# Patient Record
Sex: Female | Born: 1940 | Race: White | Hispanic: No | Smoking: Former smoker
Health system: Southern US, Community
[De-identification: ages and names within clinical notes are randomized; demographics above are authoritative.]

## PROBLEM LIST (undated history)

## (undated) ENCOUNTER — Emergency Department (HOSPITAL_COMMUNITY): Discharge status: Against Medical Advice | Payer: Medicare Other

## (undated) DIAGNOSIS — I499 Cardiac arrhythmia, unspecified: Secondary | ICD-10-CM

## (undated) DIAGNOSIS — N83209 Unspecified ovarian cyst, unspecified side: Secondary | ICD-10-CM

## (undated) DIAGNOSIS — C189 Malignant neoplasm of colon, unspecified: Secondary | ICD-10-CM

## (undated) DIAGNOSIS — F17201 Nicotine dependence, unspecified, in remission: Secondary | ICD-10-CM

## (undated) DIAGNOSIS — M1990 Unspecified osteoarthritis, unspecified site: Secondary | ICD-10-CM

## (undated) DIAGNOSIS — E785 Hyperlipidemia, unspecified: Secondary | ICD-10-CM

## (undated) DIAGNOSIS — K219 Gastro-esophageal reflux disease without esophagitis: Secondary | ICD-10-CM

## (undated) DIAGNOSIS — R112 Nausea with vomiting, unspecified: Secondary | ICD-10-CM

## (undated) DIAGNOSIS — K3184 Gastroparesis: Secondary | ICD-10-CM

## (undated) DIAGNOSIS — Z9889 Other specified postprocedural states: Secondary | ICD-10-CM

## (undated) HISTORY — DX: Hyperlipidemia, unspecified: E78.5

## (undated) HISTORY — DX: Paroxysmal atrial fibrillation: I48.0

## (undated) HISTORY — PX: CHOLECYSTECTOMY: SHX55

## (undated) HISTORY — DX: Gastroparesis: K31.84

## (undated) HISTORY — DX: Malignant neoplasm of colon, unspecified: C18.9

## (undated) HISTORY — PX: TONSILLECTOMY: SUR1361

## (undated) HISTORY — DX: Gastro-esophageal reflux disease without esophagitis: K21.9

## (undated) HISTORY — DX: Unspecified ovarian cyst, unspecified side: N83.209

## (undated) HISTORY — DX: Nicotine dependence, unspecified, in remission: F17.201

## (undated) HISTORY — PX: BACK SURGERY: SHX140

## (undated) HISTORY — DX: Cardiac arrhythmia, unspecified: I49.9

---

## 1981-05-11 HISTORY — PX: ABDOMINAL HYSTERECTOMY: SHX81

## 1997-10-16 ENCOUNTER — Inpatient Hospital Stay (HOSPITAL_COMMUNITY): Admission: EM | Admit: 1997-10-16 | Discharge: 1997-10-18 | Payer: Self-pay | Admitting: Emergency Medicine

## 2001-01-17 ENCOUNTER — Ambulatory Visit (HOSPITAL_COMMUNITY): Admission: RE | Admit: 2001-01-17 | Discharge: 2001-01-17 | Payer: Self-pay | Admitting: Internal Medicine

## 2001-03-10 ENCOUNTER — Ambulatory Visit (HOSPITAL_COMMUNITY): Admission: RE | Admit: 2001-03-10 | Discharge: 2001-03-10 | Payer: Self-pay | Admitting: Internal Medicine

## 2001-03-10 ENCOUNTER — Encounter: Payer: Self-pay | Admitting: Internal Medicine

## 2001-05-02 ENCOUNTER — Encounter: Payer: Self-pay | Admitting: Internal Medicine

## 2001-05-02 ENCOUNTER — Ambulatory Visit (HOSPITAL_COMMUNITY): Admission: RE | Admit: 2001-05-02 | Discharge: 2001-05-02 | Payer: Self-pay | Admitting: Internal Medicine

## 2001-11-24 ENCOUNTER — Observation Stay (HOSPITAL_COMMUNITY): Admission: EM | Admit: 2001-11-24 | Discharge: 2001-11-25 | Payer: Self-pay | Admitting: Cardiology

## 2001-11-24 ENCOUNTER — Encounter: Payer: Self-pay | Admitting: Emergency Medicine

## 2002-05-05 ENCOUNTER — Ambulatory Visit (HOSPITAL_COMMUNITY): Admission: RE | Admit: 2002-05-05 | Discharge: 2002-05-05 | Payer: Self-pay | Admitting: Internal Medicine

## 2002-05-05 ENCOUNTER — Encounter: Payer: Self-pay | Admitting: Internal Medicine

## 2002-07-07 ENCOUNTER — Ambulatory Visit (HOSPITAL_COMMUNITY): Admission: RE | Admit: 2002-07-07 | Discharge: 2002-07-07 | Payer: Self-pay | Admitting: Internal Medicine

## 2003-01-05 ENCOUNTER — Ambulatory Visit (HOSPITAL_COMMUNITY): Admission: RE | Admit: 2003-01-05 | Discharge: 2003-01-05 | Payer: Self-pay | Admitting: Internal Medicine

## 2003-01-05 ENCOUNTER — Encounter: Payer: Self-pay | Admitting: Internal Medicine

## 2003-05-07 ENCOUNTER — Ambulatory Visit (HOSPITAL_COMMUNITY): Admission: RE | Admit: 2003-05-07 | Discharge: 2003-05-07 | Payer: Self-pay | Admitting: Internal Medicine

## 2003-07-05 ENCOUNTER — Inpatient Hospital Stay (HOSPITAL_COMMUNITY): Admission: EM | Admit: 2003-07-05 | Discharge: 2003-07-06 | Payer: Self-pay | Admitting: Emergency Medicine

## 2003-07-05 ENCOUNTER — Encounter: Payer: Self-pay | Admitting: Internal Medicine

## 2003-08-28 ENCOUNTER — Observation Stay (HOSPITAL_COMMUNITY): Admission: AD | Admit: 2003-08-28 | Discharge: 2003-08-29 | Payer: Self-pay | Admitting: Cardiology

## 2003-08-28 ENCOUNTER — Encounter: Payer: Self-pay | Admitting: Emergency Medicine

## 2004-05-11 HISTORY — PX: PARTIAL COLECTOMY: SHX5273

## 2004-05-11 HISTORY — PX: COLON SURGERY: SHX602

## 2004-05-26 ENCOUNTER — Ambulatory Visit (HOSPITAL_COMMUNITY): Admission: RE | Admit: 2004-05-26 | Discharge: 2004-05-26 | Payer: Self-pay | Admitting: Internal Medicine

## 2004-12-11 ENCOUNTER — Ambulatory Visit: Payer: Self-pay | Admitting: Internal Medicine

## 2004-12-18 ENCOUNTER — Ambulatory Visit: Payer: Self-pay

## 2005-01-22 ENCOUNTER — Ambulatory Visit: Payer: Self-pay | Admitting: Cardiology

## 2005-03-05 ENCOUNTER — Ambulatory Visit (HOSPITAL_COMMUNITY): Admission: RE | Admit: 2005-03-05 | Discharge: 2005-03-05 | Payer: Self-pay | Admitting: Internal Medicine

## 2005-03-05 ENCOUNTER — Ambulatory Visit: Payer: Self-pay | Admitting: Internal Medicine

## 2005-03-06 ENCOUNTER — Encounter (INDEPENDENT_AMBULATORY_CARE_PROVIDER_SITE_OTHER): Payer: Self-pay | Admitting: Internal Medicine

## 2005-03-11 DIAGNOSIS — C189 Malignant neoplasm of colon, unspecified: Secondary | ICD-10-CM

## 2005-03-11 HISTORY — DX: Malignant neoplasm of colon, unspecified: C18.9

## 2005-03-27 ENCOUNTER — Inpatient Hospital Stay (HOSPITAL_COMMUNITY): Admission: RE | Admit: 2005-03-27 | Discharge: 2005-04-04 | Payer: Self-pay | Admitting: General Surgery

## 2005-03-27 ENCOUNTER — Encounter (INDEPENDENT_AMBULATORY_CARE_PROVIDER_SITE_OTHER): Payer: Self-pay | Admitting: General Surgery

## 2005-03-30 ENCOUNTER — Ambulatory Visit: Payer: Self-pay | Admitting: Cardiology

## 2005-05-29 ENCOUNTER — Ambulatory Visit (HOSPITAL_COMMUNITY): Admission: RE | Admit: 2005-05-29 | Discharge: 2005-05-29 | Payer: Self-pay | Admitting: Family Medicine

## 2005-07-06 ENCOUNTER — Ambulatory Visit (HOSPITAL_COMMUNITY): Admission: RE | Admit: 2005-07-06 | Discharge: 2005-07-06 | Payer: Self-pay | Admitting: Family Medicine

## 2005-07-07 ENCOUNTER — Ambulatory Visit (HOSPITAL_COMMUNITY): Admission: RE | Admit: 2005-07-07 | Discharge: 2005-07-07 | Payer: Self-pay | Admitting: General Surgery

## 2005-07-20 ENCOUNTER — Ambulatory Visit: Payer: Self-pay | Admitting: Cardiology

## 2005-11-16 ENCOUNTER — Observation Stay (HOSPITAL_COMMUNITY): Admission: EM | Admit: 2005-11-16 | Discharge: 2005-11-18 | Payer: Self-pay | Admitting: Emergency Medicine

## 2005-11-17 ENCOUNTER — Ambulatory Visit: Payer: Self-pay | Admitting: Cardiology

## 2005-11-26 ENCOUNTER — Encounter (HOSPITAL_COMMUNITY): Admission: RE | Admit: 2005-11-26 | Discharge: 2005-12-26 | Payer: Self-pay | Admitting: Cardiology

## 2005-11-26 ENCOUNTER — Ambulatory Visit: Payer: Self-pay | Admitting: *Deleted

## 2005-12-02 ENCOUNTER — Ambulatory Visit: Payer: Self-pay | Admitting: Internal Medicine

## 2005-12-29 ENCOUNTER — Ambulatory Visit: Payer: Self-pay | Admitting: Cardiology

## 2005-12-30 ENCOUNTER — Ambulatory Visit: Payer: Self-pay | Admitting: Internal Medicine

## 2006-03-01 ENCOUNTER — Ambulatory Visit: Payer: Self-pay | Admitting: Cardiology

## 2006-03-05 ENCOUNTER — Ambulatory Visit: Payer: Self-pay | Admitting: Internal Medicine

## 2006-03-05 ENCOUNTER — Ambulatory Visit (HOSPITAL_COMMUNITY): Admission: RE | Admit: 2006-03-05 | Discharge: 2006-03-05 | Payer: Self-pay | Admitting: Internal Medicine

## 2006-05-11 DIAGNOSIS — N83209 Unspecified ovarian cyst, unspecified side: Secondary | ICD-10-CM

## 2006-05-11 HISTORY — DX: Unspecified ovarian cyst, unspecified side: N83.209

## 2006-05-25 ENCOUNTER — Ambulatory Visit: Payer: Self-pay | Admitting: Internal Medicine

## 2006-05-31 ENCOUNTER — Ambulatory Visit (HOSPITAL_COMMUNITY): Admission: RE | Admit: 2006-05-31 | Discharge: 2006-05-31 | Payer: Self-pay | Admitting: Internal Medicine

## 2006-06-01 ENCOUNTER — Ambulatory Visit (HOSPITAL_COMMUNITY): Admission: RE | Admit: 2006-06-01 | Discharge: 2006-06-01 | Payer: Self-pay | Admitting: Family Medicine

## 2006-06-07 ENCOUNTER — Ambulatory Visit (HOSPITAL_COMMUNITY): Admission: RE | Admit: 2006-06-07 | Discharge: 2006-06-07 | Payer: Self-pay | Admitting: Internal Medicine

## 2006-08-16 ENCOUNTER — Observation Stay (HOSPITAL_COMMUNITY): Admission: AD | Admit: 2006-08-16 | Discharge: 2006-08-17 | Payer: Self-pay | Admitting: Family Medicine

## 2006-08-16 ENCOUNTER — Ambulatory Visit: Payer: Self-pay | Admitting: Cardiovascular Disease

## 2006-08-24 ENCOUNTER — Ambulatory Visit: Payer: Self-pay | Admitting: Cardiology

## 2006-12-07 ENCOUNTER — Ambulatory Visit (HOSPITAL_COMMUNITY): Admission: RE | Admit: 2006-12-07 | Discharge: 2006-12-07 | Payer: Self-pay | Admitting: Family Medicine

## 2006-12-15 ENCOUNTER — Ambulatory Visit (HOSPITAL_COMMUNITY): Admission: RE | Admit: 2006-12-15 | Discharge: 2006-12-15 | Payer: Self-pay | Admitting: Family Medicine

## 2007-02-25 ENCOUNTER — Ambulatory Visit: Payer: Self-pay | Admitting: Cardiology

## 2007-06-03 ENCOUNTER — Ambulatory Visit (HOSPITAL_COMMUNITY): Admission: RE | Admit: 2007-06-03 | Discharge: 2007-06-03 | Payer: Self-pay | Admitting: Family Medicine

## 2007-07-27 ENCOUNTER — Ambulatory Visit (HOSPITAL_COMMUNITY): Admission: RE | Admit: 2007-07-27 | Discharge: 2007-07-27 | Payer: Self-pay | Admitting: Family Medicine

## 2007-09-09 HISTORY — PX: COSMETIC SURGERY: SHX468

## 2007-09-19 ENCOUNTER — Ambulatory Visit: Payer: Self-pay | Admitting: Cardiology

## 2007-09-26 ENCOUNTER — Ambulatory Visit (HOSPITAL_COMMUNITY): Admission: RE | Admit: 2007-09-26 | Discharge: 2007-09-26 | Payer: Self-pay | Admitting: Family Medicine

## 2007-11-08 ENCOUNTER — Inpatient Hospital Stay (HOSPITAL_COMMUNITY): Admission: EM | Admit: 2007-11-08 | Discharge: 2007-11-13 | Payer: Self-pay | Admitting: Emergency Medicine

## 2008-06-04 ENCOUNTER — Ambulatory Visit (HOSPITAL_COMMUNITY): Admission: RE | Admit: 2008-06-04 | Discharge: 2008-06-04 | Payer: Self-pay | Admitting: Family Medicine

## 2008-06-19 ENCOUNTER — Ambulatory Visit: Payer: Self-pay | Admitting: Cardiology

## 2009-03-14 ENCOUNTER — Ambulatory Visit (HOSPITAL_COMMUNITY): Admission: RE | Admit: 2009-03-14 | Discharge: 2009-03-14 | Payer: Self-pay | Admitting: Family Medicine

## 2009-03-14 ENCOUNTER — Encounter (INDEPENDENT_AMBULATORY_CARE_PROVIDER_SITE_OTHER): Payer: Self-pay | Admitting: *Deleted

## 2009-03-14 LAB — CONVERTED CEMR LAB
AST: 20 units/L
Albumin: 4.6 g/dL
BUN: 20 mg/dL
CO2: 25 meq/L
Calcium: 9.4 mg/dL
Chloride: 105 meq/L
Creatinine, Ser: 0.74 mg/dL
Glucose, Bld: 97 mg/dL
HDL: 56 mg/dL
Potassium: 4.2 meq/L
TSH: 1.43 microintl units/mL

## 2009-05-09 ENCOUNTER — Encounter (INDEPENDENT_AMBULATORY_CARE_PROVIDER_SITE_OTHER): Payer: Self-pay | Admitting: *Deleted

## 2009-05-09 LAB — CONVERTED CEMR LAB
ALT: 9 units/L
AST: 24 units/L
Alkaline Phosphatase: 64 units/L
BUN: 15 mg/dL
Calcium: 9.4 mg/dL
Cholesterol: 224 mg/dL
Creatinine, Ser: 0.79 mg/dL
HDL: 71 mg/dL
LDL Cholesterol: 112 mg/dL
Triglycerides: 207 mg/dL

## 2009-05-29 ENCOUNTER — Encounter: Payer: Self-pay | Admitting: Cardiology

## 2009-06-07 ENCOUNTER — Ambulatory Visit (HOSPITAL_COMMUNITY): Admission: RE | Admit: 2009-06-07 | Discharge: 2009-06-07 | Payer: Self-pay | Admitting: Family Medicine

## 2009-06-12 ENCOUNTER — Encounter (INDEPENDENT_AMBULATORY_CARE_PROVIDER_SITE_OTHER): Payer: Self-pay | Admitting: *Deleted

## 2009-06-12 DIAGNOSIS — E785 Hyperlipidemia, unspecified: Secondary | ICD-10-CM

## 2009-06-24 ENCOUNTER — Ambulatory Visit: Payer: Self-pay | Admitting: Cardiology

## 2009-06-24 DIAGNOSIS — K219 Gastro-esophageal reflux disease without esophagitis: Secondary | ICD-10-CM

## 2009-06-24 DIAGNOSIS — R079 Chest pain, unspecified: Secondary | ICD-10-CM | POA: Insufficient documentation

## 2009-06-24 DIAGNOSIS — C189 Malignant neoplasm of colon, unspecified: Secondary | ICD-10-CM | POA: Insufficient documentation

## 2009-06-25 ENCOUNTER — Encounter: Payer: Self-pay | Admitting: Cardiology

## 2009-08-05 ENCOUNTER — Encounter: Payer: Self-pay | Admitting: Cardiology

## 2009-08-08 ENCOUNTER — Telehealth: Payer: Self-pay | Admitting: Cardiology

## 2009-08-14 ENCOUNTER — Encounter: Payer: Self-pay | Admitting: Cardiology

## 2009-08-22 ENCOUNTER — Encounter (INDEPENDENT_AMBULATORY_CARE_PROVIDER_SITE_OTHER): Payer: Self-pay | Admitting: *Deleted

## 2009-09-16 ENCOUNTER — Ambulatory Visit (HOSPITAL_COMMUNITY): Admission: RE | Admit: 2009-09-16 | Discharge: 2009-09-16 | Payer: Self-pay | Admitting: Family Medicine

## 2009-09-17 ENCOUNTER — Encounter: Payer: Self-pay | Admitting: Cardiology

## 2009-10-09 ENCOUNTER — Ambulatory Visit: Payer: Self-pay | Admitting: Cardiology

## 2010-01-23 ENCOUNTER — Encounter: Payer: Self-pay | Admitting: Cardiology

## 2010-01-23 LAB — CONVERTED CEMR LAB
ALT: 19 units/L
AST: 21 units/L
Albumin: 4.4 g/dL
Alkaline Phosphatase: 71 units/L
HDL: 56 mg/dL
LDL Cholesterol: 109 mg/dL

## 2010-03-27 ENCOUNTER — Ambulatory Visit (HOSPITAL_COMMUNITY)
Admission: RE | Admit: 2010-03-27 | Discharge: 2010-03-27 | Payer: Self-pay | Source: Home / Self Care | Admitting: Family Medicine

## 2010-04-15 ENCOUNTER — Encounter (INDEPENDENT_AMBULATORY_CARE_PROVIDER_SITE_OTHER): Payer: Self-pay | Admitting: *Deleted

## 2010-04-15 LAB — CONVERTED CEMR LAB
CO2: 24 meq/L
Calcium: 9.6 mg/dL
Chloride: 101 meq/L
Creatinine, Ser: 0.79 mg/dL
Glucose, Bld: 91 mg/dL

## 2010-06-09 ENCOUNTER — Ambulatory Visit (HOSPITAL_COMMUNITY)
Admission: RE | Admit: 2010-06-09 | Discharge: 2010-06-09 | Payer: Self-pay | Source: Home / Self Care | Attending: Family Medicine | Admitting: Family Medicine

## 2010-06-10 NOTE — Miscellaneous (Signed)
Summary: LABS BMP,LIPIDS,LIVER 03/14/2009  Clinical Lists Changes  Observations: Added new observation of CALCIUM: 9.4 mg/dL (81/19/1478 29:56) Added new observation of ALBUMIN: 4.6 g/dL (21/30/8657 84:69) Added new observation of PROTEIN, TOT: 7.0 g/dL (62/95/2841 32:44) Added new observation of SGPT (ALT): 16 units/L (03/14/2009 15:45) Added new observation of SGOT (AST): 20 units/L (03/14/2009 15:45) Added new observation of ALK PHOS: 74 units/L (03/14/2009 15:45) Added new observation of BILI DIRECT: <0.1 mg/dL (05/13/7251 66:44) Added new observation of CREATININE: 0.74 mg/dL (03/47/4259 56:38) Added new observation of BUN: 20 mg/dL (75/64/3329 51:88) Added new observation of BG RANDOM: 97 mg/dL (41/66/0630 16:01) Added new observation of CO2 PLSM/SER: 25 meq/L (03/14/2009 15:45) Added new observation of CL SERUM: 105 meq/L (03/14/2009 15:45) Added new observation of K SERUM: 4.2 meq/L (03/14/2009 15:45) Added new observation of NA: 143 meq/L (03/14/2009 15:45) Added new observation of LDL: 90 mg/dL (09/32/3557 32:20) Added new observation of HDL: 56 mg/dL (25/42/7062 37:62) Added new observation of TRIGLYC TOT: 198 mg/dL (83/15/1761 60:73) Added new observation of CHOLESTEROL: 186 mg/dL (71/10/2692 85:46) Added new observation of TSH: 1.430 microintl units/mL (03/14/2009 15:45)

## 2010-06-10 NOTE — Miscellaneous (Signed)
Summary: CMP,LIPIDS  Clinical Lists Changes  Observations: Added new observation of CALCIUM: 9.4 mg/dL (82/95/6213 08:65) Added new observation of ALBUMIN: 4.1 g/dL (78/46/9629 52:84) Added new observation of PROTEIN, TOT: 6.9 g/dL (13/24/4010 27:25) Added new observation of SGPT (ALT): 9 units/L (05/09/2009 16:38) Added new observation of SGOT (AST): 24 units/L (05/09/2009 16:38) Added new observation of ALK PHOS: 64 units/L (05/09/2009 16:38) Added new observation of BILI DIRECT: total bili   0.4 mg/dL (36/64/4034 74:25) Added new observation of CREATININE: 0.79 mg/dL (95/63/8756 43:32) Added new observation of BUN: 15 mg/dL (95/18/8416 60:63) Added new observation of BG RANDOM: 107 mg/dL (01/60/1093 23:55) Added new observation of CO2 PLSM/SER: 31 meq/L (05/09/2009 16:38) Added new observation of CL SERUM: 99 meq/L (05/09/2009 16:38) Added new observation of K SERUM: 4.1 meq/L (05/09/2009 16:38) Added new observation of NA: 142 meq/L (05/09/2009 16:38) Added new observation of LDL: 112 mg/dL (73/22/0254 27:06) Added new observation of HDL: 71 mg/dL (23/76/2831 51:76) Added new observation of TRIGLYC TOT: 207 mg/dL (16/11/3708 62:69) Added new observation of CHOLESTEROL: 224 mg/dL (48/54/6270 35:00)

## 2010-06-10 NOTE — Miscellaneous (Signed)
Summary: flecanide refill  Clinical Lists Changes  Medications: Rx of FLECAINIDE ACETATE 100 MG TABS (FLECAINIDE ACETATE) Take one tablet by mouth every 12 hours;  #60 x 6;  Signed;  Entered by: Teressa Lower RN;  Authorized by: Kathlen Brunswick, MD, [facility];  Method used: Electronically to [facility]*, [address] [place], [place], [place]  [address], Ph: [phone] or [phone], Fax: [phone]    Prescriptions: FLECAINIDE ACETATE 100 MG TABS (FLECAINIDE ACETATE) Take one tablet by mouth every 12 hours  #60 x 6   Entered by:   Teressa Lower RN   Authorized by:   Kathlen Brunswick, MD, [facility]   Signed by:   Teressa Lower RN on 08/05/2009   Method used:   Electronically to        [facility]* (retail)       [address]       [place], [place]  [address]       Ph: [phone] or [phone]       Fax: [phone]   RxID:   [id number]

## 2010-06-10 NOTE — Assessment & Plan Note (Signed)
Summary: 1 yr fu per checkout on 06/19/08/tg  Medications Added FISH OIL 1360 MG CAPS (OMEGA-3 FATTY ACIDS) take 1 tab daily      Allergies Added: NKDA  Visit Type:  Follow-up Referring Provider:  EP-Dr. Graciela Husbands; GI-Rehman;Surg.-Barber Primary Provider:  Dr.Scott Luking   History of Present Illness: Return visit for this very pleasant 70 year old woman with paroxysmal atrial fibrillation.  Since her last visit, she has had only occasional and brief palpitations.  She has seen advertisements for anticoagulants and wonders if she should be taking something to prevent stroke.  She has no dyspnea nor chest discomfort.  She has no claudication.  She has had no new medical problems nor required urgent medical care.  EKG  Procedure date:  06/24/2009  Findings:      Normal sinus rhythm Left atrial abnormality Incomplete right bundle branch block Delayed R-wave progression No significant change when compared to a previous tracing of 06/19/2008.   Current Medications (verified): 1)  Flecainide Acetate 100 Mg Tabs (Flecainide Acetate) .... Take One Tablet By Mouth Every 12 Hours 2)  Aspir-Trin 325 Mg Tbec (Aspirin) .... Take 1 Tab Daily 3)  Lipitor 40 Mg Tabs (Atorvastatin Calcium) .... Take 1 Tab Daily 4)  Daily-Vitamin  Tabs (Multiple Vitamin) .... Take 1 Tab Daily 5)  Caltrate 600 1500 Mg Tabs (Calcium Carbonate) .... Take 1 Tab Daily 6)  Lactulose  Powd (Lactulose) .... Take As Needed 7)  Fish Oil 1360 Mg Caps (Omega-3 Fatty Acids) .... Take 1 Tab Daily  Allergies (verified): No Known Drug Allergies  Past History:  PMH, FH, and Social History reviewed and updated.  Review of Systems       see history of present illness  Vital Signs:  Patient profile:   70 year old female Height:      62 inches Weight:      114 pounds BMI:     20.93 Pulse rate:   67 / minute BP sitting:   110 / 60  (right arm)  Vitals Entered By: Dreama Saa, CNA (June 24, 2009 1:47 PM)  Physical  Exam  General:  Pleasant, thin, woman in no acute distress. NECK:  No jugular venous distention; normal carotid upstrokes without bruits. LUNGS:  Clear. CARDIAC:  Normal first and second heart sounds; S4 present ABDOMEN:  Soft and nontender; no organomegaly. Neurologic: Normal cranial nerves; symmetric strength and tone Psychiatric: Alert and oriented; normal affect Skin: No significant lesions EXTREMITIES:  No edema; 1+ posterior tibial pulses; dorsalis pedis pulses cannot be located with Doppler nor palpated.   Impression & Recommendations:  Problem # 1:  PAROXYSMAL ATRIAL FIBRILLATION (ICD-427.31) No documented recurrent atrial fibrillation.  I explained to the patient that she appears to have no arrhythmia whatsoever, but that asymptomatic atrial fibrillation is possible.  Even if she is experiencing that arrhythmia, she has a low risk for thromboembolism and does not want treatment with anticoagulants.  Flecainide level was therapeutic at her last visit.  Her current dose appears effective and will be continued.  Problem # 2:  HYPERLIPIDEMIA (ICD-272.4) Recent lipid profile was fairly good; moderate dose atorvastatin will be continued.  Problem # 3:  CHEST PAIN (ICD-786.50) No recurrent chest discomfort; coronaries were normal at angiography in 1999.  Stress nuclear study was normal in 2007.  No further testing or treatment warranted at the present time.  Patient Instructions: 1)  Your physician recommends that you schedule a follow-up appointment in: 1 year

## 2010-06-10 NOTE — Letter (Signed)
Summary: [place] Results Engineer, agricultural at [facility]  [address], [place] [address]   Phone: [phone]  Fax: [phone]      August 22, 2009 MRN: [id number]   [facility] [address] [place], [place]  [address]   Dear Ms. Brisbane,  Your test ordered by Selena Batten has been reviewed by your physician (or physician assistant) and was found to be normal or stable. Your physician (or physician assistant) felt no changes were needed at this time.  ____ Echocardiogram  ____ Cardiac Stress Test  _x___ Lab Work  ____ Peripheral vascular study of arms, legs or neck  ____ CT scan or X-ray  ____ Lung or Breathing test  ____ Other:  No change in medical treatment at this time, per Dr. Dietrich Pates.  Enclosed is a copy of your labwork for your records.  Thank you, Destyne Goodreau Allyne Gee RN    [place] Bing, MD, [facility].C.Gaylord Shih, MD, F.A.C.C Lewayne Bunting, MD, F.A.C.C Nona Dell, MD, F.A.C.C Charlton Haws, MD, [facility] Arena.C.C

## 2010-06-10 NOTE — Progress Notes (Signed)
Summary: pt having problems   Phone Note Call from Patient   Caller: Patient Reason for Call: Talk to Nurse Summary of Call: pt states she has been having some irregular heartbeats/wants to be seen by Dr.Lakyn Mantione/told her nurse would call to assess her issues/tg Initial call taken by: Raechel Ache [facility],  August 08, 2009 2:28 PM  Follow-up for Phone Call        Pt. states that she normally has her Flecainide level checked with each ov but not on ov of  06-24-09. Takes Flecainide 100mg  q 12 hrs. She c/o irregular HB that occurs 3 to 4 times a week and wants to know if she needs that lab work performed or make an appt. to see Dr. Dietrich Pates. Please advise. Follow-up by: Larita Fife Via LPN,  August 09, 2009 9:08 AM  Additional Follow-up for Phone Call Additional follow up Details #1::        Flecainide level is not necessary at every visit, but is now that she symptomatic. Please obtain a level and provide her with a recorder for 3 weeks.  Schedule an appointment in one to 2 months. Additional Follow-up by: Kathlen Brunswick, MD, [facility],  August 13, 2009 8:43 AM    Additional Follow-up for Phone Call Additional follow up Details #2::    Pt. advised to have Flecainide level drawn tomorrow morning, that she will receive Event monitor in mail and scheduled appt. with Dr. Dietrich Pates on 10-09-09 @ 2:45pm. Pt. states she understands info. given. Follow-up by: Larita Fife Via LPN,  August 13, 2009 2:11 PM

## 2010-06-10 NOTE — Miscellaneous (Signed)
Summary: flecanide refill  Clinical Lists Changes  Medications: Added new medication of FLECAINIDE ACETATE 100 MG TABS (FLECAINIDE ACETATE) Take one tablet by mouth every 12 hours - Signed Rx of FLECAINIDE ACETATE 100 MG TABS (FLECAINIDE ACETATE) Take one tablet by mouth every 12 hours;  #60 x 1;  Signed;  Entered by: Teressa Lower RN;  Authorized by: Kathlen Brunswick, MD, [facility];  Method used: Electronically to [facility]*, [address] [place], [place], [place]  [address], Ph: [phone] or [phone], Fax: [phone]    Prescriptions: FLECAINIDE ACETATE 100 MG TABS (FLECAINIDE ACETATE) Take one tablet by mouth every 12 hours  #60 x 1   Entered by:   Teressa Lower RN   Authorized by:   Kathlen Brunswick, MD, [facility]   Signed by:   Teressa Lower RN on 05/29/2009   Method used:   Electronically to        [facility]* (retail)       [address]       [place], [place]  [address]       Ph: [phone] or [phone]       Fax: [phone]   RxID:   [id number]

## 2010-06-10 NOTE — Assessment & Plan Note (Signed)
Summary: 1-2 mth f/u per Larita Fife per phone call on 08/13/09/tg  Medications Added DEXILANT 60 MG CPDR (DEXLANSOPRAZOLE) take 1 tab daily      Allergies Added: NKDA  Referring Provider:  EP-Dr. Graciela Husbands; GI-Rehman;Surg.-Barber Primary Provider:  Dr.Scott Luking   History of Present Illness: [name] returns to the office following a telephone call a few months ago to report a marked increase in the frequency and intensity of palpitations.  An event recorder revealed no significant arrhythmias.  This finding may not be very informative in that the patient's symptoms also resolved at the time the event recorder was applied.  Since its removal, she has had only a few brief episodes of palpitations.  She reports no change in medications or in diet.  She has had substantial stress of late due to family issues.  She has had no chest discomfort, orthopnea nor PND.  She is fearful of CVA, since her mother died following a stroke, and asked a number of questions about the role of antithrombotic and anticoagulant agents in atrial fibrillation.    Current Medications (verified): 1)  Flecainide Acetate 100 Mg Tabs (Flecainide Acetate) .... Take One Tablet By Mouth Every 12 Hours 2)  Aspir-Trin 325 Mg Tbec (Aspirin) .... Take 1 Tab Daily 3)  Lipitor 40 Mg Tabs (Atorvastatin Calcium) .... Take 1 Tab Daily 4)  Daily-Vitamin  Tabs (Multiple Vitamin) .... Take 1 Tab Daily 5)  Caltrate 600 1500 Mg Tabs (Calcium Carbonate) .... Take 1 Tab Daily 6)  Lactulose  Powd (Lactulose) .... Take As Needed 7)  Fish Oil 1360 Mg Caps (Omega-3 Fatty Acids) .... Take 1 Tab Daily 8)  Dexilant 60 Mg Cpdr (Dexlansoprazole) .... Take 1 Tab Daily  Allergies (verified): No Known Drug Allergies  Past History:  PMH, FH, and Social History reviewed and updated.  Review of Systems       See history of present illness.  Vital Signs:  Patient profile:   70 year old female Weight:      113 pounds Pulse rate:   63 / minute BP  sitting:   100 / 59  (right arm)  Vitals Entered By: Dreama Saa, CNA (October 09, 2009 2:49 PM)  Physical Exam  General:  Pleasant, anxious, thin, woman in no acute distress. NECK:  No jugular venous distention; normal carotid upstrokes without bruits. LUNGS:  Clear. CARDIAC:  Normal first and second heart sounds; S4 present; grade 1/6 systolic ejection murmur at the cardiac base ABDOMEN:  Soft and nontender; no organomegaly. Neurologic: Normal cranial nerves; symmetric strength and tone Psychiatric: Alert and oriented; normal affect Skin: No significant lesions EXTREMITIES:  No edema; 1+ posterior tibial pulses; dorsalis pedis pulses cannot be located with Doppler nor palpated. Head:     Impression & Recommendations:  Problem # 1:  PAROXYSMAL ATRIAL FIBRILLATION (ICD-427.31) [name] is doing generally well with apparent occasional mild exacerbations of PAF.  A recent flecainide level was therapeutic at 0.44.  She has had no prolonged episodes and has no risk factors for thromboembolism other than her age, which is intermediate and her gender.  She wonders if aspirin dosage should be decreased to 81 mg q.d.  I told her this was acceptable, but the best studies were done with 325 mg.  She agrees to continue the higher dose.  I will see this pleasant woman again as planned in February of next year.  Problem # 2:  CHEST PAIN (ICD-786.50) Symptoms have resolved, at least for the time  being.  Problem # 3:  HYPERLIPIDEMIA (ICD-272.4) Most recent profile shows total cholesterol 224, triglycerides of 207, HDL of 71 and LDL of 112.  In the absence of known vascular disease, this represents adequate control.

## 2010-06-13 ENCOUNTER — Encounter (INDEPENDENT_AMBULATORY_CARE_PROVIDER_SITE_OTHER): Payer: Self-pay | Admitting: *Deleted

## 2010-06-18 ENCOUNTER — Encounter: Payer: Self-pay | Admitting: Cardiology

## 2010-06-18 ENCOUNTER — Ambulatory Visit (INDEPENDENT_AMBULATORY_CARE_PROVIDER_SITE_OTHER): Payer: Medicare Other | Admitting: Cardiology

## 2010-06-18 DIAGNOSIS — I4891 Unspecified atrial fibrillation: Secondary | ICD-10-CM

## 2010-06-18 DIAGNOSIS — E782 Mixed hyperlipidemia: Secondary | ICD-10-CM

## 2010-06-18 NOTE — Miscellaneous (Signed)
Summary: LABS BMP 04/15/2010  Clinical Lists Changes  Observations: Added new observation of CALCIUM: 9.6 mg/dL (40/98/1191 47:82) Added new observation of CREATININE: 0.79 mg/dL (95/62/1308 65:78) Added new observation of BUN: 23 mg/dL (46/96/2952 84:13) Added new observation of BG RANDOM: 91 mg/dL (24/40/1027 25:36) Added new observation of CO2 PLSM/SER: 24 meq/L (04/15/2010 11:45) Added new observation of CL SERUM: 101 meq/L (04/15/2010 11:45) Added new observation of K SERUM: 4.7 meq/L (04/15/2010 11:45) Added new observation of NA: 137 meq/L (04/15/2010 11:45)

## 2010-07-02 NOTE — Assessment & Plan Note (Signed)
Summary: 1 yr. fu from check out on 06/24/09/sn/lv  Medications Added FLECAINIDE ACETATE 100 MG TABS (FLECAINIDE ACETATE) Take one tablet by mouth every 12 hours RA KRILL OIL 500 MG CAPS (KRILL OIL) take 1 tab daily FENOFIBRATE 160 MG TABS (FENOFIBRATE) Take one tablet by mouth daily with a meal      Allergies Added: NKDA  Visit Type:  Follow-up Referring Provider:  EP-Dr. Graciela Husbands; GI-Rehman;Surg.-Barber Primary Provider:  Dr.Scott Luking   History of Present Illness: [name] returns to the office for continued assessment and treatment of paroxysmal atrial fibrillation.  Since her last visit, she has done superbly.  She denies chest discomfort, dyspnea, lightheadedness, syncope, palpitations or exercise intolerance.  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  EKG  Procedure date:  06/18/2010  Findings:      Normal sinus rhythm Borderline left atrial abnormality Right bundle branch block Delayed R-wave progression Otherwise normal including normal QT interval No significant change compared with 06/19/08   Current Medications (verified): 1)  Flecainide Acetate 100 Mg Tabs (Flecainide Acetate) .... Take One Tablet By Mouth Every 12 Hours 2)  Aspir-Trin 325 Mg Tbec (Aspirin) .... Take 1 Tab Daily 3)  Lipitor 40 Mg Tabs (Atorvastatin Calcium) .... Take 1 Tab Daily 4)  Daily-Vitamin  Tabs (Multiple Vitamin) .... Take 1 Tab Daily 5)  Caltrate 600 1500 Mg Tabs (Calcium Carbonate) .... Take 1 Tab Daily 6)  Lactulose  Powd (Lactulose) .... Take As Needed 7)  Fish Oil 1360 Mg Caps (Omega-3 Fatty Acids) .... Take 1 Tab Daily 8)  Dexilant 60 Mg Cpdr (Dexlansoprazole) .... Take 1 Tab Daily 9)  Ra Krill Oil 500 Mg Caps (Krill Oil) .... Take 1 Tab Daily  Allergies (verified): No Known Drug Allergies  Comments:  Nurse/Medical Assistant: patient brought meds we reviewed also from last ov she  uses [facility]  Past History:  PMH, FH, and Social History reviewed and updated.  Past Medical History: Paroxysmal atrial fibrillation-palpitations; maintained on flecainide Tobacco abuse-less than 10-pack-years; quit in 2001 Chest pain-normal coronary angiography and 1999; negative stress nuclear study in 11/2005 Adenocarcinoma, colon(Stage I)-resected in 03/2005 Gastroesophageal reflux disease Hyperlipidemia Ovarian cyst-2008  Review of Systems       See history of present illness.  Vital Signs:  Patient profile:   70 year old female Weight:      111 pounds BMI:     20.38 O2 Sat:      97 % on Room air Pulse rate:   67 / minute BP sitting:   112 / 60  (left arm)  Vitals Entered By: Dreama Saa, CNA (June 18, 2010 11:23 AM)  O2 Flow:  Room air  Physical Exam  General:  Pleasant, anxious, thin, woman in no acute distress. NECK:  No jugular venous distention; normal carotid upstrokes without bruits. LUNGS:  Clear. CARDIAC:  Normal first and second heart sounds; S4 present; ABDOMEN:  Soft and nontender; no organomegaly. Neurologic: Normal cranial nerves; symmetric strength and tone Psychiatric: Alert and oriented; normal affect Skin: No significant lesions EXTREMITIES:  No edema; 1+ posterior tibial pulses; dorsalis pedis pulses cannot be located with Doppler nor palpated.   Impression & Recommendations:  Problem # 1:  PAROXYSMAL ATRIAL FIBRILLATION (ICD-427.31) Arrhythmia appears well controlled with flecainide; a serum level at trough will be obtained.  Problem # 2:  HYPERLIPIDEMIA (ICD-272.4) Treatment for hyperlipidemia is monitored by Dr. Gerda Diss.  We  have requested the results of recent laboratory studies.  Problem # 3:  GASTROESOPHAGEAL REFLUX DISEASE (ICD-530.81) Symptoms are well-controlled with dexilant, although other PPIs have been ineffective in the past.  We could consider reducing her dose of aspirin, but I doubt that would have a  significant effect on this disorder, which is now well-controlled.  Other Orders: Future Orders: T- * Misc. Laboratory test [phone]) ... 06/19/2010  Patient Instructions: 1)  Your physician recommends that you schedule a follow-up appointment in: 1 YEAR 2)  Your physician recommends that you return for lab work in: TODAY

## 2010-09-23 ENCOUNTER — Other Ambulatory Visit (INDEPENDENT_AMBULATORY_CARE_PROVIDER_SITE_OTHER): Payer: Self-pay | Admitting: Internal Medicine

## 2010-09-23 ENCOUNTER — Ambulatory Visit (INDEPENDENT_AMBULATORY_CARE_PROVIDER_SITE_OTHER): Payer: Medicare Other | Admitting: Internal Medicine

## 2010-09-23 DIAGNOSIS — R112 Nausea with vomiting, unspecified: Secondary | ICD-10-CM

## 2010-09-23 DIAGNOSIS — Z85028 Personal history of other malignant neoplasm of stomach: Secondary | ICD-10-CM

## 2010-09-23 DIAGNOSIS — R110 Nausea: Secondary | ICD-10-CM

## 2010-09-23 DIAGNOSIS — R1110 Vomiting, unspecified: Secondary | ICD-10-CM

## 2010-09-23 DIAGNOSIS — R109 Unspecified abdominal pain: Secondary | ICD-10-CM

## 2010-09-23 NOTE — Letter (Signed)
June 19, 2008    [place], [place]  [name] [address] [name], [place] [address]   RE:  Lisa, [name]  MRN:  [id number]  /  DOB:  03/10/1941   Dear [name],   [name] returns to the office as scheduled for continued assessment  and treatment of paroxysmal atrial fibrillation.  Since her last visit,  she has done exceedingly well.  She notes no palpitations, dyspnea, nor  chest discomfort.  She underwent plastic surgery in mid 2009 without  complications.  She has developed no new intercurrent medical problems.   CURRENT MEDICATIONS:  1. Aspirin 325 mg daily.  2. Atorvastatin 40 mg daily.  3. Flecainide 100 mg b.i.d.  4. Zegerid 40/100 mg daily.  5. Lactulose 1 tablespoon nightly.   PHYSICAL EXAMINATION:  GENERAL:  Pleasant, thin, woman in no acute  distress.  VITAL SIGNS:  The weight is 114, unchanged.  Blood pressure 110/60,  heart rate 65 and regular, respirations 12 and unlabored.  NECK:  No jugular venous distention; normal carotid upstrokes without  bruits.  LUNGS:  Clear.  CARDIAC:  Normal first and second heart sounds; modest systolic murmur.  ABDOMEN:  Soft and nontender; no organomegaly.  EXTREMITIES:  No edema; normal distal pulses.   EKG:  Normal sinus rhythm; incomplete right bundle-branch block; delayed  R-wave progression.  When compared to a previous tracing of Sep 19, 2007, the only change is the presence of a left atrial abnormality on  the current tracing.   Most recent lipid profile was obtained in November.  Total cholesterol  was 221, triglycerides 174, HDL 65 and LDL 121.  At that point,  atorvastatin was increased to 40 mg from 20 mg.   IMPRESSION:  [name] is doing quite well overall from a cardiac  standpoint.  We will check a flecainide level, which was low  therapeutic, when last assessed 2 years ago.  If results are good, I  will plan to see this nice woman again in 1 year.  She will call me, if  palpitations recurred.    Sincerely,      Gerrit Friends. Dietrich Pates, MD, [facility]  Electronically Signed    RMR/MedQ  DD: 06/19/2008  DT: 06/20/2008  Job #: [id number]

## 2010-09-23 NOTE — H&P (Signed)
NAMESHEILA, [name]                 ACCOUNT NO.:  [id number]   MEDICAL RECORD NO.:  [id number]          PATIENT TYPE:  INP   LOCATION:  A325                          FACILITY:  [facility]   PHYSICIAN:  Donna Bernard, M.D.DATE OF BIRTH:  1941-04-06   DATE OF ADMISSION:  DATE OF DISCHARGE:  LH                              HISTORY & PHYSICAL   CHIEF COMPLAINT:  Abdominal pain.   SUBJECTIVE:  This patient is a 70 year old white female with a history  of prior colon cancer surgery partial resection December 2006,  hyperlipidemia, reflux, and prior bouts of atrial fibrillation who  arrived to the emergency room on the day of admission with complaints of  pain.  The patient notes all day along she had midepigastric and mid  umbilical pain very crampy in nature was associated with nausea.  No  vomiting and pain was very severe at times she took anything she could  find over-the-counter Tums and Maalox etc. without any benefit.  Further  history, she has had several similar bouts in the past year they have  all lasted since few hours and deceased, this one came instead the  patient went on to the emergency room.   PAST SURGERIES:  Remote cholecystectomy, remote hysterectomy,  tonsillectomy, December 2006, partial colon resection for colon cancer.  She has since had a colonoscopy a year later which was read as negative.  Last year, she also had a Cardiolite which was negative.   FAMILY HISTORY:  Positive for colon cancer, hypertension, coronary  artery disease, and hyperlipidemia.   SOCIAL HISTORY:  The patient is retired, married, and smoked up until  40.   ALLERGIES:  None known.   REVIEW OF SYSTEMS:  Does include frequent reflux, otherwise negative.   PHYSICAL EXAMINATION:  VITAL SIGNS:  Blood pressure 136/80, afebrile,  pulse rate 100, respiratory rate 18, alert, holding the abdomen in mild  distress.  HEENT:  Normal.  NECK:  Supple.  LUNGS:  Clear.  HEART:  Regular rate and  rhythm, currently no significant murmurs.  ABDOMEN:  Bowel sounds present.  Diffuse mild tenderness midepigastric  region primarily slight bloating per patient, but not particularly  appreciable on exam.  EXTREMITIES:  Thin.  No edema.  NEURO:  Intact.   LABORATORY DATA:  Significant labs, CBC, hemoglobin 13.9, white blood  count 11.9, MET-7 showed all within normal limits for a nonfasting  individual.  Upright abdomen revealed numerous loops and air-fluid  levels and small bowel.   IMPRESSION:  1. Small bowel obstruction, partial in nature, likely with recurrent      suspend nondiagnosed up until now with major gastrointestinal      surgery, here in the last couple years that is likely etiology.  2. History of atrial fibrillation.  3. Hyperlipidemia.  4. Status post colon cancer.  5. Reflux.   PLAN:  IV fluids, NG tube towards suction, morphine for pain, Zofran for  nausea, surgery consultation further orders as noted.      Donna Bernard, M.D.  Electronically Signed     WSL/MEDQ  D:  11/09/2007  T:  11/10/2007  Job:  [id number]

## 2010-09-23 NOTE — H&P (Signed)
[name], [name]                 ACCOUNT NO.:  [id number]   MEDICAL RECORD NO.:  [id number]          PATIENT TYPE:  INP   LOCATION:  A325                          FACILITY:  [facility]   PHYSICIAN:  Donna Bernard, M.D.DATE OF BIRTH:  1941-02-07   DATE OF ADMISSION:  DATE OF DISCHARGE:  LH                              HISTORY & PHYSICAL   ADDENDUM:   CHRONIC MEDICATIONS:  1. Lipitor 20 mg p.o. nightly.  2. Centrum Silver 1 daily.  3. Flecainide 100 mg 1 p.o. b.i.d.  4. Zegerid 40 mg b.i.d.      Donna Bernard, M.D.  Electronically Signed     WSL/MEDQ  D:  11/09/2007  T:  11/10/2007  Job:  [id number]

## 2010-09-23 NOTE — Letter (Signed)
February 25, 2007    Corrie Mckusick, M.D.  [address] [place]. [place], [place] [address]   RE:  JARELIS, EHLERT  MRN:  [id number]  /  DOB:  [birth year]   Dear Vonna Kotyk:   Ms. Salata returns to the office for continued assessment and treatment  of paroxysmal atrial fibrillation.  Since her last visit, she has done  generally well.  She continues to experience brief episodes of  palpitations that last a matter of minutes to no more than 1 hour.  She  has no associated symptoms during these events.  She has had no chest  pain nor dyspnea.  She was recently told of cysts on her ovaries and  hepatic cysts.  These are being followed with frequent ultrasound  examinations.  She also notes a nodule on the palm of her right hand.   CURRENT MEDICATIONS:  1. Aspirin 325 mg daily.  2. Atorvastatin 20 mg daily.  3. Flecainide 100 mg b.i.d.  4. Zegerid 40/1100 mg b.i.d.  5. Multivitamin.  6. Lactulose.   EXAM:  A thin, pleasant woman in no acute distress.  Weight 113, stable.  Blood pressure 105/75, heart rate 60 and regular, respirations 14.  HEENT:  Asymmetric facies.  NECK:  No jugular venous distention; no carotid bruits.  LUNGS:  Clear.  CARDIAC:  Normal first and second heart sounds; fourth heart sound  present.  ABDOMEN:  Soft and nontender; no hepatomegaly.  EXTREMITIES:  No edema; 1+ distal pulses.  There is a nodule measuring  approximately 2 cm in the palm of her right hand.  This is soft but not  fluctuant.  There does not appear to be any connection with the tendons.   IMPRESSION:  Ms Riquelme is doing well from a cardiac standpoint.  She may  or may not be suffering brief episodes of paroxysmal atrial  fibrillation.  Symptomatic control is generally excellent.  She does not  have an indication for anticoagulation.  Current therapy for atrial  fibrillation is appropriate.   The structure to right hand appears to be a dermal cyst.  I suggested  that she see a dermatologist if she  would like further attention for it.  I will plan to see this nice woman again in 9 months.    Sincerely,      Gerrit Friends. Dietrich Pates, MD, [facility]  Electronically Signed    RMR/MedQ  DD: 02/25/2007  DT: 02/27/2007  Job #: [id number]

## 2010-09-23 NOTE — Consult Note (Signed)
[name], STUDER NO.:  [id number]   MEDICAL RECORD NO.:  [id number]          PATIENT TYPE:  INP   LOCATION:  A325                          FACILITY:  [facility]   PHYSICIAN:  Tilford Pillar, MD      DATE OF BIRTH:  February 28, 1941   DATE OF CONSULTATION:  11/09/2007  DATE OF DISCHARGE:                                 CONSULTATION   CHIEF COMPLAINT:  Nausea, vomiting, and abdominal pain.   HISTORY OF PRESENT ILLNESS:  The patient is a 70 year old female with a  history of prior colon cancer requiring resection in 2006 as well as a  history of previous pelvic operations including hysterectomy.  She  presents to [facility] with increasing nausea, vomiting, and  abdominal distention.  This has been ongoing for approximately 24 hours.  She does state she has had similar episodes in the past, but has  resolved spontaneously after only several hours and, however, this  episode has continued without change.  She does admit to emesis.  The  nausea started the evening after the abdominal distention was noted and  followed by emesis.  This was a nonbloody bilious emesis in description.  She has noted change in her bowel function with a decreased stool  volume.  This started the morning of her increased abdominal distention.  She did say she had a small bowel movement and no diarrhea, no  hematochezia, and no melena.  At this point, however, she has denied any  episodes of flatus and no additional bowel movements at this point.  She  has denied any change in urination.  No odor, no dysuria, no hematuria,  no fever or chills.   PAST MEDICAL HISTORY:  Pertinent for,  1. Hyperlipidemia.  2. Reflux.  3. History of atrial fibrillation.  4. History of previously treated colon cancer.   PAST SURGICAL HISTORY:  1. Cholecystectomy.  2. Hysterectomy.  3. Tonsillectomy and adenoidectomy.  4. Partial colectomy for colon cancer.   MEDICATIONS:  1. Lipitor.  2. Aspirin.  3.  Lactulose.  4. Flecainide.   ALLERGIES:  No known drug allergies.   SOCIAL HISTORY:  No tobacco use.  No alcohol use.  No recreational drug  use.   PHYSICAL EXAMINATION:  VITAL SIGNS:  Temperature 97.5, heart rate 66,  respirations 18, blood pressure 110/60.  She has 100% O2 saturation on  room air.  GENERAL:  The patient is lying in a supine position in hospital bed.  She does not appear to be in any acute distress.  She is alert and  oriented x3.  Family is present at the room.  HEENT:  Pupils are equal, round, and reactive.  Extraocular movements  are intact.  No conjunctival pallor is noted.  Trachea is midline.  PULMONARY:  Unlabored respirations.  She is clear to auscultation.  CARDIOVASCULAR:  Regular rate and rhythm.  ABDOMEN:  Diminished bowel sounds.  The abdomen is soft, mildly  distended, mild diffuse tenderness, but no focal peritoneal signs or  focal severe abdominal tenderness is noted.  No hernias or masses are  appreciated.  EXTREMITIES:  Warm and dry.   PERTINENT RADIOGRAPHIC AND LABORATORY RESULTS:  CBC, white blood cell  count 11.9, hemoglobin of 13.9, hematocrit 41.3, platelets 335.  She has  85 neutrophils with a slight left shift.  Basic metabolic panel, sodium  140, potassium 4.0, chloride 104, bicarb 27, BUN 17, creatinine 0.8,  blood glucose 120.  UA is negative with slight ketones, no blood, no  leukocytes, no bacteria.  Acute abdominal series demonstrates no  evidence of any free air.  She does have a couple of areas of air fluid  levels but overall a nonspecific gas pattern.  There is no significant  gas within the rectum.   ASSESSMENT AND PLAN:  A partial small bowel obstruction.  At this point,  the patient has had up-to-date some GI function, so a complete bowel  obstruction is unlikely; however, this so could be an early complete  bowel obstruction in progression.  At this point, I would agree with  continuing bowel rest with keeping the patient  n.p.o., continuing IV  fluid hydration.  I did discuss with the patient the possible need for a  nasogastric tube placement for gastric decompression especially if her  nausea and vomiting continue despite antiemetic medication.  Additionally, I discussed the patient's clinical signs of failure of  conservative management and indications for operative intervention  including increasing white blood cell counts, increasing abdominal pain,  fevers, or change in heart rate or blood pressure.  Additionally, I  discussed with the patient nonresolution and discussed with the patient  that should her symptomatology and clinical presentation not  significantly change, she still may require surgical intervention.  The  pathophysiology of adhesive disease was discussed with the patient.  At  this point, I do suspect intra-abdominal adhesions as the etiology of  her partial small bowel obstruction; however, recurrent metastatic  disease should also be considered especially given her prior history,  although this is a low suspicion due to her history of recent negative  colonoscopy.  I would recommend again continuing the patient on n.p.o.  status, IV fluid hydration.  I would recommend obtaining a CT evaluation  of the abdomen and pelvis in order to ensure there is not a extraluminal  mass contributing to her symptomatology.      Tilford Pillar, MD  Electronically Signed     BZ/MEDQ  D:  11/10/2007  T:  11/10/2007  Job:  [id number]   cc:   Lorin Picket A. Gerda Diss, MD  Fax: [phone]

## 2010-09-23 NOTE — Group Therapy Note (Signed)
NAMESHAKESHA, [name]                 ACCOUNT NO.:  [id number]   MEDICAL RECORD NO.:  [id number]          PATIENT TYPE:  INP   LOCATION:  A325                          FACILITY:  [facility]   PHYSICIAN:  Scott A. Gerda Diss, MD    DATE OF BIRTH:  [birth year]   DATE OF PROCEDURE:  11/10/2007  DATE OF DISCHARGE:                                 PROGRESS NOTE   Abdominal pain discomfort.  CT scan showed partial small bowel  obstruction.  We are going to get an upright KUB today to see what the  gas pattern is looking like.  If this worrisome pattern and she has some  recurring abdominal pains and patterns, this could be an indication that  she has severe adhesions that are heading toward having problem with  adhesions to the point of causing obstruction which.  We will just go  forward and follow her conservatively for now.      Scott A. Gerda Diss, MD  Electronically Signed     SAL/MEDQ  D:  11/10/2007  T:  11/10/2007  Job:  [id number]

## 2010-09-23 NOTE — Letter (Signed)
Sep 19, 2007    Dr. Lacretia Nicks. [facility]  [address]  [place], [place]  [address]   RE:  [name], [name]  MRN:  [id number]  /  DOB:  1940/10/12   Dear Dr. Benna Dunks:   It was my pleasure evaluating Ms. Kirsh prior to planned noncardiac  surgery.  She has a history of paroxysmal atrial fibrillation, which has  been generally well controlled with an antiarrhythmic drug, flecainide  100 mg b.i.d.  Since I last saw her approximately five months ago, she  reports no palpitations.   The remainder of her medical history is notable for a colon cancer that  was resected in November 2006 and was evaluated to be stage I.  She has  had an ovarian cyst, GERD and hyperlipidemia.  Cardiac catheterization  in 1999 and a stress nuclear study in July 2007 were both negative.  Surgeries have included cholecystectomy and TAH-BSO.   On exam, pleasant trim woman in no acute distress.  The weight is 114,  stable.  Blood pressure 105/65, heart rate 70 and regular, respirations  18.  NECK:  No jugular venous distention; normal carotid upstrokes without  bruits.  BACK:  Straight without usual thoracic kyphosis.  THORAX:  Clear lung fields.  CARDIAC:  Normal first and second heart sounds; fourth heart sound  present.  ABDOMEN:  Soft and nontender; no organomegaly.  EXTREMITIES:  No edema; distal pulses intact.   EKG:  Normal sinus rhythm; incomplete right bundle branch block and  delayed R-wave progression, which are new since previous tracing of  April 2008.   IMPRESSION:  Ms. Riga has paroxysmal atrial fibrillation that does not  reflect significant surgical risk.  Aspirin can be stopped at your  discretion and restarted postoperatively, when the risk of bleeding is  minimal.  There is some risk of recurrent atrial fibrillation with the  stress of surgery.  This should not represent a significant  perioperative complication, if it occurs.  If you require any  assistance  on the day of surgery, please page the [facility] cardiologist at [facility]  I will plan to reassess this nice woman in the office in nine  months.    Sincerely,      Gerrit Friends. Dietrich Pates, MD, [facility]  Electronically Signed    RMR/MedQ  DD: 09/19/2007  DT: 09/19/2007  Job #: [id number]   CC:    Assunta Found, M.D.

## 2010-09-26 NOTE — Procedures (Signed)
[facility]  Patient:    [name], [name] Visit Number: [id number] MRN: [id number]          Service Type: MED Location: [phone] Attending Physician:  Cain Sieve Dictated by:   Kari Baars, M.D. Proc. Date: 11/24/01 Admit Date:  11/24/2001 Discharge Date: 11/25/2001                            EKG Interpretations  TIME/DATE:  0865, November 24, 2001  DESCRIPTION:  The rhythm is sinus rhythm with a rate in the 60s.  IMPRESSION:  Normal electrocardiogram. Dictated by:   Kari Baars, M.D. Attending Physician:  Cain Sieve DD:  11/24/01 TD:  11/30/01 Job: [id number]

## 2010-09-26 NOTE — Procedures (Signed)
[facility]  Patient:    [name], [name] Visit Number: [id number] MRN: [id number]          Service Type: OUT Location: RAD Attending Physician:  Pricilla Riffle Dictated by:   Joellyn Rued, P.A.-C. Proc. Date: 03/10/01 Admit Date:  03/10/2001                                Stress Test  DATE OF BIRTH:  03-12-1941  PROCEDURE:  Stress Cardiolite test  HISTORY:  [name] is a 70 year old white female referred to our office by Dr. Colette Ribas, for atypical chest discomfort and palpitations.  She does have a history of hyperlipidemia.  A cardiac catheterization in 1999, did not show any coronary artery disease.  DESCRIPTION OF PROCEDURE:  The resting electrocardiogram showed normal sinus rhythm, blood pressure 100/70, pulse 63.  Utilizing the Bruce protocol, [name] ambulated a total of eight minutes and 43 seconds, achieving 103% predicted maximum heart rate, 10.1 METS.  She did not have any electrocardiogram changes.  Her maximum blood pressure was 174/80, maximum heart rate was 167.  The test was discontinued secondary to chest heaviness and shortness of breath.  Final report and images pending dictation. Dictated by:   Joellyn Rued, P.A.-C. Attending Physician:  Pricilla Riffle DD:  03/10/01 TD:  03/10/01 Job: [id number]

## 2010-09-26 NOTE — Discharge Summary (Signed)
NAME:  [name], [name]                           ACCOUNT NO.:  [id number]   MEDICAL RECORD NO.:  [id number]                   PATIENT TYPE:  INP   LOCATION:  6524                                 FACILITY:  [facility]   PHYSICIAN:  Duke Salvia, M.D.               DATE OF BIRTH:  09-22-1940   DATE OF ADMISSION:  08/28/2003  DATE OF DISCHARGE:  08/29/2003                           DISCHARGE SUMMARY - REFERRING   HISTORY:  [name] is a 70 year old female with history of palpitations,  who presented to [facility] Emergency Room with prolonged symptomatic  episodes that included malaise, fatigue, weakness, dizziness, and  palpitations.  In the ER, she was noted to be in atrial fibrillation with a  rapid ventricular response within occasional intermittent sinus beats. She  was asymptomatic at rest.  Her cardiac history dates back at least to 1999  when she underwent cardiac catheterization which was reportedly negative.  She has continued to have palpitations for the last three years, but no  arrhythmias were documented until February of this year when there was  history of a report of PSVT; however, the exact nature and duration of the  arrhythmia were not clear at the time of admission.  She was evaluated by  Dr. Lorenz Coaster who recommended increased dose of beta blocker; however, she has  continued to have brief episodes of palpitations until this morning when she  had a three-hour episode.  She was able to go to work; however, her symptoms  required her to come to the emergency department.   She has also had a supposedly normal TSH in the past and echocardiogram.  She does have a history of hyperlipidemia, GERD, status post cholecystectomy  and hysterectomy.   LABORATORY AND X-RAY DATA:  Hemoglobin 13.7, hematocrit 38.5, normal  indices, platelets 327, WBC 8.0.  PT 11.8, PTT 38.  Sodium 140, potassium  3.6, BUN 23, creatinine 0.8, glucose 128, normal LFTs.  CK-MB and troponin  negative for  myocardial infarction. Total cholesterol 174, triglycerides  143, HDL 51, LDL 94, TSH 1.889.   Chest x-ray did not show any active lung disease.  There was slight  hyperaeration.   EKG from [facility] showed atrial fibrillation with intermittent sinus  beats. Ventricular rate was probably in the 130s when she was in atrial  fibrillation.  Normal axis, nonspecific ST-T wave change.   HOSPITAL COURSE:  [name] was admitted to 6500 by Dr. Dietrich Pates.  Overnight,  after she received a dose of sotalol, she maintained normal sinus rhythm  without any further ectopy.  Dr. Dietrich Pates had Dr. Graciela Husbands to see in EP  consultation.  Dr. Graciela Husbands felt this was nonsustained atrial fibrillation.  He  spent considerable amount of time discussing proarrhythmic effects with  antiarrhythmic drugs. The patient was not interested in pursuing this avenue  at this time; thus, atenolol was discontinued, and Cardizem 180 CD was begun  every day.  Dr. Graciela Husbands recommended discontinuing sotalol so that she can be  discharged home and consider a Class 1C agent.   DISCHARGE DIAGNOSIS:  Paroxysmal atrial fibrillation with history as  previously.   DISPOSITION:  She is discharged home.   DISCHARGE MEDICATIONS:  1. Her new medication includes Cardizem CD 180 mg daily.  2. She is asked to continue her Lipitor 20 mg q.h.s..  3. Nexium 20 mg daily.  4. Multivitamins as previously.  5. Baby aspirin 81 mg daily.  6. She has been instructed not to take atenolol.   DISCHARGE INSTRUCTIONS:  1. Her activities were not restricted.  2. She was asked to maintain low-salt, low-fat, low-cholesterol diet.  3. Avoid pseudoephedrine products.  4. She was asked to call our office in the morning to arrange a followup     appointment with Dr. Dorethea Clan in [place] and to see Dr. Sherwood Gambler as needed.      Joellyn Rued, P.A. [facility]                    Duke Salvia, M.D.    EW/MEDQ  D:  08/29/2003  T:  08/30/2003  Job:  [id number]   cc:    Madelin Rear. Sherwood Gambler, M.D.  [address]  [place]  [place] [address]  Fax: [phone]   Vida Roller, M.D.  Fax: [phone]

## 2010-09-26 NOTE — H&P (Signed)
NAMEJAMEA, [name]                 ACCOUNT NO.:  [id number]   MEDICAL RECORD NO.:  [id number]          PATIENT TYPE:  INP   LOCATION:  A227                          FACILITY:  [facility]   PHYSICIAN:  Scott A. Gerda Diss, MD    DATE OF BIRTH:  [birth year]   DATE OF ADMISSION:  08/16/2006  DATE OF DISCHARGE:  04/08/2008LH                              HISTORY & PHYSICAL   CHIEF COMPLAINT:  Substernal chest discomfort.   HISTORY OF PRESENT ILLNESS:  This is a 70 year old white female who  presents with substernal chest discomfort that has been present all day  long.  Has been having the discomfort throughout the day.  No shortness  of breath with no sweats, no vomiting, no diarrhea, PMH.  Has had  problems in the past with atrial fib with hyperlipidemia with reflux  with colon cancer.  In addition to this smoker all the way up to 1999.  Has had gallbladder removed in the past, hysterectomy in the past.   SOCIAL HISTORY:  Married.   FAMILY HISTORY:  Colon cancer, hypertension, heart disease.   ALLERGIES:  None.   REVIEW OF SYSTEMS:  Per above.   MEDICATIONS:  1. Tums daily.  2. Centrum Silver daily.  3. Lipitor daily 20 mg.  4. Flecainide 100 mg b.i.d.  5. Aspirin 325.  6. Zegerid b.i.d.   REVIEW OF SYSTEMS:  See per above. __________  .   PHYSICAL EXAMINATION:  HEENT:  Benign.  NECK:  No masses.  CHEST:  CTA.  No crackles.  HEART:  Regular.  ABDOMEN:  Soft.  EXTREMITIES:  No edema.  SKIN:  Warm, dry.  NEUROLOGICAL:  Grossly normal.   ASSESSMENT/PLAN:  Chest pain - I feel the patient at the very least  needs to stay overnight in the hospital.  Will have  cardiology see her  in the morning.  I really doubt that this is a heart attack, but her  symptoms and her risk factors make it such to where one cannot ignore  this so therefore we will go ahead and admit her and will work toward  treatment accordingly.      Scott A. Gerda Diss, MD  Electronically Signed     SAL/MEDQ  D:   08/17/2006  T:  08/17/2006  Job:  [id number]

## 2010-09-26 NOTE — Discharge Summary (Signed)
[place]  Patient:    [name], [name] Visit Number: [id number] MRN: [id number]          Service Type: EMS Location: ED Attending Physician:  Hilario Quarry Dictated by:   Pennelope Bracken, N.P. Admit Date:  11/24/2001 Discharge Date: 11/24/2001   CC:         Elfredia Nevins, M.D., primary care   Discharge Summary  DATE OF BIRTH:  [birth year]  REASON FOR ADMISSION:  Chest pain.  DISCHARGE DIAGNOSES: 1. Chest pain, etiology uncertain. 2. Tobacco abuse. 3. Reflux, gastroesophageal reflux disease. 4. History of palpitations. 5. Postmenopausal on replacement. 6. Hypercholesterolemia.  HISTORY OF PRESENT ILLNESS:  This delightful 70 year old lady came to [place] from [facility] on day of admission.  She presented to the ED there relating the following story:  She went to work, made a cup of black coffee, and took a sip.  Immediately after swallowing, she had sudden onset of sharp pain that radiated from the substernal area through to the back.  She became clammy and diaphoretic and almost presyncopal with this episode.  She describes an associated simultaneous neck discomfort which is neither painful nor pleasant, but might be described as a fluttering in the neck.  She tried Rolaids without any relief.  In the ED there she was given O2 and nitroglycerin with complete relief.  The patient then admitted that she had been having chest tightness spells for "quite a while."  It was for her unusual presentation and history of palpitations that she was transferred to [place] for further evaluation and treatment.  HOSPITAL COURSE:  The patient was admitted to telemetry and maintained on her meds.  She was having no chest pain at time of admission but did have multiple eructations.  Serial enzymes were continued from [facility] and these remained negative.  A chemistry showed a potassium slightly low at 3.5. Initially she was scheduled for an adenosine  Cardiolite but had just had one in November that was negative for ischemia, so this was cancelled.  Given her history, her presentation, and subsequent clinical status, we elected to have her evaluated by GI as an outpatient for achalasia or motility problems.  On day of discharge Dr. Antoine Poche examined the patient and felt she was appropriate for discharge.  A smoking cessation consult was ordered and completed.  The patient is a very light smoker.  PHYSICAL EXAMINATION:  SUBJECTIVE:  On day of discharge, the patient offered no complaints of chest pain or abdominal pain.  No palpitations or shortness of breath.  GENERAL:  No distress.  VITAL SIGNS:  Blood pressure 108/56, pulse 82, saturation 99% on room air, temperature 97.0.  LUNGS:  Clear to auscultation.  HEART:  Regular rate and rhythm.  ABDOMEN:  Positive bowel sounds.  EXTREMITIES:  With 2+ pulses.  LABORATORY DATA:  EKG shows normal sinus rhythm at a rate of 65 with a mild sinus arrhythmia.  There are tiny Qs in inferior leads.  Discharge sodium 141, potassium 3.6, chloride 110, CO2 25, BUN 13, creatinine 0.7, glucose 109.   Discharge hemogram:  WBC 7.0, hemoglobin 11.8, hematocrit 34.7, platelets 350.  Thyroid hormone within normal limits.  Chest x-ray at [facility] showed no active disease.  CT of chest at [facility] was negative for PE.  Cardiac enzymes were negative in three sets.  DISPOSITION:  The patient is discharged to home in the care of her very supportive family on the following medications.  DISCHARGE MEDICATIONS: 1. Aspirin 81 mg one q.d. 2. Premarin 0.625 mg one q.d.  It should be noted that the patient is advised    that the combination of smoking and Premarin can increase thrombogenesis    and she is advised to taper down off of her use of hormones per primary    care. 3. Zocor 40 mg one q.h.s. 4. Protonix 80 mg one q.d. with food.  DIET RECOMMENDED:  Low cholesterol diet with plenty of  potassium supplementation.  This was discussed at length with the patient.  FOLLOW-UP:  As needed with Dr. Daleen Squibb in [place].  The patient agrees to see Dr. Sherwood Gambler in the next week or so where an appointment for GI evaluation can be made. Dictated by:   Pennelope Bracken, N.P. Attending Physician:  Hilario Quarry DD:  11/25/01 TD:  11/25/01 Job: [id number]

## 2010-09-26 NOTE — Discharge Summary (Signed)
[name], [name]                 ACCOUNT NO.:  [id number]   MEDICAL RECORD NO.:  [id number]          PATIENT TYPE:  INP   LOCATION:  A227                          FACILITY:  [facility]   PHYSICIAN:  Scott A. Gerda Diss, MD    DATE OF BIRTH:  Nov 05, 1940   DATE OF ADMISSION:  08/16/2006  DATE OF DISCHARGE:  04/08/2008LH                               DISCHARGE SUMMARY   DISCHARGE DIAGNOSIS:  Chest discomfort.   HISTORY OF PRESENT ILLNESS:  The patient was admitted in with chest  discomfort onset early in the day concerning for cardiac substernal  pressure in nature.  The patient was admitted in.  Enzymes were drawn.  The patient had negative enzymes.  Cardiology consult was done.  They  found a low likelihood of angina.  Recommended a plain echo and felt as  long as a normal left ventricular function and no regional wall  abnormalities she could be discharged to home with outpatient follow up  with Dr. Dietrich Pates and he could always consider doing a stress echo as an  outpatient.  The patient improved and with cardiology clearance the  following day it was felt reasonable to allow the patient to go home and  thus, she was discharged to home.  It should be noted in her lab work,  while in the hospital, her hemoglobin was 11.9, MCV 97, white count 9.6.  INR and PTT were both normal.  Potassium, glucose (non-fasting) normal.  Sodium normal.  Liver enzymes normal.  Cardiac enzymes negative.  Total  cholesterol 181, triglycerides 211, HDL 44, LDL 95.  Flecainide level  0.39.  TSH 0.826.      Scott A. Gerda Diss, MD  Electronically Signed     SAL/MEDQ  D:  08/27/2006  T:  08/27/2006  Job:  [id number]   cc:   Gerrit Friends. Dietrich Pates, MD, [facility]  [address]  [place], [place] [address]

## 2010-09-26 NOTE — Procedures (Signed)
NAME:  [name], [name]                           ACCOUNT NO.:  [id number]   MEDICAL RECORD NO.:  [id number]                   PATIENT TYPE:  INP   LOCATION:  6524                                 FACILITY:  [facility]   PHYSICIAN:  Edward L. Juanetta Gosling, M.D.             DATE OF BIRTH:  Jun 04, 1940   DATE OF PROCEDURE:  DATE OF DISCHARGE:                                EKG INTERPRETATION   TIME:  August 28, 2003, at 15:40.   FINDINGS:  1. The computer has read this as sinus tachycardia, but I do not see     definite P waves except in the areas where the heart rate is slower.     There is a considerable variation in the heart rate.  2. I agree there is left ventricular hypertrophy, and there are Q waves     starting in V3 which could indicate a previous anterior infarction.  I     believe the rhythm generally represents some sort of supraventricular     tachycardia, even possibly atrial fibrillation with a rapid ventricular     response.   IMPRESSION:  Abnormal EKG.      ___________________________________________                                            Oneal Deputy Juanetta Gosling, M.D.   ELH/MEDQ  D:  08/28/2003  T:  08/29/2003  Job:  [id number]

## 2010-09-26 NOTE — Procedures (Signed)
NAMEDELROSE, ROHWER NO.:  [id number]   MEDICAL RECORD NO.:  [id number]          PATIENT TYPE:  INP   LOCATION:  A222                          FACILITY:  [facility]   PHYSICIAN:  Donna Bernard, M.D.DATE OF BIRTH:  1941/02/12   DATE OF PROCEDURE:  11/17/2005  DATE OF DISCHARGE:  11/18/2005                                    STRESS TEST   EKG:  Electrocardiogram reveals sinus bradycardia.  There are nonspecific ST-  T changes noted.      Donna Bernard, M.D.  Electronically Signed     WSL/MEDQ  D:  11/25/2005  T:  11/25/2005  Job:  [id number]

## 2010-09-26 NOTE — H&P (Signed)
[name], [name] NO.:  [id number]   MEDICAL RECORD NO.:  [id number]          PATIENT TYPE:  INP   LOCATION:  A222                          FACILITY:  [facility]   PHYSICIAN:  Donna Bernard, M.D.DATE OF BIRTH:  May 13, 1940   DATE OF ADMISSION:  11/16/2005  DATE OF DISCHARGE:  LH                                HISTORY & PHYSICAL   CHIEF COMPLAINT:  Chest pain.   SUBJECTIVE:  This patient is a 70 year old white female with a history of  hyperlipidemia, prior smoking, chronic reflux, paroxysmal atrial  fibrillation, and recent diagnosis of colon cancer who presents to the  emergency room the day of admission with complaints of chest pain.  The  chest pain was deep, pressure like, substernal with a bit of radiation to  the back.  There is no associated nausea, diaphoresis, or shortness of  breath.  It occurred early in the afternoon and started off and on  throughout the afternoon.  At times, there was a deep pressure associated  with this.  The patient took Tums and Maalox with only minimal help.  She  states that recently she had been using her Nexium on only a p.r.n. basis.  She is compliant with her other meds, all of which include aspirin 325 mg  daily, Nexium 40 mg daily, Flecainide 100 mg p.o. b.i.d., Lipitor 20 mg  daily, Centrum Silver one daily, zinc one daily, Tums three daily primary  for calcium purposes.   PRIOR SURGERIES:  Cholecystectomy, tonsillectomy, hysterectomy, colon cancer  surgery in December 2006.   FAMILY HISTORY:  Positive for hypertension, coronary artery disease.   SOCIAL HISTORY:  The patient is retired.  No current tobacco or alcohol use.  Married.   ALLERGIES:  No known allergies.   REVIEW OF SYSTEMS:  Otherwise negative.   PHYSICAL EXAMINATION:  VITAL SIGNS:  BP 126/70, pulse 54.  GENERAL:  The patient is alert and oriented, in no acute distress.  HEENT:  Normal.  NECK:  Supple.  LUNGS:  Clear.  HEART:  Regular rate and  rhythm.  No chest wall tenderness.  No abdominal  tenderness.  EXTREMITIES:  Normal.  No edema.  Pulses good.  NEUROLOGIC:  Intact.   LABORATORY DATA:  Cardiac enzymes negative.  Initial EKG:  Sinus bradycardia  with nonspecific ST-T changes.   The patient was given 1 nitroglycerin in the ER and improved with this.   IMPRESSION:  Chest pain in someone with multiple risk factors.  She has had  a couple of stress tests in the past.  In fact, even had a negative cardiac  catheterization in 1999, per the family.  However, with multiple risk  factors; substernal pain, pressure like in nature; resolution with one  nitro, we cannot afford to ignore the possibility of coronary artery disease  emerging.   PLAN:  Admit for serial cardiac enzymes, Lovenox.  Maintain the same meds.  Repeat EKG.  CCNT.  Cardiology consultation.  Will at least need a stress  test.  Rationale discussed with the family.  Further orders are noted in  chart.      Donna Bernard, M.D.  Electronically Signed     WSL/MEDQ  D:  11/17/2005  T:  11/17/2005  Job:  [id number]

## 2010-09-26 NOTE — Discharge Summary (Signed)
NAME:  [name], [name]                           ACCOUNT NO.:  [id number]   MEDICAL RECORD NO.:  [id number]                   PATIENT TYPE:  OUT   LOCATION:  RDC                                  FACILITY:  [facility]   PHYSICIAN:  Vida Roller, M.D.                DATE OF BIRTH:  [birth year]   DATE OF ADMISSION:  07/05/2003  DATE OF DISCHARGE:  07/06/2003                                 DISCHARGE SUMMARY   ADMISSION DIAGNOSIS:  Palpitations with paroxysmal atrial tachycardia.   DISCHARGE DIAGNOSIS:  Palpitations with paroxysmal atrial tachycardia.   DISPOSITION:  Home.  Follow up in cardiology with Dr. Ladona Ridgel and Dr. Dorethea Clan.   PROCEDURES PERFORMED:  Echocardiogram which revealed normal left ventricular  systolic function, normal left ventricular systolic size and mild mitral  regurgitation.   HOSPITAL COURSE:  [name] is a 70 year old woman who was admitted from the  ER after an episode of palpitations.  She was sent to the ER from Dr.  Sharyon Medicus office in [place].  She was evaluated in the hospital and found  to have a normal echocardiogram.  She had a strip which showed evidence of  PAT.  Both Dr. Daleen Squibb and Dr. Ladona Ridgel reviewed the strip, and felt that the PAT  was probably easily treated with medications.  She was restarted on her  atenolol.  She did well and was discharged to home with instructions that if  her PAT recurred on the medications, that we would consider an  electrophysiologic study.   DISCHARGE MEDICATIONS:  1. Aspirin 325 mg a day.  2. Lipitor 20 mg a day.  3. Nexium 20 mg a day.  4. Atenolol 25 mg a day.   PHYSICAL EXAMINATION:  Please see my dictated history and physical.                                                Vida Roller, M.D.    JH/MEDQ  D:  08/05/2003  T:  08/05/2003  Job:  [id number]

## 2010-09-26 NOTE — H&P (Signed)
[name], [name]                 ACCOUNT NO.:  [id number]   MEDICAL RECORD NO.:  [id number]          PATIENT TYPE:  INP   LOCATION:  A227                          FACILITY:  [facility]   PHYSICIAN:  Lisa Pick. Eden Emms, MD, FACCDATE OF BIRTH:  19-May-1940   DATE OF ADMISSION:  08/16/2006  DATE OF DISCHARGE:  LH                              HISTORY & PHYSICAL   [name] is a 70 year old patient admitted at about 5 o'clock last  night with chest pain.  The patient has a long-standing history of  atypical chest pain.  She had a normal cath by Dr. Alanda Amass in 1999 and  a normal stress test in 2007 by Dr. Dietrich Pates.   The pain was atypical.  She actually woke up from sleep Monday morning  with kind of right-sided or right upper quadrant type pain.  She took a  nitroglycerin for it.  She says she has taken about 3 nitroglycerin over  the last 2 years.  She then went to work.  She did not feel well all  day.   The patient just felt an aching type feeling in the right side of her  chest and right costophrenic angle.  The patient's pain was non-  exertional.  There was no association with eating.  Her GI review of  systems was negative.   The patient is currently pain free.  Her initial set of cardiac markers  was negative, and there were no acute EKG changes.  Her monitor has been  fine overnight.   PAST MEDICAL HISTORY:  1. History of cholecystectomy.  2. History of hysterectomy.  3. History of colon cancer with removal in November of 2006.  4. She has a long-standing history of GERD.  5. There is also a history of apparent paroxysmal atrial fibrillation.   ALLERGIES:  The patient is not allergic to anything.   CORONARY RISK FACTORS:  Coronary risk factors include hyperlipidemia,  for which she has been on a cholesterol medicine.   SOCIAL HISTORY:  The patient lives in [place].  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  There has been some stress in her life due to  the economy.   She has 1 grown child who is 36 years old.  She likes to  go shopping when she is not working.  She is a nonsmoker.   REVIEW OF SYSTEMS:  The patient's 10-point review of systems is  remarkable for no significant diaphoresis or shortness of breath.  No  nausea or vomiting.  No diarrhea.   MEDICATIONS:  1. A baby aspirin a day.  2. Prilosec 20 b.i.d.  3. Premarin 0.625 daily.  4. Lipitor 20 a day.  5. Atenolol 25 b.i.d.  6. P.R.N. Tums.  7. Lactulose at night.   PHYSICAL EXAMINATION:  VITAL SIGNS:  Blood pressure of 120/70, pulse of  70 and regular.  HEENT:  Normal.  NECK:  Carotids normal without bruits.  LUNGS:  Clear.  HEART:  There is an S1 and S2 with normal heart sounds.  ABDOMEN:  Benign.  There are hysterectomy  and cholecystectomy scars.  EXTREMITIES:  Distal pulses are intact with no edema.  NEUROLOGIC:  Nonfocal.  SKIN:  Warm and dry.   ELECTROCARDIOGRAM:  Sinus rhythm with no acute changes and a slight  incomplete right bundle branch block which is chronic.   LABORATORY DATA:  Her laboratory work is remarkable for a hematocrit of  34.7, potassium 4.0, BUN of 20, creatinine of 0.6.  Two sets of initial  CPKs were negative.   Chest x-ray shows no active disease and no cardiomegaly.   IMPRESSION:  Atypical chest pain in a patient primarily with  hyperlipidemia.  The patient has had a normal cath in 1999 and normal  stress test in 2007.  I think that the patient can have a 2-D  echocardiogram this morning.  So long as there is normal left  ventricular function and no regional wall motion abnormality, she can be  discharged home for outpatient follow up with Dr. Dietrich Pates.  She already  has an appointment with him for follow up in a week.   It may be reasonable at that point to do a follow up stress echo.   The patient will continue her Lipitor for hypercholesterolemia.  Her  LFT's were normal on admission.  It may be worthwhile to do an abdominal  CT scan to  follow up the etiology to her right-sided pain, which seems  to be centered around the costophrenic junction.   Overall, I think that here is a low likelihood that this pain represents  ischemia or angina.   Further recommendations will be based on the results of her echo.      Lisa Pick. Eden Emms, MD, [facility]  Electronically Signed     PCN/MEDQ  D:  08/17/2006  T:  08/17/2006  Job:  [id number]   cc:   [facility] office

## 2010-09-26 NOTE — H&P (Signed)
NAME:  [name], [name]                           ACCOUNT NO.:  [id number]   MEDICAL RECORD NO.:  [id number]                   PATIENT TYPE:  INP   LOCATION:  1823                                 FACILITY:  [facility]   PHYSICIAN:  [name], M.D.                DATE OF BIRTH:  1941/01/01   DATE OF ADMISSION:  07/05/2003  DATE OF DISCHARGE:                                HISTORY & PHYSICAL   PRIMARY CARE PHYSICIAN:  [name].   CHIEF COMPLAINT:  1. Chest pain.  2. Tachy palpitations.   HISTORY OF PRESENT ILLNESS:  [name] is a very-pleasant 70 year old  married white female with a history of normal cardiac catheterization in  1999 per her report, hyperlipidemia, gastroesophageal reflux disease and  tachy palpitations, who is referred to the [facility] emergency  room by her primary care physician secondary to suspected supraventricular  tachycardia.  The patient has had recurrent palpitations this week.  Last  night, it was the worst it has been.  Her palpitations usually come on at  rest and after they subside, she develops substernal chest heaviness.  She  has some associated left arm and neck tingling with this pain.  She has had  some diaphoresis, but no nausea or vomiting.  She did feel lightheaded this  morning, but denies any syncope.  She notes that the heaviness in her chest  feels like an elephant sitting on her chest.  At its worst, the pain has  been 10/10.  Currently she is having residual 1/10 chest pain.  She saw Dr.  Madelin Rear. [name] in his office today.  Apparently, she had supraventricular  tachycardia on her electrocardiogram.  She was referred to [facility] ER for further evaluation.  The electrocardiogram from [name]  office is currently not available for review. Her electrocardiogram in [facility] emergency room does show normal sinus rhythm with a heart rate of 71.   PAST MEDICAL HISTORY:  As noted above.  She has a  history of normal cardiac  catheterization per her report in 1999.  She did have a negative stress  Cardiolite study done in November of 2002.  She has a history of tachy  palpitations and was previously worked up by [name] in [place].  Apparently she had a monitor placed as well as a 2-D echocardiogram some  time ago.  She has a history of gastroesophageal reflux disease, treated  hypercholesterolemia.   PAST SURGICAL HISTORY:  Significant for cholecystectomy and complete  hysterectomy.   ALLERGIES:  No known drug allergies.   MEDICATIONS:  1. Lipitor 20 mg q.h.s.  2. Nexium 20 mg daily.  3. Atenolol 25 mg daily.  4. Multivitamin daily.  5. Aspirin 81 mg daily.   SOCIAL HISTORY:  The patient lives in [place] with her husband.  She is  an Film/video editor.  She quit smoking cigarettes about one to two years ago.  She denies any alcohol use.   FAMILY HISTORY:  Positive for coronary artery disease.  Her mother is alive  and well, and she had a CABG done at age 45.   REVIEW OF SYSTEMS:  Please see HPI.  She denies any fevers, chills,  headaches or throat rash, shortness of breath, dyspnea on exertion,  orthopnea, paroxysmal nocturnal dyspnea, edema, syncope, cough, dysuria,  hematuria, weakness, numbness, myalgias, arthralgias, nausea, vomiting,  diarrhea, bright red blood per rectum, melena, or skin changes.  She has had  some sweats with her chest pain this morning as well as some dizziness. The  rest of review of systems are negative.   PHYSICAL EXAMINATION:  GENERAL:  She is a well-nourished, well-developed  female in no acute distress.  VITAL SIGNS:  Blood pressure 116/66, pulse 67, respirations 12.  HEENT:  Head normocephalic, atraumatic.  Eyes:  PERRLA, EOMI.  Sclerae are  clear.  ENT:  Oropharynx is pink without exudate.  NECK:  Without jugular venous distension.Marland Kitchen  LYMPH:  Without lymphadenopathy.  ENDOCRINE:  Without thyromegaly.  VASCULAR:  Without carotid  bruits bilaterally.  CARDIAC:  Normal S1, S2, regular rate and rhythm, no No murmurs rubs, or  gallops.  LUNGS:  Clear to auscultation, bilaterally.  ABDOMEN:  Soft, nontender. Normoactive bowel sounds.  No hepatomegaly, no  rebound, no guarding.  SKIN:  Without rashes.  EXTREMITIES:  Without clubbing, cyanosis or edema.  MUSCULOSKELETAL:  Without joint deformities.  NEUROLOGIC:  Alert and oriented x3.  No focal deficits noted.   Chest x-ray:  Performed at [name] office today (results not available).   Electrocardiogram in the emergency room reveals normal sinus rhythm with a  heart rate of 71, normal axis and nonspecific ST-T wave changes.   IMPRESSION:  1. Tachy palpitations.     A. Reportedly supraventricular tachycardia earlier today.  2. Chest pain.  3. Treated hypercholesterolemia.  4. Gastroesophageal reflux disease.  5. Family history of coronary artery disease.  6. Ex-smoker.   PLAN:  The patient was also seen and examined by Dr. Vida [name].  He  formulated the following plan:  1. Admit the patient and rule her out with serial enzymes.  If her initially     enzymes are elevated, we will place her on heparin and nitroglycerin.     The pain is quite atypical, and we doubt coronary ischemia.  2. We will take a 2-D echocardiogram in the morning, and try to retrieve     records from [name] office to include her chest x-ray and     electrocardiogram.  If her electrocardiogram does show supraventricular     tachycardia, the we could possibly get our electrophysiology team to see     her in the hospital prior to discharge.  If her cardiac enzymes are     negative for myocardial infarction, we will set her up for an outpatient     Cardiolite in     [place] in the near future with follow up with [name].  3. We will check some lab work in the emergency room to include CMET, D-    Dimer, TSH, PT, PTT, CBC as well as her cardiac enzymes.      Tereso [name], P.A.                        [name], M.D.    SW/MEDQ  D:  07/05/2003  T:  07/06/2003  Job:  [id number]   cc:   Lisa Rear. Sherwood Gambler, M.D.  [address]  [place]  [place] [address]  Fax: [phone]

## 2010-09-26 NOTE — Letter (Signed)
March 01, 2006     [name], M.D.  [address] [place]. [name], [place] [address]   RE:  Lisa, [name]  MRN:  [id number]  /  DOB:  07-14-1940   Dear [name]:   [name] returns to the office for continued assessment and treatment of  chest discomfort and paroxysmal atrial fibrillation.  Since her last visit,  she has used nitroglycerine on three occasions without apparent benefit.  She describes pain that begins in her right upper abdomen and moves into the  right chest.  There is no associated dyspnea nor diaphoresis.  She has taken  TUMS and Mylanta with questionable benefit.  She has had minimal  palpitations.  She would like to substitute fish oil for atorvastatin   CURRENT MEDICATIONS:  1. Atorvastatin at a dose of 20 mg q.d.  2. Flecainide 100 mg b.i.d.  3. Aspirin 325 mg q.d.  4. Zegerid 40 mg b.i.d.  5. Lactulose 15 cc q. h.s.   EXAMINATION:  GENERAL:  Trim woman, in no acute distress.  VITAL SIGNS:  The weight is 112, three pounds less than in August.  Blood  pressure 110/70.  Heart 80 and regular.  Respirations 16.  NECK:  No jugular venous distention; normal carotid upstrokes without  bruits.  LUNGS:  Clear:  CARDIAC:  Normal 1st and 2nd heart sounds; 4th heart sound present.  ABDOMEN:  Soft and non-tender; no organomegaly; no masses; no bruits.  EXTREMITIES:  No edema; 1+ distal pulses.   IMPRESSION:  [name] had negative coronary angiography in 1999 and a  negative stress nuclear study in July 2007.  She continues to have atypical  chest discomfort with strong associated gastrointestinal symptoms.  I do not  think that this likely represents myocardial ischemia or a cardiac issue.  She will continue to follow up with [name], who is performing further  tests and evaluating current therapy.  A pH probe might be of benefit to  verify that stomach acidity is being suppressed by her current therapy.   [name] wishes to minimize her pharmaceutical  entities.  She does not have  known vascular disease, and has been taking atorvastatin for a long time.  We will assess a lipid profile off drug.  In the interim, she will start  fish oil.   I will plan to see this nice woman again in six months.    Sincerely,      Lisa Friends. Dietrich Pates, MD, [facility]    RMR/MedQ  DD: 03/01/2006  DT: 03/02/2006  Job #: [id number]   CC:    [name], M.D.D.

## 2010-09-26 NOTE — Procedures (Signed)
[name], [name]                 ACCOUNT NO.:  [id number]   MEDICAL RECORD NO.:  [id number]          PATIENT TYPE:  INP   LOCATION:  A227                          FACILITY:  [facility]   PHYSICIAN:  Noralyn Pick. Eden Emms, MD, FACCDATE OF BIRTH:  09/06/1940   DATE OF PROCEDURE:  08/17/2006  DATE OF DISCHARGE:  08/17/2006                                ECHOCARDIOGRAM   INDICATIONS:  Atrial fibrillation, check LV function, chest pain.   Left ventricular cavity size was normal.  EF was 60%.  There were no  regional wall motion abnormalities.  Mitral valve was mildly thickened.  There was trivial MR. Left atrium and right-sided cardiac chambers were  normal.  There was no evidence of pulmonary hypertension.  There was  mild TR.  Aortic valve was trileaflet, minimally sclerotic.  There was  trivial aortic insufficiency.  Subcostal imaging revealed no atrial  septal defect, no source of embolus and no effusion.   M-mode measurements included an aortic diameter of 27 mm, left atrial  diameter 32 mm, septal thickness 8 mm, LV diastolic dimension 44 mm,  systolic dimension 31 mm.   FINAL IMPRESSION:  1. Normal left ventricle, ejection fraction 60%.  2. Trivial mitral regurgitation.  3. Trivial aortic insufficiency.  4. Mild tricuspid regurgitation.  5. Normal atria and right ventricle.  6. No pericardial effusion.      Noralyn Pick. Eden Emms, MD, [facility]  Electronically Signed     PCN/MEDQ  D:  08/17/2006  T:  08/17/2006  Job:  [id number]   cc:   Lorin Picket A. Gerda Diss, MD  Fax: [phone]   [place], [place]

## 2010-09-26 NOTE — Op Note (Signed)
NAME:  [name], [name]                           ACCOUNT NO.:  [id number]   MEDICAL RECORD NO.:  [id number]                   PATIENT TYPE:  AMB   LOCATION:  DAY                                  FACILITY:  [facility]   PHYSICIAN:  Lionel December, M.D.                 DATE OF BIRTH:  Jun 10, 1940   DATE OF PROCEDURE:  07/07/2002  DATE OF DISCHARGE:                                 OPERATIVE REPORT   INDICATIONS FOR PROCEDURE:  The patient is a 70 year old Caucasian female  with chronic GERD who is having breakthrough symptoms while on double dose  therapy.  She also has had episodes of chest pain where she thought she was  having an MI.  She has been evaluated by Dr. Daleen Squibb, and cardiac workup in the  past has been negative.  She was just switched to Nexium recently and feels  somewhat better.  She is undergoing diagnostic EGD.  The procedure was  reviewed with the patient, and informed consent was obtained.   PREOPERATIVE MEDICATIONS:  Cetacaine spray for oropharyngeal topical  anesthesia, Demerol 10 mg IV, Versed 4 mg IV in divided doses.   INSTRUMENT USED:  The Olympus video system.   FINDINGS:  The procedure was performed in the endoscopy suite.  The  patient's vital signs and O2 saturations were monitored during the procedure  and remained stable.  The patient was placed in the left lateral position.  The endoscope was passed via the oropharynx without any difficulty into the  esophagus.   Esophagus:  The mucosa of the esophagus was normal.  The squamocolumnar  junction was wavy.  There were no erosions or ulcerations.  I also did not  see any changes to suggest Barrett's esophagus.  She had a small sliding  hiatal hernia.   Stomach:  It was empty and distended very well with insufflation.  The folds  of the proximal stomach were normal.  Examination of the mucosa revealed  antral erythema and granularity and a tiny scar along the posterior wall.  There were no erosions or ulcers noted.   The pyloric channel was patent.  The angularis and fundus were examined by retroflexing the scope and were  normal.   Duodenum:  Examination of the bulb revealed normal mucosa.  The scope was  passed to the second and third part of the duodenal mucosa, and the folds  were normal.  The endoscope was withdrawn.  The patient tolerated the  procedure well.   FINAL DIAGNOSES:  1. Small sliding hiatal hernia.  2. Esophagitis has healed completely since previous     esophagogastroduodenoscopy.  3. Nonerosive antral gastritis.   RECOMMENDATIONS:  1. She is to continue antireflux measures and stay on Nexium 40 mg p.o.     b.i.d. for another 12 weeks.  2. H pylori serology will be checked today.  3. I will contact the patient with  the results of the blood tests, and I     would like to see her back in the office in three months.  However, if     she keeps having breakthrough symptoms, she will call me and I will     consider adding low-dose domperidone.                                               Lionel December, M.D.    NR/MEDQ  D:  07/07/2002  T:  07/07/2002  Job:  [id number]   cc:   Madelin Rear. Sherwood Gambler, M.D.  [address]  [place]  [place] [address]  Fax: [phone]   Jesse Sans. Wall, M.D. [facility]

## 2010-09-26 NOTE — Discharge Summary (Signed)
NAMEPERLINE, AWE NO.:  [id number]   MEDICAL RECORD NO.:  [id number]          PATIENT TYPE:  INP   LOCATION:  A222                          FACILITY:  [facility]   PHYSICIAN:  Donna Bernard, M.D.DATE OF BIRTH:  05/24/1940   DATE OF ADMISSION:  11/16/2005  DATE OF DISCHARGE:  07/11/2007LH                                 DISCHARGE SUMMARY   FINAL DIAGNOSES:  1.  Chest pain, myocardial infarction ruled out.  2.  Reflux.  3.  Hyperlipidemia.  4.  Paroxysmal atrial fibrillation.   FINAL DISPOSITION:  1.  Patient discharged to home.  2.  Patient to maintain all current medications.  3.  Patient encouraged to take Nexium on a regular basis.  4.  Patient to followup with stress test as scheduled through the      cardiologist.   INITIAL HISTORY AND PHYSICAL:  Please see H&P that is dictated.   HOSPITAL COURSE:  This patient is a 70 year old female with a history of  hyperlipidemia, prior smoking, chronic reflux, paroxysmal atrial  fibrillation and recent diagnosis of colon cancer who presented to the  emergency department the day of admission with complaints of chest pain.  There was deep pressure-like substernal with radiation to the back and  improved after the administration of nitroglycerin. The patient gives  history of significant reflux and spotty intake of her Nexium. The patient  was admitted to the hospital, she ruled out for myocardial infarction with  serial cardiac enzymes. Carotid ultrasound was done and this came back  negative. The cardiologist felt that a stress test was indicated but this  could be set up on an outpatient basis and so they went ahead and set this  up. The date of discharge, the patient was feeling better, she was  asymptomatic and discharged home. Diagnosis and disposition as noted above.      Donna Bernard, M.D.  Electronically Signed     WSL/MEDQ  D:  11/25/2005  T:  11/25/2005  Job:  [id number]

## 2010-09-26 NOTE — Letter (Signed)
August 24, 2006    Dr. Assunta Found  [address]  [address]  [place], [place]  [address]   RE:  Lisa, [name]  MRN:  [id number]  /  DOB:  [birth year]   Dear [name]:   [name] returns to the office for continued assessment and treatment  of paroxysmal atrial fibrillation.  Since her last visit she has noted  some episodes of palpitations lasting minutes to hours.  These do not  feel as prominent as her previous episodes of AF.  She does not find  them particularly aversive and does not wish further diagnostic testing  with respect to that issue.   She recently was admitted to hospital for atypical chest discomfort.  She describes some recurrent right chest pressure without radiation and  without associated symptoms.  Evaluation in hospital was negative  including a normal echocardiogram, normal cardiac markers and no change  in EKGs.   CURRENT MEDICATIONS:  Are unchanged from her last visit.   EXAMINATION:  Trim, pleasant, somewhat anxious woman in no acute  distress.  The weight is 113, one pound more than in October 2007.  Blood pressure 105/75, heart rate 64 and regular, respirations 16.  NECK:  No jugular venous distention; normal carotid upstrokes without  bruits.  LUNGS:  Clear.  CARDIAC:  Normal first and second heart sounds; minimal systolic  ejection murmur.  Normal PMI.  ABDOMEN:  Soft and nontender; no organomegaly.  EXTREMITIES:  No edema.   Recent lipid profile in your office was acceptable with total  cholesterol of 195, triglycerides of 110, HDL of 69, and LDL of 104.  LFTs were normal.   Recent EKG:  Normal sinus rhythm; normal intervals; within normal  limits.   IMPRESSION:  [name] is doing very well.  She has read about the  potential adverse effects of flecainide and wonders if we should change.  I reassured her that this drug appears benign in her case and appears to  be working well.  She will continue her current medications  and return  to see me in 6 months.    Sincerely,      Gerrit Friends. Dietrich Pates, MD, [facility]  Electronically Signed    RMR/MedQ  DD: 08/24/2006  DT: 08/24/2006  Job #: [id number]

## 2010-09-26 NOTE — H&P (Signed)
[name], [name]                 ACCOUNT NO.:  [id number]   MEDICAL RECORD NO.:  [id number]          PATIENT TYPE:  AMB   LOCATION:  DAY                           FACILITY:  [facility]   PHYSICIAN:  Jerolyn Shin C. Katrinka Blazing, M.D.   DATE OF BIRTH:  1940-05-16   DATE OF ADMISSION:  DATE OF DISCHARGE:  LH                                HISTORY & PHYSICAL   HISTORY OF PRESENT ILLNESS:  A 70 year old female with a history of invasive  adenocarcinoma of the sigmoid colon.  She had a polypectomy by Dr. Karilyn Cota on  March 06, 2005.  She was found to have a polyp at 30 cm which showed  invasive adenocarcinoma centrally on the surface of the polyp and extension  of tumor to the cauterized margin.  The patient is scheduled to have  segmental sigmoid colectomy.   PAST HISTORY:  1.  Prior history of congestive heart failure.  2.  Gastroesophageal reflux disease.  3.  Seasonal allergies.   SURGERY:  1.  Total abdominal hysterectomy.  2.  Cholecystectomy.  3.  __________.   MEDICATIONS:  1.  Aspirin 325 mg daily.  2.  Flecainide 100 mg two daily.  3.  Lipitor 20 mg daily.  4.  Nexium 40 mg daily.  5.  Multivitamin once daily.  6.  Tums p.r.n.   ALLERGIES:  None.   FAMILY HISTORY:  Positive for hypertension, heart disease, leukemia.   SOCIAL HISTORY:  She is married.  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  She does not drink, smoke or use drugs.   PHYSICAL EXAMINATION:  VITAL SIGNS:  Blood pressure 118/70, pulse 74,  respirations 20, weight 119 pounds.  HEENT:  Unremarkable.  NECK:  Supple.  No JVD, bruit, adenopathy, or thyromegaly.  CHEST:  Clear to auscultation.  HEART:  Regular rate and rhythm without murmur, gallop, or rub.  There is no  evidence of an irregular rhythm.  ABDOMEN:  Soft, nontender.  No masses.  EXTREMITIES:  No clubbing, cyanosis, or edema.  NEUROLOGIC:  No focal motor, sensory, or cerebellar deficits.   IMPRESSION:  1.  Invasive adenocarcinoma, sigmoid  colon polyp.  2.  History of chronic atrial fibrillation, presently stable.  3.  Gastroesophageal reflux disease.  4.  Seasonal allergies.   PLAN:  Preoperative sigmoidoscopy followed by a sigmoid colectomy.      Dirk Dress. Katrinka Blazing, M.D.  Electronically Signed     LCS/MEDQ  D:  03/26/2005  T:  03/27/2005  Job:  [id number]

## 2010-09-26 NOTE — Op Note (Signed)
NAMEVERGIA, CHEA                 ACCOUNT NO.:  [id number]   MEDICAL RECORD NO.:  [id number]          PATIENT TYPE:  AMB   LOCATION:  DAY                           FAC[facility]:  [facility]   PHYSICIAN:  Lionel December, M.D.    DATE OF BIRTH:  02-Mar-1941   DATE OF PROCEDURE:  03/05/2005  DATE OF DISCHARGE:                                 OPERATIVE REPORT   PROCEDURE:  Colonoscopy with polypectomy.   INDICATIONS:  [name] is 70 year old Caucasian female who has been  constipated since she was begun on flecainide for arrhythmia. She denies  rectal bleeding, abdominal pain, anorexia, weight loss and family history is  negative for CRC. She did have a sigmoidoscopy and 1999 which was  unremarkable.   Procedure was reviewed with the patient, informed consent was obtained.   PREMEDICATION:  Demerol 50 mg IV, Versed 6 mg IV.   FINDINGS:  Procedure performed in endoscopy suite. The patient's vital signs  and O2 sat were monitored during the procedure and remained stable. The  patient was placed in the left lateral recumbent position. Rectal  examination was performed. No abnormality noted on external or digital exam.  Olympus videoscope was placed in the rectum and advanced under vision into  sigmoid colon and beyond. Preparation was satisfactory. Scope was passed  into cecum which was identified by ileocecal valve. Cecum was well seen and  was normal. Pictures were taken for the record. As the scope was withdrawn  colonic mucosa was carefully examined. There was a flat or sessile polyp  with central depression at sigmoid colon about 30 cm from the anal margin.  This was about 15 mm in diameter. This was raised with submucosal injection  of saline and it was snared. Polypectomy or submucosal resection was felt to  be complete. This polyp was retrieved. Endoscope was passed again. There was  a second polyp just below this which was about 8 mm. This was snared and  retrieved for histologic  examination. Mucosa of the rest of the sigmoid  colon and rectum was normal. Scope was retroflexed to examine anorectal  junction which was unremarkable. Endoscope was straightened and withdrawn.  The patient tolerated the procedure well.   FINAL DIAGNOSES:  1.  Sessile 15 mm polyp snared from sigmoid colon as described above.      Polypectomy is felt to be complete. I am concerned this polyp may have      intramucosal risk of carcinoma but we will have to wait for histology.  2.  An 8 mm polyp was also snared from sigmoid colon.   RECOMMENDATIONS:  1.  Standard instructions given.  2.  Lactulose 1-2 tablespoonful q.h.s.  3.  Citrucel 4 grams q.d.   I will be contacting the patient with biopsy results and further  recommendations.      Lionel December, M.D.  Electronically Signed     NR/MEDQ  D:  03/05/2005  T:  03/05/2005  Job:  [id number]   cc:   Madelin Rear. Sherwood Gambler, MD  Fax: [phone]

## 2010-09-26 NOTE — Op Note (Signed)
[name]                 ACCOUNT NO.:  [id number]   MEDICAL RECORD NO.:  [id number]          PATIENT TYPE:  AMB   LOCATION:  DAY                           FAC[facility]  [facility]   PHYSICIAN:  Lionel December, M.D.    DATE OF BIRTH:  1940/07/12   DATE OF PROCEDURE:  03/05/2006  DATE OF DISCHARGE:                                 OPERATIVE REPORT   PROCEDURE:  Esophagogastroduodenoscopy followed by colonoscopy.   [name] is a 70 year old Caucasian female who has chronic GERD and is  maintained on PPI.  However, she is having the left anterior and right upper  quadrant pain, usually of postprandial and relieved with antacids.  She has  undergone cardiac evaluation and felt to have noncardiac pain.  She is,  therefore, undergoing diagnostic EGD.  She has had cholecystectomy.  She is  also undergoing surveillance colonoscopy.  Last year she had a screening  colonoscopy and was found to have a tubular adenoma, another sessile polyp  with invasive carcinoma leading to segmental resection of her sigmoid colon.  She has not had any symptoms pertaining to her lower GI tract.  The  procedures and risks were reviewed with the patient and informed consent was  obtained.   MEDS FOR CONSCIOUS SEDATION:  Benzocaine spray for pharyngeal topical  anesthesia, Demerol 50 mg IV, Versed 5 mg IV.   FINDINGS:  The procedures were performed in the endoscopy suite.  The  patient's vital signs and O2 sat were monitored during the procedure and  remained stable.   PROCEDURE:  1. Esophagogastroduodenoscopy.  The patient was placed in the left lateral      position and Olympus videoscope was passed via oropharynx without any      difficulty into esophagus.   Esophagus:  The mucosa of the esophagus is normal.  The GE junction was at  38 cm from the from the incisors and no hernia was noted.   Stomach:  The stomach was empty and distended very well with insufflation.  The folds of the proximal stomach were  normal.  Examination of the mucosa at  body, antrum, pyloric channel as well as angularis, fundus and cardia was  normal.   Duodenum:  The bulbar mucosa was normal.  The scope was passed to the second  part of the duodenum where mucosa and folds were normal.  The endoscope was  withdrawn.  The patient was prepared for procedure #2.   1. Colonoscopy.  Rectal examination performed.  No abnormality noted on      external or digital exam.  The Olympus videoscope was placed in the      rectum and advanced under vision into a tortuous sigmoid colon.      Anastomosis was at 28 cm from the anal margin marked by a silk suture      and a few diverticula in this area.  It was wide open.  Pictures were      taken for the record.  The scope was passed to the proximal sigmoid      colon and beyond.  The  preparation was excellent.  The scope was passed      to the cecum where she had some stool which was washed away.  Pictures      were taken of the appendiceal orifice and the valve.  As the scope was      withdrawn, the colonic mucosa was examined a second time and there were      no polyps and/or tumor masses noted.  Rectal mucosa, similarly, was      normal.  The scope was retroflexed to examine the anorectal junction      which was unremarkable.  The endoscope was straightened and withdrawn.      The patient tolerated the procedure well.   FINAL DIAGNOSIS:  1. Normal esophagogastroduodenoscopy.  2. A few diverticula at colonic anastomosis, otherwise, normal colonoscopy      without evidence of recurrent polyp or neoplasm.   The etiology of her chest and right sided abdominal pain remains unclear.   PLAN:  1. I will check her LFTs today.  2. Levsin SL t.i.d. p.r.n.  3. Prescription given for lactulose, 1-2 tablespoonful q.h.s. one quad      with refills for up to a year.   I will be contacting the patient with results of LFTs.  She will need a  repeat colonoscopy in three years from  now.      Lionel December, M.D.  Electronically Signed     NR/MEDQ  D:  03/05/2006  T:  03/06/2006  Job:  [id number]   cc:   Lorin Picket A. Gerda Diss, MD  Fax: [phone]   Jeannine Boga  Fax: [phone]

## 2010-09-26 NOTE — Discharge Summary (Signed)
NAMEANAELLE, DUNTON                 ACCOUNT NO.:  [id number]   MEDICAL RECORD NO.:  [id number]          PATIENT TYPE:  INP   LOCATION:  A325                          FACILITY[facility]  [facility]   PHYSICIAN:  Scott A. Gerda Diss, MD    DATE OF BIRTH:  1940-10-28   DATE OF ADMISSION:  11/08/2007  DATE OF DISCHARGE:  07/05/2009LH                               DISCHARGE SUMMARY   DISCHARGE DIAGNOSIS:  Partial bowel obstruction.   HOSPITAL COURSE:  The patient was admitted in with vomiting, abdominal  pain, bloating and was determined to have a small bowel obstruction most  likely partial in nature.  She had had previous major surgery resection  and it was thought that most likely this was adhesions.  She has a  history of colon cancer.  It should be noted that she did have a scan  done, I did not see any signs of any tumor.  Dr. Leticia Penna consulted in on  her  and basically she was on a very slow path of improvement requiring  n.p.o. for quite sometime.  Finally, on November 13, 2007, was able to take  some soft diet and liquids and keep it down, and it was felt that she  was stable to be discharged home.  She was instructed to follow up with  [place] within 1-2 weeks.  Followup with the surgeon and if reoccurrence, may  consider a possibility of having a surgery especially after complete  small bowel obstruction.      Scott A. Gerda Diss, MD  Electronically Signed     SAL/MEDQ  D:  01/06/2008  T:  01/06/2008  Job:  [id number]

## 2010-09-26 NOTE — Discharge Summary (Signed)
[name], [name]                 ACCOUNT NO.:  [id number]   MEDICAL RECORD NO.:  [id number]          PATIENT TYPE:  INP   LOCATION:  A216                          FACILITY:  [facility]   PHYSICIAN:  Dirk Dress. Katrinka Blazing, M.D.   DATE OF BIRTH:  [birth year]   DATE OF ADMISSION:  03/27/2005  DATE OF DISCHARGE:  11/25/2006LH                                 DISCHARGE SUMMARY   DISCHARGE DIAGNOSIS:  1.  Invasive adenocarcinoma sigmoid colon polyp.  2.  Chronic atrial fibrillation.  3.  Gastroesophageal reflux disease.  4.  Postoperative supraventricular tachycardia.   SPECIAL PROCEDURE:  Left colectomy.   DISPOSITION:  Patient discharged home in stable satisfactory condition.   DISCHARGE MEDICATIONS:  1.  Flecainide 100 mg twice daily.  2.  Zelnorm 6 mg twice daily.  3.  Potassium 20 mEq two twice daily.  4.  Tylox 5/500 one every four hours as needed for pain.   Patient scheduled to be seen in the office in two weeks.  She will continue  her medications including aspirin, Lipitor, Centrum Silver, Nexium, and  MiraLax.   SUMMARY:  70 year old female with a history of invasive adenocarcinoma of  the colon.  She had a polypectomy by Dr. Gerilyn Nestle on March 06, 2005.  She was  found to have a polyp at 30 cm which showed invasive adenocarcinoma  centrally with extension of tumor to the margin.  She was scheduled for  segmental sigmoid colectomy.  She had a prior history of congestive heart  failure, chronic atrial fibrillation which was controlled with flecainide.  The patient was admitted through day surgery.  She went intraoperative  colonoscopy to verify the position of the tumor.  She then had a segmental  resection uneventfully.  She developed mild tachycardia and initially was  placed on Lopressor but this dropped her blood pressure in the 90s.  She was  then switched to IV Cardizem and digoxin since the Lopressor caused  significant hypotension.  She was controlled with this.  She was seen  in  consultation by cardiology who agreed with the diltiazem drip.  She had no  further problems.  She had return  of intestinal function.  She was tapered off of her Cardizem and switched to  flecainide.  Diet was advanced without difficulty.  She was switched to oral  Cardizem on November 24.  She had no further problems and she was discharged  home on the 25th in satisfactory condition.      Dirk Dress. Katrinka Blazing, M.D.  Electronically Signed     LCS/MEDQ  D:  06/09/2005  T:  06/09/2005  Job:  [id number]

## 2010-09-26 NOTE — Consult Note (Signed)
[name], [name] NO.:  [id number]   MEDICAL RECORD NO.:  [id number]          PATIENT TYPE:  INP   LOCATION:  A222                          FACILITY:  [facility]   PHYSICIAN:  [name], M.D. [facility] BIRTH:  05-Dec-1940   DATE OF CONSULTATION:  11/17/2005  DATE OF DISCHARGE:                                   CONSULTATION   REFERRING PHYSICIAN:  Donna [name], M.D.   PRIMARY CARDIOLOGIST:  [name], M.D.   HISTORY OF PRESENT ILLNESS:  A 70 year old woman without known coronary  disease, previously evaluated for paroxysmal atrial fibrillation.  Symptoms  related to that arrhythmia has been well-controlled with flecainide.  She is  low risk for thromboembolism has not been anticoagulated.  She was evaluated  for coronary disease with catheterization in 1999 and found to have no  significant obstructive disease.  A treadmill stress test to rule out a  proarrhythmic effect from flecainide was negative in August of 2006.  Initial echocardiogram in 2005 was normal except for mild mitral  regurgitation.  She had a stress nuclear study in October 2002 that was  negative.   Yesterday she developed episodic sharp lower substernal chest discomfort.  There was no relationship to exertion.  There were no associated symptoms.  There was no radiation.  There was no relationship to meals.  She used  Rolaids and Mylanta, perhaps with intermittent relief, but there was  repeated recurrence.  She took Nexium with no apparent benefit.  She came to  the emergency department, where admission was advised.   PAST MEDICAL HISTORY:  Notable for GERD.  She has been evaluated by Dr.  Karilyn [name] for this in the past.  She uses Nexium as needed for symptoms.  She  has hyperlipidemia.   MEDICATIONS PRIOR TO ADMISSION:  1.  Atorvastatin 20 mg daily.  2.  Flecainide 100 mg daily.  3.  Nexium 40 mg daily p.r.n.  4.  Tylenol p.r.n.   She has no known allergies.   PAST  SURGICAL HISTORY:  Notable for abdominal hysterectomy, cholecystectomy  and resection of adenocarcinoma of the colon.   SOCIAL HISTORY:  Lives in [place] and works in a Science writer; prior  tobacco use that was discontinued in 2003.  No excessive use of alcohol.   FAMILY HISTORY:  Notable for her mother, who died following CABG surgery for  coronary disease.  Her father is alive at age 59 but also has coronary  disease and hypertension.  She has 4 living siblings, none with vascular  disease.   REVIEW OF SYSTEMS:  Generally negative.  She has intermittent constipation.   PHYSICAL EXAMINATION:  GENERAL:  On exam, a pleasant woman in no acute  distress.  VITAL SIGNS:  The weight is heart rate is 55 and regular, respirations 18,  blood pressure 135/65, temperature 98.6.  HEENT:  Pupils equal, round, react to light; EOMs full; normal oral mucosa.  NECK:  No jugular venous distension; normal carotid upstrokes with bruit on  the left.  ENDOCRINE:  No thyromegaly.  HEMATOPOIETIC:  No adenopathy.  SKIN:  No significant lesions.  LUNGS:  Clear.  CARDIAC:  Normal first and second heart sounds; fourth heart sound present;  modest systolic murmur; normal PMI.  ABDOMEN:  Soft and nontender; no organomegaly; no masses.  EXTREMITIES:  No edema; normal distal pulses.  PSYCHIATRIC:  Alert and oriented; normal affect.   Laboratory is unremarkable.  Initial cardiac markers were negative in the  emergency department.  Troponin this morning is borderline at 0.06 with a  negative MB and total CK.  EKG:  Sinus bradycardia; sinus arrhythmia;  incomplete right bundle branch block; delayed R-wave progression; minor  nonspecific inferior ST - T-wave abnormalities.  When compared to November 16, 2005, ST-T wave abnormalities are somewhat more prominent.   IMPRESSION:  [name] presents with an atypical chest discomfort but a  borderline troponin and the suggestion of dynamic EKG abnormalities.  She   has no known vascular disease but does have a carotid bruit.  A vascular  ultrasound study will be obtained as well as serial EKGs and cardiac  markers.  If there are worsening or persistent abnormalities, she will  require repeat catheterization although prior negative coronary angiogram  and negative stress tests are somewhat reassuring.  She cannot receive beta  blocker due to bradyarrhythmias.  She is being treated with aspirin and low  molecular-weight heparin.  Lipid control has been good in the past.  Flecainide level has been normal.  There are no symptoms to suggest  recurrent paroxysmal atrial fibrillation.      [name], M.D. [facility]  Electronically Signed     RR/MEDQ  D:  11/17/2005  T:  11/17/2005  Job:  [id number]

## 2010-09-26 NOTE — Op Note (Signed)
[name], [name]                 ACCOUNT NO.:  [id number]   MEDICAL RECORD NO.:  [id number]          PATIENT TYPE:  AMB   LOCATION:  DAY                           FACILITY:  [facility]   PHYSICIAN:  Jerolyn Shin C. Katrinka Blazing, M.D.   DATE OF BIRTH:  12/21/1940   DATE OF PROCEDURE:  03/27/2005  DATE OF DISCHARGE:                                 OPERATIVE REPORT   PREOPERATIVE DIAGNOSIS:  Adenocarcinoma of sigmoid colon.   POSTOPERATIVE DIAGNOSIS:  Adenocarcinoma of sigmoid colon.   PROCEDURE:  Flexible sigmoidoscopy, followed by left partial colectomy.   SURGEON:  Dr. Katrinka Blazing.   DESCRIPTION:  The patient had general anesthesia induced. She was then  placed in lithotomy position. Sigmoid colonoscope was introduced and  maneuvered to 80 cm. Scope was gradually pulled back. At a distance of about  30 to 35 cm, the previous biopsy site was loaded. There were no other sites  proximal or distal to this. Scope was pulled back to about 15 cm, and no  lesion was found. Scope was then advanced back to the lesion. The lesion was  infiltrated with methylene blue around the lesion. Scope was withdrawn, and  the air was suctioned out. The patient was prepped and draped in a sterile  field. Midline incision was made. There was some spillage of methylene blue  in the left gutter, but otherwise the area of the lesion was noted. There  was also some thickening of the bowel wall in this area. The left colon was  mobilized along the line of [place] from the distal peritoneal reflection up  to the splenic flexure. The area of the resection was identified. A segment  about 6 inches proximal and distal to the lesion was chosen. Mesenteric to  this area was dissected. It was serially clamped with Kelly clamps, divided,  and controlled with ligatures of 2-0 silk. The bowel was divided proximally  and distally with the GIA. The specimen was placed on a back table and was  inverted in order to verify that the mass had been  found. The area was  clearly contained in the central portion of the lesion. Repeat gloving was  carried out. Irrigation was carried out. Side to side anastomosis was  performed using GIA60 and a TA55 stapler. The anastomosis was stable and  under no tension. Further irrigation was carried out. Sponge, needle,  instrument and blade counts were verified as correct. Bowel was reintroduced  into the peritoneal cavity. Fascia was closed with running #1 Prolene.  Subcutaneous tissue was closed with 2-0  Monocryl. Skin was closed with staples. Dressing was placed. The patient  tolerated the procedure well. She was awakened from anesthesia uneventfully,  traversed to a bed, and taken to the post-anesthesia care unit for further  monitoring.      Dirk Dress. Katrinka Blazing, M.D.  Electronically Signed     LCS/MEDQ  D:  03/27/2005  T:  03/27/2005  Job:  [id number]   cc:   Madelin Rear. Sherwood Gambler, MD  Fax: [phone]

## 2010-09-26 NOTE — Letter (Signed)
December 29, 2005     Dr. Assunta Found  [address]  [place], [place]  [address]   RE:  CARLISS, PORCARO  MRN:  [id number]  /  DOB:  [birth year]   Dear Vonna Kotyk:   Ms. Eggleton returns to the office for continued assessment and treatment of  chest discomfort following a recent admission to [facility]  Subsequent to discharge she underwent a stress nuclear study.  A brief  episode of atrial fibrillation occurred at peak exercise with dyspnea but no  chest discomfort.  There were marked baseline EKG abnormalities without  diagnostic changes during exertion.  Perfusion images were entirely normal.   Since she left the hospital Ms. Diefendorf has had a number of episodes of chest  discomfort.  This despite taking Nexium 40 mg twice a day.  She has added  Rolaids when she experiences symptoms with relief, but the duration of these  episodes may be 30-60 minutes.  There is no relation to exercise, meals nor  time of day.   PHYSICAL EXAMINATION:  GENERAL:  On exam, trim, pleasant woman in no acute  distress.  The weight is 115, 3 pounds more than five months ago.  VITAL SIGNS:  Blood pressure 110/70, heart rate 75 and regular, respirations  16.  NECK:  No jugular venous distention.  Normal carotid upstrokes without  bruits.  LUNGS:  Clear.  CARDIAC:  Normal first and second heart sounds.  Fourth heart sound present.  ABDOMEN:  Soft and nontender.  No bruits.  No organomegaly.  EXTREMITIES:  No edema.   IMPRESSION:  Ms. Oliveri continues symptomatic.  The etiology remains in  doubt.  If this were due to acid reflux I would certainly expect high dose  Nexium to provide better suppression of symptoms.  The likelihood of high  risk coronary disease is low in the setting of normal perfusion images;  however, she could have coronary artery spasm or less severe atherosclerotic  disease.  We will provide her sublingual nitroglycerin in the way of a  therapeutic trial.  She is cautioned to seek  immediate medical care if she  has prolonged or more severe symptoms.  I will reassess this nice woman in  two months.    Sincerely,      Gerrit Friends. Dietrich Pates, MD, [facility]   RMR/MedQ  DD:  12/29/2005  DT:  12/30/2005  Job #:  [id number]

## 2010-09-26 NOTE — H&P (Signed)
NAME:  [name], [name]                           ACCOUNT NO.:  [id number]   MEDICAL RECORD NO.:  [id number]                   PATIENT TYPE:  INP   LOCATION:  6524                                 FACILITY:  [facility]   PHYSICIAN:  [place] Bing, M.D.               DATE OF BIRTH:  13-Nov-1940   DATE OF ADMISSION:  08/28/2003  DATE OF DISCHARGE:                                HISTORY & PHYSICAL   REFERRING PHYSICIAN:  Hilario Quarry, M.D.   PRIMARY CARE PHYSICIAN:  __________.   PRIMARY CARDIOLOGIST:  Vida Roller, M.D.   HISTORY OF PRESENT ILLNESS:  A 70 year old woman  with a history of  palpitations an paroxysmal supraventricular tachycardia presents to the  emergency department with a prolonged symptomatic episode that included  malaise, fatigue, weakness, dizziness, and palpitations. In the emergency  department, she has had nearly constant atrial fibrillation with a rapid  ventricular response interrupted by a few sinus beats intermittently. She is  asymptomatic at rest.   Cardiac history dates back to at least 1999 when she underwent cardiac  catheterization. The results of that study were reportedly negative. She has  had palpitations intermittently for the past three years, but no arrhythmia  was documented until February of this year when there was a report of PSVT.  The duration and exact nature of that arrhythmia are not clear. She was  evaluated by Dr. Lorenz Coaster who recommended an increased dose of beta blocker.  She has continued to have brief episodes of palpitations until this morning  when she had a three hour episode. She was able to go to work, but symptoms  recurred prompting her to come to the emergency department.   Other cardiac workup includes a negative echocardiogram except for the  presence of mild mitral regurgitation in February and a negative outpatient  stress test after discharge. She reportedly has had normal TSH in the past.   PAST MEDICAL HISTORY:   Notable for hyperlipidemia treated pharmacologically.  She has had GERD.   PAST SURGICAL HISTORY:  A cholecystectomy and hysterectomy.   ALLERGIES:  No known drug allergies.   CURRENT MEDICATIONS:  1. Atorvastatin 20 mg daily.  2. Nexium 20 mg daily.  3. Multivitamin.  4. Atenolol 25 mg b.i.d.  5. Aspirin 81 mg daily.   SOCIAL HISTORY:  Lives in [place] with her husband. Employed as an Insurance claims handler. A 40-pack-year of cigarette smoking that was discontinued within the  past few years. No excessive alcohol use.   FAMILY HISTORY:  Mother has coronary artery disease and underwent an  uncomplicated CABG surgical procedure.   REVIEW OF SYSTEMS:  Occasional episodes of diaphoresis and dizziness. Post  menopausal. All other systems reviewed and are negative.   PHYSICAL EXAMINATION:  GENERAL:  Pleasant, well-appearing trim woman in no  acute distress.  VITAL SIGNS:  Temperature 98.6, heart rate 120 and irregular, respirations  20,  blood pressure 100/50. O2 saturation 100% on 2 L nasal O2.  HEENT:  Anicteric sclerae.  NECK:  No jugular vein distention. No carotid bruit.  SKIN:  No significant lesions.  ENDOCRINE:  No thyromegaly.  HEMATOPOIETIC:  No adenopathy.  LUNGS:  Clear.  CARDIAC:  Normal first and second heart sounds. Rapid irregular rhythm.  Modest systolic ejection murmur.  ABDOMEN:  Soft and nontender. Normal bowel sounds. No bruits. No  organomegaly.  EXTREMITIES:  1+ distal pulses. No edema.  NEUROMUSCULAR:  Symmetric strength and tone.  MUSCULOSKELETAL:  No joint deformities.   LABORATORY DATA:  EKG:  Sinus rhythm with multiple bursts of atrial  fibrillation. Delayed R-wave progression - cannot exclude previous anterior  myocardial infarction. ST-T wave abnormalities consistent with inferolateral  ischemia.   Other laboratory studies include a normal  CBC, normal LFTs, normal  electrolytes, normal renal function, normal initial cardiac markers.   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 She is significantly symptomatic. She  initially objected to admission, but her husband prevailed upon her to be  transferred to [facility] where anti-arrhythmic therapy will be  initiated. She will be seen by Dr. Ladona Ridgel with whom she was scheduled to  have an appointment later this month. Ablation of focal atrial fibrillation  is an option, but antiarrhythmic therapy would be my first choice. Since she  had no structural heart disease a number of medications could be selected.  We will start with Sotalol at a dose of 80 mg t.i.d. If that is efficacy the  dose will be decreased to b.i.d. Since she is low risk for thrombo-embolism  and has a significant amount of sinus rhythm, anticoagulation will not be  started at the present time.   Hyperlipidemia.  Control will be assessed with a lipid profile. A TSH level  will be obtained.                                                [place] Bing, M.D.    RR/MEDQ  D:  08/28/2003  T:  08/29/2003  Job:  [id number]

## 2010-09-29 ENCOUNTER — Ambulatory Visit (HOSPITAL_COMMUNITY)
Admission: RE | Admit: 2010-09-29 | Discharge: 2010-09-29 | Disposition: A | Discharge status: Home / Self Care | Payer: Medicare Other | Source: Ambulatory Visit | Attending: Internal Medicine | Admitting: Internal Medicine

## 2010-09-29 DIAGNOSIS — R1110 Vomiting, unspecified: Secondary | ICD-10-CM

## 2010-09-29 DIAGNOSIS — R1900 Intra-abdominal and pelvic swelling, mass and lump, unspecified site: Secondary | ICD-10-CM | POA: Insufficient documentation

## 2010-09-29 DIAGNOSIS — K5989 Other specified functional intestinal disorders: Secondary | ICD-10-CM | POA: Insufficient documentation

## 2010-09-29 DIAGNOSIS — Z9049 Acquired absence of other specified parts of digestive tract: Secondary | ICD-10-CM | POA: Insufficient documentation

## 2010-09-29 DIAGNOSIS — R109 Unspecified abdominal pain: Secondary | ICD-10-CM | POA: Insufficient documentation

## 2010-09-29 DIAGNOSIS — R110 Nausea: Secondary | ICD-10-CM

## 2010-09-29 MED ORDER — IOHEXOL 300 MG/ML  SOLN
100.0000 mL | Freq: Once | INTRAMUSCULAR | Status: AC | PRN
  Administered 2010-09-29: 100 mL via INTRAVENOUS

## 2010-11-27 ENCOUNTER — Encounter (INDEPENDENT_AMBULATORY_CARE_PROVIDER_SITE_OTHER): Payer: Self-pay | Admitting: General Surgery

## 2010-12-02 ENCOUNTER — Encounter (INDEPENDENT_AMBULATORY_CARE_PROVIDER_SITE_OTHER): Payer: Self-pay | Admitting: General Surgery

## 2010-12-05 ENCOUNTER — Encounter (INDEPENDENT_AMBULATORY_CARE_PROVIDER_SITE_OTHER): Payer: Self-pay | Admitting: General Surgery

## 2010-12-10 ENCOUNTER — Encounter (INDEPENDENT_AMBULATORY_CARE_PROVIDER_SITE_OTHER): Payer: Self-pay | Admitting: General Surgery

## 2010-12-11 ENCOUNTER — Encounter (INDEPENDENT_AMBULATORY_CARE_PROVIDER_SITE_OTHER): Payer: Self-pay | Admitting: General Surgery

## 2010-12-11 ENCOUNTER — Ambulatory Visit (INDEPENDENT_AMBULATORY_CARE_PROVIDER_SITE_OTHER): Payer: Medicare Other | Admitting: General Surgery

## 2010-12-11 VITALS — BP 118/70 | Temp 97.2°F | Ht 62.0 in | Wt 112.0 lb

## 2010-12-11 DIAGNOSIS — K5904 Chronic idiopathic constipation: Secondary | ICD-10-CM

## 2010-12-11 DIAGNOSIS — K5909 Other constipation: Secondary | ICD-10-CM

## 2010-12-11 DIAGNOSIS — Z85038 Personal history of other malignant neoplasm of large intestine: Secondary | ICD-10-CM

## 2010-12-11 NOTE — Patient Instructions (Signed)
Continue with the MiraLax on a chronic basis. I will follow Dr. Karilyn Cota today and see [place] only p.r.n. basis

## 2010-12-11 NOTE — Progress Notes (Signed)
Subjective:     Patient ID: [name], female   DOB: Oct 01, 1940, 70 y.o.   MRN: [id number]  HPI The patient returns she is now approximately 2 months since I last saw her at which time she had been referred by Dr. Karilyn Cota her gastroenterologist and rays possible possibly scar tissue from an old sigmoid colon surgery for cancer years ago I reviewed her CT is at that time and noted that she had a moderate amount of stool in the sigmoid colon roughly at the area of her anastomosis and placed on MiraLax and she says she's had no bowel problems  She's not lost weight she is not anemic her last colonoscopy was approximately 2 years ago and at that time Dr. told her there was no evidence of any stricture or problems at the anastomosis since then he is said that she probably has scar to assure and wonders if she might need surgery but I think if she can be managed with irregularities shin of her stools with MiraLax and was certainly not planning any surgery unless there was obvious stricture also been noted on a colonoscopy of concern on the CT there was certainly no evidence of any mass effect or stricture or anything noted of significance done in May 21.  She states she does have a low belch and that she did used to have and this has been supplemented she's on an acid reflux regimen for that and she has had her gallbladder removed occasionally she is aware that she is a little floor on the left side than on the right and I expected that related to constipation  Review of Systems     Objective:   Physical Exam On exam she is in no acute distress she is denied having any pain at this time and her vital signs are normal on examination of her abdomen there is no evidence of any weakness or problems were removed the suture that had been used to close her incision and on rectal exam there is no peritoneal stool in her rectum its Hemoccult negative in the chart and nothing that feels like there constipation      Assessment:    I would not plan on any type of surgery unless she was ballotable symptoms and she is presently having and the symptoms could not be controlled with MiraLax she says her next scheduled colonoscopy was in next fall and there is no blood in her stool and also infected spine to continue on that schedule     Plan:    I'll follow her gastroenterologist later today and I would only recommend that she continue with MiraLax I would not recommend any type first marker laparotomy and less she is having a lot more symptoms and she presently has her next colonoscopy is scheduled for one year

## 2011-02-05 LAB — DIFFERENTIAL
Basophils Absolute: 0
Basophils Absolute: 0
Basophils Relative: 0
Basophils Relative: 0
Eosinophils Absolute: 0
Eosinophils Absolute: 0.1
Eosinophils Relative: 0
Eosinophils Relative: 1
Lymphocytes Relative: 23
Lymphs Abs: 1.3
Monocytes Absolute: 0.5
Monocytes Absolute: 0.8
Monocytes Relative: 4
Neutrophils Relative %: 85 — ABNORMAL HIGH
Neutrophils Relative %: 57

## 2011-02-05 LAB — BASIC METABOLIC PANEL
BUN: 3 — ABNORMAL LOW
CO2: 27
CO2: 27
Chloride: 104
Chloride: 113 — ABNORMAL HIGH
Chloride: 115 — ABNORMAL HIGH
Creatinine, Ser: 0.8
Creatinine, Ser: 0.69
GFR calc Af Amer: >60
GFR calc non Af Amer: >60
Glucose, Bld: 156 — ABNORMAL HIGH
Potassium: 4
Potassium: 3.9
Sodium: 143

## 2011-02-05 LAB — CBC
HCT: 41.3
HCT: 34.2 — ABNORMAL LOW
Hemoglobin: 11.6 — ABNORMAL LOW
MCHC: 33.7
MCHC: 33.8
MCHC: 33.9
MCV: 100.1 — ABNORMAL HIGH
MCV: 100.3 — ABNORMAL HIGH
MCV: 101 — ABNORMAL HIGH
Platelets: 272
RBC: 4.12
RDW: 12.7
WBC: 6.5

## 2011-02-05 LAB — URINALYSIS, ROUTINE W REFLEX MICROSCOPIC
Glucose, UA: NEGATIVE
Hgb urine dipstick: NEGATIVE
Ketones, ur: 40 — AB
Protein, ur: NEGATIVE
pH: 6.5

## 2011-05-14 ENCOUNTER — Other Ambulatory Visit: Payer: Self-pay | Admitting: Family Medicine

## 2011-05-14 DIAGNOSIS — Z139 Encounter for screening, unspecified: Secondary | ICD-10-CM

## 2011-06-12 ENCOUNTER — Ambulatory Visit (HOSPITAL_COMMUNITY)
Admission: RE | Admit: 2011-06-12 | Discharge: 2011-06-12 | Disposition: A | Discharge status: Home / Self Care | Payer: Medicare Other | Source: Ambulatory Visit | Attending: Family Medicine | Admitting: Family Medicine

## 2011-06-12 DIAGNOSIS — Z1231 Encounter for screening mammogram for malignant neoplasm of breast: Secondary | ICD-10-CM | POA: Diagnosis not present

## 2011-06-12 DIAGNOSIS — Z139 Encounter for screening, unspecified: Secondary | ICD-10-CM

## 2011-06-18 ENCOUNTER — Encounter: Payer: Self-pay | Admitting: Cardiology

## 2011-06-18 DIAGNOSIS — I480 Paroxysmal atrial fibrillation: Secondary | ICD-10-CM | POA: Insufficient documentation

## 2011-06-19 ENCOUNTER — Encounter: Payer: Self-pay | Admitting: Cardiology

## 2011-06-19 ENCOUNTER — Ambulatory Visit (INDEPENDENT_AMBULATORY_CARE_PROVIDER_SITE_OTHER): Payer: Medicare Other | Admitting: Cardiology

## 2011-06-19 DIAGNOSIS — E785 Hyperlipidemia, unspecified: Secondary | ICD-10-CM

## 2011-06-19 DIAGNOSIS — I4891 Unspecified atrial fibrillation: Secondary | ICD-10-CM | POA: Diagnosis not present

## 2011-06-19 DIAGNOSIS — I480 Paroxysmal atrial fibrillation: Secondary | ICD-10-CM

## 2011-06-19 NOTE — Assessment & Plan Note (Addendum)
Adequately controlled hyperlipidemia in the absence of known vascular disease with moderate dose atorvastatin.  Patient inquired as to whether a more potent drug would be advisable or higher doses of her current drug.  I believe the current therapy provides an acceptable balance between risk and benefit.

## 2011-06-19 NOTE — Progress Notes (Signed)
Patient ID: [name], female   DOB: Feb 25, 1941, 71 y.o.   MRN: [id number] HPI: Scheduled return visit for this very pleasant, very fit woman with a history of paroxysmal atrial fibrillation.  No significant arrhythmia has been documented for many years.  She has done well since her most recent visit in 2012, noting occasional brief palpitations lasting seconds to a few minutes.  There is no associated dyspnea, diaphoresis, lightheadedness or syncope.  She described similar, perhaps worse, symptoms in 2011 at which time an event recorder showed no significant arrhythmias.  She continues to exercise regularly and denies all cardiopulmonary symptoms.  Prior to Admission medications   Medication Sig Start Date End Date Taking? Authorizing Provider  aspirin 81 MG tablet Take 160 mg by mouth daily.   Yes Historical Provider, MD  atorvastatin (LIPITOR) 40 MG tablet Take 40 mg by mouth daily.     Yes Historical Provider, MD  Calcium Citrate (CITRACAL PO) Take by mouth daily.     Yes Historical Provider, MD  dexlansoprazole (DEXILANT) 60 MG capsule Take 60 mg by mouth daily.     Yes Historical Provider, MD  fish oil-omega-3 fatty acids 1000 MG capsule Take 2 g by mouth daily.     Yes Historical Provider, MD  flecainide (TAMBOCOR) 100 MG tablet Daily. 12/01/10  Yes Historical Provider, MD  LACTULOSE PO Take by mouth daily.     Yes Historical Provider, MD  multivitamin-iron-minerals-folic acid (CENTRUM) chewable tablet Chew 1 tablet by mouth daily.     Yes Historical Provider, MD  polyethylene glycol (MIRALAX / GLYCOLAX) packet Take 17 g by mouth daily.   Yes Historical Provider, MD    No Known Allergies    Past medical history, social history, and family history reviewed and updated.  ROS: Denies chest discomfort, exertional dyspnea, orthopnea, PND, pedal edema, lightheadedness or syncope.  PHYSICAL EXAM: BP 110/52  Pulse 62  Ht 5\' 2"  (1.575 m)  Wt 49.896 kg (110 lb)  BMI 20.12 kg/m2    General-Well developed; no acute distress Body habitus-thin Neck-No JVD; no carotid bruits Lungs-clear lung fields; resonant to percussion Cardiovascular-normal PMI; normal S1 and S2; modest basilar systolic ejection murmur Abdomen-normal bowel sounds; soft and non-tender without masses or organomegaly Musculoskeletal-No deformities, no cyanosis or clubbing Neurologic-Normal cranial nerves; symmetric strength and tone Skin-Warm, no significant lesions Extremities-distal pulses intact; no edema  ASSESSMENT AND PLAN:  [place] Bing, MD 06/19/2011 4:20 PM

## 2011-06-19 NOTE — Assessment & Plan Note (Signed)
Ms. Belle continues to do extremely well with respect to PAF She continues on moderate dose flecainide with a low therapeutic level of 0.4 last year and no documented recurrent sustained arrhythmia.  As she ages, her risk of thromboembolism increases from low to moderate, but in the absence of definite prolonged episodes of AF, I would not initiate full anticoagulation.  Her dose of aspirin will be reduced to 81 mg per day.

## 2011-06-19 NOTE — Patient Instructions (Signed)
Your physician recommends that you schedule a follow-up appointment in: 1 YEAR  Decrease Aspirin to 81 mg  Your physician recommends that you return for lab work in: [facility] within the week

## 2011-06-20 LAB — COMPREHENSIVE METABOLIC PANEL
AST: 19 U/L (ref 0–37)
Albumin: 4 g/dL (ref 3.5–5.2)
Alkaline Phosphatase: 74 U/L (ref 39–117)
Potassium: 4.2 mEq/L (ref 3.5–5.3)
Sodium: 142 mEq/L (ref 135–145)
Total Protein: 6.4 g/dL (ref 6.0–8.3)

## 2011-06-23 ENCOUNTER — Encounter: Payer: Self-pay | Admitting: *Deleted

## 2011-06-29 ENCOUNTER — Other Ambulatory Visit: Payer: Self-pay | Admitting: Cardiology

## 2011-09-17 DIAGNOSIS — R5381 Other malaise: Secondary | ICD-10-CM | POA: Diagnosis not present

## 2011-09-17 DIAGNOSIS — Z79899 Other long term (current) drug therapy: Secondary | ICD-10-CM | POA: Diagnosis not present

## 2011-09-17 DIAGNOSIS — R1909 Other intra-abdominal and pelvic swelling, mass and lump: Secondary | ICD-10-CM | POA: Diagnosis not present

## 2011-09-17 DIAGNOSIS — E782 Mixed hyperlipidemia: Secondary | ICD-10-CM | POA: Diagnosis not present

## 2011-09-17 DIAGNOSIS — R5383 Other fatigue: Secondary | ICD-10-CM | POA: Diagnosis not present

## 2011-09-17 DIAGNOSIS — Z85038 Personal history of other malignant neoplasm of large intestine: Secondary | ICD-10-CM | POA: Diagnosis not present

## 2011-09-25 DIAGNOSIS — E785 Hyperlipidemia, unspecified: Secondary | ICD-10-CM | POA: Diagnosis not present

## 2011-09-25 DIAGNOSIS — M949 Disorder of cartilage, unspecified: Secondary | ICD-10-CM | POA: Diagnosis not present

## 2011-09-25 DIAGNOSIS — R7309 Other abnormal glucose: Secondary | ICD-10-CM | POA: Diagnosis not present

## 2011-09-25 DIAGNOSIS — D539 Nutritional anemia, unspecified: Secondary | ICD-10-CM | POA: Diagnosis not present

## 2011-09-25 DIAGNOSIS — R7301 Impaired fasting glucose: Secondary | ICD-10-CM | POA: Diagnosis not present

## 2011-09-25 DIAGNOSIS — M899 Disorder of bone, unspecified: Secondary | ICD-10-CM | POA: Diagnosis not present

## 2012-02-15 ENCOUNTER — Telehealth (INDEPENDENT_AMBULATORY_CARE_PROVIDER_SITE_OTHER): Payer: Self-pay | Admitting: *Deleted

## 2012-02-15 ENCOUNTER — Other Ambulatory Visit (INDEPENDENT_AMBULATORY_CARE_PROVIDER_SITE_OTHER): Payer: Self-pay | Admitting: *Deleted

## 2012-02-15 DIAGNOSIS — Z1211 Encounter for screening for malignant neoplasm of colon: Secondary | ICD-10-CM

## 2012-02-15 DIAGNOSIS — Z86010 Personal history of colonic polyps: Secondary | ICD-10-CM

## 2012-02-15 MED ORDER — PEG-KCL-NACL-NASULF-NA ASC-C 100 G PO SOLR: 1.0000 | Freq: Once | ORAL | Status: DC

## 2012-02-15 NOTE — Telephone Encounter (Signed)
Patient needs movi prep 

## 2012-02-25 ENCOUNTER — Other Ambulatory Visit: Payer: Self-pay | Admitting: Cardiology

## 2012-03-01 ENCOUNTER — Telehealth (INDEPENDENT_AMBULATORY_CARE_PROVIDER_SITE_OTHER): Payer: Self-pay | Admitting: *Deleted

## 2012-03-01 NOTE — Telephone Encounter (Signed)
PCP/Requesting MD: scott luking  Name & DOB: [name]  08/14/1940   Procedure: tcs  Reason/Indication:  Hx polyps  Has patient had this procedure before?  yes  If so, when, by whom and where?  03/2009  Is there a family history of colon cancer?  no  Who?  What age when diagnosed?    Is patient diabetic?   no      Does patient have prosthetic heart valve?  no  Do you have a pacemaker?  no  Has patient had joint replacement within last 12 months?  no  Is patient on Coumadin, Plavix and/or Aspirin? yes  Medications: asa 325 mg daily, lipitor 40 mg daily, flecainide 100 mg bid, fish oil, multi vit, calcium, lactulose, zegrid otc every other day  Allergies: nkda  Medication Adjustment: asa 2 days  Procedure date & time: 03/25/12 at 730

## 2012-03-02 DIAGNOSIS — Z23 Encounter for immunization: Secondary | ICD-10-CM | POA: Diagnosis not present

## 2012-03-03 NOTE — Telephone Encounter (Signed)
agree

## 2012-03-21 ENCOUNTER — Encounter (HOSPITAL_COMMUNITY): Payer: Self-pay | Admitting: Pharmacy Technician

## 2012-03-25 ENCOUNTER — Encounter (HOSPITAL_COMMUNITY): Payer: Self-pay

## 2012-03-25 ENCOUNTER — Encounter (HOSPITAL_COMMUNITY)
Admission: RE | Disposition: A | Discharge status: Home / Self Care | Payer: Self-pay | Source: Ambulatory Visit | Attending: Internal Medicine

## 2012-03-25 ENCOUNTER — Ambulatory Visit (HOSPITAL_COMMUNITY)
Admission: RE | Admit: 2012-03-25 | Discharge: 2012-03-25 | Disposition: A | Discharge status: Home / Self Care | Payer: Medicare Other | Source: Ambulatory Visit | Attending: Internal Medicine | Admitting: Internal Medicine

## 2012-03-25 DIAGNOSIS — Z09 Encounter for follow-up examination after completed treatment for conditions other than malignant neoplasm: Secondary | ICD-10-CM | POA: Insufficient documentation

## 2012-03-25 DIAGNOSIS — I4891 Unspecified atrial fibrillation: Secondary | ICD-10-CM | POA: Insufficient documentation

## 2012-03-25 DIAGNOSIS — Z85038 Personal history of other malignant neoplasm of large intestine: Secondary | ICD-10-CM | POA: Insufficient documentation

## 2012-03-25 DIAGNOSIS — Z87891 Personal history of nicotine dependence: Secondary | ICD-10-CM | POA: Insufficient documentation

## 2012-03-25 DIAGNOSIS — Z86010 Personal history of colonic polyps: Secondary | ICD-10-CM

## 2012-03-25 DIAGNOSIS — K219 Gastro-esophageal reflux disease without esophagitis: Secondary | ICD-10-CM | POA: Diagnosis not present

## 2012-03-25 DIAGNOSIS — Z7982 Long term (current) use of aspirin: Secondary | ICD-10-CM | POA: Diagnosis not present

## 2012-03-25 DIAGNOSIS — Z980 Intestinal bypass and anastomosis status: Secondary | ICD-10-CM | POA: Insufficient documentation

## 2012-03-25 DIAGNOSIS — Z79899 Other long term (current) drug therapy: Secondary | ICD-10-CM | POA: Diagnosis not present

## 2012-03-25 DIAGNOSIS — Z9049 Acquired absence of other specified parts of digestive tract: Secondary | ICD-10-CM | POA: Diagnosis not present

## 2012-03-25 DIAGNOSIS — E785 Hyperlipidemia, unspecified: Secondary | ICD-10-CM | POA: Insufficient documentation

## 2012-03-25 HISTORY — DX: Unspecified osteoarthritis, unspecified site: M19.90

## 2012-03-25 HISTORY — DX: Nausea with vomiting, unspecified: Z98.890

## 2012-03-25 HISTORY — PX: COLONOSCOPY: SHX5424

## 2012-03-25 HISTORY — DX: Nausea with vomiting, unspecified: R11.2

## 2012-03-25 SURGERY — COLONOSCOPY
Anesthesia: Moderate Sedation

## 2012-03-25 MED ORDER — MIDAZOLAM HCL 5 MG/5ML IJ SOLN
INTRAMUSCULAR | Status: DC | PRN
  Administered 2012-03-25 (×2): 2 mg via INTRAVENOUS

## 2012-03-25 MED ORDER — STERILE WATER FOR IRRIGATION IR SOLN
Status: DC | PRN
  Administered 2012-03-25: 07:00:00

## 2012-03-25 MED ORDER — MEPERIDINE HCL 50 MG/ML IJ SOLN
INTRAMUSCULAR | Status: DC | PRN
  Administered 2012-03-25: 25 mg via INTRAVENOUS

## 2012-03-25 MED ORDER — MIDAZOLAM HCL 5 MG/5ML IJ SOLN
INTRAMUSCULAR | Status: AC
  Filled 2012-03-25: qty 10

## 2012-03-25 MED ORDER — MEPERIDINE HCL 50 MG/ML IJ SOLN
INTRAMUSCULAR | Status: AC
  Filled 2012-03-25: qty 1

## 2012-03-25 MED ORDER — SODIUM CHLORIDE 0.45 % IV SOLN
INTRAVENOUS | Status: DC
  Administered 2012-03-25: 07:00:00 via INTRAVENOUS

## 2012-03-25 NOTE — H&P (Signed)
[name] is an 71 y.o. female.   Chief Complaint: Patient is here for colonoscopy. HPI: Patient 71 year old Caucasian female with history of colon carcinoma with sigmoid resection 7 years ago is here for surveillance colonoscopy. She continues to have sporadic episodes of abdominal pain and nausea. She says her bowels move regularly as long as she takes her medications. She denies rectal bleeding anorexia or weight loss. Patient's last colonoscopy was 3 years ago.  Past Medical History  Diagnosis Date  . Paroxysmal atrial fibrillation     Palpitations; maintained on flecainide; -normal coronary angiography and 1999; negative stress nuclear study in 11/2005  . Gastroesophageal reflux disease   . Hearing loss     mild  . Tobacco abuse, in remission     10-pack-years; quit in 2001  . 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 Hyperlipidemia   . Ovarian cyst 2008  . PONV (postoperative nausea and vomiting)   . Arthritis     Past Surgical History  Procedure Date  . Partial colectomy 2006    adenocarcinoma  . Cholecystectomy   . Abdominal hysterectomy 1983    partial  . Cosmetic surgery 09/2007    Family History  Problem Relation Age of Onset  . Heart disease Mother   . Stroke Mother   . Cancer Father     lung  . Heart disease Father   . Cancer Brother     rectal  . Hypertension Father   . Peripheral vascular disease Brother    Social History:  reports that she quit smoking about 10 years ago. She has never used smokeless tobacco. She reports that she does not drink alcohol or use illicit drugs.  Allergies: No Known Allergies  Medications Prior to Admission  Medication Sig Dispense Refill  . aspirin 81 MG tablet Take 160 mg by mouth daily.      Marland Kitchen atorvastatin (LIPITOR) 40 MG tablet Take 40 mg by mouth daily.        . Calcium Citrate (CITRACAL PO) Take by mouth daily.        . fish oil-omega-3 fatty acids 1000 MG capsule Take 2 g by mouth daily.         . flecainide (TAMBOCOR) 100 MG tablet TAKE ONE TABLET EVERY 12 HOURS  60 tablet  3  . lactulose (CHRONULAC) 10 GM/15ML solution Take 20 g by mouth at bedtime.      . multivitamin-iron-minerals-folic acid (CENTRUM) chewable tablet Chew 1 tablet by mouth daily.        Maxwell Caul Bicarbonate (ZEGERID OTC) 20-1100 MG CAPS Take 1 capsule by mouth daily before breakfast.      . peg 3350 powder (MOVIPREP) 100 G SOLR Take 1 kit (100 g total) by mouth once.  1 kit  0  . polyethylene glycol (MIRALAX / GLYCOLAX) packet Take 17 g by mouth daily.        No results found for this or any previous visit (from the past 48 hour(s)). No results found.  ROS  Blood pressure 130/62, pulse 66, temperature 97.2 F (36.2 C), temperature source Oral, resp. rate 22, height 5\' 2"  (1.575 m), weight 110 lb (49.896 kg), SpO2 100.00%. Physical Exam  Constitutional: She appears well-developed and well-nourished.  HENT:  Mouth/Throat: Oropharynx is clear and moist.  Eyes: Conjunctivae normal are normal.  Neck: No thyromegaly present.  Cardiovascular: Normal rate, regular rhythm and normal heart sounds.   No murmur heard. Respiratory: Effort normal  and breath sounds normal.  GI: Soft. She exhibits no distension. There is no tenderness.  Musculoskeletal: She exhibits no edema.  Lymphadenopathy:    She has no cervical adenopathy.  Neurological: She is alert.  Skin: Skin is warm and dry.     Assessment/Plan History of colon carcinoma.  Surveillance colonoscopy.  Catalia Massett U 03/25/2012, 7:35 AM

## 2012-03-25 NOTE — Op Note (Signed)
COLONOSCOPY PROCEDURE REPORT  PATIENT:  [name]  MR#:  [id number] Birthdate:  1940/07/26, 71 y.o., female Endoscopist:  Dr. Malissa Hippo, MD Referred By:  Dr. Lilyan Punt, MD Procedure Date: 03/25/2012  Procedure:   Colonoscopy  Indications:  Patient is 71 year old Caucasian female with history of colon carcinoma with sigmoid resection 7 years ago. Patient's last exam was 3 years ago. She is undergoing surveillance colonoscopy.  Informed Consent:  The procedure and risks were reviewed with the patient and informed consent was obtained.  Medications:  Demerol 25 mg IV Versed 4 mg IV  Description of procedure:  After a digital rectal exam was performed, that colonoscope was advanced from the anus through the rectum and colon to the area of the cecum, ileocecal valve and appendiceal orifice. The cecum was deeply intubated. These structures were well-seen and photographed for the record. From the level of the cecum and ileocecal valve, the scope was slowly and cautiously withdrawn. The mucosal surfaces were carefully surveyed utilizing scope tip to flexion to facilitate fold flattening as needed. The scope was pulled down into the rectum where a thorough exam including retroflexion was performed.  Findings:   Prep excellent. Normal mucosa throughout without diverticula polyps or other mucosal abnormalities. Colonic anastomosis at 20 cm from the anal margin. Normal rectal mucosa and anal rectal junction.   Therapeutic/Diagnostic Maneuvers Performed:  None  Complications:  None  Cecal Withdrawal Time:  10 minutes  Impression:  Examination performed to cecum. No evidence of recurrent polyps or other mucosal abnormalities. Wide-open colonic anastomosis at 20 cm from anal margin.  Recommendations:  Patient advised to discontinue lactulose but continue polyethylene glycol. Next colonoscopy in 5 years.  REHMAN,NAJEEB U  03/25/2012 8:06 AM  CC: Dr. Lilyan Punt, MD & Dr. Bonnetta Barry  ref. provider found

## 2012-03-28 ENCOUNTER — Encounter (HOSPITAL_COMMUNITY): Payer: Self-pay | Admitting: Internal Medicine

## 2012-04-13 DIAGNOSIS — Z79899 Other long term (current) drug therapy: Secondary | ICD-10-CM | POA: Diagnosis not present

## 2012-04-13 DIAGNOSIS — E785 Hyperlipidemia, unspecified: Secondary | ICD-10-CM | POA: Diagnosis not present

## 2012-04-19 ENCOUNTER — Other Ambulatory Visit: Payer: Self-pay | Admitting: Obstetrics and Gynecology

## 2012-04-19 DIAGNOSIS — N9489 Other specified conditions associated with female genital organs and menstrual cycle: Secondary | ICD-10-CM | POA: Diagnosis not present

## 2012-04-19 DIAGNOSIS — L940 Localized scleroderma [morphea]: Secondary | ICD-10-CM | POA: Diagnosis not present

## 2012-04-19 DIAGNOSIS — N83209 Unspecified ovarian cyst, unspecified side: Secondary | ICD-10-CM | POA: Diagnosis not present

## 2012-04-21 DIAGNOSIS — E785 Hyperlipidemia, unspecified: Secondary | ICD-10-CM | POA: Diagnosis not present

## 2012-04-21 DIAGNOSIS — E162 Hypoglycemia, unspecified: Secondary | ICD-10-CM | POA: Diagnosis not present

## 2012-05-23 ENCOUNTER — Other Ambulatory Visit: Payer: Self-pay | Admitting: Family Medicine

## 2012-05-23 DIAGNOSIS — Z139 Encounter for screening, unspecified: Secondary | ICD-10-CM

## 2012-06-13 ENCOUNTER — Ambulatory Visit (HOSPITAL_COMMUNITY)
Admission: RE | Admit: 2012-06-13 | Discharge: 2012-06-13 | Disposition: A | Discharge status: Home / Self Care | Payer: Medicare Other | Source: Ambulatory Visit | Attending: Family Medicine | Admitting: Family Medicine

## 2012-06-13 DIAGNOSIS — Z1231 Encounter for screening mammogram for malignant neoplasm of breast: Secondary | ICD-10-CM | POA: Insufficient documentation

## 2012-06-13 DIAGNOSIS — Z139 Encounter for screening, unspecified: Secondary | ICD-10-CM

## 2012-06-20 ENCOUNTER — Ambulatory Visit (INDEPENDENT_AMBULATORY_CARE_PROVIDER_SITE_OTHER): Payer: Medicare Other | Admitting: Cardiology

## 2012-06-20 ENCOUNTER — Encounter: Payer: Self-pay | Admitting: Cardiology

## 2012-06-20 VITALS — BP 99/62 | HR 68 | Ht 62.0 in | Wt 109.0 lb

## 2012-06-20 DIAGNOSIS — I10 Essential (primary) hypertension: Secondary | ICD-10-CM

## 2012-06-20 DIAGNOSIS — Z9189 Other specified personal risk factors, not elsewhere classified: Secondary | ICD-10-CM | POA: Diagnosis not present

## 2012-06-20 DIAGNOSIS — I4891 Unspecified atrial fibrillation: Secondary | ICD-10-CM | POA: Diagnosis not present

## 2012-06-20 DIAGNOSIS — I480 Paroxysmal atrial fibrillation: Secondary | ICD-10-CM

## 2012-06-20 DIAGNOSIS — E785 Hyperlipidemia, unspecified: Secondary | ICD-10-CM

## 2012-06-20 DIAGNOSIS — Z9289 Personal history of other medical treatment: Secondary | ICD-10-CM

## 2012-06-20 NOTE — Patient Instructions (Addendum)
Your physician recommends that you schedule a follow-up appointment in: ONE YEAR 

## 2012-06-20 NOTE — Progress Notes (Deleted)
Name: [name]    DOB: 03/15/1941  Age: 72 y.o.  MR#: [id number]       PCP:  Lilyan Punt, MD      Insurance: @PAYORNAME @   CC:   BOTTLES LABS FROM PCP 04-13-12 ENCLOSED   VS BP 99/62  Pulse 68  Ht 5\' 2"  (1.575 m)  Wt 109 lb (49.442 kg)  BMI 19.93 kg/m2  Weights Current Weight  06/20/12 109 lb (49.442 kg)  03/25/12 110 lb (49.896 kg)  03/25/12 110 lb (49.896 kg)    Blood Pressure  BP Readings from Last 3 Encounters:  06/20/12 99/62  03/25/12 92/46  03/25/12 92/46     Admit date:  (Not on file) Last encounter with RMR:  02/25/2012   Allergy No Known Allergies  Current Outpatient Prescriptions  Medication Sig Dispense Refill  . aspirin 81 MG tablet Take 81 mg by mouth daily.       Marland Kitchen atorvastatin (LIPITOR) 40 MG tablet Take 40 mg by mouth daily.        . Calcium Citrate (CITRACAL PO) Take by mouth daily.        . fish oil-omega-3 fatty acids 1000 MG capsule Take 2 g by mouth daily.       . flecainide (TAMBOCOR) 100 MG tablet TAKE ONE TABLET EVERY 12 HOURS  60 tablet  3  . Lactulose SOLN by Does not apply route. Takes 2 tablespoons      . Multiple Vitamins-Minerals (CENTRUM SILVER ADULT 50+ PO) Take by mouth.      . polyethylene glycol (MIRALAX / GLYCOLAX) packet Take 17 g by mouth daily.       No current facility-administered medications for this visit.    Discontinued Meds:    Medications Discontinued During This Encounter  Medication Reason  . multivitamin-iron-minerals-folic acid (CENTRUM) chewable tablet Error  . Omeprazole-Sodium Bicarbonate (ZEGERID OTC) 20-1100 MG CAPS Error    Patient Active Problem List  Diagnosis  . ADENOCARCINOMA, COLON  . HYPERLIPIDEMIA  . GASTROESOPHAGEAL REFLUX DISEASE  . Paroxysmal atrial fibrillation    LABS No visits with results within 3 Month(s) from this visit. Latest known visit with results is:  Office Visit on 06/19/2011  Component Date Value  . Sodium 06/19/2011 142   . Potassium 06/19/2011 4.2   . Chloride  06/19/2011 104   . CO2 06/19/2011 28   . Glucose, Bld 06/19/2011 81   . BUN 06/19/2011 24*  . Creat 06/19/2011 0.74   . Total Bilirubin 06/19/2011 0.3   . Alkaline Phosphatase 06/19/2011 74   . AST 06/19/2011 19   . ALT 06/19/2011 14   . Total Protein 06/19/2011 6.4   . Albumin 06/19/2011 4.0   . Calcium 06/19/2011 9.3      Results for this Opt Visit:     Results for orders placed in visit on 06/19/11  COMPREHENSIVE METABOLIC PANEL      Result Value Range   Sodium 142  135 - 145 mEq/L   Potassium 4.2  3.5 - 5.3 mEq/L   Chloride 104  96 - 112 mEq/L   CO2 28  19 - 32 mEq/L   Glucose, Bld 81  70 - 99 mg/dL   BUN 24 (*) 6 - 23 mg/dL   Creat 7.82  9.56 - 2.13 mg/dL   Total Bilirubin 0.3  0.3 - 1.2 mg/dL   Alkaline Phosphatase 74  39 - 117 U/L   AST 19  0 - 37 U/L   ALT 14  0 -  35 U/L   Total Protein 6.4  6.0 - 8.3 g/dL   Albumin 4.0  3.5 - 5.2 g/dL   Calcium 9.3  8.4 - 96.0 mg/dL    EKG Orders placed in visit on 09/17/09  . CONVERTED CEMR EKG     Prior Assessment and Plan Problem List as of 06/20/2012     ICD-9-CM   ADENOCARCINOMA, COLON   HYPERLIPIDEMIA   Last Assessment & Plan   06/19/2011 Office Visit Edited 06/20/2011  8:38 AM by Kathlen Brunswick, MD     Adequately controlled hyperlipidemia in the absence of known vascular disease with moderate dose atorvastatin.  Patient inquired as to whether a more potent drug would be advisable or higher doses of her current drug.  I believe the current therapy provides an acceptable balance between risk and benefit.    GASTROESOPHAGEAL REFLUX DISEASE   Paroxysmal atrial fibrillation   Last Assessment & Plan   06/19/2011 Office Visit Written 06/19/2011  4:25 PM by Kathlen Brunswick, MD     Ms. Ormiston continues to do extremely well with respect to PAF She continues on moderate dose flecainide with a low therapeutic level of 0.4 last year and no documented recurrent sustained arrhythmia.  As she ages, her risk of thromboembolism increases  from low to moderate, but in the absence of definite prolonged episodes of AF, I would not initiate full anticoagulation.  Her dose of aspirin will be reduced to 81 mg per day.        Imaging: Mm Digital Screening  06/14/2012  *RADIOLOGY REPORT*  Clinical Data: Screening.  DIGITAL BILATERAL SCREENING MAMMOGRAM WITH CAD  Comparison:  06/04/2008  FINDINGS:  ACR Breast Density Category 3: The breast tissue is heterogeneously dense.  There is no suspicious dominant mass, architectural distortion, or calcification to suggest malignancy.  Images were processed with CAD.  IMPRESSION: No mammographic evidence of malignancy.  A result letter of this screening mammogram will be mailed directly to the patient.  RECOMMENDATION: Screening mammogram in one year. (Code:SM-B-01Y)  BI-RADS CATEGORY 1:  Negative.   Original Report Authenticated By: Cain Saupe, M.D.      [facility] Calculation: Score not calculated. Missing: Total Cholesterol

## 2012-06-20 NOTE — Progress Notes (Signed)
Patient ID: [name], female   DOB: 1941/05/03, 72 y.o.   MRN: [id number]  HPI: Scheduled return visit for this very nice woman with a history of paroxysmal atrial fibrillation that appears to be well controlled on treatment with flecainide. She has remained asymptomatic since that medication was started.  She reports no new medical problems and negative colonoscopy 3 months ago.  [name] wonders whether full anticoagulation is warranted in her case.  Prior to Admission medications   Medication Sig Start Date End Date Taking? Authorizing Provider  aspirin 81 MG tablet Take 81 mg by mouth daily.    Yes Historical Provider, MD  atorvastatin (LIPITOR) 40 MG tablet Take 40 mg by mouth daily.     Yes Historical Provider, MD  Calcium Citrate (CITRACAL PO) Take by mouth daily.     Yes Historical Provider, MD  fish oil-omega-3 fatty acids 1000 MG capsule Take 2 g by mouth daily.    Yes Historical Provider, MD  flecainide (TAMBOCOR) 100 MG tablet TAKE ONE TABLET EVERY 12 HOURS 02/25/12  Yes Kathlen Brunswick, MD  Lactulose SOLN by Does not apply route. Takes 2 tablespoons   Yes Historical Provider, MD  Multiple Vitamins-Minerals (CENTRUM SILVER ADULT 50+ PO) Take by mouth.   Yes Historical Provider, MD  polyethylene glycol (MIRALAX / GLYCOLAX) packet Take 17 g by mouth daily.   Yes Historical Provider, MD  No Known Allergies    Past medical history, social history, and family history reviewed and updated.  ROS: Denies chest pain, dyspnea, palpitations, lightheadedness or syncope. All other systems reviewed and are negative.  PHYSICAL EXAM: BP 99/62  Pulse 68  Ht 5\' 2"  (1.575 m)  Wt 49.442 kg (109 lb)  BMI 19.93 kg/m2  General-Well developed; no acute distress Body habitus-proportionate weight and height Neck-No JVD; no carotid bruits Lungs-clear lung fields; resonant to percussion Cardiovascular-normal PMI; normal S1 and S2 Abdomen-normal bowel sounds; soft and non-tender without masses  or organomegaly Musculoskeletal-No deformities, no cyanosis or clubbing Neurologic-Normal cranial nerves; symmetric strength and tone Skin-Warm, no significant lesions Extremities-distal pulses intact; no edema  EKG: Normal sinus rhythm; right bundle branch block; intra-atrial conduction delay; otherwise normal.  Comparison with prior tracing performed 06/24/2009: RBBB and is now complete; delayed R-wave progression no longer present.  ASSESSMENT AND PLAN:   Bing, MD 06/20/2012 2:46 PM

## 2012-06-21 DIAGNOSIS — Z9289 Personal history of other medical treatment: Secondary | ICD-10-CM | POA: Insufficient documentation

## 2012-06-21 NOTE — Assessment & Plan Note (Addendum)
Most recent lipid profile from 04/2012 was fairly good (high HDL of 58 and acceptable LDL of 101), especially considering the absence of known vascular disease and treatment with moderate dose atorvastatin.  No change in therapy is necessary.

## 2012-06-21 NOTE — Assessment & Plan Note (Addendum)
The patient is free of symptoms that would suggest recurrent atrial fibrillation, but asymptomatic episodes are certainly possible.  She is at relatively low risk for thromboembolism, with only her gender and age contributing.  In the absence of a definite documented recurrence and in light of the fact that she appeared to be very reliable in the past at identifying her arrhythmia, I believe that the risk of full anticoagulation exceeds the benefit.  These considerations were discussed with the patient, who accepts my recommendation to continue aspirin as her only antithrombotic agent.

## 2012-06-23 ENCOUNTER — Other Ambulatory Visit: Payer: Self-pay | Admitting: Cardiology

## 2012-07-21 DIAGNOSIS — J019 Acute sinusitis, unspecified: Secondary | ICD-10-CM | POA: Diagnosis not present

## 2012-08-05 ENCOUNTER — Encounter: Payer: Self-pay | Admitting: *Deleted

## 2012-08-08 ENCOUNTER — Ambulatory Visit: Payer: Medicare Other | Admitting: Family Medicine

## 2012-11-29 ENCOUNTER — Other Ambulatory Visit (HOSPITAL_COMMUNITY): Payer: Self-pay | Admitting: Family Medicine

## 2012-12-21 ENCOUNTER — Other Ambulatory Visit: Payer: Self-pay

## 2012-12-21 ENCOUNTER — Telehealth: Payer: Self-pay | Admitting: Family Medicine

## 2012-12-21 DIAGNOSIS — Z859 Personal history of malignant neoplasm, unspecified: Secondary | ICD-10-CM

## 2012-12-21 DIAGNOSIS — R1909 Other intra-abdominal and pelvic swelling, mass and lump: Secondary | ICD-10-CM | POA: Diagnosis not present

## 2012-12-21 DIAGNOSIS — E785 Hyperlipidemia, unspecified: Secondary | ICD-10-CM

## 2012-12-21 DIAGNOSIS — R5381 Other malaise: Secondary | ICD-10-CM

## 2012-12-21 DIAGNOSIS — Z79899 Other long term (current) drug therapy: Secondary | ICD-10-CM

## 2012-12-21 DIAGNOSIS — Z0000 Encounter for general adult medical examination without abnormal findings: Secondary | ICD-10-CM

## 2012-12-21 DIAGNOSIS — Z85038 Personal history of other malignant neoplasm of large intestine: Secondary | ICD-10-CM

## 2012-12-21 NOTE — Telephone Encounter (Signed)
Would like to pick up paperwork for blood work.  Is scheduling a 6 month follow up from March, 2014.  Please call Patient. Thanks

## 2012-12-21 NOTE — Telephone Encounter (Signed)
Patient states she needs her CA125 checked also

## 2012-12-21 NOTE — Telephone Encounter (Signed)
Lipid, metabolic 7, CBC, CA 125

## 2012-12-21 NOTE — Telephone Encounter (Signed)
Bloodwork papers ready upfront. Left message on voicemail notifying patient.

## 2012-12-22 DIAGNOSIS — R1909 Other intra-abdominal and pelvic swelling, mass and lump: Secondary | ICD-10-CM | POA: Diagnosis not present

## 2012-12-22 DIAGNOSIS — R5383 Other fatigue: Secondary | ICD-10-CM | POA: Diagnosis not present

## 2012-12-22 DIAGNOSIS — E785 Hyperlipidemia, unspecified: Secondary | ICD-10-CM | POA: Diagnosis not present

## 2012-12-22 DIAGNOSIS — Z79899 Other long term (current) drug therapy: Secondary | ICD-10-CM | POA: Diagnosis not present

## 2012-12-22 LAB — CBC WITH DIFFERENTIAL/PLATELET
Basophils Relative: 0 % (ref 0–1)
Eosinophils Absolute: 0.1 10*3/uL (ref 0.0–0.7)
Lymphs Abs: 2.4 10*3/uL (ref 0.7–4.0)
MCH: 33.2 pg (ref 26.0–34.0)
Neutro Abs: 4 10*3/uL (ref 1.7–7.7)
Neutrophils Relative %: 56 % (ref 43–77)
Platelets: 316 10*3/uL (ref 150–400)
RBC: 4.01 MIL/uL (ref 3.87–5.11)

## 2012-12-22 LAB — BASIC METABOLIC PANEL
BUN: 21 mg/dL (ref 6–23)
Calcium: 9.6 mg/dL (ref 8.4–10.5)
Chloride: 105 mEq/L (ref 96–112)
Creat: 0.95 mg/dL (ref 0.50–1.10)

## 2012-12-22 LAB — LIPID PANEL
Cholesterol: 188 mg/dL (ref 0–200)
HDL: 60 mg/dL (ref >39)
Total CHOL/HDL Ratio: 3.1 Ratio
Triglycerides: 115 mg/dL (ref <150)
VLDL: 23 mg/dL (ref 0–40)

## 2012-12-22 LAB — CA 125: CA 125: 14.6 U/mL (ref 0.0–30.2)

## 2012-12-30 ENCOUNTER — Encounter: Payer: Self-pay | Admitting: Family Medicine

## 2012-12-30 ENCOUNTER — Ambulatory Visit (INDEPENDENT_AMBULATORY_CARE_PROVIDER_SITE_OTHER): Payer: Medicare Other | Admitting: Family Medicine

## 2012-12-30 VITALS — BP 114/72 | Ht 62.0 in | Wt 111.0 lb

## 2012-12-30 DIAGNOSIS — R7301 Impaired fasting glucose: Secondary | ICD-10-CM

## 2012-12-30 DIAGNOSIS — E785 Hyperlipidemia, unspecified: Secondary | ICD-10-CM | POA: Diagnosis not present

## 2012-12-30 DIAGNOSIS — R7309 Other abnormal glucose: Secondary | ICD-10-CM | POA: Diagnosis not present

## 2012-12-30 DIAGNOSIS — R7303 Prediabetes: Secondary | ICD-10-CM

## 2012-12-30 MED ORDER — PRAVASTATIN SODIUM 80 MG PO TABS: 80.0000 mg | ORAL_TABLET | Freq: Every evening | ORAL | Status: DC

## 2012-12-30 NOTE — Progress Notes (Signed)
  Subjective:    Patient ID: [name], female    DOB: Feb 10, 1941, 72 y.o.   MRN: [id number]  HPI Patient is here for an check up and has no concerns The patient presents somewhat confused she thinks she is here for a wellness exam but when discussed in detail what she is seeking is really a checkup regarding her cholesterol. She does not want wellness exam she states she gets after her gynecologist.  She denies any chest pressure shortness of breath denies rectal bleeding vomiting diarrhea chest pain.  She does try to eat healthy.  Family history and personal history were all reviewed. Patient is keeping up on colonoscopy it was negative back in November 2013 should it is due again in 5 years.  Review of Systems See above    Objective:   Physical Exam Neck no masses lungs are clear no crackles heart is regular abdomen soft no masses extremities no edema skin warm dry       Assessment & Plan:  #1 prediabetes-low starch diet recheck hemoglobin A1c in 6 months. I told the patient that it is possible that H. atorvastatin could play a role in this therefore we will switch cholesterol medicines  #2 hyperlipidemia stopped atorvastatin use pravastatin 80 mg daily. Recheck lipid liver profile in approximate 6 months

## 2013-02-21 ENCOUNTER — Ambulatory Visit (INDEPENDENT_AMBULATORY_CARE_PROVIDER_SITE_OTHER): Payer: Medicare Other

## 2013-02-21 DIAGNOSIS — Z23 Encounter for immunization: Secondary | ICD-10-CM

## 2013-03-21 ENCOUNTER — Encounter: Payer: Self-pay | Admitting: Adult Health

## 2013-03-21 ENCOUNTER — Ambulatory Visit (INDEPENDENT_AMBULATORY_CARE_PROVIDER_SITE_OTHER): Payer: Medicare Other | Admitting: Adult Health

## 2013-03-21 VITALS — BP 99/59 | HR 68 | Ht 62.0 in | Wt 107.0 lb

## 2013-03-21 DIAGNOSIS — K219 Gastro-esophageal reflux disease without esophagitis: Secondary | ICD-10-CM | POA: Diagnosis not present

## 2013-03-21 DIAGNOSIS — R079 Chest pain, unspecified: Secondary | ICD-10-CM | POA: Diagnosis not present

## 2013-03-21 NOTE — Progress Notes (Deleted)
Name: [name]    DOB: Sep 28, 1940  Age: 72 y.o.  MR#: [id number]       PCP:  Lilyan Punt, MD      Insurance: Payor: MEDICARE / Plan: MEDICARE PART A AND B / Product Type: *No Product type* /   CC:    Chief Complaint  Patient presents with  . Hypertension  . Atrial Fibrillation    Paroxysmal    VS Filed Vitals:   03/21/13 1449  BP: 99/59  Pulse: 68  Height: 5\' 2"  (1.575 m)  Weight: 107 lb (48.535 kg)    Weights Current Weight  03/21/13 107 lb (48.535 kg)  12/30/12 111 lb (50.349 kg)  06/20/12 109 lb (49.442 kg)    Blood Pressure  BP Readings from Last 3 Encounters:  03/21/13 99/59  12/30/12 114/72  06/20/12 99/62     Admit date:  (Not on file) Last encounter with RMR:  Visit date not found   Allergy Review of patient's allergies indicates no known allergies.  Current Outpatient Prescriptions  Medication Sig Dispense Refill  . aspirin 81 MG tablet Take 81 mg by mouth daily.       . Calcium Citrate (CITRACAL PO) Take by mouth daily.        . fish oil-omega-3 fatty acids 1000 MG capsule Take 2 g by mouth daily.       . flecainide (TAMBOCOR) 100 MG tablet TAKE ONE TABLET EVERY 12 HOURS  60 tablet  11  . Lactulose SOLN by Does not apply route. Takes 2 tablespoons      . Multiple Vitamins-Minerals (CENTRUM SILVER ADULT 50+ PO) Take by mouth.      . polyethylene glycol (MIRALAX / GLYCOLAX) packet Take 17 g by mouth daily.      . pravastatin (PRAVACHOL) 80 MG tablet Take 1 tablet (80 mg total) by mouth every evening.  30 tablet  11   No current facility-administered medications for this visit.    Discontinued Meds:   There are no discontinued medications.  Patient Active Problem List   Diagnosis Date Noted  . Prediabetes 12/30/2012  . History of diagnostic tests 06/21/2012  . Paroxysmal atrial fibrillation   . ADENOCARCINOMA, COLON 06/24/2009  . GASTROESOPHAGEAL REFLUX DISEASE 06/24/2009  . HYPERLIPIDEMIA 06/12/2009    LABS    Component Value Date/Time    NA 141 12/22/2012 0945   NA 142 06/19/2011 1605   NA 137 04/15/2010   K 4.4 12/22/2012 0945   K 4.2 06/19/2011 1605   K 4.7 04/15/2010   CL 105 12/22/2012 0945   CL 104 06/19/2011 1605   CL 101 04/15/2010   CO2 30 12/22/2012 0945   CO2 28 06/19/2011 1605   CO2 24 04/15/2010   GLUCOSE 114* 12/22/2012 0945   GLUCOSE 81 06/19/2011 1605   GLUCOSE 91 04/15/2010   BUN 21 12/22/2012 0945   BUN 24* 06/19/2011 1605   BUN 23 04/15/2010   CREATININE 0.95 12/22/2012 0945   CREATININE 0.74 06/19/2011 1605   CREATININE 0.79 04/15/2010   CREATININE 0.79 05/09/2009 1638   CREATININE 0.74 03/14/2009   CALCIUM 9.6 12/22/2012 0945   CALCIUM 9.3 06/19/2011 1605   CALCIUM 9.6 04/15/2010   GFRNONAA >60 11/11/2007 0555   GFRNONAA >60 11/10/2007 0505   GFRNONAA >60 11/08/2007 2137   GFRAA  Value: >60        The eGFR has been calculated using the MDRD equation. This calculation has not been validated in all clinical 11/11/2007 0555  GFRAA  Value: >60        The eGFR has been calculated using the MDRD equation. This calculation has not been validated in all clinical 11/10/2007 0505   GFRAA  Value: >60        The eGFR has been calculated using the MDRD equation. This calculation has not been validated in all clinical 11/08/2007 2137   CMP     Component Value Date/Time   NA 141 12/22/2012 0945   K 4.4 12/22/2012 0945   CL 105 12/22/2012 0945   CO2 30 12/22/2012 0945   GLUCOSE 114* 12/22/2012 0945   BUN 21 12/22/2012 0945   CREATININE 0.95 12/22/2012 0945   CREATININE 0.79 04/15/2010   CALCIUM 9.6 12/22/2012 0945   PROT 6.4 06/19/2011 1605   ALBUMIN 4.0 06/19/2011 1605   AST 19 06/19/2011 1605   ALT 14 06/19/2011 1605   ALKPHOS 74 06/19/2011 1605   BILITOT 0.3 06/19/2011 1605   GFRNONAA >60 11/11/2007 0555   GFRAA  Value: >60        The eGFR has been calculated using the MDRD equation. This calculation has not been validated in all clinical 11/11/2007 0555       Component Value Date/Time   WBC 7.1 12/22/2012 0945   WBC 6.5 11/11/2007 0555   WBC 8.5  11/10/2007 0505   HGB 13.3 12/22/2012 0945   HGB 11.2* 11/11/2007 0555   HGB 11.6* 11/10/2007 0505   HCT 39.1 12/22/2012 0945   HCT 33.2* 11/11/2007 0555   HCT 34.2* 11/10/2007 0505   MCV 97.5 12/22/2012 0945   MCV 100.3* 11/11/2007 0555   MCV 101.0* 11/10/2007 0505    Lipid Panel     Component Value Date/Time   CHOL 188 12/22/2012 0945   TRIG 115 12/22/2012 0945   HDL 60 12/22/2012 0945   CHOLHDL 3.1 12/22/2012 0945   VLDL 23 12/22/2012 0945   LDLCALC 105* 12/22/2012 0945    ABG No results found for this basename: phart, pco2, pco2art, po2, po2art, hco3, tco2, acidbasedef, o2sat     Lab Results  Component Value Date   TSH 1.430 03/14/2009   BNP (last 3 results) No results found for this basename: PROBNP,  in the last 8760 hours Cardiac Panel (last 3 results) No results found for this basename: CKTOTAL, CKMB, TROPONINI, RELINDX,  in the last 72 hours  Iron/TIBC/Ferritin No results found for this basename: iron, tibc, ferritin     EKG Orders placed in visit on 03/21/13  . EKG 12-LEAD     Prior Assessment and Plan Problem List as of 03/21/2013   ADENOCARCINOMA, COLON   HYPERLIPIDEMIA   Last Assessment & Plan   06/20/2012 Office Visit Edited 06/21/2012  8:24 PM by Kathlen Brunswick, MD     Most recent lipid profile from 04/2012 was fairly good (high HDL of 58 and acceptable LDL of 101), especially considering the absence of known vascular disease and treatment with moderate dose atorvastatin.  No change in therapy is necessary.    GASTROESOPHAGEAL REFLUX DISEASE   Paroxysmal atrial fibrillation   Last Assessment & Plan   06/20/2012 Office Visit Edited 06/21/2012  8:22 PM by Kathlen Brunswick, MD     The patient is free of symptoms that would suggest recurrent atrial fibrillation, but asymptomatic episodes are certainly possible.  She is at relatively low risk for thromboembolism, with only her gender and age contributing.  In the absence of a definite documented recurrence and in light of the  fact that she appeared to be very reliable in the past at identifying her arrhythmia, I believe that the risk of full anticoagulation exceeds the benefit.  These considerations were discussed with the patient, who accepts my recommendation to continue aspirin as her only antithrombotic agent.    History of diagnostic tests   Prediabetes       Imaging: No results found.

## 2013-03-21 NOTE — Assessment & Plan Note (Signed)
She remains in NSR. RBBB. QT interval is 414 ms; QTc 421. Will get a flecainide level.

## 2013-03-21 NOTE — Patient Instructions (Addendum)
Your physician wants you to follow-up in:  One Month with [name] NP  Your physician has requested that you have en exercise stress myoview. For further information please visit [email or web address] Please follow instruction sheet, as given.     We will discuss you results of test at your one month visit

## 2013-03-21 NOTE — Assessment & Plan Note (Signed)
This appears atypical for cardiac chest pain as it is on the right and not associated with shortness of breath or diaphoresis . She works out at [employer] by walking on the treadmill and using machines. She has no chest pain with this. I think this is more musculoskeletal, However, will check a cardiolite stress test for evaluation of cardiac etiology of chest discomfort.  CMET will be ordered, and LFTs.

## 2013-03-21 NOTE — Assessment & Plan Note (Signed)
She is taking Zegrid and Tums. I have suggested Mylanta for gas relief as well to use prn. She should follow up with GI for ongoing assessment of frequent burping

## 2013-03-21 NOTE — Progress Notes (Signed)
HPI: [name] is a 72 y/o former patient of Dr. Dietrich Pates who will now be established with Dr. Wyline Mood we are following for ongoing assessment and management for Atrial fibrillation on flecainide. She comes today with complaints of right sided chest pain while at rest, usually waking her up. Pain is pressure under her right breast, radiating through to back, and on occasion to the right neck. She goes to the gym and walks on the treadmill, along with using the machines without occurrence of chest pain. She is also complaining of recurring burping and belching, but this has been chronic for her for over 3 years She sees Dr. Higinio Roger. She has had a history of colon cancer. Per recent echo, she is clear of it.   No Known Allergies  Current Outpatient Prescriptions  Medication Sig Dispense Refill  . aspirin 81 MG tablet Take 81 mg by mouth daily.       . Calcium Citrate (CITRACAL PO) Take by mouth daily.        . fish oil-omega-3 fatty acids 1000 MG capsule Take 2 g by mouth daily.       . flecainide (TAMBOCOR) 100 MG tablet TAKE ONE TABLET EVERY 12 HOURS  60 tablet  11  . Lactulose SOLN by Does not apply route. Takes 2 tablespoons      . Multiple Vitamins-Minerals (CENTRUM SILVER ADULT 50+ PO) Take by mouth.      . polyethylene glycol (MIRALAX / GLYCOLAX) packet Take 17 g by mouth daily.      . pravastatin (PRAVACHOL) 80 MG tablet Take 1 tablet (80 mg total) by mouth every evening.  30 tablet  11   No current facility-administered medications for this visit.    Past Medical History  Diagnosis Date  . Paroxysmal atrial fibrillation     Palpitations; maintained on flecainide; -normal coronary angiography and 1999; negative stress nuclear study in 11/2005  . Gastroesophageal reflux disease   . Hearing loss     mild  . Tobacco abuse, in remission     10-pack-years; quit in 2001  . 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 Hyperlipidemia   . Ovarian cyst 2008  .  PONV (postoperative nausea and vomiting)   . Arthritis   . Irregular heartbeat   . Gastroparesis     Past Surgical History  Procedure Laterality Date  . Partial colectomy  2006    adenocarcinoma  . Abdominal hysterectomy  1983    partial  . Cosmetic surgery  09/2007  . Colonoscopy  03/25/2012    Malissa Hippo, MD  . Tonsillectomy    . Cholecystectomy      ROS: Review of systems complete and found to be negative unless listed above  PHYSICAL EXAM BP 99/59  Pulse 68  Ht 5\' 2"  (1.575 m)  Wt 107 lb (48.535 kg)  BMI 19.57 kg/m2  General: Well developed, well nourished, in no acute distress Head: Eyes PERRLA, No xanthomas.   Normal cephalic and atramatic  Lungs: Clear bilaterally to auscultation and percussion. Heart: HRRR S1 S2,bradycardic without MRG.  Pulses are 2+ & equal.            No carotid bruit. No JVD.  No abdominal bruits. No femoral bruits. Abdomen: Bowel sounds are positive, abdomen soft and non-tender without masses or                  Hernia's noted. Msk:  Back normal, normal  gait. Pain is not reproducible with palpation,Normal strength and tone for age. Extremities: No clubbing, cyanosis or edema.  DP +1 Neuro: Alert and oriented X 3. Psych:  Good affect, responds appropriately  EKG: SR with RBBB. Rate of 68 bpm.  ASSESSMENT AND PLAN

## 2013-03-22 ENCOUNTER — Encounter: Payer: Self-pay | Admitting: *Deleted

## 2013-03-22 LAB — COMPREHENSIVE METABOLIC PANEL
AST: 17 U/L (ref 0–37)
Alkaline Phosphatase: 69 U/L (ref 39–117)
BUN: 19 mg/dL (ref 6–23)
Creat: 0.73 mg/dL (ref 0.50–1.10)
Potassium: 4.2 mEq/L (ref 3.5–5.3)

## 2013-03-24 ENCOUNTER — Telehealth: Payer: Self-pay | Admitting: Adult Health

## 2013-03-24 NOTE — Telephone Encounter (Signed)
Results of labwork / tgs  °

## 2013-03-24 NOTE — Telephone Encounter (Signed)
Advised pt that as soon as the Flecainide level came back the office will call her and let her know.

## 2013-03-29 ENCOUNTER — Encounter (HOSPITAL_COMMUNITY)
Admission: RE | Admit: 2013-03-29 | Discharge: 2013-03-29 | Disposition: A | Discharge status: Home / Self Care | Payer: Medicare Other | Source: Ambulatory Visit | Attending: Cardiology | Admitting: Cardiology

## 2013-03-29 ENCOUNTER — Encounter (HOSPITAL_COMMUNITY)
Admission: RE | Admit: 2013-03-29 | Discharge: 2013-03-29 | Disposition: A | Discharge status: Home / Self Care | Payer: Medicare Other | Source: Ambulatory Visit | Attending: Adult Health | Admitting: Adult Health

## 2013-03-29 ENCOUNTER — Encounter (HOSPITAL_COMMUNITY): Payer: Self-pay

## 2013-03-29 DIAGNOSIS — R079 Chest pain, unspecified: Secondary | ICD-10-CM

## 2013-03-29 MED ORDER — SODIUM CHLORIDE 0.9 % IJ SOLN
INTRAMUSCULAR | Status: AC
  Administered 2013-03-29: 10 mL via INTRAVENOUS
  Filled 2013-03-29: qty 10

## 2013-03-29 MED ORDER — TECHNETIUM TC 99M SESTAMIBI - CARDIOLITE
30.0000 | Freq: Once | INTRAVENOUS | Status: AC | PRN
  Administered 2013-03-29: 30 via INTRAVENOUS

## 2013-03-29 MED ORDER — TECHNETIUM TC 99M SESTAMIBI GENERIC - CARDIOLITE
10.0000 | Freq: Once | INTRAVENOUS | Status: AC | PRN
  Administered 2013-03-29: 10 via INTRAVENOUS

## 2013-03-29 MED ORDER — REGADENOSON 0.4 MG/5ML IV SOLN
INTRAVENOUS | Status: AC
  Filled 2013-03-29: qty 5

## 2013-03-29 NOTE — Progress Notes (Signed)
Stress Lab Nurses Notes - [name]  [name] 03/29/2013 Reason for doing test: Chest Pain Type of test: Stress Cardiolite Nurse performing test: Parke Poisson, RN Nuclear Medicine Tech: Lou Cal Echo Tech: Not Applicable MD performing test: Ival Bible / M.Geni Bers PA Family MD: Lilyan Punt Test explained and consent signed: yes IV started: 22g jelco, Saline lock flushed, No redness or edema and Saline lock started in radiology Symptoms: SOB & chest feeling heavy Treatment/Intervention: None Reason test stopped: reached target HR After recovery IV was: Discontinued via X-ray tech and No redness or edema Patient to return to Nuc. Med at : 11:00 Patient discharged: Home Patient's Condition upon discharge was: stable Comments: During test peak BP 151/65 & HR 134.  Recovery BP 142/82 & HR 82.  Symptoms resolved in recovery. [name]

## 2013-04-07 ENCOUNTER — Telehealth: Payer: Self-pay | Admitting: *Deleted

## 2013-04-07 MED ORDER — TRIAMCINOLONE ACETONIDE 0.1 % EX CREA: 1.0000 "application " | TOPICAL_CREAM | Freq: Two times a day (BID) | CUTANEOUS | Status: DC

## 2013-04-07 NOTE — Addendum Note (Signed)
Addended by: Margaretha Sheffield on: 04/07/2013 01:16 PM   Modules accepted: Orders

## 2013-04-07 NOTE — Telephone Encounter (Signed)
Ok plus two ref 

## 2013-04-07 NOTE — Telephone Encounter (Signed)
Rash on her side has returned and needs refill Kenalog to [facility]

## 2013-04-07 NOTE — Telephone Encounter (Signed)
Rx sent electronically to [facility]. Patient notified. 

## 2013-04-18 ENCOUNTER — Ambulatory Visit (INDEPENDENT_AMBULATORY_CARE_PROVIDER_SITE_OTHER): Payer: Medicare Other | Admitting: Family Medicine

## 2013-04-18 ENCOUNTER — Encounter: Payer: Self-pay | Admitting: Family Medicine

## 2013-04-18 VITALS — BP 122/74 | Ht 62.0 in | Wt 110.0 lb

## 2013-04-18 DIAGNOSIS — M549 Dorsalgia, unspecified: Secondary | ICD-10-CM | POA: Diagnosis not present

## 2013-04-18 MED ORDER — PREDNISONE 10 MG PO TABS: ORAL_TABLET | ORAL | Status: DC

## 2013-04-18 MED ORDER — OXYCODONE-ACETAMINOPHEN 5-325 MG PO TABS: 1.0000 | ORAL_TABLET | ORAL | Status: DC | PRN

## 2013-04-18 NOTE — Patient Instructions (Signed)
Watch for any signs of a blistering rash, if so call,  Recheck next Thursday

## 2013-04-18 NOTE — Progress Notes (Signed)
   Subjective:    Patient ID: [name], female    DOB: 11/04/1940, 72 y.o.   MRN: [id number]  HPIHaving low back pain radiates down left leg. Pain started 3 days ago. Tried motrin and a heating pad. Sitting makes the pain worse.  Started Sun night went into the right leg Slept 2 hours sun night Fair rest last night Standing helps Sitting and laying worse This patient's had three-day history of discomfort that radiates from the left back around the hip across the front of the leg down to the knee she describes it as unrelenting pain severe. No blistering rash. No known injury. Better with standing and moving around worse with sitting denies any previous troubles. No sweats chills or fever. PMH benign  Review of Systems See above no loss of bowel or bladder control    Objective:   Physical Exam Lungs clear hearts regular t low back mild tenderness in the left lower back negative straight leg raise but subjective discomfort down the left leg to the knee.       Assessment & Plan:  Left leg pain left back pain consistent with possibility of an impinged nerve retina zone taper along with Percocet for pain cautioned drowsiness patient was also warned to watch for any signs of blistering rash which could be a sign of shingles call us back if problems followup 7 to 8 days

## 2013-04-19 ENCOUNTER — Encounter: Payer: Self-pay | Admitting: Family Medicine

## 2013-04-19 ENCOUNTER — Ambulatory Visit (INDEPENDENT_AMBULATORY_CARE_PROVIDER_SITE_OTHER): Payer: Medicare Other | Admitting: Family Medicine

## 2013-04-19 VITALS — Ht 62.0 in | Wt 110.0 lb

## 2013-04-19 DIAGNOSIS — R112 Nausea with vomiting, unspecified: Secondary | ICD-10-CM

## 2013-04-19 NOTE — Progress Notes (Signed)
   Subjective:    Patient ID: [name], female    DOB: Jan 24, 1941, 72 y.o.   MRN: [id number]  HPIPatient vomiting today and has nausea since starting the pain mediciation prescribed yesterday.  Please see yesterday's note. Patient relates no other change other nausea after taking pain medicine she does not want any pain medicine at this time.   Review of Systems     Objective:   Physical Exam Patient stable no other changes assessment for nausea throwing up after taking Percocet       Assessment & Plan:  Low back pain continue prednisone Pain medication I believe is causing nausea she states she's gotten try taking a half a tablet and see if she tolerates it better I told her if it continues to cause her trouble notify us we will try different pain medicine

## 2013-04-20 ENCOUNTER — Ambulatory Visit: Payer: Medicare Other | Admitting: Physician Assistant

## 2013-04-20 ENCOUNTER — Ambulatory Visit: Payer: Medicare Other | Admitting: Adult Health

## 2013-04-20 ENCOUNTER — Encounter: Payer: Self-pay | Admitting: Physician Assistant

## 2013-04-20 ENCOUNTER — Ambulatory Visit (INDEPENDENT_AMBULATORY_CARE_PROVIDER_SITE_OTHER): Payer: Medicare Other | Admitting: Physician Assistant

## 2013-04-20 VITALS — BP 133/54 | HR 64 | Ht 62.0 in | Wt 109.0 lb

## 2013-04-20 DIAGNOSIS — R079 Chest pain, unspecified: Secondary | ICD-10-CM

## 2013-04-20 DIAGNOSIS — R141 Gas pain: Secondary | ICD-10-CM

## 2013-04-20 DIAGNOSIS — I4891 Unspecified atrial fibrillation: Secondary | ICD-10-CM

## 2013-04-20 DIAGNOSIS — R142 Eructation: Secondary | ICD-10-CM

## 2013-04-20 DIAGNOSIS — I480 Paroxysmal atrial fibrillation: Secondary | ICD-10-CM

## 2013-04-20 NOTE — Patient Instructions (Addendum)
Your physician recommends that you schedule a follow-up appointment in: 2 months with Dr Wyline Mood  Your physician recommends that you continue on your current medications as directed. Please refer to the Current Medication list given to you today.  You have been referred to Dr Karilyn Cota.

## 2013-04-20 NOTE — Progress Notes (Signed)
HPI:  This is a very pleasant 72 year old female patient who will be followed by Dr. Wyline Mood. She has a history of atrial fibrillation treated with flecainide. She had a stress Myoview recently because of some right-sided chest pain. This was felt to be a low risk exercise Cardiolite with equivocal ST segment abnormalities, mild chest pain at peak exertion with no definite arrhythmias. Perfusion imaging was consistent with breast attenuation affecting the mid to apical anterior septal wall. No definite ischemic defects. Ejection fraction was 71% with normal volumes and no wall motion abnormalities. I was actually present for her stress test. At peak exercise her heart rate did look like it went into atrial fibrillation but this was not captured. It was only a brief run and she was quickly in normal sinus rhythm. She says she never pushes herself to that extent and has no significant palpitations on the flecainide. She denies any further chest pain. She does complain of excessive belching. She admits to drinking some carbonated drinks. She sees Dr. Karilyn Cota but has not made an appointment for followup.  No Known Allergies  Current Outpatient Prescriptions on File Prior to Visit: aspirin 81 MG tablet, Take 81 mg by mouth daily. , Disp: , Rfl:  Calcium Citrate (CITRACAL PO), Take by mouth daily.  , Disp: , Rfl:  flecainide (TAMBOCOR) 100 MG tablet, TAKE ONE TABLET EVERY 12 HOURS, Disp: 60 tablet, Rfl: 11 Lactulose SOLN, by Does not apply route. Takes 2 tablespoons, Disp: , Rfl:  Multiple Vitamins-Minerals (CENTRUM SILVER ADULT 50+ PO), Take by mouth., Disp: , Rfl:  oxyCODONE-acetaminophen (ROXICET) 5-325 MG per tablet, Take 1 tablet by mouth every 4 (four) hours as needed for severe pain., Disp: 35 tablet, Rfl: 0 polyethylene glycol (MIRALAX / GLYCOLAX) packet, Take 17 g by mouth daily., Disp: , Rfl:  pravastatin (PRAVACHOL) 80 MG tablet, Take 1 tablet (80 mg total) by mouth every evening., Disp: 30 tablet,  Rfl: 11 predniSONE (DELTASONE) 10 MG tablet, 3qd for 3d then 2qd for 3d then 1qd for 3d, Disp: 18 tablet, Rfl: 0 triamcinolone cream (KENALOG) 0.1 %, Apply 1 application topically 2 (two) times daily., Disp: 30 g, Rfl: 2  No current facility-administered medications on file prior to visit.   Past Medical History:   Paroxysmal atrial fibrillation                                 Comment:Palpitations; maintained on flecainide; -normal              coronary angiography and 1999; negative stress               nuclear study in 11/2005   Gastroesophageal reflux disease                              Hearing loss                                                   Comment:mild   Tobacco abuse, in remission                                    Comment:10-pack-years; quit in 2001   Adenocarcinoma,  colon                           03/2005        Comment:Stage I; resected in 03/2005   Hyperlipidemia                                               Ovarian cyst                                    2008         PONV (postoperative nausea and vomiting)                     Arthritis                                                    Irregular heartbeat                                          Gastroparesis                                               Past Surgical History:   PARTIAL COLECTOMY                                2006           Comment:adenocarcinoma   ABDOMINAL HYSTERECTOMY                           1983           Comment:partial   COSMETIC SURGERY                                 09/2007       COLONOSCOPY                                      03/25/2012     Comment:Najeeb U Rehman, MD   TONSILLECTOMY                                                 CHOLECYSTECTOMY                                              Review of patient's family history indicates:   Heart disease  Mother                   Stroke                         Mother                   Heart attack                    Mother                   Cancer                         Father                     Comment: lung   Heart disease                  Father                   Hypertension                   Father                   Cancer                         Brother                    Comment: rectal   Peripheral vascular disease    Brother                  Social History   Marital Status: Married             Spouse Name:                      Years of Education:                 Number of children:             Occupational History Occupation          Event organiser                                Social History Main Topics   Smoking Status: Former Smoker                   Packs/Day: 0.30  Years: 40        Quit date: 03/25/2002   Smokeless Status: Never Used                       Comment: Quit smoking in 2001   Alcohol Use: No             Drug Use: No             Sexual Activity: Yes                    Birth Control/Protection: Surgical  Other Topics            Concern   None on  file  Social History Narrative   None on file    ROS: GE reflux, see history of present illness, otherwise negative   PHYSICAL EXAM: Well-nournished, in no acute distress. Neck: No JVD, HJR, Bruit, or thyroid enlargement  Lungs: No tachypnea, clear without wheezing, rales, or rhonchi  Cardiovascular: RRR, PMI not displaced, heart sounds normal, no murmurs, gallops, bruit, thrill, or heave.  Abdomen: BS normal. Soft without organomegaly, masses, lesions or tenderness.  Extremities: without cyanosis, clubbing or edema. Good distal pulses bilateral  SKin: Warm, no lesions or rashes   Musculoskeletal: No deformities  Neuro: no focal signs  BP 133/54  Pulse 64  Ht 5\' 2"  (1.575 m)  Wt 109 lb (49.442 kg)  BMI 19.93 kg/m2     IMPRESSION: Overall low risk exercise Cardiolite. Equivocal ST segment abnormalities were noted, mild chest pain at peak exertion, otherwise  no definite arrhythmias. Perfusion imaging is consistent with breast attenuation affecting the mid to apical anteroseptal wall, no definite ischemic defects. LVEF is 71% with normal volumes and no wall motion abnormalities.   Electronically Signed   By: Nona Dell M.D.   On: 03/29/2013 15:20

## 2013-04-20 NOTE — Assessment & Plan Note (Signed)
Maintaining normal sinus rhythm on flecainide

## 2013-04-20 NOTE — Assessment & Plan Note (Addendum)
No further chest pain. Exercise Cardiolite was low risk for ischemia. Recommend following up with Dr.Rehman for possible GI causes. She does have excessive belching. Also told her to avoid carbonated drinks.

## 2013-04-22 ENCOUNTER — Encounter (HOSPITAL_COMMUNITY): Payer: Self-pay | Admitting: Emergency Medicine

## 2013-04-22 ENCOUNTER — Emergency Department (HOSPITAL_COMMUNITY)
Admission: EM | Admit: 2013-04-22 | Discharge: 2013-04-22 | Disposition: A | Discharge status: Home / Self Care | Payer: Medicare Other | Attending: Emergency Medicine | Admitting: Emergency Medicine

## 2013-04-22 DIAGNOSIS — M129 Arthropathy, unspecified: Secondary | ICD-10-CM | POA: Diagnosis not present

## 2013-04-22 DIAGNOSIS — M5416 Radiculopathy, lumbar region: Secondary | ICD-10-CM

## 2013-04-22 DIAGNOSIS — Z7982 Long term (current) use of aspirin: Secondary | ICD-10-CM | POA: Insufficient documentation

## 2013-04-22 DIAGNOSIS — Z8742 Personal history of other diseases of the female genital tract: Secondary | ICD-10-CM | POA: Insufficient documentation

## 2013-04-22 DIAGNOSIS — H9190 Unspecified hearing loss, unspecified ear: Secondary | ICD-10-CM | POA: Insufficient documentation

## 2013-04-22 DIAGNOSIS — Z90710 Acquired absence of both cervix and uterus: Secondary | ICD-10-CM | POA: Insufficient documentation

## 2013-04-22 DIAGNOSIS — Z9089 Acquired absence of other organs: Secondary | ICD-10-CM | POA: Insufficient documentation

## 2013-04-22 DIAGNOSIS — M79609 Pain in unspecified limb: Secondary | ICD-10-CM | POA: Diagnosis not present

## 2013-04-22 DIAGNOSIS — Z8719 Personal history of other diseases of the digestive system: Secondary | ICD-10-CM | POA: Diagnosis not present

## 2013-04-22 DIAGNOSIS — Z79899 Other long term (current) drug therapy: Secondary | ICD-10-CM | POA: Insufficient documentation

## 2013-04-22 DIAGNOSIS — IMO0002 Reserved for concepts with insufficient information to code with codable children: Secondary | ICD-10-CM | POA: Insufficient documentation

## 2013-04-22 DIAGNOSIS — Z87891 Personal history of nicotine dependence: Secondary | ICD-10-CM | POA: Insufficient documentation

## 2013-04-22 DIAGNOSIS — R110 Nausea: Secondary | ICD-10-CM | POA: Insufficient documentation

## 2013-04-22 DIAGNOSIS — E785 Hyperlipidemia, unspecified: Secondary | ICD-10-CM | POA: Diagnosis not present

## 2013-04-22 DIAGNOSIS — M549 Dorsalgia, unspecified: Secondary | ICD-10-CM | POA: Insufficient documentation

## 2013-04-22 DIAGNOSIS — I4891 Unspecified atrial fibrillation: Secondary | ICD-10-CM | POA: Diagnosis not present

## 2013-04-22 DIAGNOSIS — M25559 Pain in unspecified hip: Secondary | ICD-10-CM | POA: Diagnosis not present

## 2013-04-22 DIAGNOSIS — Z85038 Personal history of other malignant neoplasm of large intestine: Secondary | ICD-10-CM | POA: Diagnosis not present

## 2013-04-22 MED ORDER — DIAZEPAM 5 MG PO TABS: 2.5000 mg | ORAL_TABLET | Freq: Three times a day (TID) | ORAL | Status: DC | PRN

## 2013-04-22 MED ORDER — KETOROLAC TROMETHAMINE 30 MG/ML IJ SOLN
30.0000 mg | Freq: Once | INTRAMUSCULAR | Status: AC
  Administered 2013-04-22: 30 mg via INTRAMUSCULAR
  Filled 2013-04-22: qty 1

## 2013-04-22 MED ORDER — HYDROCODONE-ACETAMINOPHEN 5-325 MG PO TABS: 1.0000 | ORAL_TABLET | ORAL | Status: DC | PRN

## 2013-04-22 MED ORDER — ONDANSETRON 4 MG PO TBDP
4.0000 mg | ORAL_TABLET | Freq: Once | ORAL | Status: AC
  Administered 2013-04-22: 4 mg via ORAL
  Filled 2013-04-22: qty 1

## 2013-04-22 MED ORDER — DIAZEPAM 5 MG PO TABS
2.5000 mg | ORAL_TABLET | Freq: Once | ORAL | Status: AC
  Administered 2013-04-22: 2.5 mg via ORAL
  Filled 2013-04-22: qty 1

## 2013-04-22 MED ORDER — IBUPROFEN 400 MG PO TABS: 400.0000 mg | ORAL_TABLET | Freq: Four times a day (QID) | ORAL | Status: DC | PRN

## 2013-04-22 MED ORDER — HYDROMORPHONE HCL PF 1 MG/ML IJ SOLN
0.5000 mg | Freq: Once | INTRAMUSCULAR | Status: AC
  Administered 2013-04-22: 0.5 mg via INTRAMUSCULAR
  Filled 2013-04-22: qty 1

## 2013-04-22 NOTE — ED Provider Notes (Signed)
CSN: [id number]     Arrival date & time 04/22/13  8413 History  This chart was scribed for Raeford Razor, MD by Bennett Scrape, ED Scribe, and Luisa Dago, ED Scribe. This patient was seen in room APA09/APA09 and the patient's care was started at 7:19 AM.   Chief Complaint  Patient presents with  . Hip Pain  . Leg Pain  . Back Pain    The history is provided by the patient. No language interpreter was used.    HPI Comments: [name] is a 72 y.o. female who presents to the Emergency Department complaining of constant posterior left hip pain that starts in the lower back and radiates around to the anterior leg down to the left shin for the past 6 days. She describes the pain as throbbing with localized stabbing pain in her knee. She denies any falls or trauma stating that she was getting into bed when pain started. She states that she is here today because the pain was originally only radiating to the left knee but started going further down the leg yesterday. She denies any posterior leg pain. She reports that laying and sitting worsen the pain and that a heating pad and massage improve her symptoms. She was seen by PMD 4 days ago for the same and told that she had a slipped disc. She was prescribed oxycodone and prednisone with no improvement. She reports that she hasn't been taking the oxycodone as much due to the side effect of nausea. She denies any prior episodes of the same or h/o prior back surgeries. She denies any extremity swelling, abdominal pain, rashes, bowel or bladder incontinence. She denies being on any anticoagulants but states that she takes ASA daily.   Past Medical History  Diagnosis Date  . Paroxysmal atrial fibrillation     Palpitations; maintained on flecainide; -normal coronary angiography and 1999; negative stress nuclear study in 11/2005  . Gastroesophageal reflux disease   . Hearing loss     mild  . Tobacco abuse, in remission     10-pack-years; quit in  2001  . 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 Hyperlipidemia   . Ovarian cyst 2008  . PONV (postoperative nausea and vomiting)   . Arthritis   . Irregular heartbeat   . Gastroparesis    Past Surgical History  Procedure Laterality Date  . Partial colectomy  2006    adenocarcinoma  . Abdominal hysterectomy  1983    partial  . Cosmetic surgery  09/2007  . Colonoscopy  03/25/2012    Malissa Hippo, MD  . Tonsillectomy    . Cholecystectomy     Family History  Problem Relation Age of Onset  . Heart disease Mother   . Stroke Mother   . Heart attack Mother   . Cancer Father     lung  . Heart disease Father   . Hypertension Father   . Cancer Brother     rectal  . Peripheral vascular disease Brother    History  Substance Use Topics  . Smoking status: Former Smoker -- 0.30 packs/day for 40 years    Quit date: 03/25/2002  . Smokeless tobacco: Never Used     Comment: Quit smoking in 2001  . Alcohol Use: No   No OB history provided.  Review of Systems  Cardiovascular: Negative for leg swelling.  Gastrointestinal: Positive for nausea. Negative for abdominal pain.       - bowel  incontinence   Genitourinary:       - bladder incontinence   Musculoskeletal: Positive for arthralgias (L hip) and back pain.  Skin: Negative for rash.  All other systems reviewed and are negative.    Allergies  Review of patient's allergies indicates no known allergies.  Home Medications   Current Outpatient Rx  Name  Route  Sig  Dispense  Refill  . aspirin 81 MG tablet   Oral   Take 81 mg by mouth daily.          . Calcium Citrate (CITRACAL PO)   Oral   Take by mouth daily.           . flecainide (TAMBOCOR) 100 MG tablet      TAKE ONE TABLET EVERY 12 HOURS   60 tablet   11   . Lactulose SOLN   Does not apply   by Does not apply route. Takes 2 tablespoons         . Multiple Vitamins-Minerals (CENTRUM SILVER ADULT 50+ PO)   Oral   Take by  mouth.         . Omega-3 Fatty Acids (FISH OIL) 500 MG CAPS   Oral   Take 1 capsule by mouth daily.         Marland Kitchen oxyCODONE-acetaminophen (ROXICET) 5-325 MG per tablet   Oral   Take 1 tablet by mouth every 4 (four) hours as needed for severe pain.   35 tablet   0   . polyethylene glycol (MIRALAX / GLYCOLAX) packet   Oral   Take 17 g by mouth daily.         . pravastatin (PRAVACHOL) 80 MG tablet   Oral   Take 1 tablet (80 mg total) by mouth every evening.   30 tablet   11     Cancel atorvastatin please   . predniSONE (DELTASONE) 10 MG tablet      3qd for 3d then 2qd for 3d then 1qd for 3d   18 tablet   0   . triamcinolone cream (KENALOG) 0.1 %   Topical   Apply 1 application topically 2 (two) times daily.   30 g   2    Triage Vitals: BP 139/54  Pulse 72  Temp(Src) 97.6 F (36.4 C) (Oral)  Resp 18  Ht 5\' 2"  (1.575 m)  Wt 110 lb (49.896 kg)  BMI 20.11 kg/m2  SpO2 100%  Physical Exam  Nursing note and vitals reviewed. Constitutional: She is oriented to person, place, and time. She appears well-developed and well-nourished. No distress.  Appears uncomfortable   HENT:  Head: Normocephalic and atraumatic.  Eyes: Conjunctivae and EOM are normal.  Neck: Normal range of motion. Neck supple.  Cardiovascular: Normal rate and regular rhythm.  Exam reveals no gallop and no friction rub.   No murmur heard. Pulmonary/Chest: Effort normal and breath sounds normal. No respiratory distress. She has no wheezes. She has no rales.  Musculoskeletal: Normal range of motion.  Feet are warm and appear well perfused. Brisk Capillary refill in the toes. Palpable DP pulses. Mild tenderness to the posterior left proximal thigh and buttock.   Neurological: She is alert and oriented to person, place, and time.  Pt can walk unassisted but with an antalgic gait.   Skin: Skin is warm and dry. No rash noted.  Psychiatric: She has a normal mood and affect. Her behavior is normal.    ED  Course  Procedures (including critical care time)  DIAGNOSTIC STUDIES: Oxygen Saturation is 100% on room air, normal by my interpretation.    COORDINATION OF CARE: 7:22 AM-Advised pt that her symptoms seem to be from a radiculopathy from a slipped disc. Informed pt that symptoms usually get better with pain management. Discussed treatment plan which includes x-rays of the back and hip with pt at bedside and pt agreed to plan.   9:15 AM- Patient was rechecked and her symptoms have improved with medication. She is able to ambulate with more ease. Discussed discharge plan as sciatica treatment with pt and pt agreed. Advised pt to follow up with PMD.  Labs Review Labs Reviewed - No data to display Imaging Review No results found.  EKG Interpretation   None       MDM   1. Lumbar radiculopathy    Central female symptoms and exam consistent with a lumbar radiculopathy. Doubt spinal epidural hematoma, cauda equina, spinal epidural abscess. Atypical for a urologic process. Doubt AAA. No concerning "red flags." Plan symptomatic treatment at this time. Return cautions were discussed.  I personally preformed the services scribed in my presence. The recorded information has been reviewed is accurate. Raeford Razor, MD.    Raeford Razor, MD 04/26/13 2122

## 2013-04-22 NOTE — ED Notes (Signed)
Pt c/o left hip pain that radiates down through her thigh, to the front of her knee, and down to her left ankle. Saw PCP and was given percocet and prednisone on Tuesday with no relief.

## 2013-04-22 NOTE — ED Notes (Signed)
Pt alert & oriented x4, stable gait. Patient given discharge instructions, paperwork & prescription(s). Patient  instructed to stop at the registration desk to finish any additional paperwork. Patient verbalized understanding. Pt left department w/ no further questions. 

## 2013-04-24 ENCOUNTER — Ambulatory Visit (HOSPITAL_COMMUNITY)
Admission: RE | Admit: 2013-04-24 | Discharge: 2013-04-24 | Disposition: A | Discharge status: Home / Self Care | Payer: Medicare Other | Source: Ambulatory Visit | Attending: Family Medicine | Admitting: Family Medicine

## 2013-04-24 ENCOUNTER — Telehealth: Payer: Self-pay

## 2013-04-24 DIAGNOSIS — M545 Low back pain, unspecified: Secondary | ICD-10-CM | POA: Insufficient documentation

## 2013-04-24 DIAGNOSIS — M51379 Other intervertebral disc degeneration, lumbosacral region without mention of lumbar back pain or lower extremity pain: Secondary | ICD-10-CM | POA: Diagnosis not present

## 2013-04-24 DIAGNOSIS — M5137 Other intervertebral disc degeneration, lumbosacral region: Secondary | ICD-10-CM | POA: Insufficient documentation

## 2013-04-24 NOTE — Telephone Encounter (Signed)
First- most sciatica will gradually improve over 8 weeks ( not DAYS). Second, I would rec plain xrays of lumbar spine with follow up discussion. Also many insurance companies do not pay for MRI unless prolong symptoms. I sympathize with the patient and want her to get better but we would need to see her again before othewr test. plz do plain xrays, sched follow up for this week for recheck.

## 2013-04-24 NOTE — Telephone Encounter (Signed)
Notified husband [name]) first- most sciatica will gradually improve over 8 weeks ( not DAYS). Second, rec plain xrays of lumbar spine with follow up discussion. Also many insurance companies do not pay for MRI unless prolong symptoms. Sympathize with the patient and want her to get better but we would need to see her again before other test. Order for xray in system and husband verbalized understanding.

## 2013-04-24 NOTE — Telephone Encounter (Signed)
Patient was seen at [facility] ER on 04/22/13 for back pain. Needs an order for an MRI of her back please.

## 2013-04-26 ENCOUNTER — Ambulatory Visit (INDEPENDENT_AMBULATORY_CARE_PROVIDER_SITE_OTHER): Payer: Medicare Other | Admitting: Family Medicine

## 2013-04-26 ENCOUNTER — Encounter: Payer: Self-pay | Admitting: Family Medicine

## 2013-04-26 VITALS — BP 118/80 | Ht 62.0 in | Wt 110.0 lb

## 2013-04-26 DIAGNOSIS — M5430 Sciatica, unspecified side: Secondary | ICD-10-CM

## 2013-04-26 DIAGNOSIS — M5432 Sciatica, left side: Secondary | ICD-10-CM

## 2013-04-26 MED ORDER — IBUPROFEN 400 MG PO TABS: 400.0000 mg | ORAL_TABLET | Freq: Four times a day (QID) | ORAL | Status: DC | PRN

## 2013-04-26 MED ORDER — HYDROCODONE-ACETAMINOPHEN 7.5-325 MG PO TABS: 1.0000 | ORAL_TABLET | Freq: Four times a day (QID) | ORAL | Status: DC | PRN

## 2013-04-26 NOTE — Progress Notes (Signed)
   Subjective:    Patient ID: [name], female    DOB: 04-26-1941, 73 y.o.   MRN: [id number]  HPI Patient is here today for back pain. She was at the ER on 12/13. She said she is still in pain and she would like refills on her pain medications.  pmh see previous note no prior sx till past few weeks  25 minutes with pt Review of Systems     Objective:   Physical Exam Lungs/heart fine Pt now with reduced reflex, increased pain and losing strength in great toe Pain 8 out of 10 to 10/10      Assessment & Plan:  Herniated disc- needs MRI lumbar, possibly need to be in with Dr.Roy Urgent need will discuss with Dr Channing Mutters await MRI first

## 2013-04-27 ENCOUNTER — Ambulatory Visit (HOSPITAL_COMMUNITY)
Admission: RE | Admit: 2013-04-27 | Discharge: 2013-04-27 | Disposition: A | Discharge status: Home / Self Care | Payer: Medicare Other | Source: Ambulatory Visit | Attending: Family Medicine | Admitting: Family Medicine

## 2013-04-27 DIAGNOSIS — M5137 Other intervertebral disc degeneration, lumbosacral region: Secondary | ICD-10-CM | POA: Diagnosis not present

## 2013-04-27 DIAGNOSIS — M47817 Spondylosis without myelopathy or radiculopathy, lumbosacral region: Secondary | ICD-10-CM | POA: Diagnosis not present

## 2013-04-27 DIAGNOSIS — Z85038 Personal history of other malignant neoplasm of large intestine: Secondary | ICD-10-CM | POA: Insufficient documentation

## 2013-04-27 DIAGNOSIS — M5432 Sciatica, left side: Secondary | ICD-10-CM

## 2013-04-27 DIAGNOSIS — M79609 Pain in unspecified limb: Secondary | ICD-10-CM | POA: Insufficient documentation

## 2013-04-27 DIAGNOSIS — M545 Low back pain, unspecified: Secondary | ICD-10-CM | POA: Diagnosis not present

## 2013-04-28 ENCOUNTER — Telehealth: Payer: Self-pay

## 2013-04-28 NOTE — Telephone Encounter (Signed)
The patient's MRI does not show any dramatic finding but the patient herself has pain radiates down the right leg that's causing some weakness in the great toe. I am concerned about the possibility of some underlying impingement of the nerve. We will try to discuss the case with Dr. Channing Mutters. Currently she is using hydrocodone to help with pain control.

## 2013-04-28 NOTE — Telephone Encounter (Signed)
[name] states that the hospital told him that patient's MRI results would be sent to the doctor today. He wants to know the results of this test.

## 2013-04-28 NOTE — Telephone Encounter (Signed)
Notified [name] that the patient's MRI does not show any dramatic finding but the patient herself has pain radiates down the right leg that's causing some weakness in the great toe. Concerned about the possibility of some underlying impingement of the nerve. Dr. Lorin Picket discussed case with Dr. Channing Mutters and he wants to see patient at his office on Monday at 1 pm. [name] verbalized understanding.

## 2013-05-01 DIAGNOSIS — M47817 Spondylosis without myelopathy or radiculopathy, lumbosacral region: Secondary | ICD-10-CM | POA: Diagnosis not present

## 2013-05-01 DIAGNOSIS — M5126 Other intervertebral disc displacement, lumbar region: Secondary | ICD-10-CM | POA: Diagnosis not present

## 2013-05-06 ENCOUNTER — Emergency Department (HOSPITAL_COMMUNITY): Payer: Medicare Other

## 2013-05-06 ENCOUNTER — Emergency Department (HOSPITAL_COMMUNITY)
Admission: EM | Admit: 2013-05-06 | Discharge: 2013-05-06 | Disposition: A | Discharge status: Home / Self Care | Payer: Medicare Other | Attending: Emergency Medicine | Admitting: Emergency Medicine

## 2013-05-06 ENCOUNTER — Encounter (HOSPITAL_COMMUNITY): Payer: Self-pay | Admitting: Emergency Medicine

## 2013-05-06 DIAGNOSIS — M129 Arthropathy, unspecified: Secondary | ICD-10-CM | POA: Diagnosis not present

## 2013-05-06 DIAGNOSIS — Z87891 Personal history of nicotine dependence: Secondary | ICD-10-CM | POA: Insufficient documentation

## 2013-05-06 DIAGNOSIS — X500XXA Overexertion from strenuous movement or load, initial encounter: Secondary | ICD-10-CM | POA: Insufficient documentation

## 2013-05-06 DIAGNOSIS — S9030XA Contusion of unspecified foot, initial encounter: Secondary | ICD-10-CM | POA: Diagnosis not present

## 2013-05-06 DIAGNOSIS — Z79899 Other long term (current) drug therapy: Secondary | ICD-10-CM | POA: Insufficient documentation

## 2013-05-06 DIAGNOSIS — Z8742 Personal history of other diseases of the female genital tract: Secondary | ICD-10-CM | POA: Diagnosis not present

## 2013-05-06 DIAGNOSIS — I4891 Unspecified atrial fibrillation: Secondary | ICD-10-CM | POA: Diagnosis not present

## 2013-05-06 DIAGNOSIS — K219 Gastro-esophageal reflux disease without esophagitis: Secondary | ICD-10-CM | POA: Insufficient documentation

## 2013-05-06 DIAGNOSIS — Y939 Activity, unspecified: Secondary | ICD-10-CM | POA: Insufficient documentation

## 2013-05-06 DIAGNOSIS — Z7982 Long term (current) use of aspirin: Secondary | ICD-10-CM | POA: Insufficient documentation

## 2013-05-06 DIAGNOSIS — Y929 Unspecified place or not applicable: Secondary | ICD-10-CM | POA: Insufficient documentation

## 2013-05-06 DIAGNOSIS — S9032XA Contusion of left foot, initial encounter: Secondary | ICD-10-CM

## 2013-05-06 DIAGNOSIS — Z85038 Personal history of other malignant neoplasm of large intestine: Secondary | ICD-10-CM | POA: Diagnosis not present

## 2013-05-06 DIAGNOSIS — H9190 Unspecified hearing loss, unspecified ear: Secondary | ICD-10-CM | POA: Diagnosis not present

## 2013-05-06 DIAGNOSIS — E785 Hyperlipidemia, unspecified: Secondary | ICD-10-CM | POA: Insufficient documentation

## 2013-05-06 DIAGNOSIS — W010XXA Fall on same level from slipping, tripping and stumbling without subsequent striking against object, initial encounter: Secondary | ICD-10-CM | POA: Insufficient documentation

## 2013-05-06 DIAGNOSIS — S8990XA Unspecified injury of unspecified lower leg, initial encounter: Secondary | ICD-10-CM | POA: Diagnosis not present

## 2013-05-06 MED ORDER — HYDROMORPHONE HCL PF 1 MG/ML IJ SOLN
1.0000 mg | Freq: Once | INTRAMUSCULAR | Status: AC
  Administered 2013-05-06: 1 mg via INTRAMUSCULAR
  Filled 2013-05-06: qty 1

## 2013-05-06 NOTE — ED Notes (Addendum)
Fell last night.  C/o pain to left ankle.  Says she felt like her leg gave away.

## 2013-05-06 NOTE — ED Provider Notes (Signed)
CSN: [id number]     Arrival date & time 05/06/13  1249 History   This chart was scribed for Benny Lennert, MD by Clydene Laming, ED Scribe. This patient was seen in room APA06/[facility]06 and the patient's care was started at 3:18 PM.   Chief Complaint  Patient presents with  . Fall    Patient is a 72 y.o. female presenting with fall. The history is provided by the patient. No language interpreter was used.  Fall This is a new problem. The current episode started 6 to 12 hours ago. The problem occurs rarely. The problem has not changed since onset.Pertinent negatives include no chest pain, no abdominal pain and no headaches. She has tried nothing for the symptoms.   HPI Comments: [name] is a 72 y.o. female who presents to the Emergency Department complaining of a fall that occured this morning when she went to the bathroom. She states she felt like her leg "gave out". Pt reports chronic pain in the left foot and left knee which was worsened by the fall. Pt also reports bruising when she fell. Pt denies any trauma or loc. Pt is not experiencing neck pain or headache.   Past Medical History  Diagnosis Date  . Paroxysmal atrial fibrillation     Palpitations; maintained on flecainide; -normal coronary angiography and 1999; negative stress nuclear study in 11/2005  . Gastroesophageal reflux disease   . Hearing loss     mild  . Tobacco abuse, in remission     10-pack-years; quit in 2001  . 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 Hyperlipidemia   . Ovarian cyst 2008  . PONV (postoperative nausea and vomiting)   . Arthritis   . Irregular heartbeat   . Gastroparesis    Past Surgical History  Procedure Laterality Date  . Partial colectomy  2006    adenocarcinoma  . Abdominal hysterectomy  1983    partial  . Cosmetic surgery  09/2007  . Colonoscopy  03/25/2012    Malissa Hippo, MD  . Tonsillectomy    . Cholecystectomy     Family History  Problem Relation  Age of Onset  . Heart disease Mother   . Stroke Mother   . Heart attack Mother   . Cancer Father     lung  . Heart disease Father   . Hypertension Father   . Cancer Brother     rectal  . Peripheral vascular disease Brother    History  Substance Use Topics  . Smoking status: Former Smoker -- 0.30 packs/day for 40 years    Quit date: 03/25/2002  . Smokeless tobacco: Never Used     Comment: Quit smoking in 2001  . Alcohol Use: No   OB History   Grav Para Term Preterm Abortions TAB SAB Ect Mult Living                 Review of Systems  Constitutional: Negative for appetite change and fatigue.  HENT: Negative for congestion, ear discharge and sinus pressure.   Eyes: Negative for discharge.  Respiratory: Negative for cough.   Cardiovascular: Negative for chest pain.  Gastrointestinal: Negative for abdominal pain and diarrhea.  Genitourinary: Negative for frequency and hematuria.  Musculoskeletal: Negative for back pain.  Skin: Positive for color change (left foot bruising ). Negative for rash.  Neurological: Negative for seizures and headaches.  Psychiatric/Behavioral: Negative for hallucinations.    Allergies  Review of patient's  allergies indicates no known allergies.  Home Medications   Current Outpatient Rx  Name  Route  Sig  Dispense  Refill  . aspirin EC 81 MG tablet   Oral   Take 81 mg by mouth daily.         . Calcium Citrate (CITRACAL PO)   Oral   Take 1 tablet by mouth daily.          . diazepam (VALIUM) 5 MG tablet   Oral   Take 0.5 tablets (2.5 mg total) by mouth every 8 (eight) hours as needed (muscle spasm).   12 tablet   0   . flecainide (TAMBOCOR) 100 MG tablet   Oral   Take 100 mg by mouth every 12 (twelve) hours.         Marland Kitchen HYDROcodone-acetaminophen (NORCO) 7.5-325 MG per tablet   Oral   Take 1 tablet by mouth every 6 (six) hours as needed.   70 tablet   0   . ibuprofen (ADVIL,MOTRIN) 200 MG tablet   Oral   Take 400 mg by mouth  every 6 (six) hours as needed for moderate pain.         Marland Kitchen ibuprofen (ADVIL,MOTRIN) 400 MG tablet   Oral   Take 1 tablet (400 mg total) by mouth every 6 (six) hours as needed (pain).   60 tablet   0   . Lactulose SOLN   Oral   Take 30 mLs by mouth at bedtime.          . Multiple Vitamins-Minerals (CENTRUM SILVER ADULT 50+ PO)   Oral   Take 1 tablet by mouth daily.          . Omega-3 Fatty Acids (FISH OIL) 1000 MG CAPS   Oral   Take 2 capsules by mouth daily.         Marland Kitchen oxyCODONE-acetaminophen (ROXICET) 5-325 MG per tablet   Oral   Take 1 tablet by mouth every 4 (four) hours as needed for severe pain.   35 tablet   0   . polyethylene glycol (MIRALAX / GLYCOLAX) packet   Oral   Take 17 g by mouth daily.         . pravastatin (PRAVACHOL) 80 MG tablet   Oral   Take 1 tablet (80 mg total) by mouth every evening.   30 tablet   11     Cancel atorvastatin please   . triamcinolone cream (KENALOG) 0.1 %   Topical   Apply 1 application topically 2 (two) times daily.   30 g   2    BP 102/73  Pulse 73  Temp(Src) 97.6 F (36.4 C) (Oral)  Resp 20  Ht 5\' 2"  (1.575 m)  Wt 110 lb (49.896 kg)  BMI 20.11 kg/m2  SpO2 100% Physical Exam  Nursing note and vitals reviewed. Constitutional: She is oriented to person, place, and time. She appears well-developed and well-nourished.  HENT:  Head: Normocephalic and atraumatic.  Eyes: Conjunctivae are normal.  Neck: No tracheal deviation present.  Pulmonary/Chest: Effort normal.  Musculoskeletal: Normal range of motion.  Left foot swollen Tenderness over the lateral aspect of the left foot  Mild tenderness around the left knee  Neurological: She is alert and oriented to person, place, and time.  Skin: Skin is warm and dry.  Bruising and swelling of the left foot   Psychiatric: She has a normal mood and affect.    ED Course  Procedures (including critical care time) DIAGNOSTIC STUDIES:  Oxygen Saturation is 100% on  RA, normal by my interpretation.    COORDINATION OF CARE: 3:22 PM- Discussed treatment plan with pt at bedside. Pt verbalized understanding and agreement with plan.   Labs Review Labs Reviewed - No data to display Imaging Review Dg Foot Complete Left  05/06/2013   CLINICAL DATA:  Pain and bruising over the metatarsal region status post fall.  EXAM: LEFT FOOT - COMPLETE 3+ VIEW  COMPARISON:  None.  FINDINGS: Three views of the left foot reveal the bones to be adequately mineralized. There is no evidence of an acute fracture nor dislocation. Specific attention to the metatarsals reveals no acute abnormality. The interphalangeal joints, metatarsophalangeal joints, and tarsometatarsal joints appear normal. The bones of the hindfoot appear intact. The overlying soft tissues are normal in appearance.  IMPRESSION: No acute fracture or dislocation of the bones of the left foot is demonstrated.   Electronically Signed   By: David  [place]   On: 05/06/2013 13:25    EKG Interpretation   None       MDM  No diagnosis found. I personally performed the services described in this documentation, which was scribed in my presence. The recorded information has been reviewed and is accurate.     Benny Lennert, MD 05/06/13 [phone]

## 2013-05-06 NOTE — ED Notes (Addendum)
Patient with no complaints at this time. Respirations even and unlabored. Skin warm/dry. Discharge instructions reviewed with patient at this time. Patient given opportunity to voice concerns/ask questions.Patient discharged at this time and left Emergency Department via wheelchair without any difficulty

## 2013-05-10 ENCOUNTER — Other Ambulatory Visit: Payer: Self-pay | Admitting: *Deleted

## 2013-05-10 MED ORDER — DIAZEPAM 5 MG PO TABS: 2.5000 mg | ORAL_TABLET | Freq: Three times a day (TID) | ORAL | Status: DC | PRN

## 2013-05-15 DIAGNOSIS — M5137 Other intervertebral disc degeneration, lumbosacral region: Secondary | ICD-10-CM | POA: Diagnosis not present

## 2013-05-15 DIAGNOSIS — M47817 Spondylosis without myelopathy or radiculopathy, lumbosacral region: Secondary | ICD-10-CM | POA: Diagnosis not present

## 2013-05-15 DIAGNOSIS — M48061 Spinal stenosis, lumbar region without neurogenic claudication: Secondary | ICD-10-CM | POA: Diagnosis not present

## 2013-05-16 DIAGNOSIS — K219 Gastro-esophageal reflux disease without esophagitis: Secondary | ICD-10-CM | POA: Diagnosis not present

## 2013-05-16 DIAGNOSIS — M47817 Spondylosis without myelopathy or radiculopathy, lumbosacral region: Secondary | ICD-10-CM | POA: Diagnosis not present

## 2013-05-16 DIAGNOSIS — Z7982 Long term (current) use of aspirin: Secondary | ICD-10-CM | POA: Diagnosis not present

## 2013-05-16 DIAGNOSIS — Z79899 Other long term (current) drug therapy: Secondary | ICD-10-CM | POA: Diagnosis not present

## 2013-05-16 DIAGNOSIS — M545 Low back pain, unspecified: Secondary | ICD-10-CM | POA: Diagnosis not present

## 2013-05-16 DIAGNOSIS — Z85038 Personal history of other malignant neoplasm of large intestine: Secondary | ICD-10-CM | POA: Diagnosis not present

## 2013-05-16 DIAGNOSIS — M5137 Other intervertebral disc degeneration, lumbosacral region: Secondary | ICD-10-CM | POA: Diagnosis not present

## 2013-05-16 DIAGNOSIS — E785 Hyperlipidemia, unspecified: Secondary | ICD-10-CM | POA: Diagnosis not present

## 2013-05-16 DIAGNOSIS — IMO0002 Reserved for concepts with insufficient information to code with codable children: Secondary | ICD-10-CM | POA: Diagnosis not present

## 2013-05-22 DIAGNOSIS — M259 Joint disorder, unspecified: Secondary | ICD-10-CM | POA: Diagnosis not present

## 2013-05-22 DIAGNOSIS — M47817 Spondylosis without myelopathy or radiculopathy, lumbosacral region: Secondary | ICD-10-CM | POA: Diagnosis not present

## 2013-05-22 DIAGNOSIS — M5126 Other intervertebral disc displacement, lumbar region: Secondary | ICD-10-CM | POA: Diagnosis not present

## 2013-05-25 DIAGNOSIS — M259 Joint disorder, unspecified: Secondary | ICD-10-CM | POA: Diagnosis not present

## 2013-05-25 DIAGNOSIS — M25569 Pain in unspecified knee: Secondary | ICD-10-CM | POA: Diagnosis not present

## 2013-05-29 DIAGNOSIS — M47817 Spondylosis without myelopathy or radiculopathy, lumbosacral region: Secondary | ICD-10-CM | POA: Diagnosis not present

## 2013-05-29 DIAGNOSIS — M259 Joint disorder, unspecified: Secondary | ICD-10-CM | POA: Diagnosis not present

## 2013-05-29 DIAGNOSIS — M5126 Other intervertebral disc displacement, lumbar region: Secondary | ICD-10-CM | POA: Diagnosis not present

## 2013-05-31 ENCOUNTER — Ambulatory Visit (INDEPENDENT_AMBULATORY_CARE_PROVIDER_SITE_OTHER): Payer: Medicare Other | Admitting: Family Medicine

## 2013-05-31 ENCOUNTER — Encounter: Payer: Self-pay | Admitting: Family Medicine

## 2013-05-31 VITALS — BP 110/72 | Temp 97.7°F | Ht 62.0 in | Wt 108.0 lb

## 2013-05-31 DIAGNOSIS — M545 Low back pain, unspecified: Secondary | ICD-10-CM | POA: Diagnosis not present

## 2013-05-31 DIAGNOSIS — N390 Urinary tract infection, site not specified: Secondary | ICD-10-CM

## 2013-05-31 LAB — POCT URINALYSIS DIPSTICK
PH UA: 5
Protein, UA: 30
Spec Grav, UA: >=1.03

## 2013-05-31 MED ORDER — NITROFURANTOIN MONOHYD MACRO 100 MG PO CAPS: 100.0000 mg | ORAL_CAPSULE | Freq: Two times a day (BID) | ORAL | Status: AC

## 2013-05-31 NOTE — Progress Notes (Signed)
   Subjective:    Patient ID: [name], female    DOB: 1940/12/05, 73 y.o.   MRN: [id number]  Urinary Tract Infection  This is a new problem. The current episode started 1 to 4 weeks ago. The problem occurs every urination. The problem has been gradually worsening. The quality of the pain is described as burning. The pain is moderate. There has been no fever. She has tried nothing for the symptoms. The treatment provided no relief.   denies high fever chills denies hematuria has severe back problems for which she is seeing a back specialist    Review of Systems See above    Objective:   Physical Exam  Lungs are clear hearts regular subjective left side back pain      Assessment & Plan:  #1 lumbar back pain with sciatica- this patient states his surgeon will be do she was advised surgery in her best option unless she miraculously gets better ing surgery  couple weeks if not doing better she has significant foot drop #2 probable UTI Macrobid twice a day for 7 days call if ongoing troubles

## 2013-06-14 DIAGNOSIS — M5126 Other intervertebral disc displacement, lumbar region: Secondary | ICD-10-CM | POA: Diagnosis not present

## 2013-06-14 DIAGNOSIS — M259 Joint disorder, unspecified: Secondary | ICD-10-CM | POA: Diagnosis not present

## 2013-06-14 DIAGNOSIS — M47817 Spondylosis without myelopathy or radiculopathy, lumbosacral region: Secondary | ICD-10-CM | POA: Diagnosis not present

## 2013-06-16 ENCOUNTER — Other Ambulatory Visit: Payer: Self-pay | Admitting: Family Medicine

## 2013-06-16 ENCOUNTER — Telehealth (HOSPITAL_COMMUNITY): Payer: Self-pay

## 2013-06-19 ENCOUNTER — Other Ambulatory Visit: Payer: Self-pay | Admitting: Cardiology

## 2013-06-20 ENCOUNTER — Other Ambulatory Visit: Payer: Self-pay | Admitting: Family Medicine

## 2013-06-20 DIAGNOSIS — Z139 Encounter for screening, unspecified: Secondary | ICD-10-CM

## 2013-06-21 ENCOUNTER — Ambulatory Visit: Payer: Medicare Other | Admitting: Cardiology

## 2013-06-21 ENCOUNTER — Ambulatory Visit (INDEPENDENT_AMBULATORY_CARE_PROVIDER_SITE_OTHER): Payer: Medicare Other | Admitting: Cardiology

## 2013-06-21 ENCOUNTER — Ambulatory Visit (HOSPITAL_COMMUNITY): Payer: Medicare Other | Admitting: Physical Therapy

## 2013-06-21 ENCOUNTER — Encounter: Payer: Self-pay | Admitting: Cardiology

## 2013-06-21 VITALS — BP 124/58 | HR 70 | Ht 62.0 in | Wt 109.0 lb

## 2013-06-21 DIAGNOSIS — I4891 Unspecified atrial fibrillation: Secondary | ICD-10-CM

## 2013-06-21 DIAGNOSIS — R0789 Other chest pain: Secondary | ICD-10-CM

## 2013-06-21 MED ORDER — APIXABAN 5 MG PO TABS: 5.0000 mg | ORAL_TABLET | Freq: Two times a day (BID) | ORAL | Status: DC

## 2013-06-21 NOTE — Patient Instructions (Signed)
Your physician wants you to follow-up in: 6 MONTHS You will receive a reminder letter in the mail two months in advance. If you don't receive a letter, please call our office to schedule the follow-up appointment.  Your physician has recommended you make the following change in your medication:   1) STOP TAKING ASPIRIN 81MG   2) START TAKING ELIQUIS 5MG  TWICE DAILY

## 2013-06-21 NOTE — Progress Notes (Signed)
Clinical Summary [name] is a 73 y.o.female last seen by NP [name], this is our first visit together. She is seen for the following medical problems.  1. Afib - on flecanide, denies any recent palpitations - compliant with aspirin, she previously has not been on anticoagulation.   2. Atypical chest pain - described at last visit with NP [name] - since last visit had MPI, low risk study with no ischemia - chest pain resolved   Past Medical History  Diagnosis Date  . Paroxysmal atrial fibrillation     Palpitations; maintained on flecainide; -normal coronary angiography and 1999; negative stress nuclear study in 11/2005  . Gastroesophageal reflux disease   . Hearing loss     mild  . Tobacco abuse, in remission     10-pack-years; quit in 2001  . 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 Hyperlipidemia   . Ovarian cyst 2008  . PONV (postoperative nausea and vomiting)   . Arthritis   . Irregular heartbeat   . Gastroparesis      No Known Allergies   Current Outpatient Prescriptions  Medication Sig Dispense Refill  . aspirin EC 81 MG tablet Take 81 mg by mouth every morning.       . Calcium Citrate (CITRACAL PO) Take 1 tablet by mouth every morning.       . diazepam (VALIUM) 5 MG tablet Take 0.5 tablets (2.5 mg total) by mouth every 8 (eight) hours as needed (muscle spasm).  12 tablet  0  . flecainide (TAMBOCOR) 100 MG tablet Take 100 mg by mouth every 12 (twelve) hours.      . flecainide (TAMBOCOR) 100 MG tablet TAKE ONE TABLET EVERY 12 HOURS  60 tablet  6  . ibuprofen (ADVIL,MOTRIN) 200 MG tablet Take 400 mg by mouth every 6 (six) hours as needed for moderate pain.      Marland Kitchen ibuprofen (ADVIL,MOTRIN) 400 MG tablet Take 1 tablet (400 mg total) by mouth every 6 (six) hours as needed (pain).  60 tablet  0  . Lactulose SOLN Take 30 mLs by mouth at bedtime.       . Multiple Vitamins-Minerals (CENTRUM SILVER ADULT 50+ PO) Take 1 tablet by mouth daily.        . Omega-3 Fatty Acids (FISH OIL) 1000 MG CAPS Take 2 capsules by mouth daily.      Earney Navy Bicarbonate (ZEGERID) 20-1100 MG CAPS capsule Take 1 capsule by mouth daily before breakfast.      . polyethylene glycol (MIRALAX / GLYCOLAX) packet Take 17 g by mouth daily.      . pravastatin (PRAVACHOL) 80 MG tablet Take 1 tablet (80 mg total) by mouth every evening.  30 tablet  11   No current facility-administered medications for this visit.     Past Surgical History  Procedure Laterality Date  . Partial colectomy  2006    adenocarcinoma  . Abdominal hysterectomy  1983    partial  . Cosmetic surgery  09/2007  . Colonoscopy  03/25/2012    Lisa Houston, MD  . Tonsillectomy    . Cholecystectomy       No Known Allergies    Family History  Problem Relation Age of Onset  . Heart disease Mother   . Stroke Mother   . Heart attack Mother   . Cancer Father     lung  . Heart disease Father   . Hypertension Father   .  Cancer Brother     rectal  . Peripheral vascular disease Brother      Social History [name] reports that she quit smoking about 11 years ago. She has never used smokeless tobacco. [name] reports that she does not drink alcohol.   Review of Systems CONSTITUTIONAL: No weight loss, fever, chills, weakness or fatigue.  HEENT: Eyes: No visual loss, blurred vision, double vision or yellow sclerae.No hearing loss, sneezing, congestion, runny nose or sore throat.  SKIN: No rash or itching.  CARDIOVASCULAR: per HPI RESPIRATORY: No shortness of breath, cough or sputum.  GASTROINTESTINAL: No anorexia, nausea, vomiting or diarrhea. No abdominal pain or blood.  GENITOURINARY: No burning on urination, no polyuria NEUROLOGICAL: No headache, dizziness, syncope, paralysis, ataxia, numbness or tingling in the extremities. No change in bowel or bladder control.  MUSCULOSKELETAL: No muscle, back pain, joint pain or stiffness.  LYMPHATICS: No enlarged nodes.  No history of splenectomy.  PSYCHIATRIC: No history of depression or anxiety.  ENDOCRINOLOGIC: No reports of sweating, cold or heat intolerance. No polyuria or polydipsia.  Marland Kitchen   Physical Examination p 70 bp 124/58 Wt 109 lbs BMI 20 Gen: resting comfortably, no acute distress HEENT: no scleral icterus, pupils equal round and reactive, no palptable cervical adenopathy,  CV: RRR, no m/r/g, no JVD, no carotid bruits Resp: Clear to auscultation bilaterally GI: abdomen is soft, non-tender, non-distended, normal bowel sounds, no hepatosplenomegaly MSK: extremities are warm, no edema.  Skin: warm, no rash Neuro:  no focal deficits Psych: appropriate affect   Diagnostic Studies  03/2013 MPI Overall low risk exercise Cardiolite. Equivocal ST segment abnormalities were noted, mild chest pain at peak exertion, otherwise no definite arrhythmias. Perfusion imaging is consistent with breast attenuation affecting the mid to apical anteroseptal wall, no definite ischemic defects. LVEF is 71% with normal volumes and no wall motion abnormalities.    Assessment and Plan  1. Afib - no current symptoms, continue flecanide - CHADS2Vasc score is 2, will start eliquis 5mg  bid  2. Atypical chest pain - resolved - low risk MPI with no evidence of ischemia - no further testing at this time.       Arnoldo Lenis, M.D., F.A.C.C.

## 2013-06-22 ENCOUNTER — Ambulatory Visit (HOSPITAL_COMMUNITY)
Admission: RE | Admit: 2013-06-22 | Discharge: 2013-06-22 | Disposition: A | Discharge status: Home / Self Care | Payer: Medicare Other | Source: Ambulatory Visit | Attending: Family Medicine | Admitting: Family Medicine

## 2013-06-22 DIAGNOSIS — Z139 Encounter for screening, unspecified: Secondary | ICD-10-CM

## 2013-06-22 DIAGNOSIS — Z1231 Encounter for screening mammogram for malignant neoplasm of breast: Secondary | ICD-10-CM | POA: Diagnosis not present

## 2013-06-28 DIAGNOSIS — M47817 Spondylosis without myelopathy or radiculopathy, lumbosacral region: Secondary | ICD-10-CM | POA: Diagnosis not present

## 2013-06-28 DIAGNOSIS — M5126 Other intervertebral disc displacement, lumbar region: Secondary | ICD-10-CM | POA: Diagnosis not present

## 2013-06-29 ENCOUNTER — Telehealth: Payer: Self-pay | Admitting: *Deleted

## 2013-06-29 NOTE — Telephone Encounter (Signed)
I would like to address all the patients questions, it is a lot to discuss over the phone, my preference would be to have her come in and see me in [facility] next week to discuss in detail. Please offer this to her, if she prefers talking by phone I will try to give her a call tomorrow or early next week.    Carlyle Dolly MD

## 2013-06-29 NOTE — Telephone Encounter (Signed)
Pt is not sure if she would like to take eliquis due to the side effects she heard about.

## 2013-06-29 NOTE — Telephone Encounter (Signed)
Called pt to confirm what she heard about the side effects of eliquis, pt advised that she heard this from the pharmacy and noted if she needs to be concerned with taking this medication per if currently having spinal injections, pt noted she had a spinal injection January 10-2013 and notes she is not scheduled to have anymore at this time however she was advised if the pain does not cease she can have 2-3 more however the pt notes the injection did assist and she does not plan to get another one, pt also notes concerns with not having her blood checked to see if its too thick or thin, pt advised she is NOT taking this medication due to these concerns, pt made aware that Dr Harl Bowie is not in the office today however we will contact her once we are advised the next steps, pt understood

## 2013-06-29 NOTE — Telephone Encounter (Signed)
Pt scheduled apt to see Dr Harl Bowie in the [place] office 07-07-13 at 1pm to discuss, pt advised she is still not taking this medication until she discusses this with him further, pt understood and accepted apt

## 2013-07-07 ENCOUNTER — Encounter: Payer: Self-pay | Admitting: Cardiology

## 2013-07-07 ENCOUNTER — Ambulatory Visit (INDEPENDENT_AMBULATORY_CARE_PROVIDER_SITE_OTHER): Payer: Medicare Other | Admitting: Cardiology

## 2013-07-07 VITALS — BP 138/78 | HR 73 | Ht 62.0 in | Wt 108.0 lb

## 2013-07-07 DIAGNOSIS — I4891 Unspecified atrial fibrillation: Secondary | ICD-10-CM

## 2013-07-07 NOTE — Patient Instructions (Signed)
   Stop Aspirin Continue all other medications.   Your physician wants you to follow up in:  1 year.  You will receive a reminder letter in the mail one-two months in advance.  If you don't receive a letter, please call our office to schedule the follow up appointment

## 2013-07-07 NOTE — Progress Notes (Signed)
Clinical Summary [name] is a 73 y.o.female seen today for a follow up appointment. This is a focused visit to discuss her history of afib as well as indications for anticoagulation per patient request  1. Afib  - on flecanide, denies any recent palpitations  - last visit initiated eliquis for stroke prophylaxis, however she has not started taking due to concerns of possible side effects she saw on television. She has continued ASA     Past Medical History  Diagnosis Date  . Paroxysmal atrial fibrillation     Palpitations; maintained on flecainide; -normal coronary angiography and 1999; negative stress nuclear study in 11/2005  . Gastroesophageal reflux disease   . Hearing loss     mild  . Tobacco abuse, in remission     10-pack-years; quit in 2001  . 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 Hyperlipidemia   . Ovarian cyst 2008  . PONV (postoperative nausea and vomiting)   . Arthritis   . Irregular heartbeat   . Gastroparesis      No Known Allergies   Current Outpatient Prescriptions  Medication Sig Dispense Refill  . apixaban (ELIQUIS) 5 MG TABS tablet Take 1 tablet (5 mg total) by mouth 2 (two) times daily.  60 tablet  6  . aspirin EC 81 MG tablet Take 81 mg by mouth every morning.       . Calcium Citrate (CITRACAL PO) Take 1 tablet by mouth every morning.       . diazepam (VALIUM) 5 MG tablet Take 0.5 tablets (2.5 mg total) by mouth every 8 (eight) hours as needed (muscle spasm).  12 tablet  0  . flecainide (TAMBOCOR) 100 MG tablet TAKE ONE TABLET EVERY 12 HOURS  60 tablet  6  . HYDROcodone-acetaminophen (NORCO) 10-325 MG per tablet as directed.      Marland Kitchen ibuprofen (ADVIL,MOTRIN) 400 MG tablet Take 1 tablet (400 mg total) by mouth every 6 (six) hours as needed (pain).  60 tablet  0  . Lactulose SOLN Take 30 mLs by mouth at bedtime.       . Multiple Vitamins-Minerals (CENTRUM SILVER ADULT 50+ PO) Take 1 tablet by mouth daily.       . Omega-3  Fatty Acids (FISH OIL) 1000 MG CAPS Take 2 capsules by mouth daily.      Earney Navy Bicarbonate (ZEGERID) 20-1100 MG CAPS capsule Take 1 capsule by mouth daily before breakfast.      . polyethylene glycol (MIRALAX / GLYCOLAX) packet Take 17 g by mouth daily.      . pravastatin (PRAVACHOL) 80 MG tablet Take 1 tablet (80 mg total) by mouth every evening.  30 tablet  11   No current facility-administered medications for this visit.     Past Surgical History  Procedure Laterality Date  . Partial colectomy  2006    adenocarcinoma  . Abdominal hysterectomy  1983    partial  . Cosmetic surgery  09/2007  . Colonoscopy  03/25/2012    [name], MD  . Tonsillectomy    . Cholecystectomy       No Known Allergies    Family History  Problem Relation Age of Onset  . Heart disease Mother   . Stroke Mother   . Heart attack Mother   . Cancer Father     lung  . Heart disease Father   . Hypertension Father   . Cancer Brother  rectal  . Peripheral vascular disease Brother      Social History [name] reports that she quit smoking about 11 years ago. She has never used smokeless tobacco. [name] reports that she does not drink alcohol.   Review of Systems CONSTITUTIONAL: No weight loss, fever, chills, weakness or fatigue.  HEENT: Eyes: No visual loss, blurred vision, double vision or yellow sclerae.No hearing loss, sneezing, congestion, runny nose or sore throat.  SKIN: No rash or itching.  CARDIOVASCULAR: per [facility] RESPIRATORY: No shortness of breath, cough or sputum.  GASTROINTESTINAL: No anorexia, nausea, vomiting or diarrhea. No abdominal pain or blood.  GENITOURINARY: No burning on urination, no polyuria NEUROLOGICAL: No headache, dizziness, syncope, paralysis, ataxia, numbness or tingling in the extremities. No change in bowel or bladder control.  MUSCULOSKELETAL: No muscle, back pain, joint pain or stiffness.  LYMPHATICS: No enlarged nodes. No history of  splenectomy.  PSYCHIATRIC: No history of depression or anxiety.  ENDOCRINOLOGIC: No reports of sweating, cold or heat intolerance. No polyuria or polydipsia.  Marland Kitchen   Physical Examination p 73 bp 138/78 Wt 108 lbs BMI 20 Gen: resting comfortably, no acute distress HEENT: no scleral icterus, pupils equal round and reactive, no palptable cervical adenopathy,  CV: RRR, no m/r/g, no JVD, no carotis bruits Resp: Clear to auscultation bilaterally GI: abdomen is soft, non-tender, non-distended, normal bowel sounds, no hepatosplenomegaly MSK: extremities are warm, no edema.  Skin: warm, no rash Neuro:  no focal deficits Psych: appropriate affect   Diagnostic Studies 03/2013 MPI  Overall low risk exercise Cardiolite. Equivocal ST segment abnormalities were noted, mild chest pain at peak exertion, otherwise no definite arrhythmias. Perfusion imaging is consistent with breast attenuation affecting the mid to apical anteroseptal wall, no definite ischemic defects. LVEF is 71% with normal volumes and no wall motion abnormalities.      Assessment and Plan  1. Afib  - no current symptoms, continue flecanide  - discussed in detail the risk and benefits of anticoagulation with her and her husband today in clinic. Discussion time was 20 minutes - CHADS2Vasc score is 2, and oral anticoag is indicated - she will starl eliquis 5mg  bid, we will stop her ASA.    Follow up 1 year   Arnoldo Lenis, M.D., F.A.C.C.

## 2013-07-11 ENCOUNTER — Encounter: Payer: Self-pay | Admitting: Adult Health

## 2013-07-11 NOTE — Progress Notes (Signed)
Mrs. Quinnell accompanied her husband today PERRLA TLaurance Flatten, during his appointment she insisted on talking to me about all of her medications and concerns with Eliquis. The patient has had issues with her back in his had lumbar punctures, and plans to have back surgery. She brought with her a long list of questions concerning Eliquis. She also brought with her the package insert for Eliquis. She is concerned that she will need to stay on NSAIDs along with Eliquis and does not wish to take the Eliquis any longer. In fact, she never started it. I advised her to discuss this further with Dr. Pamala Hurry, but she refused stating she was here with her husband and had questions that can be answered by me.  I spent 20 minutes with this patient discussing her concerns, going over her medications, in reviewing the risks, not being on anticoagulation with history of atrial fibrillation and a CHADs Vasc score of 3. She refuses Eliquis is willing to take the risk. She plans on having back surgery within the next several months.

## 2013-07-18 DIAGNOSIS — M5126 Other intervertebral disc displacement, lumbar region: Secondary | ICD-10-CM | POA: Diagnosis not present

## 2013-08-02 DIAGNOSIS — Z7982 Long term (current) use of aspirin: Secondary | ICD-10-CM | POA: Diagnosis not present

## 2013-08-02 DIAGNOSIS — Z85038 Personal history of other malignant neoplasm of large intestine: Secondary | ICD-10-CM | POA: Diagnosis not present

## 2013-08-02 DIAGNOSIS — E785 Hyperlipidemia, unspecified: Secondary | ICD-10-CM | POA: Diagnosis not present

## 2013-08-02 DIAGNOSIS — M5126 Other intervertebral disc displacement, lumbar region: Secondary | ICD-10-CM | POA: Diagnosis not present

## 2013-08-02 DIAGNOSIS — Z01818 Encounter for other preprocedural examination: Secondary | ICD-10-CM | POA: Diagnosis not present

## 2013-08-02 DIAGNOSIS — K219 Gastro-esophageal reflux disease without esophagitis: Secondary | ICD-10-CM | POA: Diagnosis not present

## 2013-08-02 DIAGNOSIS — I4891 Unspecified atrial fibrillation: Secondary | ICD-10-CM | POA: Diagnosis not present

## 2013-08-02 DIAGNOSIS — Z79899 Other long term (current) drug therapy: Secondary | ICD-10-CM | POA: Diagnosis not present

## 2013-08-07 DIAGNOSIS — I4891 Unspecified atrial fibrillation: Secondary | ICD-10-CM | POA: Diagnosis not present

## 2013-08-07 DIAGNOSIS — Z85038 Personal history of other malignant neoplasm of large intestine: Secondary | ICD-10-CM | POA: Diagnosis not present

## 2013-08-07 DIAGNOSIS — Z049 Encounter for examination and observation for unspecified reason: Secondary | ICD-10-CM | POA: Diagnosis not present

## 2013-08-07 DIAGNOSIS — E785 Hyperlipidemia, unspecified: Secondary | ICD-10-CM | POA: Diagnosis not present

## 2013-08-07 DIAGNOSIS — Z7982 Long term (current) use of aspirin: Secondary | ICD-10-CM | POA: Diagnosis not present

## 2013-08-07 DIAGNOSIS — K219 Gastro-esophageal reflux disease without esophagitis: Secondary | ICD-10-CM | POA: Diagnosis not present

## 2013-08-07 DIAGNOSIS — M5126 Other intervertebral disc displacement, lumbar region: Secondary | ICD-10-CM | POA: Diagnosis not present

## 2013-08-07 DIAGNOSIS — M539 Dorsopathy, unspecified: Secondary | ICD-10-CM | POA: Diagnosis not present

## 2013-08-07 DIAGNOSIS — Z79899 Other long term (current) drug therapy: Secondary | ICD-10-CM | POA: Diagnosis not present

## 2013-08-08 DIAGNOSIS — E785 Hyperlipidemia, unspecified: Secondary | ICD-10-CM | POA: Diagnosis not present

## 2013-08-08 DIAGNOSIS — K219 Gastro-esophageal reflux disease without esophagitis: Secondary | ICD-10-CM | POA: Diagnosis not present

## 2013-08-08 DIAGNOSIS — I4891 Unspecified atrial fibrillation: Secondary | ICD-10-CM | POA: Diagnosis not present

## 2013-08-08 DIAGNOSIS — M5126 Other intervertebral disc displacement, lumbar region: Secondary | ICD-10-CM | POA: Diagnosis not present

## 2013-08-08 DIAGNOSIS — Z85038 Personal history of other malignant neoplasm of large intestine: Secondary | ICD-10-CM | POA: Diagnosis not present

## 2013-08-08 DIAGNOSIS — Z79899 Other long term (current) drug therapy: Secondary | ICD-10-CM | POA: Diagnosis not present

## 2013-09-04 DIAGNOSIS — R52 Pain, unspecified: Secondary | ICD-10-CM | POA: Diagnosis not present

## 2013-09-04 DIAGNOSIS — M79609 Pain in unspecified limb: Secondary | ICD-10-CM | POA: Diagnosis not present

## 2013-09-04 DIAGNOSIS — M25579 Pain in unspecified ankle and joints of unspecified foot: Secondary | ICD-10-CM | POA: Diagnosis not present

## 2013-09-06 DIAGNOSIS — M25569 Pain in unspecified knee: Secondary | ICD-10-CM | POA: Diagnosis not present

## 2013-10-13 ENCOUNTER — Telehealth: Payer: Self-pay | Admitting: Family Medicine

## 2013-11-07 DIAGNOSIS — M25579 Pain in unspecified ankle and joints of unspecified foot: Secondary | ICD-10-CM | POA: Diagnosis not present

## 2013-11-07 DIAGNOSIS — M79609 Pain in unspecified limb: Secondary | ICD-10-CM | POA: Diagnosis not present

## 2013-11-21 DIAGNOSIS — G608 Other hereditary and idiopathic neuropathies: Secondary | ICD-10-CM | POA: Diagnosis not present

## 2013-11-21 DIAGNOSIS — G5790 Unspecified mononeuropathy of unspecified lower limb: Secondary | ICD-10-CM | POA: Diagnosis not present

## 2013-11-21 DIAGNOSIS — M79609 Pain in unspecified limb: Secondary | ICD-10-CM | POA: Diagnosis not present

## 2013-11-23 DIAGNOSIS — R52 Pain, unspecified: Secondary | ICD-10-CM | POA: Diagnosis not present

## 2013-11-23 DIAGNOSIS — M129 Arthropathy, unspecified: Secondary | ICD-10-CM | POA: Diagnosis not present

## 2013-11-23 DIAGNOSIS — M25569 Pain in unspecified knee: Secondary | ICD-10-CM | POA: Diagnosis not present

## 2013-11-24 DIAGNOSIS — Z0000 Encounter for general adult medical examination without abnormal findings: Secondary | ICD-10-CM | POA: Diagnosis not present

## 2013-12-06 DIAGNOSIS — H5200 Hypermetropia, unspecified eye: Secondary | ICD-10-CM | POA: Diagnosis not present

## 2013-12-06 DIAGNOSIS — H524 Presbyopia: Secondary | ICD-10-CM | POA: Diagnosis not present

## 2013-12-06 DIAGNOSIS — H52229 Regular astigmatism, unspecified eye: Secondary | ICD-10-CM | POA: Diagnosis not present

## 2013-12-06 DIAGNOSIS — H2510 Age-related nuclear cataract, unspecified eye: Secondary | ICD-10-CM | POA: Diagnosis not present

## 2013-12-12 ENCOUNTER — Telehealth: Payer: Self-pay | Admitting: Family Medicine

## 2013-12-12 DIAGNOSIS — E782 Mixed hyperlipidemia: Secondary | ICD-10-CM

## 2013-12-12 DIAGNOSIS — Z79899 Other long term (current) drug therapy: Secondary | ICD-10-CM

## 2013-12-12 DIAGNOSIS — R7301 Impaired fasting glucose: Secondary | ICD-10-CM

## 2013-12-12 DIAGNOSIS — R5383 Other fatigue: Secondary | ICD-10-CM

## 2013-12-12 DIAGNOSIS — R5381 Other malaise: Secondary | ICD-10-CM

## 2013-12-12 NOTE — Telephone Encounter (Signed)
Pt has appt 01/01/14, is lab work needed, please call pt when done

## 2013-12-12 NOTE — Telephone Encounter (Signed)
Lipid, liver, CBC 12/2012

## 2013-12-13 DIAGNOSIS — M259 Joint disorder, unspecified: Secondary | ICD-10-CM | POA: Diagnosis not present

## 2013-12-13 DIAGNOSIS — M5126 Other intervertebral disc displacement, lumbar region: Secondary | ICD-10-CM | POA: Diagnosis not present

## 2013-12-13 DIAGNOSIS — M47817 Spondylosis without myelopathy or radiculopathy, lumbosacral region: Secondary | ICD-10-CM | POA: Diagnosis not present

## 2013-12-13 NOTE — Telephone Encounter (Signed)
Left message on voicemail notifying patient that blood work has been ordered.  

## 2013-12-13 NOTE — Telephone Encounter (Signed)
CBC, lipid, liver, metastases 7, hemoglobin A1c

## 2013-12-19 DIAGNOSIS — M79609 Pain in unspecified limb: Secondary | ICD-10-CM | POA: Diagnosis not present

## 2013-12-19 DIAGNOSIS — G608 Other hereditary and idiopathic neuropathies: Secondary | ICD-10-CM | POA: Diagnosis not present

## 2013-12-19 DIAGNOSIS — G5790 Unspecified mononeuropathy of unspecified lower limb: Secondary | ICD-10-CM | POA: Diagnosis not present

## 2013-12-22 ENCOUNTER — Other Ambulatory Visit: Payer: Self-pay | Admitting: Family Medicine

## 2013-12-25 DIAGNOSIS — E782 Mixed hyperlipidemia: Secondary | ICD-10-CM | POA: Diagnosis not present

## 2013-12-25 DIAGNOSIS — R5381 Other malaise: Secondary | ICD-10-CM | POA: Diagnosis not present

## 2013-12-25 DIAGNOSIS — R5383 Other fatigue: Secondary | ICD-10-CM | POA: Diagnosis not present

## 2013-12-25 DIAGNOSIS — Z79899 Other long term (current) drug therapy: Secondary | ICD-10-CM | POA: Diagnosis not present

## 2013-12-25 DIAGNOSIS — R7301 Impaired fasting glucose: Secondary | ICD-10-CM | POA: Diagnosis not present

## 2013-12-25 LAB — LIPID PANEL
Cholesterol: 227 mg/dL — ABNORMAL HIGH (ref 0–200)
HDL: 78 mg/dL (ref >39)
LDL Cholesterol: 127 mg/dL — ABNORMAL HIGH (ref 0–99)
TRIGLYCERIDES: 111 mg/dL (ref <150)
Total CHOL/HDL Ratio: 2.9 Ratio
VLDL: 22 mg/dL (ref 0–40)

## 2013-12-25 LAB — CBC WITH DIFFERENTIAL/PLATELET
BASOS PCT: 0 % (ref 0–1)
Basophils Absolute: 0 10*3/uL (ref 0.0–0.1)
EOS ABS: 0.1 10*3/uL (ref 0.0–0.7)
Eosinophils Relative: 1 % (ref 0–5)
HCT: 38.6 % (ref 36.0–46.0)
HEMOGLOBIN: 13 g/dL (ref 12.0–15.0)
Lymphocytes Relative: 38 % (ref 12–46)
Lymphs Abs: 2.6 10*3/uL (ref 0.7–4.0)
MCH: 33.5 pg (ref 26.0–34.0)
MCHC: 33.7 g/dL (ref 30.0–36.0)
MCV: 99.5 fL (ref 78.0–100.0)
MONOS PCT: 9 % (ref 3–12)
Monocytes Absolute: 0.6 10*3/uL (ref 0.1–1.0)
NEUTROS ABS: 3.5 10*3/uL (ref 1.7–7.7)
Neutrophils Relative %: 52 % (ref 43–77)
Platelets: 301 10*3/uL (ref 150–400)
RBC: 3.88 MIL/uL (ref 3.87–5.11)
RDW: 12.9 % (ref 11.5–15.5)
WBC: 6.8 10*3/uL (ref 4.0–10.5)

## 2013-12-25 LAB — HEPATIC FUNCTION PANEL
ALBUMIN: 4.2 g/dL (ref 3.5–5.2)
ALT: 14 U/L (ref 0–35)
AST: 18 U/L (ref 0–37)
Alkaline Phosphatase: 51 U/L (ref 39–117)
BILIRUBIN INDIRECT: 0.4 mg/dL (ref 0.2–1.2)
Bilirubin, Direct: 0.1 mg/dL (ref 0.0–0.3)
Total Bilirubin: 0.5 mg/dL (ref 0.2–1.2)
Total Protein: 6.5 g/dL (ref 6.0–8.3)

## 2013-12-25 LAB — BASIC METABOLIC PANEL
BUN: 24 mg/dL — AB (ref 6–23)
CHLORIDE: 104 meq/L (ref 96–112)
CO2: 27 mEq/L (ref 19–32)
Calcium: 9.3 mg/dL (ref 8.4–10.5)
Creat: 0.94 mg/dL (ref 0.50–1.10)
Glucose, Bld: 86 mg/dL (ref 70–99)
Potassium: 4.5 mEq/L (ref 3.5–5.3)
Sodium: 141 mEq/L (ref 135–145)

## 2013-12-25 LAB — HEMOGLOBIN A1C
Hgb A1c MFr Bld: 5.8 % — ABNORMAL HIGH (ref <5.7)
Mean Plasma Glucose: 120 mg/dL — ABNORMAL HIGH (ref <117)

## 2014-01-01 ENCOUNTER — Encounter: Payer: Self-pay | Admitting: Family Medicine

## 2014-01-01 ENCOUNTER — Ambulatory Visit (INDEPENDENT_AMBULATORY_CARE_PROVIDER_SITE_OTHER): Payer: Medicare Other | Admitting: Family Medicine

## 2014-01-01 VITALS — BP 104/68 | Ht 62.0 in | Wt 108.0 lb

## 2014-01-01 DIAGNOSIS — M5430 Sciatica, unspecified side: Secondary | ICD-10-CM | POA: Diagnosis not present

## 2014-01-01 DIAGNOSIS — R079 Chest pain, unspecified: Secondary | ICD-10-CM

## 2014-01-01 DIAGNOSIS — M5432 Sciatica, left side: Secondary | ICD-10-CM

## 2014-01-01 DIAGNOSIS — E785 Hyperlipidemia, unspecified: Secondary | ICD-10-CM | POA: Diagnosis not present

## 2014-01-01 DIAGNOSIS — K219 Gastro-esophageal reflux disease without esophagitis: Secondary | ICD-10-CM

## 2014-01-01 MED ORDER — DULOXETINE HCL 20 MG PO CPEP: 20.0000 mg | ORAL_CAPSULE | Freq: Every day | ORAL | Status: DC

## 2014-01-01 MED ORDER — ROSUVASTATIN CALCIUM 10 MG PO TABS: 10.0000 mg | ORAL_TABLET | Freq: Every day | ORAL | Status: DC

## 2014-01-01 NOTE — Progress Notes (Signed)
   Subjective:    Patient ID: [name], female    DOB: [birth year], 73 y.o.   MRN: [id number]  HPIFollow up test results.   Would like to discuss starting eliquis. Cardiologist put pt on  But she has not started med yet. She is taking asa 81mg .  Patient had worries about starting the medicine she wasn't sure she really needed it.  Did back exercises since surgery bMarch 2015. She does have some back pain she also relates burning aching in the left knee in the left but nothing seems to help she has seen podiatry and orthopedics without help.  She denies chest pressure with activity but she's not able to exercise much today she's had heartburn 2 or 3 separate times. She states it was relieved with Rolaids. She denies shortness of breath or sweating  Recent lab work reviewed in detail.  Review of Systems  Constitutional: Negative for activity change, appetite change and fatigue.  Respiratory: Negative for cough and chest tightness.   Cardiovascular: Negative for chest pain and leg swelling.  Gastrointestinal: Negative for abdominal pain.       Mild reflux symtoms  Endocrine: Negative for polydipsia and polyphagia.  Genitourinary: Negative for frequency.  Neurological: Negative for weakness.  Psychiatric/Behavioral: Negative for confusion.       Objective:   Physical Exam  Vitals reviewed. Constitutional: She appears well-nourished. No distress.  Cardiovascular: Normal rate, regular rhythm and normal heart sounds.   No murmur heard. Pulmonary/Chest: Effort normal and breath sounds normal. No respiratory distress.  Musculoskeletal: She exhibits no edema.  Lymphadenopathy:    She has no cervical adenopathy.  Neurological: She is alert. She exhibits normal muscle tone.  Psychiatric: Her behavior is normal.          Assessment & Plan:  #1 hyperlipidemia some poor control stop pravastatin. Change to Crestor 10 mg daily if for any reason having difficulty tolerating medicines  and to notify us. Recheck lipid profile approximately 3-4 months.  #2 irregular heart-we discussed at length the importance of her venting a stroke patient agrees to starting Eliquis that the cardiologist put her on. She is aware that there is a risk of bleeding and if she should have any severe headaches vomiting of blood or rectal bleeding immediately go to the ER called 911.  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  #4 patient with reflux symptoms intermittently she is using omeprazole she's had some heartburn. I doubt that this is angina because her HDL is fantastic LDL is slightly elevated. EKG looks good. Encourage patient to use the Zegerid daily as well as Mylanta or Maalox. If ongoing troubles notifies.  Followup 3 months lab work at that time

## 2014-01-02 DIAGNOSIS — R52 Pain, unspecified: Secondary | ICD-10-CM | POA: Diagnosis not present

## 2014-01-02 DIAGNOSIS — Z5189 Encounter for other specified aftercare: Secondary | ICD-10-CM | POA: Diagnosis not present

## 2014-01-02 DIAGNOSIS — M1710 Unilateral primary osteoarthritis, unspecified knee: Secondary | ICD-10-CM | POA: Diagnosis not present

## 2014-01-10 ENCOUNTER — Ambulatory Visit (HOSPITAL_COMMUNITY)
Admission: RE | Admit: 2014-01-10 | Discharge: 2014-01-10 | Disposition: A | Discharge status: Home / Self Care | Payer: Medicare Other | Source: Ambulatory Visit | Attending: Neurosurgery | Admitting: Neurosurgery

## 2014-01-10 DIAGNOSIS — IMO0001 Reserved for inherently not codable concepts without codable children: Secondary | ICD-10-CM | POA: Insufficient documentation

## 2014-01-10 DIAGNOSIS — E785 Hyperlipidemia, unspecified: Secondary | ICD-10-CM | POA: Diagnosis not present

## 2014-01-10 DIAGNOSIS — M5440 Lumbago with sciatica, unspecified side: Secondary | ICD-10-CM | POA: Insufficient documentation

## 2014-01-10 DIAGNOSIS — G8929 Other chronic pain: Secondary | ICD-10-CM | POA: Insufficient documentation

## 2014-01-10 DIAGNOSIS — M545 Low back pain, unspecified: Secondary | ICD-10-CM | POA: Insufficient documentation

## 2014-01-10 DIAGNOSIS — M6281 Muscle weakness (generalized): Secondary | ICD-10-CM | POA: Diagnosis not present

## 2014-01-10 DIAGNOSIS — R262 Difficulty in walking, not elsewhere classified: Secondary | ICD-10-CM | POA: Diagnosis not present

## 2014-01-10 NOTE — Evaluation (Signed)
Physical Therapy Evaluation  Patient Details  Name: [name] MRN: [id number] Date of Birth: 1941-04-22  Today's Date: 01/10/2014 Time: 4132-4401 PT Time Calculation (min): 46 min     Charges 1 Eval, TE [id number]         Visit#: 1 of 16  Re-eval: 02/09/14 Assessment Diagnosis: Low back pain secondary to history of lumbar disc rupture and spondylosis Surgical Date: 08/08/13 Next MD Visit: October 6 [name] Prior Therapy: no  Authorization: medicare     Past Medical History:  Past Medical History  Diagnosis Date  . Paroxysmal atrial fibrillation     Palpitations; maintained on flecainide; -normal coronary angiography and 1999; negative stress nuclear study in 11/2005  . Gastroesophageal reflux disease   . Hearing loss     mild  . Tobacco abuse, in remission     10-pack-years; quit in 2001  . 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 Hyperlipidemia   . Ovarian cyst 2008  . PONV (postoperative nausea and vomiting)   . Arthritis   . Irregular heartbeat   . Gastroparesis    Past Surgical History:  Past Surgical History  Procedure Laterality Date  . Partial colectomy  2006    adenocarcinoma  . Abdominal hysterectomy  1983    partial  . Cosmetic surgery  09/2007  . Colonoscopy  03/25/2012    [name], MD  . Tonsillectomy    . Cholecystectomy    . Colon surgery  2006    Colon cancer    Subjective Symptoms/Limitations Symptoms: Recent flair up opf Rt low back pain that has improved. patient states "My primary complaint is  Pertinent History: Low back pain, started Decmber 7th  2014, patient's history of fall on Lt Leg resulting in pain in Lt knee, ankle/foot, and hip with nerve damage in foot/knee follwoign fall. Patient has had pain since decemebr 7 that is constant but is releived with pain medication. Patient had back surgery march 31st. to " clean up ruptured disc.  Limitations: Sitting;Walking;Standing How long can you sit  comfortably?: unable to without pain. How long can you stand comfortably?: unable How long can you walk comfortably?: unable Patient Stated Goals: to be bale to  Pain Assessment Currently in Pain?: Yes Pain Score: 7  Pain Location: Back Pain Orientation: Left;Mid;Lower Pain Type: Chronic pain Pain Onset: More than a month ago Pain Frequency: Constant Pain Relieving Factors: pain pill  Effect of Pain on Daily Activities: benidng, stooping, lifting, vaccuming.   Cognition/Observation Observation/Other Assessments Observations: Gait: trendelenber gait bilaterally, limited pelvic rotation and frontal plane sway, limited hip internal rotation and limited hip external rotation, limited calcaneal eversion/inversion.  Assessment RLE Strength Right Hip Flexion: 4/5 Right Knee Flexion: 4/5 Right Knee Extension: 4/5 Right Ankle Dorsiflexion: 4/5 LLE Strength Left Hip Flexion: 2+/5 Left Hip Extension: 4/5 Left Hip ABduction: 4/5 Left Knee Flexion: 3+/5 Left Knee Extension: 3+/5 Left Ankle Dorsiflexion: 2+/5 Lumbar AROM Lumbar Flexion: 65 exacerbates pain Lumbar Extension: 15 hurts neither worse Lumbar Strength Lumbar Flexion: 2/5 Lumbar Extension: 2/5  Exercise/Treatments Bent knee raises 10x  Physical Therapy Assessment and Plan PT Assessment and Plan Clinical Impression Statement: Patient displays lumbar spine pain secondary to bilateral LE weakness (Lt LE weaker tha Rt LE) with lumbar spine pain specifically attributed to limited abdominal strength resulting in decreased stability. Patient's other contributing factors included abnormal gait attributed to decreased LE strength resulting in decreased ability to control landing during gait. Patient will  benefit from skilled phsyical therapy to address the above limitations and return to regular walking and exercise program.  Pt will benefit from skilled therapeutic intervention in order to improve on the following deficits: Abnormal  gait;Decreased mobility;Increased fascial restricitons;Improper body mechanics;Decreased range of motion;Decreased activity tolerance;Improper spinal/pelvic alignment;Decreased balance;Decreased strength;Impaired flexibility;Difficulty walking Rehab Potential: Good PT Frequency: Min 2X/week PT Duration: 8 weeks PT Treatment/Interventions: Gait training;Stair training;Functional mobility training;Therapeutic exercise;Therapeutic activities;Balance training;Neuromuscular re-education;Modalities;Manual techniques;Patient/family education PT Plan: Initial focus on increased abdominal muslce strength and increrasing trunk stability. Next session will focus on stretches to be discharged to HEP so focus of therapy can remain on abdominal strengthening.     Goals Home Exercise Program Pt/caregiver will Perform Home Exercise Program: For increased strengthening PT Goal: Perform Home Exercise Program - Progress: Goal set today PT Short Term Goals Time to Complete Short Term Goals: 4 weeks PT Short Term Goal 1: Patient will demosntrate increased abdominal muscle strenght of 3+/5MMT to increase stability of lumbar spine so patient can stand for >minutes without pain >5/10. PT Short Term Goal 2: Patient will demosntrate a negative Pirifomris test to increase functional hip internal rotation to improve shock absorbtion during gait. PT Short Term Goal 3: Patient will demosntrate increased lumbar spine mobility to 90 degrees of flexion and 30 degrees of extension to be able to bend over to touch toes and tolook up at cieling without pain. PT Long Term Goals Time to Complete Long Term Goals: 8 weeks PT Long Term Goal 1: Patient will demonstrate increased abdominal muscle strenght of 4/5MMT to increase stability of lumbar spine so patient can stand for >59minutes without pain >3/10 PT Long Term Goal 2: Patient will demosntrate increased glut med/max strength to 4/5MMT to increase hip and knee stability to prevent  excessive strain on lumbar spine.   Problem List Patient Active Problem List   Diagnosis Date Noted  . Muscle weakness (generalized) 01/10/2014  . Difficulty in walking(719.7) 01/10/2014  . Lumbago 01/10/2014  . Sciatica of left side 04/26/2013  . Chest pain at rest 03/21/2013  . Prediabetes 12/30/2012  . Paroxysmal atrial fibrillation   . ADENOCARCINOMA, COLON 06/24/2009  . GASTROESOPHAGEAL REFLUX DISEASE 06/24/2009  . HYPERLIPIDEMIA 06/12/2009    PT - End of Session Activity Tolerance: Patient tolerated treatment well General Behavior During Therapy: WFL for tasks assessed/performed PT Plan of Care PT Home Exercise Plan: bent knee raises  GP Functional Assessment Tool Used: 60% limited Functional Limitation: Mobility: Walking and moving around Mobility: Walking and Moving Around Current Status (J1884): At least 60 percent but less than 80 percent impaired, limited or restricted Mobility: Walking and Moving Around Goal Status [phone]): At least 40 percent but less than 60 percent impaired, limited or restricted  Leia Alf 01/10/2014, 6:22 PM  Physician Documentation Your signature is required to indicate approval of the treatment plan as stated above.  Please sign and either send electronically or make a copy of this report for your files and return this physician signed original.   Please mark one 1.__approve of plan  2. ___approve of plan with the following conditions.   ______________________________                                                          _____________________ Physician Signature  Date  

## 2014-01-11 ENCOUNTER — Encounter: Payer: Self-pay | Admitting: Family Medicine

## 2014-01-11 DIAGNOSIS — M25569 Pain in unspecified knee: Secondary | ICD-10-CM | POA: Diagnosis not present

## 2014-01-17 ENCOUNTER — Other Ambulatory Visit: Payer: Self-pay | Admitting: Family Medicine

## 2014-01-18 ENCOUNTER — Ambulatory Visit (HOSPITAL_COMMUNITY): Payer: Medicare Other | Admitting: Physical Therapy

## 2014-01-18 DIAGNOSIS — Z5189 Encounter for other specified aftercare: Secondary | ICD-10-CM | POA: Diagnosis not present

## 2014-01-18 DIAGNOSIS — M25569 Pain in unspecified knee: Secondary | ICD-10-CM | POA: Diagnosis not present

## 2014-01-24 ENCOUNTER — Ambulatory Visit (HOSPITAL_COMMUNITY)
Admission: RE | Admit: 2014-01-24 | Discharge: 2014-01-24 | Disposition: A | Discharge status: Home / Self Care | Payer: Medicare Other | Source: Ambulatory Visit | Attending: Neurosurgery | Admitting: Neurosurgery

## 2014-01-24 DIAGNOSIS — IMO0001 Reserved for inherently not codable concepts without codable children: Secondary | ICD-10-CM | POA: Diagnosis not present

## 2014-01-24 NOTE — Evaluation (Signed)
Physical Therapy Evaluation  Patient Details  Name: [name] MRN: [id number] Date of Birth: 15-May-1940  Today's Date: 01/24/2014 Time: 1015-1100 PT Time Calculation (min): 45 min     Charges: 1 Evaluation, TE 1040-1100         Visit#: 2 of 16  Re-eval: 02/09/14 Assessment Diagnosis: ankle and knee pain secondary to Low back pain secondary to history of lumbar disc rupture and spondylosis Surgical Date: 08/08/13 Next MD Visit: October 6 [name] Prior Therapy: no  Authorization: medicare    Authorization Time Period:    Authorization Visit#:   of     Past Medical History:  Past Medical History  Diagnosis Date  . Paroxysmal atrial fibrillation     Palpitations; maintained on flecainide; -normal coronary angiography and 1999; negative stress nuclear study in 11/2005  . Gastroesophageal reflux disease   . Hearing loss     mild  . Tobacco abuse, in remission     10-pack-years; quit in 2001  . 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 Hyperlipidemia   . Ovarian cyst 2008  . PONV (postoperative nausea and vomiting)   . Arthritis   . Irregular heartbeat   . Gastroparesis    Past Surgical History:  Past Surgical History  Procedure Laterality Date  . Partial colectomy  2006    adenocarcinoma  . Abdominal hysterectomy  1983    partial  . Cosmetic surgery  09/2007  . Colonoscopy  03/25/2012    [name], MD  . Tonsillectomy    . Cholecystectomy    . Colon surgery  2006    Colon cancer    Subjective Symptoms/Limitations Symptoms: patient arrives with a new prescription today for treatment of her knee and ankle pain in addition to her low back pain. Patient states her knee and ankle are her primary complaint compaired to back pain. patient notes most pain with walking and weight bearing. Pertinent History: Low back pain, started Decmber 7th  2014, patient's history of fall on Lt Leg resulting in pain in Lt knee, ankle/foot, and hip with  nerve damage in foot/knee following fall. Patient has had pain since decemebr 7 that is constant but is releived with pain medication. Patient had back surgery march 31st. to " clean up ruptured disc. Patietnt's referrign MD evaluation revealed negative for any ligament ofr meniscal damage.  How long can you sit comfortably?: unable to without pain. How long can you stand comfortably?: unable without pain How long can you walk comfortably?: unable to withtou pain Patient Stated Goals: to be able to walk more.  Pain Assessment Currently in Pain?: Yes Pain Score: 9  (5-6  with pain medication ) Pain Location: Back Pain Orientation: Left;Mid;Lower Pain Type: Chronic pain Pain Onset: More than a month ago Pain Frequency: Constant Pain Relieving Factors: pain pill, biofreeze decreases knee pain Effect of Pain on Daily Activities: benidng, stooping, lifting, vaccuming  Precautions/Restrictions     Balance Screening    Prior Functioning     Cognition/Observation Observation/Other Assessments Observations: Gait: trendelenber gait bilaterally, limited pelvic rotation and frontal plane sway, limited hip internal rotation < hip external rotation, limited calcaneal eversion/inversion.  Sensation/Coordination/Flexibility/Functional Tests Flexibility Thomas: Positive Obers: Positive 90/90: Negative Functional Tests Functional Tests: Ely's test: negative Functional Tests: Piriformis test: negative  Assessment RLE Strength Right Hip Flexion: 4/5 Right Hip Extension: 4/5 Right Hip ABduction: 3+/5 Right Knee Flexion: 4/5 Right Knee Extension: 4/5 Right Ankle Dorsiflexion: 4/5 LLE AROM (  degrees) LLE Overall AROM Comments: pain around lateral maleuous with ankle enversion  Left Hip Extension: 15 Left Hip External Rotation : 40 Left Hip Internal Rotation : 33 Left Ankle Dorsiflexion: 10 Left Ankle Plantar Flexion: 60 Left Ankle Inversion: 40 Left Ankle Eversion: 35 LLE  Strength Left Hip Flexion: 2+/5 Left Hip Extension: 4/5 Left Hip ABduction: 3+/5 Left Knee Flexion: 3+/5 Left Knee Extension: 3+/5 Left Ankle Dorsiflexion: 2+/5 Left Ankle Plantar Flexion: 2-/5 Left Ankle Inversion: 4/5 Left Ankle Eversion: 2+/5 (painful) Lumbar AROM Lumbar Flexion: 65 exacerbates pain Lumbar Extension: 15 hurts neither worse Lumbar Strength Lumbar Flexion: 2/5 Lumbar Extension: 2/5  Foot mechanics: WNL  Exercise/Treatments Stretches ITB Stretch: 4 reps;20 seconds Standing Other Standing Lumbar Exercises: 3way calf stretch 30 seconds each Supine Bent Knee Raise: Limitations Bent Knee Raise Limitations: unilateral and bilateral 10x each   Physical Therapy Assessment and Plan PT Assessment and Plan Clinical Impression Statement: Evaluation performed this session with focus on patient's knee and ankle which she arrived with a prescription for therapy regarding this new primary complaint patient's ankle pain attributed to fall 8 months ago that resulted to twisting on Lt ankle and subsequent muslce strain of peroneal tendons and likely [place] tibial fibular ligament and will benefit from strengthening and streretchign of her ankle to decrease pain and improve stability while walking. Patient Lt knee pain attributed to limit ilitiotibial  band mobility secondary to decreased glut med/masx and hamstring strength resulting in decreased knee stability placing the ITband  under excess strain during standing and walking. Patient's low back pain conitnued to be attributed to limited hip strength and limited strength of lumbar spine stabilizers resultign in inabiltiy to control stresses paced on lumbar spine. Patient  witll benefit from skilled phsyoical therapy to address the above listed limited factors so patient can return to regular walking and exercise routines. Patient noted improved knee pain and decreased nausea follwoign ITband and calf stretches Pt will benefit from  skilled therapeutic intervention in order to improve on the following deficits: Abnormal gait;Decreased mobility;Increased fascial restricitons;Improper body mechanics;Decreased range of motion;Decreased activity tolerance;Improper spinal/pelvic alignment;Decreased balance;Decreased strength;Impaired flexibility;Difficulty walking;Pain Rehab Potential: Good PT Frequency: Min 2X/week PT Duration: 8 weeks PT Treatment/Interventions: Gait training;Stair training;Functional mobility training;Therapeutic exercise;Therapeutic activities;Balance training;Neuromuscular re-education;Modalities;Manual techniques;Patient/family education PT Plan: Initial focus on increased abdominal muslce strength and increasing trunk stability to address low back pain; Focus of knee on increasing glut med/max strength and ITband and illiopsoas mobility; Focus for ankle on increasing peroneal and gastroc mobility as well as total ankle strengtheing. Next session will focus on stretches to be discharged to HEP so focus of therapy can remain on abdominal strengthening.     Goals PT Short Term Goals PT Short Term Goal 1: Patient will demosntrate increased abdominal muscle strenght of 3+/5MMT to increase stability of lumbar spine so patient can stand for >minutes without pain >5/10. PT Short Term Goal 2: Patient will demosntrate a negative Pirifomris test to increase functional hip internal rotation to improve shock absorbtion during gait. PT Short Term Goal 3: Patient will demosntrate increased lumbar spine mobility to 90 degrees of flexion and 30 degrees of extension to be able to bend over to touch toes and tolook up at cieling without pain. PT Short Term Goal 4: Patient will increase ankle dorsiflexion to 15 degrees to improve ability to decelerate landing during gait. PT Short Term Goal 5: Patient will display a negative Obers test indicating improved lateral hip mobility PT Long Term Goals PT Long  Term Goal 1: Patient will  demonstrate increased abdominal muscle strenght of 4/5MMT to increase stability of lumbar spine so patient can stand for >63minutes without pain >3/10 PT Long Term Goal 2: Patient will demosntrate increased glut med/max strength to 4/5MMT to increase hip and knee stability to prevent excessive strain on lumbar spine.  Long Term Goal 3: Patient will increase ankle dorsiflexion to 20 degrees to improve ability to decelerate landing during gait. Long Term Goal 4: patient will demosntrate increased hip internal and external rotation to > 40 degrees indicating improved hip mobility PT Long Term Goal 5: patient will be able to stand and walk >20 minutes without pain > 2/10 in back, knee, or ankle.   Problem List Patient Active Problem List   Diagnosis Date Noted  . Muscle weakness (generalized) 01/10/2014  . Difficulty in walking(719.7) 01/10/2014  . Lumbago 01/10/2014  . Sciatica of left side 04/26/2013  . Chest pain at rest 03/21/2013  . Prediabetes 12/30/2012  . Paroxysmal atrial fibrillation   . ADENOCARCINOMA, COLON 06/24/2009  . GASTROESOPHAGEAL REFLUX DISEASE 06/24/2009  . HYPERLIPIDEMIA 06/12/2009    PT - End of Session Activity Tolerance: Patient tolerated treatment well General Behavior During Therapy: WFL for tasks assessed/performed  GP Functional Assessment Tool Used: 60% limited Functional Limitation: Mobility: Walking and moving around Mobility: Walking and Moving Around Current Status (E7209): At least 60 percent but less than 80 percent impaired, limited or restricted Mobility: Walking and Moving Around Goal Status [phone]): At least 40 percent but less than 60 percent impaired, limited or restricted  Leia Alf 01/24/2014, 11:16 AM  Physician Documentation Your signature is required to indicate approval of the treatment plan as stated above.  Please sign and either send electronically or make a copy of this report for your files and return this physician signed  original.   Please mark one 1.__approve of plan  2. ___approve of plan with the following conditions.   ______________________________                                                          _____________________ Physician Signature                                                                                                             Date

## 2014-01-26 ENCOUNTER — Ambulatory Visit (HOSPITAL_COMMUNITY)
Admission: RE | Admit: 2014-01-26 | Discharge: 2014-01-26 | Disposition: A | Discharge status: Home / Self Care | Payer: Medicare Other | Source: Ambulatory Visit | Attending: Family Medicine | Admitting: Family Medicine

## 2014-01-26 DIAGNOSIS — IMO0001 Reserved for inherently not codable concepts without codable children: Secondary | ICD-10-CM | POA: Diagnosis not present

## 2014-01-26 NOTE — Progress Notes (Signed)
Physical Therapy Treatment Patient Details  Name: [name] MRN: [id number] Date of Birth: 06-30-1940  Today's Date: 01/26/2014 Time: 1107-1150 PT Time Calculation (min): 43 min Charge: TE 2202-5427  Visit#: 3 of 16  Re-eval: 02/09/14 Assessment Diagnosis: ankle and knee pain secondary to Low back pain secondary to history of lumbar disc rupture and spondylosis Surgical Date: 08/08/13 Next MD Visit: October 6 [name] Prior Therapy: no  Authorization: medicare  Authorization Time Period:    Authorization Visit#:   of     Subjective: Symptoms/Limitations Symptoms: PT stated pain scale 9/10 with burning to Lt knee to foot.  No pain meds taken prior session today.   Pain Assessment Currently in Pain?: Yes Pain Score: 9  Pain Location: Leg Pain Orientation: Left  Objective:   Exercise/Treatments Stretches Active Hamstring Stretch: 3 reps;30 seconds;Limitations Active Hamstring Stretch Limitations: on chair 3 directions Hip Flexor Stretch: 2 reps;20 seconds;Limitations Hip Flexor Stretch Limitations: on chair hip and groin stretch Piriformis Stretch: 2 reps;30 seconds;Limitations Piriformis Stretch Limitations: seated Standing Other Standing Lumbar Exercises: 3way calf stretch 30 seconds each Supine Bent Knee Raise: Limitations Bent Knee Raise Limitations: unilateral and bilateral 10x5" each  Bridge: 10 reps;3 seconds Straight Leg Raise: 10 reps     Physical Therapy Assessment and Plan PT Assessment and Plan Clinical Impression Statement: Session focus on instructing stretches to continue as HEP to improve LE mobiltiy.  Pt given printouts and able to demonstrate appropraite form following cueing for form for maximum musculature stretching.  Progressed to abdominal strengthening exercises with tactile and verbal cueing for stabilt.  Pt limited by pain on Lt anterior ankle and up leg with weight bearing. PT Plan: Initial focus on increased abdominal muslce strength  and increasing trunk stability to address low back pain; Focus of knee on increasing glut med/max strength and ITband and illiopsoas mobility; Focus for ankle on increasing peroneal and gastroc mobility as well as total ankle strengtheing. Next session focus on abdominal strengthening. Begin clams and sidelying abduction next sessoin.      Goals PT Short Term Goals PT Short Term Goal 1: Patient will demosntrate increased abdominal muscle strenght of 3+/5MMT to increase stability of lumbar spine so patient can stand for >minutes without pain >5/10. PT Short Term Goal 1 - Progress: Progressing toward goal PT Short Term Goal 2: Patient will demosntrate a negative Pirifomris test to increase functional hip internal rotation to improve shock absorbtion during gait. PT Short Term Goal 2 - Progress: Progressing toward goal PT Short Term Goal 3: Patient will demosntrate increased lumbar spine mobility to 90 degrees of flexion and 30 degrees of extension to be able to bend over to touch toes and tolook up at cieling without pain. PT Short Term Goal 3 - Progress: Progressing toward goal PT Short Term Goal 4: Patient will increase ankle dorsiflexion to 15 degrees to improve ability to decelerate landing during gait. PT Short Term Goal 4 - Progress: Progressing toward goal PT Short Term Goal 5: Patient will display a negative Obers test indicating improved lateral hip mobility PT Short Term Goal 5 - Progress: Progressing toward goal PT Long Term Goals PT Long Term Goal 1: Patient will demonstrate increased abdominal muscle strenght of 4/5MMT to increase stability of lumbar spine so patient can stand for >36minutes without pain >3/10 PT Long Term Goal 2: Patient will demosntrate increased glut med/max strength to 4/5MMT to increase hip and knee stability to prevent excessive strain on lumbar spine.  Long Term Goal  3: Patient will increase ankle dorsiflexion to 20 degrees to improve ability to decelerate landing  during gait. Long Term Goal 4: patient will demosntrate increased hip internal and external rotation to > 40 degrees indicating improved hip mobility PT Long Term Goal 5: patient will be able to stand and walk >20 minutes without pain > 2/10 in back, knee, or ankle.   Problem List Patient Active Problem List   Diagnosis Date Noted  . Muscle weakness (generalized) 01/10/2014  . Difficulty in walking(719.7) 01/10/2014  . Lumbago 01/10/2014  . Sciatica of left side 04/26/2013  . Chest pain at rest 03/21/2013  . Prediabetes 12/30/2012  . Paroxysmal atrial fibrillation   . ADENOCARCINOMA, COLON 06/24/2009  . GASTROESOPHAGEAL REFLUX DISEASE 06/24/2009  . HYPERLIPIDEMIA 06/12/2009    PT - End of Session Activity Tolerance: Patient tolerated treatment well;Patient limited by pain General Behavior During Therapy: [facility] for tasks assessed/performed PT Plan of Care PT Home Exercise Plan: Given LE stretch printout  GP    Aldona Lento 01/26/2014, 4:14 PM

## 2014-01-31 ENCOUNTER — Ambulatory Visit (HOSPITAL_COMMUNITY)
Admission: RE | Admit: 2014-01-31 | Discharge: 2014-01-31 | Disposition: A | Discharge status: Home / Self Care | Payer: Medicare Other | Source: Ambulatory Visit | Attending: Neurosurgery | Admitting: Neurosurgery

## 2014-01-31 DIAGNOSIS — IMO0001 Reserved for inherently not codable concepts without codable children: Secondary | ICD-10-CM | POA: Diagnosis not present

## 2014-01-31 NOTE — Progress Notes (Signed)
Physical Therapy Treatment Patient Details  Name: [name] MRN: [id number] Date of Birth: 04/28/1941  Today's Date: 01/31/2014 Time: 1020-1100 PT Time Calculation (min): 40 min Charge: TE  1020-1100  Visit#: 4 of 16  Re-eval: 02/09/14 Assessment Diagnosis: ankle and knee pain secondary to Low back pain secondary to history of lumbar disc rupture and spondylosis Surgical Date: 08/08/13 Next MD Visit: October 6 [name] Prior Therapy: no  Authorization: medicare  Authorization Time Period:    Authorization Visit#:   of     Subjective: Symptoms/Limitations Symptoms: Pt reports compliance with stretches 2x dailly.  Pain scale 7/10 from Lt ankle to medial portion of knee.   Pain Assessment Currently in Pain?: Yes Pain Score: 7  Pain Location: Leg Pain Orientation: Distal;Mid  Objective:   Exercise/Treatments Stretches ITB Stretch: 2 reps;20 seconds;Limitations ITB Stretch Limitations: standing beside 6in step Standing Other Standing Lumbar Exercises: slant board gastroc stretch 3x 30" Supine Bent Knee Raise: Limitations Bent Knee Raise Limitations: bilateral 10x5" each  Bridge: 10 reps;3 seconds Straight Leg Raise: 10 reps;Limitations Straight Leg Raises Limitations: floating  Sidelying Clam: 5 reps;Limitations Clam Limitations: 10" holds with ab sets    Physical Therapy Assessment and Plan PT Assessment and Plan Clinical Impression Statement: Progressed core strenghtening this session with advanced  techniques with DKTC with cueing for lumbar stability and floating SLR.  Began sidelying exercises for gluteal med strengthening with good form demonstrated following education on positioning for correct  musculature activation.  Ended session with gastroc and ITBand stretches with noted improved gait mechanics.  Pt reported LE pain reduced. PT Plan: Initial focus on increased abdominal muslce strength and increasing trunk stability to address low back pain; Focus of  knee on increasing glut med/max strength and ITband and illiopsoas mobility; Focus for ankle on increasing peroneal and gastroc mobility as well as total ankle strengtheing. Next session focus on abdominal strengthening.  Begin 3D hip excursion next seseion for gluteal strenghtneing and hip mobility.      Goals PT Short Term Goals PT Short Term Goal 1: Patient will demosntrate increased abdominal muscle strenght of 3+/5MMT to increase stability of lumbar spine so patient can stand for >minutes without pain >5/10. PT Short Term Goal 1 - Progress: Progressing toward goal PT Short Term Goal 2: Patient will demosntrate a negative Pirifomris test to increase functional hip internal rotation to improve shock absorbtion during gait. PT Short Term Goal 3: Patient will demosntrate increased lumbar spine mobility to 90 degrees of flexion and 30 degrees of extension to be able to bend over to touch toes and tolook up at cieling without pain. PT Short Term Goal 3 - Progress: Progressing toward goal PT Short Term Goal 4: Patient will increase ankle dorsiflexion to 15 degrees to improve ability to decelerate landing during gait. PT Short Term Goal 4 - Progress: Progressing toward goal PT Short Term Goal 5: Patient will display a negative Obers test indicating improved lateral hip mobility PT Short Term Goal 5 - Progress: Progressing toward goal PT Long Term Goals PT Long Term Goal 1: Patient will demonstrate increased abdominal muscle strenght of 4/5MMT to increase stability of lumbar spine so patient can stand for >66minutes without pain >3/10 PT Long Term Goal 2: Patient will demosntrate increased glut med/max strength to 4/5MMT to increase hip and knee stability to prevent excessive strain on lumbar spine.  PT Long Term Goal 2 - Progress: Progressing toward goal Long Term Goal 3: Patient will increase ankle dorsiflexion to  20 degrees to improve ability to decelerate landing during gait. Long Term Goal 4:  patient will demosntrate increased hip internal and external rotation to > 40 degrees indicating improved hip mobility Long Term Goal 4 Progress: Progressing toward goal PT Long Term Goal 5: patient will be able to stand and walk >20 minutes without pain > 2/10 in back, knee, or ankle.   Problem List Patient Active Problem List   Diagnosis Date Noted  . Muscle weakness (generalized) 01/10/2014  . Difficulty in walking(719.7) 01/10/2014  . Lumbago 01/10/2014  . Sciatica of left side 04/26/2013  . Chest pain at rest 03/21/2013  . Prediabetes 12/30/2012  . Paroxysmal atrial fibrillation   . ADENOCARCINOMA, COLON 06/24/2009  . GASTROESOPHAGEAL REFLUX DISEASE 06/24/2009  . HYPERLIPIDEMIA 06/12/2009    PT - End of Session Activity Tolerance: Patient tolerated treatment well General Behavior During Therapy: [facility] for tasks assessed/performed  GP    Aldona Lento 01/31/2014, 11:04 AM

## 2014-02-02 ENCOUNTER — Ambulatory Visit (HOSPITAL_COMMUNITY)
Admission: RE | Admit: 2014-02-02 | Discharge: 2014-02-02 | Disposition: A | Discharge status: Home / Self Care | Payer: Medicare Other | Source: Ambulatory Visit | Attending: Neurosurgery | Admitting: Neurosurgery

## 2014-02-02 DIAGNOSIS — IMO0001 Reserved for inherently not codable concepts without codable children: Secondary | ICD-10-CM | POA: Diagnosis not present

## 2014-02-02 NOTE — Progress Notes (Signed)
Physical Therapy Treatment Patient Details  Name: [name] MRN: [id number] Date of Birth: [birth year]  Today's Date: 02/02/2014 Time: 1015-1100 PT Time Calculation (min): 45 min   Charges: Manual 1015-1025, TE 1025-1100  Visit#: 5 of 16  Re-eval: 02/09/14   Authorization: medicare   Subjective: Symptoms/Limitations Symptoms: Patinet notes minor pain in Lt thigh and hip  Pain Assessment Currently in Pain?: Yes Pain Score: 5  Pain Location: Leg Pain Orientation: Distal;Mid Pain Type: Chronic pain  Exercise/Treatments Stretches Quad Stretch: 3 reps;20 seconds ITB Stretch: 2 reps;20 seconds;Limitations ITB Stretch Limitations: standing beside 6in step Piriformis Stretch: 2 reps;30 seconds;Limitations Piriformis Stretch Limitations: seated Standing Other Standing Lumbar Exercises: slant board gastroc stretch 2x 30" (toes out and neutral Other Standing Lumbar Exercises: 3D hip excursions 10x, Red T-Band Sumo walk 69ft Supine Bent Knee Raise: Limitations Bent Knee Raise Limitations: bilateral 10x5" each, and sfloatign bent knee raise with straight leg lower 10x each.  Bridge: 20 reps Straight Leg Raise: 10 reps;Limitations Straight Leg Raises Limitations: floating  Quadruped Straight Leg Raise: 10 reps   Manual: myofascial release of lateral quad and ITBand Physical Therapy Assessment and Plan PT Assessment and Plan Clinical Impression Statement: Further progressed core setrengtheing this session with introduction of quadraped exercises. Remaining time spent improving LE mobility and strength to improve gait . Reported decrease pain in thigh follwoign quad stretch and myofascial release. Patient performed sumo walk for glut strengthening noting no pain.  PT Plan: Initial focus on increased abdominal muslce strength and increasing trunk stability to address low back pain; Focus of knee on increasing glut med/max strength and ITband and illiopsoas mobility; Focus for ankle on  increasing peroneal and gastroc mobility as well as total ankle strengtheing. Next session conitnue abdominal strengthening.  Begin 3D hip excursion in split stance next seseion for gluteal strenghtneing and hip mobility.  Begin squat training with introduction of squatting in neutral position with UE assist.     Goals PT Short Term Goals PT Short Term Goal 1: Patient will demosntrate increased abdominal muscle strenght of 3+/5MMT to increase stability of lumbar spine so patient can stand for >minutes without pain >5/10. PT Short Term Goal 1 - Progress: Progressing toward goal PT Short Term Goal 2: Patient will demosntrate a negative Pirifomris test to increase functional hip internal rotation to improve shock absorbtion during gait. PT Short Term Goal 2 - Progress: Progressing toward goal PT Short Term Goal 3: Patient will demosntrate increased lumbar spine mobility to 90 degrees of flexion and 30 degrees of extension to be able to bend over to touch toes and tolook up at cieling without pain. PT Short Term Goal 3 - Progress: Progressing toward goal PT Short Term Goal 4: Patient will increase ankle dorsiflexion to 15 degrees to improve ability to decelerate landing during gait. PT Short Term Goal 4 - Progress: Progressing toward goal PT Short Term Goal 5: Patient will display a negative Obers test indicating improved lateral hip mobility PT Short Term Goal 5 - Progress: Progressing toward goal  Problem List Patient Active Problem List   Diagnosis Date Noted  . Muscle weakness (generalized) 01/10/2014  . Difficulty in walking(719.7) 01/10/2014  . Lumbago 01/10/2014  . Sciatica of left side 04/26/2013  . Chest pain at rest 03/21/2013  . Prediabetes 12/30/2012  . Paroxysmal atrial fibrillation   . ADENOCARCINOMA, COLON 06/24/2009  . GASTROESOPHAGEAL REFLUX DISEASE 06/24/2009  . HYPERLIPIDEMIA 06/12/2009    PT - End of Session Activity Tolerance: Patient tolerated treatment  well General Behavior During Therapy: [facility] for tasks assessed/performed  GP    Serafin Decatur R 02/02/2014, 11:07 AM

## 2014-02-06 ENCOUNTER — Ambulatory Visit (HOSPITAL_COMMUNITY)
Admission: RE | Admit: 2014-02-06 | Discharge: 2014-02-06 | Disposition: A | Discharge status: Home / Self Care | Payer: Medicare Other | Source: Ambulatory Visit | Attending: Neurosurgery | Admitting: Neurosurgery

## 2014-02-06 DIAGNOSIS — IMO0001 Reserved for inherently not codable concepts without codable children: Secondary | ICD-10-CM | POA: Diagnosis not present

## 2014-02-06 NOTE — Progress Notes (Signed)
Physical Therapy Treatment Patient Details  Name: [name] MRN: [id number] Date of Birth: Apr 10, 1941  Today's Date: 02/06/2014 Time: 1430-1515 PT Time Calculation (min): 45 min Charge: there ex 3295-1884  Visit#: 6 of 16  Re-eval: 02/09/14    Subjective: Symptoms/Limitations Symptoms: Pt states that she had to take a pain pill prior to coming to therapy due to pain being at a 7-8/10  Pain Assessment Pain Score: 2  Pain Location: Leg  Exercise/Treatments       Stretches Active Hamstring Stretch: 3 reps;30 seconds;Limitations Active Hamstring Stretch Limitations: on 2nd step  Single Knee to Chest Stretch: 3 reps;30 seconds Prone on Elbows Stretch: 5 reps Quad Stretch: 30 seconds;2 reps Piriformis Stretch: 2 reps;30 seconds Piriformis Stretch Limitations: seated Standing Other Standing Lumbar Exercises: slant board gastroc stretch 2x 30" (toes out and neutral Other Standing Lumbar Exercises: 3D hip excursions x5 in split stance.;   Supine Ab Set: 10 reps Bent Knee Raise: 10 reps Bridge: 10 reps Sidelying Clam: 10 reps Prone  Straight Leg Raise: 10 reps Quadruped Straight Leg Raise: 5 reps    Physical Therapy Assessment and Plan PT Assessment and Plan Clinical Impression Statement: limited quadriped exercise secondary to knee pain in this position.  Began squatting with no hand hold as well as bent knee raise.  Pt needs verbal and tactile cuing to keep proper stabilization. PT Plan: Continue to focus on decreasing pain and improving mobility and strength.        Problem List Patient Active Problem List   Diagnosis Date Noted  . Muscle weakness (generalized) 01/10/2014  . Difficulty in walking(719.7) 01/10/2014  . Lumbago 01/10/2014  . Sciatica of left side 04/26/2013  . Chest pain at rest 03/21/2013  . Prediabetes 12/30/2012  . Paroxysmal atrial fibrillation   . ADENOCARCINOMA, COLON 06/24/2009  . GASTROESOPHAGEAL REFLUX DISEASE 06/24/2009  .  HYPERLIPIDEMIA 06/12/2009       GP    Uday Jantz,CINDY 02/06/2014, 5:36 PM

## 2014-02-08 ENCOUNTER — Ambulatory Visit (HOSPITAL_COMMUNITY)
Admission: RE | Admit: 2014-02-08 | Discharge: 2014-02-08 | Disposition: A | Discharge status: Home / Self Care | Payer: Medicare Other | Source: Ambulatory Visit | Attending: Family Medicine | Admitting: Family Medicine

## 2014-02-08 DIAGNOSIS — E785 Hyperlipidemia, unspecified: Secondary | ICD-10-CM | POA: Insufficient documentation

## 2014-02-08 DIAGNOSIS — Z87891 Personal history of nicotine dependence: Secondary | ICD-10-CM | POA: Insufficient documentation

## 2014-02-08 DIAGNOSIS — M6281 Muscle weakness (generalized): Secondary | ICD-10-CM | POA: Insufficient documentation

## 2014-02-08 DIAGNOSIS — K219 Gastro-esophageal reflux disease without esophagitis: Secondary | ICD-10-CM | POA: Insufficient documentation

## 2014-02-08 DIAGNOSIS — M25569 Pain in unspecified knee: Secondary | ICD-10-CM | POA: Diagnosis not present

## 2014-02-08 DIAGNOSIS — M545 Low back pain: Secondary | ICD-10-CM | POA: Diagnosis not present

## 2014-02-08 DIAGNOSIS — R262 Difficulty in walking, not elsewhere classified: Secondary | ICD-10-CM | POA: Diagnosis not present

## 2014-02-08 DIAGNOSIS — Z5189 Encounter for other specified aftercare: Secondary | ICD-10-CM | POA: Diagnosis not present

## 2014-02-08 NOTE — Evaluation (Signed)
Physical Therapy Reassessment  Patient Details  Name: [name] MRN: [id number] Date of Birth: [birth year]  Today's Date: 02/08/2014 Time: 7672-0947 PT Time Calculation (min): 42 min     Charge: TE 1743-1820, 1 MMT, 1 ROMM         Visit#: 7 of 16  Re-eval: 02/09/14 Assessment Diagnosis: ankle and knee pain secondary to Low back pain secondary to history of lumbar disc rupture and spondylosis Surgical Date: 08/08/13 Next MD Visit: October 6 [name] Prior Therapy: no  Authorization: medicare     Past Medical History:  Past Medical History  Diagnosis Date  . Paroxysmal atrial fibrillation     Palpitations; maintained on flecainide; -normal coronary angiography and 1999; negative stress nuclear study in 11/2005  . Gastroesophageal reflux disease   . Hearing loss     mild  . Tobacco abuse, in remission     10-pack-years; quit in 2001  . 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 Hyperlipidemia   . Ovarian cyst 2008  . PONV (postoperative nausea and vomiting)   . Arthritis   . Irregular heartbeat   . Gastroparesis    Past Surgical History:  Past Surgical History  Procedure Laterality Date  . Partial colectomy  2006    adenocarcinoma  . Abdominal hysterectomy  1983    partial  . Cosmetic surgery  09/2007  . Colonoscopy  03/25/2012    [name], MD  . Tonsillectomy    . Cholecystectomy    . Colon surgery  2006    Colon cancer    Subjective Symptoms/Limitations Symptoms: Patient states feeling good after the last few sessions. Notes that she has been feeling great after each session but after some tiem (which increases after each session) her pain returns.  Pain Assessment Currently in Pain?: Yes Pain Score: 3  Pain Location: Back Pain Orientation: Mid;Lower Pain Type: Chronic pain  Cognition/Observation Observation/Other Assessments Observations: Gait: trendelenber gait bilaterally, limited pelvic rotation and frontal plane sway,  limited hip internal rotation < hip external rotation, limited calcaneal eversion/inversion.  Sensation/Coordination/Flexibility/Functional Tests Flexibility Thomas: Positive Obers: Negative 90/90: Negative Functional Tests Functional Tests: Ely's test: negative Functional Tests: Piriformis test: negative  Assessment RLE Strength Right Hip Flexion: 4/5 Right Hip Extension: 4/5 Right Hip ABduction: 3+/5 Right Knee Flexion: 4/5 Right Knee Extension: 4/5 Right Ankle Dorsiflexion: 4/5 LLE AROM (degrees) Left Hip Extension: 15 Left Hip External Rotation : 45 Left Hip Internal Rotation : 40 Left Ankle Plantar Flexion: 60 Left Ankle Inversion: 40 Left Ankle Eversion: 35 LLE Strength Left Hip Flexion: 3+/5 Left Hip Extension: 4/5 Left Hip ABduction: 3+/5 Left Knee Flexion: 4/5 Left Knee Extension: 4/5 Left Ankle Dorsiflexion: 4/5 Left Ankle Plantar Flexion: 3+/5 Left Ankle Inversion: 4/5 Left Ankle Eversion: 3+/5 Lumbar AROM Lumbar Flexion: 90 degrees pain at end range.  Lumbar Extension: 30 degrees mo pain Lumbar Strength Lumbar Flexion: 2+/5 Lumbar Extension: 2+/5  Exercise/Treatments Stretches Active Hamstring Stretch: 3 reps;30 seconds;Limitations Active Hamstring Stretch Limitations: on 2nd step  Hip Flexor Stretch Limitations: Groin stretch to 14" 10x 3 seconds 2 ways Quad Stretch: 30 seconds;2 reps ITB Stretch: 2 reps;20 seconds;Limitations ITB Stretch Limitations: standing beside 6in step Piriformis Stretch: 2 reps;30 seconds Piriformis Stretch Limitations: seated Standing Other Standing Lumbar Exercises: 3D hip excursions x5 in split stance.; Supine Bent Knee Raise: Limitations Bent Knee Raise Limitations: bilateral 10x5" each, and sfloatign bent knee raise with straight leg lower 10x each.  Bridge: 20 reps  Straight Leg Raise: 10 reps;Limitations Straight Leg Raises Limitations: floating  Quadruped Plank: 3D UE weight shift. 10x each  Physical Therapy  Assessment and Plan PT Assessment and Plan Clinical Impression Statement: Patient making great progress towards all goals. Patient no longer has pain durign of following therapy until hours and sometimes days after therapy. Pain attributed to improvign though still limited strength in bilateral LE and limited core strength. Patient will continue to benefit from skilled phsyical therapy  with conitnued focus on strengthening  of abdominals and  and glut muscles to improve trendelenberg gait and improve ability to better tolerate high level lumbar spine stressing activities includign vaccuming which patient ntoes contine to bother her.  PT Plan: Recommending continuing skiled phsyical therapy to 2x a week for 4 more weeks with conitnued focus on strengthening of abdominal and hip abduction muscles.     Goals PT Short Term Goals PT Short Term Goal 1: Patient will demosntrate increased abdominal muscle strenght of 3+/5MMT to increase stability of lumbar spine so patient can stand for >minutes without pain >5/10. PT Short Term Goal 1 - Progress: Met PT Short Term Goal 2: Patient will demosntrate a negative Pirifomris test to increase functional hip internal rotation to improve shock absorbtion during gait. PT Short Term Goal 2 - Progress: Met PT Short Term Goal 3: Patient will demosntrate increased lumbar spine mobility to 90 degrees of flexion and 30 degrees of extension to be able to bend over to touch toes and tolook up at cieling without pain. PT Short Term Goal 3 - Progress: Met PT Short Term Goal 4: Patient will increase ankle dorsiflexion to 15 degrees to improve ability to decelerate landing during gait. PT Short Term Goal 4 - Progress: Progressing toward goal PT Long Term Goals PT Long Term Goal 1: Patient will demonstrate increased abdominal muscle strenght of 4/5MMT to increase stability of lumbar spine so patient can stand for >45mnutes without pain >3/10 PT Long Term Goal 1 - Progress:  Progressing toward goal PT Long Term Goal 2: Patient will demosntrate increased glut med/max strength to 4/5MMT to increase hip and knee stability to prevent excessive strain on lumbar spine.  PT Long Term Goal 2 - Progress: Progressing toward goal Long Term Goal 3: Patient will increase ankle dorsiflexion to 20 degrees to improve ability to decelerate landing during gait. Long Term Goal 4: patient will demosntrate increased hip internal and external rotation to > 40 degrees indicating improved hip mobility Long Term Goal 4 Progress: Progressing toward goal PT Long Term Goal 5: patient will be able to stand and walk >20 minutes without pain > 2/10 in back, knee, or ankle.  Long Term Goal 5 Progress: Progressing toward goal  Problem List Patient Active Problem List   Diagnosis Date Noted  . Muscle weakness (generalized) 01/10/2014  . Difficulty in walking(719.7) 01/10/2014  . Lumbago 01/10/2014  . Sciatica of left side 04/26/2013  . Chest pain at rest 03/21/2013  . Prediabetes 12/30/2012  . Paroxysmal atrial fibrillation   . ADENOCARCINOMA, COLON 06/24/2009  . GASTROESOPHAGEAL REFLUX DISEASE 06/24/2009  . HYPERLIPIDEMIA 06/12/2009    PT - End of Session Activity Tolerance: Patient tolerated treatment well General Behavior During Therapy: [facility] for tasks assessed/performed  GP Functional Assessment Tool Used: FOTO: 37% limited was 60% limited Functional Limitation: Mobility: Walking and moving around Mobility: Walking and Moving Around Current Status (([id number]: At least 20 percent but less than 40 percent impaired, limited or restricted Mobility: Walking and Moving Around Goal  Status [phone]): At least 1 percent but less than 20 percent impaired, limited or restricted  Leia Alf 02/08/2014, 6:44 PM  Physician Documentation Your signature is required to indicate approval of the treatment plan as stated above.  Please sign and either send electronically or make a copy of this report  for your files and return this physician signed original.   Please mark one 1.__approve of plan  2. ___approve of plan with the following conditions.   ______________________________                                                          _____________________ Physician Signature                                                                                                             Date

## 2014-02-13 ENCOUNTER — Ambulatory Visit (HOSPITAL_COMMUNITY)
Admission: RE | Admit: 2014-02-13 | Discharge: 2014-02-13 | Disposition: A | Discharge status: Home / Self Care | Payer: Medicare Other | Source: Ambulatory Visit | Attending: Family Medicine | Admitting: Family Medicine

## 2014-02-13 DIAGNOSIS — M25569 Pain in unspecified knee: Secondary | ICD-10-CM | POA: Diagnosis not present

## 2014-02-13 DIAGNOSIS — M47816 Spondylosis without myelopathy or radiculopathy, lumbar region: Secondary | ICD-10-CM | POA: Diagnosis not present

## 2014-02-13 DIAGNOSIS — R262 Difficulty in walking, not elsewhere classified: Secondary | ICD-10-CM | POA: Diagnosis not present

## 2014-02-13 DIAGNOSIS — M5126 Other intervertebral disc displacement, lumbar region: Secondary | ICD-10-CM | POA: Diagnosis not present

## 2014-02-13 DIAGNOSIS — M545 Low back pain: Secondary | ICD-10-CM | POA: Diagnosis not present

## 2014-02-13 DIAGNOSIS — Z87891 Personal history of nicotine dependence: Secondary | ICD-10-CM | POA: Diagnosis not present

## 2014-02-13 DIAGNOSIS — M6281 Muscle weakness (generalized): Secondary | ICD-10-CM | POA: Diagnosis not present

## 2014-02-13 DIAGNOSIS — Z5189 Encounter for other specified aftercare: Secondary | ICD-10-CM | POA: Diagnosis not present

## 2014-02-13 NOTE — Progress Notes (Signed)
Physical Therapy Treatment Patient Details  Name: [name] MRN: [id number] Date of Birth: [birth year]  Today's Date: 02/13/2014 Time: 1517-1600 PT Time Calculation (min): 43 min   Charges: TE 6789-3810 Visit#: 8 of 16  Re-eval: 02/09/14 Assessment Diagnosis: ankle and knee pain secondary to Low back pain secondary to history of lumbar disc rupture and spondylosis Surgical Date: 08/08/13 Next MD Visit: October 6 [name] Prior Therapy: no  Authorization: medicare   Subjective: Symptoms/Limitations Symptoms: Patient states feeling good and saw her mD yesterday noting he was pleased with her progress and recommending this phsyical therapist to him for other patients to see.  Pain Assessment Currently in Pain?: No/denies  Exercise/Treatments Standing Functional Squats: Limitations;10 reps (with UE support) Functional Squats Limitations: squat reach matrix with 3lb weight (no transverse plane 5x  Forward Lunge: 10 reps;Limitations Forward Lunge Limitations: 6" box with reach towards knee  Other Standing Lumbar Exercises: 3D hip excursions x5 in split stance. Quadruped Plank: UE and LE Ground matrix at raised table 5x   Physical Therapy Assessment and Plan PT Assessment and Plan Clinical Impression Statement: This session was limited due to new symptoms includign dizziness and fatigue. after discussing nutrition and dietwith patient it was revealed patient not not eaten anything for 6+ hours prior to therapy and she had not drink anything but coffee in the same time period. Patient demosntrated limited performance of all exercises and was unable to perform squatting with good depth. Session ended early so patient could get  eat and drink somehtign to decrease dizziness and light headed ness secondary to probable dehydration and hypo glycemia.  PT Plan: Conitnued focus on strengthening of abdominals to improve low back pain and stability adding UE ground matrix and LE ground matrix  when appropriate. Now focus the next 4 weeks on decreasing pain in Rt ankle and knee by increasing mobility and stability.     Goals    Problem List Patient Active Problem List   Diagnosis Date Noted  . Muscle weakness (generalized) 01/10/2014  . Difficulty in walking(719.7) 01/10/2014  . Lumbago 01/10/2014  . Sciatica of left side 04/26/2013  . Chest pain at rest 03/21/2013  . Prediabetes 12/30/2012  . Paroxysmal atrial fibrillation   . ADENOCARCINOMA, COLON 06/24/2009  . GASTROESOPHAGEAL REFLUX DISEASE 06/24/2009  . HYPERLIPIDEMIA 06/12/2009    PT - End of Session Activity Tolerance: Patient tolerated treatment well General Behavior During Therapy: [facility] for tasks assessed/performed  GP    Lasya Vetter R 02/13/2014, 5:27 PM

## 2014-02-15 ENCOUNTER — Ambulatory Visit (HOSPITAL_COMMUNITY): Payer: Medicare Other

## 2014-02-20 ENCOUNTER — Ambulatory Visit (HOSPITAL_COMMUNITY)
Admission: RE | Admit: 2014-02-20 | Discharge: 2014-02-20 | Disposition: A | Discharge status: Home / Self Care | Payer: Medicare Other | Source: Ambulatory Visit | Attending: Family Medicine | Admitting: Family Medicine

## 2014-02-20 DIAGNOSIS — M6281 Muscle weakness (generalized): Secondary | ICD-10-CM | POA: Diagnosis not present

## 2014-02-20 DIAGNOSIS — Z87891 Personal history of nicotine dependence: Secondary | ICD-10-CM | POA: Diagnosis not present

## 2014-02-20 DIAGNOSIS — Z5189 Encounter for other specified aftercare: Secondary | ICD-10-CM | POA: Diagnosis not present

## 2014-02-20 DIAGNOSIS — M25569 Pain in unspecified knee: Secondary | ICD-10-CM | POA: Diagnosis not present

## 2014-02-20 DIAGNOSIS — M545 Low back pain: Secondary | ICD-10-CM | POA: Diagnosis not present

## 2014-02-20 DIAGNOSIS — R262 Difficulty in walking, not elsewhere classified: Secondary | ICD-10-CM | POA: Diagnosis not present

## 2014-02-20 NOTE — Progress Notes (Signed)
Physical Therapy Treatment Patient Details  Name: [name] MRN: [id number] Date of Birth: [birth year]  Today's Date: 02/20/2014 Time: 1730-1815 PT Time Calculation (min): 45 min   Charges: TE 1730-40 and 2536-6440, Manual 1740-1750 Visit#: 9 of 16  Re-eval: 03/09/14 Assessment Diagnosis: ankle and knee pain secondary to Low back pain secondary to history of lumbar disc rupture and spondylosis Surgical Date: 08/08/13 Next MD Visit: October 6 [name] Prior Therapy: no  Authorization: medicare   Subjective: Symptoms/Limitations Symptoms: Patient states feeling good. Pain Assessment Currently in Pain?: Yes Pain Score: 3  Pain Location: Foot Pain Orientation: Left Pain Type: Chronic pain  Exercise/Treatments Stretches Active Hamstring Stretch: 3 reps;30 seconds;Limitations Active Hamstring Stretch Limitations: on 2nd step  Hip Flexor Stretch Limitations: Groin stretch to 14" 10x 3 seconds 2 ways Quad Stretch: 30 seconds;2 reps ITB Stretch: 2 reps;20 seconds;Limitations ITB Stretch Limitations: standing beside 6in step Piriformis Stretch: 2 reps;30 seconds Piriformis Stretch Limitations: seated Standing Functional Squats Limitations: Squat matrix Other Standing Lumbar Exercises: slant board 3 way gastroc stretch 60" (toes out and neutral Other Standing Lumbar Exercises: 3D hip excursions x5 in split stance. Quadruped Single Arm Raise: 10 reps Straight Leg Raise: 10 reps Opposite Arm/Leg Raise: 10 reps Plank: UE and LE Ground matrix at raised table 5x   Physical Therapy Assessment and Plan PT Assessment and Plan Clinical Impression Statement: This session, the therapist continued education on nutrition, and hydration as patient noted use of blood thinners. Patient noted improvement since last session with increase in water conssumption.This session contineud focus on abdominal and glut strengthening with patient noting no back/hip pain at end of session.  Patient's  foot was further assessed  and noted tenderness and mobility deficits in cuboid and talus though overall joint mobility appears within normal  limited.  P PT Plan: Conitnued focus on strengthening of abdominals to improve low back pain and stability adding LE ground matrix when appropriateContinue focu on decreasing pain in Rt ankle and knee by increasing mobility and stability as tolerated.     Goals PT Short Term Goals PT Short Term Goal 5: Patient will display a negative Obers test indicating improved lateral hip mobility PT Short Term Goal 5 - Progress: Progressing toward goal PT Long Term Goals PT Long Term Goal 1: Patient will demonstrate increased abdominal muscle strenght of 4/5MMT to increase stability of lumbar spine so patient can stand for >30minutes without pain >3/10 PT Long Term Goal 1 - Progress: Progressing toward goal PT Long Term Goal 2: Patient will demosntrate increased glut med/max strength to 4/5MMT to increase hip and knee stability to prevent excessive strain on lumbar spine.  PT Long Term Goal 2 - Progress: Progressing toward goal Long Term Goal 3: Patient will increase ankle dorsiflexion to 20 degrees to improve ability to decelerate landing during gait. Long Term Goal 3 Progress: Progressing toward goal Long Term Goal 4: patient will demosntrate increased hip internal and external rotation to > 40 degrees indicating improved hip mobility Long Term Goal 4 Progress: Progressing toward goal PT Long Term Goal 5: patient will be able to stand and walk >20 minutes without pain > 2/10 in back, knee, or ankle.  Long Term Goal 5 Progress: Progressing toward goal  Problem List Patient Active Problem List   Diagnosis Date Noted  . Muscle weakness (generalized) 01/10/2014  . Difficulty in walking(719.7) 01/10/2014  . Lumbago 01/10/2014  . Sciatica of left side 04/26/2013  . Chest pain at rest 03/21/2013  .  Prediabetes 12/30/2012  . Paroxysmal atrial fibrillation   .  ADENOCARCINOMA, COLON 06/24/2009  . GASTROESOPHAGEAL REFLUX DISEASE 06/24/2009  . HYPERLIPIDEMIA 06/12/2009   PT - End of Session Activity Tolerance: Patient tolerated treatment well General Behavior During Therapy: [facility] for tasks assessed/performed  Millie Forde R 02/20/2014, 6:41 PM

## 2014-02-22 ENCOUNTER — Ambulatory Visit (HOSPITAL_COMMUNITY)
Admission: RE | Admit: 2014-02-22 | Discharge: 2014-02-22 | Disposition: A | Discharge status: Home / Self Care | Payer: Medicare Other | Source: Ambulatory Visit | Attending: Physical Therapy | Admitting: Physical Therapy

## 2014-02-22 DIAGNOSIS — Z87891 Personal history of nicotine dependence: Secondary | ICD-10-CM | POA: Diagnosis not present

## 2014-02-22 DIAGNOSIS — M25569 Pain in unspecified knee: Secondary | ICD-10-CM | POA: Diagnosis not present

## 2014-02-22 DIAGNOSIS — M545 Low back pain: Secondary | ICD-10-CM | POA: Diagnosis not present

## 2014-02-22 DIAGNOSIS — M6281 Muscle weakness (generalized): Secondary | ICD-10-CM | POA: Diagnosis not present

## 2014-02-22 DIAGNOSIS — Z5189 Encounter for other specified aftercare: Secondary | ICD-10-CM | POA: Diagnosis not present

## 2014-02-22 DIAGNOSIS — R262 Difficulty in walking, not elsewhere classified: Secondary | ICD-10-CM | POA: Diagnosis not present

## 2014-02-22 NOTE — Progress Notes (Signed)
Physical Therapy Treatment Patient Details  Name: [name] MRN: [id number] Date of Birth: 03-18-1941  Today's Date: 02/22/2014 Time: 2774-1287 PT Time Calculation (min): 40 min TE 8676-7209  Visit#: 10 of 16  Re-eval: 03/09/14    Authorization: medicare   Subjective: Symptoms/Limitations Symptoms: Pt reports only mild complaints of pain today on the Lt LE, though pt does report she took a pain pill at 11 am this morning.  Pain Assessment Currently in Pain?: Yes Pain Score: 3  Pain Location: Leg Pain Orientation: Left Pain Type: Chronic pain   Exercise/Treatments Stretches Active Hamstring Stretch: 3 reps;30 seconds;Limitations Active Hamstring Stretch Limitations: 14" Box Prone on Elbows Stretch: 3 reps;10 seconds Quad Stretch: 30 seconds;2 reps;Limitations Quad Stretch Limitations: Prone with rope Piriformis Stretch: 2 reps;30 seconds Piriformis Stretch Limitations: seated Standing Other Standing Lumbar Exercises: slant board 3 way gastroc stretch 60" (toes out and neutral Other Standing Lumbar Exercises: 3D hip excursions x10 in split stance. Prone  Straight Leg Raise: 20 reps (2x10) Quadruped Straight Leg Raise: 10 reps Opposite Arm/Leg Raise: 10 reps;Right arm/Left leg;Left arm/Right leg     Physical Therapy Assessment and Plan PT Assessment and Plan Clinical Impression Statement: Continued focus on functional mobility and strengthening program.  Pt had difficulty with bird-dog exercise with Rt hip extension/Lt UE extension requiring close SBA/CGA to maintain balance, possibly related to trunk weakness or pt reports of difficulty maintaining balance of Lt knee since fall.  Mild complaints of pain with WB on Lt knee during exercise; educated pt to assess if pain increases after therapy session.   Pt will benefit from skilled therapeutic intervention in order to improve on the following deficits: Abnormal gait;Decreased mobility;Increased fascial  restricitons;Improper body mechanics;Decreased range of motion;Decreased activity tolerance;Improper spinal/pelvic alignment;Decreased balance;Decreased strength;Impaired flexibility;Difficulty walking;Pain PT Plan: Conitnued focus on strengthening of abdominals to improve low back pain and stability adding LE ground matrix when appropriate.     Goals PT Long Term Goals PT Long Term Goal 1: Patient will demonstrate increased abdominal muscle strenght of 4/5MMT to increase stability of lumbar spine so patient can stand for >18minutes without pain >3/10 PT Long Term Goal 1 - Progress: Progressing toward goal PT Long Term Goal 2: Patient will demosntrate increased glut med/max strength to 4/5MMT to increase hip and knee stability to prevent excessive strain on lumbar spine.  PT Long Term Goal 2 - Progress: Progressing toward goal Long Term Goal 3: Patient will increase ankle dorsiflexion to 20 degrees to improve ability to decelerate landing during gait. Long Term Goal 3 Progress: Progressing toward goal  Problem List Patient Active Problem List   Diagnosis Date Noted  . Muscle weakness (generalized) 01/10/2014  . Difficulty in walking(719.7) 01/10/2014  . Lumbago 01/10/2014  . Sciatica of left side 04/26/2013  . Chest pain at rest 03/21/2013  . Prediabetes 12/30/2012  . Paroxysmal atrial fibrillation   . ADENOCARCINOMA, COLON 06/24/2009  . GASTROESOPHAGEAL REFLUX DISEASE 06/24/2009  . HYPERLIPIDEMIA 06/12/2009    PT - End of Session Activity Tolerance: Patient tolerated treatment well General Behavior During Therapy: [facility] for tasks assessed/performed   Niquan Charnley 02/22/2014, 1:54 PM

## 2014-02-27 ENCOUNTER — Ambulatory Visit (HOSPITAL_COMMUNITY)
Admission: RE | Admit: 2014-02-27 | Discharge: 2014-02-27 | Disposition: A | Discharge status: Home / Self Care | Payer: Medicare Other | Source: Ambulatory Visit | Attending: Family Medicine | Admitting: Family Medicine

## 2014-02-27 DIAGNOSIS — Z87891 Personal history of nicotine dependence: Secondary | ICD-10-CM | POA: Diagnosis not present

## 2014-02-27 DIAGNOSIS — Z5189 Encounter for other specified aftercare: Secondary | ICD-10-CM | POA: Diagnosis not present

## 2014-02-27 DIAGNOSIS — R262 Difficulty in walking, not elsewhere classified: Secondary | ICD-10-CM | POA: Diagnosis not present

## 2014-02-27 DIAGNOSIS — M545 Low back pain: Secondary | ICD-10-CM | POA: Diagnosis not present

## 2014-02-27 DIAGNOSIS — M25569 Pain in unspecified knee: Secondary | ICD-10-CM | POA: Diagnosis not present

## 2014-02-27 DIAGNOSIS — M6281 Muscle weakness (generalized): Secondary | ICD-10-CM | POA: Diagnosis not present

## 2014-02-27 NOTE — Progress Notes (Signed)
Physical Therapy Treatment Patient Details  Name: [name] MRN: [id number] Date of Birth: 12/20/1940  Today's Date: 02/27/2014 Time: 1018-1100 PT Time Calculation (min): 42 min   Charges: Manual therapy 1018-1030, TE 1030-1100 Visit#: 12 of 16  Re-eval: 03/09/14 Assessment Diagnosis: ankle and knee pain secondary to Low back pain secondary to history of lumbar disc rupture and spondylosis Surgical Date: 08/08/13 Next MD Visit: October 6 [name] Prior Therapy: no  Authorization: medicare  Authorization Time Period:    Authorization Visit#:   of     Subjective: Symptoms/Limitations Symptoms: Patient states she is feeling better but notes that her back is her primary complaint today.  Pain Assessment Currently in Pain?: Yes Pain Score: 4  Pain Location: Back Pain Orientation: Left Pain Type: Chronic pain  Precautions/Restrictions     Exercise/Treatments Stretches Active Hamstring Stretch: 3 reps;30 seconds;Limitations Active Hamstring Stretch Limitations: 14" Box Prone on Elbows Stretch: 3 reps;10 seconds Quad Stretch: 30 seconds;2 reps;Limitations Quad Stretch Limitations: Prone with rope Piriformis Stretch: 2 reps;30 seconds Piriformis Stretch Limitations: seated Standing Functional Squats Limitations: Squat reach matrix 3x with 3lb dumbbell Other Standing Lumbar Exercises: slant board 3 way gastroc stretch 60" (toes out and neutral Other Standing Lumbar Exercises: 3D hip excursions x10 in split stance. Supine Bent Knee Raise: Limitations Bent Knee Raise Limitations: bilateral 10x5" each, and floatign bent knee raise with straight leg lower 10x each.  Bridge: 20 reps;Limitations Bridge Limitations: pain noted during single leg bidges.  Straight Leg Raise: 10 reps;Limitations Straight Leg Raises Limitations: floating  Quadruped Straight Leg Raise: 10 reps Opposite Arm/Leg Raise: 10 reps;Right arm/Left leg;Left arm/Right leg Other Quadruped Lumbar  Exercises: Fire hydrants 10x  Manual Therapy Manual Therapy: Joint mobilization Joint Mobilization: Rt L3, Lt L2 unilateral joint mobilizations grade 2, and 3, non-painful, soft tixxue mobilization of lumbar paraspinal muscle.  MET to correct hip alignment  Physical Therapy Assessment and Plan PT Assessment and Plan Clinical Impression Statement: Patient arrived with noted increase4 in back pain this session. Hip alignment assessed with Lt hip posterioprly tilted excessively, followign mEt to correct patiernt noted decreased back pain, following foint mobilization of Lumbar spine patient noted further decrease in pain. Patient demosntrated improved gait at end of session with decreased trendelenberg sign following hip and and strunk stabilization and strengtheing exercises.  Patient stated feeling much better at end of session.  PT Plan: Conitnued focus on strengthening of abdominals to improve low back pain and stability    Goals PT Short Term Goals PT Short Term Goal 4: Patient will increase ankle dorsiflexion to 15 degrees to improve ability to decelerate landing during gait. PT Short Term Goal 4 - Progress: Progressing toward goal PT Short Term Goal 5: Patient will display a negative Obers test indicating improved lateral hip mobility PT Short Term Goal 5 - Progress: Progressing toward goal PT Long Term Goals PT Long Term Goal 1: Patient will demonstrate increased abdominal muscle strenght of 4/5MMT to increase stability of lumbar spine so patient can stand for >80mnutes without pain >3/10 PT Long Term Goal 1 - Progress: Progressing toward goal PT Long Term Goal 2: Patient will demosntrate increased glut med/max strength to 4/5MMT to increase hip and knee stability to prevent excessive strain on lumbar spine.  PT Long Term Goal 2 - Progress: Progressing toward goal Long Term Goal 3: Patient will increase ankle dorsiflexion to 20 degrees to improve ability to decelerate landing during  gait. Long Term Goal 3 Progress: Progressing toward goal Long  Term Goal 4: patient will demosntrate increased hip internal and external rotation to > 40 degrees indicating improved hip mobility Long Term Goal 4 Progress: Progressing toward goal PT Long Term Goal 5: patient will be able to stand and walk >20 minutes without pain > 2/10 in back, knee, or ankle.  Long Term Goal 5 Progress: Progressing toward goal  Problem List Patient Active Problem List   Diagnosis Date Noted  . Muscle weakness (generalized) 01/10/2014  . Difficulty in walking(719.7) 01/10/2014  . Lumbago 01/10/2014  . Sciatica of left side 04/26/2013  . Chest pain at rest 03/21/2013  . Prediabetes 12/30/2012  . Paroxysmal atrial fibrillation   . ADENOCARCINOMA, COLON 06/24/2009  . GASTROESOPHAGEAL REFLUX DISEASE 06/24/2009  . HYPERLIPIDEMIA 06/12/2009    PT - End of Session Activity Tolerance: Patient tolerated treatment well General Behavior During Therapy: [facility] for tasks assessed/performed  GP    Kerina Simoneau R 02/27/2014, 11:03 AM

## 2014-03-02 ENCOUNTER — Ambulatory Visit (HOSPITAL_COMMUNITY)
Admission: RE | Admit: 2014-03-02 | Discharge: 2014-03-02 | Disposition: A | Discharge status: Home / Self Care | Payer: Medicare Other | Source: Ambulatory Visit | Attending: Neurosurgery | Admitting: Neurosurgery

## 2014-03-02 DIAGNOSIS — R262 Difficulty in walking, not elsewhere classified: Secondary | ICD-10-CM | POA: Diagnosis not present

## 2014-03-02 DIAGNOSIS — M25569 Pain in unspecified knee: Secondary | ICD-10-CM | POA: Diagnosis not present

## 2014-03-02 DIAGNOSIS — Z87891 Personal history of nicotine dependence: Secondary | ICD-10-CM | POA: Diagnosis not present

## 2014-03-02 DIAGNOSIS — M6281 Muscle weakness (generalized): Secondary | ICD-10-CM | POA: Diagnosis not present

## 2014-03-02 DIAGNOSIS — M545 Low back pain: Secondary | ICD-10-CM | POA: Diagnosis not present

## 2014-03-02 DIAGNOSIS — Z5189 Encounter for other specified aftercare: Secondary | ICD-10-CM | POA: Diagnosis not present

## 2014-03-02 NOTE — Progress Notes (Signed)
Physical Therapy Treatment Patient Details  Name: [name] MRN: [id number] Date of Birth: 04/22/1941  Today's Date: 03/02/2014 Time: 1515-1600 PT Time Calculation (min): 45 min   Charges: 6283-1517 TE Visit#: 13 of 16  Re-eval: 03/09/14 Assessment Diagnosis: ankle and knee pain secondary to Low back pain secondary to history of lumbar disc rupture and spondylosis Surgical Date: 08/08/13 Next MD Visit: October 6 [name] Prior Therapy: no  Authorization: medicare   Subjective: Symptoms/Limitations Symptoms: Patient notes she is feeling more dizzy today. states she wants to see her doctor about the blood thinners as she has been more dizzy for the first time in her life since going on it.  Pain Assessment Currently in Pain?: No/denies Pain Score: 0-No pain  Exercise/Treatments Stretches Active Hamstring Stretch: 3 reps;30 seconds;Limitations Active Hamstring Stretch Limitations: 14" Box Prone on Elbows Stretch: 3 reps;10 seconds Quad Stretch: 30 seconds;2 reps;Limitations Quad Stretch Limitations: Prone with rope Piriformis Stretch: 2 reps;30 seconds Piriformis Stretch Limitations: seated Standing Functional Squats Limitations: Squat reach matrix 3x with 3lb dumbbell Supine Bridge: 20 reps;Limitations Bridge Limitations: pain noted during single leg bidges.  Quadruped Straight Leg Raise: 10 reps Opposite Arm/Leg Raise: 10 reps;Right arm/Left leg;Left arm/Right leg Plank: 5x 10 seconds  Physical Therapy Assessment and Plan PT Assessment and Plan Clinical Impression Statement: Patient noting uincreased fatigue resultign in limited exercise performance as well as Rt hand/wrist pain. Patient demonstrated good performance of all exercises though she had increase low back pain with flexion based exercises. and decreased pain following lumbar spine extension stabilization beased exercises.   PT Plan: Conitnued focus on strengthening of abdominals and lumbar extension  stabilization to improve low back pain and stability. Conitnue planking and quadraped based exercises. progress to standing per patient tolerance of dizziness.     Goals PT Short Term Goals PT Short Term Goal 4: Patient will increase ankle dorsiflexion to 15 degrees to improve ability to decelerate landing during gait. PT Short Term Goal 4 - Progress: Progressing toward goal PT Short Term Goal 5: Patient will display a negative Obers test indicating improved lateral hip mobility PT Short Term Goal 5 - Progress: Progressing toward goal PT Long Term Goals PT Long Term Goal 1: Patient will demonstrate increased abdominal muscle strenght of 4/5MMT to increase stability of lumbar spine so patient can stand for >43minutes without pain >3/10 PT Long Term Goal 1 - Progress: Progressing toward goal PT Long Term Goal 2: Patient will demosntrate increased glut med/max strength to 4/5MMT to increase hip and knee stability to prevent excessive strain on lumbar spine.  PT Long Term Goal 2 - Progress: Progressing toward goal Long Term Goal 3: Patient will increase ankle dorsiflexion to 20 degrees to improve ability to decelerate landing during gait. Long Term Goal 3 Progress: Progressing toward goal Long Term Goal 4: patient will demosntrate increased hip internal and external rotation to > 40 degrees indicating improved hip mobility Long Term Goal 4 Progress: Progressing toward goal PT Long Term Goal 5: patient will be able to stand and walk >20 minutes without pain > 2/10 in back, knee, or ankle.  Long Term Goal 5 Progress: Progressing toward goal  Problem List Patient Active Problem List   Diagnosis Date Noted  . Muscle weakness (generalized) 01/10/2014  . Difficulty in walking(719.7) 01/10/2014  . Lumbago 01/10/2014  . Sciatica of left side 04/26/2013  . Chest pain at rest 03/21/2013  . Prediabetes 12/30/2012  . Paroxysmal atrial fibrillation   . ADENOCARCINOMA, COLON 06/24/2009  .  GASTROESOPHAGEAL REFLUX DISEASE 06/24/2009  . HYPERLIPIDEMIA 06/12/2009    PT - End of Session Activity Tolerance: Patient tolerated treatment well General Behavior During Therapy: [facility] for tasks assessed/performed  GP    Ell Tiso R 03/02/2014, 4:13 PM

## 2014-03-05 ENCOUNTER — Ambulatory Visit (HOSPITAL_COMMUNITY)
Admission: RE | Admit: 2014-03-05 | Discharge: 2014-03-05 | Disposition: A | Discharge status: Home / Self Care | Payer: Medicare Other | Source: Ambulatory Visit | Attending: Family Medicine | Admitting: Family Medicine

## 2014-03-05 DIAGNOSIS — M25569 Pain in unspecified knee: Secondary | ICD-10-CM | POA: Diagnosis not present

## 2014-03-05 DIAGNOSIS — M545 Low back pain: Secondary | ICD-10-CM | POA: Diagnosis not present

## 2014-03-05 DIAGNOSIS — Z87891 Personal history of nicotine dependence: Secondary | ICD-10-CM | POA: Diagnosis not present

## 2014-03-05 DIAGNOSIS — Z5189 Encounter for other specified aftercare: Secondary | ICD-10-CM | POA: Diagnosis not present

## 2014-03-05 DIAGNOSIS — M6281 Muscle weakness (generalized): Secondary | ICD-10-CM | POA: Diagnosis not present

## 2014-03-05 DIAGNOSIS — R262 Difficulty in walking, not elsewhere classified: Secondary | ICD-10-CM | POA: Diagnosis not present

## 2014-03-05 NOTE — Progress Notes (Signed)
Physical Therapy Treatment Patient Details  Name: [name] MRN: [id number] Date of Birth: 05/13/1940  Today's Date: 03/05/2014 Time: 8841-6606 PT Time Calculation (min): 75 min ME 1348-1351, TE 3016-0109  Visit#: 14 of 16  Re-eval: 03/09/14     Subjective: Symptoms/Limitations Symptoms: Pt reports increased complaints of pain to 8-9/10 upon waking this morning and took a hydrocodone at ~11:30; pain currently at 1-2/10.  Pain Assessment Currently in Pain?: Yes Pain Score: 2  Pain Location: Back Pain Orientation: Left Pain Type: Chronic pain   Exercise/Treatments Stretches Active Hamstring Stretch: 3 reps;30 seconds;Limitations Active Hamstring Stretch Limitations: 14" Box Passive Hamstring Stretch: 3 reps;30 seconds;Limitations Passive Hamstring Stretch Limitations: Press photographer, Slantboard Prone on Elbows Stretch: 3 reps;10 seconds Quad Stretch: 3 reps;30 seconds Quad Stretch Limitations: Prone with rope Piriformis Stretch: 2 reps;30 seconds Piriformis Stretch Limitations: Supine Standing Other Standing Lumbar Exercises: 3D Hip Excursion Supine Straight Leg Raise: 15 reps Straight Leg Raises Limitations: floating  Other Supine Lumbar Exercises: Tabletop Toe Taps x10 Prone  Straight Leg Raise: 15 reps Quadruped Opposite Arm/Leg Raise: 15 reps Plank: 5x 10 seconds  Manual Therapy Manual Therapy: Joint mobilization Joint Mobilization: MET Lt innominate for posterior rotation, hip ABD/ADD MET for hip stability  Physical Therapy Assessment and Plan PT Assessment and Plan Clinical Impression Statement: Pt reports mild complaints of pain today, though she reports she did take a pain pill today.  pt reports she feels like she is improving, and only not has some "bad days".  Pt was able to complete exercises today with good technique, verbalizing importance of abdominal contraction prior to core stabilization exercise to protect back and increase ab strength.   Progressed ab strengthening exercises with increased reps today, and increased lower abdominal work.  Noted shaking of abdominal musculature during plank exercise, though pt was able to achieve plank position with good technique.  Educated pt to assess functional mobility prior to next visit (stand, sit, amb time) for re-assessment.   Pt will benefit from skilled therapeutic intervention in order to improve on the following deficits: Abnormal gait;Decreased mobility;Increased fascial restricitons;Improper body mechanics;Decreased range of motion;Decreased activity tolerance;Improper spinal/pelvic alignment;Decreased balance;Decreased strength;Impaired flexibility;Difficulty walking;Pain PT Plan: Re-Eval next visit for possible d/c to (I) HEP.      Goals PT Short Term Goals PT Short Term Goal 4: Patient will increase ankle dorsiflexion to 15 degrees to improve ability to decelerate landing during gait. PT Short Term Goal 4 - Progress: Progressing toward goal PT Long Term Goals PT Long Term Goal 1: Patient will demonstrate increased abdominal muscle strenght of 4/5MMT to increase stability of lumbar spine so patient can stand for >37mnutes without pain >3/10 PT Long Term Goal 1 - Progress: Progressing toward goal PT Long Term Goal 2: Patient will demosntrate increased glut med/max strength to 4/5MMT to increase hip and knee stability to prevent excessive strain on lumbar spine.  PT Long Term Goal 2 - Progress: Progressing toward goal Long Term Goal 3: Patient will increase ankle dorsiflexion to 20 degrees to improve ability to decelerate landing during gait. Long Term Goal 3 Progress: Progressing toward goal Long Term Goal 4: patient will demosntrate increased hip internal and external rotation to > 40 degrees indicating improved hip mobility Long Term Goal 4 Progress: Progressing toward goal  Problem List Patient Active Problem List   Diagnosis Date Noted  . Muscle weakness (generalized)  01/10/2014  . Difficulty in walking(719.7) 01/10/2014  . Lumbago 01/10/2014  . Sciatica of left side 04/26/2013  .  Chest pain at rest 03/21/2013  . Prediabetes 12/30/2012  . Paroxysmal atrial fibrillation   . ADENOCARCINOMA, COLON 06/24/2009  . GASTROESOPHAGEAL REFLUX DISEASE 06/24/2009  . HYPERLIPIDEMIA 06/12/2009    PT - End of Session Activity Tolerance: Patient tolerated treatment well General Behavior During Therapy: [facility] for tasks assessed/performed   [name] 03/05/2014, 2:37 PM

## 2014-03-06 ENCOUNTER — Ambulatory Visit (HOSPITAL_COMMUNITY): Payer: Medicare Other | Admitting: Physical Therapy

## 2014-03-13 ENCOUNTER — Ambulatory Visit (HOSPITAL_COMMUNITY)
Admission: RE | Admit: 2014-03-13 | Discharge: 2014-03-13 | Disposition: A | Discharge status: Home / Self Care | Payer: Medicare Other | Source: Ambulatory Visit | Attending: Family Medicine | Admitting: Family Medicine

## 2014-03-13 DIAGNOSIS — M6281 Muscle weakness (generalized): Secondary | ICD-10-CM

## 2014-03-13 DIAGNOSIS — R262 Difficulty in walking, not elsewhere classified: Secondary | ICD-10-CM | POA: Insufficient documentation

## 2014-03-13 DIAGNOSIS — M25562 Pain in left knee: Secondary | ICD-10-CM

## 2014-03-13 DIAGNOSIS — M25579 Pain in unspecified ankle and joints of unspecified foot: Secondary | ICD-10-CM | POA: Insufficient documentation

## 2014-03-13 DIAGNOSIS — M545 Low back pain: Secondary | ICD-10-CM

## 2014-03-13 DIAGNOSIS — Z87891 Personal history of nicotine dependence: Secondary | ICD-10-CM | POA: Insufficient documentation

## 2014-03-13 DIAGNOSIS — Z5189 Encounter for other specified aftercare: Secondary | ICD-10-CM | POA: Diagnosis not present

## 2014-03-13 DIAGNOSIS — K219 Gastro-esophageal reflux disease without esophagitis: Secondary | ICD-10-CM | POA: Insufficient documentation

## 2014-03-13 DIAGNOSIS — M25572 Pain in left ankle and joints of left foot: Secondary | ICD-10-CM

## 2014-03-13 DIAGNOSIS — M25569 Pain in unspecified knee: Secondary | ICD-10-CM | POA: Insufficient documentation

## 2014-03-13 DIAGNOSIS — E785 Hyperlipidemia, unspecified: Secondary | ICD-10-CM | POA: Diagnosis not present

## 2014-03-13 HISTORY — DX: Pain in unspecified ankle and joints of unspecified foot: M25.579

## 2014-03-13 NOTE — Therapy (Signed)
Physical Therapy Evaluation  Patient Details  Name: [name] MRN: [id number] Date of Birth: February 15, 1941  Encounter Date: 03/13/2014      PT End of Session - 03/13/14 1438    Visit Number 15   Number of Visits 26   Date for PT Re-Evaluation 04/12/14   Authorization Type Medicare   Authorization - Visit Number 15   PT Start Time 5631   PT Stop Time 4970   PT Time Calculation (min) 44 min   Activity Tolerance Patient tolerated treatment well      Past Medical History  Diagnosis Date  . Paroxysmal atrial fibrillation     Palpitations; maintained on flecainide; -normal coronary angiography and 1999; negative stress nuclear study in 11/2005  . Gastroesophageal reflux disease   . Hearing loss     mild  . Tobacco abuse, in remission     10-pack-years; quit in 2001  . 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 Hyperlipidemia   . Ovarian cyst 2008  . PONV (postoperative nausea and vomiting)   . Arthritis   . Irregular heartbeat   . Gastroparesis     Past Surgical History  Procedure Laterality Date  . Partial colectomy  2006    adenocarcinoma  . Abdominal hysterectomy  1983    partial  . Cosmetic surgery  09/2007  . Colonoscopy  03/25/2012    [name], MD  . Tonsillectomy    . Cholecystectomy    . Colon surgery  2006    Colon cancer    There were no vitals taken for this visit.  Visit Diagnosis:  Muscle weakness (generalized)  Bilateral low back pain, with sciatica presence unspecified  Knee pain, left  Pain in joint, ankle and foot, left      Subjective Assessment - 03/13/14 1354    Symptoms Notes pain is 1/10 today notes primary complaint of pain in Lt medial knee and Lt side/hip    Currently in Pain? Yes   Pain Score 1    Pain Location Knee   Pain Orientation Left   Pain Type Chronic pain          [facility] PT Assessment - 03/13/14 1400    Next MD Visit October 6 [name]   Prior Therapy no   Observations Gait:  Excessive varus knee moment durign stance phase resulting in increased coompression force on medial knee durign gait.    Focus on Therapeutic Outcomes (FOTO)  46% limited   Left Hip Extension 20   Left Hip External Rotation  45   Left Hip Internal Rotation  40   Left Ankle Dorsiflexion 20   Left Ankle Plantar Flexion 60   Left Ankle Inversion 40   Left Ankle Eversion 35   Lumbar Flexion 90 degrees no pain   Lumbar Extension 40 degrees no pain   Right Hip Flexion --  4+/5   Right Hip Extension --  4+/5   Left Hip Flexion --  4+/5   Left Hip Extension --  4+/5   Left Hip ABduction 3+/5   Right Knee Flexion 5/5   Right Knee Extension 5/5   Left Knee Flexion 5/5   Left Knee Extension --  4+/5   Right Ankle Dorsiflexion 4/5   Left Ankle Dorsiflexion 4/5   Left Ankle Plantar Flexion 4/5   Left Ankle Inversion 4/5   Left Ankle Eversion 4/5   Lumbar Flexion 4/5   Lumbar Extension 4/5  Findings Obers Positive   Findings Thomas Negative   Findings Ely's  Positive   Findings Piriformis Positive          Adult PT Treatment/Exercise - 03/13/14 0700    Active Hamstring Stretch 3 reps;30 seconds;Limitations   Active Hamstring Stretch Limitations 14" Box   Passive Hamstring Stretch 3 reps;30 seconds;Limitations   Passive Hamstring Stretch Limitations Gastroc Stretch, Slantboard   Prone on Elbows Stretch 3 reps;10 seconds   Quad Stretch 3 reps;30 seconds   Quad Stretch Limitations Prone with rope   Piriformis Stretch 2 reps;30 seconds   Piriformis Stretch Limitations Supine   Other Standing Lumbar Exercises Walking 2D hip excursions   Other Standing Lumbar Exercises Anterior lateral lunge walks 15x each   Straight Leg Raise 15 reps   Straight Leg Raises Limitations floating    Opposite Arm/Leg Raise 15 reps   Plank 5x 10 seconds          Education - 03/13/14 1434    Education provided No          PT Short Term Goals - 03/13/14 1441    Title Patient will  demonstrate increased abdominal muscle strength of 3+/5MMT to increase stability of lumbar spine so patient can stand for >minutes without pain >5/10.   Status Achieved   Title Patient will demonstrate a negative Piriformis test to increase functional hip internal rotation to improve shock absorption during gait.   Status Achieved   Title Patient will demonstrate increased lumbar spine mobility to 90 degrees of flexion and 30 degrees of extension to be able to bend over to touch toes and to look up at ceiling without pain.   Status Achieved   Title Patient will increase ankle dorsiflexion to 15 degrees to improve ability to decelerate landing during gait.   Status Achieved          PT Long Term Goals - 03/13/14 1442    Title  Patient will demonstrate increased abdominal muscle strength of 4/5MMT to increase stability of lumbar spine so patient can stand for >46mnutes without pain >3/10   Time 8   Period Weeks   Status Achieved   Title Patient will demonstrate increased glut max strength to 4/5MMT to increase hip and knee stability to prevent excessive strain on lumbar spine.     Time 8   Period Weeks   Status Partially Met   Title Patient will increase ankle dorsiflexion to 20 degrees to improve ability to decelerate landing during gait.   Time 8   Period Weeks   Status Achieved   Title patient will demonstrate increased hip internal and external rotation to > 40 degrees indicating improved hip mobility   Time 8   Period Weeks   Status Partially Met   Title patient will be able to stand and walk >20 minutes without pain > 2/10 in back, knee, or ankle.     Time 8   Period Weeks   Status Achieved   Additional Long Term Goals Yes   Title Patient will demonstrate increased glut med strength to 4/5MMT to increase hip and knee stability so patient will walk withtou a trendelenberg gait. and excessive st4rain on lumbar spine   Time 4   Period Weeks   Status New   Title Patient will  demsontrate hamstring strength of 5/5  MMT to beable to walk 1 hour to perform black [employer] shopping without pain   Time 4   Period Weeks   Status New  Plan - Apr 08, 2014 1450    Clinical Impression Statement Patient displasy significantly improed pain, AROM, and strength throughtou back, hips, knee and ankle thoguh she contineus to have minor pain secondary to abnormal gait resultign in decreases walking tolerance. Patient continues to have Lt knee pain > Lt ankle > Lt hip pain attributd to weakness of Lt > Rt glute med and max resulting in trendelenberg gait pattenr and excessive knee varus moment. Patient will contineu to benefit form skille dphysical therapy to address these limitations and return to walking > 1 hour at a time withtou pain.    PT Plan Continue focus on improving gait via increasing LE strength and addressing gait specific deficits. Continue physical therapy 2x a week for 4 more weeks to reach remaining long term goals.           G-Codes - Apr 08, 2014 1447    Functional Assessment Tool Used FOTO: 37% limited was 60% limited   Functional Limitation Mobility: Walking and moving around   Mobility: Walking and Moving Around Current Status [phone]) At least 20 percent but less than 40 percent impaired, limited or restricted   Mobility: Walking and Moving Around Goal Status [phone]) At least 1 percent but less than 20 percent impaired, limited or restricted      Problem List Patient Active Problem List   Diagnosis Date Noted  . Knee pain, left 2014/04/08  . Pain in joint, ankle and foot 04/08/14  . Muscle weakness (generalized) 01/10/2014  . Difficulty in walking(719.7) 01/10/2014  . Lumbago 01/10/2014  . Sciatica of left side 04/26/2013  . Chest pain at rest 03/21/2013  . Prediabetes 12/30/2012  . Paroxysmal atrial fibrillation   . ADENOCARCINOMA, COLON 06/24/2009  . GASTROESOPHAGEAL REFLUX DISEASE 06/24/2009  . HYPERLIPIDEMIA 06/12/2009     Tyrick Dunagan  R April 08, 2014, 2:53 PM

## 2014-03-15 ENCOUNTER — Ambulatory Visit (HOSPITAL_COMMUNITY)
Admission: RE | Admit: 2014-03-15 | Discharge: 2014-03-15 | Disposition: A | Discharge status: Home / Self Care | Payer: Medicare Other | Source: Ambulatory Visit | Attending: Family Medicine | Admitting: Family Medicine

## 2014-03-15 ENCOUNTER — Encounter (HOSPITAL_COMMUNITY): Payer: Self-pay | Admitting: Physical Therapy

## 2014-03-15 DIAGNOSIS — M25562 Pain in left knee: Secondary | ICD-10-CM

## 2014-03-15 DIAGNOSIS — M6281 Muscle weakness (generalized): Secondary | ICD-10-CM

## 2014-03-15 DIAGNOSIS — M545 Low back pain: Secondary | ICD-10-CM

## 2014-03-15 DIAGNOSIS — M25572 Pain in left ankle and joints of left foot: Secondary | ICD-10-CM

## 2014-03-15 DIAGNOSIS — Z5189 Encounter for other specified aftercare: Secondary | ICD-10-CM | POA: Diagnosis not present

## 2014-03-15 NOTE — Therapy (Signed)
Physical Therapy Treatment  Patient Details  Name: [name] MRN: [id number] Date of Birth: [birth year]  Encounter Date: 03/15/2014      PT End of Session - 03/15/14 2038    Visit Number 16   Number of Visits 26   Date for PT Re-Evaluation 04/12/14   Authorization Type Medicare   Authorization - Visit Number 16   Authorization - Number of Visits 26   PT Start Time 8841   PT Stop Time 1518   PT Time Calculation (min) 47 min   Activity Tolerance Patient tolerated treatment well      Past Medical History  Diagnosis Date  . Paroxysmal atrial fibrillation     Palpitations; maintained on flecainide; -normal coronary angiography and 1999; negative stress nuclear study in 11/2005  . Gastroesophageal reflux disease   . Hearing loss     mild  . Tobacco abuse, in remission     10-pack-years; quit in 2001  . 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 Hyperlipidemia   . Ovarian cyst 2008  . PONV (postoperative nausea and vomiting)   . Arthritis   . Irregular heartbeat   . Gastroparesis     Past Surgical History  Procedure Laterality Date  . Partial colectomy  2006    adenocarcinoma  . Abdominal hysterectomy  1983    partial  . Cosmetic surgery  09/2007  . Colonoscopy  03/25/2012    [name], MD  . Tonsillectomy    . Cholecystectomy    . Colon surgery  2006    Colon cancer    There were no vitals taken for this visit.  Visit Diagnosis:  Muscle weakness (generalized)  Bilateral low back pain, with sciatica presence unspecified  Knee pain, left  Pain in joint, ankle and foot, left        [facility] Adult PT Treatment/Exercise - 03/15/14 2036    Lumbar Exercises: Stretches   Active Hamstring Stretch 3 reps;30 seconds;Limitations   Active Hamstring Stretch Limitations 14" Box   Passive Hamstring Stretch 3 reps;30 seconds;Limitations   Passive Hamstring Stretch Limitations Gastroc Stretch, Slantboard   Prone on Elbows Stretch 3 reps;10  seconds   Quad Stretch 3 reps;30 seconds   Quad Stretch Limitations Prone with rope   Piriformis Stretch 2 reps;30 seconds   Piriformis Stretch Limitations Supine   Lumbar Exercises: Standing   Functional Squats Limitations Squat reach matrix 3x with 3lb dumbbell   Other Standing Lumbar Exercises Anterior lateral lunge walks 15x each   Lumbar Exercises: Quadruped   Straight Leg Raise 10 reps   Opposite Arm/Leg Raise 15 reps   Plank 5x 10 seconds   Other Quadruped Lumbar Exercises LE ground matrix 5x           PT Short Term Goals - 03/15/14 2044    PT SHORT TERM GOAL #1   Title Patient will demonstrate increased abdominal muscle strength of 3+/5MMT to increase stability of lumbar spine so patient can stand for >minutes without pain >5/10.   Status Achieved   PT SHORT TERM GOAL #2   Title Patient will demonstrate a negative Piriformis test to increase functional hip internal rotation to improve shock absorption during gait.   Status Achieved   PT SHORT TERM GOAL #3   Title Patient will demonstrate increased lumbar spine mobility to 90 degrees of flexion and 30 degrees of extension to be able to bend over to touch toes and to look up at  ceiling without pain.   Status Achieved   PT SHORT TERM GOAL #4   Title Patient will increase ankle dorsiflexion to 15 degrees to improve ability to decelerate landing during gait.   Status Achieved          PT Long Term Goals - 03/15/14 2045    PT LONG TERM GOAL #1   Title  Patient will demonstrate increased abdominal muscle strength of 4/5MMT to increase stability of lumbar spine so patient can stand for >34mnutes without pain >3/10   Status Achieved   PT LONG TERM GOAL #2   Title Patient will demonstrate increased glut max strength to 4/5MMT to increase hip and knee stability to prevent excessive strain on lumbar spine.     Status Partially Met   PT LONG TERM GOAL #3   Title Patient will increase ankle dorsiflexion to 20 degrees to  improve ability to decelerate landing during gait.   Status Achieved   PT LONG TERM GOAL #4   Title patient will demonstrate increased hip internal and external rotation to > 40 degrees indicating improved hip mobility   Status Partially Met          Plan - 03/15/14 2040    Clinical Impression Statement Patient displasy significantly improved pain, AROM, and strength throughout back, hips, knee and ankle though she continues to have minor pain secondary to abnormal gait resulting in decreased walking tolerance. Patient continues to have Lt knee pain > Lt ankle > Lt hip pain attributd to weakness of Lt > Rt glute med and max resulting in trendelenberg gait pattern and excessive knee varus moment. Patient will continue to benefit form skilled physical therapy to address these limitations and return to walking > 1 hour at a time without pain   PT Plan Focus of therapy on utilizing functional strengtheing exercises to increase Glut max, glut med, and hamstring strength to increase knee and hip stability. Secondary focus on abdominal strengthening via review and education on UE and LE ground matrix for HEP.         Problem List Patient Active Problem List   Diagnosis Date Noted  . Knee pain, left 03/13/2014  . Pain in joint, ankle and foot 03/13/2014  . Muscle weakness (generalized) 01/10/2014  . Difficulty in walking(719.7) 01/10/2014  . Lumbago 01/10/2014  . Sciatica of left side 04/26/2013  . Chest pain at rest 03/21/2013  . Prediabetes 12/30/2012  . Paroxysmal atrial fibrillation   . ADENOCARCINOMA, COLON 06/24/2009  . GASTROESOPHAGEAL REFLUX DISEASE 06/24/2009  . HYPERLIPIDEMIA 06/12/2009     Asalee Barrette R 03/15/2014, 8:46 PM

## 2014-03-19 ENCOUNTER — Ambulatory Visit (HOSPITAL_COMMUNITY)
Admission: RE | Admit: 2014-03-19 | Discharge: 2014-03-19 | Disposition: A | Discharge status: Home / Self Care | Payer: Medicare Other | Source: Ambulatory Visit | Attending: Family Medicine | Admitting: Family Medicine

## 2014-03-19 ENCOUNTER — Telehealth: Payer: Self-pay | Admitting: Family Medicine

## 2014-03-19 DIAGNOSIS — E785 Hyperlipidemia, unspecified: Secondary | ICD-10-CM | POA: Diagnosis not present

## 2014-03-19 DIAGNOSIS — M25572 Pain in left ankle and joints of left foot: Secondary | ICD-10-CM

## 2014-03-19 DIAGNOSIS — Z5189 Encounter for other specified aftercare: Secondary | ICD-10-CM | POA: Diagnosis not present

## 2014-03-19 DIAGNOSIS — Z79899 Other long term (current) drug therapy: Secondary | ICD-10-CM

## 2014-03-19 DIAGNOSIS — M6281 Muscle weakness (generalized): Secondary | ICD-10-CM

## 2014-03-19 DIAGNOSIS — M25562 Pain in left knee: Secondary | ICD-10-CM

## 2014-03-19 DIAGNOSIS — M545 Low back pain: Secondary | ICD-10-CM

## 2014-03-19 NOTE — Telephone Encounter (Signed)
Does patient need BW for upcoming physical?

## 2014-03-19 NOTE — Telephone Encounter (Signed)
Lipid, liver please

## 2014-03-19 NOTE — Telephone Encounter (Signed)
12/25/13: cbc, lip, liv, met7, a1c

## 2014-03-19 NOTE — Telephone Encounter (Signed)
Patient notified and verbalized understanding. 

## 2014-03-19 NOTE — Therapy (Signed)
Physical Therapy Treatment  Patient Details  Name: [name] MRN: [id number] Date of Birth: 12/01/1940  Encounter Date: 03/19/2014      PT End of Session - 03/19/14 1119    Visit Number 17   Number of Visits 26   Date for PT Re-Evaluation 04/12/14   Authorization Type Medicare   Authorization - Visit Number 69   Authorization - Number of Visits 26   PT Start Time 1110   PT Stop Time 1150   PT Time Calculation (min) 40 min      Past Medical History  Diagnosis Date  . Paroxysmal atrial fibrillation     Palpitations; maintained on flecainide; -normal coronary angiography and 1999; negative stress nuclear study in 11/2005  . Gastroesophageal reflux disease   . Hearing loss     mild  . Tobacco abuse, in remission     10-pack-years; quit in 2001  . 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 Hyperlipidemia   . Ovarian cyst 2008  . PONV (postoperative nausea and vomiting)   . Arthritis   . Irregular heartbeat   . Gastroparesis     Past Surgical History  Procedure Laterality Date  . Partial colectomy  2006    adenocarcinoma  . Abdominal hysterectomy  1983    partial  . Cosmetic surgery  09/2007  . Colonoscopy  03/25/2012    [name], MD  . Tonsillectomy    . Cholecystectomy    . Colon surgery  2006    Colon cancer    There were no vitals taken for this visit.  Visit Diagnosis:  Muscle weakness (generalized)  Bilateral low back pain, with sciatica presence unspecified  Knee pain, left  Pain in joint, ankle and foot, left          [facility] Adult PT Treatment/Exercise - 03/19/14 1121    Lumbar Exercises: Stretches   Active Hamstring Stretch 3 reps;30 seconds;Limitations   Active Hamstring Stretch Limitations 14" Box   Passive Hamstring Stretch 3 reps;30 seconds;Limitations   Passive Hamstring Stretch Limitations Gastroc Stretch, Slantboard   Prone on Elbows Stretch 2 reps;60 seconds   Piriformis Stretch 2 reps;30 seconds   Piriformis Stretch Limitations Supine   Lumbar Exercises: Standing   Functional Squats Limitations Squat reach matrix 4x with 3lb dumbbell                Plan - 03/19/14 1139    Clinical Impression Statement Pt became dizzy during squat reach matrix, only performing 4 reps when requested to sit down due to increased dizziness.  BP taken at 136/48mmHg; HR 79 bpm.  Pt reported this was high for her.  BP went up 2 minutes later to 143/75 but then began to decrease.  After 6 minutes rest decreased to  117/56mmHg .  Advised patient to contact MD and let them know her symptoms.  Pt has return MD appointment 11/19 but dizziness has been going on for several weeks now and getting worse; may be connected to medication changes made several weeks ago. Completed mat stretches following this episode.  All other standing exercises held today.   Pt reported feeling much better at end of session.   PT Plan Focus of therapy on utilizing functional strengtheing exercises to increase Glut max, glut med, and hamstring strength to increase knee and hip stability. Secondary focus on abdominal strengthening via review and education on UE and LE ground matrix for HEP.  Problem List Patient Active Problem List   Diagnosis Date Noted  . Knee pain, left 03/13/2014  . Pain in joint, ankle and foot 03/13/2014  . Muscle weakness (generalized) 01/10/2014  . Difficulty in walking(719.7) 01/10/2014  . Lumbago 01/10/2014  . Sciatica of left side 04/26/2013  . Chest pain at rest 03/21/2013  . Prediabetes 12/30/2012  . Paroxysmal atrial fibrillation   . ADENOCARCINOMA, COLON 06/24/2009  . GASTROESOPHAGEAL REFLUX DISEASE 06/24/2009  . HYPERLIPIDEMIA 06/12/2009     Teena Irani, PTA/CLT 03/19/2014, 12:22 PM

## 2014-03-20 LAB — LIPID PANEL
CHOL/HDL RATIO: 3 ratio
CHOLESTEROL: 242 mg/dL — AB (ref 0–200)
HDL: 82 mg/dL (ref >39)
LDL Cholesterol: 124 mg/dL — ABNORMAL HIGH (ref 0–99)
TRIGLYCERIDES: 181 mg/dL — AB (ref <150)
VLDL: 36 mg/dL (ref 0–40)

## 2014-03-20 LAB — HEPATIC FUNCTION PANEL
ALBUMIN: 4.3 g/dL (ref 3.5–5.2)
ALT: 15 U/L (ref 0–35)
AST: 17 U/L (ref 0–37)
Alkaline Phosphatase: 61 U/L (ref 39–117)
Bilirubin, Direct: 0.1 mg/dL (ref 0.0–0.3)
Indirect Bilirubin: 0.3 mg/dL (ref 0.2–1.2)
TOTAL PROTEIN: 6.8 g/dL (ref 6.0–8.3)
Total Bilirubin: 0.4 mg/dL (ref 0.2–1.2)

## 2014-03-22 ENCOUNTER — Ambulatory Visit (HOSPITAL_COMMUNITY)
Admission: RE | Admit: 2014-03-22 | Discharge: 2014-03-22 | Disposition: A | Discharge status: Home / Self Care | Payer: Medicare Other | Source: Ambulatory Visit | Attending: Family Medicine | Admitting: Family Medicine

## 2014-03-22 DIAGNOSIS — M25572 Pain in left ankle and joints of left foot: Secondary | ICD-10-CM

## 2014-03-22 DIAGNOSIS — Z5189 Encounter for other specified aftercare: Secondary | ICD-10-CM | POA: Diagnosis not present

## 2014-03-22 DIAGNOSIS — M25562 Pain in left knee: Secondary | ICD-10-CM

## 2014-03-22 DIAGNOSIS — M545 Low back pain: Secondary | ICD-10-CM

## 2014-03-22 DIAGNOSIS — M6281 Muscle weakness (generalized): Secondary | ICD-10-CM

## 2014-03-22 NOTE — Addendum Note (Signed)
Encounter addended by: Leia Alf, PT on: 03/22/2014  9:15 PM<BR>     Documentation filed: Clinical Notes

## 2014-03-22 NOTE — Therapy (Cosign Needed)
Physical Therapy Discharge Summary  Patient Details  Name: [name] MRN: [id number] Date of Birth: [birth year]  Encounter Date: 03/22/2014      PT End of Session - 03/22/14 2106    Visit Number 18   Number of Visits 26   Date for PT Re-Evaluation 04/12/14   Authorization Type Medicare   Authorization - Visit Number 18   Authorization - Number of Visits 26   PT Start Time (ACUTE ONLY) 1104   PT Stop Time (ACUTE ONLY) 1150   PT Time Calculation (min) (ACUTE ONLY) 46 min   Activity Tolerance Patient tolerated treatment well   Behavior During Therapy WFL for tasks assessed/performed      Past Medical History  Diagnosis Date  . Paroxysmal atrial fibrillation     Palpitations; maintained on flecainide; -normal coronary angiography and 1999; negative stress nuclear study in 11/2005  . Gastroesophageal reflux disease   . Hearing loss     mild  . Tobacco abuse, in remission     10-pack-years; quit in 2001  . 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 Hyperlipidemia   . Ovarian cyst 2008  . PONV (postoperative nausea and vomiting)   . Arthritis   . Irregular heartbeat   . Gastroparesis     Past Surgical History  Procedure Laterality Date  . Partial colectomy  2006    adenocarcinoma  . Abdominal hysterectomy  1983    partial  . Cosmetic surgery  09/2007  . Colonoscopy  03/25/2012    [name], MD  . Tonsillectomy    . Cholecystectomy    . Colon surgery  2006    Colon cancer    There were no vitals taken for this visit.  Visit Diagnosis:  Muscle weakness (generalized)  Knee pain, left  Bilateral low back pain, with sciatica presence unspecified  Pain in joint, ankle and foot, left      Subjective Assessment - 03/22/14 2102    Symptoms Pt states she is never painfree even with pain meds though she notes she feels much better and understands everythign that she can do and should do at home to keep her pain minimal. Patient states  readiness to discharge. States she currently has 2/10 pain in her Lt posterior lateral LE and hip.   Currently in Pain? Yes   Pain Score 1    Pain Location Knee   Pain Orientation Left          [facility] PT Assessment - 03/22/14 2103    Assessment   Next MD Visit October 6 [name]   Prior Therapy no   Observation/Other Assessments   Observations Gait: Excessive varus knee moment durign stance phase resulting in increased coompression force on medial knee durign gait.    Focus on Therapeutic Outcomes (FOTO)  26% limited   AROM   Left Hip Extension 20   Left Hip External Rotation  45   Left Hip Internal Rotation  40   Left Ankle Dorsiflexion 20   Left Ankle Plantar Flexion 60   Left Ankle Inversion 40   Left Ankle Eversion 35   Lumbar Flexion 90 degrees no pain   Lumbar Extension 40 degrees no pain   Strength   Right Hip Flexion --  4+/5   Right Hip Extension --  4+/5   Left Hip Flexion --  4+/5   Left Hip Extension --  4+/5   Left Hip ABduction 3+/5   Right  Knee Flexion 5/5   Right Knee Extension 5/5   Left Knee Flexion 5/5   Left Knee Extension --  4+/5   Right Ankle Dorsiflexion 4/5   Left Ankle Dorsiflexion 4/5   Left Ankle Plantar Flexion 4/5   Left Ankle Inversion 4/5   Left Ankle Eversion 4/5   Lumbar Flexion 5/5   Lumbar Extension 5/5   Trendelenburg Test   Findings Negative   Thomas Test    Findings Negative   Ober's Test   Findings Positive   Piriformis Test   Findings Negative          [facility] Adult PT Treatment/Exercise - 03/22/14 0001    Lumbar Exercises: Stretches   Active Hamstring Stretch 3 reps;30 seconds;Limitations   Active Hamstring Stretch Limitations 14" Box   Passive Hamstring Stretch 3 reps;30 seconds;Limitations   Passive Hamstring Stretch Limitations Gastroc Stretch, Slantboard   Prone on Elbows Stretch 2 reps;60 seconds   Piriformis Stretch 2 reps;30 seconds   Piriformis Stretch Limitations Supine   Lumbar Exercises: Standing    Functional Squats Limitations Squat reach matrix 4x with 3lb dumbbell   Forward Lunge 10 reps   Lumbar Exercises: Quadruped   Other Quadruped Lumbar Exercises LE and UE ground matrix 5x          PT Education - 03/22/14 2106    Education provided Yes   Education Details HEP reviewed, previously given   Person(s) Educated Patient   Methods Explanation;Demonstration   Comprehension Verbalized understanding;Returned demonstration          PT Short Term Goals - 03/22/14 2110    PT SHORT TERM GOAL #1   Title Patient will demonstrate increased abdominal muscle strength of 3+/5MMT to increase stability of lumbar spine so patient can stand for >minutes without pain >5/10.   Status Achieved   PT SHORT TERM GOAL #2   Title Patient will demonstrate a negative Piriformis test to increase functional hip internal rotation to improve shock absorption during gait.   Status Achieved   PT SHORT TERM GOAL #3   Title Patient will demonstrate increased lumbar spine mobility to 90 degrees of flexion and 30 degrees of extension to be able to bend over to touch toes and to look up at ceiling without pain.   Status Achieved   PT SHORT TERM GOAL #4   Title Patient will increase ankle dorsiflexion to 15 degrees to improve ability to decelerate landing during gait.   Status Achieved          PT Long Term Goals - 03/22/14 2111    PT LONG TERM GOAL #1   Title  Patient will demonstrate increased abdominal muscle strength of 4/5MMT to increase stability of lumbar spine so patient can stand for >59mnutes without pain >3/10   Status Achieved   PT LONG TERM GOAL #2   Title Patient will demonstrate increased glut max strength to 4/5MMT to increase hip and knee stability to prevent excessive strain on lumbar spine.     Status Achieved   PT LONG TERM GOAL #3   Title Patient will increase ankle dorsiflexion to 20 degrees to improve ability to decelerate landing during gait.   Status Achieved   PT LONG TERM  GOAL #4   Title patient will demonstrate increased hip internal and external rotation to > 40 degrees indicating improved hip mobility   Status Partially Met   PT LONG TERM GOAL #5   Title patient will be able to stand and walk >20  minutes without pain > 2/10 in back, knee, or ankle.     Status Achieved   PT LONG TERM GOAL #6   Title Patient will demonstrate increased glut med strength to 4/5MMT to increase hip and knee stability so patient will walk without a trendelenberg gait. and excessive st4rain on lumbar spine   Status Partially Met   PT LONG TERM GOAL #7   Title Patient will demsontrate hamstring strength of 5/5  MMT to beable to walk 1 hour to perform black friday shopping without pain   Status Partially Met          Plan - 03-27-14 2107    Clinical Impression Statement Patien displasy decreased pain and symptoms have plateued despite contineud improvement in LE and Lumbar spine strength and mobility. It is exected that patient will contineu to progress towards meeting remainign goals independently with HEP and returnign to the [facility] to perform regular phsyical activity   PT Plan Patient discharge with HEP          G-Codes - 03/27/2014 2112    Functional Assessment Tool Used FOTO: 26% limited was 60% limited   Functional Limitation Mobility: Walking and moving around   Mobility: Walking and Moving Around Current Status [phone]) At least 20 percent but less than 40 percent impaired, limited or restricted   Mobility: Walking and Moving Around Goal Status [phone]) At least 1 percent but less than 20 percent impaired, limited or restricted   Mobility: Walking and Moving Around Discharge Status [phone]) At least 20 percent but less than 40 percent impaired, limited or restricted      Problem List Patient Active Problem List   Diagnosis Date Noted  . Knee pain, left 03/13/2014  . Pain in joint, ankle and foot 03/13/2014  . Muscle weakness (generalized) 01/10/2014  . Difficulty in  walking(719.7) 01/10/2014  . Lumbago 01/10/2014  . Sciatica of left side 04/26/2013  . Chest pain at rest 03/21/2013  . Prediabetes 12/30/2012  . Paroxysmal atrial fibrillation   . ADENOCARCINOMA, COLON 06/24/2009  . GASTROESOPHAGEAL REFLUX DISEASE 06/24/2009  . HYPERLIPIDEMIA 06/12/2009       Kerry Chisolm R 03/27/2014, 9:13 PM

## 2014-03-22 NOTE — Therapy (Signed)
Physical Therapy Discharge Summary  Patient Details  Name: [name] MRN: [id number] Date of Birth: 1940/05/12  Encounter Date: 03/22/2014      PT End of Session - 03/22/14 2106    Visit Number 18   Number of Visits 26   Date for PT Re-Evaluation 04/12/14   Authorization Type Medicare   Authorization - Visit Number 18   Authorization - Number of Visits 26   PT Start Time (ACUTE ONLY) 1104   PT Stop Time (ACUTE ONLY) 1150   PT Time Calculation (min) (ACUTE ONLY) 46 min   Activity Tolerance Patient tolerated treatment well   Behavior During Therapy WFL for tasks assessed/performed      Past Medical History  Diagnosis Date  . Paroxysmal atrial fibrillation     Palpitations; maintained on flecainide; -normal coronary angiography and 1999; negative stress nuclear study in 11/2005  . Gastroesophageal reflux disease   . Hearing loss     mild  . Tobacco abuse, in remission     10-pack-years; quit in 2001  . 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 Hyperlipidemia   . Ovarian cyst 2008  . PONV (postoperative nausea and vomiting)   . Arthritis   . Irregular heartbeat   . Gastroparesis     Past Surgical History  Procedure Laterality Date  . Partial colectomy  2006    adenocarcinoma  . Abdominal hysterectomy  1983    partial  . Cosmetic surgery  09/2007  . Colonoscopy  03/25/2012    [name], MD  . Tonsillectomy    . Cholecystectomy    . Colon surgery  2006    Colon cancer    There were no vitals taken for this visit.  Visit Diagnosis:  Muscle weakness (generalized)  Knee pain, left  Bilateral low back pain, with sciatica presence unspecified  Pain in joint, ankle and foot, left      Subjective Assessment - 03/22/14 2102    Symptoms Pt states she is never painfree even with pain meds though she notes she feels much better and understands everythign that she can do and should do at home to keep her pain minimal. Patient states  readiness to discharge. States she currently has 2/10 pain in her Lt posterior lateral LE and hip.   Currently in Pain? Yes   Pain Score 1    Pain Location Knee   Pain Orientation Left          [facility] PT Assessment - 03/22/14 2103    Assessment   Next MD Visit October 6 [name]   Prior Therapy no   Observation/Other Assessments   Observations Gait: Excessive varus knee moment durign stance phase resulting in increased coompression force on medial knee durign gait.    Focus on Therapeutic Outcomes (FOTO)  26% limited   AROM   Left Hip Extension 20   Left Hip External Rotation  45   Left Hip Internal Rotation  40   Left Ankle Dorsiflexion 20   Left Ankle Plantar Flexion 60   Left Ankle Inversion 40   Left Ankle Eversion 35   Lumbar Flexion 90 degrees no pain   Lumbar Extension 40 degrees no pain   Strength   Right Hip Flexion --  4+/5   Right Hip Extension --  4+/5   Left Hip Flexion --  4+/5   Left Hip Extension --  4+/5   Left Hip ABduction 3+/5   Right  Knee Flexion 5/5   Right Knee Extension 5/5   Left Knee Flexion 5/5   Left Knee Extension --  4+/5   Right Ankle Dorsiflexion 4/5   Left Ankle Dorsiflexion 4/5   Left Ankle Plantar Flexion 4/5   Left Ankle Inversion 4/5   Left Ankle Eversion 4/5   Lumbar Flexion 5/5   Lumbar Extension 5/5   Trendelenburg Test   Findings Negative   Thomas Test    Findings Negative   Ober's Test   Findings Positive   Piriformis Test   Findings Negative          [facility] Adult PT Treatment/Exercise - 03/22/14 0001    Lumbar Exercises: Stretches   Active Hamstring Stretch 3 reps;30 seconds;Limitations   Active Hamstring Stretch Limitations 14" Box   Passive Hamstring Stretch 3 reps;30 seconds;Limitations   Passive Hamstring Stretch Limitations Gastroc Stretch, Slantboard   Prone on Elbows Stretch 2 reps;60 seconds   Piriformis Stretch 2 reps;30 seconds   Piriformis Stretch Limitations Supine   Lumbar Exercises: Standing    Functional Squats Limitations Squat reach matrix 4x with 3lb dumbbell   Forward Lunge 10 reps   Lumbar Exercises: Quadruped   Other Quadruped Lumbar Exercises LE and UE ground matrix 5x          PT Education - 03/22/14 2106    Education provided Yes   Education Details HEP reviewed, previously given   Person(s) Educated Patient   Methods Explanation;Demonstration   Comprehension Verbalized understanding;Returned demonstration          PT Short Term Goals - 03/22/14 2110    PT SHORT TERM GOAL #1   Title Patient will demonstrate increased abdominal muscle strength of 3+/5MMT to increase stability of lumbar spine so patient can stand for >minutes without pain >5/10.   Status Achieved   PT SHORT TERM GOAL #2   Title Patient will demonstrate a negative Piriformis test to increase functional hip internal rotation to improve shock absorption during gait.   Status Achieved   PT SHORT TERM GOAL #3   Title Patient will demonstrate increased lumbar spine mobility to 90 degrees of flexion and 30 degrees of extension to be able to bend over to touch toes and to look up at ceiling without pain.   Status Achieved   PT SHORT TERM GOAL #4   Title Patient will increase ankle dorsiflexion to 15 degrees to improve ability to decelerate landing during gait.   Status Achieved          PT Long Term Goals - 03/22/14 2111    PT LONG TERM GOAL #1   Title  Patient will demonstrate increased abdominal muscle strength of 4/5MMT to increase stability of lumbar spine so patient can stand for >14mnutes without pain >3/10   Status Achieved   PT LONG TERM GOAL #2   Title Patient will demonstrate increased glut max strength to 4/5MMT to increase hip and knee stability to prevent excessive strain on lumbar spine.     Status Achieved   PT LONG TERM GOAL #3   Title Patient will increase ankle dorsiflexion to 20 degrees to improve ability to decelerate landing during gait.   Status Achieved   PT LONG TERM  GOAL #4   Title patient will demonstrate increased hip internal and external rotation to > 40 degrees indicating improved hip mobility   Status Partially Met   PT LONG TERM GOAL #5   Title patient will be able to stand and walk >20  minutes without pain > 2/10 in back, knee, or ankle.     Status Achieved   PT LONG TERM GOAL #6   Title Patient will demonstrate increased glut med strength to 4/5MMT to increase hip and knee stability so patient will walk without a trendelenberg gait. and excessive st4rain on lumbar spine   Status Partially Met   PT LONG TERM GOAL #7   Title Patient will demsontrate hamstring strength of 5/5  MMT to beable to walk 1 hour to perform black friday shopping without pain   Status Partially Met          Plan - Apr 21, 2014 2107    Clinical Impression Statement Patien displasy decreased pain and symptoms have plateued despite contineud improvement in LE and Lumbar spine strength and mobility. It is exected that patient will contineu to progress towards meeting remainign goals independently with HEP and returnign to the [employer] to perform regular phsyical activity   PT Plan Patient discharge with HEP          G-Codes - 04-21-14 2112    Functional Assessment Tool Used FOTO: 26% limited was 60% limited   Functional Limitation Mobility: Walking and moving around   Mobility: Walking and Moving Around Current Status [phone]) At least 20 percent but less than 40 percent impaired, limited or restricted   Mobility: Walking and Moving Around Goal Status [phone]) At least 1 percent but less than 20 percent impaired, limited or restricted   Mobility: Walking and Moving Around Discharge Status [phone]) At least 20 percent but less than 40 percent impaired, limited or restricted      Problem List Patient Active Problem List   Diagnosis Date Noted  . Knee pain, left 03/13/2014  . Pain in joint, ankle and foot 03/13/2014  . Muscle weakness (generalized) 01/10/2014  . Difficulty in  walking(719.7) 01/10/2014  . Lumbago 01/10/2014  . Sciatica of left side 04/26/2013  . Chest pain at rest 03/21/2013  . Prediabetes 12/30/2012  . Paroxysmal atrial fibrillation   . ADENOCARCINOMA, COLON 06/24/2009  . GASTROESOPHAGEAL REFLUX DISEASE 06/24/2009  . HYPERLIPIDEMIA 06/12/2009                                              Tijuan Dantes R 2014-04-21, 9:15 PM

## 2014-03-22 NOTE — Addendum Note (Signed)
Encounter addended by: Leia Alf, PT on: 03/22/2014  9:16 PM<BR>     Documentation filed: Episodes

## 2014-03-23 ENCOUNTER — Encounter (HOSPITAL_COMMUNITY): Payer: Self-pay | Admitting: Physical Therapy

## 2014-03-23 NOTE — Therapy (Signed)
  Patient Details  Name: [name] MRN: [id number] Date of Birth: Jun 15, 1940  Encounter Date: 03/23/2014  PHYSICAL THERAPY DISCHARGE SUMMARY  Visits from Start of Care: 17  Current functional level related to goals / functional outcomes: PT Short Term Goals - 03/22/14 2110    PT SHORT TERM GOAL #1   Title Patient will demonstrate increased abdominal muscle strength of 3+/5MMT to increase stability of lumbar spine so patient can stand for >minutes without pain >5/10.   Status Achieved   PT SHORT TERM GOAL #2   Title Patient will demonstrate a negative Piriformis test to increase functional hip internal rotation to improve shock absorption during gait.   Status Achieved   PT SHORT TERM GOAL #3   Title Patient will demonstrate increased lumbar spine mobility to 90 degrees of flexion and 30 degrees of extension to be able to bend over to touch toes and to look up at ceiling without pain.   Status Achieved   PT SHORT TERM GOAL #4   Title Patient will increase ankle dorsiflexion to 15 degrees to improve ability to decelerate landing during gait.   Status Achieved          PT Long Term Goals - 03/22/14 2111    PT LONG TERM GOAL #1   Title Patient will demonstrate increased abdominal muscle strength of 4/5MMT to increase stability of lumbar spine so patient can stand for >64minutes without pain >3/10   Status Achieved   PT LONG TERM GOAL #2   Title Patient will demonstrate increased glut max strength to 4/5MMT to increase hip and knee stability to prevent excessive strain on lumbar spine.    Status Achieved   PT LONG TERM GOAL #3   Title Patient will increase ankle dorsiflexion to 20 degrees to improve ability to decelerate landing during gait.   Status Achieved   PT LONG TERM GOAL #4   Title patient will demonstrate increased hip internal and external rotation to > 40 degrees indicating improved hip mobility    Status Partially Met   PT LONG TERM GOAL #5   Title patient will be able to stand and walk >20 minutes without pain > 2/10 in back, knee, or ankle.    Status Achieved   PT LONG TERM GOAL #6   Title Patient will demonstrate increased glut med strength to 4/5MMT to increase hip and knee stability so patient will walk without a trendelenberg gait. and excessive st4rain on lumbar spine   Status Partially Met   PT LONG TERM GOAL #7   Title Patient will demsontrate hamstring strength of 5/5 MMT to beable to walk 1 hour to perform black friday shopping without pain   Status Partially Met       Remaining deficits: Continued pain 2/10 constant that has plateau following initial improvements with therapy    Plan: Patient agrees to discharge.  Patient goals were not met. Patient is being discharged due to meeting the stated rehab goals.  ?????      Gerilynn Mccullars R 03/23/2014, 7:58 AM

## 2014-03-29 ENCOUNTER — Encounter: Payer: Self-pay | Admitting: Family Medicine

## 2014-03-29 ENCOUNTER — Ambulatory Visit (INDEPENDENT_AMBULATORY_CARE_PROVIDER_SITE_OTHER): Payer: Medicare Other | Admitting: Family Medicine

## 2014-03-29 VITALS — BP 122/88 | Ht 62.0 in | Wt 111.0 lb

## 2014-03-29 DIAGNOSIS — E785 Hyperlipidemia, unspecified: Secondary | ICD-10-CM

## 2014-03-29 DIAGNOSIS — Z23 Encounter for immunization: Secondary | ICD-10-CM | POA: Diagnosis not present

## 2014-03-29 DIAGNOSIS — R5383 Other fatigue: Secondary | ICD-10-CM | POA: Diagnosis not present

## 2014-03-29 DIAGNOSIS — G629 Polyneuropathy, unspecified: Secondary | ICD-10-CM

## 2014-03-29 MED ORDER — HYDROCODONE-ACETAMINOPHEN 10-325 MG PO TABS: 1.0000 | ORAL_TABLET | Freq: Two times a day (BID) | ORAL | Status: DC | PRN

## 2014-03-29 MED ORDER — ROSUVASTATIN CALCIUM 20 MG PO TABS: 20.0000 mg | ORAL_TABLET | Freq: Every day | ORAL | Status: DC

## 2014-03-29 NOTE — Progress Notes (Signed)
   Subjective:    Patient ID: [name], female    DOB: [birth year], 73 y.o.   MRN: [id number]  HPI Patient is here today for a check up.  She would like to go over her BW.  Pt c/o dizziness for the past 3 weeks. She notices it when she gets out of bed in the morning. She denies any chest tightness pressure pain she denies her heart running fast Flu vaccine.  No other concerns.  25 minutes spent with patient going over these issues.  Review of Systems     Objective:   Physical Exam Lungs clear heart regular neck no masses pulse normal extremities no edema       Assessment & Plan:  She complains of unusual sensation both hands and feet that started since Crestor it is possible this could be a side effect possibly a peripheral neuropathy she is to stop taking Crestor follow-up in 4 weeks possibly had to reinitiate a different cholesterol medicine  Patient was understandably frustrated because her cholesterol was elevated despite changing from pravastatin to Crestor. Patient was talked to at length how this could occur we may have to go back to pravastatin depends on the above  Patient relates intermittent dizziness I checked her blood pressure laying sitting standing I don't find any significant causes there if it has ongoing dizziness consider referral to neurology  Patient does not feel like she needs to continue atrial fibrillation medication I told her she needs to continue her medicine for now and discuss this with her cardiologist when she goes to see him

## 2014-04-02 ENCOUNTER — Ambulatory Visit: Payer: Medicare Other | Admitting: Family Medicine

## 2014-04-03 ENCOUNTER — Other Ambulatory Visit: Payer: Self-pay

## 2014-04-03 DIAGNOSIS — G629 Polyneuropathy, unspecified: Secondary | ICD-10-CM | POA: Diagnosis not present

## 2014-04-03 DIAGNOSIS — G609 Hereditary and idiopathic neuropathy, unspecified: Secondary | ICD-10-CM | POA: Diagnosis not present

## 2014-04-03 DIAGNOSIS — E538 Deficiency of other specified B group vitamins: Secondary | ICD-10-CM | POA: Diagnosis not present

## 2014-04-03 DIAGNOSIS — R5383 Other fatigue: Secondary | ICD-10-CM | POA: Diagnosis not present

## 2014-04-04 LAB — VITAMIN B12: Vitamin B-12: 1103 pg/mL — ABNORMAL HIGH (ref 211–911)

## 2014-04-04 LAB — T4, FREE: FREE T4: 1.05 ng/dL (ref 0.80–1.80)

## 2014-04-04 LAB — TSH: TSH: 1.002 u[IU]/mL (ref 0.350–4.500)

## 2014-04-20 ENCOUNTER — Ambulatory Visit: Payer: Medicare Other | Admitting: Family Medicine

## 2014-04-23 ENCOUNTER — Ambulatory Visit (INDEPENDENT_AMBULATORY_CARE_PROVIDER_SITE_OTHER): Payer: Medicare Other | Admitting: Family Medicine

## 2014-04-23 ENCOUNTER — Encounter: Payer: Self-pay | Admitting: Family Medicine

## 2014-04-23 VITALS — BP 112/74 | Ht 62.0 in | Wt 112.0 lb

## 2014-04-23 DIAGNOSIS — G629 Polyneuropathy, unspecified: Secondary | ICD-10-CM | POA: Diagnosis not present

## 2014-04-23 MED ORDER — ROSUVASTATIN CALCIUM 40 MG PO TABS: 40.0000 mg | ORAL_TABLET | Freq: Every day | ORAL | Status: DC

## 2014-04-23 NOTE — Progress Notes (Signed)
   Subjective:    Patient ID: [name], female    DOB: August 30, 1940, 73 y.o.   MRN: [id number]  HPI Patient is here today for a f/u on the numbness in her hands and her BW results.  She was told to quit taking the Crestor.we discussed this at length. It was thought it could've been related somewhat to her symptoms but stopping the Crestor for the past 4 weeks has not cause the numbness to go away. So I do not feel it is related to her Crestor. We will restart this medicine.  Still having pain/numbness in her hands. Not as bad as it was. Pain comes and goes.   Consultation for neurologist.  Patient concerned about her arms and legs. 15 minutes spent with patient.  Review of Systems     Objective:   Physical Exam She has subjective discomfort in her low back as well as left leg with sciatica she also has lungs clear heart regular pulse normal blood pressure good       Assessment & Plan:  Neu[place]-we will go ahead and set up with neurology in [place] will need to have nerve conduction study. She has neuropathic symptoms in the left leg could be coming from her back could be coming from foot problem. She also has intermittent numbness in her hands I do not feel it is related to her cholesterol medicine.patient has prediabetes I do not feel this is the cause of her numbness. Also patient more than likely will need nerve conduction studies  Start Crestor-we will initiate 40 mg daily check lipid liver in approximately 8 weeks. Patient did not meet cholesterol goals on 80 mg of pravastatin. She was showing some signs of diabetes on Lipitor.  Labs in 8 weeks-she will call us for the lab work in 8 weeks  Call when needing pai n meds-patient uses pain medicine infrequently blood it is likely she will run out the next few weeks she stated she will call when she needs a refill

## 2014-05-10 ENCOUNTER — Other Ambulatory Visit: Payer: Self-pay | Admitting: *Deleted

## 2014-05-10 MED ORDER — FLECAINIDE ACETATE 100 MG PO TABS: 100.0000 mg | ORAL_TABLET | Freq: Two times a day (BID) | ORAL | Status: DC

## 2014-05-21 ENCOUNTER — Other Ambulatory Visit: Payer: Self-pay | Admitting: Family Medicine

## 2014-05-21 DIAGNOSIS — Z1231 Encounter for screening mammogram for malignant neoplasm of breast: Secondary | ICD-10-CM

## 2014-06-19 ENCOUNTER — Encounter: Payer: Self-pay | Admitting: Neurology

## 2014-06-19 ENCOUNTER — Ambulatory Visit (INDEPENDENT_AMBULATORY_CARE_PROVIDER_SITE_OTHER): Payer: Medicare Other | Admitting: Neurology

## 2014-06-19 ENCOUNTER — Other Ambulatory Visit: Payer: Self-pay | Admitting: *Deleted

## 2014-06-19 VITALS — BP 100/64 | HR 64 | Ht 62.0 in | Wt 112.4 lb

## 2014-06-19 DIAGNOSIS — G9050 Complex regional pain syndrome I, unspecified: Secondary | ICD-10-CM

## 2014-06-19 DIAGNOSIS — R202 Paresthesia of skin: Secondary | ICD-10-CM

## 2014-06-19 DIAGNOSIS — M79605 Pain in left leg: Secondary | ICD-10-CM

## 2014-06-19 NOTE — Patient Instructions (Addendum)
1. EMG of the left arm and leg 2. We will contact you with the results of your EMG

## 2014-06-19 NOTE — Progress Notes (Signed)
[facility] Clinic Note - Initial Visit   Date: 06/19/2014   [name] MRN: [id number] DOB: January 25, 1941   Dear Dr. Wolfgang Phoenix:  Thank you for your kind referral of [name] for consultation of left leg discomfort and bilateral hand paresthesias. Although her history is well known to you, please allow [place] to reiterate it for the purpose of our medical record. The patient was accompanied to the clinic by husband who also provides collateral information.     History of Present Illness: [name] is a 74 y.o. right-handed Caucasian female with colon cancer s/p resection (2006), prior tobacco use, paroxsymal atrial fibrillation (on apixaban), GERD, and hyperlipidemia presenting for evaluation of left leg discomfort and bilateral hand paresthesias.  In December 2014, she was having ongoing problems with low back pain and on December 27th, 2014, she fell as she was getting up off the commode and twisted her left foot.  Following her fall, she developed left burning sensation of the left knee, medial ankle, and dorsum of the foot and severe edema/discoloration of the foot.  She went to the emergency department for the pain where imaging did not reveal any fracture.  Her pain continued to be debilitating and she was eventually in a wheelchair because of low back pain and completed PT which improved her leg strength.  She saw podiatry who did some foot injections and also saw orthopaedics and received knee injections without any benefit.  She was found to have ruptured disc at L4-L5 per patient and ultimately underwent lumbar decompression, however MRI lumbar spine from December 2014 does not show any disc protrusion.  She endorses left leg weakness and walks independently.  She does not have numbness/tinging and denies any discomfort of the right leg.    She is currently taking hydrocodone twice daily which alleviates the pain.    She complains of bilateral hand  burning sensation over the top of her hands which is intermittent.  Denies any exacerbating or alleviating factors.  No associated weakness.  Out-side paper records, electronic medical record, and images have been reviewed where available and summarized as:  MRI lumbar spine 04/28/2013: Minimal lumbar degenerative change. No disc protrusion or neural impingement. Right adnexal cyst not likely of any clinical significance.  Lab Results  Component Value Date   TSH 1.002 04/03/2014   Lab Results  Component Value Date   HGBA1C 5.8* 12/25/2013   Lab Results  Component Value Date   TFTDDUKG25 4270* 04/03/2014     Past Medical History  Diagnosis Date  . Paroxysmal atrial fibrillation     Palpitations; maintained on flecainide; -normal coronary angiography and 1999; negative stress nuclear study in 11/2005  . Gastroesophageal reflux disease   . Hearing loss     mild  . Tobacco abuse, in remission     10-pack-years; quit in 2001  . 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 Hyperlipidemia   . Ovarian cyst 2008  . PONV (postoperative nausea and vomiting)   . Arthritis   . Irregular heartbeat   . Gastroparesis     Past Surgical History  Procedure Laterality Date  . Partial colectomy  2006    adenocarcinoma  . Abdominal hysterectomy  1983    partial  . Cosmetic surgery  09/2007  . Colonoscopy  03/25/2012    [name], MD  . Tonsillectomy    . Cholecystectomy    . Colon surgery  2006  Colon cancer     Medications:  Current Outpatient Prescriptions on File Prior to Visit  Medication Sig Dispense Refill  . apixaban (ELIQUIS) 5 MG TABS tablet Take 1 tablet (5 mg total) by mouth 2 (two) times daily. 60 tablet 6  . Calcium Citrate (CITRACAL PO) Take 1 tablet by mouth every morning.     . flecainide (TAMBOCOR) 100 MG tablet Take 1 tablet (100 mg total) by mouth every 12 (twelve) hours. 180 tablet 1  . HYDROcodone-acetaminophen (NORCO) 10-325 MG  per tablet Take 1 tablet by mouth 2 (two) times daily as needed. 60 tablet 0  . Lactulose SOLN Take 30 mLs by mouth at bedtime.     Earney Navy Bicarbonate (ZEGERID) 20-1100 MG CAPS capsule Take 1 capsule by mouth daily before breakfast.    . rosuvastatin (CRESTOR) 40 MG tablet Take 1 tablet (40 mg total) by mouth daily. 30 tablet 6  . vitamin B-12 (CYANOCOBALAMIN) 1000 MCG tablet Take 1,000 mcg by mouth daily.     No current facility-administered medications on file prior to visit.    Allergies:  Allergies  Allergen Reactions  . Neurontin [Gabapentin]     Felt "loopy"    Family History: Family History  Problem Relation Age of Onset  . Heart disease Mother   . Stroke Mother   . Heart attack Mother   . Cancer Father     lung  . Heart disease Father   . Hypertension Father   . Cancer Brother     rectal  . Peripheral vascular disease Brother     Social History: History   Social History  . Marital Status: Married    Spouse Name: N/A    Number of Children: N/A  . Years of Education: N/A   Occupational History  . Bookkeeper    Social History Main Topics  . Smoking status: Former Smoker -- 0.30 packs/day for 40 years    Quit date: 03/25/2002  . Smokeless tobacco: Never Used     Comment: Quit smoking in 2001  . Alcohol Use: No  . Drug Use: No  . Sexual Activity: Yes    Birth Control/ Protection: Surgical   Other Topics Concern  . Not on file   Social History Narrative   Lives with husband in a one story home.  Has 3 children.  Retired from Medical sales representative work.  Education: some college.    Review of Systems:  CONSTITUTIONAL: No fevers, chills, night sweats, or weight loss.   EYES: No visual changes or eye pain ENT: No hearing changes.  No history of nose bleeds.   RESPIRATORY: No cough, wheezing and shortness of breath.   CARDIOVASCULAR: Negative for chest pain, and palpitations.   GI: Negative for abdominal discomfort, blood in stools or black stools.  No  recent change in bowel habits.   GU:  No history of incontinence.   MUSCLOSKELETAL: +history of joint pain or swelling.  No myalgias.   SKIN: Negative for lesions, rash, and itching.   HEMATOLOGY/ONCOLOGY: Negative for prolonged bleeding, bruising easily, and swollen nodes.  No history of cancer.   ENDOCRINE: Negative for cold or heat intolerance, polydipsia or goiter.   PSYCH:  +depression or anxiety symptoms.   NEURO: As Above.   Vital Signs:  BP 100/64 mmHg  Pulse 64  Ht 5\' 2"  (1.575 m)  Wt 112 lb 6 oz (50.973 kg)  BMI 20.55 kg/m2  SpO2 97%   General Medical Exam:   General:  Well appearing, comfortable.  Eyes/ENT: see cranial nerve examination.   Neck: No masses appreciated.  Full range of motion without tenderness.  No carotid bruits. Respiratory:  Clear to auscultation, good air entry bilaterally.   Cardiac:  Regular rate and rhythm, no murmur.   Extremities:  No deformities, edema, or skin discoloration.  Skin:  No rashes or lesions.  Neurological Exam: MENTAL STATUS including orientation to time, place, person, recent and remote memory, attention span and concentration, language, and fund of knowledge is normal.  Speech is not dysarthric.  CRANIAL NERVES: II:  No visual field defects.  Unremarkable fundi.   III-IV-VI: Pupils equal round and reactive to light.  Normal conjugate, extra-ocular eye movements in all directions of gaze.  No nystagmus.  No ptosis.   V:  Normal facial sensation.    VII:  Normal facial symmetry and movements.  No pathologic facial reflexes.  VIII:  Normal hearing and vestibular function.   IX-X:  Normal palatal movement.   XI:  Normal shoulder shrug and head rotation.   XII:  Normal tongue strength and range of motion, no deviation or fasciculation.  MOTOR:  No atrophy, fasciculations or abnormal movements.  No pronator drift.  Tone is normal.   Pain to palpation over the left foot and knee.  Right Upper Extremity:    Left Upper Extremity:     Deltoid  5/5   Deltoid  5/5   Biceps  5/5   Biceps  5/5   Triceps  5/5   Triceps  5/5   Wrist extensors  5/5   Wrist extensors  5/5   Wrist flexors  5/5   Wrist flexors  5/5   Finger extensors  5/5   Finger extensors  5/5   Finger flexors  5/5   Finger flexors  5/5   Dorsal interossei  5/5   Dorsal interossei  5/5   Abductor pollicis  5/5   Abductor pollicis  5/5   Tone (Ashworth scale)  0  Tone (Ashworth scale)  0   Right Lower Extremity:    Left Lower Extremity:    Hip flexors  5/5   Hip flexors  5/5   Hip extensors  5/5   Hip extensors  5/5   Knee flexors  5/5   Knee flexors  5/5   Knee extensors  5/5   Knee extensors  5/5   Dorsiflexors  5/5   Dorsiflexors  5/5   Plantarflexors  5/5   Plantarflexors  5/5   Inversion 5/5  Inversion 5/5  Eversion 5/5  Eversion 5-/5  Toe extensors  5/5   Toe extensors  5/5   Toe flexors  5/5   Toe flexors  5/5   Tone (Ashworth scale)  0  Tone (Ashworth scale)  0   MSRs:  Right                                                                 Left brachioradialis 2+  brachioradialis 2+  biceps 2+  biceps 2+  triceps 2+  triceps 2+  patellar 2+  patellar 1+  ankle jerk 0  ankle jerk 0  Hoffman no  Hoffman no  plantar response down  plantar response down   SENSORY:  Normal and symmetric perception of light touch,  pinprick, vibration, and proprioception.  Romberg's sign absent.   COORDINATION/GAIT: Normal finger-to- nose-finger and heel-to-shin.  Intact rapid alternating movements bilaterally.  Able to rise from a chair without using arms.  Gait narrow based and stable. Tandem and stressed gait intact.    IMPRESSION: Mrs. Colter is a 74 year-old female presenting for evaluation of painful paresthesias involving her left leg and intermittent pain of the dorsal surface of her hands bilaterally.  Her examination is non-focal and there is no sensory deficits on exam.  Reflexes are subdued at the ankles bilaterally, which is likely age-appropriate and  subtle weakness with left foot eversion seems to be more pain-limited.  I explained that her paresthesias do not conform to a dermatomal or cutaneous nerve distribution, so we will obtain EMG of the left arm and leg to better localize her symptoms.  If there is no evidence of nerve injury on her EMG, chronic pain syndrome such as reflex sympathetic dystrophy needs to be considered since she has persistent pain following injury.   PLAN/RECOMMENDATIONS:  1.  EMG of the left arm and leg, add sensory studies of the right leg if needed for comparison 2.  Further recommendation based on the result of the EMG   The duration of this appointment visit was 45 minutes of face-to-face time with the patient.  Greater than 50% of this time was spent in counseling, explanation of diagnosis, planning of further management, and coordination of care.   Thank you for allowing me to participate in patient's care.  If I can answer any additional questions, I would be pleased to do so.    Sincerely,    Donika K. Posey Pronto, DO

## 2014-06-21 ENCOUNTER — Ambulatory Visit (INDEPENDENT_AMBULATORY_CARE_PROVIDER_SITE_OTHER): Payer: Medicare Other | Admitting: Cardiology

## 2014-06-21 ENCOUNTER — Encounter: Payer: Self-pay | Admitting: Cardiology

## 2014-06-21 VITALS — BP 108/64 | HR 67 | Ht 62.0 in | Wt 113.0 lb

## 2014-06-21 DIAGNOSIS — I480 Paroxysmal atrial fibrillation: Secondary | ICD-10-CM

## 2014-06-21 DIAGNOSIS — E785 Hyperlipidemia, unspecified: Secondary | ICD-10-CM | POA: Diagnosis not present

## 2014-06-21 NOTE — Patient Instructions (Signed)
Your physician wants you to follow-up in: 1 year with Dr. Branch  You will receive a reminder letter in the mail two months in advance. If you don't receive a letter, please call our office to schedule the follow-up appointment.  Your physician recommends that you continue on your current medications as directed. Please refer to the Current Medication list given to you today.  Thank you for choosing [facility]!!   

## 2014-06-21 NOTE — Progress Notes (Signed)
Clinical Summary Ms. Foland is a 74 y.o.female seen today for follow up of the following medical problems.   1. Afib  - on flecanide, denies any recent palpitations  - on eliquis for stroke prevention, denies any bleeding issues  2. HL - followed by pcp - crestor increased to 40mg  daily recently by pcp  Past Medical History  Diagnosis Date  . Paroxysmal atrial fibrillation     Palpitations; maintained on flecainide; -normal coronary angiography and 1999; negative stress nuclear study in 11/2005  . Gastroesophageal reflux disease   . Hearing loss     mild  . Tobacco abuse, in remission     10-pack-years; quit in 2001  . 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 Hyperlipidemia   . Ovarian cyst 2008  . PONV (postoperative nausea and vomiting)   . Arthritis   . Irregular heartbeat   . Gastroparesis      Allergies  Allergen Reactions  . Neurontin [Gabapentin]     Felt "loopy"     Current Outpatient Prescriptions  Medication Sig Dispense Refill  . apixaban (ELIQUIS) 5 MG TABS tablet Take 1 tablet (5 mg total) by mouth 2 (two) times daily. 60 tablet 6  . Calcium Citrate (CITRACAL PO) Take 1 tablet by mouth every morning.     . flecainide (TAMBOCOR) 100 MG tablet Take 1 tablet (100 mg total) by mouth every 12 (twelve) hours. 180 tablet 1  . HYDROcodone-acetaminophen (NORCO) 10-325 MG per tablet Take 1 tablet by mouth 2 (two) times daily as needed. 60 tablet 0  . Lactulose SOLN Take 30 mLs by mouth at bedtime.     Earney Navy Bicarbonate (ZEGERID) 20-1100 MG CAPS capsule Take 1 capsule by mouth daily before breakfast.    . rosuvastatin (CRESTOR) 40 MG tablet Take 1 tablet (40 mg total) by mouth daily. 30 tablet 6   No current facility-administered medications for this visit.     Past Surgical History  Procedure Laterality Date  . Partial colectomy  2006    adenocarcinoma  . Abdominal hysterectomy  1983    partial  . Cosmetic  surgery  09/2007  . Colonoscopy  03/25/2012    [name], MD  . Tonsillectomy    . Cholecystectomy    . Colon surgery  2006    Colon cancer     Allergies  Allergen Reactions  . Neurontin [Gabapentin]     Felt "loopy"      Family History  Problem Relation Age of Onset  . Heart disease Mother   . Stroke Mother   . Heart attack Mother   . Cancer Father     lung  . Heart disease Father   . Hypertension Father   . Cancer Brother     rectal  . Peripheral vascular disease Brother   . Healthy Son      Social History Ms. Michelsen reports that she quit smoking about 12 years ago. She has never used smokeless tobacco. Ms. Sheriff reports that she does not drink alcohol.   Review of Systems CONSTITUTIONAL: No weight loss, fever, chills, weakness or fatigue.  HEENT: Eyes: No visual loss, blurred vision, double vision or yellow sclerae.No hearing loss, sneezing, congestion, runny nose or sore throat.  SKIN: No rash or itching.  CARDIOVASCULAR: per HPI RESPIRATORY: No shortness of breath, cough or sputum.  GASTROINTESTINAL: No anorexia, nausea, vomiting or diarrhea. No abdominal pain or blood.  GENITOURINARY: No burning  on urination, no polyuria NEUROLOGICAL: No headache, dizziness, syncope, paralysis, ataxia, numbness or tingling in the extremities. No change in bowel or bladder control.  MUSCULOSKELETAL: No muscle, back pain, joint pain or stiffness.  LYMPHATICS: No enlarged nodes. No history of splenectomy.  PSYCHIATRIC: No history of depression or anxiety.  ENDOCRINOLOGIC: No reports of sweating, cold or heat intolerance. No polyuria or polydipsia.  Marland Kitchen   Physical Examination p 67 bp 108/64 Wt 113 lbs BMI 21 Gen: resting comfortably, no acute distress HEENT: no scleral icterus, pupils equal round and reactive, no palptable cervical adenopathy,  CV: RRR, no m/r/g, no JVD, no carotid bruits Resp: Clear to auscultation bilaterally GI: abdomen is soft, non-tender,  non-distended, normal bowel sounds, no hepatosplenomegaly MSK: extremities are warm, no edema.  Skin: warm, no rash Neuro:  no focal deficits Psych: appropriate affect   Diagnostic Studies 03/2013 MPI  Overall low risk exercise Cardiolite. Equivocal ST segment abnormalities were noted, mild chest pain at peak exertion, otherwise no definite arrhythmias. Perfusion imaging is consistent with breast attenuation affecting the mid to apical anteroseptal wall, no definite ischemic defects. LVEF is 71% with normal volumes and no wall motion abnormalities.  08/2006 Echo Left ventricular cavity size was normal. EF was 60%. There were no regional wall motion abnormalities. Mitral valve was mildly thickened. There was trivial MR. Left atrium and right-sided cardiac chambers were normal. There was no evidence of pulmonary hypertension. There was mild TR. Aortic valve was trileaflet, minimally sclerotic. There was trivial aortic insufficiency. Subcostal imaging revealed no atrial septal defect, no source of embolus and no effusion.  M-mode measurements included an aortic diameter of 27 mm, left atrial diameter 32 mm, septal thickness 8 mm, LV diastolic dimension 44 mm, systolic dimension 31 mm.  FINAL IMPRESSION: 1. Normal left ventricle, ejection fraction 60%. 2. Trivial mitral regurgitation. 3. Trivial aortic insufficiency. 4. Mild tricuspid regurgitation. 5. Normal atria and right ventricle. 6. No pericardial effusion.   Assessment and Plan  1. Afib  - no current symptoms, continue flecanide  - continue eliquis for stroke prevention  2. HL - recent increase in crestor by pcp, continue to follow lipids   F/u 1 year      Arnoldo Lenis, M.D..

## 2014-06-22 ENCOUNTER — Encounter: Payer: Self-pay | Admitting: Family Medicine

## 2014-06-22 ENCOUNTER — Other Ambulatory Visit: Payer: Self-pay | Admitting: Cardiology

## 2014-06-22 ENCOUNTER — Ambulatory Visit (INDEPENDENT_AMBULATORY_CARE_PROVIDER_SITE_OTHER): Payer: Medicare Other | Admitting: Family Medicine

## 2014-06-22 VITALS — BP 122/76 | Temp 98.3°F | Ht 62.0 in | Wt 114.0 lb

## 2014-06-22 DIAGNOSIS — J329 Chronic sinusitis, unspecified: Secondary | ICD-10-CM | POA: Diagnosis not present

## 2014-06-22 DIAGNOSIS — J310 Chronic rhinitis: Secondary | ICD-10-CM

## 2014-06-22 MED ORDER — AMOXICILLIN 500 MG PO CAPS: 500.0000 mg | ORAL_CAPSULE | Freq: Three times a day (TID) | ORAL | Status: DC

## 2014-06-22 NOTE — Progress Notes (Signed)
   Subjective:    Patient ID: [name], female    DOB: [birth year], 74 y.o.   MRN: [id number]  HPI Cough cong estion and headache  Low gr fever  Not much cough  A lot of pressure  Throat sore  inflammed  Not responsive to over-the-counter meds.  Headache frontal worse with change of position. Cough generally nonproductive.   No significant fever.   Review of Systems No vomiting no diarrhea no rash no achiness ROS otherwise negative    Objective:   Physical Exam Alert hydration good. HEENT moderate nasal congestion frontal tenderness pharynx erythematous neck supple mild malaise lungs clear heart regular in rhythm.       Assessment & Plan:  Impression acute rhinosinusitis plan antibiotics prescribed. Symptomatically care discussed. Warning signs discussed. WSL

## 2014-06-25 ENCOUNTER — Ambulatory Visit (HOSPITAL_COMMUNITY): Payer: Medicare Other

## 2014-06-28 ENCOUNTER — Ambulatory Visit (HOSPITAL_COMMUNITY)
Admission: RE | Admit: 2014-06-28 | Discharge: 2014-06-28 | Disposition: A | Discharge status: Home / Self Care | Payer: Medicare Other | Source: Ambulatory Visit | Attending: Family Medicine | Admitting: Family Medicine

## 2014-06-28 DIAGNOSIS — Z1231 Encounter for screening mammogram for malignant neoplasm of breast: Secondary | ICD-10-CM | POA: Insufficient documentation

## 2014-07-04 ENCOUNTER — Ambulatory Visit (INDEPENDENT_AMBULATORY_CARE_PROVIDER_SITE_OTHER): Payer: Medicare Other | Admitting: Neurology

## 2014-07-04 DIAGNOSIS — M5417 Radiculopathy, lumbosacral region: Secondary | ICD-10-CM

## 2014-07-04 DIAGNOSIS — R202 Paresthesia of skin: Secondary | ICD-10-CM

## 2014-07-04 DIAGNOSIS — M79605 Pain in left leg: Secondary | ICD-10-CM | POA: Diagnosis not present

## 2014-07-04 DIAGNOSIS — M5412 Radiculopathy, cervical region: Secondary | ICD-10-CM

## 2014-07-04 DIAGNOSIS — G9050 Complex regional pain syndrome I, unspecified: Secondary | ICD-10-CM

## 2014-07-04 NOTE — Procedures (Signed)
[facility]  [place], [place]  [place], [place] [address] Tel: [phone] Fax:  [phone] Test Date:  07/04/2014  Patient: [name] DOB: 1941-02-03 Physician: Narda Amber  Sex: Female Height: 5\' 2"  Ref Phys: Narda Amber  ID#: [id number] Temp: 35.3C Technician: Laureen Ochs R. NCS T.   Patient Complaints: Patient is a 74 year old female here for evaluation of left foot pain after falling one year go and intermittent painful bilateral hand paresthesias on dorsum of hands.  NCV & EMG Findings: Extensive electrodiagnostic testing of the left upper extremity, left lower extremity, and she'll studies of the right upper extremity shows:  1. Left median, ulnar, radial, and palmar studies are within normal limits. 2. Left median and ulnar motor responses are within normal limits. 3. Left sural and superficial peroneal sensory responses are within normal limits. 4. Left peroneal and tibial motor responses are within normal limits. 5. In the arms, mild chronic motor axon loss changes are seen affecting the biceps and pronator teres muscles on the left. These findings are not present in the right upper extremity. 6. In the legs, chronic motor axon loss changes are seen affecting the L4-L5 myotomes, without accompanied active denervation.  Impression: 1. Chronic C6 radiculopathy affecting the left upper extremity, very mild in degree electrically. 2. Chronic L4-L5 radiculopathy affecting the left lower extremity, very mild in degree electrically and most likely residuals from her prior lumbar surgery. 3. There is no evidence of a generalized sensorimotor polyneuropathy affecting the left side.   ___________________________ Narda Amber    Nerve Conduction Studies Anti Sensory Summary Table   Stim Site NR Peak (ms) Norm Peak (ms) P-T Amp (V) Norm P-T Amp  Left Median Anti Sensory (2nd Digit)  Wrist    3.2 <3.8 37.7 >10  Left Radial Anti Sensory (Base 1st  Digit)  Wrist    2.3 <2.8 38.9 >10  Left Sup Peroneal Anti Sensory (Ant Lat Mall)  12 cm    3.0 <4.6 6.2 >3  Left Sural Anti Sensory (Lat Mall)  Calf    3.8 <4.6 6.5 >3  Left Ulnar Anti Sensory (5th Digit)  Wrist    3.0 <3.2 46.1 >5   Motor Summary Table   Stim Site NR Onset (ms) Norm Onset (ms) O-P Amp (mV) Norm O-P Amp Site1 Site2 Delta-0 (ms) Dist (cm) Vel (m/s) Norm Vel (m/s)  Left Median Motor (Abd Poll Brev)  Wrist    3.0 <4.0 8.2 >5 Elbow Wrist 5.0 25.0 50 >50  Elbow    8.0  7.5         Left Peroneal Motor (Ext Dig Brev)  Ankle    3.4 <6.0 2.6 >2.5 B Fib Ankle 6.3 28.0 44 >40  B Fib    9.7  2.5  Poplt B Fib 2.3 10.0 43 >40  Poplt    12.0  2.5         Left Peroneal TA Motor (Tib Ant)  Fib Head    2.2 <4.5 3.1 >3 Poplit Fib Head 1.9 10.0 53 >40  Poplit    4.1  3.0         Left Tibial Motor (Abd Hall Brev)  Ankle    3.4 <6.0 13.2 >4 Knee Ankle 8.2 35.0 43 >40  Knee    11.6  9.6         Left Ulnar Motor (Abd Dig Minimi)  Wrist    2.0 <3.1 7.6 >7 B Elbow Wrist 3.6 21.0  58 >50  B Elbow    5.6  7.2  A Elbow B Elbow 1.9 10.0 53 >50  A Elbow    7.5  6.8          Comparison Summary Table   Stim Site NR Peak (ms) Norm Peak (ms) P-T Amp (V) Site1 Site2 Delta-P (ms) Norm Delta (ms)  Left Median/Ulnar Palm Comparison (Wrist - 8cm)  Median Palm    2.0 <2.2 42.8 Median Palm Ulnar Palm 0.1   Ulnar Palm    1.9 <2.2 22.2       EMG   Side Muscle Ins Act Fibs Psw Fasc Number Recrt Dur Dur. Amp Amp. Poly Poly. Comment  Left 1stDorInt Nml Nml Nml Nml Nml Nml Nml Nml Nml Nml Nml Nml N/A  Left Ext Indicis Nml Nml Nml Nml Nml Nml Nml Nml Nml Nml Nml Nml N/A  Left PronatorTeres Nml Nml Nml Nml 1- Mod-R Few 1+ Few 1+ Nml Nml N/A  Left Biceps Nml Nml Nml Nml 1- Mod-R Few 1+ Few 1+ Nml Nml N/A  Left Triceps Nml Nml Nml Nml Nml Nml Nml Nml Nml Nml Nml Nml N/A  Left Deltoid Nml Nml Nml Nml Nml Nml Nml Nml Nml Nml Nml Nml N/A  Left AntTibialis Nml Nml Nml Nml 1- Mod-R Few 1+ Few 1+ Nml Nml N/A    Left Gastroc Nml Nml Nml Nml Nml Nml Nml Nml Nml Nml Nml Nml N/A  Left Flex Dig Long Nml Nml Nml Nml 1- Mod-R Few 1+ Few 1+ Nml Nml N/A  Left RectFemoris Nml Nml Nml Nml 1- Mod Few 1+ Few 1+ Nml Nml N/A  Left GluteusMed Nml Nml Nml Nml Nml Nml Nml Nml Nml Nml Nml Nml N/A  Left AdductorLong Nml Nml Nml Nml 1- Mod-R Few 1+ Few 1+ Nml Nml N/A  Right PronatorTeres Nml Nml Nml Nml Nml Nml Nml Nml Nml Nml Nml Nml N/A  Right Biceps Nml Nml Nml Nml Nml Nml Nml Nml Nml Nml Nml Nml N/A      Waveforms:

## 2014-08-02 DIAGNOSIS — M5126 Other intervertebral disc displacement, lumbar region: Secondary | ICD-10-CM | POA: Diagnosis not present

## 2014-08-02 DIAGNOSIS — M79672 Pain in left foot: Secondary | ICD-10-CM | POA: Diagnosis not present

## 2014-08-02 DIAGNOSIS — M25562 Pain in left knee: Secondary | ICD-10-CM | POA: Diagnosis not present

## 2014-08-02 DIAGNOSIS — M47816 Spondylosis without myelopathy or radiculopathy, lumbar region: Secondary | ICD-10-CM | POA: Diagnosis not present

## 2014-08-22 ENCOUNTER — Telehealth: Payer: Self-pay | Admitting: Family Medicine

## 2014-08-22 DIAGNOSIS — Z79899 Other long term (current) drug therapy: Secondary | ICD-10-CM

## 2014-08-22 DIAGNOSIS — Z139 Encounter for screening, unspecified: Secondary | ICD-10-CM

## 2014-08-22 DIAGNOSIS — Z7901 Long term (current) use of anticoagulants: Secondary | ICD-10-CM

## 2014-08-22 DIAGNOSIS — E785 Hyperlipidemia, unspecified: Secondary | ICD-10-CM

## 2014-08-22 NOTE — Telephone Encounter (Signed)
Pt would like to know if she needs BW for her appt on the 26th of Apr  Last labs 04/03/14 TSH, T4, Free, Vit B12   03/19/14  Lip, Hep Func  Please call pt either way if she needs these labs

## 2014-08-22 NOTE — Telephone Encounter (Signed)
Lipid, liver, CBC-hyperlipidemia, chronic use of anticoagulants

## 2014-08-22 NOTE — Telephone Encounter (Signed)
[facility] bw orders ready. Need to let pt know to go to [facility].

## 2014-08-24 NOTE — Addendum Note (Signed)
Addended by: Carmelina Noun on: 08/24/2014 12:01 PM   Modules accepted: Orders

## 2014-08-24 NOTE — Telephone Encounter (Signed)
bw orders ready pt notified. Pt requesting her blood sugar added to test. Test added.

## 2014-08-28 DIAGNOSIS — Z139 Encounter for screening, unspecified: Secondary | ICD-10-CM | POA: Diagnosis not present

## 2014-08-28 DIAGNOSIS — E785 Hyperlipidemia, unspecified: Secondary | ICD-10-CM | POA: Diagnosis not present

## 2014-08-28 DIAGNOSIS — Z7901 Long term (current) use of anticoagulants: Secondary | ICD-10-CM | POA: Diagnosis not present

## 2014-08-28 DIAGNOSIS — Z79899 Other long term (current) drug therapy: Secondary | ICD-10-CM | POA: Diagnosis not present

## 2014-08-29 LAB — HEPATIC FUNCTION PANEL
ALBUMIN: 4.2 g/dL (ref 3.5–4.8)
ALT: 13 IU/L (ref 0–32)
AST: 18 IU/L (ref 0–40)
Alkaline Phosphatase: 65 IU/L (ref 39–117)
Bilirubin Total: 0.4 mg/dL (ref 0.0–1.2)
Bilirubin, Direct: 0.11 mg/dL (ref 0.00–0.40)
Total Protein: 6.1 g/dL (ref 6.0–8.5)

## 2014-08-29 LAB — CBC WITH DIFFERENTIAL/PLATELET
Basophils Absolute: 0 10*3/uL (ref 0.0–0.2)
Basos: 0 %
EOS ABS: 0.1 10*3/uL (ref 0.0–0.4)
Eos: 1 %
HCT: 37.7 % (ref 34.0–46.6)
Hemoglobin: 12.4 g/dL (ref 11.1–15.9)
Immature Grans (Abs): 0 10*3/uL (ref 0.0–0.1)
Immature Granulocytes: 0 %
Lymphocytes Absolute: 2.6 10*3/uL (ref 0.7–3.1)
Lymphs: 40 %
MCH: 32.6 pg (ref 26.6–33.0)
MCHC: 32.9 g/dL (ref 31.5–35.7)
MCV: 99 fL — ABNORMAL HIGH (ref 79–97)
MONOS ABS: 0.6 10*3/uL (ref 0.1–0.9)
Monocytes: 9 %
NEUTROS PCT: 50 %
Neutrophils Absolute: 3.2 10*3/uL (ref 1.4–7.0)
PLATELETS: 301 10*3/uL (ref 150–379)
RBC: 3.8 x10E6/uL (ref 3.77–5.28)
RDW: 12.8 % (ref 12.3–15.4)
WBC: 6.5 10*3/uL (ref 3.4–10.8)

## 2014-08-29 LAB — LIPID PANEL
CHOLESTEROL TOTAL: 166 mg/dL (ref 100–199)
Chol/HDL Ratio: 2.4 ratio units (ref 0.0–4.4)
HDL: 70 mg/dL (ref >39)
LDL CALC: 73 mg/dL (ref 0–99)
TRIGLYCERIDES: 115 mg/dL (ref 0–149)
VLDL CHOLESTEROL CAL: 23 mg/dL (ref 5–40)

## 2014-08-29 LAB — GLUCOSE, RANDOM: Glucose: 92 mg/dL (ref 65–99)

## 2014-09-04 ENCOUNTER — Encounter: Payer: Self-pay | Admitting: Family Medicine

## 2014-09-04 ENCOUNTER — Ambulatory Visit (INDEPENDENT_AMBULATORY_CARE_PROVIDER_SITE_OTHER): Payer: Medicare Other | Admitting: Family Medicine

## 2014-09-04 VITALS — BP 100/66 | Ht 62.0 in | Wt 112.1 lb

## 2014-09-04 DIAGNOSIS — R490 Dysphonia: Secondary | ICD-10-CM | POA: Diagnosis not present

## 2014-09-04 DIAGNOSIS — L03114 Cellulitis of left upper limb: Secondary | ICD-10-CM | POA: Diagnosis not present

## 2014-09-04 DIAGNOSIS — E785 Hyperlipidemia, unspecified: Secondary | ICD-10-CM

## 2014-09-04 DIAGNOSIS — T148XXA Other injury of unspecified body region, initial encounter: Secondary | ICD-10-CM

## 2014-09-04 DIAGNOSIS — T148 Other injury of unspecified body region: Secondary | ICD-10-CM

## 2014-09-04 DIAGNOSIS — R7309 Other abnormal glucose: Secondary | ICD-10-CM

## 2014-09-04 DIAGNOSIS — R7303 Prediabetes: Secondary | ICD-10-CM

## 2014-09-04 LAB — POCT GLYCOSYLATED HEMOGLOBIN (HGB A1C): Hemoglobin A1C: 5.3

## 2014-09-04 MED ORDER — AMOXICILLIN-POT CLAVULANATE 875-125 MG PO TABS: 1.0000 | ORAL_TABLET | Freq: Two times a day (BID) | ORAL | Status: DC

## 2014-09-04 NOTE — Progress Notes (Signed)
   Subjective:    Patient ID: [name], female    DOB: 1940/10/09, 74 y.o.   MRN: [id number]  Hyperlipidemia This is a chronic problem. The current episode started more than 1 year ago. The problem is controlled. Recent lipid tests were reviewed and are normal. There are no known factors aggravating her hyperlipidemia. Pertinent negatives include no chest pain. Treatments tried: Crestor. The current treatment provides significant improvement of lipids. There are no compliance problems.  There are no known risk factors for coronary artery disease.   Patient states that she thinks the Crestor is making her hands burn a lot. Patient states that she has a spot on her left arm that looks infected. A dog jumped up on her. Patient states that she has been having some dizzy spells also and she is concerned about diabetes.  Patient relates at times feeling weak and woozy and dizzy and feels like she's got a pass out she thought she has diabetes but she also admits that she doesn't eat lunch  Review of Systems  Constitutional: Negative for activity change, appetite change and fatigue.  HENT: Negative for congestion.   Respiratory: Negative for cough.   Cardiovascular: Negative for chest pain.  Gastrointestinal: Negative for abdominal pain.  Endocrine: Negative for polydipsia and polyphagia.  Neurological: Negative for weakness.  Psychiatric/Behavioral: Negative for confusion.       Objective:   Physical Exam  Constitutional: She appears well-nourished. No distress.  Cardiovascular: Normal rate and normal heart sounds.   No murmur heard. Pulmonary/Chest: Effort normal and breath sounds normal. No respiratory distress.  Musculoskeletal: She exhibits no edema.  Lymphadenopathy:    She has no cervical adenopathy.  Neurological: She is alert. She exhibits normal muscle tone.  Psychiatric: Her behavior is normal.  Vitals reviewed.  Cellulitis of the left arm is noted not severe         Assessment & Plan:  1. Prediabetes A1c overall in good control watch diet stay physically active I do not feel the patient is having diabetes issues for her dizziness I think it's because she only needs at breakfast and then again at suppertime I told her she needs be a small lunch or snack to avoid hypoglycemia - POCT glycosylated hemoglobin (Hb A1C)  2. Hyperlipidemia Cholesterol issues overall doing well on her current medicine she once to continue it but she states she will let us know if the cost becomes too much. She also states that at times her hands burn this could be related to the medicine if she would like to switch to a different cholesterol medicine we can but she does not one to currently  3. Hoarseness With hoarseness I believe she patient she should go ahead and be seen by ENT to evaluate for reflux hoarseness or possible vocal cord dystonia - Ambulatory referral to ENT  4. Scratch She does have a scratch on her left arm cellulitis I believe the patient on a get a tetanus shot unfortunately her insurance doesn't cover it we advised the patient she could get here and possibly pay for it out of pocket or she could go to the health department  5. Cellulitis of arm, left She has cellulitis of the arm antibiotic twice a day for the next week should get this under control if the antibiotic causes nausea or vomiting she needs to let us know greater than 25 minutes spent with patient covering a multitude of problems

## 2014-09-04 NOTE — Patient Instructions (Signed)
As part of today's visit a referral has been made. This is a process that is handled by our clinical referral specialists. This process requires that we send your medical information to the specialists for their review before they will issue you an appointment. Unfortunately this does take time and much of this process is under the responsibility of the specialists. Our referral specialist will make certain that your insurance company is notified as well as the physician group that we are referring you to for your problem. Emergent referrals are made as quick as possible. Most standard referrals often take 7-10 days before we hear from the specialists office when they can see you. If you have not heard when your appointment is from us or the referral specialists within 7-10 days please call us regarding this referral.  

## 2014-09-11 ENCOUNTER — Ambulatory Visit (INDEPENDENT_AMBULATORY_CARE_PROVIDER_SITE_OTHER): Payer: Medicare Other

## 2014-09-11 DIAGNOSIS — Z23 Encounter for immunization: Secondary | ICD-10-CM

## 2014-09-26 DIAGNOSIS — M47816 Spondylosis without myelopathy or radiculopathy, lumbar region: Secondary | ICD-10-CM | POA: Diagnosis not present

## 2014-09-26 DIAGNOSIS — M9983 Other biomechanical lesions of lumbar region: Secondary | ICD-10-CM | POA: Diagnosis not present

## 2014-09-26 DIAGNOSIS — M25562 Pain in left knee: Secondary | ICD-10-CM | POA: Diagnosis not present

## 2014-09-26 DIAGNOSIS — M5136 Other intervertebral disc degeneration, lumbar region: Secondary | ICD-10-CM | POA: Diagnosis not present

## 2014-10-04 ENCOUNTER — Ambulatory Visit (INDEPENDENT_AMBULATORY_CARE_PROVIDER_SITE_OTHER): Payer: Medicare Other | Admitting: Nurse Practitioner

## 2014-10-04 ENCOUNTER — Encounter: Payer: Self-pay | Admitting: Nurse Practitioner

## 2014-10-04 ENCOUNTER — Ambulatory Visit: Payer: Medicare Other | Admitting: Family Medicine

## 2014-10-04 VITALS — BP 122/80 | Temp 97.4°F | Ht 62.0 in | Wt 112.1 lb

## 2014-10-04 DIAGNOSIS — J029 Acute pharyngitis, unspecified: Secondary | ICD-10-CM

## 2014-10-04 MED ORDER — AMOXICILLIN-POT CLAVULANATE 875-125 MG PO TABS: 1.0000 | ORAL_TABLET | Freq: Two times a day (BID) | ORAL | Status: DC

## 2014-10-07 ENCOUNTER — Encounter: Payer: Self-pay | Admitting: Nurse Practitioner

## 2014-10-07 NOTE — Progress Notes (Signed)
Subjective:  Presents for c/o left sided sore throat that began yesterday. No fever, cough, headache or ear pain. No rash. Leaving on a flight this evening.  Objective:   BP 122/80 mmHg  Temp(Src) 97.4 F (36.3 C) (Oral)  Ht 5\' 2"  (1.575 m)  Wt 112 lb 2 oz (50.86 kg)  BMI 20.50 kg/m2 NAD. Alert, oriented. TMs retracted, no erythema. Pharynx no erythema, PND noted. Neck supple with mild anterior adenopathy. Lungs clear. Heart RRR.   Assessment: Acute pharyngitis, unspecified pharyngitis type  Plan:  Meds ordered this encounter  Medications  . amoxicillin-clavulanate (AUGMENTIN) 875-125 MG per tablet    Sig: Take 1 tablet by mouth 2 (two) times daily.    Dispense:  20 tablet    Refill:  0    Order Specific Question:  Supervising Provider    Answer:  Mikey Kirschner [[id number]]   OTC meds as directed for congestion. Warned patient about ear pressure with flying.  Return if symptoms worsen or fail to improve.

## 2014-10-18 ENCOUNTER — Ambulatory Visit (INDEPENDENT_AMBULATORY_CARE_PROVIDER_SITE_OTHER): Payer: Medicare Other | Admitting: Otolaryngology

## 2014-10-18 DIAGNOSIS — K219 Gastro-esophageal reflux disease without esophagitis: Secondary | ICD-10-CM | POA: Diagnosis not present

## 2014-10-18 DIAGNOSIS — R490 Dysphonia: Secondary | ICD-10-CM | POA: Diagnosis not present

## 2014-10-24 ENCOUNTER — Other Ambulatory Visit: Payer: Self-pay | Admitting: Cardiology

## 2014-11-15 ENCOUNTER — Ambulatory Visit (INDEPENDENT_AMBULATORY_CARE_PROVIDER_SITE_OTHER): Payer: Medicare Other | Admitting: Otolaryngology

## 2014-11-15 ENCOUNTER — Telehealth: Payer: Self-pay | Admitting: Family Medicine

## 2014-11-15 ENCOUNTER — Other Ambulatory Visit: Payer: Self-pay | Admitting: Family Medicine

## 2014-11-15 DIAGNOSIS — R490 Dysphonia: Secondary | ICD-10-CM

## 2014-11-15 DIAGNOSIS — K219 Gastro-esophageal reflux disease without esophagitis: Secondary | ICD-10-CM

## 2014-11-15 NOTE — Telephone Encounter (Signed)
Pt calling back to state she does NOT have any meds what so ever Can we get this done today. If not can we please call him to let him  Know so he can find out what to do about tonight

## 2014-11-15 NOTE — Telephone Encounter (Signed)
Pt is requesting a refill on her flecainide.  [facility]

## 2014-11-15 NOTE — Telephone Encounter (Signed)
Nurse's-in a situation like this please have the doctor who is on for that day handle the issue area please call this patient and explained why this wasn't filled. (Absolutely no way I could've filled it because I was on Thursday and I saw this message late in the evening )She may have one refill. This particular medicine is followed and managed by her cardiologist. The patient should call Dr. branches office in order to get additional refills. Thank you

## 2014-11-15 NOTE — Telephone Encounter (Signed)
This patients heart medication is managed by Dr. branch who is part of [facility] please have the pharmacy notify Dr. branch 4 refills

## 2014-11-20 ENCOUNTER — Telehealth: Payer: Self-pay | Admitting: Family Medicine

## 2014-11-20 NOTE — Telephone Encounter (Signed)
Please call cardiology. Let them know that this particular patient gets flecainide from Dr. Harl Bowie. Somehow the prescription started getting filled under my name. I am not the doctor following this condition. I would like for their office to take responsibility for this prescription in send in refills in Dr. Harl Bowie his name. Apparently the patient tried to correct this and somehow was told that this medicine is followed by [place] which it is not. If I need to speak with them directly then put them on the phone. Hopefully this is something that once the nurses call Dr. Harl Bowie his office at [facility] cardiology they can take care of this issue!!

## 2014-11-21 NOTE — Telephone Encounter (Signed)
Talked with someone at the front desk at [place]'s office. Discussed that Dr.Scott was not going to manage this med. She states pt walked in their office for a refill and bottle had dr. Bary Leriche name that's why refill was denied. Explained that he filled because she was completely out and that in the future it needed to be filled through cardiology if he wanted her to be on the med. Dr Nicki Reaper is not going to fill again.

## 2014-11-21 NOTE — Telephone Encounter (Signed)
LMRC

## 2014-11-26 NOTE — Telephone Encounter (Signed)
Spoke with patient and informed patient per Dr.Scott-Nurse's-She may have one refill. This particular medicine is followed and managed by her cardiologist. The patient should call Dr. branches office in order to get additional refills. Patient verbalized understanding.

## 2014-12-04 DIAGNOSIS — M47816 Spondylosis without myelopathy or radiculopathy, lumbar region: Secondary | ICD-10-CM | POA: Diagnosis not present

## 2014-12-04 DIAGNOSIS — M5126 Other intervertebral disc displacement, lumbar region: Secondary | ICD-10-CM | POA: Diagnosis not present

## 2014-12-12 ENCOUNTER — Other Ambulatory Visit: Payer: Self-pay | Admitting: Family Medicine

## 2015-01-25 ENCOUNTER — Other Ambulatory Visit: Payer: Self-pay

## 2015-01-25 MED ORDER — ROSUVASTATIN CALCIUM 40 MG PO TABS: 40.0000 mg | ORAL_TABLET | Freq: Every day | ORAL | Status: DC

## 2015-02-12 DIAGNOSIS — M25511 Pain in right shoulder: Secondary | ICD-10-CM | POA: Diagnosis not present

## 2015-02-13 ENCOUNTER — Other Ambulatory Visit: Payer: Self-pay | Admitting: Family Medicine

## 2015-02-13 ENCOUNTER — Other Ambulatory Visit: Payer: Self-pay | Admitting: *Deleted

## 2015-02-13 ENCOUNTER — Other Ambulatory Visit: Payer: Self-pay | Admitting: Cardiology

## 2015-02-13 MED ORDER — FLECAINIDE ACETATE 100 MG PO TABS: 100.0000 mg | ORAL_TABLET | Freq: Two times a day (BID) | ORAL | Status: DC

## 2015-02-13 NOTE — Telephone Encounter (Signed)
Needs refill on Flecainide sent to [facility] / [facility]g

## 2015-02-13 NOTE — Telephone Encounter (Signed)
This seems to be a reoccurring problem. In a very kind way please let the patient know that we are sending this request to her cardiologist. This medication would be best for the cardiologist to refill it. Also nurses please send the message to the cardiology office letting them know that we aren't looking to Dr. Harl Bowie to refill this medicine

## 2015-02-20 ENCOUNTER — Other Ambulatory Visit: Payer: Self-pay | Admitting: Cardiology

## 2015-02-27 ENCOUNTER — Telehealth: Payer: Self-pay | Admitting: Neurology

## 2015-02-27 ENCOUNTER — Telehealth: Payer: Self-pay | Admitting: Family Medicine

## 2015-02-27 DIAGNOSIS — E785 Hyperlipidemia, unspecified: Secondary | ICD-10-CM

## 2015-02-27 DIAGNOSIS — R7303 Prediabetes: Secondary | ICD-10-CM

## 2015-02-27 DIAGNOSIS — Z79899 Other long term (current) drug therapy: Secondary | ICD-10-CM

## 2015-02-27 NOTE — Telephone Encounter (Signed)
Pt called to request lab orders to be sent over for her upcoming appt. Last labs per epic were: lipid,hepatic,cbc,and glucose on 08/28/14

## 2015-02-27 NOTE — Telephone Encounter (Signed)
Pt called for nerve conductor test result/call back @ [phone]

## 2015-02-27 NOTE — Telephone Encounter (Signed)
Please advise 

## 2015-02-28 NOTE — Telephone Encounter (Signed)
Lipid, liver, fasting glucose

## 2015-02-28 NOTE — Telephone Encounter (Signed)
This was performed back in February.  Please let her know there is no evidence of a peripheral neuropathy, only very mild nerve impingement in the neck and lumbar spine, which in her low back is most likely residual findings from her previous lumbar surgery.  She most likely has chronic regional pain syndrome affecting her foot and should see pain management.  Please have her f/u with me if she would like to discuss.  Jelani Trueba K. Posey Pronto, DO

## 2015-03-01 ENCOUNTER — Telehealth: Payer: Self-pay | Admitting: Neurology

## 2015-03-01 NOTE — Telephone Encounter (Signed)
[facility] 03/01/15

## 2015-03-01 NOTE — Telephone Encounter (Signed)
Patient given results and instructions.   

## 2015-03-01 NOTE — Telephone Encounter (Signed)
[name]/ called to request her Nerve Conductor Test Results be sent to/[facility]/[address]/[place]/[id number]

## 2015-03-04 DIAGNOSIS — R7303 Prediabetes: Secondary | ICD-10-CM | POA: Diagnosis not present

## 2015-03-04 DIAGNOSIS — E785 Hyperlipidemia, unspecified: Secondary | ICD-10-CM | POA: Diagnosis not present

## 2015-03-04 DIAGNOSIS — Z79899 Other long term (current) drug therapy: Secondary | ICD-10-CM | POA: Diagnosis not present

## 2015-03-04 NOTE — Telephone Encounter (Signed)
Test mailed

## 2015-03-05 DIAGNOSIS — M47816 Spondylosis without myelopathy or radiculopathy, lumbar region: Secondary | ICD-10-CM | POA: Diagnosis not present

## 2015-03-05 DIAGNOSIS — M5126 Other intervertebral disc displacement, lumbar region: Secondary | ICD-10-CM | POA: Diagnosis not present

## 2015-03-05 LAB — HEPATIC FUNCTION PANEL
ALBUMIN: 4.3 g/dL (ref 3.5–4.8)
ALK PHOS: 85 IU/L (ref 39–117)
ALT: 20 IU/L (ref 0–32)
AST: 22 IU/L (ref 0–40)
Bilirubin Total: 0.3 mg/dL (ref 0.0–1.2)
Bilirubin, Direct: 0.07 mg/dL (ref 0.00–0.40)
Total Protein: 6.7 g/dL (ref 6.0–8.5)

## 2015-03-05 LAB — LIPID PANEL
CHOL/HDL RATIO: 2.8 ratio (ref 0.0–4.4)
Cholesterol, Total: 193 mg/dL (ref 100–199)
HDL: 69 mg/dL (ref >39)
LDL Calculated: 96 mg/dL (ref 0–99)
Triglycerides: 141 mg/dL (ref 0–149)
VLDL Cholesterol Cal: 28 mg/dL (ref 5–40)

## 2015-03-05 LAB — GLUCOSE, RANDOM: GLUCOSE: 95 mg/dL (ref 65–99)

## 2015-03-06 ENCOUNTER — Ambulatory Visit (INDEPENDENT_AMBULATORY_CARE_PROVIDER_SITE_OTHER): Payer: Medicare Other | Admitting: Family Medicine

## 2015-03-06 ENCOUNTER — Encounter: Payer: Self-pay | Admitting: Family Medicine

## 2015-03-06 VITALS — BP 108/70 | Ht 62.0 in | Wt 113.2 lb

## 2015-03-06 DIAGNOSIS — E785 Hyperlipidemia, unspecified: Secondary | ICD-10-CM

## 2015-03-06 DIAGNOSIS — Z13820 Encounter for screening for osteoporosis: Secondary | ICD-10-CM | POA: Diagnosis not present

## 2015-03-06 DIAGNOSIS — J019 Acute sinusitis, unspecified: Secondary | ICD-10-CM | POA: Diagnosis not present

## 2015-03-06 DIAGNOSIS — M5431 Sciatica, right side: Secondary | ICD-10-CM | POA: Diagnosis not present

## 2015-03-06 DIAGNOSIS — Z23 Encounter for immunization: Secondary | ICD-10-CM

## 2015-03-06 MED ORDER — AMOXICILLIN 500 MG PO TABS: 500.0000 mg | ORAL_TABLET | Freq: Three times a day (TID) | ORAL | Status: DC

## 2015-03-06 NOTE — Progress Notes (Signed)
   Subjective:    Patient ID: [name], female    DOB: 07/14/1940, 74 y.o.   MRN: [id number]  Hyperlipidemia This is a chronic problem. The current episode started more than 1 year ago. There are no compliance problems.   Sinusitis This is a new problem. The current episode started in the past 7 days. Associated symptoms include congestion, sinus pressure and a sore throat. (Runny nose) Treatments tried: Salt water,  The treatment provided no relief.   Patient states overall she is doing well but she is having significant pain and discomfort in the right leg more than likely this is neuropathic pain from her back trouble from 2014. Asian very frustrated by it. She recently saw specialist who recommended trying gabapentin. She is, tried again, she will slowly titrate up. Patient recently had labs drawn on 03/04/15.  Review of Systems  HENT: Positive for congestion, sinus pressure and sore throat.    Denies chest pressure tightness pain shortness of breath    Objective:   Physical Exam  Constitutional: She appears well-developed.  HENT:  Head: Normocephalic.  Nose: Nose normal.  Mouth/Throat: Oropharynx is clear and moist. No oropharyngeal exudate.  Neck: Neck supple.  Cardiovascular: Normal rate and normal heart sounds.   No murmur heard. Pulmonary/Chest: Effort normal and breath sounds normal. She has no wheezes.  Lymphadenopathy:    She has no cervical adenopathy.  Skin: Skin is warm and dry.  Nursing note and vitals reviewed.         Assessment & Plan:  Hyperlipidemia lab work looks good tolerating medicine continue medication follow-up 6 months  Viral URI with secondary sinusitis antibiotics prescribed warning signs discussed  Asian due for bone density  Flu shot today

## 2015-03-11 NOTE — Telephone Encounter (Signed)
[facility] make [facility] dr branch's rx on 10/5 and pharm states they did and to let pharm know to send all future request to dr branch.

## 2015-03-11 NOTE — Telephone Encounter (Signed)
This was filled by dr branch on 02/13/15

## 2015-03-14 ENCOUNTER — Ambulatory Visit (HOSPITAL_COMMUNITY)
Admission: RE | Admit: 2015-03-14 | Discharge: 2015-03-14 | Disposition: A | Discharge status: Home / Self Care | Payer: Medicare Other | Source: Ambulatory Visit | Attending: Family Medicine | Admitting: Family Medicine

## 2015-03-14 ENCOUNTER — Encounter: Payer: Self-pay | Admitting: Family Medicine

## 2015-03-14 DIAGNOSIS — Z13820 Encounter for screening for osteoporosis: Secondary | ICD-10-CM | POA: Insufficient documentation

## 2015-03-14 DIAGNOSIS — Z780 Asymptomatic menopausal state: Secondary | ICD-10-CM | POA: Diagnosis not present

## 2015-03-14 DIAGNOSIS — M810 Age-related osteoporosis without current pathological fracture: Secondary | ICD-10-CM | POA: Diagnosis not present

## 2015-03-22 ENCOUNTER — Telehealth: Payer: Self-pay | Admitting: Family Medicine

## 2015-03-22 MED ORDER — ONDANSETRON 4 MG PO TBDP: 4.0000 mg | ORAL_TABLET | Freq: Four times a day (QID) | ORAL | Status: DC | PRN

## 2015-03-22 NOTE — Telephone Encounter (Signed)
Med sent to pharmacy. Hedy Camara was notified.

## 2015-03-22 NOTE — Telephone Encounter (Signed)
zofr 4 odt 20 one q6hrs prn

## 2015-03-22 NOTE — Telephone Encounter (Signed)
Pt is needing something called in for vomiting. Pt's husband states that she has been vomiting all night.   [facility]

## 2015-04-17 DIAGNOSIS — M47816 Spondylosis without myelopathy or radiculopathy, lumbar region: Secondary | ICD-10-CM | POA: Diagnosis not present

## 2015-04-17 DIAGNOSIS — M5126 Other intervertebral disc displacement, lumbar region: Secondary | ICD-10-CM | POA: Diagnosis not present

## 2015-05-10 ENCOUNTER — Telehealth: Payer: Self-pay | Admitting: Family Medicine

## 2015-05-10 MED ORDER — AMOXICILLIN 500 MG PO CAPS: 500.0000 mg | ORAL_CAPSULE | Freq: Three times a day (TID) | ORAL | Status: DC

## 2015-05-10 NOTE — Telephone Encounter (Signed)
Left message on voicemail notifying patient that medication was sent to pharmacy.  

## 2015-05-10 NOTE — Telephone Encounter (Signed)
Amoxicillin 500 mg 1 3 times a day 10 days follow-up if ongoing troubles

## 2015-05-10 NOTE — Telephone Encounter (Signed)
Pt is calling with a sore throat, congestion, drainage, slight pressure around eyes  Wants to know if you will send in some antibiotics like you issued her  The last time. Amoxicillin   [facility]

## 2015-05-27 ENCOUNTER — Other Ambulatory Visit: Payer: Self-pay | Admitting: Family Medicine

## 2015-05-27 DIAGNOSIS — Z1231 Encounter for screening mammogram for malignant neoplasm of breast: Secondary | ICD-10-CM

## 2015-05-28 DIAGNOSIS — M5020 Other cervical disc displacement, unspecified cervical region: Secondary | ICD-10-CM | POA: Diagnosis not present

## 2015-05-28 DIAGNOSIS — M47816 Spondylosis without myelopathy or radiculopathy, lumbar region: Secondary | ICD-10-CM | POA: Diagnosis not present

## 2015-05-28 DIAGNOSIS — M79672 Pain in left foot: Secondary | ICD-10-CM | POA: Diagnosis not present

## 2015-05-29 DIAGNOSIS — H524 Presbyopia: Secondary | ICD-10-CM | POA: Diagnosis not present

## 2015-05-29 DIAGNOSIS — H25813 Combined forms of age-related cataract, bilateral: Secondary | ICD-10-CM | POA: Diagnosis not present

## 2015-05-29 DIAGNOSIS — H5203 Hypermetropia, bilateral: Secondary | ICD-10-CM | POA: Diagnosis not present

## 2015-05-29 DIAGNOSIS — H52223 Regular astigmatism, bilateral: Secondary | ICD-10-CM | POA: Diagnosis not present

## 2015-05-30 ENCOUNTER — Ambulatory Visit (INDEPENDENT_AMBULATORY_CARE_PROVIDER_SITE_OTHER): Payer: Medicare Other | Admitting: Otolaryngology

## 2015-05-30 DIAGNOSIS — R490 Dysphonia: Secondary | ICD-10-CM

## 2015-05-30 DIAGNOSIS — K219 Gastro-esophageal reflux disease without esophagitis: Secondary | ICD-10-CM

## 2015-06-10 ENCOUNTER — Other Ambulatory Visit: Payer: Self-pay | Admitting: Family Medicine

## 2015-06-10 DIAGNOSIS — M47812 Spondylosis without myelopathy or radiculopathy, cervical region: Secondary | ICD-10-CM | POA: Diagnosis not present

## 2015-06-10 DIAGNOSIS — M5020 Other cervical disc displacement, unspecified cervical region: Secondary | ICD-10-CM | POA: Diagnosis not present

## 2015-06-18 ENCOUNTER — Other Ambulatory Visit: Payer: Self-pay | Admitting: Cardiology

## 2015-06-21 ENCOUNTER — Encounter: Payer: Self-pay | Admitting: Cardiology

## 2015-06-21 ENCOUNTER — Ambulatory Visit (INDEPENDENT_AMBULATORY_CARE_PROVIDER_SITE_OTHER): Payer: Medicare Other | Admitting: Cardiology

## 2015-06-21 VITALS — BP 106/62 | HR 69 | Ht 62.0 in | Wt 114.0 lb

## 2015-06-21 DIAGNOSIS — R0789 Other chest pain: Secondary | ICD-10-CM | POA: Diagnosis not present

## 2015-06-21 DIAGNOSIS — I480 Paroxysmal atrial fibrillation: Secondary | ICD-10-CM

## 2015-06-21 DIAGNOSIS — E785 Hyperlipidemia, unspecified: Secondary | ICD-10-CM | POA: Diagnosis not present

## 2015-06-21 NOTE — Progress Notes (Addendum)
Patient ID: MODESTY BELGRAVE, female   DOB: [birth year], 75 y.o.   MRN: [id number]     Clinical Summary Ms. Methvin is a 75 y.o.female seen today for follow up of the following medical problems.   1. Afib  - has done well on flecanide  -reports  rare palpitations, occurs 1-2 times a week, last just a few seconds.  - denies any bleeding troubles on eliquis.   2. Atypical chest pain - described at last visit with NP Purcell Nails - since last visit had MPI, low risk study with no ischemia  - denies any recent symptoms   3. Hyperlipidemia - 02/2015: TC 193 TG 141 HDL 69 LDL 96 - compliant with statin  Past Medical History  Diagnosis Date  . Paroxysmal atrial fibrillation (HCC)     Palpitations; maintained on flecainide; -normal coronary angiography and 1999; negative stress nuclear study in 11/2005  . Gastroesophageal reflux disease   . Hearing loss     mild  . Tobacco abuse, in remission     10-pack-years; quit in 2001  . Adenocarcinoma, colon ([place]) 03/2005    Stage I; resected in 03/2005  . Hyperlipidemia   . Ovarian cyst 2008  . PONV (postoperative nausea and vomiting)   . Arthritis   . Irregular heartbeat   . Gastroparesis      Allergies  Allergen Reactions  . Neurontin [Gabapentin]     Felt "loopy"     Current Outpatient Prescriptions  Medication Sig Dispense Refill  . amoxicillin (AMOXIL) 500 MG capsule Take 1 capsule (500 mg total) by mouth 3 (three) times daily. For 10 days 30 capsule 0  . Calcium Citrate (CITRACAL PO) Take 1 tablet by mouth every morning.     Marland Kitchen ELIQUIS 5 MG TABS tablet TAKE ONE TABLET TWICE DAILY 60 tablet 11  . flecainide (TAMBOCOR) 100 MG tablet Take 1 tablet (100 mg total) by mouth every 12 (twelve) hours. 180 tablet 2  . HYDROcodone-acetaminophen (NORCO) 10-325 MG per tablet Take 1 tablet by mouth 2 (two) times daily as needed. 60 tablet 0  . Lactulose SOLN Take 30 mLs by mouth at bedtime.     Earney Navy Bicarbonate (ZEGERID) 20-1100  MG CAPS capsule Take 1 capsule by mouth daily before breakfast.    . ondansetron (ZOFRAN ODT) 4 MG disintegrating tablet Take 1 tablet (4 mg total) by mouth every 6 (six) hours as needed for nausea or vomiting. 20 tablet 0  . rosuvastatin (CRESTOR) 40 MG tablet TAKE ONE (1) TABLET EACH DAY 30 tablet 2   No current facility-administered medications for this visit.     Past Surgical History  Procedure Laterality Date  . Partial colectomy  2006    adenocarcinoma  . Abdominal hysterectomy  1983    partial  . Cosmetic surgery  09/2007  . Colonoscopy  03/25/2012    [name], MD  . Tonsillectomy    . Cholecystectomy    . Colon surgery  2006    Colon cancer     Allergies  Allergen Reactions  . Neurontin [Gabapentin]     Felt "loopy"      Family History  Problem Relation Age of Onset  . Heart disease Mother   . Stroke Mother   . Heart attack Mother   . Cancer Father     lung  . Heart disease Father   . Hypertension Father   . Cancer Brother     rectal  . Peripheral vascular disease Brother   .  Healthy Son      Social History Ms. Cacchione reports that she quit smoking about 13 years ago. She started smoking about 52 years ago. She has never used smokeless tobacco. Ms. Surti reports that she does not drink alcohol.   Review of Systems CONSTITUTIONAL: No weight loss, fever, chills, weakness or fatigue.  HEENT: Eyes: No visual loss, blurred vision, double vision or yellow sclerae.No hearing loss, sneezing, congestion, runny nose or sore throat.  SKIN: No rash or itching.  CARDIOVASCULAR: per hpi RESPIRATORY: No shortness of breath, cough or sputum.  GASTROINTESTINAL: No anorexia, nausea, vomiting or diarrhea. No abdominal pain or blood.  GENITOURINARY: No burning on urination, no polyuria NEUROLOGICAL: No headache, dizziness, syncope, paralysis, ataxia, numbness or tingling in the extremities. No change in bowel or bladder control.  MUSCULOSKELETAL: No muscle, back  pain, joint pain or stiffness.  LYMPHATICS: No enlarged nodes. No history of splenectomy.  PSYCHIATRIC: No history of depression or anxiety.  ENDOCRINOLOGIC: No reports of sweating, cold or heat intolerance. No polyuria or polydipsia.  Marland Kitchen   Physical Examination Filed Vitals:   06/21/15 1312  BP: 106/62  Pulse: 69   Filed Vitals:   06/21/15 1312  Height: 5\' 2"  (1.575 m)  Weight: 114 lb (51.71 kg)    Gen: resting comfortably, no acute distress HEENT: no scleral icterus, pupils equal round and reactive, no palptable cervical adenopathy,  CV: RRR, no m/r/g, no jvd Resp: Clear to auscultation bilaterally GI: abdomen is soft, non-tender, non-distended, normal bowel sounds, no hepatosplenomegaly MSK: extremities are warm, no edema.  Skin: warm, no rash Neuro:  no focal deficits Psych: appropriate affect   Diagnostic Studies 03/2013 MPI  Overall low risk exercise Cardiolite. Equivocal ST segment abnormalities were noted, mild chest pain at peak exertion, otherwise no definite arrhythmias. Perfusion imaging is consistent with breast attenuation affecting the mid to apical anteroseptal wall, no definite ischemic defects. LVEF is 71% with normal volumes and no wall motion abnormalities.   06/21/15 clinic ekg (performed and reviewed in clinic): NSR Assessment and Plan  1. Afib  - no current symptoms, we will continue current meds including eliquis for stroke prevention   2. Atypical chest pain - low risk MPI with no evidence of ischemia - denies any recurrent symptoms - continue to monitor  3. Hyperlipidemia - at goal, continue statin      Follow up 1 year      Arnoldo Lenis, M.D.

## 2015-06-21 NOTE — Patient Instructions (Signed)
Your physician wants you to follow-up in: 1 year You will receive a reminder letter in the mail two months in advance. If you don't receive a letter, please call our office to schedule the follow-up appointment.   Your physician recommends that you continue on your current medications as directed. Please refer to the Current Medication list given to you today.    If you need a refill on your cardiac medications before your next appointment, please call your pharmacy.      Thank you for choosing [facility] !        

## 2015-07-01 ENCOUNTER — Ambulatory Visit (HOSPITAL_COMMUNITY): Payer: Medicare Other

## 2015-07-04 ENCOUNTER — Ambulatory Visit (HOSPITAL_COMMUNITY)
Admission: RE | Admit: 2015-07-04 | Discharge: 2015-07-04 | Disposition: A | Discharge status: Home / Self Care | Payer: Medicare Other | Source: Ambulatory Visit | Attending: Family Medicine | Admitting: Family Medicine

## 2015-07-04 DIAGNOSIS — Z1231 Encounter for screening mammogram for malignant neoplasm of breast: Secondary | ICD-10-CM | POA: Diagnosis not present

## 2015-07-10 DIAGNOSIS — M5020 Other cervical disc displacement, unspecified cervical region: Secondary | ICD-10-CM | POA: Diagnosis not present

## 2015-08-29 ENCOUNTER — Ambulatory Visit (INDEPENDENT_AMBULATORY_CARE_PROVIDER_SITE_OTHER): Payer: Medicare Other | Admitting: Otolaryngology

## 2015-08-29 DIAGNOSIS — K219 Gastro-esophageal reflux disease without esophagitis: Secondary | ICD-10-CM | POA: Diagnosis not present

## 2015-08-29 DIAGNOSIS — R490 Dysphonia: Secondary | ICD-10-CM

## 2015-09-06 ENCOUNTER — Other Ambulatory Visit: Payer: Self-pay | Admitting: Family Medicine

## 2015-09-14 ENCOUNTER — Other Ambulatory Visit: Payer: Self-pay | Admitting: Cardiology

## 2015-10-11 ENCOUNTER — Other Ambulatory Visit: Payer: Self-pay | Admitting: Family Medicine

## 2015-10-15 ENCOUNTER — Telehealth: Payer: Self-pay | Admitting: Family Medicine

## 2015-10-15 DIAGNOSIS — M47816 Spondylosis without myelopathy or radiculopathy, lumbar region: Secondary | ICD-10-CM | POA: Diagnosis not present

## 2015-10-15 DIAGNOSIS — M5020 Other cervical disc displacement, unspecified cervical region: Secondary | ICD-10-CM | POA: Diagnosis not present

## 2015-10-15 DIAGNOSIS — Z79899 Other long term (current) drug therapy: Secondary | ICD-10-CM

## 2015-10-15 DIAGNOSIS — E785 Hyperlipidemia, unspecified: Secondary | ICD-10-CM

## 2015-10-15 DIAGNOSIS — D649 Anemia, unspecified: Secondary | ICD-10-CM

## 2015-10-15 NOTE — Telephone Encounter (Signed)
CBC, lipid, liver, metabolic 7-hyperlipidemia, HTN, macrocytic anemia

## 2015-10-15 NOTE — Telephone Encounter (Signed)
Blood work ordered in EPIC. Patient notified. 

## 2015-10-15 NOTE — Telephone Encounter (Signed)
Pt is requesting lab orders to be sent over for upcoming appt. Last labs per epic were: lipid,hepatic and glucose on 03/04/15.

## 2015-10-29 DIAGNOSIS — D649 Anemia, unspecified: Secondary | ICD-10-CM | POA: Diagnosis not present

## 2015-10-29 DIAGNOSIS — E785 Hyperlipidemia, unspecified: Secondary | ICD-10-CM | POA: Diagnosis not present

## 2015-10-29 DIAGNOSIS — Z79899 Other long term (current) drug therapy: Secondary | ICD-10-CM | POA: Diagnosis not present

## 2015-10-30 LAB — BASIC METABOLIC PANEL
BUN / CREAT RATIO: 18 (ref 12–28)
BUN: 14 mg/dL (ref 8–27)
CHLORIDE: 104 mmol/L (ref 96–106)
CO2: 24 mmol/L (ref 18–29)
Calcium: 9.4 mg/dL (ref 8.7–10.3)
Creatinine, Ser: 0.77 mg/dL (ref 0.57–1.00)
GFR calc Af Amer: 89 mL/min/{1.73_m2} (ref >59)
GFR calc non Af Amer: 77 mL/min/{1.73_m2} (ref >59)
GLUCOSE: 89 mg/dL (ref 65–99)
POTASSIUM: 4.5 mmol/L (ref 3.5–5.2)
SODIUM: 142 mmol/L (ref 134–144)

## 2015-10-30 LAB — CBC WITH DIFFERENTIAL/PLATELET
BASOS: 0 %
Basophils Absolute: 0 10*3/uL (ref 0.0–0.2)
EOS (ABSOLUTE): 0.1 10*3/uL (ref 0.0–0.4)
EOS: 1 %
HEMATOCRIT: 41.6 % (ref 34.0–46.6)
Hemoglobin: 13.3 g/dL (ref 11.1–15.9)
IMMATURE GRANS (ABS): 0 10*3/uL (ref 0.0–0.1)
IMMATURE GRANULOCYTES: 0 %
LYMPHS: 39 %
Lymphocytes Absolute: 3 10*3/uL (ref 0.7–3.1)
MCH: 33 pg (ref 26.6–33.0)
MCHC: 32 g/dL (ref 31.5–35.7)
MCV: 103 fL — AB (ref 79–97)
MONOS ABS: 0.5 10*3/uL (ref 0.1–0.9)
Monocytes: 6 %
NEUTROS PCT: 54 %
Neutrophils Absolute: 4.1 10*3/uL (ref 1.4–7.0)
PLATELETS: 283 10*3/uL (ref 150–379)
RBC: 4.03 x10E6/uL (ref 3.77–5.28)
RDW: 13 % (ref 12.3–15.4)
WBC: 7.7 10*3/uL (ref 3.4–10.8)

## 2015-10-30 LAB — LIPID PANEL
Chol/HDL Ratio: 3 ratio units (ref 0.0–4.4)
Cholesterol, Total: 190 mg/dL (ref 100–199)
HDL: 64 mg/dL (ref >39)
LDL Calculated: 88 mg/dL (ref 0–99)
TRIGLYCERIDES: 190 mg/dL — AB (ref 0–149)
VLDL CHOLESTEROL CAL: 38 mg/dL (ref 5–40)

## 2015-10-30 LAB — HEPATIC FUNCTION PANEL
ALK PHOS: 76 IU/L (ref 39–117)
ALT: 15 IU/L (ref 0–32)
AST: 18 IU/L (ref 0–40)
Albumin: 4.5 g/dL (ref 3.5–4.8)
BILIRUBIN, DIRECT: 0.07 mg/dL (ref 0.00–0.40)
Bilirubin Total: 0.2 mg/dL (ref 0.0–1.2)
Total Protein: 6.5 g/dL (ref 6.0–8.5)

## 2015-11-05 ENCOUNTER — Ambulatory Visit (INDEPENDENT_AMBULATORY_CARE_PROVIDER_SITE_OTHER): Payer: Medicare Other | Admitting: Family Medicine

## 2015-11-05 ENCOUNTER — Encounter: Payer: Self-pay | Admitting: Family Medicine

## 2015-11-05 VITALS — BP 110/70 | Ht 62.0 in | Wt 114.5 lb

## 2015-11-05 DIAGNOSIS — M5431 Sciatica, right side: Secondary | ICD-10-CM | POA: Diagnosis not present

## 2015-11-05 DIAGNOSIS — E785 Hyperlipidemia, unspecified: Secondary | ICD-10-CM

## 2015-11-05 DIAGNOSIS — R7303 Prediabetes: Secondary | ICD-10-CM

## 2015-11-05 MED ORDER — TRIAMCINOLONE ACETONIDE 0.1 % EX CREA: 1.0000 "application " | TOPICAL_CREAM | Freq: Two times a day (BID) | CUTANEOUS | Status: DC

## 2015-11-05 NOTE — Progress Notes (Signed)
   Subjective:    Patient ID: [name], female    DOB: [birth year], 75 y.o.   MRN: [id number]  Hyperlipidemia This is a chronic problem. The current episode started more than 1 year ago. There are no known factors aggravating her hyperlipidemia. Pertinent negatives include no chest pain. Treatments tried: crestor. The current treatment provides moderate improvement of lipids. There are no compliance problems.  There are no known risk factors for coronary artery disease.   Patient states that she has skin irritation on her left lower leg. Itching noted. Onset 3-4 weeks ago. Treatments tried: anti itch cream with no relief.   Patient has intermittent sciatica down her leg she relates that gabapentin is greatly helping. She denies a causing any drowsiness  Review of Systems  Constitutional: Negative for activity change, appetite change and fatigue.  HENT: Negative for congestion.   Respiratory: Negative for cough.   Cardiovascular: Negative for chest pain.  Gastrointestinal: Negative for abdominal pain.  Endocrine: Negative for polydipsia and polyphagia.  Neurological: Negative for weakness.  Psychiatric/Behavioral: Negative for confusion.       Objective:   Physical Exam  Constitutional: She appears well-nourished. No distress.  Cardiovascular: Normal rate and normal heart sounds.   No murmur heard. Pulmonary/Chest: Effort normal and breath sounds normal. No respiratory distress.  Musculoskeletal: She exhibits no edema.  Lymphadenopathy:    She has no cervical adenopathy.  Neurological: She is alert. She exhibits normal muscle tone.  Psychiatric: Her behavior is normal.  Vitals reviewed.         Assessment & Plan:  Sugars overall look good. No particular worry there. Be careful about excessive starches  Elevated triglycerides best approach is low starch diet regular physical activity  Hyperlipidemia continue medication recheck lab work again in approximately 6  months  Rash on the right lower leg triamcinolone as this is very pure. This should help.  Patient is on long-term anticoagulant CBC looks good.

## 2015-11-07 ENCOUNTER — Other Ambulatory Visit: Payer: Self-pay | Admitting: Family Medicine

## 2016-01-08 DIAGNOSIS — R208 Other disturbances of skin sensation: Secondary | ICD-10-CM | POA: Diagnosis not present

## 2016-01-08 DIAGNOSIS — L820 Inflamed seborrheic keratosis: Secondary | ICD-10-CM | POA: Diagnosis not present

## 2016-02-27 ENCOUNTER — Ambulatory Visit (INDEPENDENT_AMBULATORY_CARE_PROVIDER_SITE_OTHER): Payer: Medicare Other | Admitting: Otolaryngology

## 2016-02-27 DIAGNOSIS — K219 Gastro-esophageal reflux disease without esophagitis: Secondary | ICD-10-CM

## 2016-02-27 DIAGNOSIS — R490 Dysphonia: Secondary | ICD-10-CM | POA: Diagnosis not present

## 2016-03-10 ENCOUNTER — Encounter: Payer: Self-pay | Admitting: Family Medicine

## 2016-03-10 ENCOUNTER — Ambulatory Visit (INDEPENDENT_AMBULATORY_CARE_PROVIDER_SITE_OTHER): Payer: Medicare Other | Admitting: Family Medicine

## 2016-03-10 VITALS — BP 110/72 | Ht 62.0 in | Wt 114.2 lb

## 2016-03-10 DIAGNOSIS — H6983 Other specified disorders of Eustachian tube, bilateral: Secondary | ICD-10-CM | POA: Diagnosis not present

## 2016-03-10 DIAGNOSIS — Z23 Encounter for immunization: Secondary | ICD-10-CM | POA: Diagnosis not present

## 2016-03-10 MED ORDER — FLUTICASONE PROPIONATE 50 MCG/ACT NA SUSP: 2.0000 | Freq: Every day | NASAL | 5 refills | Status: DC

## 2016-03-10 NOTE — Progress Notes (Signed)
   Subjective:    Patient ID: [name], female    DOB: June 29, 1940, 75 y.o.   MRN: [id number]  HPI  Patient arrives with c/o ears stopped up since Thursday She relates bilateral ear pressure and discomfort denies high fever chills denies sweats nausea vomiting diarrhea congestion cough wheezing Review of Systems    she relates moderate ear pressure. Denies any other trouble see above Objective:   Physical Exam Eardrums appear to be retracted but not read lungs clear heart regular       Assessment & Plan:  Bilateral eustachian tube dysfunction try Flonase on a regular basis if not seen significant success over the next 10-14 days notify us we will help set the patient up with Dr.Teoh who she has seen before

## 2016-03-30 ENCOUNTER — Ambulatory Visit (INDEPENDENT_AMBULATORY_CARE_PROVIDER_SITE_OTHER): Payer: Medicare Other | Admitting: Otolaryngology

## 2016-03-30 DIAGNOSIS — H906 Mixed conductive and sensorineural hearing loss, bilateral: Secondary | ICD-10-CM

## 2016-03-30 DIAGNOSIS — H6981 Other specified disorders of Eustachian tube, right ear: Secondary | ICD-10-CM | POA: Diagnosis not present

## 2016-04-27 ENCOUNTER — Ambulatory Visit (INDEPENDENT_AMBULATORY_CARE_PROVIDER_SITE_OTHER): Payer: Medicare Other | Admitting: Otolaryngology

## 2016-04-27 DIAGNOSIS — H900 Conductive hearing loss, bilateral: Secondary | ICD-10-CM

## 2016-04-27 DIAGNOSIS — H6983 Other specified disorders of Eustachian tube, bilateral: Secondary | ICD-10-CM

## 2016-05-07 ENCOUNTER — Other Ambulatory Visit: Payer: Self-pay | Admitting: Family Medicine

## 2016-05-12 ENCOUNTER — Telehealth: Payer: Self-pay | Admitting: Family Medicine

## 2016-05-12 DIAGNOSIS — M542 Cervicalgia: Secondary | ICD-10-CM | POA: Diagnosis not present

## 2016-05-12 DIAGNOSIS — E782 Mixed hyperlipidemia: Secondary | ICD-10-CM

## 2016-05-12 DIAGNOSIS — G8929 Other chronic pain: Secondary | ICD-10-CM | POA: Diagnosis not present

## 2016-05-12 DIAGNOSIS — M25511 Pain in right shoulder: Secondary | ICD-10-CM | POA: Diagnosis not present

## 2016-05-12 DIAGNOSIS — R7303 Prediabetes: Secondary | ICD-10-CM

## 2016-05-12 NOTE — Telephone Encounter (Signed)
Pt is requesting lab orders to be sent over for an upcoming appt next Fri. Last labs per epic were: bmp,hepatic,lipid,and cbc on 10/29/15.

## 2016-05-13 NOTE — Telephone Encounter (Signed)
Lipid, liver, metabolic 7, CBC 

## 2016-05-13 NOTE — Telephone Encounter (Signed)
Patient aware that orders entered and that labs are fasting

## 2016-05-18 DIAGNOSIS — E782 Mixed hyperlipidemia: Secondary | ICD-10-CM | POA: Diagnosis not present

## 2016-05-18 DIAGNOSIS — R7303 Prediabetes: Secondary | ICD-10-CM | POA: Diagnosis not present

## 2016-05-19 ENCOUNTER — Ambulatory Visit (HOSPITAL_COMMUNITY): Payer: Medicare Other | Attending: Orthopedic Surgery

## 2016-05-19 ENCOUNTER — Encounter (HOSPITAL_COMMUNITY): Payer: Self-pay

## 2016-05-19 DIAGNOSIS — R29898 Other symptoms and signs involving the musculoskeletal system: Secondary | ICD-10-CM | POA: Insufficient documentation

## 2016-05-19 DIAGNOSIS — M25611 Stiffness of right shoulder, not elsewhere classified: Secondary | ICD-10-CM | POA: Insufficient documentation

## 2016-05-19 DIAGNOSIS — M542 Cervicalgia: Secondary | ICD-10-CM

## 2016-05-19 DIAGNOSIS — M25511 Pain in right shoulder: Secondary | ICD-10-CM | POA: Diagnosis not present

## 2016-05-19 DIAGNOSIS — G8929 Other chronic pain: Secondary | ICD-10-CM | POA: Diagnosis not present

## 2016-05-19 LAB — BASIC METABOLIC PANEL
BUN/Creatinine Ratio: 23 (ref 12–28)
BUN: 19 mg/dL (ref 8–27)
CO2: 24 mmol/L (ref 18–29)
Calcium: 9.5 mg/dL (ref 8.7–10.3)
Chloride: 103 mmol/L (ref 96–106)
Creatinine, Ser: 0.82 mg/dL (ref 0.57–1.00)
GFR calc Af Amer: 82 mL/min/{1.73_m2} (ref >59)
GFR, EST NON AFRICAN AMERICAN: 71 mL/min/{1.73_m2} (ref >59)
GLUCOSE: 99 mg/dL (ref 65–99)
Potassium: 4.5 mmol/L (ref 3.5–5.2)
Sodium: 143 mmol/L (ref 134–144)

## 2016-05-19 LAB — LIPID PANEL
CHOL/HDL RATIO: 2.9 ratio (ref 0.0–4.4)
CHOLESTEROL TOTAL: 176 mg/dL (ref 100–199)
HDL: 61 mg/dL (ref >39)
LDL CALC: 77 mg/dL (ref 0–99)
Triglycerides: 190 mg/dL — ABNORMAL HIGH (ref 0–149)
VLDL CHOLESTEROL CAL: 38 mg/dL (ref 5–40)

## 2016-05-19 LAB — CBC
HEMATOCRIT: 40.5 % (ref 34.0–46.6)
HEMOGLOBIN: 12.6 g/dL (ref 11.1–15.9)
MCH: 32.2 pg (ref 26.6–33.0)
MCHC: 31.1 g/dL — ABNORMAL LOW (ref 31.5–35.7)
MCV: 104 fL — ABNORMAL HIGH (ref 79–97)
Platelets: 320 10*3/uL (ref 150–379)
RBC: 3.91 x10E6/uL (ref 3.77–5.28)
RDW: 12.8 % (ref 12.3–15.4)
WBC: 9.6 10*3/uL (ref 3.4–10.8)

## 2016-05-19 LAB — HEPATIC FUNCTION PANEL
ALBUMIN: 4.3 g/dL (ref 3.5–4.8)
ALT: 16 IU/L (ref 0–32)
AST: 15 IU/L (ref 0–40)
Alkaline Phosphatase: 89 IU/L (ref 39–117)
BILIRUBIN TOTAL: 0.3 mg/dL (ref 0.0–1.2)
Bilirubin, Direct: 0.05 mg/dL (ref 0.00–0.40)
Total Protein: 6.6 g/dL (ref 6.0–8.5)

## 2016-05-19 NOTE — Patient Instructions (Signed)
  Flexibility: Upper Trapezius Stretch   Gently grasp right side of head while reaching behind back with other hand. Tilt head away until a gentle stretch is felt. Hold _10___ seconds. Repeat _2___ times per set. Do ___1_ sets per session. Do _2-3___ sessions per day.  http://[email or web address].us/340   Levator Stretch   Grasp seat or sit on hand on side to be stretched. Turn head toward other side and look down. Use hand on head to gently stretch neck in that position. Hold _10___ seconds. Repeat on other side. Repeat __2__ times. Do __2-3__ sessions per day.  http://[email or web address].us/30   Scapular Retraction (Standing)   With arms at sides, pinch shoulder blades together. Repeat _10___ times per set. Do _1___ sets per session. Do _2-3___ sessions per day.  http://orth.exer.us/944   Flexibility: Neck Retraction   Pull head straight back, keeping eyes and jaw level. Repeat __10__ times per set. Do __1__ sets per session. Do __2-3__ sessions per day.  http://orth.[email or web address].us/344   Posture - Sitting   Sit upright, head facing forward. Try using a roll to support lower back. Keep shoulders relaxed, and avoid rounded back. Keep hips level with knees. Avoid crossing legs for long periods.   Flexibility: Corner Stretch   Standing in corner with hands just above shoulder level and feet ____ inches from corner, lean forward until a comfortable stretch is felt across chest. Hold __10__ seconds. Repeat __2__ times per set. Do __1__ sets per session. Do _2-3___ sessions per day.  http://orth.exer.us/342   Copyright  VHI. All rights reserved.

## 2016-05-19 NOTE — Therapy (Signed)
[place], [place], [address] Phone: [phone]   Fax:  [phone]  Occupational Therapy Evaluation  Patient Details  Name: [name] MRN: [id number] Date of Birth: [birth year] Referring Provider: Netta Cedars, MD  Encounter Date: 05/19/2016      OT End of Session - 05/19/16 1536    Visit Number 1   Number of Visits 8   Date for OT Re-Evaluation 06/18/16   Authorization Type 1) Medicare 2) generic cigna   Authorization Time Period before 10th visit   Authorization - Visit Number 1   Authorization - Number of Visits 10   OT Start Time [id number]   OT Stop Time 1430   OT Time Calculation (min) 45 min   Activity Tolerance Patient tolerated treatment well   Behavior During Therapy [facility] for tasks assessed/performed      Past Medical History:  Diagnosis Date  . Adenocarcinoma, colon ([place]) 03/2005   Stage I; resected in 03/2005  . Arthritis   . Gastroesophageal reflux disease   . Gastroparesis   . Hearing loss    mild  . Hyperlipidemia   . Irregular heartbeat   . Ovarian cyst 2008  . Paroxysmal atrial fibrillation (HCC)    Palpitations; maintained on flecainide; -normal coronary angiography and 1999; negative stress nuclear study in 11/2005  . PONV (postoperative nausea and vomiting)   . Tobacco abuse, in remission    10-pack-years; quit in 2001    Past Surgical History:  Procedure Laterality Date  . ABDOMINAL HYSTERECTOMY  1983   partial  . CHOLECYSTECTOMY    . COLON SURGERY  2006   Colon cancer  . COLONOSCOPY  03/25/2012   [name], MD  . COSMETIC SURGERY  09/2007  . PARTIAL COLECTOMY  2006   adenocarcinoma  . TONSILLECTOMY      There were no vitals filed for this visit.      Subjective Assessment - 05/19/16 1349    Subjective  S: My neck doesn't hurt as bad as my shoulder.    Pertinent History Patient is a 76 y/o female S/P right cervical pain which has moved to her right shoulder  and now is causing decreased strength in her right hand. patient reports that her pain started approximately 2 years ago. patient completed a X-Ray last week and it was shown that she had a pinched nerve in her neck. Dr. Veverly Fells has referred patient to occupational therapy for evaluation and treatment.    Special Tests FOTO score: 55/100   Patient Stated Goals To decrease pain in neck and shoulder   Currently in Pain? Yes   Pain Score 5    Pain Location Shoulder   Pain Orientation Right   Pain Descriptors / Indicators Throbbing   Pain Type Chronic pain   Pain Radiating Towards N/A   Pain Onset More than a month ago   Pain Frequency Constant   Aggravating Factors  Nothing   Pain Relieving Factors raising arm up, pain meds   Effect of Pain on Daily Activities Push through the pain   Multiple Pain Sites Yes   Pain Score 2   Pain Location Neck   Pain Orientation Left   Pain Descriptors / Indicators Throbbing   Pain Type Chronic pain   Pain Radiating Towards radiates down to shoulder   Pain Onset More than a month ago   Pain Frequency Intermittent   Aggravating Factors  nothing   Pain Relieving Factors pain  meds   Effect of Pain on Daily Activities Pushes through the pain           [facility] OT Assessment - 05/19/16 1346      Assessment   Diagnosis Cervical pain   Referring Provider Netta Cedars, MD   Onset Date --  2 years ago   Assessment Pt is to return to the MD when finished with therapy.   Prior Therapy None     Precautions   Precautions None     Restrictions   Weight Bearing Restrictions No     Balance Screen   Has the patient fallen in the past 6 months No     Home  Environment   Family/patient expects to be discharged to: Private residence     Prior Function   Level of [place] Ret[employer]     ADL   ADL comments Pt reports that right hand feels weak and has difficulty with holding items and gripping. Difficulty with household chores  (mopping dusting sweeping)     Mobility   Mobility Status Independent     Written Expression   Dominant Hand Right     Vision - History   Baseline Vision No visual deficits     Cognition   Overall Cognitive Status Within Functional Limits for tasks assessed     ROM / Strength   AROM / PROM / Strength AROM;PROM;Strength     Palpation   Palpation comment Max fascial restrictions in right upper arm, trapezius, scapularis and right lateral cervical region.     AROM   Overall AROM Comments Assessed seated. IR/er adducted   AROM Assessment Site Shoulder;Cervical   Right/Left Shoulder Right   Right Shoulder Flexion 160 Degrees   Right Shoulder ABduction 160 Degrees   Right Shoulder Internal Rotation 90 Degrees   Right Shoulder External Rotation 75 Degrees   Cervical Flexion 80  WNl   Cervical Extension 60  WNL   Cervical - Right Side Bend 35  WNL   Cervical - Left Side Bend 35  WNL   Cervical - Right Rotation 70   Cervical - Left Rotation 90  WNL     PROM   Overall PROM  Within functional limits for tasks performed   PROM Assessment Site Shoulder     Strength   Overall Strength Comments Assessed seated. IR/er adducted   Strength Assessment Site Shoulder;Hand   Right/Left Shoulder Right   Right Shoulder Flexion 4+/5   Right Shoulder ABduction 4+/5   Right Shoulder Internal Rotation 5/5   Right Shoulder External Rotation 5/5   Right/Left hand Right;Left   Right Hand Grip (lbs) 30   Right Hand Lateral Pinch 10 lbs   Right Hand 3 Point Pinch 4 lbs   Left Hand Grip (lbs) 40   Left Hand Lateral Pinch 11 lbs   Left Hand 3 Point Pinch 10 lbs                         OT Education - 05/19/16 1507    Education provided Yes   Education Details Discussed anatomy of neck and shoulder, diagnosis of pinched nerve and plan of care. Pt was given HEP including cervical and shoulder stretches.   Person(s) Educated Patient   Methods  Explanation;Demonstration;Verbal cues;Handout   Comprehension Returned demonstration;Verbalized understanding          OT Short Term Goals - 05/19/16 1543      OT SHORT  TERM GOAL #1   Title Patient will be educated and independent with HEP to increase functional mobility and comfort during RUE use.    Time 4   Period Weeks   Status New     OT SHORT TERM GOAL #2   Title Patient will report a decrease in pain level in right shoulder and cervical region when completing daily tasks.    Time 4   Period Weeks   Status New     OT SHORT TERM GOAL #3   Title Patient will increase RUE strength to 5/5 to be able to complete daily household chores with more comfort and return to mopping the floor.   Time 4   Period Weeks   Status New     OT SHORT TERM GOAL #4   Title Patient will decrease fascial restrictions to min amount to increase functional mobility in right shoulder and cervical region.   Time 4   Period Days   Status New     OT SHORT TERM GOAL #5   Title Patient will return to highest level of independence with all daily task when using RUE.   Time 4   Period Weeks   Status New     Additional Short Term Goals   Additional Short Term Goals Yes     OT SHORT TERM GOAL #6   Title Patient will increase right grip strength by 10# and 3 point pinch by 5# to increase ability to hold onto items without dropping them.   Time 4   Period Weeks   Status New                  Plan - 05/19/16 1540    Clinical Impression Statement A: Pt is a 76 y/o female S/P Cervical pain, right shoulder pain with hand weakness caused by a pinched nerve in the cervical region resulting in fascial restrictions, ROM limitations, and decreased ability to complete daily tasks.    Rehab Potential Excellent   Clinical Impairments Affecting Rehab Potential patient has a history of back surgery in 2015   OT Frequency 2x / week   OT Duration 4 weeks   OT Treatment/Interventions Self-care/ADL  training;Therapeutic exercise;Patient/family education;Manual Therapy;Ultrasound;Therapeutic activities;DME and/or AE instruction;Cryotherapy;Electrical Stimulation;Moist Heat;Passive range of motion   Plan P: Patient will benefit from skilled OT services to increase functional performance during daily tasks. Treatment Plan: Myofascial release to cervical region and right shoulder. passive stretching, Cervical and shoulder stretching, manual cervical traction, general strengthening and ROM of shoulder and neck.    OT Home Exercise Plan 1/9: cervical and shoulder stretches   Consulted and Agree with Plan of Care Patient      Patient will benefit from skilled therapeutic intervention in order to improve the following deficits and impairments:  Decreased strength, Decreased range of motion, Pain, Increased fascial restricitons  Visit Diagnosis: Cervicalgia - Plan: OT PLAN OF CARE CERT/RE-CERT  Chronic right shoulder pain - Plan: OT PLAN OF CARE CERT/RE-CERT  Stiffness of right shoulder, not elsewhere classified - Plan: OT PLAN OF CARE CERT/RE-CERT  Other symptoms and signs involving the musculoskeletal system - Plan: OT PLAN OF CARE CERT/RE-CERT      G-Codes - [id number]    Functional Assessment Tool Used FOTO score: 55/100 (45% impaired)   Functional Limitation Carrying, moving and handling objects   Carrying, Moving and Handling Objects Current Status [id number]) At least 40 percent but less than 60 percent impaired, limited or restricted   Carrying, Moving  and Handling Objects Goal Status [phone]) At least 20 percent but less than 40 percent impaired, limited or restricted      Problem List Patient Active Problem List   Diagnosis Date Noted  . Sciatica of right side 03/06/2015  . Knee pain, left 03/13/2014  . Pain in joint, ankle and foot 03/13/2014  . Muscle weakness (generalized) 01/10/2014  . Difficulty in walking(719.7) 01/10/2014  . Lumbago 01/10/2014  . Sciatica of left side  04/26/2013  . Chest pain at rest 03/21/2013  . Prediabetes 12/30/2012  . Paroxysmal atrial fibrillation (HCC)   . ADENOCARCINOMA, COLON 06/24/2009  . GASTROESOPHAGEAL REFLUX DISEASE 06/24/2009  . Hyperlipidemia 06/12/2009     Ailene Ravel, OTR/L,CBIS  [phone]  05/19/2016, 3:51 PM  [place] [address] [place], [place], [address] Phone: [phone]   Fax:  [phone]  Name: [name] MRN: [id number] Date of Birth: April 28, 1941

## 2016-05-21 ENCOUNTER — Ambulatory Visit (HOSPITAL_COMMUNITY): Payer: Medicare Other

## 2016-05-21 ENCOUNTER — Encounter (HOSPITAL_COMMUNITY): Payer: Self-pay

## 2016-05-21 DIAGNOSIS — G8929 Other chronic pain: Secondary | ICD-10-CM | POA: Diagnosis not present

## 2016-05-21 DIAGNOSIS — R29898 Other symptoms and signs involving the musculoskeletal system: Secondary | ICD-10-CM

## 2016-05-21 DIAGNOSIS — M25511 Pain in right shoulder: Secondary | ICD-10-CM

## 2016-05-21 DIAGNOSIS — M542 Cervicalgia: Secondary | ICD-10-CM | POA: Diagnosis not present

## 2016-05-21 DIAGNOSIS — M25611 Stiffness of right shoulder, not elsewhere classified: Secondary | ICD-10-CM

## 2016-05-21 NOTE — Therapy (Signed)
[place], [place], [address] Phone: [phone]   Fax:  [phone]  Occupational Therapy Treatment  Patient Details  Name: [name] MRN: [id number] Date of Birth: 08/24/1940 Referring Provider: Netta Cedars, MD  Encounter Date: 05/21/2016      OT End of Session - 05/21/16 1618    Visit Number 2   Number of Visits 8   Date for OT Re-Evaluation 06/18/16   Authorization Type 1) Medicare 2) generic cigna   Authorization Time Period before 10th visit   Authorization - Visit Number 2   Authorization - Number of Visits 10   OT Start Time [id number]   OT Stop Time 1355   OT Time Calculation (min) 50 min   Activity Tolerance Patient tolerated treatment well   Behavior During Therapy [facility] for tasks assessed/performed      Past Medical History:  Diagnosis Date  . Adenocarcinoma, colon ([place]) 03/2005   Stage I; resected in 03/2005  . Arthritis   . Gastroesophageal reflux disease   . Gastroparesis   . Hearing loss    mild  . Hyperlipidemia   . Irregular heartbeat   . Ovarian cyst 2008  . Paroxysmal atrial fibrillation (HCC)    Palpitations; maintained on flecainide; -normal coronary angiography and 1999; negative stress nuclear study in 11/2005  . PONV (postoperative nausea and vomiting)   . Tobacco abuse, in remission    10-pack-years; quit in 2001    Past Surgical History:  Procedure Laterality Date  . ABDOMINAL HYSTERECTOMY  1983   partial  . CHOLECYSTECTOMY    . COLON SURGERY  2006   Colon cancer  . COLONOSCOPY  03/25/2012   [name], MD  . COSMETIC SURGERY  09/2007  . PARTIAL COLECTOMY  2006   adenocarcinoma  . TONSILLECTOMY      There were no vitals filed for this visit.      Subjective Assessment - 05/21/16 1428    Subjective  S: My shoulder is still worse than my neck and now my thumb has started to hurt worse than my whole hand.    Currently in Pain? Yes   Pain Score 4    Pain  Location Shoulder   Pain Orientation Right   Pain Descriptors / Indicators Throbbing   Pain Type Chronic pain   Pain Radiating Towards N/A   Pain Onset More than a month ago   Pain Frequency Constant   Aggravating Factors  Nothing   Pain Relieving Factors Raising arm up, pain meds   Effect of Pain on Daily Activities push through the pain   Multiple Pain Sites Yes   Pain Score 3   Pain Location Neck   Pain Orientation Left   Pain Descriptors / Indicators Throbbing   Pain Type Chronic pain   Pain Radiating Towards radiates down to shoulder   Pain Onset More than a month ago   Pain Frequency Intermittent   Aggravating Factors  nothing    Pain Relieving Factors pain meds   Effect of Pain on Daily Activities pushes through the pain   Pain Score 8   Pain Location Finger (Comment which one)  thumb   Pain Orientation Right   Pain Descriptors / Indicators Throbbing   Pain Type Acute pain   Pain Radiating Towards N/A   Pain Onset Yesterday   Pain Frequency Constant   Aggravating Factors  Unsure. Did try to open a tight jar the other day.  Pain Relieving Factors Nothing.   Effect of Pain on Daily Activities Severe            [facility] OT Assessment - 05/21/16 1423      Assessment   Diagnosis Cervical pain     Precautions   Precautions None                  OT Treatments/Exercises (OP) - 05/21/16 1423      Exercises   Exercises Shoulder;Hand;Cognitive     Modalities   Modalities Moist Heat     Moist Heat Therapy   Number Minutes Moist Heat 10 Minutes   Moist Heat Location Shoulder;Cervical     Manual Therapy   Manual Therapy Myofascial release;Manual Traction   Manual therapy comments Manual therapy completed prior to exercises.   Myofascial Release Myofascial release completed to right thumb, right upper arm, trapezius, scapularis, and cervical region to decrease fascial restrictions and increase joint mobility in a pain free zone.    Manual Traction  Gentle manual traction completed to cervical region.                   OT Short Term Goals - 05/21/16 1423      OT SHORT TERM GOAL #1   Title Patient will be educated and independent with HEP to increase functional mobility and comfort during RUE use.    Time 4   Period Weeks   Status On-going     OT SHORT TERM GOAL #2   Title Patient will report a decrease in pain level in right shoulder and cervical region when completing daily tasks.    Time 4   Period Weeks   Status On-going     OT SHORT TERM GOAL #3   Title Patient will increase RUE strength to 5/5 to be able to complete daily household chores with more comfort and return to mopping the floor.   Time 4   Period Weeks   Status On-going     OT SHORT TERM GOAL #4   Title Patient will decrease fascial restrictions to min amount to increase functional mobility in right shoulder and cervical region.   Time 4   Period Days   Status On-going     OT SHORT TERM GOAL #5   Title Patient will return to highest level of independence with all daily task when using RUE.   Time 4   Period Weeks   Status On-going     OT SHORT TERM GOAL #6   Title Patient will increase right grip strength by 10# and 3 point pinch by 5# to increase ability to hold onto items without dropping them.   Time 4   Period Weeks   Status On-going                  Plan - 05/21/16 1618    Clinical Impression Statement A: Session focused on pain management in entire RUE and cervical region. Patient with increased tightness, trigger points, and fascial restrictions in right upper trapezius, scapularis, and cervical region. Patient did have a good response to manual technique and heat was applied to neck and shoulder at end of session.    Plan P: continue manual techniques on right UE and cervical region. Add cervical and shoulder A/ROM exercises.       Patient will benefit from skilled therapeutic intervention in order to improve the  following deficits and impairments:  Decreased strength, Decreased range of motion, Pain, Increased fascial  restricitons  Visit Diagnosis: Cervicalgia  Chronic right shoulder pain  Stiffness of right shoulder, not elsewhere classified  Other symptoms and signs involving the musculoskeletal system    Problem List Patient Active Problem List   Diagnosis Date Noted  . Sciatica of right side 03/06/2015  . Knee pain, left 03/13/2014  . Pain in joint, ankle and foot 03/13/2014  . Muscle weakness (generalized) 01/10/2014  . Difficulty in walking(719.7) 01/10/2014  . Lumbago 01/10/2014  . Sciatica of left side 04/26/2013  . Chest pain at rest 03/21/2013  . Prediabetes 12/30/2012  . Paroxysmal atrial fibrillation (HCC)   . ADENOCARCINOMA, COLON 06/24/2009  . GASTROESOPHAGEAL REFLUX DISEASE 06/24/2009  . Hyperlipidemia 06/12/2009   Ailene Ravel, OTR/L,CBIS  [phone]  05/21/2016, 4:22 PM  [facility] [address] [place], [place], [address] Phone: [phone]   Fax:  [phone]  Name: [name] MRN: [id number] Date of Birth: [birth year]

## 2016-05-22 ENCOUNTER — Ambulatory Visit (INDEPENDENT_AMBULATORY_CARE_PROVIDER_SITE_OTHER): Payer: Medicare Other | Admitting: Family Medicine

## 2016-05-22 ENCOUNTER — Encounter: Payer: Self-pay | Admitting: Family Medicine

## 2016-05-22 VITALS — BP 102/64 | Ht 62.0 in | Wt 115.4 lb

## 2016-05-22 DIAGNOSIS — I480 Paroxysmal atrial fibrillation: Secondary | ICD-10-CM

## 2016-05-22 DIAGNOSIS — M25541 Pain in joints of right hand: Secondary | ICD-10-CM | POA: Diagnosis not present

## 2016-05-22 DIAGNOSIS — E782 Mixed hyperlipidemia: Secondary | ICD-10-CM

## 2016-05-22 DIAGNOSIS — M25542 Pain in joints of left hand: Secondary | ICD-10-CM

## 2016-05-22 DIAGNOSIS — M792 Neuralgia and neuritis, unspecified: Secondary | ICD-10-CM

## 2016-05-22 DIAGNOSIS — G609 Hereditary and idiopathic neuropathy, unspecified: Secondary | ICD-10-CM

## 2016-05-22 NOTE — Progress Notes (Signed)
   Subjective:    Patient ID: [name], female    DOB: May 29, 1940, 75 y.o.   MRN: [id number]  Hyperlipidemia  This is a chronic problem. The current episode started more than 1 year ago. There are no known factors aggravating her hyperlipidemia. Treatments tried: Crestor. The current treatment provides moderate improvement of lipids. There are no compliance problems.  There are no known risk factors for coronary artery disease.   Patient states that she has a lot of concerns and she will talk to just the doctor about them.  Her first concern is arthralgias in the hand she states this been going on for the past several months.  Patient also relates that she thinks her knee pain and hand pain are related to Crestor she states her orthopedist told her she does not have arthritis in her knees so therefore she thinks it must be the medicine we had a long discussion about Review of Systems She denies chest tightness pressure pain shortness breath nausea vomiting diarrhea swelling in the legs    Objective:   Physical Exam There is no crepitus in her knees Her lungs are clear does no crackle Heart is regular She does have some evidence of arthralgias/arthritis in her hands no redness just more of bone changes consistent with probable osteoarthritis Extremities no edema  25 minutes was spent with the patient. Greater than half the time was spent in discussion and answering questions and counseling regarding the issues that the patient came in for today.      Assessment & Plan:  Atrial fibrillation stable on Eliquis no bleeding issues associated with this Hyperlipidemia takes her Crestor previous lab work reviewed with the patient in detail  This patient also relates that she has a lot of knee pain and hand pain that she thinks is related to the Crestor long discussion held with the patient regarding how to figure this out. I believe it would be reasonable for her to hold off on her statins  over the next 2-3 weeks and if her symptoms dramatically get better it's the medication if it is not doing better it's not the medicine she is in ingredients with trying this she will let us know how she is doing-if it does seem to be the medicine we can try different statin class of fact then the patient will just need to be on a different approach   Arthralgias in the hands lab work ordered I doubt it's rheumatoid arthritis but she has 2 family members with a history rheumatoid arthritis May need referral to rheumatology if ongoing troubles Patient with occasional numbness into the hand and occasionally into the foot we will check B12 level patient with 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 Patient is a continue her medication

## 2016-05-23 LAB — VITAMIN B12: VITAMIN B 12: 469 pg/mL (ref 232–1245)

## 2016-05-23 LAB — C-REACTIVE PROTEIN: CRP: 1.1 mg/L (ref 0.0–4.9)

## 2016-05-23 LAB — SEDIMENTATION RATE: Sed Rate: 9 mm/hr (ref 0–40)

## 2016-05-23 LAB — CK: Total CK: 46 U/L (ref 24–173)

## 2016-05-23 LAB — RHEUMATOID FACTOR: RHEUMATOID FACTOR: 10.9 [IU]/mL (ref 0.0–13.9)

## 2016-05-23 MED ORDER — ROSUVASTATIN CALCIUM 40 MG PO TABS: ORAL_TABLET | ORAL | 5 refills | Status: DC

## 2016-05-25 DIAGNOSIS — M5020 Other cervical disc displacement, unspecified cervical region: Secondary | ICD-10-CM | POA: Diagnosis not present

## 2016-05-25 DIAGNOSIS — M47816 Spondylosis without myelopathy or radiculopathy, lumbar region: Secondary | ICD-10-CM | POA: Diagnosis not present

## 2016-05-26 ENCOUNTER — Encounter (HOSPITAL_COMMUNITY): Payer: Self-pay

## 2016-05-26 ENCOUNTER — Ambulatory Visit (HOSPITAL_COMMUNITY): Payer: Medicare Other

## 2016-05-26 DIAGNOSIS — M25511 Pain in right shoulder: Secondary | ICD-10-CM

## 2016-05-26 DIAGNOSIS — R29898 Other symptoms and signs involving the musculoskeletal system: Secondary | ICD-10-CM | POA: Diagnosis not present

## 2016-05-26 DIAGNOSIS — M25611 Stiffness of right shoulder, not elsewhere classified: Secondary | ICD-10-CM

## 2016-05-26 DIAGNOSIS — G8929 Other chronic pain: Secondary | ICD-10-CM | POA: Diagnosis not present

## 2016-05-26 DIAGNOSIS — M542 Cervicalgia: Secondary | ICD-10-CM

## 2016-05-26 NOTE — Therapy (Signed)
[place], [place], [address] Phone: [phone]   Fax:  [phone]  Occupational Therapy Treatment  Patient Details  Name: [name] MRN: [id number] Date of Birth: [birth year] Referring Provider: Netta Cedars, MD  Encounter Date: 05/26/2016      OT End of Session - 05/26/16 1413    Visit Number 3   Number of Visits 8   Date for OT Re-Evaluation 06/18/16   Authorization Type 1) Medicare 2) generic cigna   Authorization Time Period before 10th visit   Authorization - Visit Number 3   Authorization - Number of Visits 10   OT Start Time [id number]   OT Stop Time 1400   OT Time Calculation (min) 55 min   Activity Tolerance Patient tolerated treatment well   Behavior During Therapy [facility] for tasks assessed/performed      Past Medical History:  Diagnosis Date  . Adenocarcinoma, colon ([place]) 03/2005   Stage I; resected in 03/2005  . Arthritis   . Gastroesophageal reflux disease   . Gastroparesis   . Hearing loss    mild  . Hyperlipidemia   . Irregular heartbeat   . Ovarian cyst 2008  . Paroxysmal atrial fibrillation (HCC)    Palpitations; maintained on flecainide; -normal coronary angiography and 1999; negative stress nuclear study in 11/2005  . PONV (postoperative nausea and vomiting)   . Tobacco abuse, in remission    10-pack-years; quit in 2001    Past Surgical History:  Procedure Laterality Date  . ABDOMINAL HYSTERECTOMY  1983   partial  . CHOLECYSTECTOMY    . COLON SURGERY  2006   Colon cancer  . COLONOSCOPY  03/25/2012   [name], MD  . COSMETIC SURGERY  09/2007  . PARTIAL COLECTOMY  2006   adenocarcinoma  . TONSILLECTOMY      There were no vitals filed for this visit.      Subjective Assessment - 05/26/16 1410    Subjective  S: My thumb doesn't hurt as much as it did last time.   Currently in Pain? Yes   Pain Score 4    Pain Location Shoulder   Pain Orientation Right   Pain  Descriptors / Indicators Throbbing   Pain Type Chronic pain   Pain Score 2   Pain Location Neck   Pain Orientation Right   Pain Descriptors / Indicators Throbbing   Pain Type Chronic pain   Pain Score 5   Pain Location Finger (Comment which one)  thumb   Pain Orientation Right   Pain Descriptors / Indicators Throbbing   Pain Type Acute pain            [facility] OT Assessment - 05/26/16 1411      Assessment   Diagnosis Cervical pain     Precautions   Precautions None                  OT Treatments/Exercises (OP) - 05/26/16 1411      Exercises   Exercises Shoulder;Hand;Neck     Neck Exercises: Supine   Cervical Rotation Both;5 reps     Modalities   Modalities Electrical Stimulation;Moist Heat     Moist Heat Therapy   Number Minutes Moist Heat 10 Minutes   Moist Heat Location Shoulder;Cervical     Electrical Stimulation   Electrical Stimulation Location right upper trapezius   Electrical Stimulation Action interferential   Electrical Stimulation Parameters 8.8CV   Electrical Stimulation Goals Pain  Manual Therapy   Manual Therapy Myofascial release;Manual Traction   Manual therapy comments Manual therapy completed prior to exercises.   Myofascial Release Myofascial release completed to right thumb, right upper arm, trapezius, scapularis, and cervical region to decrease fascial restrictions and increase joint mobility in a pain free zone.    Manual Traction Gentle manual traction completed to cervical region.                   OT Short Term Goals - 05/21/16 1423      OT SHORT TERM GOAL #1   Title Patient will be educated and independent with HEP to increase functional mobility and comfort during RUE use.    Time 4   Period Weeks   Status On-going     OT SHORT TERM GOAL #2   Title Patient will report a decrease in pain level in right shoulder and cervical region when completing daily tasks.    Time 4   Period Weeks   Status On-going      OT SHORT TERM GOAL #3   Title Patient will increase RUE strength to 5/5 to be able to complete daily household chores with more comfort and return to mopping the floor.   Time 4   Period Weeks   Status On-going     OT SHORT TERM GOAL #4   Title Patient will decrease fascial restrictions to min amount to increase functional mobility in right shoulder and cervical region.   Time 4   Period Days   Status On-going     OT SHORT TERM GOAL #5   Title Patient will return to highest level of independence with all daily task when using RUE.   Time 4   Period Weeks   Status On-going     OT SHORT TERM GOAL #6   Title Patient will increase right grip strength by 10# and 3 point pinch by 5# to increase ability to hold onto items without dropping them.   Time 4   Period Weeks   Status On-going                  Plan - 05/26/16 1415    Clinical Impression Statement A: Added moist heat and ES at end of session for pain management. Patient with less fascial restrictions this session although continues to have trigger points along right side of cervical region.    Plan P: Continue with manual technique on right UE and cervical region. Add cervical and shoulder A/ROM exercises.       Patient will benefit from skilled therapeutic intervention in order to improve the following deficits and impairments:  Decreased strength, Decreased range of motion, Pain, Increased fascial restricitons  Visit Diagnosis: Cervicalgia  Chronic right shoulder pain  Stiffness of right shoulder, not elsewhere classified  Other symptoms and signs involving the musculoskeletal system    Problem List Patient Active Problem List   Diagnosis Date Noted  . Sciatica of right side 03/06/2015  . Knee pain, left 03/13/2014  . Pain in joint, ankle and foot 03/13/2014  . Muscle weakness (generalized) 01/10/2014  . Difficulty in walking(719.7) 01/10/2014  . Lumbago 01/10/2014  . Sciatica of left side  04/26/2013  . Chest pain at rest 03/21/2013  . Prediabetes 12/30/2012  . Paroxysmal atrial fibrillation (HCC)   . ADENOCARCINOMA, COLON 06/24/2009  . GASTROESOPHAGEAL REFLUX DISEASE 06/24/2009  . Hyperlipidemia 06/12/2009   Ailene Ravel, OTR/L,CBIS  [phone]  05/26/2016, 2:26 PM  [place]  [place] [address] [place], [place], [address] Phone: [phone]   Fax:  [phone]  Name: AMAND BAYSINGER MRN: [id number] Date of Birth: 1941-04-01

## 2016-05-28 ENCOUNTER — Encounter (HOSPITAL_COMMUNITY): Payer: Medicare Other

## 2016-05-28 ENCOUNTER — Other Ambulatory Visit: Payer: Self-pay | Admitting: Family Medicine

## 2016-05-28 DIAGNOSIS — Z1231 Encounter for screening mammogram for malignant neoplasm of breast: Secondary | ICD-10-CM

## 2016-06-02 ENCOUNTER — Encounter (HOSPITAL_COMMUNITY): Payer: Self-pay | Admitting: Occupational Therapy

## 2016-06-02 ENCOUNTER — Ambulatory Visit (HOSPITAL_COMMUNITY): Payer: Medicare Other | Admitting: Occupational Therapy

## 2016-06-02 DIAGNOSIS — G8929 Other chronic pain: Secondary | ICD-10-CM

## 2016-06-02 DIAGNOSIS — M25611 Stiffness of right shoulder, not elsewhere classified: Secondary | ICD-10-CM | POA: Diagnosis not present

## 2016-06-02 DIAGNOSIS — M25511 Pain in right shoulder: Secondary | ICD-10-CM

## 2016-06-02 DIAGNOSIS — M542 Cervicalgia: Secondary | ICD-10-CM

## 2016-06-02 DIAGNOSIS — R29898 Other symptoms and signs involving the musculoskeletal system: Secondary | ICD-10-CM

## 2016-06-02 NOTE — Patient Instructions (Signed)
  AROM: Lateral Neck Flexion   Slowly tilt head toward one shoulder, then the other. Hold each position __3-5__ seconds. Repeat __5__ times per set. Do __1__ sets per session. Do _1-2___ sessions per day.  http://orth.exer.us/296   Copyright  VHI. All rights reserved.  AROM: Neck Extension   Bend head backward. Hold __3-5__ seconds. Repeat __5__ times per set. Do __1__ sets per session. Do __1-2__ sessions per day.  http://orth.exer.us/300   Copyright  VHI. All rights reserved.  AROM: Neck Flexion   Bend head forward. Hold __3-5__ seconds. Repeat __5__ times per set. Do __1__ sets per session. Do __1-2__ sessions per day.  http://orth.exer.us/298   Copyright  VHI. All rights reserved.  AROM: Neck Rotation   Turn head slowly to look over one shoulder, then the other. Hold each position __3-5__ seconds. Repeat __5__ times per set. Do _1___ sets per session. Do _1-2___ sessions per day.  http://orth.exer.us/294   Copyright  VHI. All rights reserved.

## 2016-06-02 NOTE — Therapy (Signed)
[place], [place], [address] Phone: [phone]   Fax:  [phone]  Occupational Therapy Treatment  Patient Details  Name: [name] MRN: [id number] Date of Birth: 09-27-1940 Referring Provider: Netta Cedars, MD  Encounter Date: 06/02/2016      OT End of Session - 06/02/16 1349    Visit Number 4   Number of Visits 8   Date for OT Re-Evaluation 06/18/16   Authorization Type 1) Medicare 2) generic cigna   Authorization Time Period before 10th visit   Authorization - Visit Number 4   Authorization - Number of Visits 10   OT Start Time 1302   OT Stop Time 1346   OT Time Calculation (min) 44 min   Activity Tolerance Patient tolerated treatment well   Behavior During Therapy [facility] for tasks assessed/performed      Past Medical History:  Diagnosis Date  . Adenocarcinoma, colon ([place]) 03/2005   Stage I; resected in 03/2005  . Arthritis   . Gastroesophageal reflux disease   . Gastroparesis   . Hearing loss    mild  . Hyperlipidemia   . Irregular heartbeat   . Ovarian cyst 2008  . Paroxysmal atrial fibrillation (HCC)    Palpitations; maintained on flecainide; -normal coronary angiography and 1999; negative stress nuclear study in 11/2005  . PONV (postoperative nausea and vomiting)   . Tobacco abuse, in remission    10-pack-years; quit in 2001    Past Surgical History:  Procedure Laterality Date  . ABDOMINAL HYSTERECTOMY  1983   partial  . CHOLECYSTECTOMY    . COLON SURGERY  2006   Colon cancer  . COLONOSCOPY  03/25/2012   [name], MD  . COSMETIC SURGERY  09/2007  . PARTIAL COLECTOMY  2006   adenocarcinoma  . TONSILLECTOMY      There were no vitals filed for this visit.      Subjective Assessment - 06/02/16 1303    Subjective  S: My neck is not hurting today.    Currently in Pain? Yes   Pain Score 4    Pain Location Shoulder   Pain Orientation Right   Pain Descriptors /  Indicators Aching   Pain Type Chronic pain   Pain Radiating Towards N/A   Pain Onset More than a month ago   Pain Frequency Constant   Aggravating Factors  nothing   Pain Relieving Factors heat, pain medications   Effect of Pain on Daily Activities pushing through the pain   Multiple Pain Sites Yes   Pain Score 9   Pain Location Other (Comment)  thumb   Pain Orientation Right   Pain Descriptors / Indicators Aching;Throbbing   Pain Type Chronic pain   Pain Radiating Towards N/A   Pain Onset 1 to 4 weeks ago   Pain Frequency Intermittent   Aggravating Factors  movement   Pain Relieving Factors pain medications   Effect of Pain on Daily Activities pushes through the pain            [facility] OT Assessment - 06/02/16 1302      Assessment   Diagnosis Cervical pain     Precautions   Precautions None                  OT Treatments/Exercises (OP) - 06/02/16 1309      Exercises   Exercises Shoulder;Hand;Neck     Neck Exercises: Seated   Cervical Rotation Both;5 reps  Lateral Flexion Both;5 reps   Shoulder Rolls Backwards;Forwards;5 reps     Shoulder Exercises: Supine   Protraction PROM;5 reps;AROM;10 reps   Horizontal ABduction PROM;5 reps;AROM;10 reps   External Rotation PROM;5 reps;AROM;10 reps   Internal Rotation PROM;5 reps;AROM;10 reps   Flexion PROM;5 reps;AROM;10 reps   ABduction PROM;5 reps;AROM;10 reps     Manual Therapy   Manual Therapy Myofascial release;Manual Traction   Manual therapy comments Manual therapy completed prior to exercises.   Myofascial Release Myofascial release completed to right thumb, right upper arm, trapezius, scapularis, and cervical region to decrease fascial restrictions and increase joint mobility in a pain free zone.    Manual Traction --                OT Education - 06/02/16 1341    Education provided Yes   Education Details cervical A/ROM   Person(s) Educated Patient   Methods  Explanation;Demonstration;Handout   Comprehension Verbalized understanding;Returned demonstration          OT Short Term Goals - 05/21/16 1423      OT SHORT TERM GOAL #1   Title Patient will be educated and independent with HEP to increase functional mobility and comfort during RUE use.    Time 4   Period Weeks   Status On-going     OT SHORT TERM GOAL #2   Title Patient will report a decrease in pain level in right shoulder and cervical region when completing daily tasks.    Time 4   Period Weeks   Status On-going     OT SHORT TERM GOAL #3   Title Patient will increase RUE strength to 5/5 to be able to complete daily household chores with more comfort and return to mopping the floor.   Time 4   Period Weeks   Status On-going     OT SHORT TERM GOAL #4   Title Patient will decrease fascial restrictions to min amount to increase functional mobility in right shoulder and cervical region.   Time 4   Period Days   Status On-going     OT SHORT TERM GOAL #5   Title Patient will return to highest level of independence with all daily task when using RUE.   Time 4   Period Weeks   Status On-going     OT SHORT TERM GOAL #6   Title Patient will increase right grip strength by 10# and 3 point pinch by 5# to increase ability to hold onto items without dropping them.   Time 4   Period Weeks   Status On-going                  Plan - 06/02/16 1349    Clinical Impression Statement A: Added shoulder A/ROM in supine and cervical neck A/ROM while seated this session. Pt with fascial restrictions along right upper arm and upper trapezius today. Pt required verbal cuing for form and technique. Cervical A/ROM provided for HEP.    Plan P: Continue with manual therapy on RUE and cervical region, as well as A/ROM exercises, progressing to A/ROM seated when able. Follow up on cervical neck A/ROM HEP   OT Home Exercise Plan 1/9: cervical and shoulder stretches; 1/23: cervical A/ROM    Consulted and Agree with Plan of Care Patient      Patient will benefit from skilled therapeutic intervention in order to improve the following deficits and impairments:  Decreased strength, Decreased range of motion, Pain, Increased fascial restricitons  Visit Diagnosis: Cervicalgia  Chronic right shoulder pain  Stiffness of right shoulder, not elsewhere classified  Other symptoms and signs involving the musculoskeletal system    Problem List Patient Active Problem List   Diagnosis Date Noted  . Sciatica of right side 03/06/2015  . Knee pain, left 03/13/2014  . Pain in joint, ankle and foot 03/13/2014  . Muscle weakness (generalized) 01/10/2014  . Difficulty in walking(719.7) 01/10/2014  . Lumbago 01/10/2014  . Sciatica of left side 04/26/2013  . Chest pain at rest 03/21/2013  . Prediabetes 12/30/2012  . Paroxysmal atrial fibrillation (HCC)   . ADENOCARCINOMA, COLON 06/24/2009  . GASTROESOPHAGEAL REFLUX DISEASE 06/24/2009  . Hyperlipidemia 06/12/2009   Guadelupe Sabin, OTR/L  [phone] 06/02/2016, 1:52 PM  [place] [address] [place], [place], [address] Phone: [phone]   Fax:  [phone]  Name: JERSI MANOCCHIO MRN: [id number] Date of Birth: 1940-07-12

## 2016-06-04 ENCOUNTER — Ambulatory Visit (HOSPITAL_COMMUNITY): Payer: Medicare Other

## 2016-06-04 ENCOUNTER — Encounter (HOSPITAL_COMMUNITY): Payer: Self-pay

## 2016-06-04 DIAGNOSIS — M25611 Stiffness of right shoulder, not elsewhere classified: Secondary | ICD-10-CM | POA: Diagnosis not present

## 2016-06-04 DIAGNOSIS — M25511 Pain in right shoulder: Secondary | ICD-10-CM

## 2016-06-04 DIAGNOSIS — R29898 Other symptoms and signs involving the musculoskeletal system: Secondary | ICD-10-CM

## 2016-06-04 DIAGNOSIS — M542 Cervicalgia: Secondary | ICD-10-CM | POA: Diagnosis not present

## 2016-06-04 DIAGNOSIS — G8929 Other chronic pain: Secondary | ICD-10-CM

## 2016-06-04 NOTE — Therapy (Signed)
[place], [place], [address] Phone: [phone]   Fax:  [phone]  Occupational Therapy Treatment  Patient Details  Name: [name] MRN: [id number] Date of Birth: [birth year] Referring Provider: Netta Cedars, MD  Encounter Date: 06/04/2016      OT End of Session - 06/04/16 1359    Visit Number 5   Number of Visits 8   Date for OT Re-Evaluation 06/18/16   Authorization Type 1) Medicare 2) generic cigna   Authorization Time Period before 10th visit   Authorization - Visit Number 5   Authorization - Number of Visits 10   OT Start Time 1300   OT Stop Time 1345   OT Time Calculation (min) 45 min   Activity Tolerance Patient tolerated treatment well   Behavior During Therapy [facility] for tasks assessed/performed      Past Medical History:  Diagnosis Date  . Adenocarcinoma, colon ([place]) 03/2005   Stage I; resected in 03/2005  . Arthritis   . Gastroesophageal reflux disease   . Gastroparesis   . Hearing loss    mild  . Hyperlipidemia   . Irregular heartbeat   . Ovarian cyst 2008  . Paroxysmal atrial fibrillation (HCC)    Palpitations; maintained on flecainide; -normal coronary angiography and 1999; negative stress nuclear study in 11/2005  . PONV (postoperative nausea and vomiting)   . Tobacco abuse, in remission    10-pack-years; quit in 2001    Past Surgical History:  Procedure Laterality Date  . ABDOMINAL HYSTERECTOMY  1983   partial  . CHOLECYSTECTOMY    . COLON SURGERY  2006   Colon cancer  . COLONOSCOPY  03/25/2012   [name], MD  . COSMETIC SURGERY  09/2007  . PARTIAL COLECTOMY  2006   adenocarcinoma  . TONSILLECTOMY      There were no vitals filed for this visit.      Subjective Assessment - 06/04/16 1335    Subjective  S: I don't know why my thumb is hurting. Do you think it's related to my neck?   Currently in Pain? Yes   Pain Score 4    Pain Location Shoulder   Pain  Orientation Right   Pain Descriptors / Indicators Aching   Pain Type Chronic pain   Pain Score 8   Pain Location Finger (Comment which one)  thumb   Pain Orientation Right   Pain Descriptors / Indicators Aching;Throbbing   Pain Type Acute pain   Pain Score 2   Pain Location Neck   Pain Orientation Right   Pain Descriptors / Indicators Throbbing   Pain Type Acute pain            [facility] OT Assessment - 06/04/16 1336      Assessment   Diagnosis Cervical pain     Precautions   Precautions None                  OT Treatments/Exercises (OP) - 06/04/16 1337      Exercises   Exercises Shoulder;Hand;Neck     Neck Exercises: Stretches   Upper Trapezius Stretch 3 reps;10 seconds   Levator Stretch 3 reps;10 seconds     Neck Exercises: Seated   Cervical Rotation Both;5 reps   Lateral Flexion Both;5 reps     Shoulder Exercises: Supine   Protraction PROM;5 reps;AROM;10 reps   Horizontal ABduction PROM;5 reps;AROM;10 reps   External Rotation PROM;5 reps;AROM;10 reps   Internal Rotation  PROM;5 reps;AROM;10 reps   Flexion PROM;5 reps;AROM;10 reps   ABduction PROM;5 reps;AROM;10 reps     Manual Therapy   Manual Therapy Myofascial release;Manual Traction   Manual therapy comments Manual therapy completed prior to exercises.   Myofascial Release Myofascial release completed to right thumb, right upper arm, trapezius, scapularis, and cervical region to decrease fascial restrictions and increase joint mobility in a pain free zone.    Manual Traction Gentle manual traction completed to cervical region.                   OT Short Term Goals - 05/21/16 1423      OT SHORT TERM GOAL #1   Title Patient will be educated and independent with HEP to increase functional mobility and comfort during RUE use.    Time 4   Period Weeks   Status On-going     OT SHORT TERM GOAL #2   Title Patient will report a decrease in pain level in right shoulder and cervical region  when completing daily tasks.    Time 4   Period Weeks   Status On-going     OT SHORT TERM GOAL #3   Title Patient will increase RUE strength to 5/5 to be able to complete daily household chores with more comfort and return to mopping the floor.   Time 4   Period Weeks   Status On-going     OT SHORT TERM GOAL #4   Title Patient will decrease fascial restrictions to min amount to increase functional mobility in right shoulder and cervical region.   Time 4   Period Days   Status On-going     OT SHORT TERM GOAL #5   Title Patient will return to highest level of independence with all daily task when using RUE.   Time 4   Period Weeks   Status On-going     OT SHORT TERM GOAL #6   Title Patient will increase right grip strength by 10# and 3 point pinch by 5# to increase ability to hold onto items without dropping them.   Time 4   Period Weeks   Status On-going                  Plan - 06/04/16 1359    Clinical Impression Statement A: Unable to complete seated A/ROM shoulder exercises due to time constraint. Patient presents today with less fascial restrictions compared to initial evaluation. pt continues to have a significantly large trigger point on her right lateral cervical region.   Plan P: Continue with manual therapy on RUE and cervical region. Progress to A/ROM seated as able.       Patient will benefit from skilled therapeutic intervention in order to improve the following deficits and impairments:  Decreased strength, Decreased range of motion, Pain, Increased fascial restricitons  Visit Diagnosis: Cervicalgia  Chronic right shoulder pain  Stiffness of right shoulder, not elsewhere classified  Other symptoms and signs involving the musculoskeletal system    Problem List Patient Active Problem List   Diagnosis Date Noted  . Sciatica of right side 03/06/2015  . Knee pain, left 03/13/2014  . Pain in joint, ankle and foot 03/13/2014  . Muscle weakness  (generalized) 01/10/2014  . Difficulty in walking(719.7) 01/10/2014  . Lumbago 01/10/2014  . Sciatica of left side 04/26/2013  . Chest pain at rest 03/21/2013  . Prediabetes 12/30/2012  . Paroxysmal atrial fibrillation (HCC)   . ADENOCARCINOMA, COLON 06/24/2009  . GASTROESOPHAGEAL  REFLUX DISEASE 06/24/2009  . Hyperlipidemia 06/12/2009   Ailene Ravel, OTR/L,CBIS  [phone]  06/04/2016, 2:07 PM  [place] [address] [place], [place], [address] Phone: [phone]   Fax:  [phone]  Name: [name] MRN: [id number] Date of Birth: 1941/03/03

## 2016-06-09 ENCOUNTER — Encounter (HOSPITAL_COMMUNITY): Payer: Self-pay | Admitting: Occupational Therapy

## 2016-06-09 ENCOUNTER — Ambulatory Visit (HOSPITAL_COMMUNITY): Payer: Medicare Other | Admitting: Occupational Therapy

## 2016-06-09 DIAGNOSIS — R29898 Other symptoms and signs involving the musculoskeletal system: Secondary | ICD-10-CM | POA: Diagnosis not present

## 2016-06-09 DIAGNOSIS — M25611 Stiffness of right shoulder, not elsewhere classified: Secondary | ICD-10-CM | POA: Diagnosis not present

## 2016-06-09 DIAGNOSIS — M542 Cervicalgia: Secondary | ICD-10-CM

## 2016-06-09 DIAGNOSIS — M25511 Pain in right shoulder: Secondary | ICD-10-CM

## 2016-06-09 DIAGNOSIS — G8929 Other chronic pain: Secondary | ICD-10-CM | POA: Diagnosis not present

## 2016-06-09 NOTE — Therapy (Signed)
[place], [place], [address] Phone: [phone]   Fax:  [phone]  Occupational Therapy Treatment  Patient Details  Name: [name] MRN: [id number] Date of Birth: Dec 25, 1940 Referring Provider: Netta Cedars, MD  Encounter Date: 06/09/2016      OT End of Session - 06/09/16 1441    Visit Number 6   Number of Visits 8   Date for OT Re-Evaluation 06/18/16   Authorization Type 1) Medicare 2) generic cigna   Authorization Time Period before 10th visit   Authorization - Visit Number 6   Authorization - Number of Visits 10   OT Start Time 1303   OT Stop Time 1340   OT Time Calculation (min) 37 min   Activity Tolerance Patient tolerated treatment well   Behavior During Therapy [facility] for tasks assessed/performed      Past Medical History:  Diagnosis Date  . Adenocarcinoma, colon ([place]) 03/2005   Stage I; resected in 03/2005  . Arthritis   . Gastroesophageal reflux disease   . Gastroparesis   . Hearing loss    mild  . Hyperlipidemia   . Irregular heartbeat   . Ovarian cyst 2008  . Paroxysmal atrial fibrillation (HCC)    Palpitations; maintained on flecainide; -normal coronary angiography and 1999; negative stress nuclear study in 11/2005  . PONV (postoperative nausea and vomiting)   . Tobacco abuse, in remission    10-pack-years; quit in 2001    Past Surgical History:  Procedure Laterality Date  . ABDOMINAL HYSTERECTOMY  1983   partial  . CHOLECYSTECTOMY    . COLON SURGERY  2006   Colon cancer  . COLONOSCOPY  03/25/2012   [name], MD  . COSMETIC SURGERY  09/2007  . PARTIAL COLECTOMY  2006   adenocarcinoma  . TONSILLECTOMY      There were no vitals filed for this visit.      Subjective Assessment - 06/09/16 1302    Subjective  S: My neck doesn't hurt at all.    Currently in Pain? Yes   Pain Score 1    Pain Location Shoulder   Pain Orientation Right   Pain Descriptors /  Indicators Aching   Pain Type Chronic pain   Pain Radiating Towards n/a   Pain Onset More than a month ago   Pain Frequency Constant   Aggravating Factors  nothing   Pain Relieving Factors heat, pain medications   Effect of Pain on Daily Activities pushing through the pain   Multiple Pain Sites Yes   Pain Score 4   Pain Location Finger (Comment which one)  thumb   Pain Orientation Right   Pain Descriptors / Indicators Aching   Pain Type Acute pain   Pain Radiating Towards n/a   Pain Onset 1 to 4 weeks ago   Pain Frequency Intermittent   Aggravating Factors  movement   Pain Relieving Factors pain medications   Effect of Pain on Daily Activities pushes through the pain            [facility] OT Assessment - 06/09/16 1302      Assessment   Diagnosis Cervical pain     Precautions   Precautions None                  OT Treatments/Exercises (OP) - 06/09/16 1308      Exercises   Exercises Shoulder;Hand;Neck     Neck Exercises: Stretches   Upper Trapezius Stretch  3 reps;10 seconds   Levator Stretch 3 reps;10 seconds     Neck Exercises: Seated   Cervical Rotation Both;5 reps   Lateral Flexion Both;5 reps     Shoulder Exercises: Supine   Protraction PROM;5 reps   Horizontal ABduction PROM;5 reps   External Rotation PROM;5 reps   Internal Rotation PROM;5 reps   Flexion PROM;5 reps   ABduction PROM;5 reps     Shoulder Exercises: Seated   Protraction AROM;10 reps   Horizontal ABduction AROM;10 reps   External Rotation AROM;10 reps   Internal Rotation AROM;10 reps   Flexion AROM;10 reps   Abduction AROM;10 reps     Shoulder Exercises: Standing   Extension Theraband;10 reps   Theraband Level (Shoulder Extension) Level 2 (Red)   Row Theraband;10 reps   Theraband Level (Shoulder Row) Level 2 (Red)     Modalities   Modalities Electrical Stimulation;Moist Heat     Moist Heat Therapy   Number Minutes Moist Heat 10 Minutes   Moist Heat Location  Shoulder;Cervical     Electrical Stimulation   Electrical Stimulation Location right upper trapezius   Electrical Stimulation Action interferential   Electrical Stimulation Parameters 13.0 CV   Electrical Stimulation Goals Pain     Manual Therapy   Manual Therapy Myofascial release;Manual Traction   Manual therapy comments Manual therapy completed prior to exercises.   Myofascial Release Myofascial release completed to right thumb, right upper arm, trapezius, scapularis, and cervical region to decrease fascial restrictions and increase joint mobility in a pain free zone.                   OT Short Term Goals - 05/21/16 1423      OT SHORT TERM GOAL #1   Title Patient will be educated and independent with HEP to increase functional mobility and comfort during RUE use.    Time 4   Period Weeks   Status On-going     OT SHORT TERM GOAL #2   Title Patient will report a decrease in pain level in right shoulder and cervical region when completing daily tasks.    Time 4   Period Weeks   Status On-going     OT SHORT TERM GOAL #3   Title Patient will increase RUE strength to 5/5 to be able to complete daily household chores with more comfort and return to mopping the floor.   Time 4   Period Weeks   Status On-going     OT SHORT TERM GOAL #4   Title Patient will decrease fascial restrictions to min amount to increase functional mobility in right shoulder and cervical region.   Time 4   Period Days   Status On-going     OT SHORT TERM GOAL #5   Title Patient will return to highest level of independence with all daily task when using RUE.   Time 4   Period Weeks   Status On-going     OT SHORT TERM GOAL #6   Title Patient will increase right grip strength by 10# and 3 point pinch by 5# to increase ability to hold onto items without dropping them.   Time 4   Period Weeks   Status On-going                  Plan - 06/09/16 1441    Clinical Impression Statement  A: Added seated shoulder A/ROM exercises this session, pt reports increased pain in right upper arm at end range  flexion. Added scapular theraband, pt with increased pain however able to tolerate. Pt requested TENS unit at end of session for pain relief, pt reports no pain at end of session.    Plan P: Continue with scapular theraband adding retraction when able to tolerate. Add proximal shoulder strengthening in supine, continue with cervical and shoulder stretches   OT Home Exercise Plan 1/9: cervical and shoulder stretches; 1/23: cervical A/ROM   Consulted and Agree with Plan of Care Patient      Patient will benefit from skilled therapeutic intervention in order to improve the following deficits and impairments:  Decreased strength, Decreased range of motion, Pain, Increased fascial restricitons  Visit Diagnosis: Cervicalgia  Chronic right shoulder pain  Stiffness of right shoulder, not elsewhere classified  Other symptoms and signs involving the musculoskeletal system    Problem List Patient Active Problem List   Diagnosis Date Noted  . Sciatica of right side 03/06/2015  . Knee pain, left 03/13/2014  . Pain in joint, ankle and foot 03/13/2014  . Muscle weakness (generalized) 01/10/2014  . Difficulty in walking(719.7) 01/10/2014  . Lumbago 01/10/2014  . Sciatica of left side 04/26/2013  . Chest pain at rest 03/21/2013  . Prediabetes 12/30/2012  . Paroxysmal atrial fibrillation (HCC)   . ADENOCARCINOMA, COLON 06/24/2009  . GASTROESOPHAGEAL REFLUX DISEASE 06/24/2009  . Hyperlipidemia 06/12/2009   Guadelupe Sabin, OTR/L  [phone] 06/09/2016, 2:47 PM  [facility] [address] [place], [place], [address] Phone: [phone]   Fax:  [phone]  Name: MARTHA GLENDE MRN: [id number] Date of Birth: 1940-06-23

## 2016-06-11 ENCOUNTER — Ambulatory Visit (HOSPITAL_COMMUNITY): Payer: Medicare Other | Attending: Orthopedic Surgery

## 2016-06-11 ENCOUNTER — Encounter (HOSPITAL_COMMUNITY): Payer: Self-pay

## 2016-06-11 DIAGNOSIS — M542 Cervicalgia: Secondary | ICD-10-CM | POA: Diagnosis not present

## 2016-06-11 DIAGNOSIS — G8929 Other chronic pain: Secondary | ICD-10-CM | POA: Diagnosis not present

## 2016-06-11 DIAGNOSIS — R29898 Other symptoms and signs involving the musculoskeletal system: Secondary | ICD-10-CM | POA: Insufficient documentation

## 2016-06-11 DIAGNOSIS — M25611 Stiffness of right shoulder, not elsewhere classified: Secondary | ICD-10-CM | POA: Insufficient documentation

## 2016-06-11 DIAGNOSIS — M25511 Pain in right shoulder: Secondary | ICD-10-CM | POA: Diagnosis not present

## 2016-06-11 NOTE — Therapy (Signed)
[place], [place], [address] Phone: [phone]   Fax:  [phone]  Occupational Therapy Treatment  Patient Details  Name: [name] MRN: [id number] Date of Birth: December 09, 1940 Referring Provider: Netta Cedars, MD  Encounter Date: 06/11/2016      OT End of Session - 06/11/16 1336    Visit Number 7   Number of Visits 8   Date for OT Re-Evaluation 06/18/16   Authorization Type 1) Medicare 2) generic cigna   Authorization Time Period before 10th visit   Authorization - Visit Number 7   Authorization - Number of Visits 10   OT Start Time 1300   OT Stop Time 1345   OT Time Calculation (min) 45 min   Activity Tolerance Patient tolerated treatment well   Behavior During Therapy [facility] for tasks assessed/performed      Past Medical History:  Diagnosis Date  . Adenocarcinoma, colon ([place]) 03/2005   Stage I; resected in 03/2005  . Arthritis   . Gastroesophageal reflux disease   . Gastroparesis   . Hearing loss    mild  . Hyperlipidemia   . Irregular heartbeat   . Ovarian cyst 2008  . Paroxysmal atrial fibrillation (HCC)    Palpitations; maintained on flecainide; -normal coronary angiography and 1999; negative stress nuclear study in 11/2005  . PONV (postoperative nausea and vomiting)   . Tobacco abuse, in remission    10-pack-years; quit in 2001    Past Surgical History:  Procedure Laterality Date  . ABDOMINAL HYSTERECTOMY  1983   partial  . CHOLECYSTECTOMY    . COLON SURGERY  2006   Colon cancer  . COLONOSCOPY  03/25/2012   [name], MD  . COSMETIC SURGERY  09/2007  . PARTIAL COLECTOMY  2006   adenocarcinoma  . TONSILLECTOMY      There were no vitals filed for this visit.      Subjective Assessment - 06/11/16 1332    Subjective  S: MY neck hasn't been hurting me. My shoulder is still sore. My thumb is still hurting but it's getting better though.   Currently in Pain? Yes   Pain Score  4    Pain Location Shoulder   Pain Orientation Right   Pain Descriptors / Indicators Aching   Pain Type Chronic pain   Pain Radiating Towards N/A   Pain Onset More than a month ago            [facility] OT Assessment - 06/11/16 1334      Assessment   Diagnosis Cervical pain     Precautions   Precautions None                  OT Treatments/Exercises (OP) - 06/11/16 1333      Exercises   Exercises Shoulder     Shoulder Exercises: Supine   Protraction PROM;5 reps   Horizontal ABduction PROM;5 reps   External Rotation PROM;5 reps   Internal Rotation PROM;5 reps   Flexion PROM;5 reps   ABduction PROM;5 reps     Shoulder Exercises: Seated   Protraction AROM;12 reps   Horizontal ABduction AROM;12 reps   External Rotation AROM;12 reps   Internal Rotation AROM;12 reps   Flexion AROM;12 reps   Abduction AROM;12 reps     Shoulder Exercises: Standing   Extension Theraband;10 reps   Theraband Level (Shoulder Extension) Level 2 (Red)   Row Theraband;10 reps   Theraband Level (Shoulder Row) Level  2 (Red)     Shoulder Exercises: Stretch   Other Shoulder Stretches Doorway stretch; 2 sets 10 second hold     Manual Therapy   Manual Therapy Myofascial release   Manual therapy comments Manual therapy completed prior to exercises.   Myofascial Release Myofascial release completed to right thumb, right upper arm, trapezius, scapularis, and cervical region to decrease fascial restrictions and increase joint mobility in a pain free zone.                   OT Short Term Goals - 05/21/16 1423      OT SHORT TERM GOAL #1   Title Patient will be educated and independent with HEP to increase functional mobility and comfort during RUE use.    Time 4   Period Weeks   Status On-going     OT SHORT TERM GOAL #2   Title Patient will report a decrease in pain level in right shoulder and cervical region when completing daily tasks.    Time 4   Period Weeks   Status  On-going     OT SHORT TERM GOAL #3   Title Patient will increase RUE strength to 5/5 to be able to complete daily household chores with more comfort and return to mopping the floor.   Time 4   Period Weeks   Status On-going     OT SHORT TERM GOAL #4   Title Patient will decrease fascial restrictions to min amount to increase functional mobility in right shoulder and cervical region.   Time 4   Period Days   Status On-going     OT SHORT TERM GOAL #5   Title Patient will return to highest level of independence with all daily task when using RUE.   Time 4   Period Weeks   Status On-going     OT SHORT TERM GOAL #6   Title Patient will increase right grip strength by 10# and 3 point pinch by 5# to increase ability to hold onto items without dropping them.   Time 4   Period Weeks   Status On-going                  Plan - 06/11/16 1401    Clinical Impression Statement A: Increased repetitions to 12 repetitions during seated A/ROM. Pt was educated on a modification for her corner stretch to increase the intensity. VC for from and technique. Recommendations to modify stretch to decrease low back pain. Briefly discussed body mechanics during cleaning tasks (ie. cleaning the tub) as patient reports she bends all the way over to clean it and it causes back pain.   Plan P: Reassess. Determine if patient needs to continue therapy.      Patient will benefit from skilled therapeutic intervention in order to improve the following deficits and impairments:  Decreased strength, Decreased range of motion, Pain, Increased fascial restricitons  Visit Diagnosis: Cervicalgia  Stiffness of right shoulder, not elsewhere classified  Other symptoms and signs involving the musculoskeletal system  Chronic right shoulder pain    Problem List Patient Active Problem List   Diagnosis Date Noted  . Sciatica of right side 03/06/2015  . Knee pain, left 03/13/2014  . Pain in joint, ankle and  foot 03/13/2014  . Muscle weakness (generalized) 01/10/2014  . Difficulty in walking(719.7) 01/10/2014  . Lumbago 01/10/2014  . Sciatica of left side 04/26/2013  . Chest pain at rest 03/21/2013  . Prediabetes 12/30/2012  . Paroxysmal  atrial fibrillation ([place])   . ADENOCARCINOMA, COLON 06/24/2009  . GASTROESOPHAGEAL REFLUX DISEASE 06/24/2009  . Hyperlipidemia 06/12/2009   Ailene Ravel, OTR/L,CBIS  [phone]  06/11/2016, 2:05 PM  [facility] [address] [place], [place], [address] Phone: [phone]   Fax:  [phone]  Name: [name] MRN: [id number] Date of Birth: [birth year]

## 2016-06-12 ENCOUNTER — Telehealth: Payer: Self-pay | Admitting: Family Medicine

## 2016-06-12 NOTE — Telephone Encounter (Signed)
Patient is requesting a nurse to call her back and discuss her Crestor.  She said she was told to call today and speak with someone.

## 2016-06-12 NOTE — Telephone Encounter (Signed)
Patient states that she was having very bad hand pain and thought it may be coming from the Crestor. Patient states that Dr. Nicki Reaper told her at her last visit to stop the Crestor. She states that her hands are not hurting as bad as they were, but she is going to restart the Crestor and if the pain come back then she will know for sure that was the problem. If so, she will call back and ask for this med to be changed.

## 2016-06-13 ENCOUNTER — Other Ambulatory Visit: Payer: Self-pay | Admitting: Cardiology

## 2016-06-16 ENCOUNTER — Ambulatory Visit (HOSPITAL_COMMUNITY): Payer: Medicare Other | Admitting: Occupational Therapy

## 2016-06-16 DIAGNOSIS — M25611 Stiffness of right shoulder, not elsewhere classified: Secondary | ICD-10-CM | POA: Diagnosis not present

## 2016-06-16 DIAGNOSIS — G8929 Other chronic pain: Secondary | ICD-10-CM | POA: Diagnosis not present

## 2016-06-16 DIAGNOSIS — M25511 Pain in right shoulder: Secondary | ICD-10-CM

## 2016-06-16 DIAGNOSIS — M542 Cervicalgia: Secondary | ICD-10-CM

## 2016-06-16 DIAGNOSIS — R29898 Other symptoms and signs involving the musculoskeletal system: Secondary | ICD-10-CM | POA: Diagnosis not present

## 2016-06-16 NOTE — Therapy (Addendum)
[place], [place], [address] Phone: [phone]   Fax:  [phone]  Occupational Therapy Treatment  Patient Details  Name: [name] MRN: [id number] Date of Birth: 23-Jan-1941 Referring Provider: Netta Cedars, MD  Encounter Date: 06/16/2016      OT End of Session - 06/16/16 1450    Visit Number 8   Number of Visits 8   Date for OT Re-Evaluation 06/18/16   Authorization Type 1) Medicare 2) generic cigna   Authorization Time Period before 18th visit   Authorization - Visit Number 8   Authorization - Number of Visits 18   OT Start Time 3710  pt arrived late   OT Stop Time 1337   OT Time Calculation (min) 28 min   Activity Tolerance Patient tolerated treatment well   Behavior During Therapy [facility] for tasks assessed/performed      Past Medical History:  Diagnosis Date  . Adenocarcinoma, colon ([place]) 03/2005   Stage I; resected in 03/2005  . Arthritis   . Gastroesophageal reflux disease   . Gastroparesis   . Hearing loss    mild  . Hyperlipidemia   . Irregular heartbeat   . Ovarian cyst 2008  . Paroxysmal atrial fibrillation (HCC)    Palpitations; maintained on flecainide; -normal coronary angiography and 1999; negative stress nuclear study in 11/2005  . PONV (postoperative nausea and vomiting)   . Tobacco abuse, in remission    10-pack-years; quit in 2001    Past Surgical History:  Procedure Laterality Date  . ABDOMINAL HYSTERECTOMY  1983   partial  . CHOLECYSTECTOMY    . COLON SURGERY  2006   Colon cancer  . COLONOSCOPY  03/25/2012   [name], MD  . COSMETIC SURGERY  09/2007  . PARTIAL COLECTOMY  2006   adenocarcinoma  . TONSILLECTOMY      There were no vitals filed for this visit.      Subjective Assessment - 06/16/16 1310    Subjective  S: My shoulder and neck were really hurting me yesterday.    Currently in Pain? Yes   Pain Score 2    Pain Location Shoulder   Pain  Orientation Right   Pain Descriptors / Indicators Aching   Pain Type Chronic pain   Pain Radiating Towards n/a   Pain Onset More than a month ago   Pain Frequency Constant   Aggravating Factors  nothing   Pain Relieving Factors heat, pain medications   Effect of Pain on Daily Activities pushing through the pain   Multiple Pain Sites No            [facility] OT Assessment - 06/16/16 1312      Assessment   Diagnosis Cervical pain     Precautions   Precautions None     Palpation   Palpation comment Mod fascial restrictions in right upper arm, trapezius, scapularis and right lateral cervical region.     AROM   Overall AROM Comments Assessed seated. IR/er adducted   Right Shoulder Flexion 180 Degrees  160 previous   Right Shoulder ABduction 180 Degrees  160 previous   Right Shoulder Internal Rotation 90 Degrees  same as previous   Right Shoulder External Rotation 90 Degrees  75 previous   Cervical Flexion 80  same as previous   Cervical Extension 65  60 previous   Cervical - Right Side Bend 38  35 previous   Cervical - Left Side Bend 42  35 previous   Cervical - Right Rotation 75  70 previous   Cervical - Left Rotation 75  90 previous     PROM   Overall PROM  Within functional limits for tasks performed     Strength   Overall Strength Comments Assessed seated. IR/er adducted   Right Shoulder Flexion 5/5  4+/5 previous   Right Shoulder ABduction 5/5  4+/5 previous   Right Shoulder Internal Rotation 5/5  same as previous   Right Shoulder External Rotation 5/5  same as previous   Right Hand Grip (lbs) 35  30 previous   Right Hand 3 Point Pinch 6 lbs  4 previous                  OT Treatments/Exercises (OP) - 06/16/16 1312      Exercises   Exercises Shoulder     Modalities   Modalities Electrical Stimulation;Moist Heat     Moist Heat Therapy   Number Minutes Moist Heat 10 Minutes   Moist Heat Location Shoulder;Cervical     Electrical  Stimulation   Electrical Stimulation Location right upper trapezius   Electrical Stimulation Action interferential   Electrical Stimulation Parameters 14.5 CV   Electrical Stimulation Goals Pain     Manual Therapy   Manual Therapy Myofascial release   Manual therapy comments Manual therapy completed prior to exercises.   Myofascial Release Myofascial release completed to right thumb, right upper arm, trapezius, scapularis, and cervical region to decrease fascial restrictions and increase joint mobility in a pain free zone.                   OT Short Term Goals - 06/16/16 1320      OT SHORT TERM GOAL #1   Title Patient will be educated and independent with HEP to increase functional mobility and comfort during RUE use.    Time 4   Period Weeks   Status Achieved     OT SHORT TERM GOAL #2   Title Patient will report a decrease in pain level in right shoulder and cervical region when completing daily tasks.    Time 4   Period Weeks   Status On-going     OT SHORT TERM GOAL #3   Title Patient will increase RUE strength to 5/5 to be able to complete daily household chores with more comfort and return to mopping the floor.   Time 4   Period Weeks   Status Achieved     OT SHORT TERM GOAL #4   Title Patient will decrease fascial restrictions to min amount to increase functional mobility in right shoulder and cervical region.   Time 4   Period Days   Status On-going     OT SHORT TERM GOAL #5   Title Patient will return to highest level of independence with all daily task when using RUE.   Time 4   Period Weeks   Status On-going     OT SHORT TERM GOAL #6   Title Patient will increase right grip strength by 10# and 3 point pinch by 5# to increase ability to hold onto items without dropping them.   Time 4   Period Weeks   Status On-going                  Plan - 06/16/16 1450    Clinical Impression Statement A: Reassessment completed today, pt has met 2/6  goals, with improvements in range of motion and strength,  however pain continues to be constant. Pt is using her RUE for all tasks, reports she is "pushing through the pain." Discussed various options with pt including continuing therapy, discharging with HEP, or holding until she sees the MD again. Pt would like to hold therapy until after she goes back to the MD and determines if she will need and injection or if additional testing will be completed.    Plan P: Hold therapy until after MD appt. Pt will call to let us know if she will be resuming or discharging.    OT Home Exercise Plan 1/9: cervical and shoulder stretches; 1/23: cervical A/ROM   Consulted and Agree with Plan of Care Patient      Patient will benefit from skilled therapeutic intervention in order to improve the following deficits and impairments:  Decreased strength, Decreased range of motion, Pain, Increased fascial restricitons  Visit Diagnosis: Cervicalgia  Stiffness of right shoulder, not elsewhere classified  Other symptoms and signs involving the musculoskeletal system  Chronic right shoulder pain      G-Codes - July 12, 2016 1453    Functional Assessment Tool Used clinical judgement   Functional Limitation Carrying, moving and handling objects       Carrying, Moving and Handling Objects Goal Status ([id number])   Carrying, Moving and Handling Objects Discharge Status At least 20 percent but less than 40 percent impaired, limited or restricted  At lease 40 percent but less than 60 percent impaired, limited, or restricted      Problem List Patient Active Problem List   Diagnosis Date Noted  . Sciatica of right side 03/06/2015  . Knee pain, left 03/13/2014  . Pain in joint, ankle and foot 03/13/2014  . Muscle weakness (generalized) 01/10/2014  . Difficulty in walking(719.7) 01/10/2014  . Lumbago 01/10/2014  . Sciatica of left side 04/26/2013  . Chest pain at rest 03/21/2013  . Prediabetes 12/30/2012  .  Paroxysmal atrial fibrillation (HCC)   . ADENOCARCINOMA, COLON 06/24/2009  . GASTROESOPHAGEAL REFLUX DISEASE 06/24/2009  . Hyperlipidemia 06/12/2009   Guadelupe Sabin, OTR/L  [phone] 07-12-2016, 2:55 PM  [facility] [address] [place], [place], [address] Phone: [phone]   Fax:  [phone]  Name: [name] MRN: [id number] Date of Birth: 30-Sep-1940    OCCUPATIONAL THERAPY DISCHARGE SUMMARY  Visits from Start of Care: 8  Current functional level related to goals / functional outcomes: See above. Pt did not call or return to therapy after MD appt. At last visit pt had achieved 2/6 goals, continued to be pain limited with ADL completion.    Remaining deficits: Pain limiting B/IADL completion using RUE.    Education / Equipment: N/A Plan: Patient agrees to discharge.  Patient goals were partially met. Patient is being discharged due to not returning since the last visit.  ?????

## 2016-06-29 ENCOUNTER — Ambulatory Visit (INDEPENDENT_AMBULATORY_CARE_PROVIDER_SITE_OTHER): Payer: Medicare Other | Admitting: Otolaryngology

## 2016-06-29 DIAGNOSIS — R490 Dysphonia: Secondary | ICD-10-CM

## 2016-06-29 DIAGNOSIS — K219 Gastro-esophageal reflux disease without esophagitis: Secondary | ICD-10-CM

## 2016-06-29 DIAGNOSIS — H6983 Other specified disorders of Eustachian tube, bilateral: Secondary | ICD-10-CM | POA: Diagnosis not present

## 2016-06-30 ENCOUNTER — Encounter: Payer: Self-pay | Admitting: Cardiology

## 2016-06-30 ENCOUNTER — Ambulatory Visit (INDEPENDENT_AMBULATORY_CARE_PROVIDER_SITE_OTHER): Payer: Medicare Other | Admitting: Cardiology

## 2016-06-30 VITALS — BP 102/60 | HR 67 | Ht 62.0 in | Wt 113.0 lb

## 2016-06-30 DIAGNOSIS — R0789 Other chest pain: Secondary | ICD-10-CM

## 2016-06-30 DIAGNOSIS — I480 Paroxysmal atrial fibrillation: Secondary | ICD-10-CM | POA: Diagnosis not present

## 2016-06-30 DIAGNOSIS — E782 Mixed hyperlipidemia: Secondary | ICD-10-CM | POA: Diagnosis not present

## 2016-06-30 NOTE — Progress Notes (Signed)
Clinical Summary [name] is a 76 y.o.female seen today for follow up of the following medical problems.   1. Afib  - has done well on flecanide for severla years - no recent palpitations - compliant with meds - no bleeding troubles on eliquis.     2. Atypical chest pain - denies any recent symptoms. Previous stress test was low risk.    3. Hyperlipidemia - Jan 2018: TC 176 TG 190 HDL 61 LDL 77 - compliant with statin   SH: husband with 3 surgeries recently. She has been helping at home.  Past Medical History:  Diagnosis Date  . Adenocarcinoma, colon ([place]) 03/2005   Stage I; resected in 03/2005  . Arthritis   . Gastroesophageal reflux disease   . Gastroparesis   . Hearing loss    mild  . Hyperlipidemia   . Irregular heartbeat   . Ovarian cyst 2008  . Paroxysmal atrial fibrillation (H[facility])    Palpitations; maintained on flecainide; -normal coronary angiography and 1999; negative stress nuclear study in 11/2005  . PONV (postoperative nausea and vomiting)   . Tobacco abuse, in remission    10-pack-years; quit in 2001     No Known Allergies   Current Outpatient Prescriptions  Medication Sig Dispense Refill  . Calcium Citrate (CITRACAL PO) Take 1 tablet by mouth every morning.     Marland Kitchen ELIQUIS 5 MG TABS tablet TAKE ONE TABLET TWICE DAILY 60 tablet 6  . flecainide (TAMBOCOR) 100 MG tablet TAKE ONE TABLET EVERY 12 HOURS 180 tablet 3  . gabapentin (NEURONTIN) 100 MG capsule Take 200 mg by mouth 3 (three) times daily.   2  . Lactulose SOLN Take 30 mLs by mouth at bedtime.     . Omega-3 Fatty Acids (FISH OIL) 1000 MG CAPS Take 1,000 mg by mouth daily.    . rosuvastatin (CRESTOR) 40 MG tablet TAKE ONE (1) TABLET EACH DAY 30 tablet 5   No current facility-administered medications for this visit.      Past Surgical History:  Procedure Laterality Date  . ABDOMINAL HYSTERECTOMY  1983   partial  . CHOLECYSTECTOMY    . COLON SURGERY  2006   Colon cancer  .  COLONOSCOPY  03/25/2012   [name], MD  . COSMETIC SURGERY  09/2007  . PARTIAL COLECTOMY  2006   adenocarcinoma  . TONSILLECTOMY       No Known Allergies    Family History  Problem Relation Age of Onset  . Heart disease Mother   . Stroke Mother   . Heart attack Mother   . Cancer Father     lung  . Heart disease Father   . Hypertension Father   . Cancer Brother     rectal  . Peripheral vascular disease Brother   . Healthy Son      Social History [name] reports that she quit smoking about 14 years ago. She started smoking about 53 years ago. She has a 12.00 pack-year smoking history. She has never used smokeless tobacco. [name] reports that she does not drink alcohol.   Review of Systems CONSTITUTIONAL: No weight loss, fever, chills, weakness or fatigue.  HEENT: Eyes: No visual loss, blurred vision, double vision or yellow sclerae.No hearing loss, sneezing, congestion, runny nose or sore throat.  SKIN: No rash or itching.  CARDIOVASCULAR: per hpi RESPIRATORY: No shortness of breath, cough or sputum.  GASTROINTESTINAL: No anorexia, nausea, vomiting or diarrhea. No abdominal pain or blood.  GENITOURINARY: No burning on urination, no polyuria NEUROLOGICAL: No headache, dizziness, syncope, paralysis, ataxia, numbness or tingling in the extremities. No change in bowel or bladder control.  MUSCULOSKELETAL: No muscle, back pain, joint pain or stiffness.  LYMPHATICS: No enlarged nodes. No history of splenectomy.  PSYCHIATRIC: No history of depression or anxiety.  ENDOCRINOLOGIC: No reports of sweating, cold or heat intolerance. No polyuria or polydipsia.  Marland Kitchen   Physical Examination Vitals:   06/30/16 1156  BP: 102/60  Pulse: 67   Vitals:   06/30/16 1156  Weight: 113 lb (51.3 kg)  Height: 5\' 2"  (1.575 m)    Gen: resting comfortably, no acute distress HEENT: no scleral icterus, pupils equal round and reactive, no palptable cervical adenopathy,  CV: RRR,  no m/r/g, no jvd Resp: Clear to auscultation bilaterally GI: abdomen is soft, non-tender, non-distended, normal bowel sounds, no hepatosplenomegaly MSK: extremities are warm, no edema.  Skin: warm, no rash Neuro:  no focal deficits Psych: appropriate affect   Diagnostic Studies 03/2013 MPI  Overall low risk exercise Cardiolite. Equivocal ST segment abnormalities were noted, mild chest pain at peak exertion, otherwise no definite arrhythmias. Perfusion imaging is consistent with breast attenuation affecting the mid to apical anteroseptal wall, no definite ischemic defects. LVEF is 71% with normal volumes and no wall motion abnormalities.     Assessment and Plan  1. Afib  - no recent symptoms. EKG in clinic shows NSR -CHADS2VASC score is 2, continue antioag   2. Atypical chest pain - low risk MPI with no evidence of ischemia -no recent symptoms.  - continue to monitor.   3. Hyperlipidemia - at goal, she will continue statin      Follow up 1 year      Arnoldo Lenis, M.D., F.A.C.C.

## 2016-06-30 NOTE — Patient Instructions (Signed)
Medication Instructions:  Your physician recommends that you continue on your current medications as directed. Please refer to the Current Medication list given to you today.   Labwork: NONE    Testing/Procedures: NONE  Follow-UP: Your physician wants you to follow-up in: 1 YEAR.  You will receive a reminder letter in the mail two months in advance. If you don't receive a letter, please call our office to schedule the follow-up appointment.   Any Other Special Instructions Will Be Listed Below (If Applicable).     If you need a refill on your cardiac medications before your next appointment, please call your pharmacy.

## 2016-07-06 ENCOUNTER — Ambulatory Visit (HOSPITAL_COMMUNITY)
Admission: RE | Admit: 2016-07-06 | Discharge: 2016-07-06 | Disposition: A | Discharge status: Home / Self Care | Payer: Medicare Other | Source: Ambulatory Visit | Attending: Family Medicine | Admitting: Family Medicine

## 2016-07-06 ENCOUNTER — Encounter (HOSPITAL_COMMUNITY): Payer: Self-pay

## 2016-07-06 DIAGNOSIS — Z1231 Encounter for screening mammogram for malignant neoplasm of breast: Secondary | ICD-10-CM | POA: Diagnosis not present

## 2016-07-07 DIAGNOSIS — M542 Cervicalgia: Secondary | ICD-10-CM | POA: Diagnosis not present

## 2016-07-07 DIAGNOSIS — G8929 Other chronic pain: Secondary | ICD-10-CM | POA: Diagnosis not present

## 2016-07-07 DIAGNOSIS — M25511 Pain in right shoulder: Secondary | ICD-10-CM | POA: Diagnosis not present

## 2016-07-07 DIAGNOSIS — R52 Pain, unspecified: Secondary | ICD-10-CM | POA: Diagnosis not present

## 2016-07-13 ENCOUNTER — Telehealth: Payer: Self-pay | Admitting: Family Medicine

## 2016-07-13 NOTE — Telephone Encounter (Signed)
Patient wants to know if she needs to have blood work done before her appointment with Dr. Nicki Reaper on 09/01/16?

## 2016-07-13 NOTE — Telephone Encounter (Signed)
She does not need any lab work at this time thank you we will see her at her appointment

## 2016-07-13 NOTE — Telephone Encounter (Signed)
Spoke with patient and informed her per Dr.Scott Luking- No labs are needed for appointment. We will see you at your appointment. Patient verbalized understanding.

## 2016-07-13 NOTE — Telephone Encounter (Signed)
Left message return call 07/13/2016 

## 2016-07-15 DIAGNOSIS — M542 Cervicalgia: Secondary | ICD-10-CM | POA: Diagnosis not present

## 2016-07-22 ENCOUNTER — Encounter (INDEPENDENT_AMBULATORY_CARE_PROVIDER_SITE_OTHER): Payer: Self-pay | Admitting: Internal Medicine

## 2016-07-22 ENCOUNTER — Encounter (INDEPENDENT_AMBULATORY_CARE_PROVIDER_SITE_OTHER): Payer: Self-pay

## 2016-07-23 DIAGNOSIS — G8929 Other chronic pain: Secondary | ICD-10-CM | POA: Diagnosis not present

## 2016-07-23 DIAGNOSIS — M25511 Pain in right shoulder: Secondary | ICD-10-CM | POA: Diagnosis not present

## 2016-07-23 DIAGNOSIS — M542 Cervicalgia: Secondary | ICD-10-CM | POA: Diagnosis not present

## 2016-08-03 ENCOUNTER — Ambulatory Visit (INDEPENDENT_AMBULATORY_CARE_PROVIDER_SITE_OTHER): Payer: Medicare Other | Admitting: Internal Medicine

## 2016-08-03 ENCOUNTER — Encounter (INDEPENDENT_AMBULATORY_CARE_PROVIDER_SITE_OTHER): Payer: Self-pay | Admitting: Internal Medicine

## 2016-08-03 VITALS — BP 100/58 | HR 60 | Temp 97.0°F | Ht 62.0 in | Wt 114.4 lb

## 2016-08-03 DIAGNOSIS — K219 Gastro-esophageal reflux disease without esophagitis: Secondary | ICD-10-CM | POA: Diagnosis not present

## 2016-08-03 NOTE — Patient Instructions (Addendum)
Continue the Omeprazole and Zantac. Recall for colonoscopy in November.

## 2016-08-03 NOTE — Progress Notes (Signed)
Subjective:    Patient ID: [name], female    DOB: October 20, 1940, 76 y.o.   MRN: [id number]  HPI here today for f/u. She says; she is due for a colonoscopy in November of this year.  She saw Dr Benjamine Mola for hoarseness. She says she saw him for swollen glands. Placed on an antibiotic which cleared. She also saw him for fluid on her ear drums.  She takes Prilosec and Zantac.  She watches what she eats. The Zantac and Prilosec are helping.  She says she burps frequently.  She says her acid reflux is controlled with Zantac and Prilosec.   Her appetite is good. No weight loss.  She has a BM daily. No melena or BRRB. She had a bone density last year and was normal.  Hx of atrial fib and maintained on Eliquis.    10/29/2015 BUn 14. Creatinine 0.77.   03/25/2012:   Colonoscopy  Indications:  Patient is 76 year old Caucasian female with history of colon carcinoma with sigmoid resection 7 years ago. Patient's last exam was 3 years ago. She is undergoing surveillance colonoscopy.  Impression:  Examination performed to cecum. No evidence of recurrent polyps or other mucosal abnormalities. Wide-open colonic anastomosis at 20 cm from anal margin. Next colonoscopy in 5 yrs. (2018),.  03/18/2009 EGD/Colonoscopy. Chronic GERD., surveillance colonoscopy.   Hx of sigmoid resection in [place] 2006 for invasive carcinoma and a polyps.  Normal EGD. Normal Colonoscopy.  anastomosis located at 18cm from the anal margin .  Review of Systems Past Medical History:  Diagnosis Date  . Adenocarcinoma, colon ([place]) 03/2005   Stage I; resected in 03/2005  . Arthritis   . Gastroesophageal reflux disease   . Gastroparesis   . Hearing loss    mild  . Hyperlipidemia   . Irregular heartbeat   . Ovarian cyst 2008  . Paroxysmal atrial fibrillation (HCC)    Palpitations; maintained on flecainide; -normal coronary angiography and 1999; negative stress nuclear study in 11/2005  . PONV (postoperative nausea and  vomiting)   . Tobacco abuse, in remission    10-pack-years; quit in 2001    Past Surgical History:  Procedure Laterality Date  . ABDOMINAL HYSTERECTOMY  1983   partial  . CHOLECYSTECTOMY    . COLON SURGERY  2006   Colon cancer  . COLONOSCOPY  03/25/2012   [name], MD  . COSMETIC SURGERY  09/2007  . PARTIAL COLECTOMY  2006   adenocarcinoma  . TONSILLECTOMY      No Known Allergies  Current Outpatient Prescriptions on File Prior to Visit  Medication Sig Dispense Refill  . Calcium Citrate (CITRACAL PO) Take 1 tablet by mouth every morning.     Marland Kitchen ELIQUIS 5 MG TABS tablet TAKE ONE TABLET TWICE DAILY 60 tablet 6  . flecainide (TAMBOCOR) 100 MG tablet TAKE ONE TABLET EVERY 12 HOURS 180 tablet 3  . gabapentin (NEURONTIN) 100 MG capsule Take 200 mg by mouth 3 (three) times daily.   2  . Lactulose SOLN Take 30 mLs by mouth at bedtime.     . rosuvastatin (CRESTOR) 40 MG tablet TAKE ONE (1) TABLET EACH DAY 30 tablet 5  . Omega-3 Fatty Acids (FISH OIL) 1000 MG CAPS Take 1,000 mg by mouth daily.     No current facility-administered medications on file prior to visit.        Objective:   Physical Exam Blood pressure (!) 100/58, pulse 60, temperature 97 F (36.1 C), height 5'  2" (1.575 m), weight 114 lb 6.4 oz (51.9 kg).  Alert and oriented. Skin warm and dry. Oral mucosa is moist.   . Sclera anicteric, conjunctivae is pink. Thyroid not enlarged. No cervical lymphadenopathy. Lungs clear. Heart regular rate and rhythm.  Abdomen is soft. Bowel sounds are positive. No hepatomegaly. No abdominal masses felt. No tenderness.  No edema to lower extremities.          Assessment & Plan:  GERD controlled with Prilosec and Zantac. Will discuss with Dr Laural Golden if he wants to add an  EGD to her colonoscopy in November. Bone density and kidney functions ar normal.  Will get records from Dr. Benjamine Mola.

## 2016-08-14 DIAGNOSIS — H5203 Hypermetropia, bilateral: Secondary | ICD-10-CM | POA: Diagnosis not present

## 2016-08-14 DIAGNOSIS — H25813 Combined forms of age-related cataract, bilateral: Secondary | ICD-10-CM | POA: Diagnosis not present

## 2016-08-14 DIAGNOSIS — H52221 Regular astigmatism, right eye: Secondary | ICD-10-CM | POA: Diagnosis not present

## 2016-08-14 DIAGNOSIS — H524 Presbyopia: Secondary | ICD-10-CM | POA: Diagnosis not present

## 2016-08-20 ENCOUNTER — Ambulatory Visit (INDEPENDENT_AMBULATORY_CARE_PROVIDER_SITE_OTHER): Payer: Medicare Other | Admitting: Otolaryngology

## 2016-08-20 DIAGNOSIS — M47816 Spondylosis without myelopathy or radiculopathy, lumbar region: Secondary | ICD-10-CM | POA: Diagnosis not present

## 2016-08-20 DIAGNOSIS — H9012 Conductive hearing loss, unilateral, left ear, with unrestricted hearing on the contralateral side: Secondary | ICD-10-CM

## 2016-08-20 DIAGNOSIS — H6522 Chronic serous otitis media, left ear: Secondary | ICD-10-CM

## 2016-08-20 DIAGNOSIS — G629 Polyneuropathy, unspecified: Secondary | ICD-10-CM | POA: Diagnosis not present

## 2016-08-20 DIAGNOSIS — H6982 Other specified disorders of Eustachian tube, left ear: Secondary | ICD-10-CM | POA: Diagnosis not present

## 2016-09-01 ENCOUNTER — Encounter: Payer: Self-pay | Admitting: Family Medicine

## 2016-09-01 ENCOUNTER — Ambulatory Visit (INDEPENDENT_AMBULATORY_CARE_PROVIDER_SITE_OTHER): Payer: Medicare Other | Admitting: Family Medicine

## 2016-09-01 VITALS — BP 118/72 | Ht 62.0 in | Wt 113.2 lb

## 2016-09-01 DIAGNOSIS — E784 Other hyperlipidemia: Secondary | ICD-10-CM

## 2016-09-01 DIAGNOSIS — E7849 Other hyperlipidemia: Secondary | ICD-10-CM

## 2016-09-01 MED ORDER — ROSUVASTATIN CALCIUM 40 MG PO TABS: ORAL_TABLET | ORAL | 6 refills | Status: DC

## 2016-09-01 NOTE — Progress Notes (Signed)
   Subjective:    Patient ID: [name], female    DOB: August 22, 1940, 76 y.o.   MRN: [id number]  Hyperlipidemia  This is a chronic problem. The current episode started more than 1 year ago. Pertinent negatives include no chest pain or shortness of breath. Treatments tried: crestor. There are no compliance problems.  Risk factors for coronary artery disease include dyslipidemia and post-menopausal.   Patient in multiple questions regarding eustachian tube dysfunction we discussed this at length   Review of Systems  Constitutional: Negative for activity change, fatigue and fever.  Respiratory: Negative for cough and shortness of breath.   Cardiovascular: Negative for chest pain and leg swelling.  Neurological: Negative for headaches.       Objective:   Physical Exam  Constitutional: She appears well-nourished. No distress.  Cardiovascular: Normal rate, regular rhythm and normal heart sounds.   No murmur heard. Pulmonary/Chest: Effort normal and breath sounds normal. No respiratory distress.  Musculoskeletal: She exhibits no edema.  Lymphadenopathy:    She has no cervical adenopathy.  Neurological: She is alert. She exhibits normal muscle tone.  Psychiatric: Her behavior is normal.  Vitals reviewed.    Dr Virgie Dad him for shoulder and knees  Dr Roy-sees him for sciatica     Assessment & Plan:  Hyperlipidemia previous labs reviewed current labs stable no need for further testing currently follow-up in the fall continue Crestor for cholesterol. Follow-up when necessary  Eustachian tube dysfunction getting tubes placed by ENT

## 2016-09-03 ENCOUNTER — Encounter (INDEPENDENT_AMBULATORY_CARE_PROVIDER_SITE_OTHER): Payer: Self-pay

## 2016-09-04 DIAGNOSIS — H6982 Other specified disorders of Eustachian tube, left ear: Secondary | ICD-10-CM | POA: Diagnosis not present

## 2016-09-04 DIAGNOSIS — H6522 Chronic serous otitis media, left ear: Secondary | ICD-10-CM | POA: Diagnosis not present

## 2016-09-26 ENCOUNTER — Ambulatory Visit (INDEPENDENT_AMBULATORY_CARE_PROVIDER_SITE_OTHER): Payer: Medicare Other | Admitting: Otolaryngology

## 2016-09-28 ENCOUNTER — Ambulatory Visit (INDEPENDENT_AMBULATORY_CARE_PROVIDER_SITE_OTHER): Payer: Medicare Other | Admitting: Otolaryngology

## 2016-09-28 DIAGNOSIS — H903 Sensorineural hearing loss, bilateral: Secondary | ICD-10-CM | POA: Diagnosis not present

## 2016-09-28 DIAGNOSIS — H7202 Central perforation of tympanic membrane, left ear: Secondary | ICD-10-CM

## 2016-09-28 DIAGNOSIS — H6983 Other specified disorders of Eustachian tube, bilateral: Secondary | ICD-10-CM

## 2016-09-28 DIAGNOSIS — R42 Dizziness and giddiness: Secondary | ICD-10-CM

## 2016-10-22 ENCOUNTER — Other Ambulatory Visit: Payer: Self-pay | Admitting: Cardiology

## 2016-12-17 ENCOUNTER — Other Ambulatory Visit: Payer: Self-pay | Admitting: *Deleted

## 2016-12-17 MED ORDER — ROSUVASTATIN CALCIUM 40 MG PO TABS: ORAL_TABLET | ORAL | 0 refills | Status: DC

## 2017-01-05 DIAGNOSIS — M47816 Spondylosis without myelopathy or radiculopathy, lumbar region: Secondary | ICD-10-CM | POA: Diagnosis not present

## 2017-01-06 ENCOUNTER — Ambulatory Visit (INDEPENDENT_AMBULATORY_CARE_PROVIDER_SITE_OTHER): Payer: Medicare Other | Admitting: Internal Medicine

## 2017-01-13 ENCOUNTER — Other Ambulatory Visit: Payer: Self-pay | Admitting: Cardiology

## 2017-01-21 ENCOUNTER — Telehealth (INDEPENDENT_AMBULATORY_CARE_PROVIDER_SITE_OTHER): Payer: Self-pay | Admitting: *Deleted

## 2017-01-21 ENCOUNTER — Encounter (INDEPENDENT_AMBULATORY_CARE_PROVIDER_SITE_OTHER): Payer: Self-pay

## 2017-01-21 ENCOUNTER — Other Ambulatory Visit (INDEPENDENT_AMBULATORY_CARE_PROVIDER_SITE_OTHER): Payer: Self-pay | Admitting: Internal Medicine

## 2017-01-21 ENCOUNTER — Encounter (INDEPENDENT_AMBULATORY_CARE_PROVIDER_SITE_OTHER): Payer: Self-pay | Admitting: Internal Medicine

## 2017-01-21 ENCOUNTER — Ambulatory Visit (INDEPENDENT_AMBULATORY_CARE_PROVIDER_SITE_OTHER): Payer: Medicare Other | Admitting: Internal Medicine

## 2017-01-21 ENCOUNTER — Encounter (INDEPENDENT_AMBULATORY_CARE_PROVIDER_SITE_OTHER): Payer: Self-pay | Admitting: *Deleted

## 2017-01-21 VITALS — BP 100/60 | HR 60 | Temp 97.5°F | Ht 62.0 in | Wt 112.7 lb

## 2017-01-21 DIAGNOSIS — C189 Malignant neoplasm of colon, unspecified: Secondary | ICD-10-CM | POA: Insufficient documentation

## 2017-01-21 MED ORDER — PEG 3350-KCL-NA BICARB-NACL 420 G PO SOLR: 4000.0000 mL | Freq: Once | ORAL | 0 refills | Status: AC

## 2017-01-21 NOTE — Telephone Encounter (Signed)
Patient needs trilyte 

## 2017-01-21 NOTE — Progress Notes (Signed)
Subjective:    Patient ID: [name], female    DOB: [birth year], 76 y.o.   MRN: [id number]  HPI Here today to discuss colonoscopy/possible EGD. Her last colonoscopy was in 2013.  No evidence of recurrent polyps or other mucosal abnormalities. She states last year she was having a lot of horseness and she saw Dr. Benjamine Mola. She says her hoarseness cleared with ? Antibiotic. She is presently taking Prilosec and Zantac. She rarely has acid reflux.  Her appetite is good. No weight loss.  She usualy has a BM daily.   Hx of atrial fib and maintained on Eliquis.    03/25/2012: Colonoscopy  Indications:Patient is 76 year old Caucasian female with history of colon carcinoma with sigmoid resection 7 years ago. Patient's last exam was 3 years ago. She is undergoing surveillance colonoscopy.  Impression:  Examination performed to cecum. No evidence of recurrent polyps or other mucosal abnormalities. Wide-open colonic anastomosis at 20 cm from anal margin. Next colonoscopy in 5 yrs. (2018),.    03/18/2009 EGD/Colonoscopy. Chronic GERD., surveillance colonoscopy.   Hx of sigmoid resection in [place] 2006 for invasive carcinoma and a polyps.  Normal EGD. Normal Colonoscopy.  anastomosis located at 18cm from the anal margin .   Review of Systems Past Medical History:  Diagnosis Date  . Adenocarcinoma, colon ([place]) 03/2005   Stage I; resected in 03/2005  . Arthritis   . Gastroesophageal reflux disease   . Gastroparesis   . Hearing loss    mild  . Hyperlipidemia   . Irregular heartbeat   . Ovarian cyst 2008  . Paroxysmal atrial fibrillation ([facility])    Palpitations; maintained on flecainide; -normal coronary angiography and 1999; negative stress nuclear study in 11/2005  . PONV (postoperative nausea and vomiting)   . Tobacco abuse, in remission    10-pack-years; quit in 2001    Past Surgical History:  Procedure Laterality Date  . ABDOMINAL HYSTERECTOMY  1983   partial  .  CHOLECYSTECTOMY    . COLON SURGERY  2006   Colon cancer  . COLONOSCOPY  03/25/2012   [name], MD  . COSMETIC SURGERY  09/2007  . PARTIAL COLECTOMY  2006   adenocarcinoma  . TONSILLECTOMY      No Known Allergies  Current Outpatient Prescriptions on File Prior to Visit  Medication Sig Dispense Refill  . Calcium Citrate (CITRACAL PO) Take 1 tablet by mouth every morning.     Marland Kitchen ELIQUIS 5 MG TABS tablet TAKE ONE TABLET TWICE DAILY 60 tablet 3  . flecainide (TAMBOCOR) 100 MG tablet TAKE ONE TABLET EVERY 12 HOURS 180 tablet 3  . gabapentin (NEURONTIN) 100 MG capsule Take 200 mg by mouth 3 (three) times daily.   2  . HYDROcodone-acetaminophen (NORCO) 10-325 MG tablet Take 1 tablet by mouth every 6 (six) hours as needed.    . Lactulose SOLN Take 30 mLs by mouth at bedtime.     Marland Kitchen MEGARED OMEGA-3 KRILL OIL 500 MG CAPS Take by mouth.    . Omega-3 Fatty Acids (FISH OIL) 1000 MG CAPS Take 1,000 mg by mouth daily.    Marland Kitchen omeprazole (PRILOSEC) 20 MG capsule Take 20 mg by mouth daily.    . ranitidine (ZANTAC) 150 MG tablet Take 150 mg by mouth at bedtime.    . rosuvastatin (CRESTOR) 40 MG tablet TAKE ONE (1) TABLET EACH DAY 90 tablet 0   No current facility-administered medications on file prior to visit.  Objective:   Physical Exam Blood pressure 100/60, pulse 60, temperature (!) 97.5 F (36.4 C), height 5\' 2"  (1.575 m), weight 112 lb 11.2 oz (51.1 kg).  Physical exam deferred. Patient is here to discuss colonoscopy/EGD       Assessment & Plan:  Hx of colon cancer. Due for a surveillance colonoscopy. GERD: Continue the Prilosec and Zantac.

## 2017-01-21 NOTE — Patient Instructions (Signed)
Colonosvopy The risks of bleeding, perforation and infection were reviewed with patient.

## 2017-01-26 ENCOUNTER — Ambulatory Visit (INDEPENDENT_AMBULATORY_CARE_PROVIDER_SITE_OTHER): Payer: Medicare Other | Admitting: Family Medicine

## 2017-01-26 ENCOUNTER — Telehealth: Payer: Self-pay | Admitting: Family Medicine

## 2017-01-26 ENCOUNTER — Encounter: Payer: Self-pay | Admitting: Family Medicine

## 2017-01-26 VITALS — BP 110/68 | Temp 98.0°F | Ht 62.0 in | Wt 111.0 lb

## 2017-01-26 DIAGNOSIS — E785 Hyperlipidemia, unspecified: Secondary | ICD-10-CM

## 2017-01-26 DIAGNOSIS — N390 Urinary tract infection, site not specified: Secondary | ICD-10-CM | POA: Diagnosis not present

## 2017-01-26 DIAGNOSIS — Z79899 Other long term (current) drug therapy: Secondary | ICD-10-CM

## 2017-01-26 DIAGNOSIS — R319 Hematuria, unspecified: Secondary | ICD-10-CM | POA: Diagnosis not present

## 2017-01-26 DIAGNOSIS — D539 Nutritional anemia, unspecified: Secondary | ICD-10-CM

## 2017-01-26 LAB — POCT URINALYSIS DIPSTICK: PH UA: 5 (ref 5.0–8.0)

## 2017-01-26 MED ORDER — CEFPROZIL 250 MG PO TABS: 250.0000 mg | ORAL_TABLET | Freq: Two times a day (BID) | ORAL | 0 refills | Status: DC

## 2017-01-26 NOTE — Telephone Encounter (Signed)
Lipid, liver, metabolic 7, CBC, [id number], macrocytic anemia

## 2017-01-26 NOTE — Telephone Encounter (Signed)
Requesting orders for labs for appointment on 03/03/17 with Dr. Nicki Reaper.

## 2017-01-26 NOTE — Telephone Encounter (Signed)
Left message to return call 

## 2017-01-26 NOTE — Progress Notes (Signed)
   Subjective:    Patient ID: [name], female    DOB: [birth year], 76 y.o.   MRN: [id number]  Urinary Tract Infection   This is a new problem. Associated symptoms include frequency and hematuria. Pertinent negatives include no chills.  Patient states she is burning and stinging while urinating. This started last night. She states she wiped and saw blood.Has not taken anything for this. Dysuria urinary frequency lower abdominal burning pain denies high fever chills sweats  Review of Systems  Constitutional: Negative for chills, fatigue and fever.  HENT: Negative for congestion.   Respiratory: Negative for cough and shortness of breath.   Gastrointestinal: Negative for abdominal pain, constipation and diarrhea.  Genitourinary: Positive for difficulty urinating, dysuria, frequency and hematuria.   Results for orders placed or performed in visit on 01/26/17  POCT urinalysis dipstick  Result Value Ref Range   Color, UA     Clarity, UA     Glucose, UA     Bilirubin, UA     Ketones, UA     Spec Grav, UA >=1.030 (A) 1.010 - 1.025   Blood, UA 2+    pH, UA 5.0 5.0 - 8.0   Protein, UA trace    Urobilinogen, UA  0.2 or 1.0 E.U./dL   Nitrite, UA     Leukocytes, UA Large (3+) (A) Negative       Objective:   Physical Exam  Constitutional: She appears well-developed and well-nourished. No distress.  HENT:  Head: Normocephalic and atraumatic.  Eyes: Right eye exhibits no discharge. Left eye exhibits no discharge.  Neck: No tracheal deviation present.  Cardiovascular: Normal rate, regular rhythm and normal heart sounds.   No murmur heard. Pulmonary/Chest: Effort normal and breath sounds normal. No respiratory distress. She has no wheezes. She has no rales.  Musculoskeletal: She exhibits no edema.  Lymphadenopathy:    She has no cervical adenopathy.  Neurological: She is alert. She exhibits normal muscle tone.  Skin: Skin is warm and dry. No erythema.  Psychiatric: Her behavior is  normal.  Vitals reviewed.         Assessment & Plan:  UTI Culture sent Antibiotics prescribed OTC Azo-Standard when necessary Warning signs discussed Follow-up if progressive troubles or worse

## 2017-01-26 NOTE — Telephone Encounter (Signed)
Last labs jan 2018. Lipid, liver, bmp, cbc, sed rate, ck, vit b 12, c reactive protein

## 2017-01-27 NOTE — Telephone Encounter (Signed)
Spoke with patient and informed her that Dr.Scott received her message and labs have been ordered for her upcoming appointment. Patient verbalized understanding.

## 2017-01-28 LAB — URINE CULTURE

## 2017-01-31 ENCOUNTER — Encounter: Payer: Self-pay | Admitting: Family Medicine

## 2017-02-23 DIAGNOSIS — M25511 Pain in right shoulder: Secondary | ICD-10-CM | POA: Diagnosis not present

## 2017-02-23 DIAGNOSIS — G8929 Other chronic pain: Secondary | ICD-10-CM | POA: Diagnosis not present

## 2017-02-24 DIAGNOSIS — E785 Hyperlipidemia, unspecified: Secondary | ICD-10-CM | POA: Diagnosis not present

## 2017-02-24 DIAGNOSIS — D539 Nutritional anemia, unspecified: Secondary | ICD-10-CM | POA: Diagnosis not present

## 2017-02-24 DIAGNOSIS — Z79899 Other long term (current) drug therapy: Secondary | ICD-10-CM | POA: Diagnosis not present

## 2017-02-25 LAB — CBC WITH DIFFERENTIAL/PLATELET
BASOS ABS: 0 10*3/uL (ref 0.0–0.2)
Basos: 0 %
EOS (ABSOLUTE): 0 10*3/uL (ref 0.0–0.4)
Eos: 0 %
HEMOGLOBIN: 11.7 g/dL (ref 11.1–15.9)
Hematocrit: 36.2 % (ref 34.0–46.6)
IMMATURE GRANS (ABS): 0 10*3/uL (ref 0.0–0.1)
IMMATURE GRANULOCYTES: 0 %
LYMPHS: 20 %
Lymphocytes Absolute: 1.4 10*3/uL (ref 0.7–3.1)
MCH: 32.2 pg (ref 26.6–33.0)
MCHC: 32.3 g/dL (ref 31.5–35.7)
MCV: 100 fL — ABNORMAL HIGH (ref 79–97)
MONOCYTES: 5 %
Monocytes Absolute: 0.3 10*3/uL (ref 0.1–0.9)
Neutrophils Absolute: 5.3 10*3/uL (ref 1.4–7.0)
Neutrophils: 75 %
Platelets: 286 10*3/uL (ref 150–379)
RBC: 3.63 x10E6/uL — AB (ref 3.77–5.28)
RDW: 12.8 % (ref 12.3–15.4)
WBC: 7.1 10*3/uL (ref 3.4–10.8)

## 2017-02-25 LAB — BASIC METABOLIC PANEL
BUN/Creatinine Ratio: 23 (ref 12–28)
BUN: 17 mg/dL (ref 8–27)
CALCIUM: 9.7 mg/dL (ref 8.7–10.3)
CHLORIDE: 106 mmol/L (ref 96–106)
CO2: 22 mmol/L (ref 20–29)
Creatinine, Ser: 0.74 mg/dL (ref 0.57–1.00)
GFR calc Af Amer: 92 mL/min/{1.73_m2} (ref >59)
GFR calc non Af Amer: 80 mL/min/{1.73_m2} (ref >59)
GLUCOSE: 142 mg/dL — AB (ref 65–99)
Potassium: 4.1 mmol/L (ref 3.5–5.2)
Sodium: 142 mmol/L (ref 134–144)

## 2017-02-25 LAB — HEPATIC FUNCTION PANEL
ALT: 12 IU/L (ref 0–32)
AST: 16 IU/L (ref 0–40)
Albumin: 4.5 g/dL (ref 3.5–4.8)
Alkaline Phosphatase: 87 IU/L (ref 39–117)
BILIRUBIN TOTAL: 0.2 mg/dL (ref 0.0–1.2)
Bilirubin, Direct: 0.07 mg/dL (ref 0.00–0.40)
TOTAL PROTEIN: 6.9 g/dL (ref 6.0–8.5)

## 2017-02-25 LAB — LIPID PANEL
Chol/HDL Ratio: 2.6 ratio (ref 0.0–4.4)
Cholesterol, Total: 173 mg/dL (ref 100–199)
HDL: 66 mg/dL (ref >39)
LDL CALC: 86 mg/dL (ref 0–99)
TRIGLYCERIDES: 105 mg/dL (ref 0–149)
VLDL Cholesterol Cal: 21 mg/dL (ref 5–40)

## 2017-02-25 LAB — VITAMIN B12: VITAMIN B 12: 419 pg/mL (ref 232–1245)

## 2017-03-01 ENCOUNTER — Encounter: Payer: Self-pay | Admitting: Family Medicine

## 2017-03-01 ENCOUNTER — Ambulatory Visit: Payer: Medicare Other | Admitting: Family Medicine

## 2017-03-01 ENCOUNTER — Ambulatory Visit (INDEPENDENT_AMBULATORY_CARE_PROVIDER_SITE_OTHER): Payer: Medicare Other | Admitting: Family Medicine

## 2017-03-01 VITALS — BP 124/66 | Ht 62.0 in | Wt 111.2 lb

## 2017-03-01 DIAGNOSIS — R7303 Prediabetes: Secondary | ICD-10-CM | POA: Diagnosis not present

## 2017-03-01 DIAGNOSIS — R7301 Impaired fasting glucose: Secondary | ICD-10-CM | POA: Diagnosis not present

## 2017-03-01 DIAGNOSIS — Z23 Encounter for immunization: Secondary | ICD-10-CM

## 2017-03-01 DIAGNOSIS — E7849 Other hyperlipidemia: Secondary | ICD-10-CM | POA: Diagnosis not present

## 2017-03-01 LAB — POCT GLYCOSYLATED HEMOGLOBIN (HGB A1C): Hemoglobin A1C: 5.4

## 2017-03-01 NOTE — Progress Notes (Signed)
   Subjective:    Patient ID: [name], female    DOB: 07-Mar-1941, 76 y.o.   MRN: [id number]  Hyperlipidemia  This is a chronic problem. Pertinent negatives include no chest pain. There are no compliance problems.    Patient states no other concerns this visit.  The patient was seen today as part of an evaluation regarding hyperlipidemia. Recent lab work has been reviewed with the patient as well as the goals for good cholesterol care. In addition to this medications have been discussed the importance of compliance with diet and medications discussed as well. Patient has been informed of potential side effects of medications in the importance to notify us should any problems occur. Finally the patient is aware that poor control of cholesterol, noncompliance can dramatically increase her risk of heart attack strokes and premature death. The patient will keep regular office visits and the patient does agreed to periodic lab work.    Review of Systems  Constitutional: Negative for activity change, appetite change and fatigue.  HENT: Negative for congestion.   Respiratory: Negative for cough.   Cardiovascular: Negative for chest pain.  Gastrointestinal: Negative for abdominal pain.  Endocrine: Negative for polydipsia and polyphagia.  Skin: Negative for color change.  Neurological: Negative for weakness.  Psychiatric/Behavioral: Negative for confusion.       Objective:   Physical Exam  Constitutional: She appears well-developed and well-nourished. No distress.  HENT:  Head: Normocephalic and atraumatic.  Eyes: Right eye exhibits no discharge. Left eye exhibits no discharge.  Neck: No tracheal deviation present.  Cardiovascular: Normal rate, regular rhythm and normal heart sounds.   No murmur heard. Pulmonary/Chest: Effort normal and breath sounds normal. No respiratory distress. She has no wheezes. She has no rales.  Musculoskeletal: She exhibits no edema.  Lymphadenopathy:   She has no cervical adenopathy.  Neurological: She is alert. She exhibits normal muscle tone.  Skin: Skin is warm and dry. No erythema.  Psychiatric: Her behavior is normal.  Vitals reviewed.   Patient states rarely she gets abdominal pain with nausea feels like she needs to throw up she is not having any problems or pain or vomiting currently she was encouraged to monitor her situation if she starts having these spells happen more she needs a follow-up2 examine and test appropriately      Assessment & Plan:  Hyperlipidemia recent lab work looks good continue current medications  Patient on blood thinners CBC shows no anemia  Fasting hyperglycemia/prediabetes-A1c acceptable dietary recommendations made  Flu vaccine  Shingles vaccine recommended prescription given

## 2017-03-03 ENCOUNTER — Ambulatory Visit: Payer: Medicare Other | Admitting: Family Medicine

## 2017-03-19 ENCOUNTER — Telehealth (INDEPENDENT_AMBULATORY_CARE_PROVIDER_SITE_OTHER): Payer: Self-pay | Admitting: *Deleted

## 2017-03-19 NOTE — Telephone Encounter (Signed)
Patient is scheduled for colonoscopy 04/22/17 and needs to stop Eliquis 2 days prior -- please advise

## 2017-03-19 NOTE — Telephone Encounter (Signed)
OK to hold Eliquis 48 hours before procedure.  Resume Eliquis after procedure per Dr Laural Golden

## 2017-03-22 NOTE — Telephone Encounter (Signed)
Patient aware.

## 2017-03-24 DIAGNOSIS — M47816 Spondylosis without myelopathy or radiculopathy, lumbar region: Secondary | ICD-10-CM | POA: Diagnosis not present

## 2017-04-16 ENCOUNTER — Telehealth: Payer: Self-pay | Admitting: Family Medicine

## 2017-04-16 DIAGNOSIS — B49 Unspecified mycosis: Secondary | ICD-10-CM

## 2017-04-16 NOTE — Telephone Encounter (Signed)
Requesting referral to podiatrist here in [place].  One of her big toenails are hurting her really bad.

## 2017-04-16 NOTE — Telephone Encounter (Signed)
Pt is aware.  

## 2017-04-16 NOTE — Telephone Encounter (Signed)
Podiatry referral

## 2017-04-16 NOTE — Telephone Encounter (Signed)
I spoke with the pt husband and he state the pt has a fungus on the right great toe. Would like a podiatry referral.

## 2017-04-21 ENCOUNTER — Encounter (INDEPENDENT_AMBULATORY_CARE_PROVIDER_SITE_OTHER): Payer: Self-pay | Admitting: *Deleted

## 2017-04-21 ENCOUNTER — Other Ambulatory Visit: Payer: Self-pay | Admitting: Family Medicine

## 2017-04-26 ENCOUNTER — Encounter (HOSPITAL_COMMUNITY)
Admission: RE | Disposition: A | Discharge status: Home / Self Care | Payer: Self-pay | Source: Ambulatory Visit | Attending: Internal Medicine

## 2017-04-26 ENCOUNTER — Ambulatory Visit (HOSPITAL_COMMUNITY)
Admission: RE | Admit: 2017-04-26 | Discharge: 2017-04-26 | Disposition: A | Discharge status: Home / Self Care | Payer: Medicare Other | Source: Ambulatory Visit | Attending: Internal Medicine | Admitting: Internal Medicine

## 2017-04-26 ENCOUNTER — Encounter (HOSPITAL_COMMUNITY): Payer: Self-pay | Admitting: *Deleted

## 2017-04-26 ENCOUNTER — Other Ambulatory Visit: Payer: Self-pay

## 2017-04-26 DIAGNOSIS — K219 Gastro-esophageal reflux disease without esophagitis: Secondary | ICD-10-CM | POA: Diagnosis not present

## 2017-04-26 DIAGNOSIS — D120 Benign neoplasm of cecum: Secondary | ICD-10-CM | POA: Diagnosis not present

## 2017-04-26 DIAGNOSIS — E785 Hyperlipidemia, unspecified: Secondary | ICD-10-CM | POA: Insufficient documentation

## 2017-04-26 DIAGNOSIS — Z1211 Encounter for screening for malignant neoplasm of colon: Secondary | ICD-10-CM | POA: Insufficient documentation

## 2017-04-26 DIAGNOSIS — K3184 Gastroparesis: Secondary | ICD-10-CM | POA: Insufficient documentation

## 2017-04-26 DIAGNOSIS — Z87891 Personal history of nicotine dependence: Secondary | ICD-10-CM | POA: Insufficient documentation

## 2017-04-26 DIAGNOSIS — Z79899 Other long term (current) drug therapy: Secondary | ICD-10-CM | POA: Diagnosis not present

## 2017-04-26 DIAGNOSIS — I480 Paroxysmal atrial fibrillation: Secondary | ICD-10-CM | POA: Diagnosis not present

## 2017-04-26 DIAGNOSIS — Z980 Intestinal bypass and anastomosis status: Secondary | ICD-10-CM | POA: Diagnosis not present

## 2017-04-26 DIAGNOSIS — D124 Benign neoplasm of descending colon: Secondary | ICD-10-CM | POA: Diagnosis not present

## 2017-04-26 DIAGNOSIS — Z85038 Personal history of other malignant neoplasm of large intestine: Secondary | ICD-10-CM | POA: Diagnosis not present

## 2017-04-26 DIAGNOSIS — C189 Malignant neoplasm of colon, unspecified: Secondary | ICD-10-CM | POA: Insufficient documentation

## 2017-04-26 DIAGNOSIS — Z86010 Personal history of colonic polyps: Secondary | ICD-10-CM | POA: Diagnosis not present

## 2017-04-26 DIAGNOSIS — Z09 Encounter for follow-up examination after completed treatment for conditions other than malignant neoplasm: Secondary | ICD-10-CM | POA: Diagnosis not present

## 2017-04-26 HISTORY — PX: COLONOSCOPY: SHX5424

## 2017-04-26 HISTORY — PX: POLYPECTOMY: SHX5525

## 2017-04-26 SURGERY — COLONOSCOPY
Anesthesia: Moderate Sedation

## 2017-04-26 MED ORDER — MIDAZOLAM HCL 5 MG/5ML IJ SOLN
INTRAMUSCULAR | Status: DC | PRN
  Administered 2017-04-26 (×2): 1 mg via INTRAVENOUS
  Administered 2017-04-26: 2 mg via INTRAVENOUS

## 2017-04-26 MED ORDER — MEPERIDINE HCL 50 MG/ML IJ SOLN
INTRAMUSCULAR | Status: DC | PRN
  Administered 2017-04-26 (×2): 25 mg via INTRAVENOUS

## 2017-04-26 MED ORDER — MEPERIDINE HCL 50 MG/ML IJ SOLN
INTRAMUSCULAR | Status: AC
  Filled 2017-04-26: qty 1

## 2017-04-26 MED ORDER — STERILE WATER FOR IRRIGATION IR SOLN
Status: DC | PRN
  Administered 2017-04-26: 15:00:00

## 2017-04-26 MED ORDER — MIDAZOLAM HCL 5 MG/5ML IJ SOLN
INTRAMUSCULAR | Status: AC
  Filled 2017-04-26: qty 10

## 2017-04-26 MED ORDER — SODIUM CHLORIDE 0.9 % IV SOLN
INTRAVENOUS | Status: DC
  Administered 2017-04-26: 14:00:00 via INTRAVENOUS

## 2017-04-26 NOTE — Op Note (Signed)
[facility] Patient Name: [name] Procedure Date: 04/26/2017 2:16 PM MRN: [id number] Date of Birth: 09/10/1940 Attending MD: Hildred Laser , MD CSN: [id number] Age: 76 Admit Type: Outpatient Procedure:                Colonoscopy Indications:              High risk colon cancer surveillance: Personal                            history of colon cancer Providers:                Hildred Laser, MD, Otis Peak B. Sharon Seller, RN, Nelma Rothman, Technician, Randa Spike, Technician Referring MD:             Elayne Snare. Wolfgang Phoenix, MD Medicines:                Meperidine 50 mg IV, Midazolam 4 mg IV Complications:            No immediate complications. Estimated Blood Loss:     Estimated blood loss was minimal. Procedure:                Pre-Anesthesia Assessment:                           - Prior to the procedure, a History and Physical                            was performed, and patient medications and                            allergies were reviewed. The patient's tolerance of                            previous anesthesia was also reviewed. The risks                            and benefits of the procedure and the sedation                            options and risks were discussed with the patient.                            All questions were answered, and informed consent                            was obtained. Prior Anticoagulants: The patient                            last took Eliquis (apixaban) 3 days prior to the                            procedure. ASA Grade Assessment: II - A patient  with mild systemic disease. After reviewing the                            risks and benefits, the patient was deemed in                            satisfactory condition to undergo the procedure.                           After obtaining informed consent, the colonoscope                            was passed under direct vision. Throughout the                             procedure, the patient's blood pressure, pulse, and                            oxygen saturations were monitored continuously. The                            [id number] ([id number]) was introduced through the                            anus and advanced to the the cecum, identified by                            appendiceal orifice and ileocecal valve. The                            colonoscopy was somewhat difficult due to a                            tortuous colon. The patient tolerated the procedure                            well. The quality of the bowel preparation was                            good. The ileocecal valve, appendiceal orifice, and                            rectum were photographed. Scope In: 2:41:50 PM Scope Out: 3:16:02 PM Scope Withdrawal Time: 0 hours 19 minutes 21 seconds  Total Procedure Duration: 0 hours 34 minutes 12 seconds  Findings:      The perianal and digital rectal examinations were normal.      A 20 to 25 mm polyp was found in the cecum. The polyp was flat and       multi-lobulated. The polyp was removed with a saline injection-lift       technique using a hot snare. Polyp resection was incomplete. The       resected tissue was retrieved. Coagulation for destruction of remaining       portion of lesion using argon plasma was  successful. The pathology       specimen was placed into Bottle Number 2.      Two sessile polyps were found in the descending colon. The polyps were       small in size. These polyps were removed with a cold snare. Resection       and retrieval were complete. The pathology specimen was placed into       Bottle Number 1.      A small polyp was found in the descending colon. The polyp was sessile.       Biopsies were taken with a cold forceps for histology. The pathology       specimen was placed into Bottle Number 1.      There was evidence of a prior end-to-end colo-colonic anastomosis in the        recto-sigmoid colon. This was patent.      The retroflexed view of the distal rectum and anal verge was normal and       showed no anal or rectal abnormalities. Impression:               - One 20 to 25 mm polyp in the cecum, removed using                            injection-lift and a hot snare. Incomplete                            resection. Resected tissue retrieved. Treated with                            argon plasma coagulation (APC).                           - Two small polyps in the descending colon, removed                            with a cold snare. Resected and retrieved.                           - One small polyp in the descending colon. Biopsied.                           - Patent end-to-end colo-colonic anastomosis. Moderate Sedation:      Moderate (conscious) sedation was administered by the endoscopy nurse       and supervised by the endoscopist. The following parameters were       monitored: oxygen saturation, heart rate, blood pressure, CO2       capnography and response to care. Total physician intraservice time was       36 minutes. Recommendation:           - Patient has a contact number available for                            emergencies. The signs and symptoms of potential                            delayed complications were discussed with the  patient. Return to normal activities tomorrow.                            Written discharge instructions were provided to the                            patient.                           - Resume previous diet today.                           - Continue present medications.                           - Resume Eliquis (apixaban) at prior dose in 5 days.                           - Await pathology results.                           - Repeat colonoscopy in 1 year for surveillance. Procedure Code(s):        --- Professional ---                           [phone], Colonoscopy, flexible; with removal  of                            tumor(s), polyp(s), or other lesion(s) by snare                            technique                           45381, Colonoscopy, flexible; with directed                            submucosal injection(s), any substance                           62952, 59, Colonoscopy, flexible; with biopsy,                            single or multiple                           99152, Moderate sedation services provided by the                            same physician or other qualified health care                            professional performing the diagnostic or                            therapeutic service that the sedation supports,  requiring the presence of an independent trained                            observer to assist in the monitoring of the                            patient's level of consciousness and physiological                            status; initial 15 minutes of intraservice time,                            patient age 25 years or older                           [phone], Moderate sedation services; each additional                            15 minutes intraservice time Diagnosis Code(s):        --- Professional ---                           [phone], Personal history of other malignant                            neoplasm of large intestine                           D12.0, Benign neoplasm of cecum                           D12.4, Benign neoplasm of descending colon                           Z98.0, Intestinal bypass and anastomosis status CPT copyright 2016 American Medical Association. All rights reserved. The codes documented in this report are preliminary and upon coder review may  be revised to meet current compliance requirements. Hildred Laser, MD Hildred Laser, MD 04/26/2017 3:35:09 PM This report has been signed electronically. Number of Addenda: 0

## 2017-04-26 NOTE — Discharge Instructions (Signed)
Resume Eliquis on 05/01/2017. Resume other medications and diet as before. No driving for 24 hours. Physician will call with biopsy results.      Colonoscopy, Adult, Care After This sheet gives you information about how to care for yourself after your procedure. Your doctor may also give you more specific instructions. If you have problems or questions, call your doctor. Follow these instructions at home: General instructions   For the first 24 hours after the procedure: ? Do not drive or use machinery. ? Do not sign important documents. ? Do not drink alcohol. ? Do your daily activities more slowly than normal. ? Eat foods that are soft and easy to digest. ? Rest often.  Take over-the-counter or prescription medicines only as told by your doctor.  It is up to you to get the results of your procedure. Ask your doctor, or the department performing the procedure, when your results will be ready. To help cramping and bloating:  Try walking around.  Put heat on your belly (abdomen) as told by your doctor. Use a heat source that your doctor recommends, such as a moist heat pack or a heating pad. ? Put a towel between your skin and the heat source. ? Leave the heat on for 20-30 minutes. ? Remove the heat if your skin turns bright red. This is especially important if you cannot feel pain, heat, or cold. You can get burned. Eating and drinking  Drink enough fluid to keep your pee (urine) clear or pale yellow.  Return to your normal diet as told by your doctor. Avoid heavy or fried foods that are hard to digest.  Avoid drinking alcohol for as long as told by your doctor. Contact a doctor if:  You have blood in your poop (stool) 2-3 days after the procedure. Get help right away if:  You have more than a small amount of blood in your poop.  You see large clumps of tissue (blood clots) in your poop.  Your belly is swollen.  You feel sick to your stomach (nauseous).  You  throw up (vomit).  You have a fever.  You have belly pain that gets worse, and medicine does not help your pain. This information is not intended to replace advice given to you by your health care provider. Make sure you discuss any questions you have with your health care provider. Document Released: 05/30/2010 Document Revised: 01/20/2016 Document Reviewed: 01/20/2016 Elsevier Interactive Patient Education  2017 [place].     Colon Polyps Polyps are tissue growths inside the body. Polyps can grow in many places, including the large intestine (colon). A polyp may be a round bump or a mushroom-shaped growth. You could have one polyp or several. Most colon polyps are noncancerous (benign). However, some colon polyps can become cancerous over time. What are the causes? The exact cause of colon polyps is not known. What increases the risk? This condition is more likely to develop in people who:  Have a family history of colon cancer or colon polyps.  Are older than 39 or older than 45 if they are African American.  Have inflammatory bowel disease, such as ulcerative colitis or Crohn disease.  Are overweight.  Smoke cigarettes.  Do not get enough exercise.  Drink too much alcohol.  Eat a diet that is: ? High in fat and red meat. ? Low in fiber.  Had childhood cancer that was treated with abdominal radiation.  What are the signs or symptoms? Most polyps do  not cause symptoms. If you have symptoms, they may include:  Blood coming from your rectum when having a bowel movement.  Blood in your stool.The stool may look dark red or black.  A change in bowel habits, such as constipation or diarrhea.  How is this diagnosed? This condition is diagnosed with a colonoscopy. This is a procedure that uses a lighted, flexible scope to look at the inside of your colon. How is this treated? Treatment for this condition involves removing any polyps that are found. Those polyps  will then be tested for cancer. If cancer is found, your health care provider will talk to you about options for colon cancer treatment. Follow these instructions at home: Diet  Eat plenty of fiber, such as fruits, vegetables, and whole grains.  Eat foods that are high in calcium and vitamin D, such as milk, cheese, yogurt, eggs, liver, fish, and broccoli.  Limit foods high in fat, red meats, and processed meats, such as hot dogs, sausage, bacon, and lunch meats.  Maintain a healthy weight, or lose weight if recommended by your health care provider. General instructions  Do not smoke cigarettes.  Do not drink alcohol excessively.  Keep all follow-up visits as told by your health care provider. This is important. This includes keeping regularly scheduled colonoscopies. Talk to your health care provider about when you need a colonoscopy.  Exercise every day or as told by your health care provider. Contact a health care provider if:  You have new or worsening bleeding during a bowel movement.  You have new or increased blood in your stool.  You have a change in bowel habits.  You unexpectedly lose weight. This information is not intended to replace advice given to you by your health care provider. Make sure you discuss any questions you have with your health care provider. Document Released: 01/22/2004 Document Revised: 10/03/2015 Document Reviewed: 03/18/2015 Elsevier Interactive Patient Education  [employer].

## 2017-04-26 NOTE — H&P (Signed)
[name] is an 76 y.o. female.   Chief Complaint: Patient is here for colonoscopy. HPI: 76 year old Caucasian female with history of sigmoid colon carcinoma and is here for surveillance colonoscopy.  Her surgery was about 5 years ago.  Last colonoscopy was unremarkable in November 2013.  She denies rectal bleeding diarrhea or constipation.  She complains of back pain.  She says she is to back surgeries and she has constant pain. Patient has been off Eliquis for 3 days.  Past Medical History:  Diagnosis Date  . 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 Arthritis   . Gastroesophageal reflux disease   . Gastroparesis   . Hearing loss    mild  . Hyperlipidemia   . Irregular heartbeat   . Ovarian cyst 2008  . Paroxysmal atrial fibrillation ([facility])    Palpitations; maintained on flecainide; -normal coronary angiography and 1999; negative stress nuclear study in 11/2005  . PONV (postoperative nausea and vomiting)   . Tobacco abuse, in remission    10-pack-years; quit in 2001    Past Surgical History:  Procedure Laterality Date  . ABDOMINAL HYSTERECTOMY  1983   partial  . CHOLECYSTECTOMY    . COLON SURGERY  2006   Colon cancer  . COLONOSCOPY  03/25/2012   [name], MD  . COSMETIC SURGERY  09/2007  . PARTIAL COLECTOMY  2006   adenocarcinoma  . TONSILLECTOMY      Family History  Problem Relation Age of Onset  . Heart disease Mother   . Stroke Mother   . Heart attack Mother   . Cancer Father        lung  . Heart disease Father   . Hypertension Father   . Cancer Brother        rectal  . Peripheral vascular disease Brother   . Healthy Son    Social History:  reports that she quit smoking about 15 years ago. She started smoking about 54 years ago. She has a 12.00 pack-year smoking history. she has never used smokeless tobacco. She reports that she does not drink alcohol or use drugs.  Allergies: No Known Allergies  Medications Prior to  Admission  Medication Sig Dispense Refill  . Calcium Carb-Cholecalciferol (CALCIUM 1000 + D PO) Take 1 tablet by mouth daily.    Marland Kitchen ELIQUIS 5 MG TABS tablet TAKE ONE TABLET TWICE DAILY 60 tablet 3  . flecainide (TAMBOCOR) 100 MG tablet TAKE ONE TABLET EVERY 12 HOURS 180 tablet 3  . gabapentin (NEURONTIN) 100 MG capsule Take 200 mg by mouth 3 (three) times daily.   2  . HYDROcodone-acetaminophen (NORCO) 10-325 MG tablet Take 1 tablet by mouth every 6 (six) hours as needed (FOR PAIN.).     Marland Kitchen Lactulose SOLN Take 30 mLs by mouth at bedtime.     Marland Kitchen MEGARED OMEGA-3 KRILL OIL 500 MG CAPS Take 500 mg by mouth daily.     . NONFORMULARY OR COMPOUNDED ITEM Apply 1-2 g topically 3 (three) times daily as needed for pain. Dicl/Gab/Lidocaine 1%-5%-5% Cr (NERVE DAMAGE PAIN IN FOOT)  99  . omeprazole (PRILOSEC) 20 MG capsule Take 20 mg by mouth daily before breakfast.     . ranitidine (ZANTAC) 150 MG tablet Take 150 mg by mouth at bedtime.    . rosuvastatin (CRESTOR) 40 MG tablet TAKE ONE (1) TABLET EACH DAY 90 tablet 1    No results found for this or any previous visit (from the  past 48 hour(s)). No results found.  ROS  Blood pressure (!) 145/54, pulse 60, temperature 97.6 F (36.4 C), temperature source Oral, resp. rate 11, height 5\' 2"  (1.575 m), weight 110 lb (49.9 kg), SpO2 100 %. Physical Exam  Constitutional: She appears well-developed and well-nourished.  HENT:  Mouth/Throat: Oropharynx is clear and moist.  Eyes: Conjunctivae are normal. No scleral icterus.  Neck: No thyromegaly present.  Cardiovascular: Normal rate, regular rhythm and normal heart sounds.  No murmur heard. Respiratory: Effort normal and breath sounds normal.  GI:  Abdomen slightly prominent in left lower quadrant.  Right subcostal and lower midline scars noted.  Abdomen is soft and nontender.  No organomegaly or masses.  Musculoskeletal: She exhibits no edema.  Lymphadenopathy:    She has no cervical adenopathy.   Neurological: She is alert.  Skin: Skin is warm and dry.     Assessment/Plan History of colon carcinoma. Surveillance colonoscopy.  Hildred Laser, MD 04/26/2017, 2:33 PM

## 2017-04-28 DIAGNOSIS — M79674 Pain in right toe(s): Secondary | ICD-10-CM | POA: Diagnosis not present

## 2017-04-28 DIAGNOSIS — B351 Tinea unguium: Secondary | ICD-10-CM | POA: Diagnosis not present

## 2017-04-30 ENCOUNTER — Encounter (HOSPITAL_COMMUNITY): Payer: Self-pay | Admitting: Internal Medicine

## 2017-05-13 ENCOUNTER — Other Ambulatory Visit: Payer: Self-pay | Admitting: Cardiology

## 2017-05-27 ENCOUNTER — Other Ambulatory Visit: Payer: Self-pay | Admitting: Family Medicine

## 2017-05-27 DIAGNOSIS — Z1231 Encounter for screening mammogram for malignant neoplasm of breast: Secondary | ICD-10-CM

## 2017-06-29 DIAGNOSIS — M25511 Pain in right shoulder: Secondary | ICD-10-CM | POA: Diagnosis not present

## 2017-06-29 HISTORY — DX: Pain in right shoulder: M25.511

## 2017-06-30 ENCOUNTER — Ambulatory Visit (INDEPENDENT_AMBULATORY_CARE_PROVIDER_SITE_OTHER): Payer: Medicare Other | Admitting: Cardiology

## 2017-06-30 ENCOUNTER — Encounter: Payer: Self-pay | Admitting: Cardiology

## 2017-06-30 VITALS — BP 112/62 | HR 70 | Ht 62.0 in | Wt 109.0 lb

## 2017-06-30 DIAGNOSIS — R0789 Other chest pain: Secondary | ICD-10-CM | POA: Diagnosis not present

## 2017-06-30 DIAGNOSIS — I4891 Unspecified atrial fibrillation: Secondary | ICD-10-CM

## 2017-06-30 DIAGNOSIS — E782 Mixed hyperlipidemia: Secondary | ICD-10-CM | POA: Diagnosis not present

## 2017-06-30 NOTE — Progress Notes (Signed)
Clinical Summary [name] is a 77 y.o.female seen today for follow up of the following medical problems.   1. Afib  - has done well on flecanide for severla years - no recent palpitations - compliant with meds - no bleeding troubles on eliquis.   - no recent palpitations - compliant with meds. No bleeding issues on eliquis.   2. Atypical chest pain - Previous stress test was low risk.  - no recent symptoms.   3. Hyperlipidemia - Oct 2018: TC 173 TG 105 HDL 66 LDL 86 - compliant with statin   SH: husband with 3 surgeries recently. She has been helping at home.  Sister passed in November.   Past Medical History:  Diagnosis Date  . 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 Arthritis   . Gastroesophageal reflux disease   . Gastroparesis   . Hearing loss    mild  . Hyperlipidemia   . Irregular heartbeat   . Ovarian cyst 2008  . Paroxysmal atrial fibrillation (HCC)    Palpitations; maintained on flecainide; -normal coronary angiography and 1999; negative stress nuclear study in 11/2005  . PONV (postoperative nausea and vomiting)   . Tobacco abuse, in remission    10-pack-years; quit in 2001     No Known Allergies   Current Outpatient Medications  Medication Sig Dispense Refill  . apixaban (ELIQUIS) 5 MG TABS tablet Take 1 tablet (5 mg total) by mouth 2 (two) times daily. 60 tablet 3  . Calcium Carb-Cholecalciferol (CALCIUM 1000 + D PO) Take 1 tablet by mouth daily.    Marland Kitchen ELIQUIS 5 MG TABS tablet TAKE ONE TABLET BY MOUTH TWICE DAILY. 60 tablet 3  . flecainide (TAMBOCOR) 100 MG tablet TAKE ONE TABLET EVERY 12 HOURS 180 tablet 3  . gabapentin (NEURONTIN) 100 MG capsule Take 200 mg by mouth 3 (three) times daily.   2  . HYDROcodone-acetaminophen (NORCO) 10-325 MG tablet Take 1 tablet by mouth every 6 (six) hours as needed (FOR PAIN.).     Marland Kitchen Lactulose SOLN Take 30 mLs by mouth at bedtime.     Marland Kitchen MEGARED OMEGA-3 KRILL OIL 500 MG  CAPS Take 500 mg by mouth daily.     . NONFORMULARY OR COMPOUNDED ITEM Apply 1-2 g topically 3 (three) times daily as needed for pain. Dicl/Gab/Lidocaine 1%-5%-5% Cr (NERVE DAMAGE PAIN IN FOOT)  99  . omeprazole (PRILOSEC) 20 MG capsule Take 20 mg by mouth daily before breakfast.     . ranitidine (ZANTAC) 150 MG tablet Take 150 mg by mouth at bedtime.    . rosuvastatin (CRESTOR) 40 MG tablet TAKE ONE (1) TABLET EACH DAY 90 tablet 1   No current facility-administered medications for this visit.      Past Surgical History:  Procedure Laterality Date  . ABDOMINAL HYSTERECTOMY  1983   partial  . CHOLECYSTECTOMY    . COLON SURGERY  2006   Colon cancer  . COLONOSCOPY  03/25/2012   Lisa Houston, MD  . COLONOSCOPY N/A 04/26/2017   Procedure: COLONOSCOPY;  Surgeon: Lisa Houston, MD;  [facility];  Service: Endoscopy;  Laterality: N/A;  2:00  . COSMETIC SURGERY  09/2007  . PARTIAL COLECTOMY  2006   adenocarcinoma  . POLYPECTOMY  04/26/2017   Procedure: POLYPECTOMY;  Surgeon: Lisa Houston, MD;  Location: [facility];  Service: Endoscopy;;  colon  . TONSILLECTOMY       No Known  Allergies    Family History  Problem Relation Age of Onset  . Heart disease Mother   . Stroke Mother   . Heart attack Mother   . Cancer Father        lung  . Heart disease Father   . Hypertension Father   . Cancer Brother        rectal  . Peripheral vascular disease Brother   . Healthy Son      Social History Ms. Glodowski reports that she quit smoking about 15 years ago. She started smoking about 54 years ago. She has a 12.00 pack-year smoking history. she has never used smokeless tobacco. Ms. Fellner reports that she does not drink alcohol.   Review of Systems CONSTITUTIONAL: No weight loss, fever, chills, weakness or fatigue.  HEENT: Eyes: No visual loss, blurred vision, double vision or yellow sclerae.No hearing loss, sneezing, congestion, runny nose or sore throat.  SKIN: No  rash or itching.  CARDIOVASCULAR: per hpi RESPIRATORY: No shortness of breath, cough or sputum.  GASTROINTESTINAL: No anorexia, nausea, vomiting or diarrhea. No abdominal pain or blood.  GENITOURINARY: No burning on urination, no polyuria NEUROLOGICAL: No headache, dizziness, syncope, paralysis, ataxia, numbness or tingling in the extremities. No change in bowel or bladder control.  MUSCULOSKELETAL: No muscle, back pain, joint pain or stiffness.  LYMPHATICS: No enlarged nodes. No history of splenectomy.  PSYCHIATRIC: No history of depression or anxiety.  ENDOCRINOLOGIC: No reports of sweating, cold or heat intolerance. No polyuria or polydipsia.  Marland Kitchen   Physical Examination Vitals:   06/30/17 1108  BP: 112/62  Pulse: 70  SpO2: 97%   Vitals:   06/30/17 1108  Weight: 109 lb (49.4 kg)  Height: 5\' 2"  (1.575 m)    Gen: resting comfortably, no acute distress HEENT: no scleral icterus, pupils equal round and reactive, no palptable cervical adenopathy,  CV: RRR, no m/r/,g no jvd Resp: Clear to auscultation bilaterally GI: abdomen is soft, non-tender, non-distended, normal bowel sounds, no hepatosplenomegaly MSK: extremities are warm, no edema.  Skin: warm, no rash Neuro:  no focal deficits Psych: appropriate affect   Diagnostic Studies 03/2013 MPI Overall low risk exercise Cardiolite. Equivocal ST segment abnormalities were noted, mild chest pain at peak exertion, otherwise no definite arrhythmias. Perfusion imaging is consistent with breast attenuation affecting the mid to apical anteroseptal wall, no definite ischemic defects. LVEF is 71% with normal volumes and no wall motion abnormalities.    Assessment and Plan  1. Afib  -CHADS2VASC score is 2, continue antioag - continue current meds, no recent symptoms.   2. Atypical chest pain - low risk MPI with no evidence of ischemia - no recent symptoms, continue to monitor.   3. Hyperlipidemia - lipids at goal,  continue statin   F/u 1 year   Arnoldo Lenis, M.D.

## 2017-06-30 NOTE — Patient Instructions (Signed)

## 2017-07-02 ENCOUNTER — Encounter: Payer: Self-pay | Admitting: Cardiology

## 2017-07-05 ENCOUNTER — Ambulatory Visit (INDEPENDENT_AMBULATORY_CARE_PROVIDER_SITE_OTHER): Payer: Medicare Other | Admitting: Otolaryngology

## 2017-07-05 DIAGNOSIS — H9203 Otalgia, bilateral: Secondary | ICD-10-CM | POA: Diagnosis not present

## 2017-07-05 DIAGNOSIS — H7203 Central perforation of tympanic membrane, bilateral: Secondary | ICD-10-CM | POA: Diagnosis not present

## 2017-07-06 DIAGNOSIS — M47816 Spondylosis without myelopathy or radiculopathy, lumbar region: Secondary | ICD-10-CM | POA: Diagnosis not present

## 2017-07-07 ENCOUNTER — Ambulatory Visit (HOSPITAL_COMMUNITY)
Admission: RE | Admit: 2017-07-07 | Discharge: 2017-07-07 | Disposition: A | Discharge status: Home / Self Care | Payer: Medicare Other | Source: Ambulatory Visit | Attending: Family Medicine | Admitting: Family Medicine

## 2017-07-07 DIAGNOSIS — Z1231 Encounter for screening mammogram for malignant neoplasm of breast: Secondary | ICD-10-CM | POA: Insufficient documentation

## 2017-07-26 ENCOUNTER — Ambulatory Visit: Payer: Medicare Other | Admitting: Family Medicine

## 2017-08-25 ENCOUNTER — Ambulatory Visit (INDEPENDENT_AMBULATORY_CARE_PROVIDER_SITE_OTHER): Payer: Medicare Other | Admitting: Student

## 2017-08-25 ENCOUNTER — Encounter: Payer: Self-pay | Admitting: Student

## 2017-08-25 ENCOUNTER — Telehealth: Payer: Self-pay | Admitting: *Deleted

## 2017-08-25 ENCOUNTER — Encounter: Payer: Self-pay | Admitting: *Deleted

## 2017-08-25 ENCOUNTER — Other Ambulatory Visit (HOSPITAL_COMMUNITY)
Admission: RE | Admit: 2017-08-25 | Discharge: 2017-08-25 | Disposition: A | Discharge status: Home / Self Care | Payer: Medicare Other | Source: Ambulatory Visit | Attending: Student | Admitting: Student

## 2017-08-25 VITALS — BP 96/60 | HR 70 | Ht 62.0 in | Wt 108.6 lb

## 2017-08-25 DIAGNOSIS — R0789 Other chest pain: Secondary | ICD-10-CM | POA: Diagnosis not present

## 2017-08-25 DIAGNOSIS — R079 Chest pain, unspecified: Secondary | ICD-10-CM | POA: Diagnosis not present

## 2017-08-25 DIAGNOSIS — R5383 Other fatigue: Secondary | ICD-10-CM | POA: Diagnosis not present

## 2017-08-25 DIAGNOSIS — R072 Precordial pain: Secondary | ICD-10-CM

## 2017-08-25 DIAGNOSIS — E782 Mixed hyperlipidemia: Secondary | ICD-10-CM | POA: Diagnosis not present

## 2017-08-25 DIAGNOSIS — I480 Paroxysmal atrial fibrillation: Secondary | ICD-10-CM

## 2017-08-25 LAB — TROPONIN I: Troponin I: <0.03 ng/mL (ref <0.03)

## 2017-08-25 NOTE — Progress Notes (Signed)
Cardiology Office Note    Date:  08/25/2017   ID:  [name], DOB [birth year], MRN [id number]  PCP:  Kathyrn Drown, MD  Cardiologist: Carlyle Dolly, MD    Chief Complaint  Patient presents with  . Chest Pain    History of Present Illness:    [name] is a 77 y.o. female with past medical history of PAF (on Flecainide and Eliquis), atypical chest pain, and HLD who presents to the office today for evaluation of worsening chest pain.  She was last examined by Dr. Harl Bowie in 06/2017 and denied any recent chest pain or dyspnea on exertion at that time. Was continued on her current medication regimen including Eliquis for anticoagulation.   In talking with the patient today, she reports episodes of chest pain occurring for the past 3 days. Describes this as a pressure along her left pectoral region which has been intermittent and lasting for several hours at a time and then spontaneously resolving. Pain sometimes presents with deep breathing. She has not noticed any association with exertion or positional changes. Does improve somewhat when lying flat. Reports breathing has been baseline but she has noticed worsening fatigue ever since undergoing back surgery last year. No recent travel. Has chronic leg pain but denies any calf pain.   She denies any recent orthopnea, PND, or lower extremity edema. Denies any recent palpitations. She reports good compliance with her medication regimen including Eliquis and Flecainide. Denies any evidence of active bleeding.   Past Medical History:  Diagnosis Date  . Adenocarcinoma, colon ([place]) 03/2005   Stage I; resected in 03/2005  . Arthritis   . Gastroesophageal reflux disease   . Gastroparesis   . Hearing loss    mild  . Hyperlipidemia   . Irregular heartbeat   . Ovarian cyst 2008  . Paroxysmal atrial fibrillation (HCC)    Palpitations; maintained on flecainide; -normal coronary angiography and 1999; negative stress nuclear study in  11/2005  . PONV (postoperative nausea and vomiting)   . Tobacco abuse, in remission    10-pack-years; quit in 2001    Past Surgical History:  Procedure Laterality Date  . ABDOMINAL HYSTERECTOMY  1983   partial  . CHOLECYSTECTOMY    . COLON SURGERY  2006   Colon cancer  . COLONOSCOPY  03/25/2012   [name], MD  . COLONOSCOPY N/A 04/26/2017   Procedure: COLONOSCOPY;  Surgeon: [name], MD;  [facility];  Service: Endoscopy;  Laterality: N/A;  2:00  . COSMETIC SURGERY  09/2007  . PARTIAL COLECTOMY  2006   adenocarcinoma  . POLYPECTOMY  04/26/2017   Procedure: POLYPECTOMY;  Surgeon: [name], MD;  Location: [facility];  Service: Endoscopy;;  colon  . TONSILLECTOMY      Current Medications: Outpatient Medications Prior to Visit  Medication Sig Dispense Refill  . apixaban (ELIQUIS) 5 MG TABS tablet Take 1 tablet (5 mg total) by mouth 2 (two) times daily. 60 tablet 3  . Calcium Carb-Cholecalciferol (CALCIUM 1000 + D PO) Take 1 tablet by mouth daily.    . flecainide (TAMBOCOR) 100 MG tablet TAKE ONE TABLET EVERY 12 HOURS 180 tablet 3  . gabapentin (NEURONTIN) 100 MG capsule Take 200 mg by mouth 3 (three) times daily.   2  . HYDROcodone-acetaminophen (NORCO) 10-325 MG tablet Take 1 tablet by mouth every 6 (six) hours as needed (FOR PAIN.).     Marland Kitchen Lactulose SOLN Take 30 mLs by  mouth at bedtime.     Marland Kitchen MEGARED OMEGA-3 KRILL OIL 500 MG CAPS Take 500 mg by mouth daily.     . NONFORMULARY OR COMPOUNDED ITEM Apply 1-2 g topically 3 (three) times daily as needed for pain. Dicl/Gab/Lidocaine 1%-5%-5% Cr (NERVE DAMAGE PAIN IN FOOT)  99  . omeprazole (PRILOSEC) 20 MG capsule Take 20 mg by mouth daily before breakfast.     . ranitidine (ZANTAC) 150 MG tablet Take 150 mg by mouth at bedtime.    . rosuvastatin (CRESTOR) 40 MG tablet TAKE ONE (1) TABLET EACH DAY 90 tablet 1   No facility-administered medications prior to visit.      Allergies:   Patient has no  known allergies.   Social History   Socioeconomic History  . Marital status: Married    Spouse name: Not on file  . Number of children: Not on file  . Years of education: Not on file  . Highest education level: Not on file  Occupational History  . Occupation: Medical laboratory scientific officer  . Financial resource strain: Not on file  . Food insecurity:    Worry: Not on file    Inability: Not on file  . Transportation needs:    Medical: Not on file    Non-medical: Not on file  Tobacco Use  . Smoking status: Former Smoker    Packs/day: 0.30    Years: 40.00    Pack years: 12.00    Start date: 02/23/1963    Last attempt to quit: 03/25/2002    Years since quitting: 15.4  . Smokeless tobacco: Never Used  . Tobacco comment: Quit smoking in 2001  Substance and Sexual Activity  . Alcohol use: No    Alcohol/week: 0.0 oz  . Drug use: No  . Sexual activity: Yes    Birth control/protection: Surgical  Lifestyle  . Physical activity:    Days per week: Not on file    Minutes per session: Not on file  . Stress: Not on file  Relationships  . Social connections:    Talks on phone: Not on file    Gets together: Not on file    Attends religious service: Not on file    Active member of club or organization: Not on file    Attends meetings of clubs or organizations: Not on file    Relationship status: Not on file  Other Topics Concern  . Not on file  Social History Narrative   Lives with husband in a one story home.  Has 3 children.  Retired from Medical sales representative work.  Education: some college.     Family History:  The patient's family history includes Cancer in her brother and father; Healthy in her son; Heart attack in her mother; Heart disease in her father and mother; Hypertension in her father; Peripheral vascular disease in her brother; Stroke in her mother.   Review of Systems:   Please see the history of present illness.     General:  No chills, fever, night sweats or weight changes.    Cardiovascular:  No dyspnea on exertion, edema, orthopnea, palpitations, paroxysmal nocturnal dyspnea. Positive for chest pain.  Dermatological: No rash, lesions/masses Respiratory: No cough, dyspnea Urologic: No hematuria, dysuria Abdominal:   No nausea, vomiting, diarrhea, bright red blood per rectum, melena, or hematemesis Neurologic:  No visual changes, wkns, changes in mental status. All other systems reviewed and are otherwise negative except as noted above.   Physical Exam:    VS:  BP  96/60   Pulse 70   Ht 5\' 2"  (1.575 m)   Wt 108 lb 9.6 oz (49.3 kg)   SpO2 97%   BMI 19.86 kg/m    General: Well developed, well nourished Caucasian female appearing in no acute distress. Head: Normocephalic, atraumatic, sclera non-icteric, no xanthomas, nares are without discharge.  Neck: No carotid bruits. JVD not elevated.  Lungs: Respirations regular and unlabored, without wheezes or rales.  Heart: Regular rate and rhythm. No S3 or S4.  No murmur, no rubs, or gallops appreciated. Abdomen: Soft, non-tender, non-distended with normoactive bowel sounds. No hepatomegaly. No rebound/guarding. No obvious abdominal masses. Msk:  Strength and tone appear normal for age. No joint deformities or effusions. Extremities: No clubbing or cyanosis. No lower extremity edema.  Distal pedal pulses are 2+ bilaterally. Neuro: Alert and oriented X 3. Moves all extremities spontaneously. No focal deficits noted. Psych:  Responds to questions appropriately with a normal affect. Skin: No rashes or lesions noted  Wt Readings from Last 3 Encounters:  08/25/17 108 lb 9.6 oz (49.3 kg)  06/30/17 109 lb (49.4 kg)  04/26/17 110 lb (49.9 kg)      Studies/Labs Reviewed:   EKG:  EKG is ordered today.  The ekg ordered today demonstrates NSR, HR 63, with known RBBB and no acute ST or T-wave changes.   Recent Labs: 02/24/2017: ALT 12; BUN 17; Creatinine, Ser 0.74; Hemoglobin 11.7; Platelets 286; Potassium 4.1; Sodium  142   Lipid Panel    Component Value Date/Time   CHOL 173 02/24/2017 0819   TRIG 105 02/24/2017 0819   HDL 66 02/24/2017 0819   CHOLHDL 2.6 02/24/2017 0819   CHOLHDL 3.0 03/19/2014 0712   VLDL 36 03/19/2014 0712   LDLCALC 86 02/24/2017 0819    Additional studies/ records that were reviewed today include:   NST: 03/2013 IMPRESSION: Overall low risk exercise Cardiolite. Equivocal ST segment abnormalities were noted, mild chest pain at peak exertion, otherwise no definite arrhythmias. Perfusion imaging is consistent with breast attenuation affecting the mid to apical anteroseptal wall, no definite ischemic defects. LVEF is 71% with normal volumes and no wall motion abnormalities.   Assessment:    1. Atypical chest pain   2. Other fatigue   3. Paroxysmal atrial fibrillation (HCC)   4. Mixed hyperlipidemia      Plan:   In order of problems listed above:  1. Atypical Chest Pain/ Fatigue - reports having episodes of chest pain occurring for the past 3 days and lasting for several hours at a time and then spontaneously resolving. Pain sometimes presents with deep breathing and is not associated with exertion or positional changes. No associated dyspnea on exertion, orthopnea, PND, or lower extremity edema. - I reviewed with the patient that her pain overall seems atypical for an ischemic etiology but she is very concerned and anxious that she might be having an acute MI as she has a significant family history of CAD.  I recommended that we check a stat troponin value and if this is negative in the setting of her 3-day history of pain, then this is likely non-ischemic in etiology. If Troponin is positive, would need to proceed to the ED for further evaluation. If negative, would anticipate a Treadmill Myoview in the coming weeks for further ischemic evaluation as her last NST was in 2014.  ADDENDUM: Troponin resulted back as negative. Will plan for a Treadmill Myoview. Personally  called the patient and reviewed the plan of care.   2.  Paroxysmal Atrial Fibrillation - She denies any recent palpitations and is maintaining normal sinus rhythm during examination today. Continue Flecainide 100mg  BID.  - Denies any evidence of active bleeding. Remains on Eliquis for anticoagulation.   3. HLD - FLP from 02/2017 showed total cholesterol of 173, HDL 66, and LDL 86. - remains on Crestor 40mg  daily.    Medication Adjustments/Labs and Tests Ordered: Current medicines are reviewed at length with the patient today.  Concerns regarding medicines are outlined above.  Medication changes, Labs and Tests ordered today are listed in the Patient Instructions below. Patient Instructions  Medication Instructions:  Your physician recommends that you continue on your current medications as directed. Please refer to the Current Medication list given to you today.  Labwork: Your physician recommends that you return for lab work today.   Testing/Procedures: NONE   Follow-Up: Your physician recommends that you schedule a follow-up appointment in: 2 Months with Dr. Harl Bowie.   Any Other Special Instructions Will Be Listed Below (If Applicable).  If you need a refill on your cardiac medications before your next appointment, please call your pharmacy.  Thank you for choosing [facility]!    Signed, Erma Heritage, PA-C  08/25/2017 4:23 PM    [facility] [address] [place], [place] [address] Phone: [phone]

## 2017-08-25 NOTE — Telephone Encounter (Signed)
-----   Message from Erma Heritage, [place] sent at 08/25/2017  4:07 PM EDT ----- Personally called the patient and let her know the Troponin value was negative which rules out an acute event in the setting of her chest pain being present for the past 3 days. Will plan for a Treadmill Myoview for further ischemic evaluation. Please contact the patient to schedule her procedure. Thank you.

## 2017-08-25 NOTE — Patient Instructions (Signed)
Medication Instructions:  Your physician recommends that you continue on your current medications as directed. Please refer to the Current Medication list given to you today.   Labwork: Your physician recommends that you return for lab work today.    Testing/Procedures: NONE   Follow-Up: Your physician recommends that you schedule a follow-up appointment in: 2 Months with Dr. Harl Bowie.    Any Other Special Instructions Will Be Listed Below (If Applicable).     If you need a refill on your cardiac medications before your next appointment, please call your pharmacy.  Thank you for choosing [facility]!

## 2017-08-26 ENCOUNTER — Ambulatory Visit: Payer: Medicare Other | Admitting: Family Medicine

## 2017-08-27 ENCOUNTER — Ambulatory Visit: Payer: Medicare Other | Admitting: Family Medicine

## 2017-08-31 ENCOUNTER — Encounter (HOSPITAL_COMMUNITY): Payer: Self-pay

## 2017-08-31 ENCOUNTER — Encounter (HOSPITAL_COMMUNITY)
Admission: RE | Admit: 2017-08-31 | Discharge: 2017-08-31 | Disposition: A | Discharge status: Home / Self Care | Payer: Medicare Other | Source: Ambulatory Visit | Attending: Student | Admitting: Student

## 2017-08-31 ENCOUNTER — Encounter (HOSPITAL_BASED_OUTPATIENT_CLINIC_OR_DEPARTMENT_OTHER)
Admission: RE | Admit: 2017-08-31 | Discharge: 2017-08-31 | Disposition: A | Discharge status: Home / Self Care | Payer: Medicare Other | Source: Ambulatory Visit | Attending: Student | Admitting: Student

## 2017-08-31 ENCOUNTER — Ambulatory Visit: Payer: Medicare Other | Admitting: Family Medicine

## 2017-08-31 DIAGNOSIS — R072 Precordial pain: Secondary | ICD-10-CM

## 2017-08-31 LAB — NM MYOCAR MULTI W/SPECT W/WALL MOTION / EF
CHL CUP NUCLEAR SRS: 0
CHL CUP NUCLEAR SSS: 1
CHL CUP RESTING HR STRESS: 54 {beats}/min
LHR: 0.3
LV dias vol: 61 mL (ref 46–106)
LV sys vol: 22 mL
NUC STRESS TID: 0.95
Peak HR: 92 {beats}/min
SDS: 1

## 2017-08-31 MED ORDER — REGADENOSON 0.4 MG/5ML IV SOLN
INTRAVENOUS | Status: AC
  Administered 2017-08-31: 0.4 mg via INTRAVENOUS
  Filled 2017-08-31: qty 5

## 2017-08-31 MED ORDER — SODIUM CHLORIDE 0.9% FLUSH
INTRAVENOUS | Status: AC
  Administered 2017-08-31: 10 mL via INTRAVENOUS
  Filled 2017-08-31: qty 10

## 2017-08-31 MED ORDER — TECHNETIUM TC 99M TETROFOSMIN IV KIT
10.0000 | PACK | Freq: Once | INTRAVENOUS | Status: AC | PRN
  Administered 2017-08-31: 10.7 via INTRAVENOUS

## 2017-08-31 MED ORDER — TECHNETIUM TC 99M TETROFOSMIN IV KIT
30.0000 | PACK | Freq: Once | INTRAVENOUS | Status: AC | PRN
  Administered 2017-08-31: 31 via INTRAVENOUS

## 2017-09-03 ENCOUNTER — Ambulatory Visit (INDEPENDENT_AMBULATORY_CARE_PROVIDER_SITE_OTHER): Payer: Medicare Other | Admitting: Family Medicine

## 2017-09-03 ENCOUNTER — Encounter: Payer: Self-pay | Admitting: Family Medicine

## 2017-09-03 VITALS — BP 106/64 | Ht 64.0 in | Wt 108.8 lb

## 2017-09-03 DIAGNOSIS — R251 Tremor, unspecified: Secondary | ICD-10-CM

## 2017-09-03 DIAGNOSIS — E538 Deficiency of other specified B group vitamins: Secondary | ICD-10-CM | POA: Diagnosis not present

## 2017-09-03 DIAGNOSIS — R5383 Other fatigue: Secondary | ICD-10-CM | POA: Diagnosis not present

## 2017-09-03 DIAGNOSIS — R6881 Early satiety: Secondary | ICD-10-CM

## 2017-09-03 DIAGNOSIS — Z85038 Personal history of other malignant neoplasm of large intestine: Secondary | ICD-10-CM

## 2017-09-03 DIAGNOSIS — R634 Abnormal weight loss: Secondary | ICD-10-CM

## 2017-09-03 DIAGNOSIS — D510 Vitamin B12 deficiency anemia due to intrinsic factor deficiency: Secondary | ICD-10-CM

## 2017-09-03 NOTE — Progress Notes (Signed)
   Subjective:    Patient ID: [name], female    DOB: 1940/11/11, 77 y.o.   MRN: [id number]  Hyperlipidemia  This is a chronic problem. The current episode started more than 1 year ago. Pertinent negatives include no chest pain. Treatments tried: lipitor. There are no compliance problems.  Risk factors for coronary artery disease include dyslipidemia.  She states recently she has noted over the past few weeks that she eats very little and she gets full she also relates a feeling of nausea she denies any severe reflux issues she denies any severe back pain she only eats twice a day small amounts both times she states she finds multiple different times throughout the day she gets lightheaded and tremor She finds herself feeling shaky denies any unilateral numbness weakness lightheaded She has lost a little bit of weight but she purposely ate more to gain her weight back she denies rectal bleeding she is up-to-date on colonoscopy she did have a fully resected colon cancer back in 2006 she is fearful of reoccurrence of colon cancer  Review of Systems  Constitutional: Negative for activity change, appetite change and fatigue.  HENT: Negative for congestion.   Respiratory: Negative for cough.   Cardiovascular: Negative for chest pain.  Gastrointestinal: Negative for abdominal pain, blood in stool, constipation and nausea.  Endocrine: Negative for polydipsia and polyphagia.  Skin: Negative for color change.  Neurological: Positive for dizziness, tremors and weakness. Negative for headaches.  Psychiatric/Behavioral: Negative for confusion.       Objective:   Physical Exam  Constitutional: She appears well-nourished. No distress.  HENT:  Head: Normocephalic and atraumatic.  Neck: No tracheal deviation present. No thyromegaly present.  Cardiovascular: Normal rate, regular rhythm and normal heart sounds. Exam reveals no friction rub.  No murmur heard. Pulmonary/Chest: Effort normal and  breath sounds normal. No respiratory distress.  Abdominal: Soft. She exhibits no mass. There is no tenderness. There is no rebound.  Musculoskeletal: She exhibits no edema, tenderness or deformity.  Lymphadenopathy:    She has no cervical adenopathy.  Neurological: She is alert. No sensory deficit. She exhibits normal muscle tone.  Skin: Skin is warm and dry. No rash noted.  Psychiatric: Her behavior is normal.  Vitals reviewed.   Previous records were reviewed including lab work previous records regarding her colon cancer We need conferring with her gastroenterologist for EGD because of early satiety do lab work first     [place]:  Patient has history of colon cancer in 2006 She has lost a little bit of weight She is concerned about the return of cancer  Significant fatigue early satiety as well as tremors throughout the day She does not eat enough I encouraged her to have more frequent small meals  We will do lab work it is possible the patient may need to have a CT scan also we will discuss with her gastroenterologist Dr.Rehman  Await results of lab work.

## 2017-09-04 LAB — CBC WITH DIFFERENTIAL/PLATELET
Basophils Absolute: 0 10*3/uL (ref 0.0–0.2)
Basos: 0 %
EOS (ABSOLUTE): 0 10*3/uL (ref 0.0–0.4)
Eos: 0 %
Hematocrit: 37.4 % (ref 34.0–46.6)
Hemoglobin: 12.3 g/dL (ref 11.1–15.9)
Immature Grans (Abs): 0 10*3/uL (ref 0.0–0.1)
Immature Granulocytes: 0 %
Lymphocytes Absolute: 2.7 10*3/uL (ref 0.7–3.1)
Lymphs: 35 %
MCH: 33.5 pg — ABNORMAL HIGH (ref 26.6–33.0)
MCHC: 32.9 g/dL (ref 31.5–35.7)
MCV: 102 fL — ABNORMAL HIGH (ref 79–97)
Monocytes Absolute: 0.6 10*3/uL (ref 0.1–0.9)
Monocytes: 7 %
Neutrophils Absolute: 4.5 10*3/uL (ref 1.4–7.0)
Neutrophils: 58 %
Platelets: 265 10*3/uL (ref 150–379)
RBC: 3.67 x10E6/uL — ABNORMAL LOW (ref 3.77–5.28)
RDW: 13.3 % (ref 12.3–15.4)
WBC: 7.8 10*3/uL (ref 3.4–10.8)

## 2017-09-04 LAB — BASIC METABOLIC PANEL
BUN/Creatinine Ratio: 26 (ref 12–28)
BUN: 23 mg/dL (ref 8–27)
CHLORIDE: 102 mmol/L (ref 96–106)
CO2: 22 mmol/L (ref 20–29)
Calcium: 9.8 mg/dL (ref 8.7–10.3)
Creatinine, Ser: 0.9 mg/dL (ref 0.57–1.00)
GFR calc non Af Amer: 63 mL/min/{1.73_m2} (ref >59)
GFR, EST AFRICAN AMERICAN: 73 mL/min/{1.73_m2} (ref >59)
GLUCOSE: 89 mg/dL (ref 65–99)
POTASSIUM: 4.2 mmol/L (ref 3.5–5.2)
Sodium: 140 mmol/L (ref 134–144)

## 2017-09-04 LAB — LIPASE: Lipase: 27 U/L (ref 14–85)

## 2017-09-04 LAB — CEA: CEA: 3.3 ng/mL (ref 0.0–4.7)

## 2017-09-04 LAB — HEPATIC FUNCTION PANEL
ALT: 19 IU/L (ref 0–32)
AST: 25 IU/L (ref 0–40)
Albumin: 4.4 g/dL (ref 3.5–4.8)
Alkaline Phosphatase: 71 IU/L (ref 39–117)
BILIRUBIN TOTAL: 0.3 mg/dL (ref 0.0–1.2)
Bilirubin, Direct: 0.09 mg/dL (ref 0.00–0.40)
Total Protein: 6.7 g/dL (ref 6.0–8.5)

## 2017-09-04 LAB — TSH: TSH: 1.01 u[IU]/mL (ref 0.450–4.500)

## 2017-09-04 LAB — VITAMIN B12: VITAMIN B 12: 350 pg/mL (ref 232–1245)

## 2017-09-06 ENCOUNTER — Other Ambulatory Visit: Payer: Self-pay | Admitting: *Deleted

## 2017-09-06 DIAGNOSIS — E538 Deficiency of other specified B group vitamins: Secondary | ICD-10-CM

## 2017-09-06 LAB — SPECIMEN STATUS REPORT

## 2017-09-08 ENCOUNTER — Telehealth (INDEPENDENT_AMBULATORY_CARE_PROVIDER_SITE_OTHER): Payer: Self-pay | Admitting: Internal Medicine

## 2017-09-08 NOTE — Telephone Encounter (Signed)
err

## 2017-09-08 NOTE — Telephone Encounter (Signed)
Err

## 2017-09-08 NOTE — Telephone Encounter (Signed)
They are going to send a referral

## 2017-09-09 ENCOUNTER — Encounter: Payer: Self-pay | Admitting: Family Medicine

## 2017-09-09 LAB — METHYLMALONIC ACID, SERUM: Methylmalonic Acid: 228 nmol/L (ref 0–378)

## 2017-09-09 LAB — SPECIMEN STATUS REPORT

## 2017-09-09 NOTE — Addendum Note (Signed)
Addended by: Dairl Ponder on: 09/09/2017 09:54 AM   Modules accepted: Orders

## 2017-09-15 ENCOUNTER — Other Ambulatory Visit: Payer: Self-pay | Admitting: Family Medicine

## 2017-09-15 ENCOUNTER — Encounter: Payer: Self-pay | Admitting: Family Medicine

## 2017-09-15 ENCOUNTER — Ambulatory Visit (INDEPENDENT_AMBULATORY_CARE_PROVIDER_SITE_OTHER): Payer: Medicare Other | Admitting: Family Medicine

## 2017-09-15 VITALS — BP 110/80 | Ht 62.0 in | Wt 108.0 lb

## 2017-09-15 DIAGNOSIS — R251 Tremor, unspecified: Secondary | ICD-10-CM

## 2017-09-15 MED ORDER — CEFPROZIL 250 MG PO TABS: 250.0000 mg | ORAL_TABLET | Freq: Two times a day (BID) | ORAL | 0 refills | Status: DC

## 2017-09-15 NOTE — Patient Instructions (Signed)
We  Will set you up with Dr.Patel

## 2017-09-15 NOTE — Progress Notes (Signed)
   Subjective:    Patient ID: [name], female    DOB: Sep 14, 1940, 77 y.o.   MRN: [id number]  HPI  Patient is here today with complaints of tremors. Patient with tremor aspect She relates intermittently has tremor in her hands She states some days she will go through the day without having tremors other days she feels like she is tremoring in her hands and she notices a shake in her hands and she also notices some in her chest she has had a recent lab work including thyroid which was normal  She was worried that she was having a reoccurrence of colon cancer but her CEA is normal and she is not having any abdominal pain  I was also concerned that some of this could be related to poor eating but she states that she is now making sure that she eats something at lunch and she is trying to do the best she can and keeping her weight up  She denies dizziness she denies tremor in her voice she denies dizziness headaches.    Also has a bump on the side of anus.( She was asked to undress so you could look at this).  She relates its a little bit tender but not bleeding  Review of Systems  Constitutional: Negative for activity change, fatigue and fever.  HENT: Negative for congestion and rhinorrhea.   Respiratory: Negative for cough, chest tightness and shortness of breath.   Cardiovascular: Negative for chest pain and leg swelling.  Gastrointestinal: Negative for abdominal pain, constipation and diarrhea.  Skin: Negative for color change.  Neurological: Positive for tremors. Negative for dizziness, syncope, speech difficulty, light-headedness and headaches.  Psychiatric/Behavioral: Negative for behavioral problems.       Objective:   Physical Exam  Constitutional: She appears well-developed and well-nourished. No distress.  HENT:  Head: Normocephalic and atraumatic.  Eyes: Right eye exhibits no discharge. Left eye exhibits no discharge.  Neck: No tracheal deviation present.    Cardiovascular: Normal rate, regular rhythm and normal heart sounds.  No murmur heard. Pulmonary/Chest: Effort normal and breath sounds normal. No respiratory distress. She has no wheezes. She has no rales.  Musculoskeletal: She exhibits no edema.  Lymphadenopathy:    She has no cervical adenopathy.  Neurological: She is alert. She displays normal reflexes. She exhibits normal muscle tone. Coordination normal.  Skin: Skin is warm and dry. No erythema.  Psychiatric: Her behavior is normal.  Vitals reviewed.   Patient does have a fine tremor in her hands on today's visit Blood pressure sitting standing no appreciable change Negative cogwheeling Strength overall good  Anal region has a small bump which is consistent with early hemorrhoid but some associated redness and tenderness around it could have some localized infection will give a round of antibiotics    Assessment & Plan:  Tremors-very mild.  I would not recommend any medication for this.  We will have neurology see her to make sure there is not an underlying cause The patient denies anxiety or depression as a source   Anal bump with redness tenderness she will see gastroenterology next week short round of antibiotics warm soaks compresses should gradually get better avoid constipation

## 2017-09-15 NOTE — Progress Notes (Signed)
Referral placed.

## 2017-09-16 ENCOUNTER — Other Ambulatory Visit: Payer: Self-pay | Admitting: Cardiology

## 2017-09-20 ENCOUNTER — Telehealth (INDEPENDENT_AMBULATORY_CARE_PROVIDER_SITE_OTHER): Payer: Self-pay | Admitting: *Deleted

## 2017-09-20 ENCOUNTER — Encounter (INDEPENDENT_AMBULATORY_CARE_PROVIDER_SITE_OTHER): Payer: Self-pay | Admitting: *Deleted

## 2017-09-20 ENCOUNTER — Encounter (INDEPENDENT_AMBULATORY_CARE_PROVIDER_SITE_OTHER): Payer: Self-pay | Admitting: Internal Medicine

## 2017-09-20 ENCOUNTER — Ambulatory Visit (INDEPENDENT_AMBULATORY_CARE_PROVIDER_SITE_OTHER): Payer: Medicare Other | Admitting: Internal Medicine

## 2017-09-20 VITALS — BP 104/60 | HR 65 | Temp 97.4°F | Ht 62.0 in | Wt 107.8 lb

## 2017-09-20 DIAGNOSIS — R6881 Early satiety: Secondary | ICD-10-CM | POA: Diagnosis not present

## 2017-09-20 NOTE — Telephone Encounter (Signed)
Patient aware.

## 2017-09-20 NOTE — Patient Instructions (Signed)
The risks of bleeding, perforation and infection were reviewed with patient.  

## 2017-09-20 NOTE — Progress Notes (Signed)
Subjective:    Patient ID: [name], female    DOB: 1940/06/16, 77 y.o.   MRN: [id number]  HPI Here today with c/o early satiety.   Her last colonoscopy was in 2018 with the removal of one 20-25 mm polyp in the cecum. Two small polyps in the descending colon removed. Biopsy: Tubular adenoma. Next colonoscopy in 1 year. Hx of colon cancer in 2006 and resection.   She tells me today she has lost some weight. Today her weight is 107.8. IN September her weight was 112.  She is worried that her colon cancer has returned.  She is eating. When she eats she has fullness. She can eat a biscuit and she feels full. Has been occurring a couple of months.  No nausea or vomiting. She is trying to eat. She will have GERD if she misses her Prilosec. She has a BM daily.  Just lost a sister in November from CVA, CHF and Felty's syndrome.    Atrial fib and maintained on Eliquis.    09/03/2017 CEA 3.3.  Back pain and maintained on Hydrocodone.    Review of Systems Past Medical History:  Diagnosis Date  . Adenocarcinoma, colon ([place]) 03/2005   Stage I; resected in 03/2005  . Arthritis   . Gastroesophageal reflux disease   . Gastroparesis   . Hearing loss    mild  . Hyperlipidemia   . Irregular heartbeat   . Ovarian cyst 2008  . Paroxysmal atrial fibrillation (HCC)    Palpitations; maintained on flecainide; -normal coronary angiography and 1999; negative stress nuclear study in 11/2005  . PONV (postoperative nausea and vomiting)   . Tobacco abuse, in remission    10-pack-years; quit in 2001    Past Surgical History:  Procedure Laterality Date  . ABDOMINAL HYSTERECTOMY  1983   partial  . CHOLECYSTECTOMY    . COLON SURGERY  2006   Colon cancer  . COLONOSCOPY  03/25/2012   [name], MD  . COLONOSCOPY N/A 04/26/2017   Procedure: COLONOSCOPY;  Surgeon: [name], MD;  [facility];  Service: Endoscopy;  Laterality: N/A;  2:00  . COSMETIC SURGERY  09/2007  .  PARTIAL COLECTOMY  2006   adenocarcinoma  . POLYPECTOMY  04/26/2017   Procedure: POLYPECTOMY;  Surgeon: [name], MD;  [facility];  Service: Endoscopy;;  colon  . TONSILLECTOMY      No Known Allergies  Current Outpatient Medications on File Prior to Visit  Medication Sig Dispense Refill  . apixaban (ELIQUIS) 5 MG TABS tablet Take 1 tablet (5 mg total) by mouth 2 (two) times daily. 60 tablet 3  . Calcium Carb-Cholecalciferol (CALCIUM 1000 + D PO) Take 1 tablet by mouth daily.    . flecainide (TAMBOCOR) 100 MG tablet TAKE ONE TABLET EVERY 12 HOURS 180 tablet 3  . gabapentin (NEURONTIN) 100 MG capsule Take 200 mg by mouth 3 (three) times daily.   2  . HYDROcodone-acetaminophen (NORCO) 10-325 MG tablet Take 1 tablet by mouth every 6 (six) hours as needed (FOR PAIN.).     Marland Kitchen Lactulose SOLN Take 30 mLs by mouth at bedtime.     Marland Kitchen MEGARED OMEGA-3 KRILL OIL 500 MG CAPS Take 500 mg by mouth daily.     . NONFORMULARY OR COMPOUNDED ITEM Apply 1-2 g topically 3 (three) times daily as needed for pain. Dicl/Gab/Lidocaine 1%-5%-5% Cr (NERVE DAMAGE PAIN IN FOOT)  99  . omeprazole (PRILOSEC) 20 MG capsule  Take 20 mg by mouth daily before breakfast.     . ranitidine (ZANTAC) 150 MG tablet Take 150 mg by mouth at bedtime.    . rosuvastatin (CRESTOR) 40 MG tablet TAKE ONE (1) TABLET EACH DAY 90 tablet 1  . vitamin B-12 (CYANOCOBALAMIN) 1000 MCG tablet Take 1,000 mcg by mouth daily.     No current facility-administered medications on file prior to visit.         Objective:   Physical Exam Blood pressure 104/60, pulse 65, temperature (!) 97.4 F (36.3 C), height 5\' 2"  (1.575 m), weight 107 lb 12.8 oz (48.9 kg).   Alert and oriented. Skin warm and dry. Oral mucosa is moist.   . Sclera anicteric, conjunctivae is pink. Thyroid not enlarged. No cervical lymphadenopathy. Lungs clear. Heart regular rate and rhythm.  Abdomen is soft. Bowel sounds are positive. No hepatomegaly. No abdominal  masses felt. No tenderness.  No edema to lower extremities.        Assessment & Plan:  Early Satiety. I discussed with Dr Laural Golden: EGD  Hx of colon cancer. Due for colonoscopy in December of 2019

## 2017-09-20 NOTE — Telephone Encounter (Signed)
OK to hold Eliquis 2 days before procedure and resume night of procedure.

## 2017-09-20 NOTE — Telephone Encounter (Signed)
Patient is scheduled for endoscopy 10/18/17 -- she needs to stop Eliquis 2 days before procedure -- please advise if ok to stop

## 2017-09-27 ENCOUNTER — Encounter: Payer: Self-pay | Admitting: Family Medicine

## 2017-09-28 ENCOUNTER — Encounter: Payer: Self-pay | Admitting: Neurology

## 2017-09-30 DIAGNOSIS — M47816 Spondylosis without myelopathy or radiculopathy, lumbar region: Secondary | ICD-10-CM | POA: Diagnosis not present

## 2017-10-07 DIAGNOSIS — Z9889 Other specified postprocedural states: Secondary | ICD-10-CM | POA: Diagnosis not present

## 2017-10-07 DIAGNOSIS — M47816 Spondylosis without myelopathy or radiculopathy, lumbar region: Secondary | ICD-10-CM | POA: Diagnosis not present

## 2017-10-07 DIAGNOSIS — M545 Low back pain: Secondary | ICD-10-CM | POA: Diagnosis not present

## 2017-10-07 DIAGNOSIS — M48061 Spinal stenosis, lumbar region without neurogenic claudication: Secondary | ICD-10-CM | POA: Diagnosis not present

## 2017-10-07 DIAGNOSIS — M5136 Other intervertebral disc degeneration, lumbar region: Secondary | ICD-10-CM | POA: Diagnosis not present

## 2017-10-08 NOTE — Progress Notes (Signed)
Subjective:   [name] was seen in consultation in the movement disorder clinic at the request of Luking, Elayne Snare, MD.  This patient is accompanied in the office by her spouse who supplements the history.The evaluation is for tremor.   The records that were made available to me were reviewed.  Tremor started approximately 2 months ago and involves the bilateral UE and "I feel like the inside is shaking."  No medication changes when started.  It is slightly better as the day goes on.    Tremor is most noticeable when using the hands.  Husband states that it comes and goes.  She isn't sure if she can stop the tremor.  No known provacative factors.   Tremor happens daily, but the husband also describes spells that have been associated with the tremor sometimes.  She will have to go and lay down because she feels completely exhausted.  This is unusual for her.  Once she wakes up, then she will feel better.  There is no family hx of tremor.    Has a hx of a-fib but states that she hasn't been in a-fib for years.  Is on eliquis.    Affected by caffeine:  Unknown (doesn't drink caffeine) Affected by alcohol:  Unknown (does not drink) Affected by stress:  unknown Affected by fatigue:  No. Spills soup if on spoon:  No. Spills glass of liquid if full:  No. Affects ADL's (tying shoes, brushing teeth, etc):  No.  Current/Previously tried tremor medications: n/a  Current medications that may exacerbate tremor:  n/a  Outside reports reviewed: lab reports, office notes and referral letter/letters.  Dr. Posey Pronto did emg in 2016 which demonstrated chronic C6 radiculopathy in the left upper extremity, very mild.  There is also very mild chronic L4-L5 radiculopathy in the left lower extremity.  There is no evidence of peripheral neuropathy.  No Known Allergies  Outpatient Encounter Medications as of 10/12/2017  Medication Sig  . apixaban (ELIQUIS) 5 MG TABS tablet Take 1 tablet (5 mg total) by mouth 2 (two)  times daily.  . Calcium Carb-Cholecalciferol (CALCIUM 1000 + D PO) Take 1 tablet by mouth daily.  . flecainide (TAMBOCOR) 100 MG tablet TAKE ONE TABLET EVERY 12 HOURS  . gabapentin (NEURONTIN) 100 MG capsule Take 200 mg by mouth 3 (three) times daily.   Marland Kitchen HYDROcodone-acetaminophen (NORCO) 10-325 MG tablet Take 1 tablet by mouth every 6 (six) hours as needed for moderate pain.   Marland Kitchen lactulose (CHRONULAC) 10 GM/15ML solution Take 20 g by mouth at bedtime.  Marland Kitchen MEGARED OMEGA-3 KRILL OIL 500 MG CAPS Take 500 mg by mouth daily.   Marland Kitchen omeprazole (PRILOSEC) 20 MG capsule Take 20 mg by mouth daily before breakfast.   . ranitidine (ZANTAC) 150 MG tablet Take 150 mg by mouth at bedtime.  . rosuvastatin (CRESTOR) 40 MG tablet TAKE ONE (1) TABLET EACH DAY  . vitamin B-12 (CYANOCOBALAMIN) 1000 MCG tablet Take 1,000 mcg by mouth daily.  . [DISCONTINUED] Lactulose SOLN Take 30 mLs by mouth at bedtime.   . [DISCONTINUED] NONFORMULARY OR COMPOUNDED ITEM Apply 1-2 g topically 3 (three) times daily as needed for pain. Dicl/Gab/Lidocaine 1%-5%-5% Cr (NERVE DAMAGE PAIN IN FOOT)   No facility-administered encounter medications on file as of 10/12/2017.     Past Medical History:  Diagnosis Date  . Adenocarcinoma, colon ([place]) 03/2005   Stage I; resected in 03/2005  . Arthritis   . Gastroesophageal reflux disease   . Gastroparesis   .  Hearing loss    mild  . Hyperlipidemia   . Irregular heartbeat   . Ovarian cyst 2008  . Paroxysmal atrial fibrillation (HCC)    Palpitations; maintained on flecainide; -normal coronary angiography and 1999; negative stress nuclear study in 11/2005  . PONV (postoperative nausea and vomiting)   . Tobacco abuse, in remission    10-pack-years; quit in 2001    Past Surgical History:  Procedure Laterality Date  . ABDOMINAL HYSTERECTOMY  1983   partial  . CHOLECYSTECTOMY    . COLON SURGERY  2006   Colon cancer  . COLONOSCOPY  03/25/2012   [name], MD  . COLONOSCOPY N/A  04/26/2017   Procedure: COLONOSCOPY;  Surgeon: [name], MD;  [facility]: [facility];  Service: Endoscopy;  Laterality: N/A;  2:00  . COSMETIC SURGERY  09/2007  . PARTIAL COLECTOMY  2006   adenocarcinoma  . POLYPECTOMY  04/26/2017   Procedure: POLYPECTOMY;  Surgeon: [name], MD;  Location: [facility];  Service: Endoscopy;;  colon  . TONSILLECTOMY      Social History   Socioeconomic History  . Marital status: Married    Spouse name: Not on file  . Number of children: Not on file  . Years of education: Not on file  . Highest education level: Not on file  Occupational History  . Occupation: Medical laboratory scientific officer  . Financial resource strain: Not on file  . Food insecurity:    Worry: Not on file    Inability: Not on file  . Transportation needs:    Medical: Not on file    Non-medical: Not on file  Tobacco Use  . Smoking status: Former Smoker    Packs/day: 0.30    Years: 40.00    Pack years: 12.00    Start date: 02/23/1963    Last attempt to quit: 03/25/2002    Years since quitting: 15.5  . Smokeless tobacco: Never Used  . Tobacco comment: Quit smoking in 2001  Substance and Sexual Activity  . Alcohol use: No    Alcohol/week: 0.0 oz  . Drug use: No  . Sexual activity: Yes    Birth control/protection: Surgical  Lifestyle  . Physical activity:    Days per week: Not on file    Minutes per session: Not on file  . Stress: Not on file  Relationships  . Social connections:    Talks on phone: Not on file    Gets together: Not on file    Attends religious service: Not on file    Active member of club or organization: Not on file    Attends meetings of clubs or organizations: Not on file    Relationship status: Not on file  . Intimate partner violence:    Fear of current or ex partner: Not on file    Emotionally abused: Not on file    Physically abused: Not on file    Forced sexual activity: Not on file  Other Topics Concern  . Not on file    Social History Narrative   Lives with husband in a one story home.  Has 3 children.  Retired from Medical sales representative work.  Education: some college.    Family Status  Relation Name Status  . Mother  Deceased  . Father  Deceased  . Brother  Alive  . Sister 2 Alive  . Son  Alive  . Sister  Deceased    Review of Systems Review of Systems  Constitutional:  Positive for malaise/fatigue.  HENT: Negative.   Eyes: Negative.   Respiratory: Negative.   Cardiovascular: Negative.   Genitourinary: Negative.   Musculoskeletal: Positive for back pain (chronic).       Foot pain - L foot   Skin: Positive for itching (L ankle).  Neurological: Positive for tremors.  Psychiatric/Behavioral: Negative.       Objective:   VITALS:   Vitals:   10/12/17 1307  BP: 108/60  Pulse: 70  SpO2: 98%  Weight: 110 lb (49.9 kg)  Height: 5\' 2"  (1.575 m)   Gen:  Appears stated age and in NAD. HEENT:  Normocephalic, atraumatic. The mucous membranes are moist. The superficial temporal arteries are without ropiness or tenderness. Cardiovascular: Regular rate and rhythm. Lungs: Clear to auscultation bilaterally. Neck: There are no carotid bruits noted bilaterally.  NEUROLOGICAL:  Orientation:  The patient is alert and oriented x 3.  Recent and remote memory are intact.  Attention span and concentration are normal.  Able to name objects and repeat without trouble.  Fund of knowledge is appropriate Cranial nerves: There is good facial symmetry. The pupils are equal round and reactive to light bilaterally. Fundoscopic exam reveals clear disc margins bilaterally. Extraocular muscles are intact and visual fields are full to confrontational testing. Speech is fluent and clear. Soft palate rises symmetrically and there is no tongue deviation. Hearing is intact to conversational tone. Tone: Tone is good throughout. Sensation: Sensation is intact to light touch and pinprick throughout (facial, trunk, extremities). Vibration  is intact at the bilateral big toe. There is no extinction with double simultaneous stimulation. There is no sensory dermatomal level identified. Coordination:  The patient has no dysdiadichokinesia or dysmetria.  She has no decremation, with any form of RAMS, including alternating supination and pronation of the forearm, hand opening and closing, finger taps, heel taps and toe taps. Motor: Strength is 5/5 in the bilateral upper and lower extremities.  Shoulder shrug is equal bilaterally.  There is no pronator drift.  There are no fasciculations noted. DTR's: Deep tendon reflexes are 2/4 at the bilateral biceps, triceps, brachioradialis, patella and absent at the bilateral achilles.  Plantar responses are downgoing bilaterally. Gait and Station: The patient is able to ambulate without difficulty. The patient is able to heel toe walk without any difficulty. The patient is able to ambulate in a tandem fashion. The patient is able to stand in the Romberg position.   MOVEMENT EXAM: Tremor:  There is no postural tremor.  The patient is able to draw Archimedes spirals without significant difficulty but tremor is noted.  There is some intention tremor.  There is no tremor at rest.  The patient is  able to pour water from one glass to another without spilling it but some tremor is evident.    Labs: Lab Results  Component Value Date   TSH 1.010 09/03/2017     Chemistry      Component Value Date/Time   NA 140 09/03/2017 1539   K 4.2 09/03/2017 1539   CL 102 09/03/2017 1539   CO2 22 09/03/2017 1539   BUN 23 09/03/2017 1539   CREATININE 0.90 09/03/2017 1539   CREATININE 0.94 12/25/2013 1037      Component Value Date/Time   CALCIUM 9.8 09/03/2017 1539   ALKPHOS 71 09/03/2017 1539   AST 25 09/03/2017 1539   ALT 19 09/03/2017 1539   BILITOT 0.3 09/03/2017 1539     Lab Results  Component Value Date   VITAMINB12 350  09/03/2017        Assessment/Plan:   1.  Tremor  -She has some tremor  today, most indicative of essential tremor.  She and I talked about nature and pathophysiology of this.  She cannot take primidone due to interaction with Eliquis.  We discussed propranolol, but her blood pressure is already fairly low.  In the end, she really does not want medication.  I was more concerned about other spells that her husband describes can be associated with external and internal tremor, in which she will begin to feel very tired and have to go lay down.  I wondered if she was having paroxysmal A. fib (known history of A. fib) and having palpitations that she describes as inner tremor.  She does not think so, as she states that she has been out of atrial fibrillation for over 10 years.  I would like to do a Holter monitor just to see if we can capture any associated spells.  The spells do not sound neurologic.  We will call her back with the results of this.  If she ultimately decides that she wants to try medication, we can do this but I do not necessarily recommend it.  -reassured pt there is no evidence of PD  2.  b12 deficiency  -just started supplement  3.  F/u prn, depending on results of above.  CC:  Kathyrn Drown, MD

## 2017-10-12 ENCOUNTER — Encounter: Payer: Self-pay | Admitting: Neurology

## 2017-10-12 ENCOUNTER — Ambulatory Visit (INDEPENDENT_AMBULATORY_CARE_PROVIDER_SITE_OTHER): Payer: Medicare Other | Admitting: Neurology

## 2017-10-12 VITALS — BP 108/60 | HR 70 | Ht 62.0 in | Wt 110.0 lb

## 2017-10-12 DIAGNOSIS — R002 Palpitations: Secondary | ICD-10-CM

## 2017-10-12 DIAGNOSIS — I480 Paroxysmal atrial fibrillation: Secondary | ICD-10-CM

## 2017-10-12 DIAGNOSIS — G250 Essential tremor: Secondary | ICD-10-CM | POA: Diagnosis not present

## 2017-10-12 NOTE — Patient Instructions (Addendum)
1. We have referred you to have a Holter Monitor with Cardiology. They will call you directly to schedule. If you do not hear from them please call [phone].

## 2017-10-14 ENCOUNTER — Telehealth: Payer: Self-pay | Admitting: Neurology

## 2017-10-14 DIAGNOSIS — R002 Palpitations: Secondary | ICD-10-CM

## 2017-10-14 DIAGNOSIS — I480 Paroxysmal atrial fibrillation: Secondary | ICD-10-CM

## 2017-10-14 NOTE — Telephone Encounter (Signed)
yes

## 2017-10-14 NOTE — Telephone Encounter (Signed)
Butch Penny called from [facility] regarding the order that Dr. Carles Collet put in for a 72 hour monitor. She said they only have 24/48 or an event monitor which is 3 to 4 weeks. She would like you to please call her to clarify. Thanks

## 2017-10-14 NOTE — Telephone Encounter (Signed)
Holter 72 hr cancelled.  Holter 48 hr ordered.  Called [facility] but was placed on a long hold. They should see new order and schedule accordingly.

## 2017-10-14 NOTE — Telephone Encounter (Signed)
48 hour monitor okay?

## 2017-10-14 NOTE — Addendum Note (Signed)
Addended byAnnamaria Helling on: 10/14/2017 12:43 PM   Modules accepted: Orders

## 2017-10-18 ENCOUNTER — Ambulatory Visit (HOSPITAL_COMMUNITY)
Admission: RE | Admit: 2017-10-18 | Discharge: 2017-10-18 | Disposition: A | Discharge status: Home / Self Care | Payer: Medicare Other | Source: Ambulatory Visit | Attending: Internal Medicine | Admitting: Internal Medicine

## 2017-10-18 ENCOUNTER — Other Ambulatory Visit: Payer: Self-pay

## 2017-10-18 ENCOUNTER — Encounter (HOSPITAL_COMMUNITY)
Admission: RE | Disposition: A | Discharge status: Home / Self Care | Payer: Self-pay | Source: Ambulatory Visit | Attending: Internal Medicine

## 2017-10-18 ENCOUNTER — Encounter (HOSPITAL_COMMUNITY): Payer: Self-pay | Admitting: *Deleted

## 2017-10-18 DIAGNOSIS — K317 Polyp of stomach and duodenum: Secondary | ICD-10-CM | POA: Insufficient documentation

## 2017-10-18 DIAGNOSIS — K219 Gastro-esophageal reflux disease without esophagitis: Secondary | ICD-10-CM | POA: Diagnosis not present

## 2017-10-18 DIAGNOSIS — Z87891 Personal history of nicotine dependence: Secondary | ICD-10-CM | POA: Insufficient documentation

## 2017-10-18 DIAGNOSIS — E785 Hyperlipidemia, unspecified: Secondary | ICD-10-CM | POA: Insufficient documentation

## 2017-10-18 DIAGNOSIS — K449 Diaphragmatic hernia without obstruction or gangrene: Secondary | ICD-10-CM | POA: Insufficient documentation

## 2017-10-18 DIAGNOSIS — Z9049 Acquired absence of other specified parts of digestive tract: Secondary | ICD-10-CM | POA: Insufficient documentation

## 2017-10-18 DIAGNOSIS — R6881 Early satiety: Secondary | ICD-10-CM | POA: Insufficient documentation

## 2017-10-18 DIAGNOSIS — Z79899 Other long term (current) drug therapy: Secondary | ICD-10-CM | POA: Insufficient documentation

## 2017-10-18 DIAGNOSIS — Z7901 Long term (current) use of anticoagulants: Secondary | ICD-10-CM | POA: Diagnosis not present

## 2017-10-18 DIAGNOSIS — M545 Low back pain: Secondary | ICD-10-CM | POA: Diagnosis not present

## 2017-10-18 DIAGNOSIS — I480 Paroxysmal atrial fibrillation: Secondary | ICD-10-CM | POA: Insufficient documentation

## 2017-10-18 DIAGNOSIS — G8929 Other chronic pain: Secondary | ICD-10-CM | POA: Diagnosis not present

## 2017-10-18 DIAGNOSIS — Z85038 Personal history of other malignant neoplasm of large intestine: Secondary | ICD-10-CM | POA: Diagnosis not present

## 2017-10-18 DIAGNOSIS — R110 Nausea: Secondary | ICD-10-CM | POA: Diagnosis not present

## 2017-10-18 DIAGNOSIS — K3184 Gastroparesis: Secondary | ICD-10-CM | POA: Insufficient documentation

## 2017-10-18 HISTORY — PX: ESOPHAGOGASTRODUODENOSCOPY: SHX5428

## 2017-10-18 HISTORY — PX: BIOPSY: SHX5522

## 2017-10-18 SURGERY — EGD (ESOPHAGOGASTRODUODENOSCOPY)
Anesthesia: Moderate Sedation

## 2017-10-18 MED ORDER — SODIUM CHLORIDE 0.9 % IV SOLN
INTRAVENOUS | Status: DC
  Administered 2017-10-18: 09:00:00 via INTRAVENOUS

## 2017-10-18 MED ORDER — STERILE WATER FOR IRRIGATION IR SOLN
Status: DC | PRN
  Administered 2017-10-18: 10:00:00

## 2017-10-18 MED ORDER — MEPERIDINE HCL 50 MG/ML IJ SOLN
INTRAMUSCULAR | Status: DC | PRN
  Administered 2017-10-18 (×2): 25 mg via INTRAVENOUS

## 2017-10-18 MED ORDER — LIDOCAINE VISCOUS HCL 2 % MT SOLN
OROMUCOSAL | Status: AC
  Filled 2017-10-18: qty 15

## 2017-10-18 MED ORDER — LIDOCAINE VISCOUS HCL 2 % MT SOLN
OROMUCOSAL | Status: DC | PRN
  Administered 2017-10-18: 4 mL via OROMUCOSAL

## 2017-10-18 MED ORDER — MEPERIDINE HCL 50 MG/ML IJ SOLN
INTRAMUSCULAR | Status: AC
  Filled 2017-10-18: qty 1

## 2017-10-18 MED ORDER — MIDAZOLAM HCL 5 MG/5ML IJ SOLN
INTRAMUSCULAR | Status: DC | PRN
  Administered 2017-10-18 (×2): 2 mg via INTRAVENOUS

## 2017-10-18 MED ORDER — MIDAZOLAM HCL 5 MG/5ML IJ SOLN
INTRAMUSCULAR | Status: AC
  Filled 2017-10-18: qty 10

## 2017-10-18 NOTE — Op Note (Signed)
[facility] Patient Name: [name] Procedure Date: 10/18/2017 9:34 AM MRN: [id number] Date of Birth: 09-Mar-1941 Attending MD: Hildred Laser , MD CSN: [id number] Age: 77 Admit Type: Outpatient Procedure:                Upper GI endoscopy Indications:              Early satiety, Nausea Providers:                Hildred Laser, MD, Otis Peak B. Sharon Seller, RN, Aram Candela Referring MD:             Elayne Snare. Wolfgang Phoenix, MD Medicines:                Lidocaine spray, Meperidine 50 mg IV, Midazolam 4                            mg IV Complications:            No immediate complications. Estimated Blood Loss:     Estimated blood loss was minimal. Procedure:                Pre-Anesthesia Assessment:                           - Prior to the procedure, a History and Physical                            was performed, and patient medications and                            allergies were reviewed. The patient's tolerance of                            previous anesthesia was also reviewed. The risks                            and benefits of the procedure and the sedation                            options and risks were discussed with the patient.                            All questions were answered, and informed consent                            was obtained. Prior Anticoagulants: The patient                            last took Eliquis (apixaban) 3 days prior to the                            procedure. ASA Grade Assessment: III - A patient  with severe systemic disease. After reviewing the                            risks and benefits, the patient was deemed in                            satisfactory condition to undergo the procedure.                           After obtaining informed consent, the endoscope was                            passed under direct vision. Throughout the                            procedure, the patient's blood  pressure, pulse, and                            oxygen saturations were monitored continuously. The                            [id number] ([id number]) scope was introduced through the                            mouth, and advanced to the second part of duodenum.                            The upper GI endoscopy was accomplished without                            difficulty. The patient tolerated the procedure                            well. Scope In: 10:06:34 AM Scope Out: 10:13:57 AM Total Procedure Duration: 0 hours 7 minutes 23 seconds  Findings:      The examined esophagus was normal.      The Z-line was regular and was found 38 cm from the incisors.      A 2 cm hiatal hernia was present.      A few small sessile polyps were found in the gastric fundus, in the       gastric body and in the prepyloric region of the stomach. Biopsies were       taken with a cold forceps for histology. The pathology specimen was       placed into Bottle Number 1.      The exam of the stomach was otherwise normal.      The duodenal bulb and second portion of the duodenum were normal. Impression:               - Normal esophagus.                           - Z-line regular, 38 cm from the incisors.                           -  2 cm hiatal hernia.                           - A few gastric polyps. Biopsied.                           - Normal duodenal bulb and second portion of the                            duodenum. Moderate Sedation:      Moderate (conscious) sedation was administered by the endoscopy nurse       and supervised by the endoscopist. The following parameters were       monitored: oxygen saturation, heart rate, blood pressure, CO2       capnography and response to care. Total physician intraservice time was       12 minutes. Recommendation:           - Patient has a contact number available for                            emergencies. The signs and symptoms of potential                             delayed complications were discussed with the                            patient. Return to normal activities tomorrow.                            Written discharge instructions were provided to the                            patient.                           - Resume previous diet today.                           - Resume Eliquis (apixaban) at prior dose tomorrow.                           - Continue present medications.                           - Await pathology results. Procedure Code(s):        --- Professional ---                           [phone], Esophagogastroduodenoscopy, flexible,                            transoral; with biopsy, single or multiple                           G0500, Moderate sedation services provided by the  same physician or other qualified health care                            professional performing a gastrointestinal                            endoscopic service that sedation supports,                            requiring the presence of an independent trained                            observer to assist in the monitoring of the                            patient's level of consciousness and physiological                            status; initial 15 minutes of intra-service time;                            patient age 49 years or older (additional time may                            be reported with [phone], as appropriate) Diagnosis Code(s):        --- Professional ---                           K44.9, Diaphragmatic hernia without obstruction or                            gangrene                           K31.7, Polyp of stomach and duodenum                           R68.81, Early satiety                           R11.0, Nausea CPT copyright 2017 American Medical Association. All rights reserved. The codes documented in this report are preliminary and upon coder review may  be revised to meet current compliance  requirements. Hildred Laser, MD Hildred Laser, MD 10/18/2017 10:23:19 AM This report has been signed electronically. Number of Addenda: 0

## 2017-10-18 NOTE — H&P (Addendum)
[name] is an 77 y.o. female.   Chief Complaint: Patient is here for diagnostic EGD. HPI: Patient is 77 year old Caucasian female who presents with several week history of early satiety.  She has lost 5 pounds.  She says her appetite is fine she just cannot eat.  She also has been experiencing intermittent right lower quadrant abdominal pain.  She has had nausea but no vomiting.  Bowels move daily as long as she takes lactulose.  She denies melena or rectal bleeding.  She has not smoked cigarettes in over 10 years and she does not drink alcohol.  She has chronic low back pain which she is had since back surgery 4 years ago and takes pain medication no more than twice a day. She had blood work about 6 weeks ago.  CBC metabolic 7 and hepatic panel were all normal. She has been off Eliquis for 3 days.  Past Medical History:  Diagnosis Date  . Adenocarcinoma, colon ([place]) 03/2005   Stage I; resected in 03/2005  . Arthritis   . Gastroesophageal reflux disease   . Gastroparesis   . Hearing loss    mild  . Hyperlipidemia   . Irregular heartbeat   . Ovarian cyst 2008  . Paroxysmal atrial fibrillation ([facility])    Palpitations; maintained on flecainide; -normal coronary angiography and 1999; negative stress nuclear study in 11/2005  . PONV (postoperative nausea and vomiting)   . Tobacco abuse, in remission    10-pack-years; quit in 2001    Past Surgical History:  Procedure Laterality Date  . ABDOMINAL HYSTERECTOMY  1983   partial  . CHOLECYSTECTOMY    . COLON SURGERY  2006   Colon cancer  . COLONOSCOPY  03/25/2012   [name], MD  . COLONOSCOPY N/A 04/26/2017   Procedure: COLONOSCOPY;  Surgeon: [name], MD;  [facility];  Service: Endoscopy;  Laterality: N/A;  2:00  . COSMETIC SURGERY  09/2007  . PARTIAL COLECTOMY  2006   adenocarcinoma  . POLYPECTOMY  04/26/2017   Procedure: POLYPECTOMY;  Surgeon: [name], MD;  [facility];  Service:  Endoscopy;;  colon  . TONSILLECTOMY      Family History  Problem Relation Age of Onset  . Heart disease Mother   . Stroke Mother   . Heart attack Mother   . Cancer Father        lung  . Heart disease Father   . Hypertension Father   . Cancer Brother        rectal  . Healthy Son   . Heart failure Sister   . Stroke Sister    Social History:  reports that she quit smoking about 15 years ago. She started smoking about 54 years ago. She has a 12.00 pack-year smoking history. She has never used smokeless tobacco. She reports that she does not drink alcohol or use drugs.  Allergies: No Known Allergies  Medications Prior to Admission  Medication Sig Dispense Refill  . apixaban (ELIQUIS) 5 MG TABS tablet Take 1 tablet (5 mg total) by mouth 2 (two) times daily. 60 tablet 3  . Calcium Carb-Cholecalciferol (CALCIUM 1000 + D PO) Take 1 tablet by mouth daily.    . flecainide (TAMBOCOR) 100 MG tablet TAKE ONE TABLET EVERY 12 HOURS 180 tablet 3  . gabapentin (NEURONTIN) 100 MG capsule Take 200 mg by mouth 3 (three) times daily.   2  . HYDROcodone-acetaminophen (NORCO) 10-325 MG tablet Take 1 tablet  by mouth every 6 (six) hours as needed for moderate pain.     Marland Kitchen lactulose (CHRONULAC) 10 GM/15ML solution Take 20 g by mouth at bedtime.    Marland Kitchen MEGARED OMEGA-3 KRILL OIL 500 MG CAPS Take 500 mg by mouth daily.     Marland Kitchen omeprazole (PRILOSEC) 20 MG capsule Take 20 mg by mouth daily before breakfast.     . ranitidine (ZANTAC) 150 MG tablet Take 150 mg by mouth at bedtime.    . rosuvastatin (CRESTOR) 40 MG tablet TAKE ONE (1) TABLET EACH DAY 90 tablet 1  . vitamin B-12 (CYANOCOBALAMIN) 1000 MCG tablet Take 1,000 mcg by mouth daily.      No results found for this or any previous visit (from the past 48 hour(s)). No results found.  ROS  Blood pressure (!) 150/67, pulse 63, temperature 97.7 F (36.5 C), temperature source Oral, resp. rate 14, SpO2 100 %. Physical Exam  Constitutional:  Well-developed  thin Caucasian female in NAD.  HENT:  Mouth/Throat: Oropharynx is clear and moist.  Eyes: Conjunctivae are normal. No scleral icterus.  Neck: No thyromegaly present.  Cardiovascular: Normal rate, regular rhythm and normal heart sounds.  No murmur heard. Respiratory: Effort normal and breath sounds normal.  GI:  Abdomen is symmetrical with lower midline scar.  On palpation is soft and nontender with organomegaly or masses.  Musculoskeletal: She exhibits no edema.  Lymphadenopathy:    She has no cervical adenopathy.  Neurological: She is alert.  Skin: Skin is warm and dry.     Assessment/Plan Early satiety and nausea. Minimal weight loss. Diagnostic EGD.  Hildred Laser, MD 10/18/2017, 9:55 AM

## 2017-10-18 NOTE — Discharge Instructions (Signed)
Resume apixaban on 10/19/2017. Resume other medications as before. Resume usual diet. No driving for 24 hours. Physician will call with biopsy results. Gastrointestinal Endoscopy, Care After Refer to this sheet in the next few weeks. These instructions provide you with information about caring for yourself after your procedure. Your health care provider may also give you more specific instructions. Your treatment has been planned according to current medical practices, but problems sometimes occur. Call your health care provider if you have any problems or questions after your procedure. What can I expect after the procedure? After your procedure, it is common to feel:  Bloated.  Soreness in your throat.  Sleepy.  Follow these instructions at home:  Do not drive for 24 hours if you received a if you received a medicine to help you relax (sedative).  Avoid drinking warm beverages and alcohol for the first 24 hours after the procedure.  Take over-the-counter and prescription medicines only as told by your health care provider.  Drink enough fluids to keep your urine clear or pale yellow.  If you feel bloated, try going for a walk. Walking may help the feeling go away.  If your throat is sore, try gargling with salt water. Get help right away if:  You have severe nausea or vomiting.  You have severe abdominal pain, abdominal cramps that last longer than 6 hours, or abdominal swelling.  You have severe shoulder or back pain.  You have trouble swallowing.  You have shortness of breath, your breathing is shallow, or you breathing is faster than normal.  You have a fever.  Your heart is beating very fast.  You vomit blood or material that looks like coffee grounds.  You have bloody, black, or tarry stools. This information is not intended to replace advice given to you by your health care provider. Make sure you discuss any questions you have with your health care  provider. Document Released: 12/10/2003 Document Revised: 03/04/2016 Document Reviewed: 02/17/2015 Elsevier Interactive Patient Education  2018 [employer].  Gastric Polyps A gastric polyp, also called a stomach polyp, is a growth on the lining of the stomach. Most polyps are not dangerous, but some can be harmful because of their size, location, or type. Polyps that can become harmful include:  Large polyps. These can turn into sores (ulcers). Ulcers can lead to stomach bleeding.  Polyps that block food from moving from the stomach to the small intestine (gastric outlet obstruction).  A type of polyp called an adenoma. This type of polyp can become cancerous.  What are the causes? Gastric polyps form when the lining of the stomach gets inflamed or damaged. Stomach inflammation and damage may be caused by:  A long-lasting stomach condition, such as gastritis.  Certain medicines used to reduce stomach acid.  An inherited condition called familial adenomatous polyposis.  What are the signs or symptoms? Usually, this condition does not cause any symptoms. If you do have symptoms, they may include:  Pain or tenderness in the abdomen.  Nausea.  Trouble eating or swallowing.  Blood in the stool.  Anemia.  How is this diagnosed? Gastric polyps are diagnosed with:  A medical procedure called endoscopy.  A lab test in which a part of the polyp is examined. This test is done with a sample of polyp tissue (biopsy) taken during an endoscopy.  How is this treated? Treatment depends on the type, location, and size of the polyps. Treatment may involve:  Having the polyps checked regularly with  an endoscopy.  Having the polyps removed with an endoscopy. This may be done if the polyps are harmful or can become harmful. Removing a polyp often prevents problems from developing.  Having the polyps removed with a surgery called a partial gastrectomy. This may be done in rare cases to  remove very large polyps.  Treating the underlying condition that caused the polyps.  Follow these instructions at home:  Take over-the-counter and prescription medicines only as told by your health care provider.  Keep all follow-up visits as told by your health care provider. This is important. Contact a health care provider if:  You develop new symptoms.  Your symptoms get worse. Get help right away if:  You vomit blood.  You have severe abdominal pain.  You cannot eat or drink.  You have blood in your stool. This information is not intended to replace advice given to you by your health care provider. Make sure you discuss any questions you have with your health care provider. Document Released: 04/13/2012 Document Revised: 09/16/2015 Document Reviewed: 05/12/2015 Elsevier Interactive Patient Education  2018 [employer].  Hiatal Hernia A hiatal hernia occurs when part of the stomach slides above the muscle that separates the abdomen from the chest (diaphragm). A person can be born with a hiatal hernia (congenital), or it may develop over time. In almost all cases of hiatal hernia, only the top part of the stomach pushes through the diaphragm. Many people have a hiatal hernia with no symptoms. The larger the hernia, the more likely it is that you will have symptoms. In some cases, a hiatal hernia allows stomach acid to flow back into the tube that carries food from your mouth to your stomach (esophagus). This may cause heartburn symptoms. Severe heartburn symptoms may mean that you have developed a condition called gastroesophageal reflux disease (GERD). What are the causes? This condition is caused by a weakness in the opening (hiatus) where the esophagus passes through the diaphragm to attach to the upper part of the stomach. A person may be born with a weakness in the hiatus, or a weakness can develop over time. What increases the risk? This condition is more likely to develop  in:  Older people. Age is a major risk factor for a hiatal hernia, especially if you are over the age of 4.  Pregnant women.  People who are overweight.  People who have frequent constipation.  What are the signs or symptoms? Symptoms of this condition usually develop in the form of GERD symptoms. Symptoms include:  Heartburn.  Belching.  Indigestion.  Trouble swallowing.  Coughing or wheezing.  Sore throat.  Hoarseness.  Chest pain.  Nausea and vomiting.  How is this diagnosed? This condition may be diagnosed during testing for GERD. Tests that may be done include:  X-rays of your stomach or chest.  An upper gastrointestinal (GI) series. This is an X-ray exam of your GI tract that is taken after you swallow a chalky liquid that shows up clearly on the X-ray.  Endoscopy. This is a procedure to look into your stomach using a thin, flexible tube that has a tiny camera and light on the end of it.  How is this treated? This condition may be treated by:  Dietary and lifestyle changes to help reduce GERD symptoms.  Medicines. These may include: ? Over-the-counter antacids. ? Medicines that make your stomach empty more quickly. ? Medicines that block the production of stomach acid (H2 blockers). ? Stronger medicines to  reduce stomach acid (proton pump inhibitors).  Surgery to repair the hernia, if other treatments are not helping.  If you have no symptoms, you may not need treatment. Follow these instructions at home: Lifestyle and activity  Do not use any products that contain nicotine or tobacco, such as cigarettes and e-cigarettes. If you need help quitting, ask your health care provider.  Try to achieve and maintain a healthy body weight.  Avoid putting pressure on your abdomen. Anything that puts pressure on your abdomen increases the amount of acid that may be pushed up into your esophagus. ? Avoid bending over, especially after eating. ? Raise the head  of your bed by putting blocks under the legs. This keeps your head and esophagus higher than your stomach. ? Do not wear tight clothing around your chest or stomach. ? Try not to strain when having a bowel movement, when urinating, or when lifting heavy objects. Eating and drinking  Avoid foods that can worsen GERD symptoms. These may include: ? Fatty foods, like fried foods. ? Citrus fruits, like oranges or lemon. ? Other foods and drinks that contain acid, like orange juice or tomatoes. ? Spicy food. ? Chocolate.  Eat frequent small meals instead of three large meals a day. This helps prevent your stomach from getting too full. ? Eat slowly. ? Do not lie down right after eating. ? Do not eat 1-2 hours before bed.  Do not drink beverages with caffeine. These include cola, coffee, cocoa, and tea.  Do not drink alcohol. General instructions  Take over-the-counter and prescription medicines only as told by your health care provider.  Keep all follow-up visits as told by your health care provider. This is important. Contact a health care provider if:  Your symptoms are not controlled with medicines or lifestyle changes.  You are having trouble swallowing.  You have coughing or wheezing that will not go away. Get help right away if:  Your pain is getting worse.  Your pain spreads to your arms, neck, jaw, teeth, or back.  You have shortness of breath.  You sweat for no reason.  You feel sick to your stomach (nauseous) or you vomit.  You vomit blood.  You have bright red blood in your stools.  You have black, tarry stools. This information is not intended to replace advice given to you by your health care provider. Make sure you discuss any questions you have with your health care provider. Document Released: 07/18/2003 Document Revised: 04/20/2016 Document Reviewed: 04/20/2016 Elsevier Interactive Patient Education  [employer].

## 2017-10-19 ENCOUNTER — Other Ambulatory Visit: Payer: Self-pay | Admitting: Family Medicine

## 2017-10-19 ENCOUNTER — Other Ambulatory Visit: Payer: Self-pay | Admitting: Cardiology

## 2017-10-25 ENCOUNTER — Telehealth: Payer: Self-pay | Admitting: Neurology

## 2017-10-25 NOTE — Telephone Encounter (Signed)
Left message on machine for patient to call back.

## 2017-10-25 NOTE — Telephone Encounter (Signed)
Patient returning phone call best call back # [phone].

## 2017-10-25 NOTE — Telephone Encounter (Signed)
Patient called and lmom regarding her Holter Monitor. Please Call Thanks

## 2017-10-25 NOTE — Telephone Encounter (Signed)
Spoke with patient. She scheduled Holter Monitor in [place] at [facility] location, but wants to see if they will do this in [place]. She was given the number to see if they could move her test. Will call back if needed.

## 2017-11-01 ENCOUNTER — Encounter (HOSPITAL_COMMUNITY): Payer: Self-pay | Admitting: Internal Medicine

## 2017-11-01 ENCOUNTER — Ambulatory Visit (HOSPITAL_COMMUNITY)
Admission: RE | Admit: 2017-11-01 | Discharge: 2017-11-01 | Disposition: A | Discharge status: Home / Self Care | Payer: Medicare Other | Source: Ambulatory Visit | Attending: Neurology | Admitting: Neurology

## 2017-11-01 DIAGNOSIS — I480 Paroxysmal atrial fibrillation: Secondary | ICD-10-CM | POA: Diagnosis not present

## 2017-11-01 DIAGNOSIS — R002 Palpitations: Secondary | ICD-10-CM | POA: Diagnosis not present

## 2017-11-02 ENCOUNTER — Encounter: Payer: Self-pay | Admitting: Cardiology

## 2017-11-02 ENCOUNTER — Ambulatory Visit (INDEPENDENT_AMBULATORY_CARE_PROVIDER_SITE_OTHER): Payer: Medicare Other | Admitting: Cardiology

## 2017-11-02 VITALS — BP 106/64 | HR 73 | Ht 62.0 in | Wt 110.0 lb

## 2017-11-02 DIAGNOSIS — E782 Mixed hyperlipidemia: Secondary | ICD-10-CM | POA: Diagnosis not present

## 2017-11-02 DIAGNOSIS — I4891 Unspecified atrial fibrillation: Secondary | ICD-10-CM

## 2017-11-02 DIAGNOSIS — R0789 Other chest pain: Secondary | ICD-10-CM | POA: Diagnosis not present

## 2017-11-02 NOTE — Progress Notes (Signed)
Clinical Summary Ms. Galluzzo is a 77 y.o.female seen today for follow up of the following medical problems.   1. Afib  - has done well on flecanidefor severla years   - no recent palpitations - compliant with meds. No bleeding issues on eliquis.   2. Atypical chest pain - long history, prior ischemic testing has been negative  - seen 08/2017 by PA Strader for atypical chest pain. - 08/2017 nuclear stress negative.  - no recurrent symptoms  3. Hyperlipidemia -Oct 2018: TC 173 TG 105 HDL 66 LDL 86  - remains compliant with statin  4. Early satiety - followed by GI, recent weight loss.     SH: husband with 3 surgeries recently. She has been helping at home. Sister passed in November.     Past Medical History:  Diagnosis Date  . Adenocarcinoma, colon ([place]) 03/2005   Stage I; resected in 03/2005  . Arthritis   . Gastroesophageal reflux disease   . Gastroparesis   . Hearing loss    mild  . Hyperlipidemia   . Irregular heartbeat   . Ovarian cyst 2008  . Paroxysmal atrial fibrillation ([facility])    Palpitations; maintained on flecainide; -normal coronary angiography and 1999; negative stress nuclear study in 11/2005  . PONV (postoperative nausea and vomiting)   . Tobacco abuse, in remission    10-pack-years; quit in 2001     No Known Allergies   Current Outpatient Medications  Medication Sig Dispense Refill  . apixaban (ELIQUIS) 5 MG TABS tablet Take 1 tablet (5 mg total) by mouth 2 (two) times daily. 60 tablet 3  . Calcium Carb-Cholecalciferol (CALCIUM 1000 + D PO) Take 1 tablet by mouth daily.    . flecainide (TAMBOCOR) 100 MG tablet TAKE ONE TABLET EVERY 12 HOURS 180 tablet 3  . gabapentin (NEURONTIN) 100 MG capsule Take 200 mg by mouth 3 (three) times daily.   2  . HYDROcodone-acetaminophen (NORCO) 10-325 MG tablet Take 1 tablet by mouth every 6 (six) hours as needed for moderate pain.     Marland Kitchen lactulose (CHRONULAC) 10 GM/15ML solution Take 20 g by  mouth at bedtime.    Marland Kitchen MEGARED OMEGA-3 KRILL OIL 500 MG CAPS Take 500 mg by mouth daily.     Marland Kitchen omeprazole (PRILOSEC) 20 MG capsule Take 20 mg by mouth daily before breakfast.     . ranitidine (ZANTAC) 150 MG tablet Take 150 mg by mouth at bedtime.    . rosuvastatin (CRESTOR) 40 MG tablet TAKE ONE (1) TABLET BY MOUTH EVERY DAY 90 tablet 1  . vitamin B-12 (CYANOCOBALAMIN) 1000 MCG tablet Take 1,000 mcg by mouth daily.     No current facility-administered medications for this visit.      Past Surgical History:  Procedure Laterality Date  . ABDOMINAL HYSTERECTOMY  1983   partial  . BIOPSY  10/18/2017   Procedure: BIOPSY;  Surgeon: [name], MD;  Location: [facility];  Service: Endoscopy;;  gastric  . CHOLECYSTECTOMY    . COLON SURGERY  2006   Colon cancer  . COLONOSCOPY  03/25/2012   [name], MD  . COLONOSCOPY N/A 04/26/2017   Procedure: COLONOSCOPY;  Surgeon: [name], MD;  Location: [facility];  Service: Endoscopy;  Laterality: N/A;  2:00  . COSMETIC SURGERY  09/2007  . ESOPHAGOGASTRODUODENOSCOPY N/A 10/18/2017   Procedure: ESOPHAGOGASTRODUODENOSCOPY (EGD);  Surgeon: [name], MD;  Location: [facility];  Service: Endoscopy;  Laterality: N/A;  .  PARTIAL COLECTOMY  2006   adenocarcinoma  . POLYPECTOMY  04/26/2017   Procedure: POLYPECTOMY;  Surgeon: [name], MD;  Location: [facility];  Service: Endoscopy;;  colon  . TONSILLECTOMY       No Known Allergies    Family History  Problem Relation Age of Onset  . Heart disease Mother   . Stroke Mother   . Heart attack Mother   . Cancer Father        lung  . Heart disease Father   . Hypertension Father   . Cancer Brother        rectal  . Healthy Son   . Heart failure Sister   . Stroke Sister      Social History Ms. Christine reports that she quit smoking about 15 years ago. She started smoking about 54 years ago. She has a 12.00 pack-year smoking history. She has never used  smokeless tobacco. Ms. Gupta reports that she does not drink alcohol.   Review of Systems CONSTITUTIONAL: No weight loss, fever, chills, weakness or fatigue.  HEENT: Eyes: No visual loss, blurred vision, double vision or yellow sclerae.No hearing loss, sneezing, congestion, runny nose or sore throat.  SKIN: No rash or itching.  CARDIOVASCULAR: per hpi RESPIRATORY: No shortness of breath, cough or sputum.  GASTROINTESTINAL: No anorexia, nausea, vomiting or diarrhea. No abdominal pain or blood.  GENITOURINARY: No burning on urination, no polyuria NEUROLOGICAL: No headache, dizziness, syncope, paralysis, ataxia, numbness or tingling in the extremities. No change in bowel or bladder control.  MUSCULOSKELETAL: No muscle, back pain, joint pain or stiffness.  LYMPHATICS: No enlarged nodes. No history of splenectomy.  PSYCHIATRIC: No history of depression or anxiety.  ENDOCRINOLOGIC: No reports of sweating, cold or heat intolerance. No polyuria or polydipsia.  Marland Kitchen   Physical Examination Vitals:   11/02/17 1322  BP: 106/64  Pulse: 73  SpO2: 93%   Vitals:   11/02/17 1322  Weight: 110 lb (49.9 kg)  Height: 5\' 2"  (1.575 m)    Gen: resting comfortably, no acute distress HEENT: no scleral icterus, pupils equal round and reactive, no palptable cervical adenopathy,  CV: RRR, no m/r/g, no jvd Resp: Clear to auscultation bilaterally GI: abdomen is soft, non-tender, non-distended, normal bowel sounds, no hepatosplenomegaly MSK: extremities are warm, no edema.  Skin: warm, no rash Neuro:  no focal deficits Psych: appropriate affect   Diagnostic Studies 03/2013 MPI Overall low risk exercise Cardiolite. Equivocal ST segment abnormalities were noted, mild chest pain at peak exertion, otherwise no definite arrhythmias. Perfusion imaging is consistent with breast attenuation affecting the mid to apical anteroseptal wall, no definite ischemic defects. LVEF is 71% with normal volumes and no  wall motion abnormalities.   08/2017 Lexiscan nuclear stress  There was no ST segment deviation noted during stress.  The study is normal. There are no perfusion defects consistent with prior infarct or current ischemia.  This is a low risk study.  The left ventricular ejection fraction is normal (55-65%).    Assessment and Plan  1. Afib  -CHADS2VASC score is 2, continue antioag -doing well ,continue current meds  2. Atypical chest pain - recent symptoms resolved, recent stress test without ischemia - continue to moniotor.   3. Hyperlipidemia - continue statin   F/u 6 months       Arnoldo Lenis, M.D.

## 2017-11-02 NOTE — Patient Instructions (Signed)
Medication Instructions:  Your physician recommends that you continue on your current medications as directed. Please refer to the Current Medication list given to you today.  Labwork: NONE  Testing/Procedures: NONE  Follow-Up: Your physician wants you to follow-up in: 6 MONTHS WITH DR. BRANCH. You will receive a reminder letter in the mail two months in advance. If you don't receive a letter, please call our office to schedule the follow-up appointment.  Any Other Special Instructions Will Be Listed Below (If Applicable).  If you need a refill on your cardiac medications before your next appointment, please call your pharmacy. 

## 2017-11-05 ENCOUNTER — Telehealth: Payer: Self-pay

## 2017-11-05 NOTE — Telephone Encounter (Signed)
Called Pt, LMOVM advising holter looks good and to call with any questions

## 2017-11-05 NOTE — Telephone Encounter (Signed)
-----   Message from [place], DO sent at 11/05/2017  3:54 PM EDT ----- You can let pt know that holter monitor looks good.

## 2017-11-10 ENCOUNTER — Encounter: Payer: Self-pay | Admitting: Cardiology

## 2017-12-07 DIAGNOSIS — M25511 Pain in right shoulder: Secondary | ICD-10-CM | POA: Diagnosis not present

## 2017-12-09 DIAGNOSIS — H25813 Combined forms of age-related cataract, bilateral: Secondary | ICD-10-CM | POA: Diagnosis not present

## 2017-12-13 ENCOUNTER — Other Ambulatory Visit: Payer: Self-pay

## 2018-01-04 DIAGNOSIS — M47816 Spondylosis without myelopathy or radiculopathy, lumbar region: Secondary | ICD-10-CM | POA: Diagnosis not present

## 2018-01-05 ENCOUNTER — Telehealth: Payer: Self-pay | Admitting: Family Medicine

## 2018-01-05 NOTE — Telephone Encounter (Signed)
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

## 2018-01-05 NOTE — Telephone Encounter (Signed)
Last labs hepatic, bmp, cbc, lipase, vit b12, tsh, CEA on 09/03/17

## 2018-01-05 NOTE — Telephone Encounter (Signed)
Patient scheduled a physical for October 4th and would like to have an order put in to have labwork done prior.

## 2018-01-06 ENCOUNTER — Ambulatory Visit (INDEPENDENT_AMBULATORY_CARE_PROVIDER_SITE_OTHER): Payer: Medicare Other | Admitting: Otolaryngology

## 2018-01-06 ENCOUNTER — Other Ambulatory Visit: Payer: Self-pay | Admitting: Family Medicine

## 2018-01-06 DIAGNOSIS — H7202 Central perforation of tympanic membrane, left ear: Secondary | ICD-10-CM

## 2018-01-06 DIAGNOSIS — H6983 Other specified disorders of Eustachian tube, bilateral: Secondary | ICD-10-CM

## 2018-01-06 DIAGNOSIS — H9312 Tinnitus, left ear: Secondary | ICD-10-CM | POA: Diagnosis not present

## 2018-01-06 DIAGNOSIS — E785 Hyperlipidemia, unspecified: Secondary | ICD-10-CM

## 2018-01-06 DIAGNOSIS — H903 Sensorineural hearing loss, bilateral: Secondary | ICD-10-CM | POA: Diagnosis not present

## 2018-01-06 DIAGNOSIS — Z79899 Other long term (current) drug therapy: Secondary | ICD-10-CM

## 2018-01-06 DIAGNOSIS — D531 Other megaloblastic anemias, not elsewhere classified: Secondary | ICD-10-CM

## 2018-01-06 NOTE — Telephone Encounter (Signed)
Labs orders placed. Left message to return call

## 2018-01-06 NOTE — Telephone Encounter (Signed)
Pt returned call and was informed that lab orders were placed. Pt verbalized understanding.

## 2018-01-18 ENCOUNTER — Other Ambulatory Visit: Payer: Self-pay | Admitting: Cardiology

## 2018-01-20 ENCOUNTER — Other Ambulatory Visit: Payer: Self-pay | Admitting: Family Medicine

## 2018-02-08 DIAGNOSIS — Z79899 Other long term (current) drug therapy: Secondary | ICD-10-CM | POA: Diagnosis not present

## 2018-02-08 DIAGNOSIS — D531 Other megaloblastic anemias, not elsewhere classified: Secondary | ICD-10-CM | POA: Diagnosis not present

## 2018-02-08 DIAGNOSIS — E785 Hyperlipidemia, unspecified: Secondary | ICD-10-CM | POA: Diagnosis not present

## 2018-02-09 LAB — BASIC METABOLIC PANEL
BUN/Creatinine Ratio: 16 (ref 12–28)
BUN: 16 mg/dL (ref 8–27)
CALCIUM: 9.7 mg/dL (ref 8.7–10.3)
CO2: 24 mmol/L (ref 20–29)
Chloride: 106 mmol/L (ref 96–106)
Creatinine, Ser: 1 mg/dL (ref 0.57–1.00)
GFR calc Af Amer: 64 mL/min/{1.73_m2} (ref >59)
GFR calc non Af Amer: 56 mL/min/{1.73_m2} — ABNORMAL LOW (ref >59)
GLUCOSE: 96 mg/dL (ref 65–99)
Potassium: 4.5 mmol/L (ref 3.5–5.2)
SODIUM: 145 mmol/L — AB (ref 134–144)

## 2018-02-09 LAB — CBC WITH DIFFERENTIAL/PLATELET
BASOS ABS: 0 10*3/uL (ref 0.0–0.2)
BASOS: 0 %
EOS (ABSOLUTE): 0.1 10*3/uL (ref 0.0–0.4)
Eos: 2 %
Hematocrit: 37 % (ref 34.0–46.6)
Hemoglobin: 12.3 g/dL (ref 11.1–15.9)
Immature Grans (Abs): 0 10*3/uL (ref 0.0–0.1)
Immature Granulocytes: 0 %
Lymphocytes Absolute: 3.5 10*3/uL — ABNORMAL HIGH (ref 0.7–3.1)
Lymphs: 42 %
MCH: 33.1 pg — AB (ref 26.6–33.0)
MCHC: 33.2 g/dL (ref 31.5–35.7)
MCV: 100 fL — ABNORMAL HIGH (ref 79–97)
MONOS ABS: 0.7 10*3/uL (ref 0.1–0.9)
Monocytes: 8 %
NEUTROS PCT: 48 %
Neutrophils Absolute: 3.9 10*3/uL (ref 1.4–7.0)
PLATELETS: 283 10*3/uL (ref 150–450)
RBC: 3.72 x10E6/uL — ABNORMAL LOW (ref 3.77–5.28)
RDW: 12.2 % — AB (ref 12.3–15.4)
WBC: 8.3 10*3/uL (ref 3.4–10.8)

## 2018-02-09 LAB — HEPATIC FUNCTION PANEL
ALBUMIN: 4.4 g/dL (ref 3.5–4.8)
ALT: 15 IU/L (ref 0–32)
AST: 14 IU/L (ref 0–40)
Alkaline Phosphatase: 61 IU/L (ref 39–117)
BILIRUBIN TOTAL: 0.3 mg/dL (ref 0.0–1.2)
Bilirubin, Direct: 0.08 mg/dL (ref 0.00–0.40)
TOTAL PROTEIN: 6.4 g/dL (ref 6.0–8.5)

## 2018-02-09 LAB — LIPID PANEL
CHOL/HDL RATIO: 2.5 ratio (ref 0.0–4.4)
Cholesterol, Total: 171 mg/dL (ref 100–199)
HDL: 68 mg/dL (ref >39)
LDL CALC: 70 mg/dL (ref 0–99)
TRIGLYCERIDES: 164 mg/dL — AB (ref 0–149)
VLDL Cholesterol Cal: 33 mg/dL (ref 5–40)

## 2018-02-09 LAB — VITAMIN B12: VITAMIN B 12: 1203 pg/mL (ref 232–1245)

## 2018-02-11 ENCOUNTER — Encounter: Payer: Medicare Other | Admitting: Family Medicine

## 2018-02-15 ENCOUNTER — Encounter: Payer: Self-pay | Admitting: Family Medicine

## 2018-02-15 ENCOUNTER — Ambulatory Visit (INDEPENDENT_AMBULATORY_CARE_PROVIDER_SITE_OTHER): Payer: Medicare Other | Admitting: Family Medicine

## 2018-02-15 ENCOUNTER — Ambulatory Visit (HOSPITAL_COMMUNITY)
Admission: RE | Admit: 2018-02-15 | Discharge: 2018-02-15 | Disposition: A | Discharge status: Home / Self Care | Payer: Medicare Other | Source: Ambulatory Visit | Attending: Family Medicine | Admitting: Family Medicine

## 2018-02-15 VITALS — Ht 63.0 in | Wt 106.4 lb

## 2018-02-15 DIAGNOSIS — Z0000 Encounter for general adult medical examination without abnormal findings: Secondary | ICD-10-CM

## 2018-02-15 DIAGNOSIS — R634 Abnormal weight loss: Secondary | ICD-10-CM | POA: Insufficient documentation

## 2018-02-15 DIAGNOSIS — Z23 Encounter for immunization: Secondary | ICD-10-CM | POA: Diagnosis not present

## 2018-02-15 DIAGNOSIS — R079 Chest pain, unspecified: Secondary | ICD-10-CM | POA: Diagnosis not present

## 2018-02-15 DIAGNOSIS — E785 Hyperlipidemia, unspecified: Secondary | ICD-10-CM

## 2018-02-15 NOTE — Progress Notes (Signed)
Subjective:    Patient ID: [name], female    DOB: 11/30/1940, 77 y.o.   MRN: [id number]  HPI AWV- Annual Wellness Visit Very nice patient She brings up multiple different concerns She relates she wonders why she should not get a Pap smear because she is concerned about the possibility of not being able to detect cancer She also states that she has lost weight and she is alarmed by this We reviewed over her weight today We also reviewed over her nutritional status she eats a small breakfast and eats a meal at suppertime and does not eat in between We talked about increasing the amount of calories She denies rectal bleeding dysphagia shortness of breath chest pressure she has had some weight loss she quit smoking 2003 and denies any hemoptysis or significant cough  The patient was seen for their annual wellness visit. The patient's past medical history, surgical history, and family history were reviewed. Pertinent vaccines were reviewed ( tetanus, pneumonia, shingles, flu) The patient's medication list was reviewed and updated.  The height and weight were entered.  BMI recorded in electronic record elsewhere  Cognitive screening was completed. Outcome of Mini - Cog: pass   Falls /depression screening electronically recorded within record elsewhere  Current tobacco usage: none (All patients who use tobacco were given written and verbal information on quitting)  Recent listing of emergency department/hospitalizations over the past year were reviewed.  current specialist the patient sees on a regular basis: Dr. [name] in [place] for back and foot issue.    Medicare annual wellness visit patient questionnaire was reviewed.  A written screening schedule for the patient for the next 5-10 years was given. Appropriate discussion of followup regarding next visit was discussed.   Concerns about weight loss and nausea. Nausea not every day but more days than not. Started about one  month or so ago.   Dizziness started about one month ago.   Flu vaccine today.    Review of Systems  Constitutional: Negative for activity change, appetite change and fatigue.  HENT: Negative for congestion and rhinorrhea.   Respiratory: Negative for cough and shortness of breath.   Cardiovascular: Negative for chest pain and leg swelling.  Gastrointestinal: Negative for abdominal pain and diarrhea.  Endocrine: Negative for polydipsia and polyphagia.  Skin: Negative for color change.  Neurological: Positive for dizziness. Negative for weakness.  Psychiatric/Behavioral: Negative for behavioral problems and confusion.       Objective:   Physical Exam  Constitutional: She appears well-nourished. No distress.  HENT:  Head: Normocephalic and atraumatic.  Eyes: Right eye exhibits no discharge. Left eye exhibits no discharge.  Neck: No tracheal deviation present.  Cardiovascular: Normal rate, regular rhythm and normal heart sounds.  No murmur heard. Pulmonary/Chest: Effort normal and breath sounds normal. No respiratory distress.  Musculoskeletal: She exhibits no edema.  Lymphadenopathy:    She has no cervical adenopathy.  Neurological: She is alert. Coordination normal.  Skin: Skin is warm and dry.  Psychiatric: She has a normal mood and affect. Her behavior is normal.  Vitals reviewed.  Breast exam normal bilateral Pelvic exam normal no masses felt previous hysterectomy Pap smear not indicated       Assessment & Plan:  Do colonoscopy in December  Adult wellness-complete.wellness physical was conducted today. Importance of diet and exercise were discussed in detail.  In addition to this a discussion regarding safety was also covered. We also reviewed over immunizations and gave recommendations regarding  current immunization needed for age.  In addition to this additional areas were also touched on including: Preventative health exams needed:  Colonoscopy Dec  2019  Patient was advised yearly wellness exam  The patient is concerned about weight loss Her weight was 111 pounds last year 108 pounds earlier this spring 106 pounds this summer Currently 105 pounds Unfortunately the patient does not eat enough calories I encouraged her to increase amount of protein with her breakfast and also eat a midday meal/snack  The patient was seen today as part of an evaluation regarding hyperlipidemia.  Recent lab work has been reviewed with the patient as well as the goals for good cholesterol care.  In addition to this medications have been discussed the importance of compliance with diet and medications discussed as well.  Finally the patient is aware that poor control of cholesterol, noncompliance can dramatically increase the risk of complications. The patient will keep regular office visits and the patient does agreed to periodic lab work.  She is concerned about the possibility of lung cancer she states an occasional cough she is worried about the weight loss we will do a chest x-ray she is outside the recommended ages for CAT scan of the lungs or a preventative measure  She also is due for a follow-up colonoscopy in December we will send notification to gastroenterology regarding this

## 2018-02-15 NOTE — Patient Instructions (Addendum)
Increase your calories  Start having greek yogurt or another source of protein in the morning  Begin having a mid day snack Such as [place] with cheese   Results for orders placed or performed in visit on 27/06/23  Basic Metabolic Panel (BMET)  Result Value Ref Range   Glucose 96 65 - 99 mg/dL   BUN 16 8 - 27 mg/dL   Creatinine, Ser 1.00 0.57 - 1.00 mg/dL   GFR calc non Af Amer 56 (L) >59 mL/min/1.73   GFR calc Af Amer 64 >59 mL/min/1.73   BUN/Creatinine Ratio 16 12 - 28   Sodium 145 (H) 134 - 144 mmol/L   Potassium 4.5 3.5 - 5.2 mmol/L   Chloride 106 96 - 106 mmol/L   CO2 24 20 - 29 mmol/L   Calcium 9.7 8.7 - 10.3 mg/dL  Hepatic function panel  Result Value Ref Range   Total Protein 6.4 6.0 - 8.5 g/dL   Albumin 4.4 3.5 - 4.8 g/dL   Bilirubin Total 0.3 0.0 - 1.2 mg/dL   Bilirubin, Direct 0.08 0.00 - 0.40 mg/dL   Alkaline Phosphatase 61 39 - 117 IU/L   AST 14 0 - 40 IU/L   ALT 15 0 - 32 IU/L  Lipid Profile  Result Value Ref Range   Cholesterol, Total 171 100 - 199 mg/dL   Triglycerides 164 (H) 0 - 149 mg/dL   HDL 68 >39 mg/dL   VLDL Cholesterol Cal 33 5 - 40 mg/dL   LDL Calculated 70 0 - 99 mg/dL   Chol/HDL Ratio 2.5 0.0 - 4.4 ratio  B12  Result Value Ref Range   Vitamin B-12 1,203 232 - 1,245 pg/mL  CBC with Differential  Result Value Ref Range   WBC 8.3 3.4 - 10.8 x10E3/uL   RBC 3.72 (L) 3.77 - 5.28 x10E6/uL   Hemoglobin 12.3 11.1 - 15.9 g/dL   Hematocrit 37.0 34.0 - 46.6 %   MCV 100 (H) 79 - 97 fL   MCH 33.1 (H) 26.6 - 33.0 pg   MCHC 33.2 31.5 - 35.7 g/dL   RDW 12.2 (L) 12.3 - 15.4 %   Platelets 283 150 - 450 x10E3/uL   Neutrophils 48 Not Estab. %   Lymphs 42 Not Estab. %   Monocytes 8 Not Estab. %   Eos 2 Not Estab. %   Basos 0 Not Estab. %   Neutrophils Absolute 3.9 1.4 - 7.0 x10E3/uL   Lymphocytes Absolute 3.5 (H) 0.7 - 3.1 x10E3/uL   Monocytes Absolute 0.7 0.1 - 0.9 x10E3/uL   EOS (ABSOLUTE) 0.1 0.0 - 0.4 x10E3/uL   Basophils Absolute 0.0 0.0 - 0.2  x10E3/uL   Immature Granulocytes 0 Not Estab. %   Immature Grans (Abs) 0.0 0.0 - 0.1 x10E3/uL

## 2018-03-29 DIAGNOSIS — M47816 Spondylosis without myelopathy or radiculopathy, lumbar region: Secondary | ICD-10-CM | POA: Diagnosis not present

## 2018-04-12 ENCOUNTER — Encounter (INDEPENDENT_AMBULATORY_CARE_PROVIDER_SITE_OTHER): Payer: Self-pay | Admitting: *Deleted

## 2018-04-12 DIAGNOSIS — M25511 Pain in right shoulder: Secondary | ICD-10-CM | POA: Diagnosis not present

## 2018-04-18 ENCOUNTER — Other Ambulatory Visit: Payer: Self-pay | Admitting: Family Medicine

## 2018-04-27 ENCOUNTER — Other Ambulatory Visit (INDEPENDENT_AMBULATORY_CARE_PROVIDER_SITE_OTHER): Payer: Self-pay | Admitting: *Deleted

## 2018-04-27 DIAGNOSIS — Z800 Family history of malignant neoplasm of digestive organs: Secondary | ICD-10-CM | POA: Insufficient documentation

## 2018-04-27 DIAGNOSIS — Z860100 Personal history of colon polyps, unspecified: Secondary | ICD-10-CM | POA: Insufficient documentation

## 2018-04-27 DIAGNOSIS — Z85038 Personal history of other malignant neoplasm of large intestine: Secondary | ICD-10-CM | POA: Insufficient documentation

## 2018-05-16 ENCOUNTER — Telehealth (INDEPENDENT_AMBULATORY_CARE_PROVIDER_SITE_OTHER): Payer: Self-pay | Admitting: *Deleted

## 2018-05-16 ENCOUNTER — Encounter (INDEPENDENT_AMBULATORY_CARE_PROVIDER_SITE_OTHER): Payer: Self-pay | Admitting: *Deleted

## 2018-05-16 NOTE — Telephone Encounter (Signed)
Patient needs trilyte 

## 2018-05-16 NOTE — Telephone Encounter (Signed)
Patient is scheduled for colonoscopy 07/06/18 -- needs to stop Eliquis 2 days prior -- please advise if ok to stop

## 2018-05-17 ENCOUNTER — Ambulatory Visit (INDEPENDENT_AMBULATORY_CARE_PROVIDER_SITE_OTHER): Payer: Medicare Other | Admitting: Cardiology

## 2018-05-17 ENCOUNTER — Encounter: Payer: Self-pay | Admitting: Cardiology

## 2018-05-17 VITALS — BP 120/64 | HR 66 | Ht 63.0 in | Wt 103.0 lb

## 2018-05-17 DIAGNOSIS — E782 Mixed hyperlipidemia: Secondary | ICD-10-CM | POA: Diagnosis not present

## 2018-05-17 DIAGNOSIS — I4891 Unspecified atrial fibrillation: Secondary | ICD-10-CM

## 2018-05-17 MED ORDER — PEG 3350-KCL-NA BICARB-NACL 420 G PO SOLR: 4000.0000 mL | Freq: Once | ORAL | 0 refills | Status: AC

## 2018-05-17 NOTE — Progress Notes (Signed)
Clinical Summary [name] is a 78 y.o.female   1. Afib  - has done well on flecanidefor several years    - no palpitations, no bleeding on eliquis.   2. Atypical chest pain - long history, prior ischemic testing has been negative  - seen 08/2017 by PA Strader for atypical chest pain. - 08/2017 nuclear stress negative.  - no recurrent symptoms.   3. Hyperlipidemia - remains compliant with statin - 02/2018 TC 171 TG 164 HDL 68 LDL 70  4. Weight loss - followed by pcp and GI    SH: husband with 3 surgeries recently. She has been helping at home. Sister passed in November.   Past Medical History:  Diagnosis Date  . Adenocarcinoma, colon ([facility]) 03/2005   Stage I; resected in 03/2005  . Arthritis   . Gastroesophageal reflux disease   . Gastroparesis   . Hearing loss    mild  . Hyperlipidemia   . Irregular heartbeat   . Ovarian cyst 2008  . Paroxysmal atrial fibrillation ([facility])    Palpitations; maintained on flecainide; -normal coronary angiography and 1999; negative stress nuclear study in 11/2005  . PONV (postoperative nausea and vomiting)   . Tobacco abuse, in remission    10-pack-years; quit in 2001     No Known Allergies   Current Outpatient Medications  Medication Sig Dispense Refill  . apixaban (ELIQUIS) 5 MG TABS tablet Take 1 tablet (5 mg total) by mouth 2 (two) times daily. 60 tablet 3  . Calcium Carb-Cholecalciferol (CALCIUM 1000 + D PO) Take 1 tablet by mouth daily.    Marland Kitchen ELIQUIS 5 MG TABS tablet TAKE ONE TABLET BY MOUTH TWICE A DAY (Patient not taking: Reported on 02/15/2018) 60 tablet 3  . flecainide (TAMBOCOR) 100 MG tablet TAKE ONE TABLET EVERY 12 HOURS 180 tablet 3  . fluticasone (FLONASE) 50 MCG/ACT nasal spray USE TWO SPRAYS IN EACH NOSTRIL DAILY (Patient not taking: Reported on 02/15/2018) 16 g 5  . gabapentin (NEURONTIN) 100 MG capsule Take 200 mg by mouth 3 (three) times daily.   2  . HYDROcodone-acetaminophen (NORCO) 10-325 MG  tablet Take 1 tablet by mouth every 6 (six) hours as needed for moderate pain.     Marland Kitchen lactulose (CHRONULAC) 10 GM/15ML solution Take 20 g by mouth at bedtime.    Marland Kitchen MEGARED OMEGA-3 KRILL OIL 500 MG CAPS Take 500 mg by mouth daily.     Marland Kitchen omeprazole (PRILOSEC) 20 MG capsule Take 20 mg by mouth daily before breakfast.     . polyethylene glycol-electrolytes (NULYTELY/GOLYTELY) 420 g solution Take 4,000 mLs by mouth once for 1 dose. 4000 mL 0  . ranitidine (ZANTAC) 150 MG tablet Take 150 mg by mouth at bedtime.    . rosuvastatin (CRESTOR) 40 MG tablet TAKE ONE (1) TABLET BY MOUTH EVERY DAY 90 tablet 1  . vitamin B-12 (CYANOCOBALAMIN) 1000 MCG tablet Take 1,000 mcg by mouth daily.     No current facility-administered medications for this visit.      Past Surgical History:  Procedure Laterality Date  . ABDOMINAL HYSTERECTOMY  1983   partial  . BIOPSY  10/18/2017   Procedure: BIOPSY;  Surgeon: [name], MD;  [facility]: [facility];  Service: Endoscopy;;  gastric  . CHOLECYSTECTOMY    . COLON SURGERY  2006   Colon cancer  . COLONOSCOPY  03/25/2012   [name], MD  . COLONOSCOPY N/A 04/26/2017   Procedure: COLONOSCOPY;  Surgeon: Laural Golden,  Mechele Dawley, MD;  Location: [facility];  Service: Endoscopy;  Laterality: N/A;  2:00  . COSMETIC SURGERY  09/2007  . ESOPHAGOGASTRODUODENOSCOPY N/A 10/18/2017   Procedure: ESOPHAGOGASTRODUODENOSCOPY (EGD);  Surgeon: [name], MD;  [facility]: [facility];  Service: Endoscopy;  Laterality: N/A;  . PARTIAL COLECTOMY  2006   adenocarcinoma  . POLYPECTOMY  04/26/2017   Procedure: POLYPECTOMY;  Surgeon: [name], MD;  Location: [facility];  Service: Endoscopy;;  colon  . TONSILLECTOMY       No Known Allergies    Family History  Problem Relation Age of Onset  . Heart disease Mother   . Stroke Mother   . Heart attack Mother   . Cancer Father        lung  . Heart disease Father   . Hypertension Father   . Cancer Brother         rectal  . Healthy Son   . Heart failure Sister   . Stroke Sister      Social History Ms. Schnapp reports that she quit smoking about 16 years ago. She started smoking about 55 years ago. She has a 12.00 pack-year smoking history. She has never used smokeless tobacco. Ms. Sykora reports no history of alcohol use.   Review of Systems CONSTITUTIONAL: No weight loss, fever, chills, weakness or fatigue.  HEENT: Eyes: No visual loss, blurred vision, double vision or yellow sclerae.No hearing loss, sneezing, congestion, runny nose or sore throat.  SKIN: No rash or itching.  CARDIOVASCULAR: per hpi RESPIRATORY: No shortness of breath, cough or sputum.  GASTROINTESTINAL: No anorexia, nausea, vomiting or diarrhea. No abdominal pain or blood.  GENITOURINARY: No burning on urination, no polyuria NEUROLOGICAL: No headache, dizziness, syncope, paralysis, ataxia, numbness or tingling in the extremities. No change in bowel or bladder control.  MUSCULOSKELETAL: No muscle, back pain, joint pain or stiffness.  LYMPHATICS: No enlarged nodes. No history of splenectomy.  PSYCHIATRIC: No history of depression or anxiety.  ENDOCRINOLOGIC: No reports of sweating, cold or heat intolerance. No polyuria or polydipsia.  Marland Kitchen   Physical Examination Vitals:   05/17/18 1400  BP: 120/64  Pulse: 66  SpO2: 98%   Vitals:   05/17/18 1400  Weight: 103 lb (46.7 kg)  Height: 5\' 3"  (1.6 m)    Gen: resting comfortably, no acute distress HEENT: no scleral icterus, pupils equal round and reactive, no palptable cervical adenopathy,  CV: RRR, no m/r/g, no jvd Resp: Clear to auscultation bilaterally GI: abdomen is soft, non-tender, non-distended, normal bowel sounds, no hepatosplenomegaly MSK: extremities are warm, no edema.  Skin: warm, no rash Neuro:  no focal deficits Psych: appropriate affect   Diagnostic Studies 03/2013 MPI Overall low risk exercise Cardiolite. Equivocal ST segment abnormalities were  noted, mild chest pain at peak exertion, otherwise no definite arrhythmias. Perfusion imaging is consistent with breast attenuation affecting the mid to apical anteroseptal wall, no definite ischemic defects. LVEF is 71% with normal volumes and no wall motion abnormalities.   08/2017 Lexiscan nuclear stress  There was no ST segment deviation noted during stress.  The study is normal. There are no perfusion defects consistent with prior infarct or current ischemia.  This is a low risk study.  The left ventricular ejection fraction is normal (55-65%).    Assessment and Plan  1. Afib  -no recent symptoms, continue current meds including anticoag  2. Atypical chest pain - long history, recent stress without ischemia  continue to monitor  3. Hyperlipidemia -LDL at goal. Continue dietary modification for high TGs   F/u 6 months   Arnoldo Lenis, M.D.

## 2018-05-17 NOTE — Telephone Encounter (Signed)
Patient aware.

## 2018-05-17 NOTE — Telephone Encounter (Signed)
Patient with diagnosis of Afib on Eliquis for anticoagulation.    Procedure: colonoscopy Date of procedure: 07/06/18  CHADS2-VASc score of  4 (CHF, HTN, AGE, PreDM, stroke/tia x 2, CAD, AGE, female)  CrCl 73ml/min  Per office protocol, patient can hold Eliquis for 2 days prior to procedure.

## 2018-05-17 NOTE — Patient Instructions (Signed)

## 2018-05-18 ENCOUNTER — Other Ambulatory Visit: Payer: Self-pay | Admitting: Cardiology

## 2018-05-19 ENCOUNTER — Ambulatory Visit: Payer: Medicare Other | Admitting: Family Medicine

## 2018-05-23 ENCOUNTER — Ambulatory Visit (INDEPENDENT_AMBULATORY_CARE_PROVIDER_SITE_OTHER): Payer: Medicare Other | Admitting: Family Medicine

## 2018-05-23 VITALS — BP 122/84 | Ht 63.0 in | Wt 102.8 lb

## 2018-05-23 DIAGNOSIS — R634 Abnormal weight loss: Secondary | ICD-10-CM | POA: Diagnosis not present

## 2018-05-23 DIAGNOSIS — R5383 Other fatigue: Secondary | ICD-10-CM | POA: Diagnosis not present

## 2018-05-23 DIAGNOSIS — R110 Nausea: Secondary | ICD-10-CM

## 2018-05-23 DIAGNOSIS — R1031 Right lower quadrant pain: Secondary | ICD-10-CM | POA: Diagnosis not present

## 2018-05-23 DIAGNOSIS — G8929 Other chronic pain: Secondary | ICD-10-CM | POA: Diagnosis not present

## 2018-05-23 DIAGNOSIS — C189 Malignant neoplasm of colon, unspecified: Secondary | ICD-10-CM | POA: Diagnosis not present

## 2018-05-23 NOTE — Progress Notes (Signed)
Subjective:    Patient ID: [name], female    DOB: 15-Jul-1940, 78 y.o.   MRN: [id number]  HPI  Patient arrives to discuss continued weight loss. Patient states she stays nauseated and has little energy. Patient relates she has low energy she states throughout the day she makes herself to stop.  She also describes persistent nausea but denies excessive thirst or urination She denies shortness of breath chest tightness pressure pain denies hemoptysis.  Denies night sweats.  She is concerned about weight loss she states she used to be around 114 215 pounds and she is noted over the past year she is progressively gone down Earlier this spring she was 108 in the summer/early fall she was near 60 Now she is 102  She typically eats a breakfast but often eats a small breakfast she eats a snack in the middle of the day and she eats dinner at suppertime but she states that she feels she is eating enough and eating the way she is always ate before so she does not feel that that is the issue  She does have a history of colon cancer from 2006 she also has a history of COPD although not severe and does not require inhalers and she does have an underlying history of heart disease  She is worried that she has reoccurrence of cancer  She does state how she has had abdominal discomfort intermittently in the right lower quadrant no guarding or rebound with this discomfort the discomfort typically lasts a few minutes at a time will happen several times per week over the past several weeks  Review of Systems  Constitutional: Positive for fatigue and unexpected weight change. Negative for activity change and appetite change.  HENT: Negative for congestion and rhinorrhea.   Respiratory: Negative for cough and shortness of breath.   Cardiovascular: Negative for chest pain and leg swelling.  Gastrointestinal: Positive for abdominal pain and nausea. Negative for blood in stool, constipation and  diarrhea.  Endocrine: Negative for polydipsia and polyphagia.  Skin: Negative for color change.  Neurological: Negative for dizziness and weakness.  Psychiatric/Behavioral: Negative for behavioral problems and confusion.       Objective:   Physical Exam Vitals signs reviewed.  Constitutional:      General: She is not in acute distress. HENT:     Head: Normocephalic and atraumatic.  Eyes:     General:        Right eye: No discharge.        Left eye: No discharge.  Neck:     Trachea: No tracheal deviation.  Cardiovascular:     Rate and Rhythm: Normal rate and regular rhythm.     Heart sounds: Normal heart sounds. No murmur.  Pulmonary:     Effort: Pulmonary effort is normal. No respiratory distress.     Breath sounds: Normal breath sounds.  Lymphadenopathy:     Cervical: No cervical adenopathy.  Skin:    General: Skin is warm and dry.  Neurological:     Mental Status: She is alert.     Coordination: Coordination normal.  Psychiatric:        Behavior: Behavior normal.     Patient does have some bruising in her skin related to the Eliquis      Assessment & Plan:  Weight loss Patient has not had dramatic weight loss but it has been steady weight loss This could be related into not getting enough calories or could possibly be  related to age and COPD and but certainly could be a sign of underlying illness I reviewed over the lab work that we did back in April as well as October plus also reviewed over the x-ray results  Based on her weight loss as well as fatigue and abdominal discomfort we will do a CT scan of the abdomen and pelvis Also will go forward with additional lab work plus also consultation with oncology because of weight loss and fear of reoccurrence of her cancer  We may end up setting her up with endocrinology as well to rule out adrenal gland insufficiency but at this point time hold off on this  25 minutes was spent with the patient.  This statement  verifies that 25 minutes was indeed spent with the patient.  More than 50% of this visit-total duration of the visit-was spent in counseling and coordination of care. The issues that the patient came in for today as reflected in the diagnosis (s) please refer to documentation for further details.

## 2018-05-24 ENCOUNTER — Telehealth: Payer: Self-pay | Admitting: Family Medicine

## 2018-05-24 LAB — CBC WITH DIFFERENTIAL/PLATELET
BASOS: 0 %
Basophils Absolute: 0 10*3/uL (ref 0.0–0.2)
EOS (ABSOLUTE): 0.2 10*3/uL (ref 0.0–0.4)
Eos: 3 %
HEMATOCRIT: 37.7 % (ref 34.0–46.6)
Hemoglobin: 12.6 g/dL (ref 11.1–15.9)
Immature Grans (Abs): 0 10*3/uL (ref 0.0–0.1)
Immature Granulocytes: 0 %
Lymphocytes Absolute: 1.9 10*3/uL (ref 0.7–3.1)
Lymphs: 24 %
MCH: 33.8 pg — ABNORMAL HIGH (ref 26.6–33.0)
MCHC: 33.4 g/dL (ref 31.5–35.7)
MCV: 101 fL — AB (ref 79–97)
Monocytes Absolute: 0.6 10*3/uL (ref 0.1–0.9)
Monocytes: 7 %
Neutrophils Absolute: 5 10*3/uL (ref 1.4–7.0)
Neutrophils: 66 %
Platelets: 294 10*3/uL (ref 150–450)
RBC: 3.73 x10E6/uL — ABNORMAL LOW (ref 3.77–5.28)
RDW: 11.8 % (ref 11.7–15.4)
WBC: 7.7 10*3/uL (ref 3.4–10.8)

## 2018-05-24 LAB — BASIC METABOLIC PANEL
BUN/Creatinine Ratio: 27 (ref 12–28)
BUN: 24 mg/dL (ref 8–27)
CO2: 22 mmol/L (ref 20–29)
Calcium: 9.7 mg/dL (ref 8.7–10.3)
Chloride: 105 mmol/L (ref 96–106)
Creatinine, Ser: 0.9 mg/dL (ref 0.57–1.00)
GFR calc Af Amer: 72 mL/min/{1.73_m2} (ref >59)
GFR, EST NON AFRICAN AMERICAN: 63 mL/min/{1.73_m2} (ref >59)
Glucose: 99 mg/dL (ref 65–99)
Potassium: 4.3 mmol/L (ref 3.5–5.2)
Sodium: 142 mmol/L (ref 134–144)

## 2018-05-24 LAB — TSH: TSH: 1 u[IU]/mL (ref 0.450–4.500)

## 2018-05-24 LAB — T4, FREE: Free T4: 1.32 ng/dL (ref 0.82–1.77)

## 2018-05-24 LAB — CORTISOL: Cortisol: 7.7 ug/dL

## 2018-05-24 NOTE — Telephone Encounter (Signed)
The patient is being referred to the cancer center for evaluation of abnormal weight loss with a history of colon cancer  Patient has a CT scan scheduled on the 29th When pursuing her appointment it would be best for her appointment to occur after this CAT scan at least by a couple days

## 2018-05-25 NOTE — Telephone Encounter (Signed)
Please advise. Thank you

## 2018-05-30 ENCOUNTER — Other Ambulatory Visit (HOSPITAL_COMMUNITY): Payer: Self-pay | Admitting: Family Medicine

## 2018-05-30 DIAGNOSIS — Z1231 Encounter for screening mammogram for malignant neoplasm of breast: Secondary | ICD-10-CM

## 2018-06-02 ENCOUNTER — Encounter: Payer: Self-pay | Admitting: Family Medicine

## 2018-06-07 ENCOUNTER — Telehealth (INDEPENDENT_AMBULATORY_CARE_PROVIDER_SITE_OTHER): Payer: Self-pay | Admitting: *Deleted

## 2018-06-07 NOTE — Telephone Encounter (Signed)
Referring MD/PCP: scott luking   Procedure: tcs  Reason/Indication:  Hx polpys, hx colon ca, fam hx colon ca  Has patient had this procedure before?  Yes, 04/2017  If so, when, by whom and where?    Is there a family history of colon cancer?  Yes, brother  Who?  What age when diagnosed?    Is patient diabetic?   no      Does patient have prosthetic heart valve or mechanical valve?  no  Do you have a pacemaker?  no  Has patient ever had endocarditis? no  Has patient had joint replacement within last 12 months?  no  Is patient constipated or do they take laxatives? no  Does patient have a history of alcohol/drug use?  no  Is patient on blood thinner such as Coumadin, Plavix and/or Aspirin? yes  Medications: eliquis 5 mg bid, flecainide 100 mg bid, gabapentin 100 mg daily, rosuvastatin 40 mg daily, ranitidine 150 mg daily, caltrate daily, vit b12 daily, omega red daily  Allergies: nkda  Medication Adjustment per Dr Lindi Adie, NP: eliquis 2 days before  Procedure date & time: 07/06/18 at 1030

## 2018-06-07 NOTE — Telephone Encounter (Signed)
agree

## 2018-06-08 ENCOUNTER — Ambulatory Visit (HOSPITAL_COMMUNITY)
Admission: RE | Admit: 2018-06-08 | Discharge: 2018-06-08 | Disposition: A | Discharge status: Home / Self Care | Payer: Medicare Other | Source: Ambulatory Visit | Attending: Family Medicine | Admitting: Family Medicine

## 2018-06-08 DIAGNOSIS — R634 Abnormal weight loss: Secondary | ICD-10-CM | POA: Diagnosis not present

## 2018-06-08 DIAGNOSIS — K5730 Diverticulosis of large intestine without perforation or abscess without bleeding: Secondary | ICD-10-CM | POA: Diagnosis not present

## 2018-06-08 MED ORDER — IOPAMIDOL (ISOVUE-300) INJECTION 61%
100.0000 mL | Freq: Once | INTRAVENOUS | Status: AC | PRN
  Administered 2018-06-08: 100 mL via INTRAVENOUS

## 2018-06-09 ENCOUNTER — Other Ambulatory Visit: Payer: Self-pay

## 2018-06-09 ENCOUNTER — Other Ambulatory Visit: Payer: Self-pay | Admitting: *Deleted

## 2018-06-09 ENCOUNTER — Inpatient Hospital Stay (HOSPITAL_COMMUNITY): Payer: Medicare Other | Attending: Hematology | Admitting: Hematology

## 2018-06-09 ENCOUNTER — Inpatient Hospital Stay (HOSPITAL_COMMUNITY): Payer: Medicare Other

## 2018-06-09 ENCOUNTER — Encounter (HOSPITAL_COMMUNITY): Payer: Self-pay | Admitting: Hematology

## 2018-06-09 VITALS — BP 114/53 | HR 66 | Temp 97.8°F | Resp 16 | Ht 62.0 in | Wt 103.4 lb

## 2018-06-09 DIAGNOSIS — K3189 Other diseases of stomach and duodenum: Secondary | ICD-10-CM

## 2018-06-09 DIAGNOSIS — R9389 Abnormal findings on diagnostic imaging of other specified body structures: Secondary | ICD-10-CM

## 2018-06-09 DIAGNOSIS — Z87891 Personal history of nicotine dependence: Secondary | ICD-10-CM | POA: Diagnosis not present

## 2018-06-09 DIAGNOSIS — R5383 Other fatigue: Secondary | ICD-10-CM | POA: Diagnosis not present

## 2018-06-09 DIAGNOSIS — Z800 Family history of malignant neoplasm of digestive organs: Secondary | ICD-10-CM | POA: Diagnosis not present

## 2018-06-09 DIAGNOSIS — Z85038 Personal history of other malignant neoplasm of large intestine: Secondary | ICD-10-CM | POA: Diagnosis not present

## 2018-06-09 DIAGNOSIS — K219 Gastro-esophageal reflux disease without esophagitis: Secondary | ICD-10-CM | POA: Insufficient documentation

## 2018-06-09 DIAGNOSIS — R6881 Early satiety: Secondary | ICD-10-CM | POA: Insufficient documentation

## 2018-06-09 DIAGNOSIS — Z801 Family history of malignant neoplasm of trachea, bronchus and lung: Secondary | ICD-10-CM | POA: Diagnosis not present

## 2018-06-09 DIAGNOSIS — I480 Paroxysmal atrial fibrillation: Secondary | ICD-10-CM | POA: Insufficient documentation

## 2018-06-09 DIAGNOSIS — Z7901 Long term (current) use of anticoagulants: Secondary | ICD-10-CM | POA: Diagnosis not present

## 2018-06-09 DIAGNOSIS — E785 Hyperlipidemia, unspecified: Secondary | ICD-10-CM | POA: Diagnosis not present

## 2018-06-09 DIAGNOSIS — Z79899 Other long term (current) drug therapy: Secondary | ICD-10-CM | POA: Insufficient documentation

## 2018-06-09 DIAGNOSIS — R110 Nausea: Secondary | ICD-10-CM | POA: Diagnosis not present

## 2018-06-09 DIAGNOSIS — R634 Abnormal weight loss: Secondary | ICD-10-CM | POA: Insufficient documentation

## 2018-06-09 DIAGNOSIS — Z634 Disappearance and death of family member: Secondary | ICD-10-CM | POA: Diagnosis not present

## 2018-06-09 DIAGNOSIS — C187 Malignant neoplasm of sigmoid colon: Secondary | ICD-10-CM

## 2018-06-09 NOTE — Patient Instructions (Signed)
[facility] at [facility] Discharge Instructions     Thank you for choosing [facility] at [facility] to provide your oncology and hematology care.  To afford each patient quality time with our provider, please arrive at least 15 minutes before your scheduled appointment time.   If you have a lab appointment with the Cancer Center please come in thru the  Main Entrance and check in at the main information desk  You need to re-schedule your appointment should you arrive 10 or more minutes late.  We strive to give you quality time with our providers, and arriving late affects you and other patients whose appointments are after yours.  Also, if you no show three or more times for appointments you may be dismissed from the clinic at the providers discretion.     Again, thank you for choosing [facility].  Our hope is that these requests will decrease the amount of time that you wait before being seen by our physicians.       _____________________________________________________________  Should you have questions after your visit to [facility], please contact our office at [phone] between the hours of 8:00 a.m. and 4:30 p.m.  Voicemails left after 4:00 p.m. will not be returned until the following business day.  For prescription refill requests, have your pharmacy contact our office and allow 72 hours.    Cancer Center Support Programs:   > Cancer Support Group  2nd Tuesday of the month 1pm-2pm, Journey Room    

## 2018-06-09 NOTE — Progress Notes (Signed)
[facility] NOTE  Patient Care Team: Kathyrn Drown, MD as PCP - General Branch, Alphonse Guild, MD as PCP - Cardiology (Cardiology) Deboraha Sprang, MD (Cardiology) [name], MD (Gastroenterology)  CHIEF COMPLAINTS/PURPOSE OF CONSULTATION: History of sigmoid colon neoplasm.  HISTORY OF PRESENTING ILLNESS:  [name] 78 y.o. female is here because she has been referred to [place] by her primary care doctor office for right sided abdominal pain, unexplained weight loss, and early satiety. He also has a history of sigmoid colon neoplasm. She reports she has been feeling nausea every morning when she first wakes up. It usually subsides after an hour or less. She also has nausea after meals. She reports she has never had a huge appetite but it is the same and has not changes or decreased. Despite she is still loosing weight. She denies any depression or anxiety. Denies any nausea, vomiting, or diarrhea. Denies any new pains. Had not noticed any recent bleeding such as epistaxis, hematuria or hematochezia. Denies recent chest pain on exertion, shortness of breath on minimal exertion, pre-syncopal episodes, or palpitations. Denies any numbness or tingling in hands or feet. Denies any recent fevers, infections, or recent hospitalizations. Patient reports appetite at 100% and energy level at 75%. She has a family history of father with lung cancer. Brother with rectal cancer. She had 2 maternal uncles with cancer of unknown origin.  She lives at home with her husband and is full functioning. She performs all her own ALDs and activities. She is able to drive and handle her own finances. 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   MEDICAL HISTORY:  Past Medical History:  Diagnosis Date  . Adenocarcinoma, colon ([place]) 03/2005   Stage I; resected in 03/2005  . Arthritis   . Colon cancer ([place])    Removed surgicaly   . Gastroesophageal reflux disease   .  Gastroparesis   . Hearing loss    mild  . Hyperlipidemia   . Irregular heartbeat   . Ovarian cyst 2008  . Paroxysmal atrial fibrillation ([facility])    Palpitations; maintained on flecainide; -normal coronary angiography and 1999; negative stress nuclear study in 11/2005  . PONV (postoperative nausea and vomiting)   . Tobacco abuse, in remission    10-pack-years; quit in 2001    SURGICAL HISTORY: Past Surgical History:  Procedure Laterality Date  . ABDOMINAL HYSTERECTOMY  1983   partial  . BACK SURGERY    . BIOPSY  10/18/2017   Procedure: BIOPSY;  Surgeon: [name], MD;  [facility];  Service: Endoscopy;;  gastric  . CHOLECYSTECTOMY    . COLON SURGERY  2006   Colon cancer  . COLONOSCOPY  03/25/2012   [name], MD  . COLONOSCOPY N/A 04/26/2017   Procedure: COLONOSCOPY;  Surgeon: [name], MD;  Location[facility];  Service: Endoscopy;  Laterality: N/A;  2:00  . COSMETIC SURGERY  09/2007  . ESOPHAGOGASTRODUODENOSCOPY N/A 10/18/2017   Procedure: ESOPHAGOGASTRODUODENOSCOPY (EGD);  Surgeon: [name], MD;  Location[facility];  Service: Endoscopy;  Laterality: N/A;  . PARTIAL COLECTOMY  2006   adenocarcinoma  . POLYPECTOMY  04/26/2017   Procedure: POLYPECTOMY;  Surgeon: [name], MD;  Location[facility];  Service: Endoscopy;;  colon  . TONSILLECTOMY      SOCIAL HISTORY: Social History   Socioeconomic History  . Marital status: Married    Spouse name:  Not on file  . Number of children: Not on file  . Years of education: Not on file  . Highest education level: Not on file  Occupational History  . Occupation: Medical laboratory scientific officer  . Financial resource strain: Not on file  . Food insecurity:    Worry: Not on file    Inability: Not on file  . Transportation needs:    Medical: Not on file    Non-medical: Not on file  Tobacco Use  . Smoking status: Former Smoker    Packs/day: 0.30    Years: 40.00    Pack years:  12.00    Start date: 02/23/1963    Last attempt to quit: 03/25/2002    Years since quitting: 16.2  . Smokeless tobacco: Never Used  . Tobacco comment: Quit smoking in 2001  Substance and Sexual Activity  . Alcohol use: No    Alcohol/week: 0.0 standard drinks  . Drug use: No  . Sexual activity: Yes    Birth control/protection: Surgical  Lifestyle  . Physical activity:    Days per week: Not on file    Minutes per session: Not on file  . Stress: Not on file  Relationships  . Social connections:    Talks on phone: Not on file    Gets together: Not on file    Attends religious service: Not on file    Active member of club or organization: Not on file    Attends meetings of clubs or organizations: Not on file    Relationship status: Not on file  . Intimate partner violence:    Fear of current or ex partner: Not on file    Emotionally abused: Not on file    Physically abused: Not on file    Forced sexual activity: Not on file  Other Topics Concern  . Not on file  Social History Narrative   Lives with husband in a one story home.  Has 3 children.  Retired from Medical sales representative work.  Education: some college.    FAMILY HISTORY: Family History  Problem Relation Age of Onset  . Heart disease Mother   . Stroke Mother   . Heart attack Mother   . Cancer Father        lung  . Heart disease Father   . Hypertension Father   . Cancer Brother        rectal  . Healthy Son   . Heart failure Sister   . Stroke Sister     ALLERGIES:  has No Known Allergies.  MEDICATIONS:  Current Outpatient Medications  Medication Sig Dispense Refill  . Calcium Carb-Cholecalciferol (CALCIUM 1000 + D PO) Take 1 tablet by mouth daily.    Marland Kitchen ELIQUIS 5 MG TABS tablet TAKE ONE TABLET BY MOUTH TWICE A DAY 60 tablet 3  . flecainide (TAMBOCOR) 100 MG tablet TAKE ONE TABLET EVERY 12 HOURS 180 tablet 3  . gabapentin (NEURONTIN) 100 MG capsule Take 200 mg by mouth 3 (three) times daily.   2  .  HYDROcodone-acetaminophen (NORCO) 10-325 MG tablet Take 1 tablet by mouth every 6 (six) hours as needed for moderate pain.     Marland Kitchen lactulose (CHRONULAC) 10 GM/15ML solution Take 20 g by mouth at bedtime.    Marland Kitchen MEGARED OMEGA-3 KRILL OIL 500 MG CAPS Take 500 mg by mouth daily.     Marland Kitchen omeprazole (PRILOSEC) 20 MG capsule Take 20 mg by mouth daily before breakfast.     . ranitidine (ZANTAC) 150  MG tablet Take 150 mg by mouth at bedtime.    . rosuvastatin (CRESTOR) 40 MG tablet TAKE ONE (1) TABLET BY MOUTH EVERY DAY 90 tablet 1  . vitamin B-12 (CYANOCOBALAMIN) 1000 MCG tablet Take 1,000 mcg by mouth daily.     No current facility-administered medications for this visit.     REVIEW OF SYSTEMS:   Constitutional: Denies fevers, chills or abnormal night sweats Eyes: Denies blurriness of vision, double vision or watery eyes Ears, nose, mouth, throat, and face: Denies mucositis or sore throat Respiratory: Denies cough, dyspnea or wheezes Cardiovascular: Denies palpitation, chest discomfort or lower extremity swelling Gastrointestinal:  Denies nausea, heartburn or change in bowel habits Skin: Denies abnormal skin rashes Lymphatics: Denies new lymphadenopathy or easy bruising Neurological:Denies numbness, tingling or new weaknesses Behavioral/Psych: Mood is stable, no new changes  All other systems were reviewed with the patient and are negative.  PHYSICAL EXAMINATION: ECOG PERFORMANCE STATUS: 1 - Symptomatic but completely ambulatory  Vitals:   06/09/18 1400  BP: (!) 114/53  Pulse: 66  Resp: 16  Temp: 97.8 F (36.6 C)  SpO2: 100%   Filed Weights   06/09/18 1400  Weight: 103 lb 6 oz (46.9 kg)    GENERAL:alert, no distress and comfortable SKIN: skin color, texture, turgor are normal, no rashes or significant lesions EYES: normal, conjunctiva are pink and non-injected, sclera clear OROPHARYNX:no exudate, no erythema and lips, buccal mucosa, and tongue normal  NECK: supple, thyroid normal  size, non-tender, without nodularity LYMPH:  no palpable lymphadenopathy in the cervical, axillary or inguinal LUNGS: clear to auscultation and percussion with normal breathing effort HEART: regular rate & rhythm and no murmurs and no lower extremity edema ABDOMEN:abdomen soft, non-tender and normal bowel sounds Musculoskeletal:no cyanosis of digits and no clubbing  PSYCH: alert & oriented x 3 with fluent speech NEURO: no focal motor/sensory deficits  LABORATORY DATA:  I have reviewed the data as listed Lab Results  Component Value Date   WBC 7.7 05/23/2018   HGB 12.6 05/23/2018   HCT 37.7 05/23/2018   MCV 101 (H) 05/23/2018   PLT 294 05/23/2018     Chemistry      Component Value Date/Time   NA 142 05/23/2018 1536   K 4.3 05/23/2018 1536   CL 105 05/23/2018 1536   CO2 22 05/23/2018 1536   BUN 24 05/23/2018 1536   CREATININE 0.90 05/23/2018 1536   CREATININE 0.94 12/25/2013 1037      Component Value Date/Time   CALCIUM 9.7 05/23/2018 1536   ALKPHOS 61 02/08/2018 0810   AST 14 02/08/2018 0810   ALT 15 02/08/2018 0810   BILITOT 0.3 02/08/2018 0810       RADIOGRAPHIC STUDIES: I have personally reviewed the radiological images as listed and agreed with the findings in the report. Ct Abdomen Pelvis W Contrast  Result Date: 06/08/2018 CLINICAL DATA:  Right-sided abdominal discomfort with nausea, vomiting, fatigue, and 15 pound weight loss since last September. History of colon cancer status post resection. EXAM: CT ABDOMEN AND PELVIS WITH CONTRAST TECHNIQUE: Multidetector CT imaging of the abdomen and pelvis was performed using the standard protocol following bolus administration of intravenous contrast. CONTRAST:  134mL ISOVUE-300 IOPAMIDOL (ISOVUE-300) INJECTION 61% COMPARISON:  CT abdomen pelvis dated Sep 29, 2010. FINDINGS: Lower chest: No acute abnormality. Hepatobiliary: Small amount of focal fat along the falciform ligament. No other focal liver abnormality. Status post  cholecystectomy. Unchanged mild intrahepatic biliary dilatation. Pancreas: Unchanged mild dilatation of the proximal main pancreatic duct.  No focal mass or surrounding inflammatory changes. Spleen: Normal in size without focal abnormality. Adrenals/Urinary Tract: Adrenal glands are unremarkable. Kidneys are normal, without renal calculi, focal lesion, or hydronephrosis. Bladder is decompressed. Stomach/Bowel: Diffuse gastric wall thickening. No bowel wall thickening, distention, or surrounding inflammatory changes. Prior partial sigmoid colectomy with prominent stool at the anastomosis. Mild sigmoid diverticulosis. Vascular/Lymphatic: Aortic atherosclerosis. No enlarged abdominal or pelvic lymph nodes. Reproductive: Slight interval enlargement of the oval low-density lesion in the right adnexa, measuring 3.6 cm, previously 2.9 cm. Status post hysterectomy. Other: No abdominal wall hernia or abnormality. No abdominopelvic ascites. No pneumoperitoneum. Musculoskeletal: No acute or significant osseous findings. IMPRESSION: 1. Diffuse gastric wall thickening may reflect gastritis. 2. 3.6 cm right adnexal low-density lesion, only mildly increased in size since 2012, favoring benign etiology. However, definitive evaluation with pelvic ultrasound is still recommended. This recommendation follows ACR consensus guidelines: White Paper of the ACR Incidental Findings Committee II on Adnexal Findings. J Am Coll Radiol [id number]. Electronically Signed   By: Titus Dubin M.D.   On: 06/08/2018 16:45    ASSESSMENT & PLAN:  Malignant neoplasm of colon ([place]) 1.  Significant weight loss: - Patient lost approximately 15 pounds over the last 4 months.  She was not trying to lose weight. -She had a history of sigmoid polypectomy in October 2006 which showed invasive adenocarcinoma centrally on surface of the polyp with extension of tumor to cauterized margin.  In November 2006, she underwent segmental resection of the  sigmoid colon which did not show any evidence of malignancy. -Colonoscopy on 04/26/2017 showed polyp in the cecum, resected and treated with APC.  2 small polyps in the descending colon resected.  Anastomotic site was within normal limits.  Biopsy results showed tubular adenoma in the cecal and descending colon polyps. -EGD on 10/18/2017 shows normal esophagus, Z line regular, 38 cm from the incisors, 2 cm hiatal hernia, few gastric polyps biopsied.  Normal duodenal bulb and second portion of the duodenum.  This showed reactive gastropathy with focal intestinal metaplasia.  H. pylori was negative. -Bilateral mammograms on 07/07/2017 was BI-RADS Category 1. -Patient does report early satiety after eating.  A CT scan on 06/08/2018 of the abdomen and pelvis shows diffuse gastric wall thickening.  There is also a 3.6 cm right adnexal low-density lesion, mildly increased in size since 2012. -She also reports easy fatigue. - Based on these findings, I have recommended a whole-body PET CT scan and a CEA level. -We will see her back after the scan.  2.  Family history: -Father had lung cancer.  Brother had rectal cancer.  Maternal uncle had unknown cancer.  3.  Smoking history: -Patient smoked 1 pack/week starting from age 62 through age 69.    Orders Placed This Encounter  Procedures  . NM PET Image Initial (PI) Skull Base To Thigh    Standing Status:   Future    Standing Expiration Date:   06/09/2019    Order Specific Question:   ** REASON FOR EXAM (FREE TEXT)    Answer:   history of colon cancer    Order Specific Question:   If indicated for the ordered procedure, I authorize the administration of a radiopharmaceutical per Radiology protocol    Answer:   Yes    Order Specific Question:   Preferred imaging location?    Answer:   [facility]    Order Specific Question:   Radiology Contrast Protocol - do NOT remove file path  Answer:   [email or web address]  . CEA     Standing Status:   Future    Number of Occurrences:   1    Standing Expiration Date:   06/09/2019    All questions were answered. The patient knows to call the clinic with any problems, questions or concerns.      Derek Jack, MD 06/10/2018 2:33 PM

## 2018-06-10 ENCOUNTER — Encounter (HOSPITAL_COMMUNITY): Payer: Self-pay | Admitting: Hematology

## 2018-06-10 LAB — CEA: CEA: 3.1 ng/mL (ref 0.0–4.7)

## 2018-06-10 NOTE — Assessment & Plan Note (Signed)
1.  Significant weight loss: - Patient lost approximately 15 pounds over the last 4 months.  She was not trying to lose weight. -She had a history of sigmoid polypectomy in October 2006 which showed invasive adenocarcinoma centrally on surface of the polyp with extension of tumor to cauterized margin.  In November 2006, she underwent segmental resection of the sigmoid colon which did not show any evidence of malignancy. -Colonoscopy on 04/26/2017 showed polyp in the cecum, resected and treated with APC.  2 small polyps in the descending colon resected.  Anastomotic site was within normal limits.  Biopsy results showed tubular adenoma in the cecal and descending colon polyps. -EGD on 10/18/2017 shows normal esophagus, Z line regular, 38 cm from the incisors, 2 cm hiatal hernia, few gastric polyps biopsied.  Normal duodenal bulb and second portion of the duodenum.  This showed reactive gastropathy with focal intestinal metaplasia.  H. pylori was negative. -Bilateral mammograms on 07/07/2017 was BI-RADS Category 1. -Patient does report early satiety after eating.  A CT scan on 06/08/2018 of the abdomen and pelvis shows diffuse gastric wall thickening.  There is also a 3.6 cm right adnexal low-density lesion, mildly increased in size since 2012. -She also reports easy fatigue. - Based on these findings, I have recommended a whole-body PET CT scan and a CEA level. -We will see her back after the scan.  2.  Family history: -Father had lung cancer.  Brother had rectal cancer.  Maternal uncle had unknown cancer.  3.  Smoking history: -Patient smoked 1 pack/week starting from age 42 through age 57.

## 2018-06-14 ENCOUNTER — Ambulatory Visit (HOSPITAL_COMMUNITY)
Admission: RE | Admit: 2018-06-14 | Discharge: 2018-06-14 | Disposition: A | Discharge status: Home / Self Care | Payer: Medicare Other | Source: Ambulatory Visit | Attending: Family Medicine | Admitting: Family Medicine

## 2018-06-14 DIAGNOSIS — R9389 Abnormal findings on diagnostic imaging of other specified body structures: Secondary | ICD-10-CM | POA: Insufficient documentation

## 2018-06-14 DIAGNOSIS — N838 Other noninflammatory disorders of ovary, fallopian tube and broad ligament: Secondary | ICD-10-CM | POA: Diagnosis not present

## 2018-06-15 ENCOUNTER — Ambulatory Visit (INDEPENDENT_AMBULATORY_CARE_PROVIDER_SITE_OTHER): Payer: Medicare Other | Admitting: Internal Medicine

## 2018-06-22 ENCOUNTER — Encounter (INDEPENDENT_AMBULATORY_CARE_PROVIDER_SITE_OTHER): Payer: Self-pay | Admitting: Internal Medicine

## 2018-06-22 ENCOUNTER — Telehealth (INDEPENDENT_AMBULATORY_CARE_PROVIDER_SITE_OTHER): Payer: Self-pay | Admitting: Internal Medicine

## 2018-06-22 ENCOUNTER — Ambulatory Visit (INDEPENDENT_AMBULATORY_CARE_PROVIDER_SITE_OTHER): Payer: Medicare Other | Admitting: Internal Medicine

## 2018-06-22 VITALS — BP 100/54 | HR 72 | Temp 97.9°F | Ht 62.0 in | Wt 103.5 lb

## 2018-06-22 DIAGNOSIS — R634 Abnormal weight loss: Secondary | ICD-10-CM | POA: Diagnosis not present

## 2018-06-22 DIAGNOSIS — Z800 Family history of malignant neoplasm of digestive organs: Secondary | ICD-10-CM

## 2018-06-22 DIAGNOSIS — C189 Malignant neoplasm of colon, unspecified: Secondary | ICD-10-CM

## 2018-06-22 DIAGNOSIS — R935 Abnormal findings on diagnostic imaging of other abdominal regions, including retroperitoneum: Secondary | ICD-10-CM

## 2018-06-22 DIAGNOSIS — K3189 Other diseases of stomach and duodenum: Secondary | ICD-10-CM

## 2018-06-22 DIAGNOSIS — Z86010 Personal history of colonic polyps: Secondary | ICD-10-CM

## 2018-06-22 DIAGNOSIS — R110 Nausea: Secondary | ICD-10-CM | POA: Diagnosis not present

## 2018-06-22 DIAGNOSIS — Z85038 Personal history of other malignant neoplasm of large intestine: Secondary | ICD-10-CM | POA: Diagnosis not present

## 2018-06-22 NOTE — Telephone Encounter (Signed)
EGD added to TCS on 07/06/18, patient aware

## 2018-06-22 NOTE — Telephone Encounter (Signed)
Patient is aware of EGD

## 2018-06-22 NOTE — Addendum Note (Signed)
Addended by: Butch Penny on: 06/22/2018 04:32 PM   Modules accepted: Orders, SmartSet

## 2018-06-22 NOTE — Patient Instructions (Signed)
Will discuss with Dr.Rehman. 

## 2018-06-22 NOTE — Telephone Encounter (Signed)
Ann, EGD. Patient was aware that I was going to talk with Dr. Laural Golden about EGD. You can go ahead and set up. She scheduled for a colonoscopy this month. Dx Abnormal CT. Gastric thickening.

## 2018-06-22 NOTE — Progress Notes (Signed)
Subjective:    Patient ID: [name], female    DOB: 13-Dec-1940, 78 y.o.   MRN: [id number]  HPI Here today for f/u. Underwent a CT abdomen/pelvis on 06/08/2018 for rt sided abdominal discomfort, nausea, vomiting, fatigue and 15 pound weight loss since last September. Hx of colon cancer status post resection. CT revealed 1. Diffuse gastric wall thickening may reflect gastritis. EGD on 10/18/2017 for early satiety and weight loss revealed Normal esophagus, 2 cm hiatal hernia. Few gastric polyps. Normal duodenal bulb and second portion of the duodenum.  She says she can eat 1 biscuit and itsfeels like she has eat 12. She says she has a lot of nausea. She has epigastric pain . Her last weight with me was 107. Today it is 103.5. She says her eating habits have not changed.  She is scheduled for a PET scan on 06/27/2018. She stays tired all the time.   Hx of atrial fib and maintained on Eliquis Review of Systems Past Medical History:  Diagnosis Date  . Adenocarcinoma, colon ([place]) 03/2005   Stage I; resected in 03/2005  . Arthritis   . Colon cancer ([place])    Removed surgicaly   . Gastroesophageal reflux disease   . Gastroparesis   . Hearing loss    mild  . Hyperlipidemia   . Irregular heartbeat   . Ovarian cyst 2008  . Paroxysmal atrial fibrillation ([facility])    Palpitations; maintained on flecainide; -normal coronary angiography and 1999; negative stress nuclear study in 11/2005  . PONV (postoperative nausea and vomiting)   . Tobacco abuse, in remission    10-pack-years; quit in 2001    Past Surgical History:  Procedure Laterality Date  . ABDOMINAL HYSTERECTOMY  1983   partial  . BACK SURGERY    . BIOPSY  10/18/2017   Procedure: BIOPSY;  Surgeon: [name], MD;  [facility];  Service: Endoscopy;;  gastric  . CHOLECYSTECTOMY    . COLON SURGERY  2006   Colon cancer  . COLONOSCOPY  03/25/2012   [name], MD  . COLONOSCOPY N/A 04/26/2017   Procedure:  COLONOSCOPY;  Surgeon: [name], MD;  [facility];  Service: Endoscopy;  Laterality: N/A;  2:00  . COSMETIC SURGERY  09/2007  . ESOPHAGOGASTRODUODENOSCOPY N/A 10/18/2017   Procedure: ESOPHAGOGASTRODUODENOSCOPY (EGD);  Surgeon: [name], MD;  [facility];  Service: Endoscopy;  Laterality: N/A;  . PARTIAL COLECTOMY  2006   adenocarcinoma  . POLYPECTOMY  04/26/2017   Procedure: POLYPECTOMY;  Surgeon: [name], MD;  [facility];  Service: Endoscopy;;  colon  . TONSILLECTOMY      No Known Allergies  Current Outpatient Medications on File Prior to Visit  Medication Sig Dispense Refill  . Calcium Carb-Cholecalciferol (CALCIUM 1000 + D PO) Take 1 tablet by mouth daily.    Marland Kitchen ELIQUIS 5 MG TABS tablet TAKE ONE TABLET BY MOUTH TWICE A DAY 60 tablet 3  . flecainide (TAMBOCOR) 100 MG tablet TAKE ONE TABLET EVERY 12 HOURS 180 tablet 3  . gabapentin (NEURONTIN) 100 MG capsule Take 200 mg by mouth 3 (three) times daily.   2  . HYDROcodone-acetaminophen (NORCO) 10-325 MG tablet Take 1 tablet by mouth every 6 (six) hours as needed for moderate pain.     Marland Kitchen lactulose (CHRONULAC) 10 GM/15ML solution Take 20 g by mouth at bedtime.    Marland Kitchen MEGARED OMEGA-3 KRILL OIL 500 MG CAPS Take 500  mg by mouth daily.     Marland Kitchen omeprazole (PRILOSEC) 20 MG capsule Take 20 mg by mouth daily before breakfast.     . ranitidine (ZANTAC) 150 MG tablet Take 150 mg by mouth at bedtime.    . rosuvastatin (CRESTOR) 40 MG tablet TAKE ONE (1) TABLET BY MOUTH EVERY DAY 90 tablet 1  . vitamin B-12 (CYANOCOBALAMIN) 1000 MCG tablet Take 1,000 mcg by mouth daily.     No current facility-administered medications on file prior to visit.         Objective:   Physical Exam Blood pressure (!) 100/54, pulse 72, temperature 97.9 F (36.6 C), height 5\' 2"  (1.575 m), weight 103 lb 8 oz (46.9 kg). Alert and oriented. Skin warm and dry. Oral mucosa is moist.   . Sclera anicteric, conjunctivae is  pink. Thyroid not enlarged. No cervical lymphadenopathy. Lungs clear. Heart regular rate and rhythm.  Abdomen is soft. Bowel sounds are positive. No hepatomegaly. No abdominal masses felt. No tenderness.  No edema to lower extremities.  .         Assessment & Plan:  Abnormal CT. She has had some weight loss and early satiety. Will Discuss with Dr. Laural Golden. May add an EGD.

## 2018-06-23 DIAGNOSIS — K3189 Other diseases of stomach and duodenum: Secondary | ICD-10-CM | POA: Insufficient documentation

## 2018-06-23 DIAGNOSIS — R935 Abnormal findings on diagnostic imaging of other abdominal regions, including retroperitoneum: Secondary | ICD-10-CM | POA: Insufficient documentation

## 2018-06-27 ENCOUNTER — Ambulatory Visit (HOSPITAL_COMMUNITY)
Admission: RE | Admit: 2018-06-27 | Discharge: 2018-06-27 | Disposition: A | Discharge status: Home / Self Care | Payer: Medicare Other | Source: Ambulatory Visit | Attending: Nurse Practitioner | Admitting: Nurse Practitioner

## 2018-06-27 DIAGNOSIS — C189 Malignant neoplasm of colon, unspecified: Secondary | ICD-10-CM | POA: Diagnosis not present

## 2018-06-27 DIAGNOSIS — C187 Malignant neoplasm of sigmoid colon: Secondary | ICD-10-CM | POA: Diagnosis not present

## 2018-06-27 MED ORDER — FLUDEOXYGLUCOSE F - 18 (FDG) INJECTION
6.6700 | Freq: Once | INTRAVENOUS | Status: AC | PRN
  Administered 2018-06-27: 6.67 via INTRAVENOUS

## 2018-06-30 ENCOUNTER — Other Ambulatory Visit: Payer: Self-pay

## 2018-06-30 ENCOUNTER — Inpatient Hospital Stay (HOSPITAL_COMMUNITY): Payer: Medicare Other | Attending: Hematology | Admitting: Hematology

## 2018-06-30 ENCOUNTER — Encounter (HOSPITAL_COMMUNITY): Payer: Self-pay | Admitting: Hematology

## 2018-06-30 VITALS — BP 109/43 | HR 67 | Temp 97.6°F | Resp 16 | Wt 103.3 lb

## 2018-06-30 DIAGNOSIS — I480 Paroxysmal atrial fibrillation: Secondary | ICD-10-CM | POA: Insufficient documentation

## 2018-06-30 DIAGNOSIS — Z87891 Personal history of nicotine dependence: Secondary | ICD-10-CM | POA: Diagnosis not present

## 2018-06-30 DIAGNOSIS — R634 Abnormal weight loss: Secondary | ICD-10-CM | POA: Insufficient documentation

## 2018-06-30 DIAGNOSIS — C187 Malignant neoplasm of sigmoid colon: Secondary | ICD-10-CM

## 2018-06-30 DIAGNOSIS — Z801 Family history of malignant neoplasm of trachea, bronchus and lung: Secondary | ICD-10-CM | POA: Diagnosis not present

## 2018-06-30 DIAGNOSIS — Z7901 Long term (current) use of anticoagulants: Secondary | ICD-10-CM | POA: Insufficient documentation

## 2018-06-30 DIAGNOSIS — R6881 Early satiety: Secondary | ICD-10-CM | POA: Diagnosis not present

## 2018-06-30 DIAGNOSIS — R110 Nausea: Secondary | ICD-10-CM

## 2018-06-30 DIAGNOSIS — Z85038 Personal history of other malignant neoplasm of large intestine: Secondary | ICD-10-CM | POA: Diagnosis not present

## 2018-06-30 DIAGNOSIS — Z79899 Other long term (current) drug therapy: Secondary | ICD-10-CM

## 2018-06-30 DIAGNOSIS — Z800 Family history of malignant neoplasm of digestive organs: Secondary | ICD-10-CM | POA: Insufficient documentation

## 2018-06-30 MED ORDER — PROCHLORPERAZINE MALEATE 10 MG PO TABS: 10.0000 mg | ORAL_TABLET | Freq: Four times a day (QID) | ORAL | 0 refills | Status: DC | PRN

## 2018-06-30 NOTE — Assessment & Plan Note (Signed)
1.  Significant weight loss: - Patient lost approximately 15 pounds over the last 4 months.  She was not trying to lose weight. -She had a history of sigmoid polypectomy in October 2006 which showed invasive adenocarcinoma centrally on surface of the polyp with extension of tumor to cauterized margin.  In November 2006, she underwent segmental resection of the sigmoid colon which did not show any evidence of malignancy. -Colonoscopy on 04/26/2017 showed polyp in the cecum, resected and treated with APC.  2 small polyps in the descending colon resected.  Anastomotic site was within normal limits.  Biopsy results showed tubular adenoma in the cecal and descending colon polyps. -EGD on 10/18/2017 shows normal esophagus, Z line regular, 38 cm from the incisors, 2 cm hiatal hernia, few gastric polyps biopsied.  Normal duodenal bulb and second portion of the duodenum.  This showed reactive gastropathy with focal intestinal metaplasia.  H. pylori was negative. -Bilateral mammograms on 07/07/2017 was BI-RADS Category 1. -She does report early satiety after eating.  A CT scan on 06/08/2018 of the abdomen and pelvis shows diffuse gastric wall thickening.  There is also a 3.6 cm right adnexal low-density lesion, mildly increased in size since 2012. -Ultrasound of the pelvis on 06/14/2018 shows cystic mass in the right adnexal region measuring 3.7 cm with benign characteristics.  Given its size, follow-up ultrasound was recommended in 6 months. - We reviewed the results of the PET CT scan dated 06/27/2018 showed 2 separate hypermetabolic foci along the midline incision site of the lower ventral abdominal wall.  These 2 areas have been stable since CT scan from May 2012.  Her CEA was also within normal limits. - No evidence of malignancy at this time.  I have recommended her to drink boost/Ensure 1 to 2 cans/day.  I will reevaluate her in 4 months.  If she is continuing to lose weight, we will consider upper endoscopy.  2.   Family history: -Father had lung cancer.  Brother had rectal cancer.  Maternal uncle had unknown cancer.  3.  Smoking history: -Patient smoked 1 pack/week starting from age 10 through age 61.

## 2018-06-30 NOTE — Patient Instructions (Signed)
[facility] Discharge Instructions Follow up in 4 months with labs    Thank you for choosing  [facility] Center at [facility] to provide your oncology and hematology care.  To afford each patient quality time with our provider, please arrive at least 15 minutes before your scheduled appointment time.   If you have a lab appointment with the Cancer Center please come in thru the  Main Entrance and check in at the main information desk  You need to re-schedule your appointment should you arrive 10 or more minutes late.  We strive to give you quality time with our providers, and arriving late affects you and other patients whose appointments are after yours.  Also, if you no show three or more times for appointments you may be dismissed from the clinic at the providers discretion.     Again, thank you for choosing [facility].  Our hope is that these requests will decrease the amount of time that you wait before being seen by our physicians.       _____________________________________________________________  Should you have questions after your visit to [facility], please contact our office at [phone] between the hours of 8:00 a.m. and 4:30 p.m.  Voicemails left after 4:00 p.m. will not be returned until the following business day.  For prescription refill requests, have your pharmacy contact our office and allow 72 hours.    Cancer Center Support Programs:   > Cancer Support Group  2nd Tuesday of the month 1pm-2pm, Journey Room    

## 2018-06-30 NOTE — Progress Notes (Signed)
[place], [place] [address]   CLINIC:  Medical Oncology/Hematology  PCP:  Kathyrn Drown, MD [address] [place] [address] [phone]   REASON FOR VISIT: Follow-up for History of sigmoid colon neoplasm. AND new unexplained weight loss.  CURRENT THERAPY: observation   INTERVAL HISTORY:  [name] 78 y.o. female returns for routine follow-up for history of sigmoid colon neoplasm and new unexplained weight loss. [name] is here today with her husband. [name] is still having fatigue and weight loss. [name] has nausea and dizziness every  Morning when [name] wakes up. [name] is trying to drink ensure or boost daily. Denies any nausea, vomiting, or diarrhea. Denies any new pains. Had not noticed any recent bleeding such as epistaxis, hematuria or hematochezia. Denies recent chest pain on exertion, shortness of breath on minimal exertion, pre-syncopal episodes, or palpitations. Denies any numbness or tingling in hands or feet. Denies any recent fevers, infections, or recent hospitalizations. Patient reports appetite at 100% and energy level at 75%.   REVIEW OF SYSTEMS:  Review of Systems  Constitutional: Positive for fatigue.  Gastrointestinal: Positive for nausea.  All other systems reviewed and are negative.    PAST MEDICAL/SURGICAL HISTORY:  Past Medical History:  Diagnosis Date  . Adenocarcinoma, colon ([place]) 03/2005   Stage I; resected in 03/2005  . Arthritis   . Colon cancer ([place])    Removed surgicaly   . Gastroesophageal reflux disease   . Gastroparesis   . Hearing loss    mild  . Hyperlipidemia   . Irregular heartbeat   . Ovarian cyst 2008  . Paroxysmal atrial fibrillation ([facility])    Palpitations; maintained on flecainide; -normal coronary angiography and 1999; negative stress nuclear study in 11/2005  . PONV (postoperative nausea and vomiting)   . Tobacco abuse, in remission    10-pack-years; quit in 2001   Past Surgical  History:  Procedure Laterality Date  . ABDOMINAL HYSTERECTOMY  1983   partial  . BACK SURGERY    . BIOPSY  10/18/2017   Procedure: BIOPSY;  Surgeon: [name], MD;  [facility];  Service: Endoscopy;;  gastric  . CHOLECYSTECTOMY    . COLON SURGERY  2006   Colon cancer  . COLONOSCOPY  03/25/2012   [name], MD  . COLONOSCOPY N/A 04/26/2017   Procedure: COLONOSCOPY;  Surgeon: [name], MD;  Location: [facility];  Service: Endoscopy;  Laterality: N/A;  2:00  . COSMETIC SURGERY  09/2007  . ESOPHAGOGASTRODUODENOSCOPY N/A 10/18/2017   Procedure: ESOPHAGOGASTRODUODENOSCOPY (EGD);  Surgeon: [name], MD;  [facility];  Service: Endoscopy;  Laterality: N/A;  . PARTIAL COLECTOMY  2006   adenocarcinoma  . POLYPECTOMY  04/26/2017   Procedure: POLYPECTOMY;  Surgeon: [name], MD;  [facility]: [facility];  Service: Endoscopy;;  colon  . TONSILLECTOMY       SOCIAL HISTORY:  Social History   Socioeconomic History  . Marital status: Married    Spouse name: Not on file  . Number of children: Not on file  . Years of education: Not on file  . Highest education level: Not on file  Occupational History  . Occupation: Medical laboratory scientific officer  . Financial resource strain: Not on file  . Food insecurity:    Worry: Not on file    Inability: Not on file  . Transportation needs:    Medical: Not on file  Non-medical: Not on file  Tobacco Use  . Smoking status: Former Smoker    Packs/day: 0.30    Years: 40.00    Pack years: 12.00    Start date: 02/23/1963    Last attempt to quit: 03/25/2002    Years since quitting: 16.2  . Smokeless tobacco: Never Used  . Tobacco comment: Quit smoking in 2001  Substance and Sexual Activity  . Alcohol use: No    Alcohol/week: 0.0 standard drinks  . Drug use: No  . Sexual activity: Yes    Birth control/protection: Surgical  Lifestyle  . Physical activity:    Days per week: Not on file     Minutes per session: Not on file  . Stress: Not on file  Relationships  . Social connections:    Talks on phone: Not on file    Gets together: Not on file    Attends religious service: Not on file    Active member of club or organization: Not on file    Attends meetings of clubs or organizations: Not on file    Relationship status: Not on file  . Intimate partner violence:    Fear of current or ex partner: Not on file    Emotionally abused: Not on file    Physically abused: Not on file    Forced sexual activity: Not on file  Other Topics Concern  . Not on file  Social History Narrative   Lives with husband in a one story home.  Has 3 children.  Retired from Medical sales representative work.  Education: some college.    FAMILY HISTORY:  Family History  Problem Relation Age of Onset  . Heart disease Mother   . Stroke Mother   . Heart attack Mother   . Cancer Father        lung  . Heart disease Father   . Hypertension Father   . Cancer Brother        rectal  . Healthy Son   . Heart failure Sister   . Stroke Sister     CURRENT MEDICATIONS:  Outpatient Encounter Medications as of 06/30/2018  Medication Sig  . Calcium Carb-Cholecalciferol (CALCIUM 1000 + D PO) Take 1 tablet by mouth daily.  Marland Kitchen ELIQUIS 5 MG TABS tablet TAKE ONE TABLET BY MOUTH TWICE A DAY (Patient taking differently: Take 5 mg by mouth 2 (two) times daily. )  . flecainide (TAMBOCOR) 100 MG tablet TAKE ONE TABLET EVERY 12 HOURS (Patient taking differently: Take 100 mg by mouth 2 (two) times daily. )  . gabapentin (NEURONTIN) 100 MG capsule Take 200 mg by mouth daily.   Marland Kitchen HYDROcodone-acetaminophen (NORCO) 10-325 MG tablet Take 1 tablet by mouth every 6 (six) hours as needed for moderate pain.   Marland Kitchen lactulose (CHRONULAC) 10 GM/15ML solution Take 20 g by mouth at bedtime.  Marland Kitchen MEGARED OMEGA-3 KRILL OIL 500 MG CAPS Take 500 mg by mouth daily.   Marland Kitchen omeprazole (PRILOSEC) 20 MG capsule Take 20 mg by mouth daily before breakfast.   .  rosuvastatin (CRESTOR) 40 MG tablet TAKE ONE (1) TABLET BY MOUTH EVERY DAY (Patient taking differently: Take 40 mg by mouth daily. TAKE ONE (1) TABLET BY MOUTH EVERY DAY)  . vitamin B-12 (CYANOCOBALAMIN) 1000 MCG tablet Take 1,000 mcg by mouth daily.  . [DISCONTINUED] ranitidine (ZANTAC) 150 MG tablet Take 150 mg by mouth at bedtime.  . prochlorperazine (COMPAZINE) 10 MG tablet Take 1 tablet (10 mg total) by mouth every 6 (six) hours as  needed for nausea or vomiting.   No facility-administered encounter medications on file as of 06/30/2018.     ALLERGIES:  No Known Allergies   PHYSICAL EXAM:  ECOG Performance status: 1  Vitals:   06/30/18 1100  BP: (!) 109/43  Pulse: 67  Resp: 16  Temp: 97.6 F (36.4 C)  SpO2: 100%   Filed Weights   06/30/18 1100  Weight: 103 lb 5 oz (46.9 kg)    Physical Exam Constitutional:      Appearance: Normal appearance. [name] is normal weight.  Cardiovascular:     Rate and Rhythm: Normal rate and regular rhythm.     Heart sounds: Normal heart sounds.  Pulmonary:     Effort: Pulmonary effort is normal.     Breath sounds: Normal breath sounds.  Musculoskeletal: Normal range of motion.  Skin:    General: Skin is warm and dry.  Neurological:     Mental Status: [name] is alert and oriented to person, place, and time. Mental status is at baseline.  Psychiatric:        Mood and Affect: Mood normal.        Behavior: Behavior normal.        Thought Content: Thought content normal.        Judgment: Judgment normal.      LABORATORY DATA:  I have reviewed the labs as listed.  CBC    Component Value Date/Time   WBC 7.7 05/23/2018 1536   WBC 6.8 12/25/2013 1037   RBC 3.73 (L) 05/23/2018 1536   RBC 3.88 12/25/2013 1037   HGB 12.6 05/23/2018 1536   HCT 37.7 05/23/2018 1536   PLT 294 05/23/2018 1536   MCV 101 (H) 05/23/2018 1536   MCH 33.8 (H) 05/23/2018 1536   MCH 33.5 12/25/2013 1037   MCHC 33.4 05/23/2018 1536   MCHC 33.7 12/25/2013 1037   RDW  11.8 05/23/2018 1536   LYMPHSABS 1.9 05/23/2018 1536   MONOABS 0.6 12/25/2013 1037   EOSABS 0.2 05/23/2018 1536   BASOSABS 0.0 05/23/2018 1536   CMP Latest Ref Rng & Units 05/23/2018 02/08/2018 09/03/2017  Glucose 65 - 99 mg/dL 99 96 89  BUN 8 - 27 mg/dL 24 16 23   Creatinine 0.57 - 1.00 mg/dL 0.90 1.00 0.90  Sodium 134 - 144 mmol/L 142 145(H) 140  Potassium 3.5 - 5.2 mmol/L 4.3 4.5 4.2  Chloride 96 - 106 mmol/L 105 106 102  CO2 20 - 29 mmol/L 22 24 22   Calcium 8.7 - 10.3 mg/dL 9.7 9.7 9.8  Total Protein 6.0 - 8.5 g/dL - 6.4 6.7  Total Bilirubin 0.0 - 1.2 mg/dL - 0.3 0.3  Alkaline Phos 39 - 117 IU/L - 61 71  AST 0 - 40 IU/L - 14 25  ALT 0 - 32 IU/L - 15 19       DIAGNOSTIC IMAGING:  I have independently reviewed the scans and discussed with the patient.   I have reviewed Francene Finders, NP's note and agree with the documentation.  I personally performed a face-to-face visit, made revisions and my assessment and plan is as follows.    ASSESSMENT & PLAN:   Malignant neoplasm of colon ([place]) 1.  Significant weight loss: - Patient lost approximately 15 pounds over the last 4 months.  [name] was not trying to lose weight. -[name] had a history of sigmoid polypectomy in October 2006 which showed invasive adenocarcinoma centrally on surface of the polyp with extension of tumor to cauterized margin.  In November  2006, [name] underwent segmental resection of the sigmoid colon which did not show any evidence of malignancy. -Colonoscopy on 04/26/2017 showed polyp in the cecum, resected and treated with APC.  2 small polyps in the descending colon resected.  Anastomotic site was within normal limits.  Biopsy results showed tubular adenoma in the cecal and descending colon polyps. -EGD on 10/18/2017 shows normal esophagus, Z line regular, 38 cm from the incisors, 2 cm hiatal hernia, few gastric polyps biopsied.  Normal duodenal bulb and second portion of the duodenum.  This showed reactive gastropathy  with focal intestinal metaplasia.  H. pylori was negative. -Bilateral mammograms on 07/07/2017 was BI-RADS Category 1. -[name] does report early satiety after eating.  A CT scan on 06/08/2018 of the abdomen and pelvis shows diffuse gastric wall thickening.  There is also a 3.6 cm right adnexal low-density lesion, mildly increased in size since 2012. -Ultrasound of the pelvis on 06/14/2018 shows cystic mass in the right adnexal region measuring 3.7 cm with benign characteristics.  Given its size, follow-up ultrasound was recommended in 6 months. - We reviewed the results of the PET CT scan dated 06/27/2018 showed 2 separate hypermetabolic foci along the midline incision site of the lower ventral abdominal wall.  These 2 areas have been stable since CT scan from May 2012.  Her CEA was also within normal limits. - No evidence of malignancy at this time.  I have recommended her to drink boost/Ensure 1 to 2 cans/day.  I will reevaluate her in 4 months.  If [name] is continuing to lose weight, we will consider upper endoscopy.  2.  Family history: -Father had lung cancer.  Brother had rectal cancer.  Maternal uncle had unknown cancer.  3.  Smoking history: -Patient smoked 1 pack/week starting from age 72 through age 65.        Orders placed this encounter:  No orders of the defined types were placed in this encounter.     Derek Jack, MD [place] [phone]

## 2018-07-06 ENCOUNTER — Encounter (HOSPITAL_COMMUNITY)
Admission: RE | Disposition: A | Discharge status: Home / Self Care | Payer: Self-pay | Source: Home / Self Care | Attending: Internal Medicine

## 2018-07-06 ENCOUNTER — Encounter (HOSPITAL_COMMUNITY): Payer: Self-pay | Admitting: *Deleted

## 2018-07-06 ENCOUNTER — Ambulatory Visit (HOSPITAL_COMMUNITY): Admit: 2018-07-06 | Payer: Medicare Other | Admitting: Internal Medicine

## 2018-07-06 ENCOUNTER — Other Ambulatory Visit: Payer: Self-pay

## 2018-07-06 ENCOUNTER — Encounter (HOSPITAL_COMMUNITY): Payer: Self-pay

## 2018-07-06 ENCOUNTER — Ambulatory Visit (HOSPITAL_COMMUNITY)
Admission: RE | Admit: 2018-07-06 | Discharge: 2018-07-06 | Disposition: A | Discharge status: Home / Self Care | Payer: Medicare Other | Attending: Internal Medicine | Admitting: Internal Medicine

## 2018-07-06 DIAGNOSIS — Z7901 Long term (current) use of anticoagulants: Secondary | ICD-10-CM | POA: Diagnosis not present

## 2018-07-06 DIAGNOSIS — K219 Gastro-esophageal reflux disease without esophagitis: Secondary | ICD-10-CM | POA: Insufficient documentation

## 2018-07-06 DIAGNOSIS — R110 Nausea: Secondary | ICD-10-CM | POA: Diagnosis not present

## 2018-07-06 DIAGNOSIS — R933 Abnormal findings on diagnostic imaging of other parts of digestive tract: Secondary | ICD-10-CM | POA: Insufficient documentation

## 2018-07-06 DIAGNOSIS — Z9049 Acquired absence of other specified parts of digestive tract: Secondary | ICD-10-CM | POA: Insufficient documentation

## 2018-07-06 DIAGNOSIS — K3189 Other diseases of stomach and duodenum: Secondary | ICD-10-CM | POA: Diagnosis not present

## 2018-07-06 DIAGNOSIS — R634 Abnormal weight loss: Secondary | ICD-10-CM | POA: Diagnosis not present

## 2018-07-06 DIAGNOSIS — Z860100 Personal history of colon polyps, unspecified: Secondary | ICD-10-CM

## 2018-07-06 DIAGNOSIS — E785 Hyperlipidemia, unspecified: Secondary | ICD-10-CM | POA: Diagnosis not present

## 2018-07-06 DIAGNOSIS — K317 Polyp of stomach and duodenum: Secondary | ICD-10-CM | POA: Diagnosis not present

## 2018-07-06 DIAGNOSIS — K319 Disease of stomach and duodenum, unspecified: Secondary | ICD-10-CM | POA: Diagnosis not present

## 2018-07-06 DIAGNOSIS — Z87891 Personal history of nicotine dependence: Secondary | ICD-10-CM | POA: Diagnosis not present

## 2018-07-06 DIAGNOSIS — Z980 Intestinal bypass and anastomosis status: Secondary | ICD-10-CM | POA: Insufficient documentation

## 2018-07-06 DIAGNOSIS — K644 Residual hemorrhoidal skin tags: Secondary | ICD-10-CM | POA: Insufficient documentation

## 2018-07-06 DIAGNOSIS — K449 Diaphragmatic hernia without obstruction or gangrene: Secondary | ICD-10-CM | POA: Diagnosis not present

## 2018-07-06 DIAGNOSIS — Z09 Encounter for follow-up examination after completed treatment for conditions other than malignant neoplasm: Secondary | ICD-10-CM | POA: Insufficient documentation

## 2018-07-06 DIAGNOSIS — Z85038 Personal history of other malignant neoplasm of large intestine: Secondary | ICD-10-CM

## 2018-07-06 DIAGNOSIS — D120 Benign neoplasm of cecum: Secondary | ICD-10-CM | POA: Insufficient documentation

## 2018-07-06 DIAGNOSIS — Z79899 Other long term (current) drug therapy: Secondary | ICD-10-CM | POA: Insufficient documentation

## 2018-07-06 DIAGNOSIS — Z800 Family history of malignant neoplasm of digestive organs: Secondary | ICD-10-CM | POA: Diagnosis not present

## 2018-07-06 DIAGNOSIS — I480 Paroxysmal atrial fibrillation: Secondary | ICD-10-CM | POA: Insufficient documentation

## 2018-07-06 DIAGNOSIS — R935 Abnormal findings on diagnostic imaging of other abdominal regions, including retroperitoneum: Secondary | ICD-10-CM

## 2018-07-06 DIAGNOSIS — D123 Benign neoplasm of transverse colon: Secondary | ICD-10-CM | POA: Insufficient documentation

## 2018-07-06 HISTORY — PX: ESOPHAGOGASTRODUODENOSCOPY: SHX5428

## 2018-07-06 HISTORY — PX: BIOPSY: SHX5522

## 2018-07-06 HISTORY — PX: POLYPECTOMY: SHX5525

## 2018-07-06 HISTORY — PX: COLONOSCOPY: SHX5424

## 2018-07-06 SURGERY — COLONOSCOPY
Anesthesia: Moderate Sedation

## 2018-07-06 MED ORDER — ONDANSETRON HCL 4 MG/2ML IJ SOLN
INTRAMUSCULAR | Status: AC
  Filled 2018-07-06: qty 2

## 2018-07-06 MED ORDER — SODIUM CHLORIDE 0.9 % IV SOLN
INTRAVENOUS | Status: DC
  Administered 2018-07-06: 1000 mL via INTRAVENOUS

## 2018-07-06 MED ORDER — MIDAZOLAM HCL 5 MG/5ML IJ SOLN
INTRAMUSCULAR | Status: DC | PRN
  Administered 2018-07-06 (×2): 1 mg via INTRAVENOUS
  Administered 2018-07-06: 2 mg via INTRAVENOUS
  Administered 2018-07-06 (×3): 1 mg via INTRAVENOUS

## 2018-07-06 MED ORDER — ONDANSETRON HCL 4 MG/2ML IJ SOLN
INTRAMUSCULAR | Status: DC | PRN
  Administered 2018-07-06: 4 mg via INTRAVENOUS

## 2018-07-06 MED ORDER — LIDOCAINE VISCOUS HCL 2 % MT SOLN
OROMUCOSAL | Status: AC
  Filled 2018-07-06: qty 15

## 2018-07-06 MED ORDER — MEPERIDINE HCL 50 MG/ML IJ SOLN
INTRAMUSCULAR | Status: DC | PRN
  Administered 2018-07-06 (×2): 25 mg via INTRAVENOUS

## 2018-07-06 MED ORDER — MIDAZOLAM HCL 5 MG/5ML IJ SOLN
INTRAMUSCULAR | Status: AC
  Filled 2018-07-06: qty 10

## 2018-07-06 MED ORDER — STERILE WATER FOR IRRIGATION IR SOLN
Status: DC | PRN
  Administered 2018-07-06: 10:00:00

## 2018-07-06 MED ORDER — MEPERIDINE HCL 100 MG/ML IJ SOLN
INTRAMUSCULAR | Status: AC
  Filled 2018-07-06: qty 1

## 2018-07-06 MED ORDER — LIDOCAINE VISCOUS HCL 2 % MT SOLN
OROMUCOSAL | Status: DC | PRN
  Administered 2018-07-06: 4 mL via OROMUCOSAL

## 2018-07-06 NOTE — Op Note (Signed)
[facility] Patient Name: [name] Procedure Date: 07/06/2018 10:12 AM MRN: [id number] Date of Birth: Feb 02, 1941 Attending MD: Hildred Laser , MD CSN: [id number] Age: 78 Admit Type: Outpatient Procedure:                Upper GI endoscopy Indications:              Abnormal CT of the GI tract, Nausea, Weight loss Providers:                Hildred Laser, MD, Otis Peak B. Sharon Seller, RN, Nelma Rothman, Technician Referring MD:             Sallee Lange, MD and Dr. Derek Jack, MD Medicines:                Lidocaine spray, Meperidine 50 mg IV, Midazolam 3                            mg IV Complications:            No immediate complications. Estimated Blood Loss:     Estimated blood loss was minimal. Procedure:                Pre-Anesthesia Assessment:                           - Prior to the procedure, a History and Physical                            was performed, and patient medications and                            allergies were reviewed. The patient's tolerance of                            previous anesthesia was also reviewed. The risks                            and benefits of the procedure and the sedation                            options and risks were discussed with the patient.                            All questions were answered, and informed consent                            was obtained. Prior Anticoagulants: The patient                            last took Eliquis (apixaban) 3 days prior to the                            procedure. ASA Grade Assessment: II - A patient  with mild systemic disease. After reviewing the                            risks and benefits, the patient was deemed in                            satisfactory condition to undergo the procedure.                           After obtaining informed consent, the endoscope was                            passed under direct vision. Throughout the                             procedure, the patient's blood pressure, pulse, and                            oxygen saturations were monitored continuously. The                            [id number]-H190 ([id number]) scope was introduced through the                            mouth, and advanced to the second part of duodenum.                            The upper GI endoscopy was accomplished without                            difficulty. The patient tolerated the procedure                            well. Scope In: 10:32:11 AM Scope Out: 10:44:08 AM Total Procedure Duration: 0 hours 11 minutes 57 seconds  Findings:      The examined esophagus was normal.      The Z-line was regular and was found 38 cm from the incisors.      A 2 cm hiatal hernia was present.      Three small sessile polyps with no stigmata of recent bleeding were       found in the gastric fundus and in the gastric body. Biopsies were taken       with a cold forceps for histology. The pathology specimen was placed       into Bottle Number 2. Biopsy also obtained from normal appearing antral       mucosa and placed in bottle 1.      The exam of the stomach was otherwise normal.      The duodenal bulb and second portion of the duodenum were normal. Impression:               - Normal esophagus.                           - Z-line regular, 38 cm from the incisors.                           -  2 cm hiatal hernia.                           - Three gastric polyps. Biopsied.                           - normal antral mucosa. Biopsied.                           - Normal duodenal bulb and second portion of the                            duodenum. Moderate Sedation:      Moderate (conscious) sedation was administered by the endoscopy nurse       and supervised by the endoscopist. The following parameters were       monitored: oxygen saturation, heart rate, blood pressure, CO2       capnography and response to care. Total physician  intraservice time was       17 minutes. Recommendation:           - Patient has a contact number available for                            emergencies. The signs and symptoms of potential                            delayed complications were discussed with the                            patient. Return to normal activities tomorrow.                            Written discharge instructions were provided to the                            patient.                           - Resume previous diet today.                           - Continue present medications.                           - Await pathology results.                           - See the other procedure note for documentation of                            additional recommendations. Procedure Code(s):        --- Professional ---                           [phone], Esophagogastroduodenoscopy, flexible,  transoral; with biopsy, single or multiple                           G0500, Moderate sedation services provided by the                            same physician or other qualified health care                            professional performing a gastrointestinal                            endoscopic service that sedation supports,                            requiring the presence of an independent trained                            observer to assist in the monitoring of the                            patient's level of consciousness and physiological                            status; initial 15 minutes of intra-service time;                            patient age 30 years or older (additional time may                            be reported with [phone], as appropriate) Diagnosis Code(s):        --- Professional ---                           K44.9, Diaphragmatic hernia without obstruction or                            gangrene                           K31.7, Polyp of stomach and duodenum                            R11.0, Nausea                           R63.4, Abnormal weight loss                           R93.3, Abnormal findings on diagnostic imaging of                            other parts of digestive tract CPT copyright 2018 American Medical Association. All rights reserved. The codes documented in this report are preliminary and upon coder review may  be revised to meet current compliance requirements. [employer]  Laural Golden, MD Hildred Laser, MD 07/06/2018 11:53:51 AM This report has been signed electronically. Number of Addenda: 0

## 2018-07-06 NOTE — Discharge Instructions (Signed)
Resume Eliquis on 07/11/2018. Resume other medications as before. Resume usual diet. No driving for 24 hours. Physician  will call with biopsy results.   Upper Endoscopy, Adult, Care After This sheet gives you information about how to care for yourself after your procedure. Your health care provider may also give you more specific instructions. If you have problems or questions, contact your health care provider. What can I expect after the procedure? After the procedure, it is common to have:  A sore throat.  Mild stomach pain or discomfort.  Bloating.  Nausea. Follow these instructions at home:   Follow instructions from your health care provider about what to eat or drink after your procedure.  Return to your normal activities as told by your health care provider. Ask your health care provider what activities are safe for you.  Take over-the-counter and prescription medicines only as told by your health care provider.  Do not drive for 24 hours if you were given a sedative during your procedure.  Keep all follow-up visits as told by your health care provider. This is important. Contact a health care provider if you have:  A sore throat that lasts longer than one day.  Trouble swallowing. Get help right away if:  You vomit blood or your vomit looks like coffee grounds.  You have: ? A fever. ? Bloody, black, or tarry stools. ? A severe sore throat or you cannot swallow. ? Difficulty breathing. ? Severe pain in your chest or abdomen. Summary  After the procedure, it is common to have a sore throat, mild stomach discomfort, bloating, and nausea.  Do not drive for 24 hours if you were given a sedative during the procedure.  Follow instructions from your health care provider about what to eat or drink after your procedure.  Return to your normal activities as told by your health care provider. This information is not intended to replace advice given to you by your  health care provider. Make sure you discuss any questions you have with your health care provider. Document Released: 10/27/2011 Document Revised: 09/27/2017 Document Reviewed: 09/27/2017 Elsevier Interactive Patient Education  2019 [employer].   Colonoscopy, Adult, Care After This sheet gives you information about how to care for yourself after your procedure. Your doctor may also give you more specific instructions. If you have problems or questions, call your doctor. What can I expect after the procedure? After the procedure, it is common to have:  A small amount of blood in your poop for 24 hours.  Some gas.  Mild cramping or bloating in your belly. Follow these instructions at home: General instructions  For the first 24 hours after the procedure: ? Do not drive or use machinery. ? Do not sign important documents. ? Do not drink alcohol. ? Do your daily activities more slowly than normal. ? Eat foods that are soft and easy to digest.  Take over-the-counter or prescription medicines only as told by your doctor. To help cramping and bloating:   Try walking around.  Put heat on your belly (abdomen) as told by your doctor. Use a heat source that your doctor recommends, such as a moist heat pack or a heating pad. ? Put a towel between your skin and the heat source. ? Leave the heat on for 20-30 minutes. ? Remove the heat if your skin turns bright red. This is especially important if you cannot feel pain, heat, or cold. You can get burned. Eating and drinking  Drink enough fluid to keep your pee (urine) clear or pale yellow.  Return to your normal diet as told by your doctor. Avoid heavy or fried foods that are hard to digest.  Avoid drinking alcohol for as long as told by your doctor. Contact a doctor if:  You have blood in your poop (stool) 2-3 days after the procedure. Get help right away if:  You have more than a small amount of blood in your poop.  You see  large clumps of tissue (blood clots) in your poop.  Your belly is swollen.  You feel sick to your stomach (nauseous).  You throw up (vomit).  You have a fever.  You have belly pain that gets worse, and medicine does not help your pain. Summary  After the procedure, it is common to have a small amount of blood in your poop. You may also have mild cramping and bloating in your belly.  For the first 24 hours after the procedure, do not drive or use machinery, do not sign important documents, and do not drink alcohol.  Get help right away if you have a lot of blood in your poop, feel sick to your stomach, have a fever, or have more belly pain. This information is not intended to replace advice given to you by your health care provider. Make sure you discuss any questions you have with your health care provider. Document Released: 05/30/2010 Document Revised: 02/25/2017 Document Reviewed: 01/20/2016 Elsevier Interactive Patient Education  2019 [place], Adult     A hernia happens when tissue inside your body pushes out through a weak spot in your belly muscles (abdominal wall). This makes a round lump (bulge). The lump may be:  In a scar from surgery that was done in your belly (incisional hernia).  Near your belly button (umbilical hernia).  In your groin (inguinal hernia). Your groin is the area where your leg meets your lower belly (abdomen). This kind of hernia could also be: ? In your scrotum, if you are female. ? In folds of skin around your vagina, if you are female.  In your upper thigh (femoral hernia).  Inside your belly (hiatal hernia). This happens when your stomach slides above the muscle between your belly and your chest (diaphragm). If your hernia is small and it does not cause pain, you may not need treatment. If your hernia is large or it causes pain, you may need surgery. Follow these instructions at home: Activity  Avoid stretching or overusing  (straining) the muscles near your hernia. Straining can happen when you: ? Lift something heavy. ? Poop (have a bowel movement).  Do not lift anything that is heavier than 10 lb (4.5 kg), or the limit that you are told, until your doctor says that it is safe.  Use the strength of your legs when you lift something heavy. Do not use only your back muscles to lift. General instructions  Do these things if told by your doctor so you do not have trouble pooping (constipation): ? Drink enough fluid to keep your pee (urine) pale yellow. ? Eat foods that are high in fiber. These include fresh fruits and vegetables, whole grains, and beans. ? Limit foods that are high in fat and processed sugars. These include foods that are fried or sweet. ? Take medicine for trouble pooping.  When you cough, try to cough gently.  You may try to push your hernia in by very gently pressing on it when  you are lying down. Do not try to force the bulge back in if it will not push in easily.  If you are overweight, work with your doctor to lose weight safely.  Do not use any products that have nicotine or tobacco in them. These include cigarettes and e-cigarettes. If you need help quitting, ask your doctor.  If you will be having surgery (hernia repair), watch your hernia for changes in shape, size, or color. Tell your doctor if you see any changes.  Take over-the-counter and prescription medicines only as told by your doctor.  Keep all follow-up visits as told by your doctor. Contact a doctor if:  You get new pain, swelling, or redness near your hernia.  You poop fewer times in a week than normal.  You have trouble pooping.  You have poop (stool) that is more dry than normal.  You have poop that is harder or larger than normal. Get help right away if:  You have a fever.  You have belly pain that gets worse.  You feel sick to your stomach (nauseous).  You throw up (vomit).  Your hernia cannot be  pushed in by very gently pressing on it when you are lying down. Do not try to force the bulge back in if it will not push in easily.  Your hernia: ? Changes in shape or size. ? Changes color. ? Feels hard or it hurts when you touch it. These symptoms may represent a serious problem that is an emergency. Do not wait to see if the symptoms will go away. Get medical help right away. Call your local emergency services (911 in the [place] Summary  A hernia happens when tissue inside your body pushes out through a weak spot in the belly muscles. This creates a bulge.  If your hernia is small and it does not hurt, you may not need treatment. If your hernia is large or it hurts, you may need surgery.  If you will be having surgery, watch your hernia for changes in shape, size, or color. Tell your doctor about any changes. This information is not intended to replace advice given to you by your health care provider. Make sure you discuss any questions you have with your health care provider. Document Released: 10/15/2009 Document Revised: 01/27/2017 Document Reviewed: 01/27/2017 Elsevier Interactive Patient Education  2019 [place].  Colon Polyps  Polyps are tissue growths inside the body. Polyps can grow in many places, including the large intestine (colon). A polyp may be a round bump or a mushroom-shaped growth. You could have one polyp or several. Most colon polyps are noncancerous (benign). However, some colon polyps can become cancerous over time. Finding and removing the polyps early can help prevent this. What are the causes? The exact cause of colon polyps is not known. What increases the risk? You are more likely to develop this condition if you:  Have a family history of colon cancer or colon polyps.  Are older than 17 or older than 45 if you are African American.  Have inflammatory bowel disease, such as ulcerative colitis or Crohn's disease.  Have certain hereditary  conditions, such as: ? Familial adenomatous polyposis. ? Lynch syndrome. ? Turcot syndrome. ? Peutz-Jeghers syndrome.  Are overweight.  Smoke cigarettes.  Do not get enough exercise.  Drink too much alcohol.  Eat a diet that is high in fat and red meat and low in fiber.  Had childhood cancer that was treated with abdominal radiation. What are  the signs or symptoms? Most polyps do not cause symptoms. If you have symptoms, they may include:  Blood coming from your rectum when having a bowel movement.  Blood in your stool. The stool may look dark red or black.  Abdominal pain.  A change in bowel habits, such as constipation or diarrhea. How is this diagnosed? This condition is diagnosed with a colonoscopy. This is a procedure in which a lighted, flexible scope is inserted into the anus and then passed into the colon to examine the area. Polyps are sometimes found when a colonoscopy is done as part of routine cancer screening tests. How is this treated? Treatment for this condition involves removing any polyps that are found. Most polyps can be removed during a colonoscopy. Those polyps will then be tested for cancer. Additional treatment may be needed depending on the results of testing. Follow these instructions at home: Lifestyle  Maintain a healthy weight, or lose weight if recommended by your health care provider.  Exercise every day or as told by your health care provider.  Do not use any products that contain nicotine or tobacco, such as cigarettes and e-cigarettes. If you need help quitting, ask your health care provider.  If you drink alcohol, limit how much you have: ? 0-1 drink a day for women. ? 0-2 drinks a day for men.  Be aware of how much alcohol is in your drink. In the [place] one drink equals one 12 oz bottle of beer (355 mL), one 5 oz glass of wine (148 mL), or one 1 oz shot of hard liquor (44 mL). Eating and drinking   Eat foods that are high in fiber,  such as fruits, vegetables, and whole grains.  Eat foods that are high in calcium and vitamin D, such as milk, cheese, yogurt, eggs, liver, fish, and broccoli.  Limit foods that are high in fat, such as fried foods and desserts.  Limit the amount of red meat and processed meat you eat, such as hot dogs, sausage, bacon, and lunch meats. General instructions  Keep all follow-up visits as told by your health care provider. This is important. ? This includes having regularly scheduled colonoscopies. ? Talk to your health care provider about when you need a colonoscopy. Contact a health care provider if:  You have new or worsening bleeding during a bowel movement.  You have new or increased blood in your stool.  You have a change in bowel habits.  You lose weight for no known reason. Summary  Polyps are tissue growths inside the body. Polyps can grow in many places, including the colon.  Most colon polyps are noncancerous (benign), but some can become cancerous over time.  This condition is diagnosed with a colonoscopy.  Treatment for this condition involves removing any polyps that are found. Most polyps can be removed during a colonoscopy. This information is not intended to replace advice given to you by your health care provider. Make sure you discuss any questions you have with your health care provider. Document Released: 01/22/2004 Document Revised: 08/12/2017 Document Reviewed: 08/12/2017 Elsevier Interactive Patient Education  2019 [employer].

## 2018-07-06 NOTE — Op Note (Signed)
[facility] Patient Name: [name] Procedure Date: 07/06/2018 10:45 AM MRN: [id number] Date of Birth: [birth year] Attending MD: Hildred Laser , MD CSN: [id number] Age: 78 Admit Type: Outpatient Procedure:                Colonoscopy Indications:              High risk colon cancer surveillance: Personal                            history of colonic polyps, High risk colon cancer                            surveillance: Personal history of colon cancer Providers:                Hildred Laser, MD, Otis Peak B. Sharon Seller, RN, Nelma Rothman, Technician Referring MD:             Sallee Lange, MD and Derek Jack, MD Medicines:                Midazolam 4 mg IV Complications:            No immediate complications. Estimated Blood Loss:     Estimated blood loss was minimal. Procedure:                Pre-Anesthesia Assessment:                           - Prior to the procedure, a History and Physical                            was performed, and patient medications and                            allergies were reviewed. The patient's tolerance of                            previous anesthesia was also reviewed. The risks                            and benefits of the procedure and the sedation                            options and risks were discussed with the patient.                            All questions were answered, and informed consent                            was obtained. Prior Anticoagulants: The patient                            last took Eliquis (apixaban) 3 days prior to the  procedure. ASA Grade Assessment: II - A patient                            with mild systemic disease. After reviewing the                            risks and benefits, the patient was deemed in                            satisfactory condition to undergo the procedure.                           After obtaining informed consent, the colonoscope                             was passed under direct vision. Throughout the                            procedure, the patient's blood pressure, pulse, and                            oxygen saturations were monitored continuously. The                            [id number]-[id number] ([id number]) scope was introduced through                            the anus and advanced to the the cecum, identified                            by appendiceal orifice and ileocecal valve. The                            colonoscopy was technically difficult and complex                            due to a tortuous colon. The patient tolerated the                            procedure well. The quality of the bowel                            preparation was adequate. The ileocecal valve,                            appendiceal orifice, and rectum were photographed. Scope In: 10:49:36 AM Scope Out: 11:36:51 AM Scope Withdrawal Time: 0 hours 38 minutes 37 seconds  Total Procedure Duration: 0 hours 47 minutes 15 seconds  Findings:      The perianal and digital rectal examinations were normal.      A 8 to 10 mm polyp was found in the cecum. The polyp was sessile. The       polyp was removed with a piecemeal technique using a hot snare.  Resection and retrieval were complete. Coagulation for destruction of       remaining portion of lesion using argon plasma was successful.      A small polyp was found in the proximal transverse colon. The polyp was       sessile. The polyp was removed with a cold snare. Resection and       retrieval were complete. The pathology specimen was placed into Bottle       Number 2.      There was evidence of a prior end-to-side colo-colonic anastomosis in       the distal sigmoid colon. This was patent and was characterized by       healthy appearing mucosa.      External hemorrhoids were found during retroflexion. The hemorrhoids       were small. Impression:               - One 8 to 10 mm polyp  in the cecum, removed                            piecemeal using a hot snare. Resected and                            retrieved. Treated with argon plasma coagulation                            (APC).                           - One small polyp in the proximal transverse colon,                            removed with a cold snare. Resected and retrieved.                           - Patent end-to-side colo-colonic anastomosis,                            characterized by healthy appearing mucosa.                           - External hemorrhoids. Moderate Sedation:      Moderate (conscious) sedation was administered by the endoscopy nurse       and supervised by the endoscopist. The following parameters were       monitored: oxygen saturation, heart rate, blood pressure, CO2       capnography and response to care. Total physician intraservice time was       52 minutes. Recommendation:           - Patient has a contact number available for                            emergencies. The signs and symptoms of potential                            delayed complications were discussed with the  patient. Return to normal activities tomorrow.                            Written discharge instructions were provided to the                            patient.                           - Resume previous diet today.                           - Continue present medications.                           - Resume Eliquis (apixaban) at prior dose in 5 days.                           - Await pathology results.                           - Repeat colonoscopy in 3 years for surveillance. Procedure Code(s):        --- Professional ---                           [phone], Colonoscopy, flexible; with removal of                            tumor(s), polyp(s), or other lesion(s) by snare                            technique                           99153, Moderate sedation; each additional 15                             minutes intraservice time                           99153, Moderate sedation; each additional 15                            minutes intraservice time                           G0500, Moderate sedation services provided by the                            same physician or other qualified health care                            professional performing a gastrointestinal                            endoscopic service that sedation supports,  requiring the presence of an independent trained                            observer to assist in the monitoring of the                            patient's level of consciousness and physiological                            status; initial 15 minutes of intra-service time;                            patient age 43 years or older (additional time may                            be reported with [phone], as appropriate) Diagnosis Code(s):        --- Professional ---                           Z86.010, Personal history of colonic polyps                           Z85.038, Personal history of other malignant                            neoplasm of large intestine                           D12.0, Benign neoplasm of cecum                           D12.3, Benign neoplasm of transverse colon (hepatic                            flexure or splenic flexure)                           Z98.0, Intestinal bypass and anastomosis status                           K64.4, Residual hemorrhoidal skin tags CPT copyright 2018 [employer]. All rights reserved. The codes documented in this report are preliminary and upon coder review may  be revised to meet current compliance requirements. Hildred Laser, MD Hildred Laser, MD 07/06/2018 12:00:31 PM This report has been signed electronically. Number of Addenda: 0

## 2018-07-06 NOTE — H&P (Signed)
[name] is an 77 y.o. female.   Chief Complaint: Patient is here for EGD and colonoscopy. HPI: Patient is 78 year old Caucasian female who has history of colon carcinoma in the sigmoid colon resection and 2006.  Her last colonoscopy was in December 2018 with removal of 5 large flat cecal tubular adenoma.  She is here for treatment for surveillance colonoscopy.  In the meantime she has been losing weight.  She has lost close to 14 pounds in the last 5 months.  She states she eats more than her husband does.  She states she has a good appetite.  Occasionally she may feel full but will go back and eat again within an hour.  She wakes up with nausea.  As the day progresses nausea gets better.  She has not had any vomiting.  She has occasional pill dysphagia.  She does not have any difficulty with food.  Heartburn is well controlled.  She denies melena or rectal bleeding diarrhea or constipation.  CBC has been unremarkable except mildly elevated MCV.  B12 level was normal.  3 panel has been normal.  TSH and serum cortisol levels are also normal.  Abdominopelvic CT revealed gastric wall thickening.  H she had PET scan last week which revealed 2 separate areas of hypermetabolic foci along the lower aspect of midline scar.  There was no hyper metabolic adenopathy.  Significance of this abnormality unknown.  Last EGD was in June 2019.  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  Past Medical History:  Diagnosis Date  . Adenocarcinoma, colon ([facility]) 03/2005   Stage I; resected in 03/2005  . Arthritis   . Colon cancer ([place])    Removed surgicaly   . Gastroesophageal reflux disease   . Gastroparesis   . Hearing loss    mild  . Hyperlipidemia   . Irregular heartbeat   . Ovarian cyst 2008  . Paroxysmal atrial fibrillation ([facility])    Palpitations; maintained on flecainide; -normal coronary angiography and 1999; negative stress nuclear  study in 11/2005  . PONV (postoperative nausea and vomiting)   . Tobacco abuse, in remission    10-pack-years; quit in 2001    Past Surgical History:  Procedure Laterality Date  . ABDOMINAL HYSTERECTOMY  1983   partial  . BACK SURGERY    . BIOPSY  10/18/2017   Procedure: BIOPSY;  Surgeon: [name], MD;  [facility];  Service: Endoscopy;;  gastric  . CHOLECYSTECTOMY    . COLON SURGERY  2006   Colon cancer  . COLONOSCOPY  03/25/2012   [name], MD  . COLONOSCOPY N/A 04/26/2017   Procedure: COLONOSCOPY;  Surgeon: [name], MD;  [facility];  Service: Endoscopy;  Laterality: N/A;  2:00  . COSMETIC SURGERY  09/2007  . ESOPHAGOGASTRODUODENOSCOPY N/A 10/18/2017   Procedure: ESOPHAGOGASTRODUODENOSCOPY (EGD);  Surgeon: [name], MD;  [facility];  Service: Endoscopy;  Laterality: N/A;  . PARTIAL COLECTOMY  2006   adenocarcinoma  . POLYPECTOMY  04/26/2017   Procedure: POLYPECTOMY;  Surgeon: [name], MD;  [facility];  Service: Endoscopy;;  colon  . TONSILLECTOMY      Family History  Problem Relation Age of Onset  . Heart disease Mother   . Stroke Mother   . Heart attack Mother   . Cancer Father        lung  . Heart disease  Father   . Hypertension Father   . Cancer Brother        rectal  . Healthy Son   . Heart failure Sister   . Stroke Sister    Social History:  reports that she quit smoking about 16 years ago. She started smoking about 55 years ago. She has a 12.00 pack-year smoking history. She has never used smokeless tobacco. She reports that she does not drink alcohol or use drugs.  Allergies: No Known Allergies  Medications Prior to Admission  Medication Sig Dispense Refill  . Calcium Carb-Cholecalciferol (CALCIUM 1000 + D PO) Take 1 tablet by mouth daily.    Marland Kitchen ELIQUIS 5 MG TABS tablet TAKE ONE TABLET BY MOUTH TWICE A DAY (Patient taking differently: Take 5 mg by mouth 2 (two) times  daily. ) 60 tablet 3  . flecainide (TAMBOCOR) 100 MG tablet TAKE ONE TABLET EVERY 12 HOURS (Patient taking differently: Take 100 mg by mouth 2 (two) times daily. ) 180 tablet 3  . gabapentin (NEURONTIN) 100 MG capsule Take 200 mg by mouth daily.   2  . HYDROcodone-acetaminophen (NORCO) 10-325 MG tablet Take 1 tablet by mouth every 6 (six) hours as needed for moderate pain.     Marland Kitchen lactulose (CHRONULAC) 10 GM/15ML solution Take 20 g by mouth at bedtime.    Marland Kitchen MEGARED OMEGA-3 KRILL OIL 500 MG CAPS Take 500 mg by mouth daily.     Marland Kitchen omeprazole (PRILOSEC) 20 MG capsule Take 20 mg by mouth daily before breakfast.     . prochlorperazine (COMPAZINE) 10 MG tablet Take 1 tablet (10 mg total) by mouth every 6 (six) hours as needed for nausea or vomiting. 60 tablet 0  . rosuvastatin (CRESTOR) 40 MG tablet TAKE ONE (1) TABLET BY MOUTH EVERY DAY (Patient taking differently: Take 40 mg by mouth daily. TAKE ONE (1) TABLET BY MOUTH EVERY DAY) 90 tablet 1  . vitamin B-12 (CYANOCOBALAMIN) 1000 MCG tablet Take 1,000 mcg by mouth daily.      No results found for this or any previous visit (from the past 48 hour(s)). No results found.  ROS  Blood pressure (!) 142/68, pulse 70, temperature 97.8 F (36.6 C), temperature source Oral, resp. rate 17, height 5\' 2"  (1.575 m), weight 46.7 kg, SpO2 100 %. Physical Exam  Constitutional:  Well-developed thin Caucasian female in NAD.  HENT:  Mouth/Throat: Oropharynx is clear and moist.  Eyes: Conjunctivae are normal. No scleral icterus.  Neck: No thyromegaly present.  Cardiovascular: Normal rate, regular rhythm and normal heart sounds.  No murmur heard. Respiratory: Effort normal and breath sounds normal.  GI:  Abdomen is symmetrical with lower midline scar.  There is focal area of induration to the right of the scar inferiorly.  Abdomen is otherwise soft and nontender with organomegaly or masses.  Musculoskeletal:        General: No edema.  Neurological: She is alert.   Skin: Skin is warm and dry.     Assessment/Plan Nausea and weight loss. Abnormal CT revealing thickening to gastric wall. History of colon carcinoma and polyps. Diagnostic EGD and surveillance colonoscopy.  Hildred Laser, MD 07/06/2018, 10:16 AM

## 2018-07-08 ENCOUNTER — Encounter (HOSPITAL_COMMUNITY): Payer: Self-pay | Admitting: Internal Medicine

## 2018-07-11 ENCOUNTER — Encounter (HOSPITAL_COMMUNITY): Payer: Self-pay

## 2018-07-11 ENCOUNTER — Ambulatory Visit (HOSPITAL_COMMUNITY)
Admission: RE | Admit: 2018-07-11 | Discharge: 2018-07-11 | Disposition: A | Discharge status: Home / Self Care | Payer: Medicare Other | Source: Ambulatory Visit | Attending: Family Medicine | Admitting: Family Medicine

## 2018-07-11 DIAGNOSIS — Z1231 Encounter for screening mammogram for malignant neoplasm of breast: Secondary | ICD-10-CM | POA: Diagnosis not present

## 2018-07-21 DIAGNOSIS — M47816 Spondylosis without myelopathy or radiculopathy, lumbar region: Secondary | ICD-10-CM | POA: Diagnosis not present

## 2018-07-25 ENCOUNTER — Other Ambulatory Visit: Payer: Self-pay

## 2018-07-25 ENCOUNTER — Encounter: Payer: Self-pay | Admitting: Family Medicine

## 2018-07-25 ENCOUNTER — Ambulatory Visit (INDEPENDENT_AMBULATORY_CARE_PROVIDER_SITE_OTHER): Payer: Medicare Other | Admitting: Family Medicine

## 2018-07-25 VITALS — BP 114/64 | Wt 103.8 lb

## 2018-07-25 DIAGNOSIS — K3189 Other diseases of stomach and duodenum: Secondary | ICD-10-CM | POA: Diagnosis not present

## 2018-07-25 DIAGNOSIS — R110 Nausea: Secondary | ICD-10-CM

## 2018-07-25 NOTE — Progress Notes (Signed)
   Subjective:    Patient ID: [name], female    DOB: December 16, 1940, 78 y.o.   MRN: [id number]  HPI Pt here today for 2 month follow up. Pt seen in January for chronic nausea. Pt states she is doing better, nausea comes and goes, not everyday. Pt states her fatigue has gotten better.  She relates that she is gone through a lot of testing colonoscopy endoscopy she is actually doing a lot better she denies any other particular troubles.  Review of Systems  Constitutional: Negative for activity change, appetite change and fatigue.  HENT: Negative for congestion and rhinorrhea.   Respiratory: Negative for cough and shortness of breath.   Cardiovascular: Negative for chest pain and leg swelling.  Gastrointestinal: Negative for abdominal pain and diarrhea.  Endocrine: Negative for polydipsia and polyphagia.  Skin: Negative for color change.  Neurological: Negative for dizziness and weakness.  Psychiatric/Behavioral: Negative for behavioral problems and confusion.       Objective:   Physical Exam Vitals signs reviewed.  Constitutional:      General: She is not in acute distress. HENT:     Head: Normocephalic and atraumatic.  Eyes:     General:        Right eye: No discharge.        Left eye: No discharge.  Neck:     Trachea: No tracheal deviation.  Cardiovascular:     Rate and Rhythm: Normal rate and regular rhythm.     Heart sounds: Normal heart sounds. No murmur.  Pulmonary:     Effort: Pulmonary effort is normal. No respiratory distress.     Breath sounds: Normal breath sounds.  Lymphadenopathy:     Cervical: No cervical adenopathy.  Skin:    General: Skin is warm and dry.  Neurological:     Mental Status: She is alert.     Coordination: Coordination normal.  Psychiatric:        Behavior: Behavior normal.   15 minutes was spent with patient today discussing healthcare issues which they came.  More than 50% of this visit-total duration of visit-was spent in counseling  and coordination of care.  Please see diagnosis regarding the focus of this coordination and care         Assessment & Plan:  Weight is maintaining Recent test negative for cancer Patient will continue onward with her usual progression If she has any ongoing troubles or worse she will notify us She will follow-up in 4 to 6 months  The patient is on pain medicine through Dr. Carloyn Manner for her back if she continues to need her pain medicine she can connect with [place] and we will establish her his pain medicine patient currently Dr. Carloyn Manner is prescribing but he is retiring

## 2018-08-22 ENCOUNTER — Telehealth: Payer: Self-pay | Admitting: Cardiology

## 2018-08-22 NOTE — Telephone Encounter (Signed)
Telephone apt made

## 2018-08-22 NOTE — Telephone Encounter (Signed)
She has had chest pain in the past with negative stress test last year. I can see her virtual visit tomorrow afternoon, if severe symptoms today or tonight then would need to come to ER   Lisa Abts MD

## 2018-08-22 NOTE — Telephone Encounter (Signed)
Patient called stating that she is having heaviness with her chest. States that her BP has been running low.

## 2018-08-22 NOTE — Telephone Encounter (Signed)
Virtual Visit Pre-Appointment Phone Call  Steps For Call:  1. Confirm consent - "In the setting of the current Covid19 crisis, you are scheduled for a (phone or video) visit with your provider on (date) at (time).  Just as we do with many in-office visits, in order for you to participate in this visit, we must obtain consent.  If you'd like, I can send this to your mychart (if signed up) or email for you to review.  Otherwise, I can obtain your verbal consent now.  All virtual visits are billed to your insurance company just like a normal visit would be.  By agreeing to a virtual visit, we'd like you to understand that the technology does not allow for your provider to perform an examination, and thus may limit your provider's ability to fully assess your condition.  Finally, though the technology is pretty good, we cannot assure that it will always work on either your or our end, and in the setting of a video visit, we may have to convert it to a phone-only visit.  In either situation, we cannot ensure that we have a secure connection.  Are you willing to proceed?"  2. Give patient instructions for WebEx download to smartphone as below if video visit  3. Advise patient to be prepared with any vital sign or heart rhythm information, their current medicines, and a piece of paper and pen handy for any instructions they may receive the day of their visit  4. Inform patient they will receive a phone call 15 minutes prior to their appointment time (may be from unknown caller ID) so they should be prepared to answer  5. Confirm that appointment type is correct in Epic appointment notes (video vs telephone)    TELEPHONE CALL NOTE  [name] has been deemed a candidate for a follow-up tele-health visit to limit community exposure during the Covid-19 pandemic. I spoke with the patient via phone to ensure availability of phone/video source, confirm preferred email & phone number, and discuss  instructions and expectations.  I reminded [name] to be prepared with any vital sign and/or heart rhythm information that could potentially be obtained via home monitoring, at the time of her visit. I reminded [name] to expect a phone call at the time of her visit if her visit.  Did the patient verbally acknowledge consent to treatment? YES   Chanda Busing 08/22/2018 3:28 PM   DOWNLOADING THE [place], go to [employer] and type in WebEx in the search bar. [place] [employer], the blue/green circle. The app is free but as with any other app downloads, their phone may require them to verify saved payment information or Apple password. The patient does NOT have to create an account.  - If Android, ask patient to go to [employer] and type in WebEx in the search bar. [place] [employer], the blue/green circle. The app is free but as with any other app downloads, their phone may require them to verify saved payment information or Android password. The patient does NOT have to create an account.   CONSENT FOR TELE-HEALTH VISIT - PLEASE REVIEW  I hereby voluntarily request, consent and authorize [facility] and its employed or contracted physicians, physician assistants, nurse practitioners or other licensed health care professionals (the Practitioner), to provide me with telemedicine health care services (the Services") as deemed necessary by the treating Practitioner.  I acknowledge and consent to receive the Services by the Practitioner via telemedicine. I understand that the telemedicine visit will involve communicating with the Practitioner through live audiovisual communication technology and the disclosure of certain medical information by electronic transmission. I acknowledge that I have been given the opportunity to request an in-person assessment or other available alternative prior to the telemedicine visit and am  voluntarily participating in the telemedicine visit.  I understand that I have the right to withhold or withdraw my consent to the use of telemedicine in the course of my care at any time, without affecting my right to future care or treatment, and that the Practitioner or I may terminate the telemedicine visit at any time. I understand that I have the right to inspect all information obtained and/or recorded in the course of the telemedicine visit and may receive copies of available information for a reasonable fee.  I understand that some of the potential risks of receiving the Services via telemedicine include:   Delay or interruption in medical evaluation due to technological equipment failure or disruption;  Information transmitted may not be sufficient (e.g. poor resolution of images) to allow for appropriate medical decision making by the Practitioner; and/or   In rare instances, security protocols could fail, causing a breach of personal health information.  Furthermore, I acknowledge that it is my responsibility to provide information about my medical history, conditions and care that is complete and accurate to the best of my ability. I acknowledge that Practitioner's advice, recommendations, and/or decision may be based on factors not within their control, such as incomplete or inaccurate data provided by me or distortions of diagnostic images or specimens that may result from electronic transmissions. I understand that the practice of medicine is not an exact science and that Practitioner makes no warranties or guarantees regarding treatment outcomes. I acknowledge that I will receive a copy of this consent concurrently upon execution via email to the email address I last provided but may also request a printed copy by calling the office of [facility].    I understand that my insurance will be billed for this visit.   I have read or had this consent read to me.  I understand the  contents of this consent, which adequately explains the benefits and risks of the Services being provided via telemedicine.   I have been provided ample opportunity to ask questions regarding this consent and the Services and have had my questions answered to my satisfaction.  I give my informed consent for the services to be provided through the use of telemedicine in my medical care  By participating in this telemedicine visit I agree to the above.

## 2018-08-22 NOTE — Telephone Encounter (Signed)
'  My chest feels heavy" onset for a week, comes and goes.Symptoms last for as long as she is moving around.Resolves with sitting down. She says when she gets to moving around she gets nervous . No SOB, nausea, radiation   She can tell when her heart is out of rhythm, and it is not.   Usual BP 110/70  HR runs 68-74   Says BP this week sys was 80 and felt dizzy. States she is eating and drinking ok   She feels there is something wrong with her heart

## 2018-08-23 ENCOUNTER — Telehealth (INDEPENDENT_AMBULATORY_CARE_PROVIDER_SITE_OTHER): Payer: Medicare Other | Admitting: Cardiology

## 2018-08-23 ENCOUNTER — Encounter: Payer: Self-pay | Admitting: Cardiology

## 2018-08-23 VITALS — BP 121/63 | HR 74 | Ht 63.0 in | Wt 104.0 lb

## 2018-08-23 DIAGNOSIS — I4891 Unspecified atrial fibrillation: Secondary | ICD-10-CM

## 2018-08-23 DIAGNOSIS — R42 Dizziness and giddiness: Secondary | ICD-10-CM

## 2018-08-23 DIAGNOSIS — R002 Palpitations: Secondary | ICD-10-CM

## 2018-08-23 NOTE — Progress Notes (Signed)
Medication Instructions:  Your physician recommends that you continue on your current medications as directed. Please refer to the Current Medication list given to you today.   Labwork: none  Testing/Procedures: Your physician has recommended that you wear a holter monitor. Holter monitors are medical devices that record the heart's electrical activity. Doctors most often use these monitors to diagnose arrhythmias. Arrhythmias are problems with the speed or rhythm of the heartbeat. The monitor is a small, portable device. You can wear one while you do your normal daily activities. This is usually used to diagnose what is causing palpitations/syncope (passing out). 48 hour    Follow-Up: Your physician recommends that you schedule a follow-up appointment in: 2 week     Any Other Special Instructions Will Be Listed Below (If Applicable).      If you need a refill on your cardiac medications before your next appointment, please call your pharmacy.

## 2018-08-23 NOTE — Addendum Note (Signed)
Addended by: Debbora Lacrosse R on: 08/23/2018 01:40 PM   Modules accepted: Orders

## 2018-08-23 NOTE — Progress Notes (Addendum)
Virtual Visit via Telephone Note   This visit type was conducted due to national recommendations for restrictions regarding the COVID-19 Pandemic (e.g. social distancing) in an effort to limit this patient's exposure and mitigate transmission in our community.  Due to her co-morbid illnesses, this patient is at least at moderate risk for complications without adequate follow up.  This format is felt to be most appropriate for this patient at this time.  The patient did not have access to video technology/had technical difficulties with video requiring transitioning to audio format only (telephone).  All issues noted in this document were discussed and addressed.  No physical exam could be performed with this format.  Please refer to the patient's chart for her  consent to telehealth for [facility].   Evaluation Performed:  Follow-up visit  Date:  08/23/2018   ID:  Lisa, [name] 07-08-1940, MRN [id number]  Patient Location: Home  Provider Location: Office  PCP:  Kathyrn Drown, MD  Cardiologist:  Carlyle Dolly, MD  Electrophysiologist:  None   Chief Complaint:  Chest pain  History of Present Illness:    [name] is a 78 y.o. female who presents via audio/video conferencing for a telehealth visit today.    1. Afib  - has done well on flecanidefor several years    - no palpitations, no bleeding on eliquis.   - home heart rates running around low 60s to 70s.   2. Atypical chest pain -long history, prior ischemic testing has been negative  - seen 08/2017 by PA Strader for atypical chest pain. - 08/2017 nuclear stress negative. - no recurrent symptoms.  N/V??  - no symptoms at rest - with moving around feels lightheaded, chest feels heavy with palpitations. When she sits down after some time it resolves. Comes on with exertion. - rare palpitations at rest.  - coffee 1/2 cup decaf, 1 glass of decaf tea at night, occasional juice, 1 ensure a day.  Drinks 1 glass of water a day. N/V/D have resolved - one day had bp 80/40s.   3. Hyperlipidemia - remains compliant with statin - 02/2018 TC 171 TG 164 HDL 68 LDL 70  4. Weight loss - followed by pcp and GI    SH: husband with 3 surgeries recently. She has been helping at home. Sister passed in November.  The patient does not have symptoms concerning for COVID-19 infection (fever, chills, cough, or new shortness of breath).    Past Medical History:  Diagnosis Date  . Adenocarcinoma, colon ([place]) 03/2005   Stage I; resected in 03/2005  . Arthritis   . Colon cancer ([place])    Removed surgicaly   . Gastroesophageal reflux disease   . Gastroparesis   . Hearing loss    mild  . Hyperlipidemia   . Irregular heartbeat   . Ovarian cyst 2008  . Paroxysmal atrial fibrillation (HCC)    Palpitations; maintained on flecainide; -normal coronary angiography and 1999; negative stress nuclear study in 11/2005  . PONV (postoperative nausea and vomiting)   . Tobacco abuse, in remission    10-pack-years; quit in 2001   Past Surgical History:  Procedure Laterality Date  . ABDOMINAL HYSTERECTOMY  1983   partial  . BACK SURGERY    . BIOPSY  10/18/2017   Procedure: BIOPSY;  Surgeon: [name], MD;  [facility];  Service: Endoscopy;;  gastric  . BIOPSY  07/06/2018   Procedure: BIOPSY;  Surgeon: [name],  MD;  [facility];  Service: Endoscopy;;  gastric  . CHOLECYSTECTOMY    . COLON SURGERY  2006   Colon cancer  . COLONOSCOPY  03/25/2012   [name], MD  . COLONOSCOPY N/A 04/26/2017   Procedure: COLONOSCOPY;  Surgeon: [name], MD;  [facility];  Service: Endoscopy;  Laterality: N/A;  2:00  . COLONOSCOPY N/A 07/06/2018   Procedure: COLONOSCOPY;  Surgeon: [name], MD;  [facility];  Service: Endoscopy;  Laterality: N/A;  10:30  . COSMETIC SURGERY  09/2007  . ESOPHAGOGASTRODUODENOSCOPY N/A 10/18/2017    Procedure: ESOPHAGOGASTRODUODENOSCOPY (EGD);  Surgeon: [name], MD;  [facility];  Service: Endoscopy;  Laterality: N/A;  . ESOPHAGOGASTRODUODENOSCOPY N/A 07/06/2018   Procedure: ESOPHAGOGASTRODUODENOSCOPY (EGD);  Surgeon: [name], MD;  [facility];  Service: Endoscopy;  Laterality: N/A;  . PARTIAL COLECTOMY  2006   adenocarcinoma  . POLYPECTOMY  04/26/2017   Procedure: POLYPECTOMY;  Surgeon: [name], MD;  [facility];  Service: Endoscopy;;  colon  . POLYPECTOMY  07/06/2018   Procedure: POLYPECTOMY;  Surgeon: [name], MD;  [facility];  Service: Endoscopy;;  colon   . TONSILLECTOMY       No outpatient medications have been marked as taking for the 08/23/18 encounter (Appointment) with Arnoldo Lenis, MD.     Allergies:   Patient has no known allergies.   Social History   Tobacco Use  . Smoking status: Former Smoker    Packs/day: 0.30    Years: 40.00    Pack years: 12.00    Start date: 02/23/1963    Last attempt to quit: 03/25/2002    Years since quitting: 16.4  . Smokeless tobacco: Never Used  . Tobacco comment: Quit smoking in 2001  Substance Use Topics  . Alcohol use: No    Alcohol/week: 0.0 standard drinks  . Drug use: No     Family Hx: The patient's family history includes Cancer in her brother and father; Healthy in her son; Heart attack in her mother; Heart disease in her father and mother; Heart failure in her sister; Hypertension in her father; Stroke in her mother and sister.  ROS:   Please see the history of present illness.     All other systems reviewed and are negative.   Prior CV studies:   The following studies were reviewed today:  03/2013 MPI Overall low risk exercise Cardiolite. Equivocal ST segment abnormalities were noted, mild chest pain at peak exertion, otherwise no definite arrhythmias. Perfusion imaging is consistent with breast attenuation affecting the  mid to apical anteroseptal wall, no definite ischemic defects. LVEF is 71% with normal volumes and no wall motion abnormalities.   08/2017 Lexiscan nuclear stress  There was no ST segment deviation noted during stress.  The study is normal. There are no perfusion defects consistent with prior infarct or current ischemia.  This is a low risk study.  The left ventricular ejection fraction is normal (55-65%).  Labs/Other Tests and Data Reviewed:    EKG:  na  Recent Labs: 02/08/2018: ALT 15 05/23/2018: BUN 24; Creatinine, Ser 0.90; Hemoglobin 12.6; Platelets 294; Potassium 4.3; Sodium 142; TSH 1.000   Recent Lipid Panel Lab Results  Component Value Date/Time   CHOL 171 02/08/2018 08:10 AM   TRIG 164 (H) 02/08/2018 08:10 AM   HDL 68 02/08/2018 08:10 AM   CHOLHDL 2.5 02/08/2018 08:10 AM  CHOLHDL 3.0 03/19/2014 07:12 AM   LDLCALC 70 02/08/2018 08:10 AM    Wt Readings from Last 3 Encounters:  07/25/18 103 lb 12.8 oz (47.1 kg)  07/06/18 103 lb (46.7 kg)  06/30/18 103 lb 5 oz (46.9 kg)     Objective:    Vital Signs:  p 74 bp 121/63   Normal affect, normal speech pattern and tone. Sounds comfortable in no acute distress. No auditory sounds of SOB or wheezing.  ASSESSMENT & PLAN:    1. Afib  -recent exertional lightheadedness, palpitations, and chest heaviness possibly related to afib - we will check a 48 hour holter. Symptoms really not consistent with ischemia, she has had prior atypical chest pain with negative stress tests in the past most recently 08/2017  2. Dizziness - possibly from afib - other etiologies include possibly orthostatic symptoms. She will check orthostatics at home and contact us with numbers. Encouraged better oral hydration.     F/u 2 weeks virtual visit  COVID-19 Education: The signs and symptoms of COVID-19 were discussed with the patient and how to seek care for testing (follow up with PCP or arrange E-visit).  The importance of social  distancing was discussed today.  Time:   Today, I have spent 22 minutes with the patient with telehealth technology discussing the above problems.     Medication Adjustments/Labs and Tests Ordered: Current medicines are reviewed at length with the patient today.  Concerns regarding medicines are outlined above.   Tests Ordered: No orders of the defined types were placed in this encounter.   Medication Changes: No orders of the defined types were placed in this encounter.   Disposition:  Follow up 2 weeks virutal visit  Signed, Carlyle Dolly, MD  08/23/2018 11:29 AM    [place]

## 2018-08-28 ENCOUNTER — Ambulatory Visit (INDEPENDENT_AMBULATORY_CARE_PROVIDER_SITE_OTHER): Payer: Medicare Other

## 2018-08-28 DIAGNOSIS — R002 Palpitations: Secondary | ICD-10-CM | POA: Diagnosis not present

## 2018-09-06 ENCOUNTER — Other Ambulatory Visit: Payer: Self-pay

## 2018-09-06 ENCOUNTER — Telehealth (INDEPENDENT_AMBULATORY_CARE_PROVIDER_SITE_OTHER): Payer: Medicare Other | Admitting: Cardiology

## 2018-09-06 ENCOUNTER — Encounter: Payer: Self-pay | Admitting: Cardiology

## 2018-09-06 VITALS — BP 115/62 | HR 60 | Ht 63.0 in | Wt 104.0 lb

## 2018-09-06 DIAGNOSIS — R42 Dizziness and giddiness: Secondary | ICD-10-CM

## 2018-09-06 DIAGNOSIS — R002 Palpitations: Secondary | ICD-10-CM

## 2018-09-06 NOTE — Patient Instructions (Signed)
Your physician recommends that you schedule a follow-up appointment in: 2 MONTHS WITH DR BRANCH  Your physician recommends that you continue on your current medications as directed. Please refer to the Current Medication list given to you today.  Thank you for choosing [facility]!!    

## 2018-09-06 NOTE — Progress Notes (Signed)
Virtual Visit via Telephone Note   This visit type was conducted due to national recommendations for restrictions regarding the COVID-19 Pandemic (e.g. social distancing) in an effort to limit this patient's exposure and mitigate transmission in our community.  Due to her co-morbid illnesses, this patient is at least at moderate risk for complications without adequate follow up.  This format is felt to be most appropriate for this patient at this time.  The patient did not have access to video technology/had technical difficulties with video requiring transitioning to audio format only (telephone).  All issues noted in this document were discussed and addressed.  No physical exam could be performed with this format.  Please refer to the patient's chart for her  consent to telehealth for [facility].   Evaluation Performed:  Follow-up visit  Date:  09/06/2018   ID:  Lisa, [name] 11/09/1940, MRN [id number]  Patient Location: Home Provider Location: Home  PCP:  Kathyrn Drown, MD  Cardiologist:  Carlyle Dolly, MD  Electrophysiologist:  None   Chief Complaint:  Dizziness/palpitations.   History of Present Illness:    [name] is a 78 y.o. female seen today for a focused visit for recent symptoms of palpitations and dizziness.   1. Palpitations/Dizziness - no symptoms at rest - with moving around feels lightheaded, chest feels heavy with palpitations. When she sits down after some time it resolves. Comes on with exertion. - rare palpitations at rest.  - coffee 1/2 cup decaf, 1 glass of decaf tea at night, occasional juice, 1 ensure a day. Drinks 1 glass of water a day. N/V/D have resolved - one day had bp 80/40s.   Home orthostastics Lying down: 128/59 p 56 Sitting 115/62 p 60 Standing 95/59 p 70  Standing SBP can be 70s.  - ongoing symptoms       The patient does not have symptoms concerning for COVID-19 infection (fever, chills, cough, or new shortness of  breath).    Past Medical History:  Diagnosis Date   Adenocarcinoma, colon ([place]) 03/2005   Stage I; resected in 03/2005   Arthritis    Colon cancer ([place])    Removed surgicaly    Gastroesophageal reflux disease    Gastroparesis    Hearing loss    mild   Hyperlipidemia    Irregular heartbeat    Ovarian cyst 2008   Paroxysmal atrial fibrillation (HCC)    Palpitations; maintained on flecainide; -normal coronary angiography and 1999; negative stress nuclear study in 11/2005   PONV (postoperative nausea and vomiting)    Tobacco abuse, in remission    10-pack-years; quit in 2001   Past Surgical History:  Procedure Laterality Date   ABDOMINAL HYSTERECTOMY  1983   partial   BACK SURGERY     BIOPSY  10/18/2017   Procedure: BIOPSY;  Surgeon: [name], MD;  [facility];  Service: Endoscopy;;  gastric   BIOPSY  07/06/2018   Procedure: BIOPSY;  Surgeon: [name], MD;  [facility];  Service: Endoscopy;;  gastric   CHOLECYSTECTOMY     COLON SURGERY  2006   Colon cancer   COLONOSCOPY  03/25/2012   [name], MD   COLONOSCOPY N/A 04/26/2017   Procedure: COLONOSCOPY;  Surgeon: [name], MD;  [facility];  Service: Endoscopy;  Laterality: N/A;  2:00   COLONOSCOPY N/A 07/06/2018   Procedure: COLONOSCOPY;  Surgeon: [name], MD;  [facility];  Service: Endoscopy;  Laterality: N/A;  10:30   COSMETIC SURGERY  09/2007   ESOPHAGOGASTRODUODENOSCOPY N/A 10/18/2017   Procedure: ESOPHAGOGASTRODUODENOSCOPY (EGD);  Surgeon: [name], MD;  [facility];  Service: Endoscopy;  Laterality: N/A;   ESOPHAGOGASTRODUODENOSCOPY N/A 07/06/2018   Procedure: ESOPHAGOGASTRODUODENOSCOPY (EGD);  Surgeon: [name], MD;  [facility];  Service: Endoscopy;  Laterality: N/A;   PARTIAL COLECTOMY  2006   adenocarcinoma   POLYPECTOMY  04/26/2017   Procedure: POLYPECTOMY;  Surgeon:  [name], MD;  [facility];  Service: Endoscopy;;  colon   POLYPECTOMY  07/06/2018   Procedure: POLYPECTOMY;  Surgeon: [name], MD;  [facility];  Service: Endoscopy;;  colon    TONSILLECTOMY       No outpatient medications have been marked as taking for the 09/06/18 encounter (Appointment) with Arnoldo Lenis, MD.     Allergies:   Patient has no known allergies.   Social History   Tobacco Use   Smoking status: Former Smoker    Packs/day: 0.30    Years: 40.00    Pack years: 12.00    Start date: 02/23/1963    Last attempt to quit: 03/25/2002    Years since quitting: 16.4   Smokeless tobacco: Never Used   Tobacco comment: Quit smoking in 2001  Substance Use Topics   Alcohol use: No    Alcohol/week: 0.0 standard drinks   Drug use: No     Family Hx: The patient's family history includes Cancer in her brother and father; Healthy in her son; Heart attack in her mother; Heart disease in her father and mother; Heart failure in her sister; Hypertension in her father; Stroke in her mother and sister.  ROS:   Please see the history of present illness.     All other systems reviewed and are negative.   Prior CV studies:   The following studies were reviewed today:  03/2013 MPI Overall low risk exercise Cardiolite. Equivocal ST segment abnormalities were noted, mild chest pain at peak exertion, otherwise no definite arrhythmias. Perfusion imaging is consistent with breast attenuation affecting the mid to apical anteroseptal wall, no definite ischemic defects. LVEF is 71% with normal volumes and no wall motion abnormalities.   08/2017 Lexiscan nuclear stress  There was no ST segment deviation noted during stress.  The study is normal. There are no perfusion defects consistent with prior infarct or current ischemia.  This is a low risk study.  The left ventricular ejection fraction is normal (55-65%).  Labs/Other Tests  and Data Reviewed:    EKG:  na  Recent Labs: 02/08/2018: ALT 15 05/23/2018: BUN 24; Creatinine, Ser 0.90; Hemoglobin 12.6; Platelets 294; Potassium 4.3; Sodium 142; TSH 1.000   Recent Lipid Panel Lab Results  Component Value Date/Time   CHOL 171 02/08/2018 08:10 AM   TRIG 164 (H) 02/08/2018 08:10 AM   HDL 68 02/08/2018 08:10 AM   CHOLHDL 2.5 02/08/2018 08:10 AM   CHOLHDL 3.0 03/19/2014 07:12 AM   LDLCALC 70 02/08/2018 08:10 AM    Wt Readings from Last 3 Encounters:  08/23/18 104 lb (47.2 kg)  07/25/18 103 lb 12.8 oz (47.1 kg)  07/06/18 103 lb (46.7 kg)     Objective:    Vital Signs:   Home orthostastics Lying down: 128/59 p 56 Sitting 115/62 p 60 Standing 95/59 p 70   ASSESSMENT & PLAN:    1. Dizziness/palpitations/Orthostatic dizziness - overall benign  monitor findings - significantly orthostatic by home numnbers, SBP drops 33 points with standing - discussed increased oral hydration and sodium intake. If refractory consider compressoin stockings, potentially medical therapy with florinef or midodrine if not improved with conversvative measures     COVID-19 Education: The signs and symptoms of COVID-19 were discussed with the patient and how to seek care for testing (follow up with PCP or arrange E-visit).  The importance of social distancing was discussed today.  Time:   Today, I have spent 16 minutes with the patient with telehealth technology discussing the above problems.     Medication Adjustments/Labs and Tests Ordered: Current medicines are reviewed at length with the patient today.  Concerns regarding medicines are outlined above.   Tests Ordered: No orders of the defined types were placed in this encounter.   Medication Changes: No orders of the defined types were placed in this encounter.   Disposition:  Follow up 2 months  Signed, Carlyle Dolly, MD  09/06/2018 1:00 PM    [place]

## 2018-09-21 ENCOUNTER — Other Ambulatory Visit: Payer: Self-pay | Admitting: Cardiology

## 2018-09-21 NOTE — Telephone Encounter (Signed)
77yo, 47.2kg, Scr 0.9 on 05/23/18 Last OV 09/06/18 Indication afib

## 2018-10-01 ENCOUNTER — Other Ambulatory Visit (INDEPENDENT_AMBULATORY_CARE_PROVIDER_SITE_OTHER): Payer: Self-pay | Admitting: Internal Medicine

## 2018-10-18 ENCOUNTER — Other Ambulatory Visit: Payer: Self-pay | Admitting: Family Medicine

## 2018-10-18 ENCOUNTER — Other Ambulatory Visit: Payer: Self-pay | Admitting: Cardiology

## 2018-11-03 ENCOUNTER — Telehealth: Payer: Self-pay | Admitting: Family Medicine

## 2018-11-03 ENCOUNTER — Other Ambulatory Visit: Payer: Self-pay | Admitting: Family Medicine

## 2018-11-03 NOTE — Telephone Encounter (Signed)
1.  Please tell the patient yes I did agree that we can prescribe this  #2 I have to show that I am taking care of this issue  #3 in order to do that we will need to do a virtual office visit  #4 if the patient does not have time for virtual office visit tomorrow we can set this up for next week and as long as we verify the medication she is on with her pharmacy and the dose I could send in 1 week's worth to cover until we have the office visit-if I have plenty of appointments we can do that tomorrow but if I pressed on the schedule I would recommend next week so in summary-she would have to be established as a pain medicine patient in order for [place] to start doing this  Please discuss all this with the patient figure out what we need to do thank you

## 2018-11-03 NOTE — Telephone Encounter (Signed)
Discussed with patient. Patient states she was fine to do virtual visit next week with Dr Nicki Reaper to establish pain management. Virtual office visit scheduled with Dr Nicki Reaper next week.

## 2018-11-03 NOTE — Telephone Encounter (Signed)
Last office note stated if patient needs ongoing pain medication after Dr Rex Kras retirement she would need to set up an appt to establish [place] as her pain management doctor.  Does this need to be face to face or virtual and can it be tomorrow as patient is currently in need of medication  Please advise

## 2018-11-03 NOTE — Telephone Encounter (Signed)
Pt states at last OV Dr. Nicki Reaper was going to take over prescribing her pain medicine due to Dr. Carloyn Manner retired  HYDROcodone-acetaminophen [facility]) 10-325 MG tablet  Pt states this was not sent to pharmacy, please advise & call pt     [place]

## 2018-11-07 ENCOUNTER — Ambulatory Visit (HOSPITAL_COMMUNITY): Payer: Medicare Other | Admitting: Hematology

## 2018-11-08 ENCOUNTER — Other Ambulatory Visit: Payer: Self-pay

## 2018-11-08 ENCOUNTER — Ambulatory Visit (INDEPENDENT_AMBULATORY_CARE_PROVIDER_SITE_OTHER): Payer: Medicare Other | Admitting: Family Medicine

## 2018-11-08 DIAGNOSIS — G8929 Other chronic pain: Secondary | ICD-10-CM | POA: Diagnosis not present

## 2018-11-08 DIAGNOSIS — M5440 Lumbago with sciatica, unspecified side: Secondary | ICD-10-CM

## 2018-11-08 DIAGNOSIS — G894 Chronic pain syndrome: Secondary | ICD-10-CM | POA: Diagnosis not present

## 2018-11-08 MED ORDER — HYDROCODONE-ACETAMINOPHEN 10-325 MG PO TABS: ORAL_TABLET | ORAL | 0 refills | Status: DC

## 2018-11-08 NOTE — Progress Notes (Signed)
Subjective:    Patient ID: [name], female    DOB: 1940-05-28, 78 y.o.   MRN: [id number] Video visit HPI Patient arrives to establish pain management Patient has chronic low back pain She is tried anti-inflammatory stretching exercises surgery and injections nothing is helped much.  She does take pain medicine she has been on this for a long span of time by her for more neurosurgeon Dr. Carloyn Manner she takes 2 or 3/day does not abuse and drug registry was checked the pain medicine does help her function and she is aware of the potential addictive nature of this she is very respectful of her prescriptions This patient was seen today for chronic pain  The medication list was reviewed and updated.   -Compliance with medication: yes  - Number patient states they take daily: 2-3  -when was the last dose patient took? today  The patient was advised the importance of maintaining medication and not using illegal substances with these.  Here for refills and follow up  The patient was educated that we can provide 3 monthly scripts for their medication, it is their responsibility to follow the instructions.  Side effects or complications from medications: none  Patient is aware that pain medications are meant to minimize the severity of the pain to allow their pain levels to improve to allow for better function. They are aware of that pain medications cannot totally remove their pain.  Patient states she is having a lot of pain in both of her hips.   Virtual Visit via Video Note  I connected with [name] on 11/08/18 at  2:00 PM EDT by a video enabled telemedicine application and verified that I am speaking with the correct person using two identifiers.  Location: Patient: home Provider: office   I discussed the limitations of evaluation and management by telemedicine and the availability of in person appointments. The patient expressed understanding and agreed to proceed.   History of Present Illness:    Observations/Objective:   Assessment and Plan:   Follow Up Instructions:    I discussed the assessment and treatment plan with the patient. The patient was provided an opportunity to ask questions and all were answered. The patient agreed with the plan and demonstrated an understanding of the instructions.   The patient was advised to call back or seek an in-person evaluation if the symptoms worsen or if the condition fails to improve as anticipated.  I provided 15 minutes of non-face-to-face time during this encounter.             Review of Systems  Constitutional: Negative for activity change and appetite change.  HENT: Negative for congestion and rhinorrhea.   Respiratory: Negative for cough and shortness of breath.   Cardiovascular: Negative for chest pain and leg swelling.  Gastrointestinal: Negative for abdominal pain, nausea and vomiting.  Musculoskeletal: Positive for arthralgias and back pain.  Skin: Negative for color change.  Neurological: Negative for dizziness and weakness.  Psychiatric/Behavioral: Negative for agitation and confusion.       Objective:   Physical Exam Patient had virtual visit Appears to be in no distress Atraumatic Neuro able to relate and oriented No apparent resp distress Color normal       Assessment & Plan:  The patient was seen in followup for chronic pain. A review over at their current pain status was discussed. Drug registry was checked. Prescriptions were given. Discussion was held regarding the importance of compliance with medication  as well as pain medication contract.  Time for questions regarding pain management plan occurred. Importance of regular followup visits was discussed. Patient was informed that medication may cause drowsiness and should not be combined  with other medications/alcohol or street drugs. Patient was cautioned that medication could cause drowsiness. If the  patient feels medication is causing altered alertness then do not drive or operate dangerous equipment.  3 prescriptions were sent into her pharmacy  Pain management plan Patient may use massage and stretches on a regular basis If she feels she is getting worse we will set her up with neurosurgery for possibility of injections Patient may use her pain medicine no more than 3 times per day 3 prescriptions were sent in Follow-up 3 months

## 2018-11-10 ENCOUNTER — Ambulatory Visit: Payer: Medicare Other | Admitting: Cardiology

## 2018-11-15 DIAGNOSIS — M25511 Pain in right shoulder: Secondary | ICD-10-CM | POA: Diagnosis not present

## 2018-11-24 ENCOUNTER — Ambulatory Visit: Payer: Medicare Other | Admitting: Family Medicine

## 2018-11-28 ENCOUNTER — Ambulatory Visit: Payer: Medicare Other | Admitting: Family Medicine

## 2018-12-01 ENCOUNTER — Telehealth: Payer: Self-pay | Admitting: Family Medicine

## 2018-12-01 NOTE — Telephone Encounter (Signed)
Pt is requesting Dr. Nicki Reaper call in her c-dicl/gabap/lido. It is a gabapentin cream for her foot. The doctor who use to prescribe it has retired and she would like a refill on it and was told she would need to call her PCP.   [place], [place] - [address]  Pt is aware Dr. Nicki Reaper is out of the office until tomorrow.

## 2018-12-01 NOTE — Telephone Encounter (Signed)
Please advise. Thank you

## 2018-12-01 NOTE — Telephone Encounter (Signed)
I suppose based on this message that the patient had this filled at [facility] drug? Call [facility] if it is as simple as requesting refills go ahead and give 3 refills And somehow put it on epic medication list

## 2018-12-02 ENCOUNTER — Encounter: Payer: Self-pay | Admitting: *Deleted

## 2018-12-02 NOTE — Telephone Encounter (Signed)
Refills called into [facility]. Left message to return call to notify pt

## 2018-12-02 NOTE — Telephone Encounter (Signed)
Contacted [facility] and they have filled the cream for her. It is a 1% 5% 5% cream with diclofenac, gabapentin and lidocaine.

## 2018-12-09 ENCOUNTER — Encounter: Payer: Self-pay | Admitting: Cardiology

## 2018-12-09 ENCOUNTER — Ambulatory Visit (INDEPENDENT_AMBULATORY_CARE_PROVIDER_SITE_OTHER): Payer: Medicare Other | Admitting: Cardiology

## 2018-12-09 ENCOUNTER — Other Ambulatory Visit: Payer: Self-pay

## 2018-12-09 VITALS — BP 122/63 | HR 68 | Temp 97.4°F | Ht 62.0 in | Wt 100.0 lb

## 2018-12-09 DIAGNOSIS — I4891 Unspecified atrial fibrillation: Secondary | ICD-10-CM | POA: Diagnosis not present

## 2018-12-09 DIAGNOSIS — E782 Mixed hyperlipidemia: Secondary | ICD-10-CM | POA: Diagnosis not present

## 2018-12-09 DIAGNOSIS — R002 Palpitations: Secondary | ICD-10-CM

## 2018-12-09 DIAGNOSIS — R0789 Other chest pain: Secondary | ICD-10-CM | POA: Diagnosis not present

## 2018-12-09 DIAGNOSIS — R42 Dizziness and giddiness: Secondary | ICD-10-CM | POA: Diagnosis not present

## 2018-12-09 NOTE — Patient Instructions (Signed)
Medication Instructions:  Your physician recommends that you continue on your current medications as directed. Please refer to the Current Medication list given to you today.  If you need a refill on your cardiac medications before your next appointment, please call your pharmacy.   Lab work: NONE   If you have labs (blood work) drawn today and your tests are completely normal, you will receive your results only by: . MyChart Message (if you have MyChart) OR . A paper copy in the mail If you have any lab test that is abnormal or we need to change your treatment, we will call you to review the results.  Testing/Procedures: NONE   Follow-Up: At [facility], you and your health needs are our priority.  As part of our continuing mission to provide you with exceptional heart care, we have created designated Provider Care Teams.  These Care Teams include your primary Cardiologist (physician) and Advanced Practice Providers (APPs -  Physician Assistants and Nurse Practitioners) who all work together to provide you with the care you need, when you need it. You will need a follow up appointment in 6 months.  Please call our office 2 months in advance to schedule this appointment.  You may see Branch, Jonathan, MD or one of the following Advanced Practice Providers on your designated Care Team:   Brittany Strader, PA-C ([place] Office) . Michele Lenze, PA-C ([place] Office)  Any Other Special Instructions Will Be Listed Below (If Applicable). Thank you for choosing [facility]!     

## 2018-12-09 NOTE — Progress Notes (Signed)
Clinical Summary Ms. Ballinas is a 78 y.o.female seen today for a focused visit for recent symptoms of palpitations and dizziness.   1. Palpitations/Dizziness - no symptoms at rest - with moving around feels lightheaded, chest feels heavy with palpitations. When she sits down after some time it resolves. Comes on with exertion. - rare palpitations at rest.  - coffee 1/2 cup decaf, 1 glass of decaf tea at night, occasional juice, 1 ensure a day. Drinks 1 glass of water a day. N/V/D have resolved - one day had bp 80/40s.  Home orthostastics Lying down: 128/59 p 56 Sitting 115/62 p 60 Standing 95/59 p 70  Standing SBP can be 70s.    08/2018 monitor: rare supraventricular and ventricular ectopy. 3 very short runs of atach longest 4 beats. No afib  - no recent dizziness. Improved with increased hydration  2. Afib  - has done well on flecanidefor several years - 08/2018 monitor without afib  - no recent symptoms    3. Atypical chest pain -long history, prior ischemic testing has been negative  - seen 08/2017 by PA Strader for atypical chest pain. - 08/2017 nuclear stress negative. -no recent symptoms  4. Hyperlipidemia - compliant with statin  5. Weight loss - followed by pcp and GI  -normal TSH earlier this year Past Medical History:  Diagnosis Date  . Adenocarcinoma, colon ([place]) 03/2005   Stage I; resected in 03/2005  . Arthritis   . Colon cancer ([place])    Removed surgicaly   . Gastroesophageal reflux disease   . Gastroparesis   . Hearing loss    mild  . Hyperlipidemia   . Irregular heartbeat   . Ovarian cyst 2008  . Paroxysmal atrial fibrillation ([facility])    Palpitations; maintained on flecainide; -normal coronary angiography and 1999; negative stress nuclear study in 11/2005  . PONV (postoperative nausea and vomiting)   . Tobacco abuse, in remission    10-pack-years; quit in 2001     No Known Allergies   Current Outpatient Medications   Medication Sig Dispense Refill  . Calcium Carb-Cholecalciferol (CALCIUM 1000 + D PO) Take 1 tablet by mouth daily.    Marland Kitchen ELIQUIS 5 MG TABS tablet TAKE ONE TABLET BY MOUTH TWICE A DAY 60 tablet 5  . famotidine (PEPCID) 20 MG tablet TAKE ONE TABLET BY MOUTH EVERY NIGHT AT BEDTIME 90 tablet 0  . flecainide (TAMBOCOR) 100 MG tablet TAKE ONE (1) TABLET BY MOUTH EVERY 12 HOURS 180 tablet 3  . HYDROcodone-acetaminophen (NORCO) 10-325 MG tablet One tablet TID PRN Pain 90 tablet 0  . HYDROcodone-acetaminophen (NORCO) 10-325 MG tablet One tablet TID PRN Pain 90 tablet 0  . HYDROcodone-acetaminophen (NORCO) 10-325 MG tablet One tablet TID PRN Pain 90 tablet 0  . lactulose (CHRONULAC) 10 GM/15ML solution Take 20 g by mouth at bedtime.    Marland Kitchen MEGARED OMEGA-3 KRILL OIL 500 MG CAPS Take 500 mg by mouth daily.     . NONFORMULARY OR COMPOUNDED ITEM Compounded med from [facility] C/diclofenac/gabapentin/lido 1%,5%,5% 100grams    . omeprazole (PRILOSEC) 20 MG capsule Take 20 mg by mouth daily before breakfast.     . prochlorperazine (COMPAZINE) 10 MG tablet Take 1 tablet (10 mg total) by mouth every 6 (six) hours as needed for nausea or vomiting. 60 tablet 0  . rosuvastatin (CRESTOR) 40 MG tablet TAKE ONE (1) TABLET BY MOUTH EVERY DAY 90 tablet 1  . vitamin B-12 (CYANOCOBALAMIN) 1000 MCG tablet Take 1,000 mcg  by mouth daily.     No current facility-administered medications for this visit.      Past Surgical History:  Procedure Laterality Date  . ABDOMINAL HYSTERECTOMY  1983   partial  . BACK SURGERY    . BIOPSY  10/18/2017   Procedure: BIOPSY;  Surgeon: [name], MD;  [facility];  Service: Endoscopy;;  gastric  . BIOPSY  07/06/2018   Procedure: BIOPSY;  Surgeon: [name], MD;  [facility];  Service: Endoscopy;;  gastric  . CHOLECYSTECTOMY    . COLON SURGERY  2006   Colon cancer  . COLONOSCOPY  03/25/2012   [name], MD  . COLONOSCOPY N/A 04/26/2017    Procedure: COLONOSCOPY;  Surgeon: [name], MD;  [facility];  Service: Endoscopy;  Laterality: N/A;  2:00  . COLONOSCOPY N/A 07/06/2018   Procedure: COLONOSCOPY;  Surgeon: [name], MD;  [facility];  Service: Endoscopy;  Laterality: N/A;  10:30  . COSMETIC SURGERY  09/2007  . ESOPHAGOGASTRODUODENOSCOPY N/A 10/18/2017   Procedure: ESOPHAGOGASTRODUODENOSCOPY (EGD);  Surgeon: [name], MD;  [facility];  Service: Endoscopy;  Laterality: N/A;  . ESOPHAGOGASTRODUODENOSCOPY N/A 07/06/2018   Procedure: ESOPHAGOGASTRODUODENOSCOPY (EGD);  Surgeon: [name], MD;  [facility];  Service: Endoscopy;  Laterality: N/A;  . PARTIAL COLECTOMY  2006   adenocarcinoma  . POLYPECTOMY  04/26/2017   Procedure: POLYPECTOMY;  Surgeon: [name], MD;  [facility];  Service: Endoscopy;;  colon  . POLYPECTOMY  07/06/2018   Procedure: POLYPECTOMY;  Surgeon: [name], MD;  [facility];  Service: Endoscopy;;  colon   . TONSILLECTOMY       No Known Allergies    Family History  Problem Relation Age of Onset  . Heart disease Mother   . Stroke Mother   . Heart attack Mother   . Cancer Father        lung  . Heart disease Father   . Hypertension Father   . Cancer Brother        rectal  . Healthy Son   . Heart failure Sister   . Stroke Sister      Social History Ms. Stanko reports that she quit smoking about 16 years ago. She started smoking about 55 years ago. She has a 12.00 pack-year smoking history. She has never used smokeless tobacco. Ms. Corral reports no history of alcohol use.   Review of Systems CONSTITUTIONAL: No weight loss, fever, chills, weakness or fatigue.  HEENT: Eyes: No visual loss, blurred vision, double vision or yellow sclerae.No hearing loss, sneezing, congestion, runny nose or sore throat.  SKIN: No rash or itching.  CARDIOVASCULAR: per hpi RESPIRATORY: No shortness of  breath, cough or sputum.  GASTROINTESTINAL: No anorexia, nausea, vomiting or diarrhea. No abdominal pain or blood.  GENITOURINARY: No burning on urination, no polyuria NEUROLOGICAL: No headache, dizziness, syncope, paralysis, ataxia, numbness or tingling in the extremities. No change in bowel or bladder control.  MUSCULOSKELETAL: No muscle, back pain, joint pain or stiffness.  LYMPHATICS: No enlarged nodes. No history of splenectomy.  PSYCHIATRIC: No history of depression or anxiety.  ENDOCRINOLOGIC: No reports of sweating, cold or heat intolerance. No polyuria or polydipsia.  Marland Kitchen   Physical Examination Today's Vitals   12/09/18 1348  BP: 122/63  Pulse: 68  Temp: (!) 97.4 F (36.3 C)  TempSrc: Temporal  SpO2: 98%  Weight:  100 lb (45.4 kg)  Height: 5\' 2"  (1.575 m)   Body mass index is 18.29 kg/m.  Gen: resting comfortably, no acute distress HEENT: no scleral icterus, pupils equal round and reactive, no palptable cervical adenopathy,  CV: RRR, no m/r/g no jvd Resp: Clear to auscultation bilaterally GI: abdomen is soft, non-tender, non-distended, normal bowel sounds, no hepatosplenomegaly MSK: extremities are warm, no edema.  Skin: warm, no rash Neuro:  no focal deficits Psych: appropriate affect   Diagnostic Studies  03/2013 MPI Overall low risk exercise Cardiolite. Equivocal ST segment abnormalities were noted, mild chest pain at peak exertion, otherwise no definite arrhythmias. Perfusion imaging is consistent with breast attenuation affecting the mid to apical anteroseptal wall, no definite ischemic defects. LVEF is 71% with normal volumes and no wall motion abnormalities.   08/2017 Lexiscan nuclear stress  There was no ST segment deviation noted during stress.  The study is normal. There are no perfusion defects consistent with prior infarct or current ischemia.  This is a low risk study.  The left ventricular ejection fraction is normal (55-65%).   08/2018  48 hr holter  48 hr holter monitor  Min HR 50, Max HR 103, Avg HR 68  Rare ventricular ectopy, 2 isolated PVCs  Rare supraventricular ectopy. Primarily PACs. Three short runs of atach, longest 4 beats.  Reported symptoms correlated with sinus rhythm  Assessment and Plan   1. Dizziness/palpitations/Orthostatic dizziness - overall benign monitor findings - significantly orthostatic by home numnbers - symptoms have improved since last visit, continue aggressive hydration.    2. Afib  -no symptoms, continue current meds  2. Atypical chest pain -long history, recent stress without ischemia - no recent symptoms, continue to monitor   3. Hyperlipidemia Continue statin   Arnoldo Lenis, M.D.

## 2018-12-14 ENCOUNTER — Ambulatory Visit: Payer: Medicare Other | Admitting: Cardiology

## 2018-12-14 ENCOUNTER — Ambulatory Visit (INDEPENDENT_AMBULATORY_CARE_PROVIDER_SITE_OTHER): Payer: Medicare Other | Admitting: Family Medicine

## 2018-12-14 ENCOUNTER — Other Ambulatory Visit: Payer: Self-pay

## 2018-12-14 DIAGNOSIS — R634 Abnormal weight loss: Secondary | ICD-10-CM | POA: Diagnosis not present

## 2018-12-14 DIAGNOSIS — R54 Age-related physical debility: Secondary | ICD-10-CM

## 2018-12-14 NOTE — Progress Notes (Signed)
   Subjective:    Patient ID: [name], female    DOB: [birth year], 78 y.o.   MRN: [id number] Telephone only patient unable to do video HPI Fatigue and weight loss. Started last fall. Pt states her weight is down to 98 without clothes. Pt states she has no energy.  The patient finds her self feeling fatigued tired rundown low energy.  She is tried numerous ways of eating more to try to gain weight has not been able to gain weight.  The patient has been thoroughly evaluated by oncology they find no evidence of cancer.  She has been evaluated by GI they find no evidence of cancer or malabsorption.  It is possible patient may have an underlying endocrinologic issue potentially adrenal gland we will consider discussing with endocrinology to see if they may be willing to see her to work her up further  Virtual Visit via Telephone Note  I connected with [name] on 12/14/18 at  2:00 PM EDT by telephone and verified that I am speaking with the correct person using two identifiers.  Location: Patient: home Provider: office   I discussed the limitations, risks, security and privacy concerns of performing an evaluation and management service by telephone and the availability of in person appointments. I also discussed with the patient that there may be a patient responsible charge related to this service. The patient expressed understanding and agreed to proceed.   History of Present Illness:    Observations/Objective:   Assessment and Plan:   Follow Up Instructions:    I discussed the assessment and treatment plan with the patient. The patient was provided an opportunity to ask questions and all were answered. The patient agreed with the plan and demonstrated an understanding of the instructions.   The patient was advised to call back or seek an in-person evaluation if the symptoms worsen or if the condition fails to improve as anticipated.  I provided 15 minutes of non-face-to-face  time during this encounter.      Review of Systems  Constitutional: Positive for unexpected weight change. Negative for activity change, appetite change and fatigue.  HENT: Negative for congestion and rhinorrhea.   Respiratory: Negative for cough and shortness of breath.   Cardiovascular: Negative for chest pain and leg swelling.  Gastrointestinal: Negative for abdominal pain and diarrhea.  Endocrine: Negative for polydipsia and polyphagia.  Musculoskeletal: Positive for back pain. Negative for arthralgias.  Skin: Negative for color change.  Neurological: Positive for weakness. Negative for dizziness.  Psychiatric/Behavioral: Negative for behavioral problems and confusion.       Objective:   Physical Exam Today's visit was via telephone Physical exam was not possible for this visit        Assessment & Plan:  Certainly there is concern with the patient losing weight especially is old and thin and she has it could be frailty There is a possibility though with her weakness fatigue as well as weight loss it could be adrenal insufficiency.  We will communicate with endocrinology to see if they would be willing to work her up further.  The patient is aware that it is possible that nothing may be found on this evaluation though.  The patient will do a follow-up in the fall time she is on chronic pain medicine and has 2 scripts that her pharmacy waiting currently for her

## 2019-01-02 ENCOUNTER — Other Ambulatory Visit: Payer: Self-pay | Admitting: *Deleted

## 2019-01-02 DIAGNOSIS — R1900 Intra-abdominal and pelvic swelling, mass and lump, unspecified site: Secondary | ICD-10-CM

## 2019-01-02 DIAGNOSIS — R9389 Abnormal findings on diagnostic imaging of other specified body structures: Secondary | ICD-10-CM

## 2019-01-03 ENCOUNTER — Telehealth: Payer: Self-pay | Admitting: Family Medicine

## 2019-01-03 DIAGNOSIS — R634 Abnormal weight loss: Secondary | ICD-10-CM

## 2019-01-03 MED ORDER — TRIAMCINOLONE ACETONIDE 0.1 % EX CREA: 1.0000 "application " | TOPICAL_CREAM | Freq: Two times a day (BID) | CUTANEOUS | 0 refills | Status: DC

## 2019-01-03 NOTE — Telephone Encounter (Signed)
Prescription sent electronically to pharmacy. Referral ordered in Epic. Patient notified and verbalized understanding.

## 2019-01-03 NOTE — Telephone Encounter (Signed)
While notifying pt of ultrasound appt, pt aske...  Has Dr. Nicki Reaper discussed her weight loss with endocrinology?  If being referred would like Dr. Dorris Fetch so she don't have to travel to [facility]  Also pt states she had an itchy bump on her naval, used OTC cortisone cream that helped a little but now it's a patch of bumps, kinda dry, not red, not warm  Please advise  Would like antibiotic ointment or cream called in (explained this may require virtual visit that Dr. Nicki Reaper would decide)  [place]

## 2019-01-03 NOTE — Telephone Encounter (Signed)
I recommend triamcinolone cream, small tube, apply twice daily as needed over the course of the next 2 weeks if still not doing better I recommend office visit  Go ahead with referral for weight loss possible Addison's disease with Dr. Dorris Fetch

## 2019-01-05 ENCOUNTER — Ambulatory Visit (INDEPENDENT_AMBULATORY_CARE_PROVIDER_SITE_OTHER): Payer: Medicare Other | Admitting: Otolaryngology

## 2019-01-06 ENCOUNTER — Encounter: Payer: Self-pay | Admitting: Family Medicine

## 2019-01-09 ENCOUNTER — Ambulatory Visit (HOSPITAL_COMMUNITY): Payer: Medicare Other

## 2019-01-09 ENCOUNTER — Telehealth: Payer: Self-pay | Admitting: "Endocrinology

## 2019-01-09 NOTE — Telephone Encounter (Signed)
Dr Dorris Fetch Will you review this patients OV notes from Cherly Beach, NP at [facility]. She placed a referral for " weight loss ". Let me know if you think you need to see this pt and I will call and schedule. Thanks

## 2019-01-09 NOTE — Telephone Encounter (Signed)
We can see her in consult  for work up.

## 2019-01-11 ENCOUNTER — Other Ambulatory Visit: Payer: Self-pay

## 2019-01-11 ENCOUNTER — Ambulatory Visit (HOSPITAL_COMMUNITY)
Admission: RE | Admit: 2019-01-11 | Discharge: 2019-01-11 | Disposition: A | Discharge status: Home / Self Care | Payer: Medicare Other | Source: Ambulatory Visit | Attending: Family Medicine | Admitting: Family Medicine

## 2019-01-11 DIAGNOSIS — R9389 Abnormal findings on diagnostic imaging of other specified body structures: Secondary | ICD-10-CM | POA: Diagnosis not present

## 2019-01-11 DIAGNOSIS — N838 Other noninflammatory disorders of ovary, fallopian tube and broad ligament: Secondary | ICD-10-CM | POA: Diagnosis not present

## 2019-01-11 DIAGNOSIS — R1900 Intra-abdominal and pelvic swelling, mass and lump, unspecified site: Secondary | ICD-10-CM | POA: Diagnosis not present

## 2019-01-12 ENCOUNTER — Other Ambulatory Visit: Payer: Self-pay

## 2019-01-13 ENCOUNTER — Ambulatory Visit (INDEPENDENT_AMBULATORY_CARE_PROVIDER_SITE_OTHER): Payer: Medicare Other | Admitting: Family Medicine

## 2019-01-13 DIAGNOSIS — N9489 Other specified conditions associated with female genital organs and menstrual cycle: Secondary | ICD-10-CM

## 2019-01-13 NOTE — Progress Notes (Signed)
   Subjective:    Patient ID: [name], female    DOB: 1940/10/12, 78 y.o.   MRN: [id number] Phone visit video not possible HPI Patient has had weight loss but she states her weight recently is remained stable she will be seen endocrinology in the near future for further evaluation more than likely they will pursue work-up to make sure she is not developed Addison's disease  The patient also had a CAT scan of the abdomen a while back which showed adnexal mass that they recommended an ultrasound and ultrasound showed the adnexal mass but is felt to be a normal cyst they recommended follow-up in 6 months  The new ultrasound shows a complex cyst with a possible nodule in it this was discussed with the patient in detail Patient calls to discuss the results of her recent ultrasound  Virtual Visit via Video Note  I connected with [name] on 01/13/19 at  3:00 PM EDT by a video enabled telemedicine application and verified that I am speaking with the correct person using two identifiers.  Location: Patient: home Provider: office   I discussed the limitations of evaluation and management by telemedicine and the availability of in person appointments. The patient expressed understanding and agreed to proceed.  History of Present Illness:    Observations/Objective:   Assessment and Plan:   Follow Up Instructions:    I discussed the assessment and treatment plan with the patient. The patient was provided an opportunity to ask questions and all were answered. The patient agreed with the plan and demonstrated an understanding of the instructions.   The patient was advised to call back or seek an in-person evaluation if the symptoms worsen or if the condition fails to improve as anticipated.  I provided 17 minutes of non-face-to-face time during this encounter.     Review of Systems Patient denies abdominal pain fever chills sweats vomiting diarrhea states weight stable     Objective:   Physical Exam Today's visit was via telephone Physical exam was not possible for this visit        Assessment & Plan:  Time spent with the patient discussing the results of the tests It is recommended that the patient pursue forward with doing a MRI with and without contrast of the pelvic area to look at the adnexal mass If it comes back normal we will just follow if it comes back abnormal referral to specialist patient understands Patient understands it is possible could be a cancer she does desire to go forward with the tests

## 2019-01-23 ENCOUNTER — Ambulatory Visit: Payer: Medicare Other | Admitting: "Endocrinology

## 2019-01-23 ENCOUNTER — Other Ambulatory Visit: Payer: Self-pay

## 2019-01-23 ENCOUNTER — Ambulatory Visit (INDEPENDENT_AMBULATORY_CARE_PROVIDER_SITE_OTHER): Payer: Medicare Other | Admitting: "Endocrinology

## 2019-01-23 ENCOUNTER — Encounter: Payer: Self-pay | Admitting: "Endocrinology

## 2019-01-23 VITALS — BP 119/73 | HR 71 | Temp 97.5°F | Ht 62.0 in | Wt 100.8 lb

## 2019-01-23 DIAGNOSIS — R634 Abnormal weight loss: Secondary | ICD-10-CM | POA: Insufficient documentation

## 2019-01-23 NOTE — Progress Notes (Signed)
Endocrinology Consult Note                                            01/23/2019, 4:41 PM   Subjective:    Patient ID: [name], female    DOB: 1941/02/12, PCP Kathyrn Drown, MD   Past Medical History:  Diagnosis Date  . Adenocarcinoma, colon ([place]) 03/2005   Stage I; resected in 03/2005  . Arthritis   . Colon cancer ([place])    Removed surgicaly   . Gastroesophageal reflux disease   . Gastroparesis   . Hearing loss    mild  . Hyperlipidemia   . Irregular heartbeat   . Ovarian cyst 2008  . Paroxysmal atrial fibrillation (H[facility])    Palpitations; maintained on flecainide; -normal coronary angiography and 1999; negative stress nuclear study in 11/2005  . PONV (postoperative nausea and vomiting)   . Tobacco abuse, in remission    10-pack-years; quit in 2001   Past Surgical History:  Procedure Laterality Date  . ABDOMINAL HYSTERECTOMY  1983   partial  . BACK SURGERY    . BIOPSY  10/18/2017   Procedure: BIOPSY;  Surgeon: [name], MD;  [facility];  Service: Endoscopy;;  gastric  . BIOPSY  07/06/2018   Procedure: BIOPSY;  Surgeon: [name], MD;  [facility];  Service: Endoscopy;;  gastric  . CHOLECYSTECTOMY    . COLON SURGERY  2006   Colon cancer  . COLONOSCOPY  03/25/2012   [name], MD  . COLONOSCOPY N/A 04/26/2017   Procedure: COLONOSCOPY;  Surgeon: [name], MD;  [facility];  Service: Endoscopy;  Laterality: N/A;  2:00  . COLONOSCOPY N/A 07/06/2018   Procedure: COLONOSCOPY;  Surgeon: [name], MD;  [facility];  Service: Endoscopy;  Laterality: N/A;  10:30  . COSMETIC SURGERY  09/2007  . ESOPHAGOGASTRODUODENOSCOPY N/A 10/18/2017   Procedure: ESOPHAGOGASTRODUODENOSCOPY (EGD);  Surgeon: [name], MD;  [facility];  Service: Endoscopy;  Laterality: N/A;  . ESOPHAGOGASTRODUODENOSCOPY N/A 07/06/2018   Procedure: ESOPHAGOGASTRODUODENOSCOPY (EGD);  Surgeon:  [name], MD;  [facility];  Service: Endoscopy;  Laterality: N/A;  . PARTIAL COLECTOMY  2006   adenocarcinoma  . POLYPECTOMY  04/26/2017   Procedure: POLYPECTOMY;  Surgeon: [name], MD;  [facility];  Service: Endoscopy;;  colon  . POLYPECTOMY  07/06/2018   Procedure: POLYPECTOMY;  Surgeon: [name], MD;  [facility];  Service: Endoscopy;;  colon   . TONSILLECTOMY     Social History   Socioeconomic History  . Marital status: Married    Spouse name: Not on file  . Number of children: Not on file  . Years of education: Not on file  . Highest education level: Not on file  Occupational History  . Occupation: Medical laboratory scientific officer  . Financial resource strain: Not on file  . Food insecurity    Worry: Not on file    Inability: Not on file  . Transportation needs    Medical: Not on file    Non-medical: Not on file  Tobacco Use  . Smoking status: Former Smoker    Packs/day: 0.30    Years: 40.00    Pack years: 12.00    Start date:  02/23/1963    Quit date: 03/25/2002    Years since quitting: 16.8  . Smokeless tobacco: Never Used  . Tobacco comment: Quit smoking in 2001  Substance and Sexual Activity  . Alcohol use: No    Alcohol/week: 0.0 standard drinks  . Drug use: No  . Sexual activity: Yes    Birth control/protection: Surgical  Lifestyle  . Physical activity    Days per week: Not on file    Minutes per session: Not on file  . Stress: Not on file  Relationships  . Social Herbalist on phone: Not on file    Gets together: Not on file    Attends religious service: Not on file    Active member of club or organization: Not on file    Attends meetings of clubs or organizations: Not on file    Relationship status: Not on file  Other Topics Concern  . Not on file  Social History Narrative   Lives with husband in a one story home.  Has 3 children.  Retired from Medical sales representative work.  Education: some  college.   Family History  Problem Relation Age of Onset  . Heart disease Mother   . Stroke Mother   . Heart attack Mother   . Cancer Father        lung  . Heart disease Father   . Hypertension Father   . Cancer Brother        rectal  . Healthy Son   . Heart failure Sister   . Stroke Sister    Outpatient Encounter Medications as of 01/23/2019  Medication Sig  . Calcium Carbonate (CALCIUM 600 PO) Take by mouth.  Arne Cleveland 5 MG TABS tablet TAKE ONE TABLET BY MOUTH TWICE A DAY  . flecainide (TAMBOCOR) 100 MG tablet TAKE ONE (1) TABLET BY MOUTH EVERY 12 HOURS  . HYDROcodone-acetaminophen (NORCO) 10-325 MG tablet One tablet TID PRN Pain  . lactulose (CHRONULAC) 10 GM/15ML solution Take 20 g by mouth at bedtime.  . NONFORMULARY OR COMPOUNDED ITEM Compounded med from [facility] C/diclofenac/gabapentin/lido 1%,5%,5% 100grams  . omeprazole (PRILOSEC) 20 MG capsule Take 20 mg by mouth daily before breakfast.   . rosuvastatin (CRESTOR) 40 MG tablet TAKE ONE (1) TABLET BY MOUTH EVERY DAY  . triamcinolone cream (KENALOG) 0.1 % Apply 1 application topically 2 (two) times daily. Up to 2 weeks  . vitamin B-12 (CYANOCOBALAMIN) 1000 MCG tablet Take 1,000 mcg by mouth daily.   No facility-administered encounter medications on file as of 01/23/2019.    ALLERGIES: No Known Allergies  VACCINATION STATUS: Immunization History  Administered Date(s) Administered  . Influenza Split 02/21/2013  . Influenza,inj,Quad PF,6+ Mos 03/29/2014, 03/06/2015, 03/10/2016, 03/01/2017, 02/15/2018  . Pneumococcal Conjugate-13 03/29/2014  . Pneumococcal Polysaccharide-23 03/30/2008  . Td 09/11/2014  . Zoster 03/11/2017  . Zoster Recombinat (Shingrix) 07/13/2017    HPI [name] is 78 y.o. female who presents today with a medical history as above. she is being seen in consultation for unintended weight loss requested by Kathyrn Drown, MD.  she has multiple medical problems as above.  She has observed  progressive unintended weight loss over the last 2 to 3 years dropping from her baseline weight of 110 pounds to her current level of 100 pounds, loss of 10 pounds.  She reports reasonable appetite, confirmed by her husband accompanied her to the clinic and that she eats as much as he does.  He does not have nausea,  vomiting.  Did have a course  of on and off diarrhea/loose stool for several months before he stopped weeks ago.  She is a survivor of colon cancer, currently being worked up for ovarian cyst.   She denies any prior history of thyroid, adrenal dysfunctions.  She does not have diabetes.  She had a random cortisol level of low normal at 7.7 about a week ago.  She is a former smoker, not diagnosed with COPD.  She denies exposure to heavy dose steroids on a chronic basis.    Review of Systems  Constitutional:  + recent weight loss, + fatigue, no subjective hyperthermia, no subjective hypothermia Eyes: no blurry vision, no xerophthalmia ENT: no sore throat, no nodules palpated in throat, no dysphagia/odynophagia, no hoarseness Cardiovascular: no Chest Pain, no Shortness of Breath, no palpitations, no leg swelling Respiratory: no cough, no shortness of breath Gastrointestinal: no Nausea/Vomiting/Diarhhea Musculoskeletal: no muscle/joint aches Skin: no rashes Neurological: no tremors, no numbness, no tingling, no dizziness Psychiatric: no depression, no anxiety  Objective:    BP 119/73 (BP Location: Right Arm, Patient Position: Sitting, Cuff Size: Small)   Pulse 71   Temp (!) 97.5 F (36.4 C) (Oral)   Ht 5\' 2"  (1.575 m)   Wt 100 lb 12.8 oz (45.7 kg)   SpO2 100%   BMI 18.44 kg/m   Wt Readings from Last 3 Encounters:  01/23/19 100 lb 12.8 oz (45.7 kg)  12/09/18 100 lb (45.4 kg)  09/06/18 104 lb (47.2 kg)    Physical Exam  Constitutional:  Body mass index is 18.44 kg/m.,  not in acute distress, normal state of mind Eyes: PERRLA, EOMI, no exophthalmos ENT: moist mucous  membranes, no gross thyromegaly, no gross cervical lymphadenopathy Cardiovascular: normal precordial activity, Regular Rate and Rhythm, no Murmur/Rubs/Gallops Respiratory:  adequate breathing efforts, no gross chest deformity, Clear to auscultation bilaterally Gastrointestinal: abdomen soft, Non -tender, No distension, Bowel Sounds present, no gross organomegaly Musculoskeletal: no gross deformities, strength intact in all four extremities Skin: moist, warm, no rashes, + tanned skin diffusely Neurological: no tremor with outstretched hands, Deep tendon reflexes normal in bilateral lower extremities.  CMP ( most recent) CMP     Component Value Date/Time   NA 142 05/23/2018 1536   K 4.3 05/23/2018 1536   CL 105 05/23/2018 1536   CO2 22 05/23/2018 1536   GLUCOSE 99 05/23/2018 1536   GLUCOSE 86 12/25/2013 1037   BUN 24 05/23/2018 1536   CREATININE 0.90 05/23/2018 1536   CREATININE 0.94 12/25/2013 1037   CALCIUM 9.7 05/23/2018 1536   PROT 6.4 02/08/2018 0810   ALBUMIN 4.4 02/08/2018 0810   AST 14 02/08/2018 0810   ALT 15 02/08/2018 0810   ALKPHOS 61 02/08/2018 0810   BILITOT 0.3 02/08/2018 0810   GFRNONAA 63 05/23/2018 1536   GFRAA 72 05/23/2018 1536     Diabetic Labs (most recent): Lab Results  Component Value Date   HGBA1C 5.4 03/01/2017   HGBA1C 5.3 09/04/2014   HGBA1C 5.8 (H) 12/25/2013     Lipid Panel ( most recent) Lipid Panel     Component Value Date/Time   CHOL 171 02/08/2018 0810   TRIG 164 (H) 02/08/2018 0810   HDL 68 02/08/2018 0810   CHOLHDL 2.5 02/08/2018 0810   CHOLHDL 3.0 03/19/2014 0712   VLDL 36 03/19/2014 0712   LDLCALC 70 02/08/2018 0810      Lab Results  Component Value Date   TSH 1.000 05/23/2018   TSH 1.010 09/03/2017  TSH 1.002 04/03/2014   TSH 1.430 03/14/2009   FREET4 1.32 05/23/2018   FREET4 1.05 04/03/2014      Assessment & Plan:   1. Loss of weight  [name]  is being seen at a kind request of Luking, Elayne Snare, MD. - I  have reviewed her available endocrine records and clinically evaluated the patient. - Based on these reviews, she has been losing weight progressively over the last 2 to 3 years resulting in a loss of 10 pounds.  Her clinical picture is not complete for specific diagnosis to proceed with a definitive treatment plan.   -I offered her evaluation for adrenal sufficiency with ACTH stimulation test. -If her studies are negative, she will be considered for empiric treatment with low-dose Creon, given her history of diarrhea. -She does not seem to have appetite deficit, however, adequacy of her nutritional intake cannot be assessed only based on her report at this time.  She may benefit from a consult with a nutritionist.    -she will return in 2 week to review her repeat labs.  She does not have thyroid dysfunction  - I did not initiate any new prescriptions today. - I advised her  to maintain close follow up with Kathyrn Drown, MD for primary care needs.   - Time spent with the patient: 45 minutes, of which >50% was spent in obtaining information about her symptoms, reviewing her previous labs/studies,  evaluations, and treatments, counseling her about her unintended weight loss, and developing a plan to confirm the diagnosis and long term treatment based on the latest standards of care/guidelines.    [name] participated in the discussions, expressed understanding, and voiced agreement with the above plans.  All questions were answered to her satisfaction. she is encouraged to contact clinic should she have any questions or concerns prior to her return visit.  Follow up plan: Return in about 1 week (around 01/30/2019), or labs tomorrow before 8AM, for Follow up with Pre-visit Labs.   Glade Lloyd, MD [facility] [address] [place], [place] [address] Phone: [phone]  Fax: [phone]     01/23/2019, 4:41 PM  This note was  partially dictated with voice recognition software. Similar sounding words can be transcribed inadequately or may not  be corrected upon review.

## 2019-01-24 ENCOUNTER — Ambulatory Visit (HOSPITAL_COMMUNITY)
Admission: RE | Admit: 2019-01-24 | Discharge: 2019-01-24 | Disposition: A | Discharge status: Home / Self Care | Payer: Medicare Other | Source: Ambulatory Visit | Attending: Family Medicine | Admitting: Family Medicine

## 2019-01-24 DIAGNOSIS — N838 Other noninflammatory disorders of ovary, fallopian tube and broad ligament: Secondary | ICD-10-CM | POA: Diagnosis not present

## 2019-01-24 DIAGNOSIS — N9489 Other specified conditions associated with female genital organs and menstrual cycle: Secondary | ICD-10-CM | POA: Insufficient documentation

## 2019-01-24 LAB — POCT I-STAT CREATININE: Creatinine, Ser: 1 mg/dL (ref 0.44–1.00)

## 2019-01-24 MED ORDER — GADOBUTROL 1 MMOL/ML IV SOLN
5.0000 mL | Freq: Once | INTRAVENOUS | Status: AC | PRN
  Administered 2019-01-24: 5 mL via INTRAVENOUS

## 2019-01-25 ENCOUNTER — Other Ambulatory Visit (HOSPITAL_COMMUNITY)
Admission: RE | Admit: 2019-01-25 | Discharge: 2019-01-25 | Disposition: A | Discharge status: Home / Self Care | Payer: Medicare Other | Source: Ambulatory Visit | Attending: "Endocrinology | Admitting: "Endocrinology

## 2019-01-25 ENCOUNTER — Other Ambulatory Visit: Payer: Self-pay

## 2019-01-25 ENCOUNTER — Encounter (HOSPITAL_COMMUNITY)
Admission: RE | Admit: 2019-01-25 | Discharge: 2019-01-25 | Disposition: A | Discharge status: Home / Self Care | Payer: Medicare Other | Source: Ambulatory Visit | Attending: "Endocrinology | Admitting: "Endocrinology

## 2019-01-25 ENCOUNTER — Encounter (HOSPITAL_COMMUNITY): Payer: Self-pay

## 2019-01-25 DIAGNOSIS — E2740 Unspecified adrenocortical insufficiency: Secondary | ICD-10-CM | POA: Diagnosis not present

## 2019-01-25 DIAGNOSIS — R634 Abnormal weight loss: Secondary | ICD-10-CM | POA: Insufficient documentation

## 2019-01-25 LAB — ACTH STIMULATION, 3 TIME POINTS
Cortisol, 30 Min: 18.4 ug/dL
Cortisol, 60 Min: 21.1 ug/dL
Cortisol, Base: 13.8 ug/dL

## 2019-01-25 MED ORDER — COSYNTROPIN 0.25 MG IJ SOLR
0.2500 mg | Freq: Once | INTRAMUSCULAR | Status: AC
  Administered 2019-01-25: 09:00:00 0.25 mg via INTRAVENOUS

## 2019-01-30 ENCOUNTER — Ambulatory Visit (INDEPENDENT_AMBULATORY_CARE_PROVIDER_SITE_OTHER): Payer: Medicare Other | Admitting: "Endocrinology

## 2019-01-30 ENCOUNTER — Other Ambulatory Visit: Payer: Self-pay

## 2019-01-30 ENCOUNTER — Encounter: Payer: Self-pay | Admitting: "Endocrinology

## 2019-01-30 ENCOUNTER — Telehealth: Payer: Self-pay | Admitting: Family Medicine

## 2019-01-30 DIAGNOSIS — R634 Abnormal weight loss: Secondary | ICD-10-CM | POA: Diagnosis not present

## 2019-01-30 MED ORDER — PANCRELIPASE (LIP-PROT-AMYL) 24000-76000 UNITS PO CPEP: 24000.0000 [IU] | ORAL_CAPSULE | Freq: Three times a day (TID) | ORAL | 3 refills | Status: DC

## 2019-01-30 NOTE — Telephone Encounter (Signed)
Patient has cyst on ovaries. She has had MRI and scan.But she states still having pain. Please advise

## 2019-01-30 NOTE — Telephone Encounter (Signed)
Error

## 2019-01-30 NOTE — Telephone Encounter (Signed)
Right sided pain down toward pelvic area. Started Friday. Burning and stinging pain. When pt presses up against something she really notices it; also with moving. Pt states no pain when laying or sitting.  No fever. Pt states no issues with urinary. Please advise. Thank you   [place]

## 2019-01-30 NOTE — Telephone Encounter (Signed)
Left message to return call 

## 2019-01-30 NOTE — Telephone Encounter (Signed)
Unfortunately that is very difficult to figure out just by phone alone I would recommend a office visit for the patient tomorrow or Wednesday morning We will want to do a urinalysis when the patient comes please let her be aware of that (Obviously if the patient has severe troubles this evening ER next step)

## 2019-01-30 NOTE — Progress Notes (Signed)
01/30/2019, 4:59 PM                                Endocrinology Telehealth Visit Follow up Note -During COVID -19 Pandemic  I connected with [name] on 01/30/2019   by telephone and verified that I am speaking with the correct person using two identifiers. [name], [birth year]. she has verbally consented to this visit. All issues noted in this document were discussed and addressed. The format was not optimal for physical exam.   Subjective:    Patient ID: [name], female    DOB: May 13, 1940, PCP Kathyrn Drown, MD   Past Medical History:  Diagnosis Date  . Adenocarcinoma, colon ([place]) 03/2005   Stage I; resected in 03/2005  . Arthritis   . Colon cancer ([place])    Removed surgicaly   . Gastroesophageal reflux disease   . Gastroparesis   . Hearing loss    mild  . Hyperlipidemia   . Irregular heartbeat   . Ovarian cyst 2008  . Paroxysmal atrial fibrillation ([facility])    Palpitations; maintained on flecainide; -normal coronary angiography and 1999; negative stress nuclear study in 11/2005  . PONV (postoperative nausea and vomiting)   . Tobacco abuse, in remission    10-pack-years; quit in 2001   Past Surgical History:  Procedure Laterality Date  . ABDOMINAL HYSTERECTOMY  1983   partial  . BACK SURGERY    . BIOPSY  10/18/2017   Procedure: BIOPSY;  Surgeon: [name], MD;  [facility];  Service: Endoscopy;;  gastric  . BIOPSY  07/06/2018   Procedure: BIOPSY;  Surgeon: [name], MD;  [facility];  Service: Endoscopy;;  gastric  . CHOLECYSTECTOMY    . COLON SURGERY  2006   Colon cancer  . COLONOSCOPY  03/25/2012   [name], MD  . COLONOSCOPY N/A 04/26/2017   Procedure: COLONOSCOPY;  Surgeon: [name], MD;  [facility];  Service: Endoscopy;  Laterality: N/A;  2:00  . COLONOSCOPY N/A 07/06/2018   Procedure: COLONOSCOPY;  Surgeon: [name], MD;   [facility];  Service: Endoscopy;  Laterality: N/A;  10:30  . COSMETIC SURGERY  09/2007  . ESOPHAGOGASTRODUODENOSCOPY N/A 10/18/2017   Procedure: ESOPHAGOGASTRODUODENOSCOPY (EGD);  Surgeon: [name], MD;  [facility];  Service: Endoscopy;  Laterality: N/A;  . ESOPHAGOGASTRODUODENOSCOPY N/A 07/06/2018   Procedure: ESOPHAGOGASTRODUODENOSCOPY (EGD);  Surgeon: [name], MD;  [facility];  Service: Endoscopy;  Laterality: N/A;  . PARTIAL COLECTOMY  2006   adenocarcinoma  . POLYPECTOMY  04/26/2017   Procedure: POLYPECTOMY;  Surgeon: [name], MD;  [facility];  Service: Endoscopy;;  colon  . POLYPECTOMY  07/06/2018   Procedure: POLYPECTOMY;  Surgeon: [name], MD;  [facility];  Service: Endoscopy;;  colon   . TONSILLECTOMY     Social History   Socioeconomic History  . Marital status: Married    Spouse name: Not on file  . Number of children: Not on file  . Years of education: Not on  file  . Highest education level: Not on file  Occupational History  . Occupation: Medical laboratory scientific officer  . Financial resource strain: Not on file  . Food insecurity    Worry: Not on file    Inability: Not on file  . Transportation needs    Medical: Not on file    Non-medical: Not on file  Tobacco Use  . Smoking status: Former Smoker    Packs/day: 0.30    Years: 40.00    Pack years: 12.00    Start date: 02/23/1963    Quit date: 03/25/2002    Years since quitting: 16.8  . Smokeless tobacco: Never Used  . Tobacco comment: Quit smoking in 2001  Substance and Sexual Activity  . Alcohol use: No    Alcohol/week: 0.0 standard drinks  . Drug use: No  . Sexual activity: Yes    Birth control/protection: Surgical  Lifestyle  . Physical activity    Days per week: Not on file    Minutes per session: Not on file  . Stress: Not on file  Relationships  . Social Herbalist on phone: Not on file    Gets  together: Not on file    Attends religious service: Not on file    Active member of club or organization: Not on file    Attends meetings of clubs or organizations: Not on file    Relationship status: Not on file  Other Topics Concern  . Not on file  Social History Narrative   Lives with husband in a one story home.  Has 3 children.  Retired from Medical sales representative work.  Education: some college.   Family History  Problem Relation Age of Onset  . Heart disease Mother   . Stroke Mother   . Heart attack Mother   . Cancer Father        lung  . Heart disease Father   . Hypertension Father   . Cancer Brother        rectal  . Healthy Son   . Heart failure Sister   . Stroke Sister    Outpatient Encounter Medications as of 01/30/2019  Medication Sig  . Calcium Carbonate (CALCIUM 600 PO) Take by mouth.  Arne Cleveland 5 MG TABS tablet TAKE ONE TABLET BY MOUTH TWICE A DAY  . flecainide (TAMBOCOR) 100 MG tablet TAKE ONE (1) TABLET BY MOUTH EVERY 12 HOURS  . HYDROcodone-acetaminophen (NORCO) 10-325 MG tablet One tablet TID PRN Pain  . lactulose (CHRONULAC) 10 GM/15ML solution Take 20 g by mouth at bedtime.  . lipase/protease/amylase 24000-76000 units CPEP Take 1 capsule (24,000 Units total) by mouth 3 (three) times daily with meals.  . NONFORMULARY OR COMPOUNDED ITEM Compounded med from [facility] C/diclofenac/gabapentin/lido 1%,5%,5% 100grams  . omeprazole (PRILOSEC) 20 MG capsule Take 20 mg by mouth daily before breakfast.   . rosuvastatin (CRESTOR) 40 MG tablet TAKE ONE (1) TABLET BY MOUTH EVERY DAY  . triamcinolone cream (KENALOG) 0.1 % Apply 1 application topically 2 (two) times daily. Up to 2 weeks  . vitamin B-12 (CYANOCOBALAMIN) 1000 MCG tablet Take 1,000 mcg by mouth daily.   No facility-administered encounter medications on file as of 01/30/2019.    ALLERGIES: No Known Allergies  VACCINATION STATUS: Immunization History  Administered Date(s) Administered  . Influenza Split 02/21/2013  .  Influenza,inj,Quad PF,6+ Mos 03/29/2014, 03/06/2015, 03/10/2016, 03/01/2017, 02/15/2018  . Pneumococcal Conjugate-13 03/29/2014  . Pneumococcal Polysaccharide-23 03/30/2008  . Td 09/11/2014  . Zoster 03/11/2017  .  Zoster Recombinat (Shingrix) 07/13/2017    HPI [name] is 78 y.o. female who presents today with a medical history as above. she is engaged in telehealth via telephone after she was seen  in consultation for unintended weight loss requested by Kathyrn Drown, MD.  she has multiple medical problems as above.   She has observed progressive unintended weight loss over the last 2 to 3 years dropping from her baseline weight of 110 pounds to her current level of 100 pounds, loss of 10 pounds.  She reports reasonable appetite, confirmed by her husband accompanied her to the clinic and that she eats as much as he does.  He does not have nausea,  vomiting.  Did have a course  of on and off diarrhea/loose stool for several months before he stopped weeks ago.  She is a survivor of colon cancer, currently being worked up for ovarian cyst.   -She did not lose any further since her last visit.  She denies any prior history of thyroid, adrenal dysfunctions.  Her recent ACTH stimulation test for adrenal evaluation is normal.  She does not have diabetes.  She had a random cortisol level of low normal at 7.7 about a week ago.  She is a former smoker, not diagnosed with COPD.  She denies exposure to heavy dose steroids on a chronic basis.    Review of Systems  Limited as above  Objective:    There were no vitals taken for this visit.  Wt Readings from Last 3 Encounters:  01/25/19 100 lb 12.8 oz (45.7 kg)  01/23/19 100 lb 12.8 oz (45.7 kg)  12/09/18 100 lb (45.4 kg)    Physical Exam   CMP ( most recent) CMP     Component Value Date/Time   NA 142 05/23/2018 1536   K 4.3 05/23/2018 1536   CL 105 05/23/2018 1536   CO2 22 05/23/2018 1536   GLUCOSE 99 05/23/2018 1536   GLUCOSE 86  12/25/2013 1037   BUN 24 05/23/2018 1536   CREATININE 1.00 01/24/2019 1402   CREATININE 0.94 12/25/2013 1037   CALCIUM 9.7 05/23/2018 1536   PROT 6.4 02/08/2018 0810   ALBUMIN 4.4 02/08/2018 0810   AST 14 02/08/2018 0810   ALT 15 02/08/2018 0810   ALKPHOS 61 02/08/2018 0810   BILITOT 0.3 02/08/2018 0810   GFRNONAA 63 05/23/2018 1536   GFRAA 72 05/23/2018 1536     Diabetic Labs (most recent): Lab Results  Component Value Date   HGBA1C 5.4 03/01/2017   HGBA1C 5.3 09/04/2014   HGBA1C 5.8 (H) 12/25/2013     Lipid Panel ( most recent) Lipid Panel     Component Value Date/Time   CHOL 171 02/08/2018 0810   TRIG 164 (H) 02/08/2018 0810   HDL 68 02/08/2018 0810   CHOLHDL 2.5 02/08/2018 0810   CHOLHDL 3.0 03/19/2014 0712   VLDL 36 03/19/2014 0712   LDLCALC 70 02/08/2018 0810      Lab Results  Component Value Date   TSH 1.000 05/23/2018   TSH 1.010 09/03/2017   TSH 1.002 04/03/2014   TSH 1.430 03/14/2009   FREET4 1.32 05/23/2018   FREET4 1.05 04/03/2014    Recent Results (from the past 2160 hour(s))  I-STAT creatinine     Status: None   Collection Time: 01/24/19  2:02 PM  Result Value Ref Range   Creatinine, Ser 1.00 0.44 - 1.00 mg/dL  ACTH stimulation, 3 time points     Status: None  Collection Time: 01/25/19  8:38 AM  Result Value Ref Range   Cortisol, Base 13.8 ug/dL    Comment: NO NORMAL RANGE ESTABLISHED FOR THIS TEST   Cortisol, 30 Min 18.4 ug/dL   Cortisol, 60 Min 21.1 ug/dL    Comment: Performed at [facility] [address] [place], [place] [address]     Assessment & Plan:   1. Loss of weight   - I have reviewed her available endocrine records and clinically evaluated the patient. - Based on these reviews, she has been losing weight progressively over the last 2 to 3 years resulting in a loss of 10 pounds.   -Her recent ACTH stimulation test for adrenal evaluation is normal.  She has multifactorial reasons for weight loss/inability to gain  weight.   She does not seem to have appetite deficit, however, adequacy of her nutritional intake cannot be assessed only based on her report at this time.  She may benefit from a consult with a nutritionist.  -There is some suspicion for malabsorption, given her history of on and off diarrhea.  I approach her with empiric treatment with low-dose Creon and she agrees.   I discussed and prescribed Creon 24,000 p.o. with meals and snacks.   She does not have thyroid dysfunction  - I advised her  to maintain close follow up with Kathyrn Drown, MD for primary care needs.  She is encouraged to continue follow-up with her other providers including her OB/GYN given recent concern with ovarian cyst, as well as her history of colon cancer.  Time for this visit: 15 minutes. [name]  participated in the discussions, expressed understanding, and voiced agreement with the above plans.  All questions were answered to her satisfaction. she is encouraged to contact clinic should she have any questions or concerns prior to her return visit.   Follow up plan: Return in about 6 months (around 07/30/2019) for Follow up with Pre-visit Labs.   Glade Lloyd, MD [facility] [address] [place], [place] [address] Phone: [phone]  Fax: [phone]     01/30/2019, 4:59 PM  This note was partially dictated with voice recognition software. Similar sounding words can be transcribed inadequately or may not  be corrected upon review.

## 2019-01-30 NOTE — Telephone Encounter (Signed)
Pt returned call and verbalized understanding. Once provider schedule is out for Wednesday morning, she would be put in at 10 am. Informed pt that we would collect a urine sample also. Pt verbalized understanding.

## 2019-02-01 ENCOUNTER — Ambulatory Visit (INDEPENDENT_AMBULATORY_CARE_PROVIDER_SITE_OTHER): Payer: Medicare Other | Admitting: Family Medicine

## 2019-02-01 ENCOUNTER — Other Ambulatory Visit: Payer: Self-pay

## 2019-02-01 VITALS — BP 120/60 | Temp 97.5°F | Ht 62.0 in | Wt 99.2 lb

## 2019-02-01 DIAGNOSIS — R102 Pelvic and perineal pain: Secondary | ICD-10-CM | POA: Diagnosis not present

## 2019-02-01 DIAGNOSIS — N9489 Other specified conditions associated with female genital organs and menstrual cycle: Secondary | ICD-10-CM | POA: Diagnosis not present

## 2019-02-01 DIAGNOSIS — Z23 Encounter for immunization: Secondary | ICD-10-CM

## 2019-02-01 LAB — POCT URINALYSIS DIPSTICK
Spec Grav, UA: 1.015 (ref 1.010–1.025)
pH, UA: 6 (ref 5.0–8.0)

## 2019-02-01 MED ORDER — TRIAMCINOLONE ACETONIDE 0.1 % EX CREA: 1.0000 "application " | TOPICAL_CREAM | Freq: Two times a day (BID) | CUTANEOUS | 3 refills | Status: DC

## 2019-02-01 NOTE — Progress Notes (Signed)
   Subjective:    Patient ID: [name], female    DOB: [birth year], 78 y.o.   MRN: [id number]  HPI Patient arrives for a follow up on weight loss. Patient also having right lower quadrant pain and wonders if it could be from her cysts. Patient is concerned that this could be a sign of impending problem with the cyst for possibility of something worse she denies high fever chills sweats diarrhea she denies bloody stools denies dysuria or urinary frequency we did check a urine today and it did look good I did review the MRI results with the patient overall it appears to be benign but certainly we are concerned that there could be something more going on so it is reasonable to get further opinion Patient had recent visit with endocrinology as well they started her on a pancreatic supplement Review of Systems Please see above    Objective:   Physical Exam Lungs clear respiratory rate normal heart regular abdomen soft no guarding rebound or tenderness extremities no edema skin warm dry   15 minutes was spent with patient today discussing healthcare issues which they came.  More than 50% of this visit-total duration of visit-was spent in counseling and coordination of care.  Please see diagnosis regarding the focus of this coordination and care     Assessment & Plan:  I recommend the patient follow-up in 3 months to recheck the weight.  I also recommend healthy eating and regular snacks  Because of the ovarian issue will go ahead with gynecology consultation.  Finally her urine looks good no sign of infection

## 2019-02-07 ENCOUNTER — Encounter: Payer: Self-pay | Admitting: Family Medicine

## 2019-03-06 ENCOUNTER — Telehealth: Payer: Self-pay | Admitting: Family Medicine

## 2019-03-06 ENCOUNTER — Other Ambulatory Visit: Payer: Self-pay | Admitting: Family Medicine

## 2019-03-06 NOTE — Telephone Encounter (Signed)
Refill of her medication was sent into her pharmacy She will need to do a pain medicine follow-up visit either in person or virtual I would recommend virtual before receiving further prescription so therefore I would set this up for November please

## 2019-03-07 NOTE — Telephone Encounter (Signed)
Patient notified and scheduled virtual visit for pain management.

## 2019-03-22 ENCOUNTER — Other Ambulatory Visit: Payer: Self-pay | Admitting: Cardiology

## 2019-03-27 ENCOUNTER — Ambulatory Visit (INDEPENDENT_AMBULATORY_CARE_PROVIDER_SITE_OTHER): Payer: Medicare Other | Admitting: Family Medicine

## 2019-03-27 DIAGNOSIS — M5440 Lumbago with sciatica, unspecified side: Secondary | ICD-10-CM

## 2019-03-27 DIAGNOSIS — N9489 Other specified conditions associated with female genital organs and menstrual cycle: Secondary | ICD-10-CM | POA: Diagnosis not present

## 2019-03-27 DIAGNOSIS — G8929 Other chronic pain: Secondary | ICD-10-CM

## 2019-03-27 MED ORDER — HYDROCODONE-ACETAMINOPHEN 10-325 MG PO TABS: ORAL_TABLET | ORAL | 0 refills | Status: DC

## 2019-03-27 NOTE — Progress Notes (Signed)
Subjective:    Patient ID: [name], female    DOB: 21-Aug-1940, 78 y.o.   MRN: [id number]  Pt states weight today is 100 lbs and  BP is 107/56.   HPI This patient was seen today for chronic pain  The medication list was reviewed and updated.   -Compliance with medication: takes 3 a day  - Number patient states they take daily: 3  -when was the last dose patient took? today  The patient was advised the importance of maintaining medication and not using illegal substances with these.  Here for refills and follow up  The patient was educated that we can provide 3 monthly scripts for their medication, it is their responsibility to follow the instructions.  Side effects or complications from medications: none  Patient is aware that pain medications are meant to minimize the severity of the pain to allow their pain levels to improve to allow for better function. They are aware of that pain medications cannot totally remove their pain.  Due for UDT ( at least once per year) : due today but doing a virtual pain  Pt wants to discuss taking voltaren cream she uses for her knee. Daughter in law told her she should not be using that while on eliquis.    I connected with  [name] on 03/27/19 by a video enabled telemedicine application and verified that I am speaking with the correct person using two identifiers.   I discussed the limitations of evaluation and management by telemedicine. The patient expressed understanding and agreed to proceed.  Virtual Visit via Video Note  I connected with [name] on 03/27/19 at  2:00 PM EST by a video enabled telemedicine application and verified that I am speaking with the correct person using two identifiers.  Location: Patient: Home Provider: Office   I discussed the limitations of evaluation and management by telemedicine and the availability of in person appointments. The patient expressed understanding and agreed to proceed.   History of Present Illness:    Observations/Objective:   Assessment and Plan:   Follow Up Instructions:    I discussed the assessment and treatment plan with the patient. The patient was provided an opportunity to ask questions and all were answered. The patient agreed with the plan and demonstrated an understanding of the instructions.   The patient was advised to call back or seek an in-person evaluation if the symptoms worsen or if the condition fails to improve as anticipated.  I provided 16 minutes of non-face-to-face time during this encounter.   Sallee Lange, MD      Review of Systems  Constitutional: Negative for activity change and appetite change.  HENT: Negative for congestion and rhinorrhea.   Respiratory: Negative for cough and shortness of breath.   Cardiovascular: Negative for chest pain and leg swelling.  Gastrointestinal: Negative for abdominal pain, nausea and vomiting.  Skin: Negative for color change.  Neurological: Negative for dizziness and weakness.  Psychiatric/Behavioral: Negative for agitation and confusion.       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 A review over at their current pain status was discussed. Drug registry was checked. Prescriptions were given. Discussion was held regarding the importance of compliance with medication as well as pain medication contract.  Time for questions regarding pain management plan occurred. Importance of regular followup visits was discussed. Patient was informed that medication may cause drowsiness and  should not be combined  with other medications/alcohol or street drugs. Patient was cautioned that medication could cause drowsiness. If the patient feels medication is causing altered alertness then do not drive or operate dangerous equipment.

## 2019-03-28 NOTE — Addendum Note (Signed)
Addended by: Vicente Males on: 03/28/2019 11:34 AM   Modules accepted: Orders

## 2019-03-28 NOTE — Progress Notes (Signed)
Referral placed.

## 2019-04-10 ENCOUNTER — Other Ambulatory Visit: Payer: Self-pay | Admitting: Family Medicine

## 2019-05-04 ENCOUNTER — Telehealth: Payer: Self-pay | Admitting: Cardiology

## 2019-05-04 NOTE — Telephone Encounter (Signed)
Pt notified and voiced understanding 

## 2019-05-04 NOTE — Telephone Encounter (Signed)
Spoke with pt who c/o SOB, tiredness when moving around. Denies SOB at rest. Denies CP. States that this started 4 days ago.  BP is 133/69 HR 82. Please advise.

## 2019-05-04 NOTE — Telephone Encounter (Signed)
Pt's heart has been racing when she's up moving around and feels like she's going to pass out. States no Chest pain but will get out of breath to the point that she has to lay back down.   [phone]

## 2019-05-04 NOTE — Telephone Encounter (Signed)
Heart rate does not appear to be elevated.  Blood pressure is also normal.  If symptoms have been progressing over the last several days she may need an ED evaluation.

## 2019-05-08 ENCOUNTER — Ambulatory Visit (INDEPENDENT_AMBULATORY_CARE_PROVIDER_SITE_OTHER): Payer: Medicare Other

## 2019-05-08 ENCOUNTER — Telehealth: Payer: Self-pay | Admitting: Cardiology

## 2019-05-08 ENCOUNTER — Other Ambulatory Visit: Payer: Self-pay

## 2019-05-08 VITALS — BP 102/64 | HR 76 | Temp 97.6°F | Ht 62.0 in | Wt 99.4 lb

## 2019-05-08 DIAGNOSIS — R000 Tachycardia, unspecified: Secondary | ICD-10-CM | POA: Diagnosis not present

## 2019-05-08 NOTE — Telephone Encounter (Signed)
Patient has been having BP issues.   When she stands up and start walking she gets light headed and heart beats really fast

## 2019-05-08 NOTE — Telephone Encounter (Signed)
Spoke with pt. She complains that her heart has been racing on and off daily for the past several weeks. Pt will come in for nurse visit this afternoon for EKG and blood pressure check. Pt made aware.

## 2019-05-08 NOTE — Patient Instructions (Signed)
Will have Dr.Branch review your EKG.We will call you if we need to make any changes to your medication or if you need lab work.    Thank you for choosing [facility] !

## 2019-05-08 NOTE — Progress Notes (Signed)
EKG NSR, will have Dr.Branch review.She feels flecainide 100 mg BID is not working for her anymore.

## 2019-05-31 ENCOUNTER — Other Ambulatory Visit: Payer: Self-pay

## 2019-05-31 ENCOUNTER — Encounter: Payer: Self-pay | Admitting: Family Medicine

## 2019-05-31 ENCOUNTER — Ambulatory Visit: Payer: Medicare Other | Attending: Internal Medicine

## 2019-05-31 DIAGNOSIS — Z20822 Contact with and (suspected) exposure to covid-19: Secondary | ICD-10-CM | POA: Diagnosis not present

## 2019-06-01 LAB — NOVEL CORONAVIRUS, NAA: SARS-CoV-2, NAA: NOT DETECTED

## 2019-06-02 ENCOUNTER — Ambulatory Visit: Payer: Medicare Other | Admitting: Cardiology

## 2019-06-02 ENCOUNTER — Encounter: Payer: Self-pay | Admitting: Cardiology

## 2019-06-02 ENCOUNTER — Ambulatory Visit (INDEPENDENT_AMBULATORY_CARE_PROVIDER_SITE_OTHER): Payer: Medicare Other | Admitting: Cardiology

## 2019-06-02 VITALS — BP 131/62 | HR 63 | Temp 97.2°F | Ht 62.0 in | Wt 100.0 lb

## 2019-06-02 DIAGNOSIS — R002 Palpitations: Secondary | ICD-10-CM | POA: Diagnosis not present

## 2019-06-02 DIAGNOSIS — R42 Dizziness and giddiness: Secondary | ICD-10-CM

## 2019-06-02 MED ORDER — MIDODRINE HCL 2.5 MG PO TABS: 2.5000 mg | ORAL_TABLET | Freq: Three times a day (TID) | ORAL | 3 refills | Status: DC

## 2019-06-02 NOTE — Progress Notes (Signed)
Clinical Summary Ms. Sartini is a 79 y.o.female seen today for follow up of the following medical problems.   1. Palpitations/Dizziness - no symptoms at rest - with moving around feels lightheaded, chest feels heavy with palpitations. When she sits down after some time it resolves. Comes on with exertion. - rare palpitations at rest.  - coffee 1/2 cup decaf, 1 glass of decaf tea at night, occasional juice, 1 ensure a day. Drinks 1 glass of water a day. N/V/D have resolved - one day had bp 80/40s.  Home orthostastics Lying down: 128/59 p 56 Sitting 115/62 p 60 Standing 95/59 p 70    08/2018 monitor: rare supraventricular and ventricular ectopy. 3 very short runs of atach longest 4 beats. No afib  - ongoing dizziness. In the morning feeling lightheaded and dizzy, shaky and feeling like she may pass out on her feet - low bp's at homes, SBPs 80s to 90s at times while sitting    2. Afib  - has done well on flecanidefor severalyears - 08/2018 monitor without afib - recent palpitations with activities, + SOB   3. Atypical chest pain -long history, prior ischemic testing has been negative  - seen 08/2017 by PA Strader for atypical chest pain. - 08/2017 nuclear stress negative. -can have some pain at time associated with palpitations   4. Hyperlipidemia - on statin  5.Weight loss - followed by pcp and GI  -normal TSH earlier this year Past Medical History:  Diagnosis Date  . Adenocarcinoma, colon ([place]) 03/2005   Stage I; resected in 03/2005  . Arthritis   . Colon cancer ([place])    Removed surgicaly   . Gastroesophageal reflux disease   . Gastroparesis   . Hearing loss    mild  . Hyperlipidemia   . Irregular heartbeat   . Ovarian cyst 2008  . Paroxysmal atrial fibrillation ([facility])    Palpitations; maintained on flecainide; -normal coronary angiography and 1999; negative stress nuclear study in 11/2005  . PONV (postoperative nausea and vomiting)     . Tobacco abuse, in remission    10-pack-years; quit in 2001     No Known Allergies   Current Outpatient Medications  Medication Sig Dispense Refill  . Calcium Carbonate (CALCIUM 600 PO) Take by mouth.    Arne Cleveland 5 MG TABS tablet TAKE ONE TABLET BY MOUTH TWICE A DAY 60 tablet 5  . flecainide (TAMBOCOR) 100 MG tablet TAKE ONE (1) TABLET BY MOUTH EVERY 12 HOURS 180 tablet 3  . HYDROcodone-acetaminophen (NORCO) 10-325 MG tablet TAKE ONE TABLET BY MOUTH THREE TIMES A DAY AS NEEDED FOR PAIN 90 tablet 0  . HYDROcodone-acetaminophen (NORCO) 10-325 MG tablet Take one tablet tid prn pain 90 tablet 0  . HYDROcodone-acetaminophen (NORCO) 10-325 MG tablet Take one tid prn pain 90 tablet 0  . lactulose (CHRONULAC) 10 GM/15ML solution Take 20 g by mouth at bedtime.    . lipase/protease/amylase 24000-76000 units CPEP Take 1 capsule (24,000 Units total) by mouth 3 (three) times daily with meals. 90 capsule 3  . NONFORMULARY OR COMPOUNDED ITEM Compounded med from [facility] C/diclofenac/gabapentin/lido 1%,5%,5% 100grams    . omeprazole (PRILOSEC) 20 MG capsule Take 20 mg by mouth daily before breakfast.     . rosuvastatin (CRESTOR) 40 MG tablet TAKE ONE (1) TABLET BY MOUTH EVERY DAY 90 tablet 0  . triamcinolone cream (KENALOG) 0.1 % Apply 1 application topically 2 (two) times daily. Up to 2 weeks 30 g 3  .  vitamin B-12 (CYANOCOBALAMIN) 1000 MCG tablet Take 1,000 mcg by mouth daily.     No current facility-administered medications for this visit.     Past Surgical History:  Procedure Laterality Date  . ABDOMINAL HYSTERECTOMY  1983   partial  . BACK SURGERY    . BIOPSY  10/18/2017   Procedure: BIOPSY;  Surgeon: [name], MD;  [facility];  Service: Endoscopy;;  gastric  . BIOPSY  07/06/2018   Procedure: BIOPSY;  Surgeon: [name], MD;  [facility];  Service: Endoscopy;;  gastric  . CHOLECYSTECTOMY    . COLON SURGERY  2006   Colon cancer  . COLONOSCOPY   03/25/2012   [name], MD  . COLONOSCOPY N/A 04/26/2017   Procedure: COLONOSCOPY;  Surgeon: [name], MD;  [facility];  Service: Endoscopy;  Laterality: N/A;  2:00  . COLONOSCOPY N/A 07/06/2018   Procedure: COLONOSCOPY;  Surgeon: [name], MD;  [facility];  Service: Endoscopy;  Laterality: N/A;  10:30  . COSMETIC SURGERY  09/2007  . ESOPHAGOGASTRODUODENOSCOPY N/A 10/18/2017   Procedure: ESOPHAGOGASTRODUODENOSCOPY (EGD);  Surgeon: [name], MD;  [facility];  Service: Endoscopy;  Laterality: N/A;  . ESOPHAGOGASTRODUODENOSCOPY N/A 07/06/2018   Procedure: ESOPHAGOGASTRODUODENOSCOPY (EGD);  Surgeon: [name], MD;  [facility];  Service: Endoscopy;  Laterality: N/A;  . PARTIAL COLECTOMY  2006   adenocarcinoma  . POLYPECTOMY  04/26/2017   Procedure: POLYPECTOMY;  Surgeon: [name], MD;  [facility];  Service: Endoscopy;;  colon  . POLYPECTOMY  07/06/2018   Procedure: POLYPECTOMY;  Surgeon: [name], MD;  [facility];  Service: Endoscopy;;  colon   . TONSILLECTOMY       No Known Allergies    Family History  Problem Relation Age of Onset  . Heart disease Mother   . Stroke Mother   . Heart attack Mother   . Cancer Father        lung  . Heart disease Father   . Hypertension Father   . Cancer Brother        rectal  . Healthy Son   . Heart failure Sister   . Stroke Sister      Social History Ms. Beem reports that she quit smoking about 17 years ago. She started smoking about 56 years ago. She has a 12.00 pack-year smoking history. She has never used smokeless tobacco. Ms. Yzaguirre reports no history of alcohol use.   Review of Systems CONSTITUTIONAL: No weight loss, fever, chills, weakness or fatigue.  HEENT: Eyes: No visual loss, blurred vision, double vision or yellow sclerae.No hearing loss, sneezing, congestion, runny nose or sore throat.  SKIN: No rash  or itching.  CARDIOVASCULAR: per hpi RESPIRATORY: No shortness of breath, cough or sputum.  GASTROINTESTINAL: No anorexia, nausea, vomiting or diarrhea. No abdominal pain or blood.  GENITOURINARY: No burning on urination, no polyuria NEUROLOGICAL:per hpi MUSCULOSKELETAL: No muscle, back pain, joint pain or stiffness.  LYMPHATICS: No enlarged nodes. No history of splenectomy.  PSYCHIATRIC: No history of depression or anxiety.  ENDOCRINOLOGIC: No reports of sweating, cold or heat intolerance. No polyuria or polydipsia.  Marland Kitchen   Physical Examination Today's Vitals   06/02/19 1350  BP: 131/62  Pulse: 63  Temp: (!) 97.2 F (36.2 C)  TempSrc: Temporal  SpO2: 97%  Weight: 100 lb (45.4 kg)  Height: 5\' 2"  (1.575 m)  Body mass index is 18.29 kg/m.  Gen: resting comfortably, no acute distress HEENT: no scleral icterus, pupils equal round and reactive, no palptable cervical adenopathy,  CV: RRR, no m/r/g, no jvd Resp: Clear to auscultation bilaterally GI: abdomen is soft, non-tender, non-distended, normal bowel sounds, no hepatosplenomegaly MSK: extremities are warm, no edema.  Skin: warm, no rash Neuro:  no focal deficits Psych: appropriate affect   Diagnostic Studies 03/2013 MPI Overall low risk exercise Cardiolite. Equivocal ST segment abnormalities were noted, mild chest pain at peak exertion, otherwise no definite arrhythmias. Perfusion imaging is consistent with breast attenuation affecting the mid to apical anteroseptal wall, no definite ischemic defects. LVEF is 71% with normal volumes and no wall motion abnormalities.   08/2017 Lexiscan nuclear stress  There was no ST segment deviation noted during stress.  The study is normal. There are no perfusion defects consistent with prior infarct or current ischemia.  This is a low risk study.  The left ventricular ejection fraction is normal (55-65%).   08/2018 48 hr holter  48 hr holter monitor  Min HR 50, Max  HR 103, Avg HR 68  Rare ventricular ectopy, 2 isolated PVCs  Rare supraventricular ectopy. Primarily PACs. Three short runs of atach, longest 4 beats.  Reported symptoms correlated with sinus rhythm    Assessment and Plan  1. Dizziness/palpitations/Orthostatic dizziness - progression of symptoms. Low bp's at home with sitting at times, previously clinic significant orthostaitc changes with standing - reports aggressive hydration - will start midodrine 2.5mg  tid.  - I think autonomic dysfunction is leading to her postural symptoms, including palpitations. Recent monitor did not show recurrent afib   2. Afib  - recent monitor without recurrence - I suspect her palpitations are more related to autnomic changes with standing as opposed to arrhtythmia at this time but may consider repeat monitor depending on clinic progress.    Virtual visit 3 weeks    Arnoldo Lenis, M.D.

## 2019-06-02 NOTE — Patient Instructions (Addendum)
Medication Instructions:  Your physician has recommended you make the following change in your medication:  Start Midodrine 2.5 mg Three Times Daily   *If you need a refill on your cardiac medications before your next appointment, please call your pharmacy*  Lab Work:  NONE   If you have labs (blood work) drawn today and your tests are completely normal, you will receive your results only by: Marland Kitchen MyChart Message (if you have MyChart) OR . A paper copy in the mail If you have any lab test that is abnormal or we need to change your treatment, we will call you to review the results.  Testing/Procedures: NONE   Follow-Up: At [facility], you and your health needs are our priority.  As part of our continuing mission to provide you with exceptional heart care, we have created designated Provider Care Teams.  These Care Teams include your primary Cardiologist (physician) and Advanced Practice Providers (APPs -  Physician Assistants and Nurse Practitioners) who all work together to provide you with the care you need, when you need it.  Your next appointment:   3 week(s)  The format for your next appointment:   Virtual Visit   Provider:   Carlyle Dolly, MD  Other Instructions Thank you for choosing [facility]!

## 2019-06-05 DIAGNOSIS — K219 Gastro-esophageal reflux disease without esophagitis: Secondary | ICD-10-CM | POA: Diagnosis not present

## 2019-06-05 DIAGNOSIS — H6983 Other specified disorders of Eustachian tube, bilateral: Secondary | ICD-10-CM | POA: Diagnosis not present

## 2019-06-05 DIAGNOSIS — H6523 Chronic serous otitis media, bilateral: Secondary | ICD-10-CM | POA: Diagnosis not present

## 2019-06-05 DIAGNOSIS — R490 Dysphonia: Secondary | ICD-10-CM | POA: Diagnosis not present

## 2019-06-06 ENCOUNTER — Other Ambulatory Visit (HOSPITAL_COMMUNITY): Payer: Self-pay | Admitting: Family Medicine

## 2019-06-06 DIAGNOSIS — Z1231 Encounter for screening mammogram for malignant neoplasm of breast: Secondary | ICD-10-CM

## 2019-06-15 DIAGNOSIS — N952 Postmenopausal atrophic vaginitis: Secondary | ICD-10-CM | POA: Diagnosis not present

## 2019-06-15 DIAGNOSIS — R634 Abnormal weight loss: Secondary | ICD-10-CM | POA: Diagnosis not present

## 2019-06-15 DIAGNOSIS — N83201 Unspecified ovarian cyst, right side: Secondary | ICD-10-CM | POA: Diagnosis not present

## 2019-06-20 ENCOUNTER — Other Ambulatory Visit: Payer: Self-pay | Admitting: Family Medicine

## 2019-06-22 DIAGNOSIS — Z23 Encounter for immunization: Secondary | ICD-10-CM | POA: Diagnosis not present

## 2019-06-23 ENCOUNTER — Telehealth (INDEPENDENT_AMBULATORY_CARE_PROVIDER_SITE_OTHER): Payer: Medicare Other | Admitting: Cardiology

## 2019-06-23 ENCOUNTER — Encounter: Payer: Self-pay | Admitting: Cardiology

## 2019-06-23 VITALS — BP 89/52 | HR 74 | Ht 62.0 in | Wt 100.0 lb

## 2019-06-23 DIAGNOSIS — R42 Dizziness and giddiness: Secondary | ICD-10-CM

## 2019-06-23 DIAGNOSIS — I951 Orthostatic hypotension: Secondary | ICD-10-CM

## 2019-06-23 DIAGNOSIS — R002 Palpitations: Secondary | ICD-10-CM

## 2019-06-23 MED ORDER — MIDODRINE HCL 5 MG PO TABS: 5.0000 mg | ORAL_TABLET | Freq: Three times a day (TID) | ORAL | 3 refills | Status: DC

## 2019-06-23 NOTE — Addendum Note (Signed)
Addended byDebbora Lacrosse R on: 06/23/2019 03:00 PM   Modules accepted: Orders

## 2019-06-23 NOTE — Progress Notes (Signed)
Virtual Visit via Telephone Note   This visit type was conducted due to national recommendations for restrictions regarding the COVID-19 Pandemic (e.g. social distancing) in an effort to limit this patient's exposure and mitigate transmission in our community.  Due to her co-morbid illnesses, this patient is at least at moderate risk for complications without adequate follow up.  This format is felt to be most appropriate for this patient at this time.  The patient did not have access to video technology/had technical difficulties with video requiring transitioning to audio format only (telephone).  All issues noted in this document were discussed and addressed.  No physical exam could be performed with this format.  Please refer to the patient's chart for her  consent to telehealth for [facility]   Date:  06/23/2019   ID:  [name], DOB 1940-12-02, MRN [id number]  Patient Location: Home Provider Location: Office  PCP:  Kathyrn Drown, MD  Cardiologist:  Carlyle Dolly, MD  Electrophysiologist:  None   Evaluation Performed:  Follow-Up Visit  Chief Complaint:  Follow up  History of Present Illness:    [name] is a 79 y.o. female seen today for follow up of the following medical problems. This is a focused visit for ongoing issues with dizziness and orthostatic hypotension.   1. Palpitations/Dizziness/Orthostatic hypotension - no symptoms at rest - with moving around feels lightheaded, chest feels heavy with palpitations. When she sits down after some time it resolves. Comes on with exertion. - rare palpitations at rest.  - coffee 1/2 cup decaf, 1 glass of decaf tea at night, occasional juice, 1 ensure a day. Drinks 1 glass of water a day. N/V/D have resolved - one day had bp 80/40s.  Home orthostastics Lying down: 128/59 p 56 Sitting 115/62 p 60 Standing 95/59 p 70    08/2018 monitor: rare supraventricular and ventricular ectopy. 3 very short runs of  atach longest 4 beats. No afib   - last visit we started midodrine 2.5mg  tid.  - symptoms somewhat improved. - SBPs low 100s, DBPs 50s.      The patient does not have symptoms concerning for COVID-19 infection (fever, chills, cough, or new shortness of breath).    Past Medical History:  Diagnosis Date  . Adenocarcinoma, colon ([place]) 03/2005   Stage I; resected in 03/2005  . Arthritis   . Colon cancer ([place])    Removed surgicaly   . Gastroesophageal reflux disease   . Gastroparesis   . Hearing loss    mild  . Hyperlipidemia   . Irregular heartbeat   . Ovarian cyst 2008  . Paroxysmal atrial fibrillation (HCC)    Palpitations; maintained on flecainide; -normal coronary angiography and 1999; negative stress nuclear study in 11/2005  . PONV (postoperative nausea and vomiting)   . Tobacco abuse, in remission    10-pack-years; quit in 2001   Past Surgical History:  Procedure Laterality Date  . ABDOMINAL HYSTERECTOMY  1983   partial  . BACK SURGERY    . BIOPSY  10/18/2017   Procedure: BIOPSY;  Surgeon: [name], MD;  [facility];  Service: Endoscopy;;  gastric  . BIOPSY  07/06/2018   Procedure: BIOPSY;  Surgeon: [name], MD;  [facility];  Service: Endoscopy;;  gastric  . CHOLECYSTECTOMY    . COLON SURGERY  2006   Colon cancer  . COLONOSCOPY  03/25/2012   [name], MD  . COLONOSCOPY N/A 04/26/2017  Procedure: COLONOSCOPY;  Surgeon: [name], MD;  [facility];  Service: Endoscopy;  Laterality: N/A;  2:00  . COLONOSCOPY N/A 07/06/2018   Procedure: COLONOSCOPY;  Surgeon: [name], MD;  [facility];  Service: Endoscopy;  Laterality: N/A;  10:30  . COSMETIC SURGERY  09/2007  . ESOPHAGOGASTRODUODENOSCOPY N/A 10/18/2017   Procedure: ESOPHAGOGASTRODUODENOSCOPY (EGD);  Surgeon: [name], MD;  [facility];  Service: Endoscopy;  Laterality: N/A;  . ESOPHAGOGASTRODUODENOSCOPY N/A  07/06/2018   Procedure: ESOPHAGOGASTRODUODENOSCOPY (EGD);  Surgeon: [name], MD;  [facility];  Service: Endoscopy;  Laterality: N/A;  . PARTIAL COLECTOMY  2006   adenocarcinoma  . POLYPECTOMY  04/26/2017   Procedure: POLYPECTOMY;  Surgeon: [name], MD;  [facility];  Service: Endoscopy;;  colon  . POLYPECTOMY  07/06/2018   Procedure: POLYPECTOMY;  Surgeon: [name], MD;  [facility];  Service: Endoscopy;;  colon   . TONSILLECTOMY       No outpatient medications have been marked as taking for the 06/23/19 encounter (Appointment) with Arnoldo Lenis, MD.     Allergies:   Patient has no known allergies.   Social History   Tobacco Use  . Smoking status: Former Smoker    Packs/day: 0.30    Years: 40.00    Pack years: 12.00    Start date: 02/23/1963    Quit date: 03/25/2002    Years since quitting: 17.2  . Smokeless tobacco: Never Used  . Tobacco comment: Quit smoking in 2001  Substance Use Topics  . Alcohol use: No    Alcohol/week: 0.0 standard drinks  . Drug use: No     Family Hx: The patient's family history includes Cancer in her brother and father; Healthy in her son; Heart attack in her mother; Heart disease in her father and mother; Heart failure in her sister; Hypertension in her father; Stroke in her mother and sister.  ROS:   Please see the history of present illness.     All other systems reviewed and are negative.   Prior CV studies:   The following studies were reviewed today:  08/2018 holter monitor  48 hr holter monitor  Min HR 50, Max HR 103, Avg HR 68  Rare ventricular ectopy, 2 isolated PVCs  Rare supraventricular ectopy. Primarily PACs. Three short runs of atach, longest 4 beats.  Reported symptoms correlated with sinus rhythm   Labs/Other Tests and Data Reviewed:    EKG:  No ECG reviewed.  Recent Labs: 01/24/2019: Creatinine, Ser 1.00   Recent Lipid Panel Lab Results    Component Value Date/Time   CHOL 171 02/08/2018 08:10 AM   TRIG 164 (H) 02/08/2018 08:10 AM   HDL 68 02/08/2018 08:10 AM   CHOLHDL 2.5 02/08/2018 08:10 AM   CHOLHDL 3.0 03/19/2014 07:12 AM   LDLCALC 70 02/08/2018 08:10 AM    Wt Readings from Last 3 Encounters:  06/02/19 100 lb (45.4 kg)  05/08/19 99 lb 6.4 oz (45.1 kg)  02/01/19 99 lb 3.2 oz (45 kg)     Objective:    Vital Signs:   Today's Vitals   06/23/19 1342  BP: (!) 89/52  Pulse: 74  Weight: 100 lb (45.4 kg)  Height: 5\' 2"  (1.575 m)   Body mass index is 18.29 kg/m. Noraml affect. Normal speech pattern and tone. Comfortable, no apparent distress. No audible signs of SOB or wheezing.   ASSESSMENT & PLAN:  1. Dizziness/palpitations/Orthostatic hypotension - some improvement in symptoms since starting midodrine but still ongoing - increase midodrine to 5mg  tid, continue aggressive hydration. Will give Rx for compression stockings. Room to titrate midodrine further if needed     COVID-19 Education: The signs and symptoms of COVID-19 were discussed with the patient and how to seek care for testing (follow up with PCP or arrange E-visit).  The importance of social distancing was discussed today.  Time:   Today, I have spent 21 minutes with the patient with telehealth technology discussing the above problems.     Medication Adjustments/Labs and Tests Ordered: Current medicines are reviewed at length with the patient today.  Concerns regarding medicines are outlined above.   Tests Ordered: No orders of the defined types were placed in this encounter.   Medication Changes: No orders of the defined types were placed in this encounter.   Follow Up:  Either In Person or Virtual in 1 month(s)  Signed, Carlyle Dolly, MD  06/23/2019 12:32 PM    [place]

## 2019-06-23 NOTE — Patient Instructions (Signed)
Medication Instructions:  INCREASE MIDODRINE TO 5 MG - THREE TIMES DAILY   Labwork: NONE  Testing/Procedures: NONE  Follow-Up: Your physician recommends that you schedule a follow-up appointment in: 1 MONTH    Any Other Special Instructions Will Be Listed Below (If Applicable).  I HAVE FAXED AN RX TO [facility] FOR COMPRESSION STOCKINGS    If you need a refill on your cardiac medications before your next appointment, please call your pharmacy.

## 2019-07-06 DIAGNOSIS — N83201 Unspecified ovarian cyst, right side: Secondary | ICD-10-CM | POA: Diagnosis not present

## 2019-07-07 ENCOUNTER — Telehealth: Payer: Self-pay | Admitting: *Deleted

## 2019-07-07 NOTE — Telephone Encounter (Signed)
Called the patient and scheduled a new patient appt for 3/5.

## 2019-07-10 NOTE — Progress Notes (Signed)
GYNECOLOGIC ONCOLOGY NEW PATIENT CONSULTATION   Patient Name: [name]  Patient Age: 79 y.o. Date of Service: 07/14/19 Referring Provider: Kathyrn Drown, [address] [place],  [place] [address]   Primary Care Provider: Kathyrn Drown, MD Consulting Provider: Jeral Pinch, MD   Assessment/Plan:  Postmenopausal female with a remote history of colon cancer and a 15 pound weight loss over the last 14 months with an adnexal mass that has been stable in size since approximately 2009.  Reviewed with the patient her CT history in our system, which dates back to 2009. At this time, she had an adnexal mass measuring approximately 3 cm. Over the subsequent 12 years, she has now had multiple imaging studies that show stability of this mass both in terms of size as well as character. On recent PET scan, there was no FDG avidity to the mass. I think that it is very unlikely that this is a malignant process given the lack of any findings concerning for metastatic disease and the time course over which the mass has been stable. She has had tumor markers in the recent past that were also within normal limits. Think it would be reasonable to repeat an ultrasound 6-12 months after her most recent imaging to assure that the mass is stable in size and that there are no new findings. After this time, I would recommend discontinuing routine surveillance imaging. The patient was understanding of these recommendations and happy to hear that I do not believe this to be a cancerous process.  A copy of this note was sent to the patient's referring provider.   45 minutes of total time was spent for this patient encounter, including preparation, face-to-face counseling with the patient and coordination of care, and documentation of the encounter.  Jeral Pinch, MD  Division of Gynecologic Oncology  Department of Obstetrics and Gynecology  [facility]   ___________________________________________  Chief Complaint: Chief Complaint  Patient presents with  . Cyst of right ovary    New Patient    History of Present Illness:  [name] is a 79 y.o. y.o. female who is seen in consultation at the request of Dr. Everlene Farrier for an evaluation of complex adnexal mass.  The patient was seen by Dr. Faye Ramsay in early February after she was referred for by Dr. Wolfgang Phoenix for a right adnexal mass.  A remote pelvic CT in 2009 showed a right adnexal cyst.  More recently, she had a CT scan in January 2020 showing a 3.6 cm right adnexal cyst with no ascites or adenopathy.  The CT scan was performed secondary to right-sided abdominal pain with associated nausea, emesis, fatigue and a 15 pound weight loss over the preceding 4 months.  The adnexal lesion had only mildly creased in size since imaging in 2012, favoring a benign etiology.  Radiology recommended follow-up pelvic ultrasound.  The only other significant finding was diffuse gastric wall thickening.  In early February 2020, the patient had a pelvic ultrasound showing an anechoic mass measuring up to 3.7 cm without internal septations or solid components.  Follow-up ultrasound in September showed the lesion unchanged in size but questionable hyperechoic mural nodule measuring up to 9 mm.  An adjacent small cyst versus loculation was noted measuring up to 1.7 cm.  And MRI on 9/15 showed a thin-walled cystic lesion with a few thin internal septations in the right adnexa measuring 4 x 3.2 x 3.1 cm, with a  comment that there had not been significant change in size since 2012.  No peritoneal thickening or ascites noted.  Sigmoid diverticulosis noted.  Patient reports overall feeling well today. She notes that weight loss started in December 2019. She denies any change to her diet or appetite. She initially noted some early satiety but now says this has resolved. She often eats more than her husband. Recently, her  weight has fluctuated between 101-102. The lowest that she got down was 98 pounds. She denies any pelvic pain although notes that occasionally she gets right lower quadrant pain if she leans up against something. She is not bothered by this. She denies any vaginal bleeding or discharge. She reports normal bowel movements and denies any urinary symptoms.  Over the last few months, she was starting to feel shaky and had fluctuations noted in her blood pressure. She states that her blood pressure medications were changed and she is now worked on increasing her oral hydration as well as salt intake and overall feels much better.  Her family history is notable for rectal cancer in her brother. The patient has a history of colon cancer. She denies any family history of GYN or breast cancer.  PAST MEDICAL HISTORY:  Past Medical History:  Diagnosis Date  . Adenocarcinoma, colon ([place]) 03/2005   Stage I; resected in 03/2005  . Arthritis   . Colon cancer ([place])    Removed surgicaly   . Gastroesophageal reflux disease   . Gastroparesis   . Hearing loss    mild  . Hyperlipidemia   . Irregular heartbeat   . Ovarian cyst 2008  . Paroxysmal atrial fibrillation (HCC)    Palpitations; maintained on flecainide; -normal coronary angiography and 1999; negative stress nuclear study in 11/2005  . PONV (postoperative nausea and vomiting)   . Tobacco abuse, in remission    10-pack-years; quit in 2001     PAST SURGICAL HISTORY:  Past Surgical History:  Procedure Laterality Date  . ABDOMINAL HYSTERECTOMY  1983  . BACK SURGERY    . BIOPSY  10/18/2017   Procedure: BIOPSY;  Surgeon: [name], MD;  [facility];  Service: Endoscopy;;  gastric  . BIOPSY  07/06/2018   Procedure: BIOPSY;  Surgeon: [name], MD;  [facility];  Service: Endoscopy;;  gastric  . CHOLECYSTECTOMY    . COLON SURGERY  2006   Colon cancer  . COLONOSCOPY  03/25/2012   [name], MD  .  COLONOSCOPY N/A 04/26/2017   Procedure: COLONOSCOPY;  Surgeon: [name], MD;  [facility];  Service: Endoscopy;  Laterality: N/A;  2:00  . COLONOSCOPY N/A 07/06/2018   Procedure: COLONOSCOPY;  Surgeon: [name], MD;  [facility];  Service: Endoscopy;  Laterality: N/A;  10:30  . COSMETIC SURGERY  09/2007  . ESOPHAGOGASTRODUODENOSCOPY N/A 10/18/2017   Procedure: ESOPHAGOGASTRODUODENOSCOPY (EGD);  Surgeon: [name], MD;  [facility];  Service: Endoscopy;  Laterality: N/A;  . ESOPHAGOGASTRODUODENOSCOPY N/A 07/06/2018   Procedure: ESOPHAGOGASTRODUODENOSCOPY (EGD);  Surgeon: [name], MD;  [facility];  Service: Endoscopy;  Laterality: N/A;  . PARTIAL COLECTOMY  2006   adenocarcinoma  . POLYPECTOMY  04/26/2017   Procedure: POLYPECTOMY;  Surgeon: [name], MD;  [facility];  Service: Endoscopy;;  colon  . POLYPECTOMY  07/06/2018   Procedure: POLYPECTOMY;  Surgeon: [name], MD;  [facility];  Service: Endoscopy;;  colon   . TONSILLECTOMY      OB/GYN HISTORY:  OB History  Gravida Para Term Preterm AB Living  1 1          SAB TAB Ectopic Multiple Live Births               # Outcome Date GA Lbr Len/2nd Weight Sex Delivery Anes PTL Lv  1 Para             No LMP recorded. Patient has had a hysterectomy.  Age at menopause: unsure Hx of HRT: was on oral HRT for years, does not remember when she stopped it Hx of STDs: denies Last pap: 3-4 years ago History of abnormal pap smears: no  SCREENING STUDIES:  Last mammogram: 06/2018  Last colonoscopy: 05/2018  MEDICATIONS: Outpatient Encounter Medications as of 07/14/2019  Medication Sig  . Calcium Carbonate (CALCIUM 600 PO) Take by mouth.  Arne Cleveland 5 MG TABS tablet TAKE ONE TABLET BY MOUTH TWICE A DAY  . flecainide (TAMBOCOR) 100 MG tablet TAKE ONE (1) TABLET BY MOUTH EVERY 12 HOURS  . HYDROcodone-acetaminophen (NORCO) 10-325 MG  tablet TAKE ONE TABLET BY MOUTH THREE TIMES A DAY AS NEEDED FOR PAIN  . lactulose (CHRONULAC) 10 GM/15ML solution Take 20 g by mouth at bedtime.  . lipase/protease/amylase 24000-76000 units CPEP Take 1 capsule (24,000 Units total) by mouth 3 (three) times daily with meals.  . midodrine (PROAMATINE) 5 MG tablet Take 1 tablet (5 mg total) by mouth 3 (three) times daily with meals.  . NONFORMULARY OR COMPOUNDED ITEM Compounded med from [facility] C/diclofenac/gabapentin/lido 1%,5%,5% 100grams  . omeprazole (PRILOSEC) 20 MG capsule Take 20 mg by mouth daily before breakfast.   . rosuvastatin (CRESTOR) 40 MG tablet TAKE ONE (1) TABLET BY MOUTH EVERY DAY  . triamcinolone cream (KENALOG) 0.1 % Apply 1 application topically 2 (two) times daily. Up to 2 weeks  . vitamin B-12 (CYANOCOBALAMIN) 1000 MCG tablet Take 1,000 mcg by mouth daily.   No facility-administered encounter medications on file as of 07/14/2019.    ALLERGIES:  No Known Allergies   FAMILY HISTORY:  Family History  Problem Relation Age of Onset  . Heart disease Mother   . Stroke Mother   . Heart attack Mother   . Cancer Father        lung  . Heart disease Father   . Hypertension Father   . Cancer Brother        rectal  . Healthy Son   . Heart failure Sister   . Stroke Sister      SOCIAL HISTORY:    Social Connections:   . Frequency of Communication with Friends and Family: Not on file  . Frequency of Social Gatherings with Friends and Family: Not on file  . Attends Religious Services: Not on file  . Active Member of Clubs or Organizations: Not on file  . Attends Archivist Meetings: Not on file  . Marital Status: Not on file    REVIEW OF SYSTEMS:  Denies appetite changes, fevers, chills, fatigue, unexplained weight changes. Denies hearing loss, neck lumps or masses, mouth sores, ringing in ears or voice changes. Denies cough or wheezing.  Denies shortness of breath. Denies chest pain or palpitations.  Denies leg swelling. Denies abdominal distention, pain, blood in stools, constipation, diarrhea, nausea, vomiting, or early satiety. Denies pain with intercourse, dysuria, frequency, hematuria or incontinence. Denies hot flashes, pelvic pain, vaginal bleeding or vaginal discharge.   Denies  joint pain, back pain or muscle pain/cramps. Denies itching, rash, or wounds. Denies dizziness, headaches, numbness or seizures. Denies swollen lymph nodes or glands, denies easy bruising or bleeding. Denies anxiety, depression, confusion, or decreased concentration.  Physical Exam:  Vital Signs for this encounter:  Blood pressure (!) 124/50, pulse 62, temperature 98.9 F (37.2 C), temperature source Temporal, height 5\' 2"  (1.575 m), weight 102 lb 2 oz (46.3 kg), SpO2 100 %. Body mass index is 18.68 kg/m. General: Thin, alert, oriented, no acute distress.  HEENT: Normocephalic, atraumatic. Sclera anicteric.  Chest: Clear to auscultation bilaterally. No wheezes, rhonchi, or rales. Cardiovascular: Regular rate and rhythm, no murmurs, rubs, or gallops.  Abdomen: Normoactive bowel sounds. Soft, nondistended, nontender to palpation. No masses or hepatosplenomegaly appreciated. No palpable fluid wave.  Extremities: Grossly normal range of motion. Warm, well perfused. No edema bilaterally.  Skin: No rashes or lesions.  Lymphatics: No cervical, supraclavicular, or inguinal adenopathy.  GU:  Normal external female genitalia. No lesions. No discharge or bleeding.             Bladder/urethra:  No lesions or masses, well supported bladder             Vagina: Moderately atrophic mucosa, cuff intact.             Cervix: Surgically absent.             Uterus: Surgically absent.             Adnexa: Smooth 3-4 cm mass appreciated at the cuff apex, better on rectovaginal exam. No associated nodularity.  Rectal: See above.  LABORATORY AND RADIOLOGIC DATA:  Outside medical records were reviewed to synthesize the above  history, along with the history and physical obtained during the visit.   Lab Results  Component Value Date   WBC 7.7 05/23/2018   HGB 12.6 05/23/2018   HCT 37.7 05/23/2018   PLT 294 05/23/2018   GLUCOSE 99 05/23/2018   CHOL 171 02/08/2018   TRIG 164 (H) 02/08/2018   HDL 68 02/08/2018   LDLCALC 70 02/08/2018   ALT 15 02/08/2018   AST 14 02/08/2018   NA 142 05/23/2018   K 4.3 05/23/2018   CL 105 05/23/2018   CREATININE 1.00 01/24/2019   BUN 24 05/23/2018   CO2 22 05/23/2018   TSH 1.000 05/23/2018   HGBA1C 5.4 03/01/2017   MRI pelvis 01/2019: IMPRESSION: 4 cm mildly complex right adnexal cystic lesion shows no significant change since 2012 CT, consistent with benign etiology. Sigmoid diverticulosis, without radiographic evidence of diverticulitis.  Pelvic ultrasound 01/11/19: Surgical absence of uterus with nonvisualization of LEFT ovary. Cystic RIGHT adnexal mass complicated by a septation and questionable 9 mm mural nodule. Characterization of this lesion by MR imaging with and without contrast recommended to exclude cystic ovarian neoplasm.  PET 06/2018: Incidental CT findings: Cystic lesion within the right adnexa measures 3 cm, image 240/3. On 06/08/2018 this measured 3.6 cm. No significant FDG uptake identified.  07/06/19: CA-125: 20.1  06/09/18: CEA: 3.1

## 2019-07-14 ENCOUNTER — Other Ambulatory Visit: Payer: Self-pay

## 2019-07-14 ENCOUNTER — Encounter: Payer: Self-pay | Admitting: Gynecologic Oncology

## 2019-07-14 ENCOUNTER — Inpatient Hospital Stay: Payer: Medicare Other | Attending: Gynecologic Oncology | Admitting: Gynecologic Oncology

## 2019-07-14 VITALS — BP 124/50 | HR 62 | Temp 98.9°F | Ht 62.0 in | Wt 102.1 lb

## 2019-07-14 DIAGNOSIS — N83201 Unspecified ovarian cyst, right side: Secondary | ICD-10-CM | POA: Diagnosis not present

## 2019-07-14 NOTE — Patient Instructions (Signed)
I will let Dr. Gaetano Net know that I think your ovarian mass can be followed closely.

## 2019-07-19 ENCOUNTER — Ambulatory Visit (HOSPITAL_COMMUNITY): Payer: Medicare Other

## 2019-07-21 DIAGNOSIS — Z23 Encounter for immunization: Secondary | ICD-10-CM | POA: Diagnosis not present

## 2019-07-25 ENCOUNTER — Ambulatory Visit (INDEPENDENT_AMBULATORY_CARE_PROVIDER_SITE_OTHER): Payer: Medicare Other | Admitting: Cardiology

## 2019-07-25 VITALS — BP 138/74 | HR 60 | Temp 97.7°F | Ht 62.0 in | Wt 101.0 lb

## 2019-07-25 DIAGNOSIS — R42 Dizziness and giddiness: Secondary | ICD-10-CM

## 2019-07-25 NOTE — Patient Instructions (Signed)

## 2019-07-25 NOTE — Progress Notes (Signed)
Clinical Summary [name] is a 79 y.o.female seen today for a focused follow up for ongoing issues with orthostatic hypotension and dizziness.   1. Palpitations/Dizziness/Orthostatic hypotension - no symptoms at rest - with moving around feels lightheaded, chest feels heavy with palpitations. When she sits down after some time it resolves. Comes on with exertion. - rare palpitations at rest.  - coffee 1/2 cup decaf, 1 glass of decaf tea at night, occasional juice, 1 ensure a day. Drinks 1 glass of water a day. N/V/D have resolved - one day had bp 80/40s.  Home orthostastics Lying down: 128/59 p 56 Sitting 115/62 p 60 Standing 95/59 p 70    08/2018 monitor: rare supraventricular and ventricular ectopy. 3 very short runs of atach longest 4 beats. No afib    - last visit we increased midodrine to 5mg  tid. Gave Rx for compression stockings.  - symptoms much improved. BPs 120s/55s, mild orthostatic change by her home bp checks but no significant symptoms.      Past Medical History:  Diagnosis Date  . Adenocarcinoma, colon ([place]) 03/2005   Stage I; resected in 03/2005  . Arthritis   . Colon cancer ([place])    Removed surgicaly   . Gastroesophageal reflux disease   . Gastroparesis   . Hearing loss    mild  . Hyperlipidemia   . Irregular heartbeat   . Ovarian cyst 2008  . Paroxysmal atrial fibrillation ([facility])    Palpitations; maintained on flecainide; -normal coronary angiography and 1999; negative stress nuclear study in 11/2005  . PONV (postoperative nausea and vomiting)   . Tobacco abuse, in remission    10-pack-years; quit in 2001     No Known Allergies   Current Outpatient Medications  Medication Sig Dispense Refill  . Calcium Carbonate (CALCIUM 600 PO) Take by mouth.    Arne Cleveland 5 MG TABS tablet TAKE ONE TABLET BY MOUTH TWICE A DAY 60 tablet 5  . flecainide (TAMBOCOR) 100 MG tablet TAKE ONE (1) TABLET BY MOUTH EVERY 12 HOURS 180 tablet 3  .  HYDROcodone-acetaminophen (NORCO) 10-325 MG tablet TAKE ONE TABLET BY MOUTH THREE TIMES A DAY AS NEEDED FOR PAIN 90 tablet 0  . lactulose (CHRONULAC) 10 GM/15ML solution Take 20 g by mouth at bedtime.    . lipase/protease/amylase 24000-76000 units CPEP Take 1 capsule (24,000 Units total) by mouth 3 (three) times daily with meals. 90 capsule 3  . midodrine (PROAMATINE) 5 MG tablet Take 1 tablet (5 mg total) by mouth 3 (three) times daily with meals. 270 tablet 3  . NONFORMULARY OR COMPOUNDED ITEM Compounded med from [facility] C/diclofenac/gabapentin/lido 1%,5%,5% 100grams    . omeprazole (PRILOSEC) 20 MG capsule Take 20 mg by mouth daily before breakfast.     . rosuvastatin (CRESTOR) 40 MG tablet TAKE ONE (1) TABLET BY MOUTH EVERY DAY 90 tablet 0  . triamcinolone cream (KENALOG) 0.1 % Apply 1 application topically 2 (two) times daily. Up to 2 weeks 30 g 3  . vitamin B-12 (CYANOCOBALAMIN) 1000 MCG tablet Take 1,000 mcg by mouth daily.     No current facility-administered medications for this visit.     Past Surgical History:  Procedure Laterality Date  . ABDOMINAL HYSTERECTOMY  1983  . BACK SURGERY    . BIOPSY  10/18/2017   Procedure: BIOPSY;  Surgeon: [name], MD;  [facility];  Service: Endoscopy;;  gastric  . BIOPSY  07/06/2018   Procedure: BIOPSY;  Surgeon:  [name], MD;  [facility];  Service: Endoscopy;;  gastric  . CHOLECYSTECTOMY    . COLON SURGERY  2006   Colon cancer  . COLONOSCOPY  03/25/2012   [name], MD  . COLONOSCOPY N/A 04/26/2017   Procedure: COLONOSCOPY;  Surgeon: [name], MD;  [facility];  Service: Endoscopy;  Laterality: N/A;  2:00  . COLONOSCOPY N/A 07/06/2018   Procedure: COLONOSCOPY;  Surgeon: [name], MD;  [facility];  Service: Endoscopy;  Laterality: N/A;  10:30  . COSMETIC SURGERY  09/2007  . ESOPHAGOGASTRODUODENOSCOPY N/A 10/18/2017   Procedure: ESOPHAGOGASTRODUODENOSCOPY  (EGD);  Surgeon: [name], MD;  [facility];  Service: Endoscopy;  Laterality: N/A;  . ESOPHAGOGASTRODUODENOSCOPY N/A 07/06/2018   Procedure: ESOPHAGOGASTRODUODENOSCOPY (EGD);  Surgeon: [name], MD;  [facility];  Service: Endoscopy;  Laterality: N/A;  . PARTIAL COLECTOMY  2006   adenocarcinoma  . POLYPECTOMY  04/26/2017   Procedure: POLYPECTOMY;  Surgeon: [name], MD;  [facility];  Service: Endoscopy;;  colon  . POLYPECTOMY  07/06/2018   Procedure: POLYPECTOMY;  Surgeon: [name], MD;  [facility];  Service: Endoscopy;;  colon   . TONSILLECTOMY       No Known Allergies    Family History  Problem Relation Age of Onset  . Heart disease Mother   . Stroke Mother   . Heart attack Mother   . Cancer Father        lung  . Heart disease Father   . Hypertension Father   . Cancer Brother        rectal  . Healthy Son   . Heart failure Sister   . Stroke Sister      Social History [name] reports that she quit smoking about 17 years ago. She started smoking about 56 years ago. She has a 12.00 pack-year smoking history. She has never used smokeless tobacco. [name] reports no history of alcohol use.   Review of Systems CONSTITUTIONAL: No weight loss, fever, chills, weakness or fatigue.  HEENT: Eyes: No visual loss, blurred vision, double vision or yellow sclerae.No hearing loss, sneezing, congestion, runny nose or sore throat.  SKIN: No rash or itching.  CARDIOVASCULAR: no chest pain, no palpitations.  RESPIRATORY: No shortness of breath, cough or sputum.  GASTROINTESTINAL: No anorexia, nausea, vomiting or diarrhea. No abdominal pain or blood.  GENITOURINARY: No burning on urination, no polyuria NEUROLOGICAL: No headache, dizziness, syncope, paralysis, ataxia, numbness or tingling in the extremities. No change in bowel or bladder control.  MUSCULOSKELETAL: No muscle, back pain, joint pain or  stiffness.  LYMPHATICS: No enlarged nodes. No history of splenectomy.  PSYCHIATRIC: No history of depression or anxiety.  ENDOCRINOLOGIC: No reports of sweating, cold or heat intolerance. No polyuria or polydipsia.  Marland Kitchen   Physical Examination Today's Vitals   07/25/19 1426  BP: 138/74  Pulse: 60  Temp: 97.7 F (36.5 C)  TempSrc: Temporal  Weight: 101 lb (45.8 kg)  Height: 5\' 2"  (1.575 m)   Body mass index is 18.47 kg/m.  Gen: resting comfortably, no acute distress HEENT: no scleral icterus, pupils equal round and reactive, no palptable cervical adenopathy,  CV Resp: Clear to auscultation bilaterally GI: abdomen is soft, non-tender, non-distended, normal bowel sounds, no hepatosplenomegaly MSK: extremities are warm, no edema.  Skin: warm, no rash Neuro:  no focal deficits Psych: appropriate affect  Diagnostic Studies 08/2018 holter monitor  48 hr holter monitor  Min HR 50, Max HR 103, Avg HR 68  Rare ventricular ectopy, 2 isolated PVCs  Rare supraventricular ectopy. Primarily PACs. Three short runs of atach, longest 4 beats.  Reported symptoms correlated with sinus rhythm    Assessment and Plan   1. Dizziness/palpitations/Orthostatic hypotension -symptoms much improved on midodrine. Continue current therapy, continue hydration and compression stockings  F/u 6 months  Arnoldo Lenis, M.D.

## 2019-07-31 ENCOUNTER — Ambulatory Visit: Payer: Medicare Other | Admitting: "Endocrinology

## 2019-08-02 ENCOUNTER — Telehealth: Payer: Self-pay | Admitting: Family Medicine

## 2019-08-02 NOTE — Telephone Encounter (Signed)
CBC, metabolic 7, lipid, liver, vitamin B12 Macrocytic anemia, hyperlipidemia, weight loss

## 2019-08-02 NOTE — Telephone Encounter (Signed)
Patient is requesting labs for physical on 4/12

## 2019-08-03 ENCOUNTER — Other Ambulatory Visit: Payer: Self-pay | Admitting: *Deleted

## 2019-08-03 ENCOUNTER — Ambulatory Visit (HOSPITAL_COMMUNITY): Payer: Medicare Other

## 2019-08-03 DIAGNOSIS — R634 Abnormal weight loss: Secondary | ICD-10-CM

## 2019-08-03 DIAGNOSIS — E785 Hyperlipidemia, unspecified: Secondary | ICD-10-CM

## 2019-08-03 DIAGNOSIS — D539 Nutritional anemia, unspecified: Secondary | ICD-10-CM

## 2019-08-03 DIAGNOSIS — Z79899 Other long term (current) drug therapy: Secondary | ICD-10-CM

## 2019-08-03 NOTE — Telephone Encounter (Signed)
Orders for lab work placed. Will call to notify pt.

## 2019-08-15 DIAGNOSIS — D539 Nutritional anemia, unspecified: Secondary | ICD-10-CM | POA: Diagnosis not present

## 2019-08-15 DIAGNOSIS — Z79899 Other long term (current) drug therapy: Secondary | ICD-10-CM | POA: Diagnosis not present

## 2019-08-15 DIAGNOSIS — R634 Abnormal weight loss: Secondary | ICD-10-CM | POA: Diagnosis not present

## 2019-08-15 DIAGNOSIS — E785 Hyperlipidemia, unspecified: Secondary | ICD-10-CM | POA: Diagnosis not present

## 2019-08-16 LAB — VITAMIN B12: Vitamin B-12: 1442 pg/mL — ABNORMAL HIGH (ref 232–1245)

## 2019-08-16 LAB — HEPATIC FUNCTION PANEL
ALT: 8 IU/L (ref 0–32)
AST: 16 IU/L (ref 0–40)
Albumin: 4.3 g/dL (ref 3.7–4.7)
Alkaline Phosphatase: 66 IU/L (ref 39–117)
Bilirubin Total: 0.3 mg/dL (ref 0.0–1.2)
Bilirubin, Direct: 0.1 mg/dL (ref 0.00–0.40)
Total Protein: 6.4 g/dL (ref 6.0–8.5)

## 2019-08-16 LAB — LIPID PANEL
Chol/HDL Ratio: 2.7 ratio (ref 0.0–4.4)
Cholesterol, Total: 149 mg/dL (ref 100–199)
HDL: 56 mg/dL (ref >39)
LDL Chol Calc (NIH): 69 mg/dL (ref 0–99)
Triglycerides: 142 mg/dL (ref 0–149)
VLDL Cholesterol Cal: 24 mg/dL (ref 5–40)

## 2019-08-16 LAB — CBC WITH DIFFERENTIAL/PLATELET
Basophils Absolute: 0 10*3/uL (ref 0.0–0.2)
Basos: 0 %
EOS (ABSOLUTE): 0.2 10*3/uL (ref 0.0–0.4)
Eos: 3 %
Hematocrit: 33.8 % — ABNORMAL LOW (ref 34.0–46.6)
Hemoglobin: 11 g/dL — ABNORMAL LOW (ref 11.1–15.9)
Immature Grans (Abs): 0 10*3/uL (ref 0.0–0.1)
Immature Granulocytes: 0 %
Lymphocytes Absolute: 3.4 10*3/uL — ABNORMAL HIGH (ref 0.7–3.1)
Lymphs: 47 %
MCH: 33.5 pg — ABNORMAL HIGH (ref 26.6–33.0)
MCHC: 32.5 g/dL (ref 31.5–35.7)
MCV: 103 fL — ABNORMAL HIGH (ref 79–97)
Monocytes Absolute: 0.6 10*3/uL (ref 0.1–0.9)
Monocytes: 8 %
Neutrophils Absolute: 3.1 10*3/uL (ref 1.4–7.0)
Neutrophils: 42 %
Platelets: 262 10*3/uL (ref 150–450)
RBC: 3.28 x10E6/uL — ABNORMAL LOW (ref 3.77–5.28)
RDW: 11.7 % (ref 11.7–15.4)
WBC: 7.4 10*3/uL (ref 3.4–10.8)

## 2019-08-16 LAB — BASIC METABOLIC PANEL
BUN/Creatinine Ratio: 17 (ref 12–28)
BUN: 19 mg/dL (ref 8–27)
CO2: 22 mmol/L (ref 20–29)
Calcium: 9.8 mg/dL (ref 8.7–10.3)
Chloride: 108 mmol/L — ABNORMAL HIGH (ref 96–106)
Creatinine, Ser: 1.13 mg/dL — ABNORMAL HIGH (ref 0.57–1.00)
GFR calc Af Amer: 55 mL/min/{1.73_m2} — ABNORMAL LOW (ref >59)
GFR calc non Af Amer: 47 mL/min/{1.73_m2} — ABNORMAL LOW (ref >59)
Glucose: 99 mg/dL (ref 65–99)
Potassium: 4.4 mmol/L (ref 3.5–5.2)
Sodium: 144 mmol/L (ref 134–144)

## 2019-08-21 ENCOUNTER — Other Ambulatory Visit: Payer: Self-pay

## 2019-08-21 ENCOUNTER — Ambulatory Visit (INDEPENDENT_AMBULATORY_CARE_PROVIDER_SITE_OTHER): Payer: Medicare Other | Admitting: Family Medicine

## 2019-08-21 ENCOUNTER — Telehealth: Payer: Self-pay | Admitting: Family Medicine

## 2019-08-21 VITALS — BP 112/70 | Temp 97.2°F | Ht 62.0 in | Wt 100.8 lb

## 2019-08-21 DIAGNOSIS — R7989 Other specified abnormal findings of blood chemistry: Secondary | ICD-10-CM | POA: Diagnosis not present

## 2019-08-21 DIAGNOSIS — R634 Abnormal weight loss: Secondary | ICD-10-CM

## 2019-08-21 DIAGNOSIS — N9489 Other specified conditions associated with female genital organs and menstrual cycle: Secondary | ICD-10-CM

## 2019-08-21 DIAGNOSIS — Z85038 Personal history of other malignant neoplasm of large intestine: Secondary | ICD-10-CM | POA: Diagnosis not present

## 2019-08-21 DIAGNOSIS — E7849 Other hyperlipidemia: Secondary | ICD-10-CM | POA: Diagnosis not present

## 2019-08-21 DIAGNOSIS — Z0000 Encounter for general adult medical examination without abnormal findings: Secondary | ICD-10-CM | POA: Diagnosis not present

## 2019-08-21 DIAGNOSIS — Z79899 Other long term (current) drug therapy: Secondary | ICD-10-CM

## 2019-08-21 DIAGNOSIS — D509 Iron deficiency anemia, unspecified: Secondary | ICD-10-CM | POA: Diagnosis not present

## 2019-08-21 NOTE — Telephone Encounter (Signed)
Pt would like copy of up to date lab work mailed to her.

## 2019-08-21 NOTE — Progress Notes (Signed)
Subjective:    Patient ID: [name], female    DOB: June 15, 1940, 79 y.o.   MRN: [id number]  HPI AWV- Annual Wellness Visit  The patient was seen for their annual wellness visit. The patient's past medical history, surgical history, and family history were reviewed. Pertinent vaccines were reviewed ( tetanus, pneumonia, shingles, flu) The patient's medication list was reviewed and updated.  The height and weight were entered.  BMI recorded in electronic record elsewhere  Cognitive screening was completed. Outcome of Mini - Cog: 5   Falls /depression screening electronically recorded within record elsewhere  Current tobacco usage: none (All patients who use tobacco were given written and verbal information on quitting)  Recent listing of emergency department/hospitalizations over the past year were reviewed.  current specialist the patient sees on a regular basis: none  Patient would like to discuss a possible Inguinal hernia that was mentioned at her last OB appt.  Medicare annual wellness visit patient questionnaire was reviewed.  A written screening schedule for the patient for the next 5-10 years was given. Appropriate discussion of followup regarding next visit was discussed. Medicare annual wellness visit, subsequent  Elevated serum creatinine - Plan: Microalbumin / creatinine urine ratio, BUN+Creat, CANCELED: BUN/Creatinine Ratio  Iron deficiency anemia, unspecified iron deficiency anemia type - Plan: Iron Binding Cap (TIBC)([facility]/Sun[facility]), Ferritin  Weight loss  Other hyperlipidemia  History of colon cancer - Plan: IFOBT POC (occult bld, rslt in office)  High risk medication use    Fall Risk  02/15/2018 02/15/2018 12/13/2017 10/12/2017 11/05/2015  Falls in the past year? No No No No No  Comment - - Emmi Telephone Survey: data to providers prior to load - -    .  Review of Systems  Constitutional: Negative for activity change, appetite change and  fatigue.  HENT: Negative for congestion and rhinorrhea.   Eyes: Negative for discharge.  Respiratory: Negative for cough, chest tightness and wheezing.   Cardiovascular: Negative for chest pain.  Gastrointestinal: Negative for abdominal pain, blood in stool and vomiting.  Endocrine: Negative for polyphagia.  Genitourinary: Negative for difficulty urinating and frequency.  Musculoskeletal: Negative for neck pain.  Skin: Negative for color change.  Allergic/Immunologic: Negative for environmental allergies and food allergies.  Neurological: Negative for weakness and headaches.  Psychiatric/Behavioral: Negative for agitation and behavioral problems.       Objective:   Physical Exam Constitutional:      Appearance: She is well-developed.  HENT:     Head: Normocephalic.     Right Ear: External ear normal.     Left Ear: External ear normal.  Eyes:     Pupils: Pupils are equal, round, and reactive to light.  Neck:     Thyroid: No thyromegaly.  Cardiovascular:     Rate and Rhythm: Normal rate and regular rhythm.     Heart sounds: Normal heart sounds. No murmur.  Pulmonary:     Effort: Pulmonary effort is normal. No respiratory distress.     Breath sounds: Normal breath sounds. No wheezing.  Abdominal:     General: Bowel sounds are normal. There is no distension.     Palpations: Abdomen is soft. There is no mass.     Tenderness: There is no abdominal tenderness.  Musculoskeletal:        General: No tenderness. Normal range of motion.     Cervical back: Normal range of motion.  Lymphadenopathy:     Cervical: No cervical adenopathy.  Skin:    General:  Skin is warm and dry.  Neurological:     Mental Status: She is alert and oriented to person, place, and time.     Motor: No abnormal muscle tone.  Psychiatric:        Behavior: Behavior normal.           Assessment & Plan:  1. Medicare annual wellness visit, subsequent Adult wellness-complete.wellness physical was  conducted today. Importance of diet and exercise were discussed in detail.  In addition to this a discussion regarding safety was also covered. We also reviewed over immunizations and gave recommendations regarding current immunization needed for age.  In addition to this additional areas were also touched on including: Preventative health exams needed:  Colonoscopy 2023  Patient was advised yearly wellness exam   2. Elevated serum creatinine Patient will repeat her blood work should but should be well-hydrated when she repeats it - Microalbumin / creatinine urine ratio - BUN+Creat  3. Iron deficiency anemia, unspecified iron deficiency anemia type Hemoglobin has gone down this could be related to not we will check additional lab work also check stool for blood - Iron Binding Cap (TIBC)([facility]/Sun[facility]) - Ferritin  4. Weight loss Patient's weight has been stabilized she lost some weight over the past couple years went through multiple testing unable to figure out the source other than loss of muscle mass and loss of weight related to aging  5. Other hyperlipidemia Patient with hyperlipidemia but her HDL reasonable not as good as it was but still reasonable  6. History of colon cancer Stool test for blood ordered she will get this done soon and return to [place] - IFOBT POC (occult bld, rslt in office); Future  7. High risk medication use See above  She also has a ovarian cystic growth that is felt to be benign she is seeing to specialist for this.  She will need a repeat pelvic ultrasound spring 2022 we will put this into the reminder file

## 2019-08-24 ENCOUNTER — Other Ambulatory Visit: Payer: Self-pay

## 2019-08-24 ENCOUNTER — Telehealth: Payer: Self-pay | Admitting: Family Medicine

## 2019-08-24 DIAGNOSIS — D509 Iron deficiency anemia, unspecified: Secondary | ICD-10-CM | POA: Diagnosis not present

## 2019-08-24 DIAGNOSIS — R7989 Other specified abnormal findings of blood chemistry: Secondary | ICD-10-CM | POA: Diagnosis not present

## 2019-08-24 DIAGNOSIS — Z85038 Personal history of other malignant neoplasm of large intestine: Secondary | ICD-10-CM

## 2019-08-24 LAB — IFOBT (OCCULT BLOOD): IFOBT: POSITIVE

## 2019-08-24 NOTE — Telephone Encounter (Signed)
Pt dropped off sample. Sample placed at nurse station.

## 2019-08-24 NOTE — Telephone Encounter (Signed)
IFOBT positive- see results

## 2019-08-25 LAB — IRON AND TIBC
Iron Saturation: 17 % (ref 15–55)
Iron: 70 ug/dL (ref 27–139)
Total Iron Binding Capacity: 408 ug/dL (ref 250–450)
UIBC: 338 ug/dL (ref 118–369)

## 2019-08-25 LAB — BUN+CREAT
BUN/Creatinine Ratio: 22 (ref 12–28)
BUN: 19 mg/dL (ref 8–27)
Creatinine, Ser: 0.87 mg/dL (ref 0.57–1.00)
GFR calc Af Amer: 75 mL/min/{1.73_m2} (ref >59)
GFR calc non Af Amer: 65 mL/min/{1.73_m2} (ref >59)

## 2019-08-25 LAB — MICROALBUMIN / CREATININE URINE RATIO
Creatinine, Urine: 204.5 mg/dL
Microalb/Creat Ratio: 9 mg/g creat (ref 0–29)
Microalbumin, Urine: 17.7 ug/mL

## 2019-08-25 LAB — FERRITIN: Ferritin: 35 ng/mL (ref 15–150)

## 2019-08-28 ENCOUNTER — Other Ambulatory Visit: Payer: Self-pay | Admitting: *Deleted

## 2019-08-28 ENCOUNTER — Ambulatory Visit (HOSPITAL_COMMUNITY)
Admission: RE | Admit: 2019-08-28 | Discharge: 2019-08-28 | Disposition: A | Discharge status: Home / Self Care | Payer: Medicare Other | Source: Ambulatory Visit | Attending: Family Medicine | Admitting: Family Medicine

## 2019-08-28 ENCOUNTER — Other Ambulatory Visit: Payer: Self-pay

## 2019-08-28 DIAGNOSIS — Z1231 Encounter for screening mammogram for malignant neoplasm of breast: Secondary | ICD-10-CM

## 2019-08-28 DIAGNOSIS — K921 Melena: Secondary | ICD-10-CM

## 2019-08-28 NOTE — Progress Notes (Signed)
Ab ref 

## 2019-08-28 NOTE — Telephone Encounter (Signed)
Referral was placed to GI patient aware of results

## 2019-09-04 DIAGNOSIS — R490 Dysphonia: Secondary | ICD-10-CM | POA: Diagnosis not present

## 2019-09-04 DIAGNOSIS — K219 Gastro-esophageal reflux disease without esophagitis: Secondary | ICD-10-CM | POA: Diagnosis not present

## 2019-09-04 DIAGNOSIS — H60542 Acute eczematoid otitis externa, left ear: Secondary | ICD-10-CM | POA: Diagnosis not present

## 2019-09-05 ENCOUNTER — Encounter: Payer: Self-pay | Admitting: Family Medicine

## 2019-09-05 ENCOUNTER — Encounter (INDEPENDENT_AMBULATORY_CARE_PROVIDER_SITE_OTHER): Payer: Self-pay | Admitting: Gastroenterology

## 2019-09-21 ENCOUNTER — Other Ambulatory Visit: Payer: Self-pay | Admitting: Family Medicine

## 2019-09-21 NOTE — Telephone Encounter (Signed)
Small course given till Dr. Nicki Reaper comes back.  Dr. Lovena Le

## 2019-09-22 ENCOUNTER — Other Ambulatory Visit: Payer: Self-pay | Admitting: Family Medicine

## 2019-09-22 ENCOUNTER — Other Ambulatory Visit: Payer: Self-pay | Admitting: Cardiology

## 2019-10-16 ENCOUNTER — Other Ambulatory Visit: Payer: Self-pay | Admitting: Cardiology

## 2019-10-25 ENCOUNTER — Other Ambulatory Visit: Payer: Self-pay | Admitting: Family Medicine

## 2019-10-25 MED ORDER — HYDROCODONE-ACETAMINOPHEN 10-325 MG PO TABS: ORAL_TABLET | ORAL | 0 refills | Status: DC

## 2019-10-25 NOTE — Telephone Encounter (Signed)
Jonni Sanger from [facility] contacted office to request refill on Hydrocodone 10-325 mg. Pt called office on 09/21/19 and Dr.Taylor did give 30 tablets. Please advise. Thank you

## 2019-10-25 NOTE — Addendum Note (Signed)
Addended by: Patsy Lager on: 10/25/2019 01:30 PM   Modules accepted: Orders

## 2019-10-25 NOTE — Telephone Encounter (Signed)
May have hydrocodone 10 mg/325, 1 taken 3 times daily as needed, #60, please pend, patient will need to do follow-up visit with [place] before having additional due to federal guidelines every 74-month visit for pain management etc.

## 2019-11-27 ENCOUNTER — Other Ambulatory Visit: Payer: Self-pay | Admitting: Family Medicine

## 2019-11-28 NOTE — Telephone Encounter (Signed)
Needs to do follow-up visit within 30 days please pend

## 2019-11-29 ENCOUNTER — Telehealth: Payer: Self-pay | Admitting: Family Medicine

## 2019-11-29 NOTE — Telephone Encounter (Signed)
Lipid, liver, metabolic 7, CBC Hyperlipidemia hypertension macrocytic anemia

## 2019-11-29 NOTE — Telephone Encounter (Signed)
Med Follow Up appt made for 8/19 pt wants to know if she needs blood work done

## 2019-11-30 ENCOUNTER — Other Ambulatory Visit: Payer: Self-pay | Admitting: *Deleted

## 2019-11-30 DIAGNOSIS — Z79899 Other long term (current) drug therapy: Secondary | ICD-10-CM

## 2019-11-30 DIAGNOSIS — R5383 Other fatigue: Secondary | ICD-10-CM

## 2019-11-30 DIAGNOSIS — E7849 Other hyperlipidemia: Secondary | ICD-10-CM

## 2019-11-30 DIAGNOSIS — D539 Nutritional anemia, unspecified: Secondary | ICD-10-CM

## 2019-11-30 NOTE — Telephone Encounter (Signed)
Patient notified and labs ordered  

## 2019-12-07 ENCOUNTER — Encounter (INDEPENDENT_AMBULATORY_CARE_PROVIDER_SITE_OTHER): Payer: Self-pay | Admitting: Gastroenterology

## 2019-12-07 ENCOUNTER — Other Ambulatory Visit: Payer: Self-pay

## 2019-12-07 ENCOUNTER — Ambulatory Visit (INDEPENDENT_AMBULATORY_CARE_PROVIDER_SITE_OTHER): Payer: Medicare Other | Admitting: Gastroenterology

## 2019-12-07 VITALS — BP 132/72 | HR 63 | Temp 98.4°F | Ht 62.0 in | Wt 101.0 lb

## 2019-12-07 DIAGNOSIS — D508 Other iron deficiency anemias: Secondary | ICD-10-CM

## 2019-12-07 NOTE — Progress Notes (Signed)
Patient profile: [name] is a 79 y.o. female seen for f/up. Hx of sigmoid colon cancer w/ resection in 2006. She was seen February 2020 for colonoscopy.  History of Present Illness: [name] is seen today for follow up. She reports bowels are regular on lactulose nightly, takes 3 teaspoons each night and with lactulose having regular bowel movements daily.  Denies constipation or diarrhea.   She reports starting iron in April, doesn't recall having black stools prior to starting iron. No abdominal pain.   Takes prilosec 20mg  daily which controls GERD well. She denies any vomiting or dysphagia. She does have chronic nausea which is unchanged recently.  Takes norco 2-3x/day.   Denies fatigue/SOB since she was told she was anemic.   Wt Readings from Last 3 Encounters:  12/07/19 101 lb (45.8 kg)  08/21/19 100 lb 12.8 oz (45.7 kg)  07/25/19 101 lb (45.8 kg)  06/2018--Weight 103.   Last Colonoscopy: 06/2018-- One 8 to 10 mm polyp in the cecum, removed piecemeal using a hot snare. Resected and retrieved. Treated with argon plasma coagulation (APC). - One small polyp in the proximal transverse colon, removed with a cold snare. Resected and retrieved. - Patent end-to-side colo-colonic anastomosis, characterized by healthy appearing mucosa. - External hemorrhoids.   Last Endoscopy: 06/2018-- Normal esophagus. - Z-line regular, 38 cm from the incisors. - 2 cm hiatal hernia. - Three gastric polyps. Biopsied. - normal antral mucosa. Biopsied. - Normal duodenal bulb and second portion of the duodenum  PATH---Gastric biopsy negative for H. Pylori. Cecal polyp is a tubular adenoma without dysplasia. Other polyp is tubular adenoma as well. Results given to patient. Next colonoscopy in 3 years  Past Medical History:  Past Medical History:  Diagnosis Date   Adenocarcinoma, colon ([place]) 03/2005   Stage I; resected in 03/2005   Arthritis    Colon cancer ([place])    Removed  surgicaly    Gastroesophageal reflux disease    Gastroparesis    Hearing loss    mild   Hyperlipidemia    Irregular heartbeat    Ovarian cyst 2008   Paroxysmal atrial fibrillation ([facility])    Palpitations; maintained on flecainide; -normal coronary angiography and 1999; negative stress nuclear study in 11/2005   PONV (postoperative nausea and vomiting)    Tobacco abuse, in remission    10-pack-years; quit in 2001    Problem List: Patient Active Problem List   Diagnosis Date Noted   Loss of weight 01/23/2019   Adnexal mass 01/13/2019   Chronic pain syndrome 11/08/2018   Abnormal CT of the abdomen 06/23/2018   Gastric wall thickening 06/23/2018   History of colonic polyps 04/27/2018   History of colon cancer 04/27/2018   Family hx of colon cancer 04/27/2018   Early satiety 09/20/2017   Malignant neoplasm of colon ([place]) 01/21/2017   Sciatica of right side 03/06/2015   Knee pain, left 03/13/2014   Pain in joint, ankle and foot 03/13/2014   Muscle weakness (generalized) 01/10/2014   Difficulty in walking(719.7) 01/10/2014   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   Sciatica of left side 04/26/2013   Chest pain at rest 03/21/2013   Prediabetes 12/30/2012   Paroxysmal atrial fibrillation ([place])    ADENOCARCINOMA, COLON 06/24/2009   GASTROESOPHAGEAL REFLUX DISEASE 06/24/2009   Hyperlipidemia 06/12/2009    Past Surgical History: Past Surgical History:  Procedure Laterality Date   ABDOMINAL HYSTERECTOMY  1983   BACK SURGERY     BIOPSY  10/18/2017   Procedure: BIOPSY;  Surgeon: [name], MD;  [facility];  Service: Endoscopy;;  gastric   BIOPSY  07/06/2018   Procedure: BIOPSY;  Surgeon: [name], MD;  [facility];  Service: Endoscopy;;  gastric   CHOLECYSTECTOMY     COLON SURGERY  2006   Colon cancer   COLONOSCOPY  03/25/2012   [name], MD   COLONOSCOPY N/A 04/26/2017    Procedure: COLONOSCOPY;  Surgeon: [name], MD;  [facility];  Service: Endoscopy;  Laterality: N/A;  2:00   COLONOSCOPY N/A 07/06/2018   Procedure: COLONOSCOPY;  Surgeon: [name], MD;  [facility];  Service: Endoscopy;  Laterality: N/A;  10:30   COSMETIC SURGERY  09/2007   ESOPHAGOGASTRODUODENOSCOPY N/A 10/18/2017   Procedure: ESOPHAGOGASTRODUODENOSCOPY (EGD);  Surgeon: [name], MD;  [facility];  Service: Endoscopy;  Laterality: N/A;   ESOPHAGOGASTRODUODENOSCOPY N/A 07/06/2018   Procedure: ESOPHAGOGASTRODUODENOSCOPY (EGD);  Surgeon: [name], MD;  [facility];  Service: Endoscopy;  Laterality: N/A;   PARTIAL COLECTOMY  2006   adenocarcinoma   POLYPECTOMY  04/26/2017   Procedure: POLYPECTOMY;  Surgeon: [name], MD;  [facility];  Service: Endoscopy;;  colon   POLYPECTOMY  07/06/2018   Procedure: POLYPECTOMY;  Surgeon: [name], MD;  [facility];  Service: Endoscopy;;  colon    TONSILLECTOMY      Allergies: No Known Allergies    Home Medications:  Current Outpatient Medications:    Calcium Carbonate (CALCIUM 600 PO), Take by mouth., Disp: , Rfl:    ELIQUIS 5 MG TABS tablet, TAKE ONE TABLET BY MOUTH TWICE A DAY, Disp: 60 tablet, Rfl: 5   Ferrous Sulfate (IRON PO), Take by mouth. 1 tab once a day, Disp: , Rfl:    flecainide (TAMBOCOR) 100 MG tablet, TAKE 1 TABLET BY MOUTH EVERY 12 HOURS, Disp: 180 tablet, Rfl: 3   HYDROcodone-acetaminophen (NORCO) 10-325 MG tablet, TAKE ONE TABLET BY MOUTH THREE TIMES DAILY AS NEEDED FOR PAIN, Disp: 60 tablet, Rfl: 0   lactulose (CHRONULAC) 10 GM/15ML solution, Take 20 g by mouth at bedtime., Disp: , Rfl:    lipase/protease/amylase 24000-76000 units CPEP, Take 1 capsule (24,000 Units total) by mouth 3 (three) times daily with meals., Disp: 90 capsule, Rfl: 3   midodrine (PROAMATINE) 5 MG tablet, Take 1 tablet (5 mg total)  by mouth 3 (three) times daily with meals., Disp: 270 tablet, Rfl: 3   omeprazole (PRILOSEC) 20 MG capsule, Take 20 mg by mouth daily before breakfast. , Disp: , Rfl:    rosuvastatin (CRESTOR) 40 MG tablet, TAKE ONE (1) TABLET BY MOUTH EVERY DAY, Disp: 90 tablet, Rfl: 0   vitamin B-12 (CYANOCOBALAMIN) 1000 MCG tablet, Take 1,000 mcg by mouth daily., Disp: , Rfl:    NONFORMULARY OR COMPOUNDED ITEM, Compounded med from [facility] C/diclofenac/gabapentin/lido 1%,5%,5% 100grams (Patient not taking: Reported on 12/07/2019), Disp: , Rfl:    Family History: family history includes Cancer in her brother and father; Healthy in her son; Heart attack in her mother; Heart disease in her father and mother; Heart failure in her sister; Hypertension in her father; Stroke in her mother and sister.    Social History:   reports that she quit smoking about 17 years ago. She started smoking about 56 years ago. She has a 12.00 pack-year smoking history. She has never used smokeless tobacco. She  reports that she does not drink alcohol and does not use drugs.   Review of Systems: Constitutional: Denies weight loss/weight gain  Eyes: No changes in vision. ENT: No oral lesions, sore throat.  GI: see HPI.  Heme/Lymph: No easy bruising.  CV: No chest pain.  GU: No hematuria.  Integumentary: No rashes.  Neuro: No headaches.  Psych: No depression/anxiety.  Endocrine: No heat/cold intolerance.  Allergic/Immunologic: No urticaria.  Resp: No cough, SOB.  Musculoskeletal: No joint swelling.    Physical Examination: BP (!) 132/72 (BP Location: Right Arm, Patient Position: Sitting, Cuff Size: Normal)    Pulse 63    Temp 98.4 F (36.9 C) (Oral)    Ht 5\' 2"  (1.575 m)    Wt 101 lb (45.8 kg)    BMI 18.47 kg/m  Gen: NAD, alert and oriented x 4 HEENT: PEERLA, EOMI, Neck: supple, no JVD Chest: CTA bilaterally, no wheezes, crackles, or other adventitious sounds CV: RRR, no m/g/c/r Abd: soft, NT, ND, +BS in all four  quadrants; no HSM, guarding, ridigity, or rebound tenderness Ext: no edema, well perfused with 2+ pulses, Skin: no rash or lesions noted on observed skin Lymph: no noted LAD  Data:  08/2019--iFOBT positive, ferritin 35, TIBC 408, Iron 70, B12 elevated. CBC w/ Hgb 11.0, MCV 103, Hct 33.8. CA 125-normal.   Assessment/Plan: Ms. Mcbain is a 79 y.o. female  Diagnoses and all orders for this visit:  Other iron deficiency anemia -     CBC with Differential -     Fe+TIBC+Fer    1. Positive fecal occult blood-no obvious blood in stools. She had an endoscopy/colonoscopy 06/2018. Hemoglobin slightly low but macrocytic and may have a component of anemia of chronic disease as well. She is on chronic anticoagulation with eliquis. we will repeat her CBC & iron studies today to determine plan regarding repeat procedures for further evaluation.   I personally performed the service, non-incident to. (WP)  Laurine Blazer, [facility]

## 2019-12-07 NOTE — Patient Instructions (Signed)
We are checking labs and will call w/ results  

## 2019-12-08 LAB — CBC WITH DIFFERENTIAL/PLATELET
Absolute Monocytes: 601 cells/uL (ref 200–950)
Basophils Absolute: 8 cells/uL (ref 0–200)
Basophils Relative: 0.1 %
Eosinophils Absolute: 69 cells/uL (ref 15–500)
Eosinophils Relative: 0.9 %
HCT: 35.6 % (ref 35.0–45.0)
Hemoglobin: 11.9 g/dL (ref 11.7–15.5)
Lymphs Abs: 2287 cells/uL (ref 850–3900)
MCH: 33.8 pg — ABNORMAL HIGH (ref 27.0–33.0)
MCHC: 33.4 g/dL (ref 32.0–36.0)
MCV: 101.1 fL — ABNORMAL HIGH (ref 80.0–100.0)
MPV: 11.1 fL (ref 7.5–12.5)
Monocytes Relative: 7.8 %
Neutro Abs: 4736 cells/uL (ref 1500–7800)
Neutrophils Relative %: 61.5 %
Platelets: 290 10*3/uL (ref 140–400)
RBC: 3.52 10*6/uL — ABNORMAL LOW (ref 3.80–5.10)
RDW: 11.7 % (ref 11.0–15.0)
Total Lymphocyte: 29.7 %
WBC: 7.7 10*3/uL (ref 3.8–10.8)

## 2019-12-08 LAB — IRON,TIBC AND FERRITIN PANEL
Ferritin: 38 ng/mL (ref 16–288)
Iron: 145 ug/dL (ref 45–160)

## 2019-12-13 ENCOUNTER — Other Ambulatory Visit (INDEPENDENT_AMBULATORY_CARE_PROVIDER_SITE_OTHER): Payer: Self-pay | Admitting: Gastroenterology

## 2019-12-13 NOTE — Progress Notes (Signed)
I called patient w/ lab results and to review recommendations for [facility] Capsule study to look at small bowel given + occult.   No answer. LMOM for return call.

## 2019-12-14 ENCOUNTER — Telehealth: Payer: Self-pay | Admitting: Family Medicine

## 2019-12-14 ENCOUNTER — Telehealth (INDEPENDENT_AMBULATORY_CARE_PROVIDER_SITE_OTHER): Payer: Self-pay | Admitting: Gastroenterology

## 2019-12-14 DIAGNOSIS — I10 Essential (primary) hypertension: Secondary | ICD-10-CM

## 2019-12-14 DIAGNOSIS — Z79899 Other long term (current) drug therapy: Secondary | ICD-10-CM

## 2019-12-14 DIAGNOSIS — E7849 Other hyperlipidemia: Secondary | ICD-10-CM

## 2019-12-14 NOTE — Telephone Encounter (Signed)
I called patient again to discuss labs and small bowel capsule study.  No answer.  Left message on machine.

## 2019-12-14 NOTE — Telephone Encounter (Signed)
So this patient will need lipid liver metabolic 7 Does not need CBC from [place] Please adjust orders Diagnoses hypertension hyperlipidemia

## 2019-12-14 NOTE — Telephone Encounter (Signed)
Orders put in and tried to call pt and no answer

## 2019-12-14 NOTE — Telephone Encounter (Signed)
Patient left voice mail message regarding test results - please advise - ph# [phone]

## 2019-12-14 NOTE — Telephone Encounter (Signed)
Pt just had lab work done for Dr. Laural Golden and would like to know if Dr. Nicki Reaper has ordered the same lab work and if it needs to be repeated if so.

## 2019-12-15 NOTE — Telephone Encounter (Signed)
Patient notified and verbalized understanding. 

## 2019-12-18 ENCOUNTER — Other Ambulatory Visit (INDEPENDENT_AMBULATORY_CARE_PROVIDER_SITE_OTHER): Payer: Self-pay | Admitting: *Deleted

## 2019-12-18 ENCOUNTER — Encounter (INDEPENDENT_AMBULATORY_CARE_PROVIDER_SITE_OTHER): Payer: Self-pay | Admitting: *Deleted

## 2019-12-18 DIAGNOSIS — I10 Essential (primary) hypertension: Secondary | ICD-10-CM | POA: Diagnosis not present

## 2019-12-18 DIAGNOSIS — Z79899 Other long term (current) drug therapy: Secondary | ICD-10-CM | POA: Diagnosis not present

## 2019-12-18 DIAGNOSIS — E7849 Other hyperlipidemia: Secondary | ICD-10-CM | POA: Diagnosis not present

## 2019-12-18 NOTE — Telephone Encounter (Addendum)
Iron deficiency anemia and positive Hemoccult. Thanks Ann!

## 2019-12-18 NOTE — Telephone Encounter (Signed)
Patient came into office and discussed recommendations - she is agreeable to capsule endoscopy

## 2019-12-18 NOTE — Telephone Encounter (Signed)
[name]-patient states she will be home this afternoon after 3 PM if you would like to call her to schedule.

## 2019-12-18 NOTE — Telephone Encounter (Signed)
Patient sch'd 12/26/19 - I need diagonis so I can book it. Thanks

## 2019-12-19 LAB — HEPATIC FUNCTION PANEL
ALT: 17 IU/L (ref 0–32)
AST: 21 IU/L (ref 0–40)
Albumin: 4.3 g/dL (ref 3.7–4.7)
Alkaline Phosphatase: 75 IU/L (ref 48–121)
Bilirubin Total: <0.2 mg/dL (ref 0.0–1.2)
Bilirubin, Direct: 0.08 mg/dL (ref 0.00–0.40)
Total Protein: 6.3 g/dL (ref 6.0–8.5)

## 2019-12-19 LAB — BASIC METABOLIC PANEL
BUN/Creatinine Ratio: 23 (ref 12–28)
BUN: 21 mg/dL (ref 8–27)
CO2: 25 mmol/L (ref 20–29)
Calcium: 8.7 mg/dL (ref 8.7–10.3)
Chloride: 109 mmol/L — ABNORMAL HIGH (ref 96–106)
Creatinine, Ser: 0.93 mg/dL (ref 0.57–1.00)
GFR calc Af Amer: 69 mL/min/{1.73_m2} (ref >59)
GFR calc non Af Amer: 60 mL/min/{1.73_m2} (ref >59)
Glucose: 86 mg/dL (ref 65–99)
Potassium: 3.7 mmol/L (ref 3.5–5.2)
Sodium: 146 mmol/L — ABNORMAL HIGH (ref 134–144)

## 2019-12-19 LAB — LIPID PANEL
Chol/HDL Ratio: 2.8 ratio (ref 0.0–4.4)
Cholesterol, Total: 168 mg/dL (ref 100–199)
HDL: 61 mg/dL (ref >39)
LDL Chol Calc (NIH): 78 mg/dL (ref 0–99)
Triglycerides: 174 mg/dL — ABNORMAL HIGH (ref 0–149)
VLDL Cholesterol Cal: 29 mg/dL (ref 5–40)

## 2019-12-20 ENCOUNTER — Other Ambulatory Visit (INDEPENDENT_AMBULATORY_CARE_PROVIDER_SITE_OTHER): Payer: Self-pay | Admitting: *Deleted

## 2019-12-20 NOTE — Progress Notes (Signed)
Patient called our office today 12/20/2019 and ask that we please remove these two medications from her list as she does not use any longer. This was the Lipase/Protease/Amylase and the Non formulary or Compounded Item that [facility] compounded for her to use for her foot. She no longer uses this either.

## 2019-12-21 ENCOUNTER — Other Ambulatory Visit: Payer: Self-pay | Admitting: Family Medicine

## 2019-12-26 ENCOUNTER — Encounter (HOSPITAL_COMMUNITY)
Admission: RE | Disposition: A | Discharge status: Home / Self Care | Payer: Self-pay | Source: Home / Self Care | Attending: Internal Medicine

## 2019-12-26 ENCOUNTER — Ambulatory Visit (HOSPITAL_COMMUNITY)
Admission: RE | Admit: 2019-12-26 | Discharge: 2019-12-26 | Disposition: A | Discharge status: Home / Self Care | Payer: Medicare Other | Attending: Internal Medicine | Admitting: Internal Medicine

## 2019-12-26 ENCOUNTER — Encounter (HOSPITAL_COMMUNITY): Payer: Self-pay | Admitting: Internal Medicine

## 2019-12-26 DIAGNOSIS — Z85038 Personal history of other malignant neoplasm of large intestine: Secondary | ICD-10-CM | POA: Diagnosis not present

## 2019-12-26 DIAGNOSIS — I480 Paroxysmal atrial fibrillation: Secondary | ICD-10-CM | POA: Diagnosis not present

## 2019-12-26 DIAGNOSIS — K219 Gastro-esophageal reflux disease without esophagitis: Secondary | ICD-10-CM | POA: Diagnosis not present

## 2019-12-26 DIAGNOSIS — M1990 Unspecified osteoarthritis, unspecified site: Secondary | ICD-10-CM | POA: Diagnosis not present

## 2019-12-26 DIAGNOSIS — D500 Iron deficiency anemia secondary to blood loss (chronic): Secondary | ICD-10-CM | POA: Insufficient documentation

## 2019-12-26 DIAGNOSIS — Z87891 Personal history of nicotine dependence: Secondary | ICD-10-CM | POA: Diagnosis not present

## 2019-12-26 DIAGNOSIS — Z79899 Other long term (current) drug therapy: Secondary | ICD-10-CM | POA: Insufficient documentation

## 2019-12-26 DIAGNOSIS — D649 Anemia, unspecified: Secondary | ICD-10-CM | POA: Diagnosis present

## 2019-12-26 DIAGNOSIS — E785 Hyperlipidemia, unspecified: Secondary | ICD-10-CM | POA: Insufficient documentation

## 2019-12-26 DIAGNOSIS — Z7901 Long term (current) use of anticoagulants: Secondary | ICD-10-CM | POA: Diagnosis not present

## 2019-12-26 HISTORY — PX: GIVENS CAPSULE STUDY: SHX5432

## 2019-12-26 SURGERY — IMAGING PROCEDURE, GI TRACT, INTRALUMINAL, VIA CAPSULE

## 2019-12-26 NOTE — H&P (Signed)
[name] is an 79 y.o. female.   Chief Complaint: Patient is here for small bowel given capsule study. HPI: Patient is 79 year old Caucasian female who was found to be anemic few months ago.  Her stool was positive.  Patient has history of morbid 2020.  She also had esophagogastroduodenoscopy at the time of colonoscopy because of nausea and weight loss.  She did not have peptic ulcer disease or angiodysplasia. We therefore recommended that her small bowel be examined looking for source of GI blood loss.  Patient has atrial fibrillation and is on an anticoagulant.  No current facility-administered medications for this encounter.  Current Outpatient Medications:  .  HYDROcodone-acetaminophen (NORCO) 10-325 MG tablet, TAKE ONE TABLET BY MOUTH THREE TIMES DAILY AS NEEDED FOR PAIN, Disp: 60 tablet, Rfl: 0 .  Calcium Carbonate (CALCIUM 600 PO), Take by mouth., Disp: , Rfl:  .  ELIQUIS 5 MG TABS tablet, TAKE ONE TABLET BY MOUTH TWICE A DAY, Disp: 60 tablet, Rfl: 5 .  Ferrous Sulfate (IRON PO), Take by mouth. 1 tab once a day, Disp: , Rfl:  .  flecainide (TAMBOCOR) 100 MG tablet, TAKE 1 TABLET BY MOUTH EVERY 12 HOURS, Disp: 180 tablet, Rfl: 3 .  lactulose (CHRONULAC) 10 GM/15ML solution, Take 20 g by mouth at bedtime., Disp: , Rfl:  .  midodrine (PROAMATINE) 5 MG tablet, Take 1 tablet (5 mg total) by mouth 3 (three) times daily with meals., Disp: 270 tablet, Rfl: 3 .  omeprazole (PRILOSEC) 20 MG capsule, Take 20 mg by mouth daily before breakfast. , Disp: , Rfl:  .  rosuvastatin (CRESTOR) 40 MG tablet, TAKE ONE (1) TABLET BY MOUTH EVERY DAY, Disp: 90 tablet, Rfl: 0 .  vitamin B-12 (CYANOCOBALAMIN) 1000 MCG tablet, Take 1,000 mcg by mouth daily., Disp: , Rfl:   Past Medical History:  Diagnosis Date  . Adenocarcinoma, colon ([place]) 03/2005   Stage I; resected in 03/2005  . Arthritis   . Colon cancer ([place])    Removed surgicaly   . Gastroesophageal reflux disease   . Gastroparesis   . Hearing loss     mild  . Hyperlipidemia   . Irregular heartbeat   . Ovarian cyst 2008  . Paroxysmal atrial fibrillation (HCC)    Palpitations; maintained on flecainide; -normal coronary angiography and 1999; negative stress nuclear study in 11/2005  . PONV (postoperative nausea and vomiting)   . Tobacco abuse, in remission    10-pack-years; quit in 2001    Past Surgical History:  Procedure Laterality Date  . ABDOMINAL HYSTERECTOMY  1983  . BACK SURGERY    . BIOPSY  10/18/2017   Procedure: BIOPSY;  Surgeon: [name], MD;  [facility];  Service: Endoscopy;;  gastric  . BIOPSY  07/06/2018   Procedure: BIOPSY;  Surgeon: [name], MD;  [facility];  Service: Endoscopy;;  gastric  . CHOLECYSTECTOMY    . COLON SURGERY  2006   Colon cancer  . COLONOSCOPY  03/25/2012   [name], MD  . COLONOSCOPY N/A 04/26/2017   Procedure: COLONOSCOPY;  Surgeon: [name], MD;  [facility];  Service: Endoscopy;  Laterality: N/A;  2:00  . COLONOSCOPY N/A 07/06/2018   Procedure: COLONOSCOPY;  Surgeon: [name], MD;  [facility];  Service: Endoscopy;  Laterality: N/A;  10:30  . COSMETIC SURGERY  09/2007  . ESOPHAGOGASTRODUODENOSCOPY N/A 10/18/2017   Procedure: ESOPHAGOGASTRODUODENOSCOPY (EGD);  Surgeon: [name], MD;  [facility];  Service: Endoscopy;  Laterality: N/A;  . ESOPHAGOGASTRODUODENOSCOPY N/A 07/06/2018   Procedure: ESOPHAGOGASTRODUODENOSCOPY (EGD);  Surgeon: [name], MD;  [facility];  Service: Endoscopy;  Laterality: N/A;  . PARTIAL COLECTOMY  2006   adenocarcinoma  . POLYPECTOMY  04/26/2017   Procedure: POLYPECTOMY;  Surgeon: [name], MD;  [facility];  Service: Endoscopy;;  colon  . POLYPECTOMY  07/06/2018   Procedure: POLYPECTOMY;  Surgeon: [name], MD;  [facility];  Service: Endoscopy;;  colon   . TONSILLECTOMY      Family History   Problem Relation Age of Onset  . Heart disease Mother   . Stroke Mother   . Heart attack Mother   . Cancer Father        lung  . Heart disease Father   . Hypertension Father   . Cancer Brother        rectal  . Healthy Son   . Heart failure Sister   . Stroke Sister    Social History:  reports that she quit smoking about 17 years ago. She started smoking about 56 years ago. She has a 12.00 pack-year smoking history. She has never used smokeless tobacco. She reports that she does not drink alcohol and does not use drugs.  Allergies: No Known Allergies  No medications prior to admission.    No results found for this or any previous visit (from the past 48 hour(s)). No results found.  Review of Systems  Height 5\' 2"  (1.575 m). Physical Exam   Assessment/Plan  Anemia secondary to occult GI bleed. Patient had EGD and colonoscopy in February 2020. Given capsule study looking for source of GI blood loss from small intestine.  Hildred Laser, MD 12/26/2019, 8:49 AM

## 2019-12-28 ENCOUNTER — Encounter (HOSPITAL_COMMUNITY): Payer: Self-pay | Admitting: Internal Medicine

## 2019-12-28 ENCOUNTER — Ambulatory Visit (INDEPENDENT_AMBULATORY_CARE_PROVIDER_SITE_OTHER): Payer: Medicare Other | Admitting: Family Medicine

## 2019-12-28 ENCOUNTER — Other Ambulatory Visit: Payer: Self-pay

## 2019-12-28 VITALS — BP 118/74 | Temp 96.5°F | Wt 102.2 lb

## 2019-12-28 DIAGNOSIS — E7849 Other hyperlipidemia: Secondary | ICD-10-CM

## 2019-12-28 DIAGNOSIS — H00015 Hordeolum externum left lower eyelid: Secondary | ICD-10-CM | POA: Diagnosis not present

## 2019-12-28 DIAGNOSIS — M5440 Lumbago with sciatica, unspecified side: Secondary | ICD-10-CM | POA: Diagnosis not present

## 2019-12-28 DIAGNOSIS — G8929 Other chronic pain: Secondary | ICD-10-CM

## 2019-12-28 MED ORDER — CEPHALEXIN 500 MG PO CAPS
500.0000 mg | ORAL_CAPSULE | Freq: Three times a day (TID) | ORAL | 0 refills | Status: DC
Start: 2019-12-28 — End: 2020-03-26

## 2019-12-28 MED ORDER — HYDROCODONE-ACETAMINOPHEN 10-325 MG PO TABS: ORAL_TABLET | ORAL | 0 refills | Status: DC

## 2019-12-28 NOTE — Patient Instructions (Signed)
Results for orders placed or performed in visit on 12/14/19  Lipid panel  Result Value Ref Range   Cholesterol, Total 168 100 - 199 mg/dL   Triglycerides 174 (H) 0 - 149 mg/dL   HDL 61 >39 mg/dL   VLDL Cholesterol Cal 29 5 - 40 mg/dL   LDL Chol Calc (NIH) 78 0 - 99 mg/dL   Chol/HDL Ratio 2.8 0.0 - 4.4 ratio  Hepatic function panel  Result Value Ref Range   Total Protein 6.3 6.0 - 8.5 g/dL   Albumin 4.3 3.7 - 4.7 g/dL   Bilirubin Total <0.2 0.0 - 1.2 mg/dL   Bilirubin, Direct 0.08 0.00 - 0.40 mg/dL   Alkaline Phosphatase 75 48 - 121 IU/L   AST 21 0 - 40 IU/L   ALT 17 0 - 32 IU/L  Basic metabolic panel  Result Value Ref Range   Glucose 86 65 - 99 mg/dL   BUN 21 8 - 27 mg/dL   Creatinine, Ser 0.93 0.57 - 1.00 mg/dL   GFR calc non Af Amer 60 >59 mL/min/1.73   GFR calc Af Amer 69 >59 mL/min/1.73   BUN/Creatinine Ratio 23 12 - 28   Sodium 146 (H) 134 - 144 mmol/L   Potassium 3.7 3.5 - 5.2 mmol/L   Chloride 109 (H) 96 - 106 mmol/L   CO2 25 20 - 29 mmol/L   Calcium 8.7 8.7 - 10.3 mg/dL

## 2019-12-28 NOTE — Progress Notes (Signed)
Subjective:    Patient ID: [name], female    DOB: 1940/07/07, 79 y.o.   MRN: [id number]  HPI Pt here for med check. Pt is taking Hydrocodone 10-325 TID; Omeprazole and Rosuvastatin.   Pt states her BP has been elevated. BP used to stay low but now the top number has become higher than normal. Pt checks BP TID.   Pt had endoscopy done on 12/26/19 and is wanting to know if Dr.Toniette Devera or Dr.Rourke will go over results.   This patient was seen today for chronic pain  The medication list was reviewed and updated.   -Compliance with medication: Relates compliance  - Number patient states they take daily: Takes 2 or 3 every day  -when was the last dose patient took?  Earlier today  The patient was advised the importance of maintaining medication and not using illegal substances with these.  Here for refills and follow up  The patient was educated that we can provide 3 monthly scripts for their medication, it is their responsibility to follow the instructions.  Side effects or complications from medications: States it does not cause any side effects  Patient is aware that pain medications are meant to minimize the severity of the pain to allow their pain levels to improve to allow for better function. They are aware of that pain medications cannot totally remove their pain.  Due for UDT ( at least once per year) : Yearly  Scale of 1 to 10 ( 1 is least 10 is most) Your pain level without the medicine: 8 Your pain level with medication for  Scale 1 to 10 ( 1-helps very little, 10 helps very well) How well does your pain medication reduce your pain so you can function better through out the day?  7 Patient states without the medicine she does not feel she can function  Patient does have chronic back pain from previous surgeries    Review of Systems  Constitutional: Negative for activity change, appetite change and fatigue.  HENT: Negative for congestion and rhinorrhea.     Respiratory: Negative for cough and shortness of breath.   Cardiovascular: Negative for chest pain and leg swelling.  Gastrointestinal: Negative for abdominal pain and diarrhea.  Endocrine: Negative for polydipsia and polyphagia.  Musculoskeletal: Positive for back pain. Negative for arthralgias.  Skin: Negative for color change.  Neurological: Negative for dizziness and weakness.  Psychiatric/Behavioral: Negative for behavioral problems and confusion.       Objective:   Physical Exam Vitals reviewed.  Constitutional:      General: She is not in acute distress. HENT:     Head: Normocephalic and atraumatic.  Eyes:     General:        Right eye: No discharge.        Left eye: No discharge.  Neck:     Trachea: No tracheal deviation.  Cardiovascular:     Rate and Rhythm: Normal rate and regular rhythm.     Heart sounds: Normal heart sounds. No murmur heard.   Pulmonary:     Effort: Pulmonary effort is normal. No respiratory distress.     Breath sounds: Normal breath sounds.  Lymphadenopathy:     Cervical: No cervical adenopathy.  Skin:    General: Skin is warm and dry.  Neurological:     Mental Status: She is alert.     Coordination: Coordination normal.  Psychiatric:        Behavior: Behavior normal.  Assessment & Plan:  The patient was seen in followup for chronic pain. A review over at their current pain status was discussed. Drug registry was checked. Prescriptions were given.  Regular follow-up recommended. Discussion was held regarding the importance of compliance with medication as well as pain medication contract.  Patient was informed that medication may cause drowsiness and should not be combined  with other medications/alcohol or street drugs. If the patient feels medication is causing altered alertness then do not drive or operate dangerous equipment.  Drug registry checked 3 prescription sent in Follow-up in 3 months  She does have a stye  under her eye we will go ahead and treat with Keflex for 7 days follow-up if ongoing troubles Patient also has hyperlipidemia takes her medication recent lab work showed a slight increase compared to where it was but still within treatable range

## 2019-12-29 NOTE — Op Note (Signed)
Small Bowel Givens Capsule Study Procedure date: 12/26/2019  Referring Provider: Sallee Lange, MD PCP:  Dr. Kathyrn Drown, MD  Indication for procedure:   Patient is 79 year old Caucasian female with anemia secondary to GI bleed of occult origin. She has a history of colon carcinoma status post sigmoid colon resection 2006 who had EGD and colonoscopy in February last year and no bleeding lesion was identified.  Therefore we recommended small bowel study looking for source of GI blood loss.   Findings:   Patient was able to swallow given capsule without any difficulty. Study duration is 7 hours 59 minutes and 58 seconds.  Study however is incomplete as given capsule did not reach cecum. Two small jejunal diverticula noted best seen on images at 01:57:19 and 01:59:19. Two focal areas with edema and erythema noted.  These are best seen on images at 05:03:20 and 05:05:39.  Appearance consistent with focal ileitis.  First Gastric image: 1 min and 28 sec First Duodenal image: 58 min and 36 sec First Ileo-Cecal Valve image: Not applicable at cecum not reached. First Cecal image: Cecum not reached. Gastric Passage time: 57 min and 8 sec Small Bowel Passage time: Not applicable  Summary & Recommendations:  Small bowel study is incomplete as capsule did not reach cecum study approximately 8 hours. Two small jejunal diverticula without stigmata of bleed. Two areas with focal ileitis seen on 2 images as above.  These findings reviewed with patient. Proceed with CT enterography.

## 2020-01-26 ENCOUNTER — Ambulatory Visit: Payer: Medicare Other | Admitting: Cardiology

## 2020-02-14 NOTE — Progress Notes (Deleted)
Cardiology Office Note    Date:  02/14/2020   ID:  [name], [name] 1940/05/22, MRN [id number]  PCP:  Kathyrn Drown, MD  Cardiologist: Carlyle Dolly, MD EPS: None  No chief complaint on file.   History of Present Illness:  [name] is a 79 y.o. female with history of orthostatic hypotension, dizziness, palpitations with monitoring 08/2018 rare supraventricular and ventricular ectopy, 3 very short runs of atrial tachycardia longest 4 beats.  Dr. Harl Bowie 07/25/2019 at which time symptoms improved on midodrine hydration and compression stockings.  No changes made.  Past Medical History:  Diagnosis Date  . Adenocarcinoma, colon ([place]) 03/2005   Stage I; resected in 03/2005  . Arthritis   . Colon cancer ([place])    Removed surgicaly   . Gastroesophageal reflux disease   . Gastroparesis   . Hearing loss    mild  . Hyperlipidemia   . Irregular heartbeat   . Ovarian cyst 2008  . Paroxysmal atrial fibrillation (HCC)    Palpitations; maintained on flecainide; -normal coronary angiography and 1999; negative stress nuclear study in 11/2005  . PONV (postoperative nausea and vomiting)   . Tobacco abuse, in remission    10-pack-years; quit in 2001    Past Surgical History:  Procedure Laterality Date  . ABDOMINAL HYSTERECTOMY  1983  . BACK SURGERY    . BIOPSY  10/18/2017   Procedure: BIOPSY;  Surgeon: [name], MD;  [facility];  Service: Endoscopy;;  gastric  . BIOPSY  07/06/2018   Procedure: BIOPSY;  Surgeon: [name], MD;  [facility];  Service: Endoscopy;;  gastric  . CHOLECYSTECTOMY    . COLON SURGERY  2006   Colon cancer  . COLONOSCOPY  03/25/2012   [name], MD  . COLONOSCOPY N/A 04/26/2017   Procedure: COLONOSCOPY;  Surgeon: [name], MD;  [facility];  Service: Endoscopy;  Laterality: N/A;  2:00  . COLONOSCOPY N/A 07/06/2018   Procedure: COLONOSCOPY;  Surgeon: [name], MD;  [facility];  Service: Endoscopy;  Laterality: N/A;  10:30  . COSMETIC SURGERY  09/2007  . ESOPHAGOGASTRODUODENOSCOPY N/A 10/18/2017   Procedure: ESOPHAGOGASTRODUODENOSCOPY (EGD);  Surgeon: [name], MD;  [facility];  Service: Endoscopy;  Laterality: N/A;  . ESOPHAGOGASTRODUODENOSCOPY N/A 07/06/2018   Procedure: ESOPHAGOGASTRODUODENOSCOPY (EGD);  Surgeon: [name], MD;  [facility];  Service: Endoscopy;  Laterality: N/A;  . GIVENS CAPSULE STUDY N/A 12/26/2019   Procedure: GIVENS CAPSULE STUDY;  Surgeon: [name], MD;  [facility];  Service: Endoscopy;  Laterality: N/A;  730  . PARTIAL COLECTOMY  2006   adenocarcinoma  . POLYPECTOMY  04/26/2017   Procedure: POLYPECTOMY;  Surgeon: [name], MD;  [facility];  Service: Endoscopy;;  colon  . POLYPECTOMY  07/06/2018   Procedure: POLYPECTOMY;  Surgeon: [name], MD;  [facility];  Service: Endoscopy;;  colon   . TONSILLECTOMY      Current Medications: No outpatient medications have been marked as taking for the 02/19/20 encounter (Appointment) with Imogene Burn, PA-C.     Allergies:   Patient has no known allergies.   Social History   Socioeconomic History  . Marital status: Married    Spouse name: Not on file  . Number of children: Not on file  . Years of education: Not on file  . Highest education  level: Not on file  Occupational History  . Occupation: Radiation protection practitioner  Tobacco Use  . Smoking status: Former Smoker    Packs/day: 0.30    Years: 40.00    Pack years: 12.00    Start date: 02/23/1963    Quit date: 03/25/2002    Years since quitting: 17.9  . Smokeless tobacco: Never Used  . Tobacco comment: Quit smoking in 2001  Vaping Use  . Vaping Use: Never used  Substance and Sexual Activity  . Alcohol use: No    Alcohol/week: 0.0 standard drinks  . Drug use: No  . Sexual activity: Yes    Birth control/protection: Surgical  Other  Topics Concern  . Not on file  Social History Narrative   Lives with husband in a one story home.  Has 3 children.  Retired from Medical sales representative work.  Education: some college.   Social Determinants of Health   Financial Resource Strain:   . Difficulty of Paying Living Expenses: Not on file  Food Insecurity:   . Worried About Charity fundraiser in the Last Year: Not on file  . Ran Out of Food in the Last Year: Not on file  Transportation Needs:   . Lack of Transportation (Medical): Not on file  . Lack of Transportation (Non-Medical): Not on file  Physical Activity:   . Days of Exercise per Week: Not on file  . Minutes of Exercise per Session: Not on file  Stress:   . Feeling of Stress : Not on file  Social Connections:   . Frequency of Communication with Friends and Family: Not on file  . Frequency of Social Gatherings with Friends and Family: Not on file  . Attends Religious Services: Not on file  . Active Member of Clubs or Organizations: Not on file  . Attends Archivist Meetings: Not on file  . Marital Status: Not on file     Family History:  The patient's ***family history includes Cancer in her brother and father; Healthy in her son; Heart attack in her mother; Heart disease in her father and mother; Heart failure in her sister; Hypertension in her father; Stroke in her mother and sister.   ROS:   Please see the history of present illness.    ROS All other systems reviewed and are negative.   PHYSICAL EXAM:   VS:  There were no vitals taken for this visit.  Physical Exam  GEN: Well nourished, well developed, in no acute distress  HEENT: normal  Neck: no JVD, carotid bruits, or masses Cardiac:RRR; no murmurs, rubs, or gallops  Respiratory:  clear to auscultation bilaterally, normal work of breathing GI: soft, nontender, nondistended, + BS Ext: without cyanosis, clubbing, or edema, Good distal pulses bilaterally MS: no deformity or atrophy  Skin: warm and dry,  no rash Neuro:  Alert and Oriented x 3, Strength and sensation are intact Psych: euthymic mood, full affect  Wt Readings from Last 3 Encounters:  12/28/19 102 lb 3.2 oz (46.4 kg)  12/07/19 101 lb (45.8 kg)  08/21/19 100 lb 12.8 oz (45.7 kg)      Studies/Labs Reviewed:   EKG:  EKG is*** ordered today.  The ekg ordered today demonstrates ***  Recent Labs: 12/07/2019: Hemoglobin 11.9; Platelets 290 12/18/2019: ALT 17; BUN 21; Creatinine, Ser 0.93; Potassium 3.7; Sodium 146   Lipid Panel    Component Value Date/Time   CHOL 168 12/18/2019 1337   TRIG 174 (H) 12/18/2019 1337   HDL 61 12/18/2019 1337  CHOLHDL 2.8 12/18/2019 1337   CHOLHDL 3.0 03/19/2014 0712   VLDL 36 03/19/2014 0712   LDLCALC 78 12/18/2019 1337    Additional studies/ records that were reviewed today include:  08/2018 holter monitor  48 hr holter monitor  Min HR 50, Max HR 103, Avg HR 68  Rare ventricular ectopy, 2 isolated PVCs  Rare supraventricular ectopy. Primarily PACs. Three short runs of atach, longest 4 beats.  Reported symptoms correlated with sinus rhythm         ASSESSMENT:    1. Orthostatic hypotension   2. Palpitations      PLAN:  In order of problems listed above:  Orthostatic hypotension  Palpitations Holter monitor 08/2018 rare supraventricular and ventricular ectopy 3 very short runs of atrial tachycardia longest 4 beats    Medication Adjustments/Labs and Tests Ordered: Current medicines are reviewed at length with the patient today.  Concerns regarding medicines are outlined above.  Medication changes, Labs and Tests ordered today are listed in the Patient Instructions below. There are no Patient Instructions on file for this visit.   Sumner Boast, PA-C  02/14/2020 11:49 AM    [facility] [place], [place],   [address] Phone: [phone]; Fax: [phone]

## 2020-02-19 ENCOUNTER — Ambulatory Visit: Payer: Medicare Other | Admitting: Physician Assistant

## 2020-02-29 IMAGING — CT CT ABD-PELV W/ CM
2 of 6 series · 15 of 46 positions shown, 17 images · IV contrast (Isovue)
Comparison: CT abdomen pelvis dated September 29, 2010.

CLINICAL DATA: Right-sided abdominal discomfort with nausea,
vomiting, fatigue, and 15 pound weight loss since last [REDACTED].
History of colon cancer status post resection.

EXAM:
CT ABDOMEN AND PELVIS WITH CONTRAST
TECHNIQUE: Multidetector CT imaging of the abdomen and pelvis was performed
using the standard protocol following bolus administration of
intravenous contrast.
CONTRAST:  100mL 63QNES-U22 IOPAMIDOL (63QNES-U22) INJECTION 61%

[Series 2: axial st · axial · 0.54mm/px · z∈[+1037,+1417]mm · 12 of 86 slices shown, 14 images]
[im 5/86  soft-tissue]
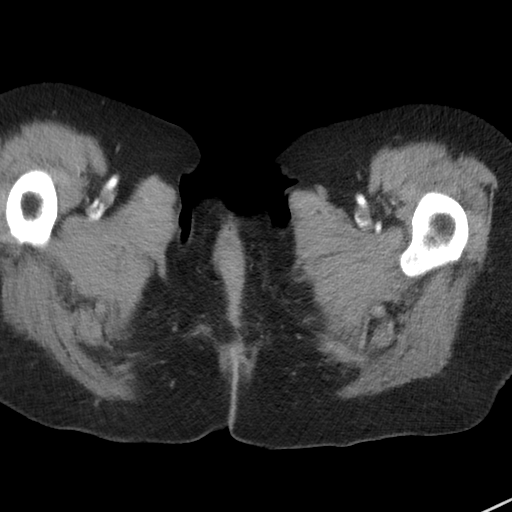
[im 5/86  bone]
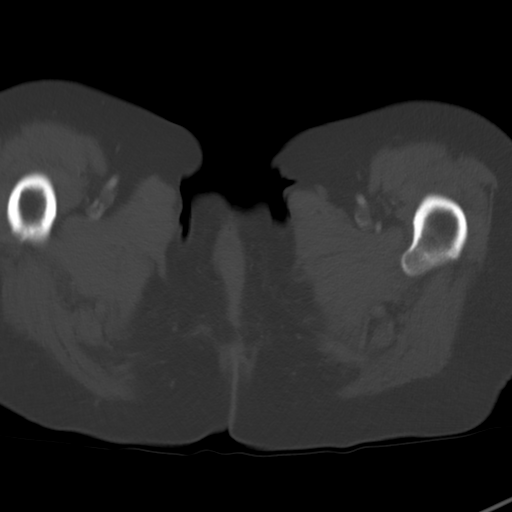
[im 15/86  soft-tissue]
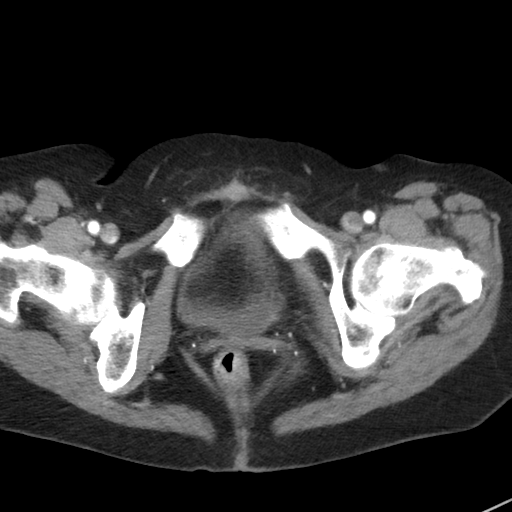
[im 19/86  soft-tissue]
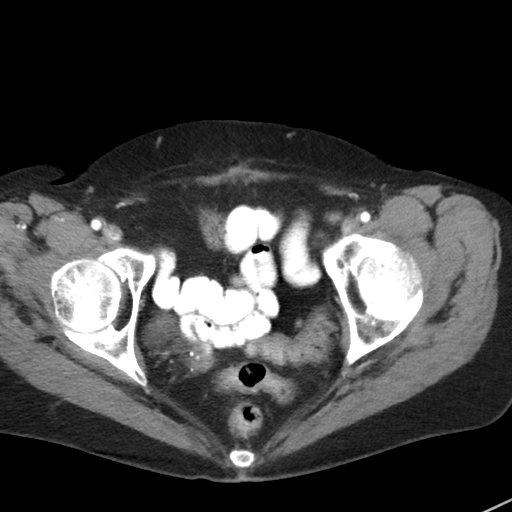
[im 24/86  soft-tissue]
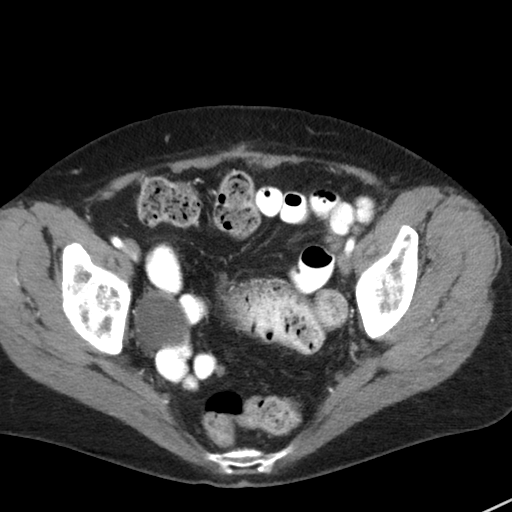
[im 34/86  soft-tissue]
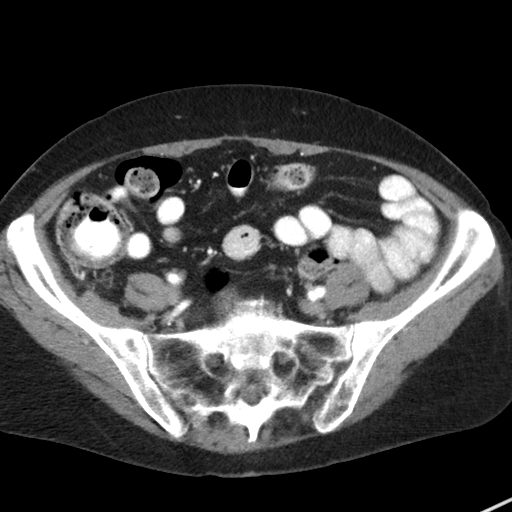
[im 38/86  soft-tissue]
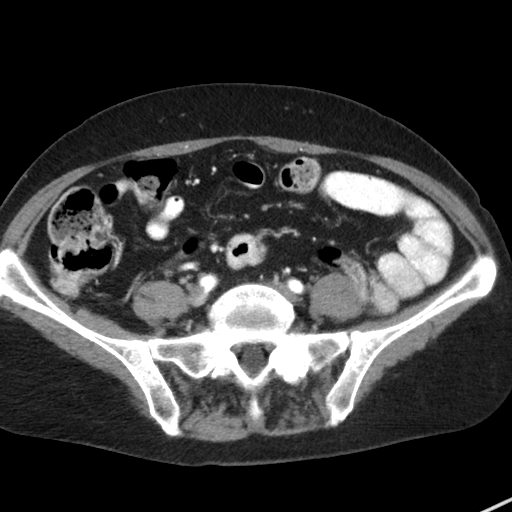
[im 48/86  soft-tissue]
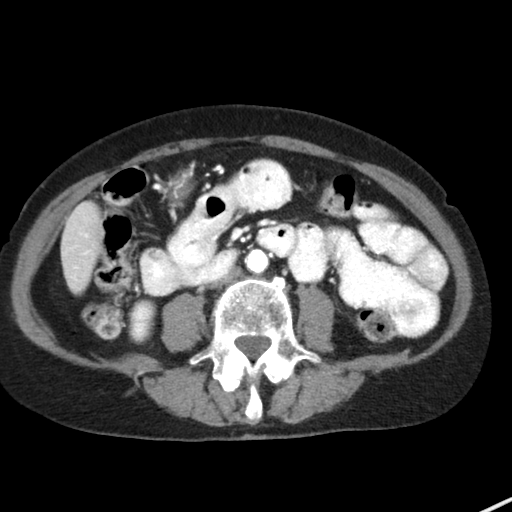
[im 52/86  soft-tissue]
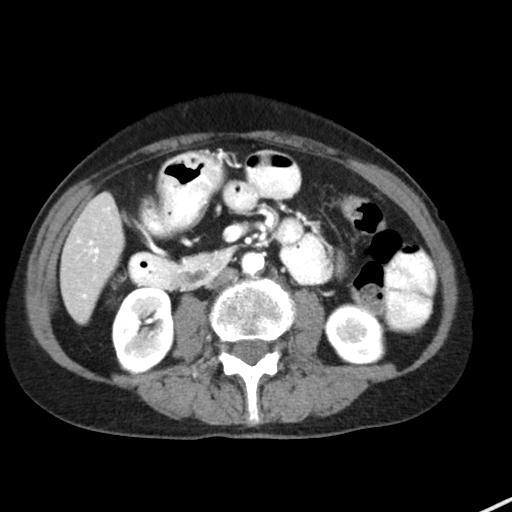
[im 62/86  soft-tissue]
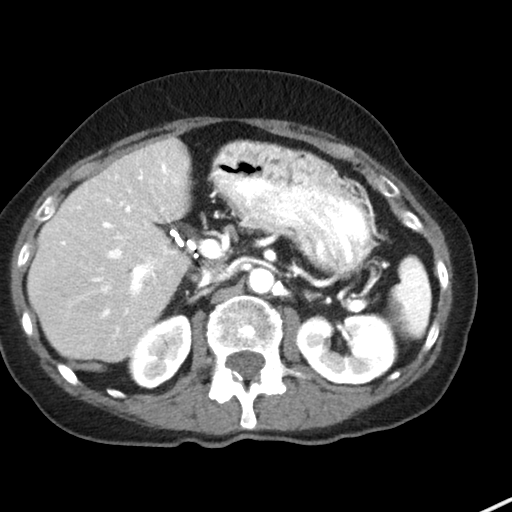
[im 62/86  bone]
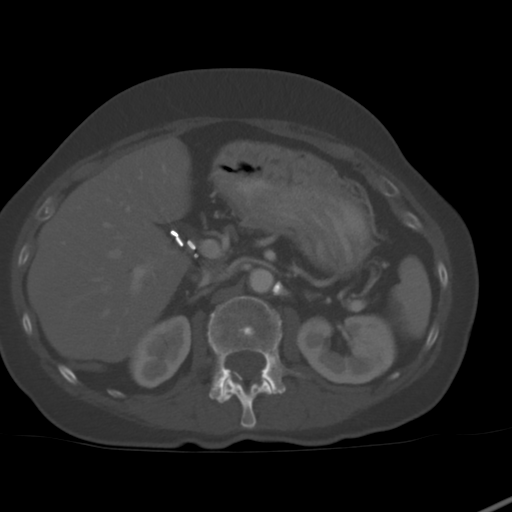
[im 67/86  soft-tissue]
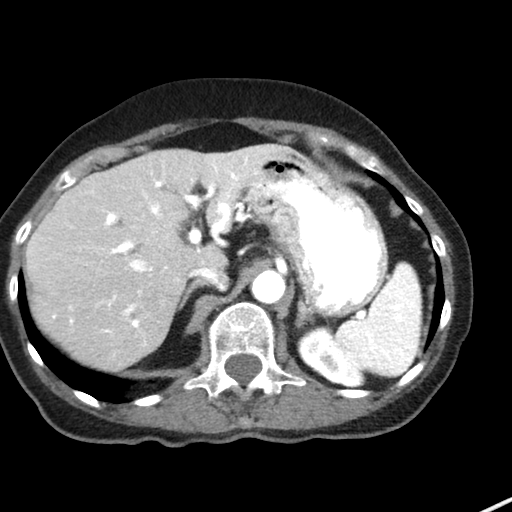
[im 71/86  soft-tissue]
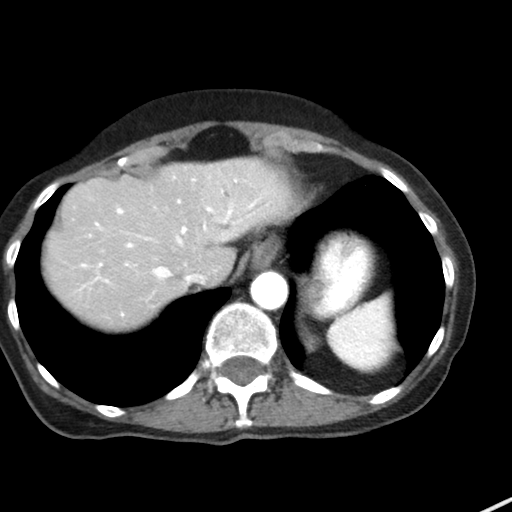
[im 81/86  soft-tissue]
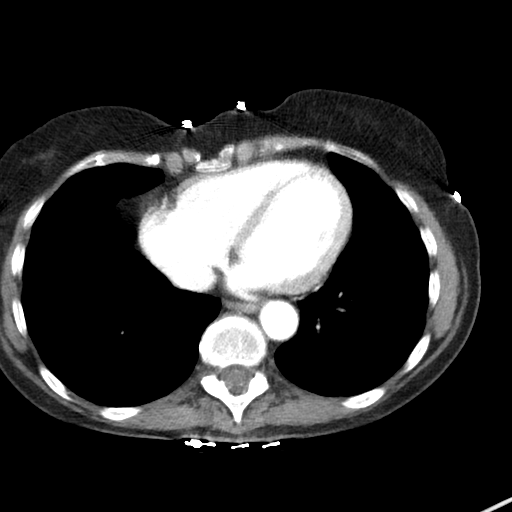

[Series 5: coronal st · coronal · 0.52mm/px · 3 of 65 slices shown]
[im 22/65  soft-tissue]
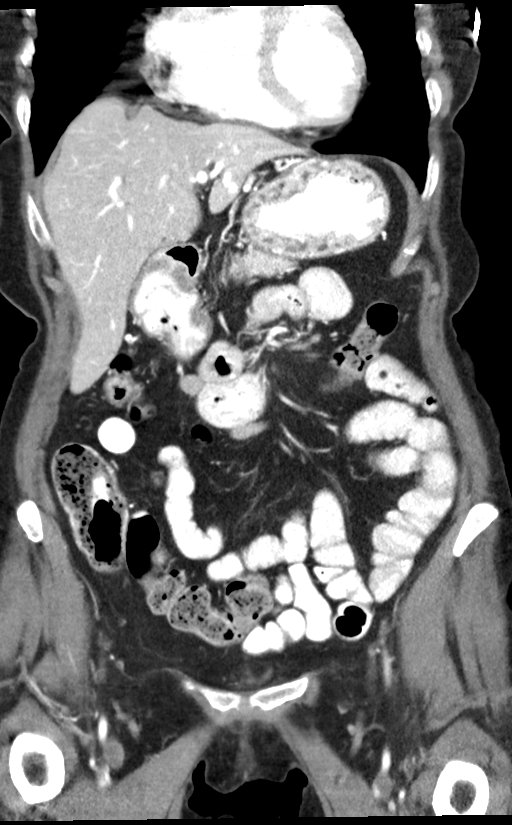
[im 29/65  soft-tissue]
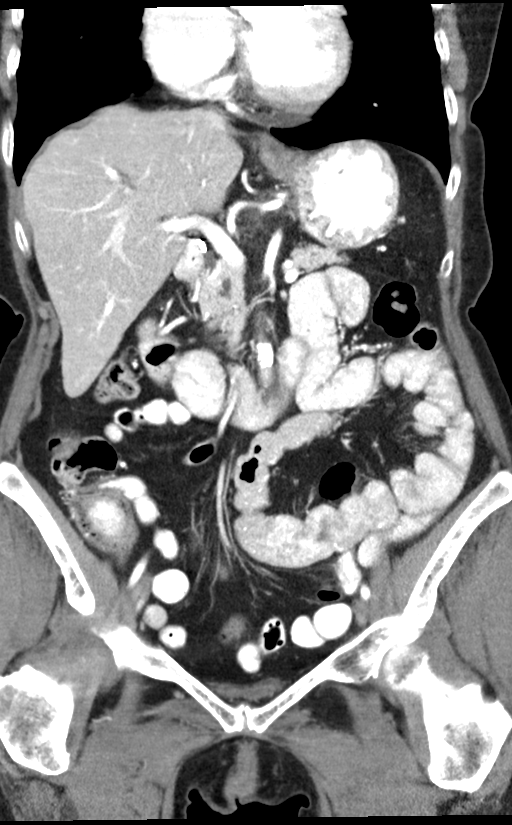
[im 36/65  soft-tissue]
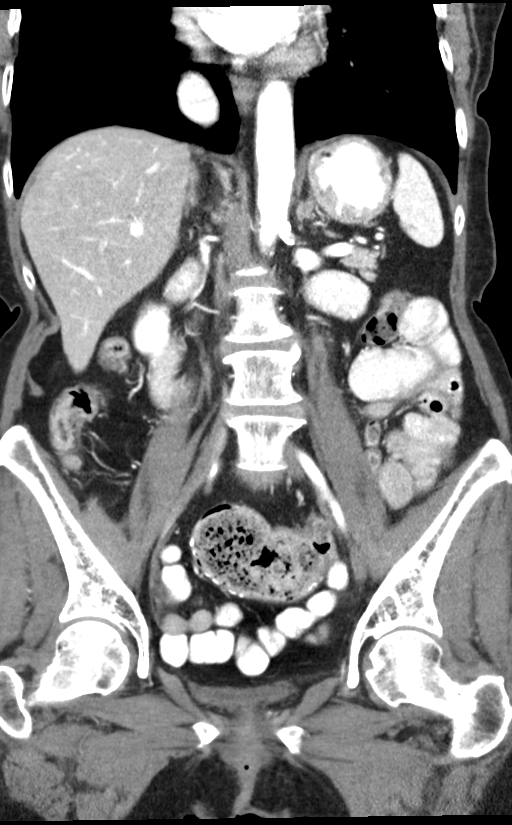

[15 of 46 positions shown; findings below may reference images not displayed]

FINDINGS: Lower chest: No acute abnormality.

Hepatobiliary: Small amount of focal fat along the falciform
ligament. No other focal liver abnormality. Status post
cholecystectomy. Unchanged mild intrahepatic biliary dilatation.

Pancreas: Unchanged mild dilatation of the proximal main pancreatic
duct. No focal mass or surrounding inflammatory changes.

Spleen: Normal in size without focal abnormality.

Adrenals/Urinary Tract: Adrenal glands are unremarkable. Kidneys are
normal, without renal calculi, focal lesion, or hydronephrosis.
Bladder is decompressed.

Stomach/Bowel: Diffuse gastric wall thickening.. No bowel wall
thickening, distention, or surrounding inflammatory changes. Prior
partial sigmoid colectomy with prominent stool at the anastomosis.
Mild sigmoid diverticulosis.

Vascular/Lymphatic: Aortic atherosclerosis. No enlarged abdominal or
pelvic lymph nodes.

Reproductive: Slight interval enlargement of the oval low-density
lesion in the right adnexa, measuring 3.6 cm, previously 2.9 cm.
Status post hysterectomy.

Other: No abdominal wall hernia or abnormality. No abdominopelvic
ascites. No pneumoperitoneum.

Musculoskeletal: No acute or significant osseous findings.
IMPRESSION: 1. Diffuse gastric wall thickening may reflect gastritis.
2. 3.6 cm right adnexal low-density lesion, only mildly increased in
size since 8988, favoring benign etiology. However, definitive
evaluation with pelvic ultrasound is still recommended. This
recommendation follows ACR consensus guidelines: White Paper of the
ACR Incidental Findings Committee II on Adnexal Findings. [HOSPITAL] [DATE].

## 2020-03-11 ENCOUNTER — Encounter: Payer: Self-pay | Admitting: Family Medicine

## 2020-03-11 NOTE — Telephone Encounter (Signed)
Nurses I am perfectly fine with him coming with his wife to discuss these issues Please notify the front desk as well And send [name] a message that it is perfectly fine to come with him  Ideally we would like to get a urine specimen when he comes thank you

## 2020-03-13 ENCOUNTER — Other Ambulatory Visit (INDEPENDENT_AMBULATORY_CARE_PROVIDER_SITE_OTHER): Payer: Medicare Other | Admitting: *Deleted

## 2020-03-13 ENCOUNTER — Other Ambulatory Visit: Payer: Self-pay

## 2020-03-13 DIAGNOSIS — Z23 Encounter for immunization: Secondary | ICD-10-CM | POA: Diagnosis not present

## 2020-03-18 ENCOUNTER — Other Ambulatory Visit: Payer: Self-pay | Admitting: Cardiology

## 2020-03-18 ENCOUNTER — Other Ambulatory Visit: Payer: Self-pay | Admitting: Family Medicine

## 2020-03-26 ENCOUNTER — Ambulatory Visit (INDEPENDENT_AMBULATORY_CARE_PROVIDER_SITE_OTHER): Payer: Medicare Other | Admitting: Family Medicine

## 2020-03-26 ENCOUNTER — Other Ambulatory Visit: Payer: Self-pay | Admitting: *Deleted

## 2020-03-26 ENCOUNTER — Other Ambulatory Visit: Payer: Self-pay

## 2020-03-26 ENCOUNTER — Encounter: Payer: Self-pay | Admitting: Family Medicine

## 2020-03-26 VITALS — BP 130/74 | HR 88 | Temp 98.0°F | Ht 62.0 in | Wt 101.0 lb

## 2020-03-26 DIAGNOSIS — Z79891 Long term (current) use of opiate analgesic: Secondary | ICD-10-CM

## 2020-03-26 DIAGNOSIS — Z5181 Encounter for therapeutic drug level monitoring: Secondary | ICD-10-CM | POA: Insufficient documentation

## 2020-03-26 DIAGNOSIS — G8929 Other chronic pain: Secondary | ICD-10-CM

## 2020-03-26 DIAGNOSIS — M5440 Lumbago with sciatica, unspecified side: Secondary | ICD-10-CM | POA: Diagnosis not present

## 2020-03-26 MED ORDER — HYDROCODONE-ACETAMINOPHEN 10-325 MG PO TABS: ORAL_TABLET | ORAL | 0 refills | Status: DC

## 2020-03-26 MED ORDER — ROSUVASTATIN CALCIUM 40 MG PO TABS: ORAL_TABLET | ORAL | 3 refills | Status: DC

## 2020-03-26 NOTE — Progress Notes (Signed)
   Subjective:    Patient ID: [name], female    DOB: [birth year], 79 y.o.   MRN: [id number]  HPI This patient was seen today for chronic pain  The medication list was reviewed and updated.   -Compliance with medication: takes 3 a day  - Number patient states they take daily: 3   -when was the last dose patient took? today  The patient was advised the importance of maintaining medication and not using illegal substances with these.  Here for refills and follow up  The patient was educated that we can provide 3 monthly scripts for their medication, it is their responsibility to follow the instructions.  Side effects or complications from medications: none  Patient is aware that pain medications are meant to minimize the severity of the pain to allow their pain levels to improve to allow for better function. They are aware of that pain medications cannot totally remove their pain.  Due for UDT ( at least once per year) : due today  Scale of 1 to 10 ( 1 is least 10 is most) Your pain level without the medicine: 8 OR 9  Your pain level with medication 2 or 3  Scale 1 to 10 ( 1-helps very little, 10 helps very well) How well does your pain medication reduce your pain so you can function better through out the day? 2 or 3    Patient relates without the pain medicine she would not be able to function she denies abusing the medicine.    Review of Systems  Constitutional: Negative for activity change and appetite change.  HENT: Negative for congestion and rhinorrhea.   Respiratory: Negative for cough and shortness of breath.   Cardiovascular: Negative for chest pain and leg swelling.  Gastrointestinal: Negative for abdominal pain, nausea and vomiting.  Skin: Negative for color change.  Neurological: Negative for dizziness and weakness.  Psychiatric/Behavioral: Negative for agitation and confusion.       Objective:   Physical Exam Vitals reviewed.  Constitutional:       General: She is not in acute distress. HENT:     Head: Normocephalic.  Cardiovascular:     Rate and Rhythm: Normal rate and regular rhythm.     Heart sounds: Normal heart sounds. No murmur heard.   Pulmonary:     Effort: Pulmonary effort is normal.     Breath sounds: Normal breath sounds.  Lymphadenopathy:     Cervical: No cervical adenopathy.  Neurological:     Mental Status: She is alert.  Psychiatric:        Behavior: Behavior normal.           Assessment & Plan:  The patient was seen in followup for chronic pain. A review over at their current pain status was discussed. Drug drug contract reviewed with her. Prescriptions were given.  Regular follow-up recommended. Discussion was held regarding the importance of compliance with medication as well as pain medication contract.  Patient was informed that medication may cause drowsiness and should not be combined  with other medications/alcohol or street drugs. If the patient feels medication is causing altered alertness then do not drive or operate dangerous equipment.  Follow-up in 3 months

## 2020-03-26 NOTE — Patient Instructions (Signed)
Narcotic medication treatment agreement-educational material and consent form. Your health problem-because you are having problems with pain you are being prescribed narcotic medication to help control your pain. The purpose of narcotic medication-narcotics are a type of drug that should help you with your pain and let you be more active in your daily life. It is not expected that your pain will go away completely. There are risks associated with these drugs and you can also have side effects. It is important for you to be honest with your doctor about your pain and the dose of medication you are taking. It is also important to be honest with your doctor about any potential problems or side effects with the medications that you are having. Risk and common problems-narcotic use has been associated with the following issues Addiction- there is a chance that you could become addicted to narcotic drugs. This means that you once the drug and will try very hard to get it, even if it causes you harm or other problems in your life. This chance is greater in people who are young, have mental illness, have been addicted to any drug in the past, or have a close relative that has been addicted to a drug in the past. Your doctor may tell you that you need tests or should see other health providers to help you avoid addiction. Allergic reaction - all kinds of allergic reactions can happen. You could have a minor reaction such as a rash or severe reaction such as swelling of your tongue or throat. A severe reaction as a medical emergency that can cause death. Incomplete relief of pain - narcotic medications do not take away all of your pain. Your doctor will work with you to try to optimize treatment but it is not reasonable to expect complete relief of pain. Low testosterone levels in men - narcotic drugs may cause the levels of the hormone testosterone to drop in men. This could change your mood and energy level. It may  also lessen the desire to have sex. Testosterone supplementation in this situation is not recommended. Physical dependence- you may not feel well if your dose is suddenly stopped. Some common symptoms of withdraw are runny nose, excessive yawning, goosebumps, nausea with stomach pains, diarrhea, body aches, and increased irritability. Side effects- there are many side effects of narcotic drugs. Constipation, nausea, vomiting, itching, dizziness are all potential side effects. Slowed breathing- excessive doses of narcotics can slow your breathing. Do not use other drugs or drink alcohol while taking narcotic drugs. This can cause accidental death. Follow directions on how the medication is prescribed. If you feel you are having problems then notify your doctor. Slowed reaction time- you may feel sleepy and be slow to react. If this happens, then you should not drive, use heavy machinery or guns, or be at unsafe heights, or be caring for someone else. Tolerance- your body could become use to the dose of narcotic drugs that your doctor tells you to take, and you may not get the same relief of pain that you had before. A higher dose may not help and could cause potential side effects. Increased risk of accidental death can occur due to the narcotic medication or side effects. If you are having any of the problems listed above you need to discuss these with your physician.  Other choices- you do not have to take narcotics. The decision to take narcotics for pain his ureters. There are other choices that you may choose.  There are several alternatives that may be helpful. These can be done in place of narcotics or in some cases along with narcotics. - Anti-inflammatories, antidepressants, and seizure medications can be helpful -Physical therapy, wearing a brace, surgical referral, home exercise regimens, could potentially help -Referral to a specialist-you may wish to see a specialist who specializes in pain  management -You can choose to do nothing and live with the pain you have -Your doctor will discuss with you your choices but the decision his ureters. How well any other treatment works will depend on your specific health problem. More facts-there may be local, steak, or federal laws that your doctor must follow with prescribing narcotic drugs. It is not clear if narcotic painkillers are good for you to take for a long period of time. You should discuss with your doctor often about the good and bad effects that these drugs may have on you.  You should not take narcotic drugs if you are pregnant. If you become pregnant notify your doctor right away. Narcotic drugs can raise a chance of having a miscarriage or having a baby born with a birth defect. Your baby can also be born addicted to the drug. Should you become pregnant you will have to discontinue narcotic use. Treatment agreement - by signing the consent form you agree that you understand the rules for taking narcotic drugs. If you do not follow these rules, then your doctor may refer you to a specialist, no longer prescribe pain medications for you, and release you/terminate you from his or her care. Drug safety-you must lock your drugs in a safe place. They must be kept away from children. We will also were review with you the right way to get rid of any extra drugs.  You may not sell, share, or let other people use your drugs. This is a crime and can cause overdoses. You may be asked to come to our office between scheduled appointments for random pill counts. Failure to comply with this will result in dismissal. Instructions for taking narcotic drugs-you are only to take pain medications that is prescribed by our office. Do not combine your pain medication with other physician narcotic pain medications or other peoples pain medications. Do not stop taking your narcotic suddenly.  Do not drive after a new pain drug is started or after a dose is  increased until you are sure it does not make you sleepy or confused. Do not try to cut or crush your drug unless told to do so. This could cause death. Your drug will be stopped if it is not helping enough or if it is showing signs of harm to you. You must tell your doctor about any new drugs or health problems. Your drug may not work well or may work differently if you have certain health problems.  You must tell your doctor about problems you have with any drugs prescribed or illegal. If you feel you're having an addiction problem with prescribed or illegal drugs you will discuss this with your doctor in order to be referred for treatment.  Your doctor reserves the right to limit other medications that can interact with pain medications. Prescriptions and refills- your narcotic drugs will be prescribed by our office only. You may not ask for pain drugs from any other doctors including emergency room doctors. Our office will decide how many refills you will be given and how often he will need to be seen in our office in order to get  them area at the very least every 3 month appointments are required. Never try to change a prescription. If you do this then it will be reported to the police. You will also be terminated from the practice. Your prescription will not be replaced if lost, stolen, or destroyed by accident. Patients on regular narcotics must keep their office visits on a regular basis-always every 3 months or less. It is at that appointment they will receive their prescriptions. Do not call for an additional month supply. An office visit is necessary. Appointments- you will keep all appointments with doctors, therapist, and counselors. If you miss your scheduled appointments often, then your doctor may slowly decrease your dose of narcotic drugs until you are no longer taking it. The dates when your prescription was filled at the pharmacy will be per 5. The state wide database will be checked at  standard visits to make sure the patient is not receiving pain prescriptions from other physicians.  Random drug testing for illegal substances will be standard. Your doctor may have you give urine, blood, hair, or saliva to run tests. If you have test results that are not normal then your doctor may slowly decrease your dose of narcotic drugs until you are no longer taking the medication or stop prescribing additional pain medications immediately. Refusal to do urine drug testing is basis for the practice no longer prescribe narcotic pain medications. Pain management specialist If your provider feels it is in your best interest to see a pain medicine specialist we will advise you have such and help you with the referral. Sometimes this referral is made because standard dosing of short acting medication is no longer keeping the patient's pain under reasonable control. Sometimes this is based upon a patient's health issue, and it is felt that pain management would best serve the patient's needs.  Once under the care of pain management we will no longer be prescribing the narcotic medications. This will be under the guidance of the pain management specialist. Further pain medication prescriptions will not be reassumed by this office once referred to pain management. In these situations we will continue to provide primary care but not pain medications.  Violations of the pain management agreement will result in our office no longer prescribing pain medications. In some situations it will also result in dismissal of the patient from our practice.  Health information-your doctor may need to discuss your treatment with pharmacists or other providers. Legal authorities may ask for your pain treatment records. If this happens then records will be given to them up on proper documentation/release.  You should also be aware that properly taking pain medications is very important in regards to operating a vehicle.  Even with following proper prescriptions instructions it is possible to be charged with operating a vehicle under influence. It is highly important that if you feel drowsy or drug that you do not operate a motor vehicle for the safety of yourself and others.

## 2020-04-06 DIAGNOSIS — Z23 Encounter for immunization: Secondary | ICD-10-CM | POA: Diagnosis not present

## 2020-04-08 NOTE — Progress Notes (Signed)
Cardiology Office Note    Date:  04/09/2020   ID:  [name], [name] [birth year], MRN [id number]  PCP:  Kathyrn Drown, MD  Cardiologist: Carlyle Dolly, MD    Chief Complaint  Patient presents with  . Follow-up    6 month visit    History of Present Illness:    [name] is a 79 y.o. female with past medical history of palpitations (monitor in 08/2018 showing PAC's, PVC's and atrial tachycardia), remote history of atrial fibrillation (on Flecainide), orthostatic hypotension, and HLD who presents to the office today for 64-month follow-up.   She was last examined by Dr. Harl Bowie in 07/2019 and reported improvement in her orthostatic symptoms since Midodrine had been increased to 5mg  TID and she had started utilizing compression stockings. No changes were made to her medication regimen at that time.   In talking with the patient today, she reports overall doing very well since her last visit. She denies any recurrent episodes of dizziness since the prior dose titration of Midodrine. She is no longer wearing compression stockings as she did not notice a change in her symptoms with this. She denies any recent chest pain or dyspnea on exertion. No recent palpitations, orthopnea, PND or lower extremity edema.  She reports good compliance with Eliquis and denies any recent melena, hematochezia or hematuria.  Past Medical History:  Diagnosis Date  . Adenocarcinoma, colon ([facility]) 03/2005   Stage I; resected in 03/2005  . Arthritis   . Colon cancer ([facility])    Removed surgicaly   . Gastroesophageal reflux disease   . Gastroparesis   . Hearing loss    mild  . Hyperlipidemia   . Irregular heartbeat   . Ovarian cyst 2008  . Paroxysmal atrial fibrillation (HCC)    Palpitations; maintained on flecainide; -normal coronary angiography and 1999; negative stress nuclear study in 11/2005  . PONV (postoperative nausea and vomiting)   . Tobacco abuse, in remission    10-pack-years; quit in  2001    Past Surgical History:  Procedure Laterality Date  . ABDOMINAL HYSTERECTOMY  1983  . BACK SURGERY    . BIOPSY  10/18/2017   Procedure: BIOPSY;  Surgeon: [name], MD;  [facility];  Service: Endoscopy;;  gastric  . BIOPSY  07/06/2018   Procedure: BIOPSY;  Surgeon: [name], MD;  [facility];  Service: Endoscopy;;  gastric  . CHOLECYSTECTOMY    . COLON SURGERY  2006   Colon cancer  . COLONOSCOPY  03/25/2012   [name], MD  . COLONOSCOPY N/A 04/26/2017   Procedure: COLONOSCOPY;  Surgeon: [name], MD;  [facility];  Service: Endoscopy;  Laterality: N/A;  2:00  . COLONOSCOPY N/A 07/06/2018   Procedure: COLONOSCOPY;  Surgeon: [name], MD;  [facility];  Service: Endoscopy;  Laterality: N/A;  10:30  . COSMETIC SURGERY  09/2007  . ESOPHAGOGASTRODUODENOSCOPY N/A 10/18/2017   Procedure: ESOPHAGOGASTRODUODENOSCOPY (EGD);  Surgeon: [name], MD;  [facility];  Service: Endoscopy;  Laterality: N/A;  . ESOPHAGOGASTRODUODENOSCOPY N/A 07/06/2018   Procedure: ESOPHAGOGASTRODUODENOSCOPY (EGD);  Surgeon: [name], MD;  [facility];  Service: Endoscopy;  Laterality: N/A;  . GIVENS CAPSULE STUDY N/A 12/26/2019   Procedure: GIVENS CAPSULE STUDY;  Surgeon: [name], MD;  [facility];  Service: Endoscopy;  Laterality: N/A;  730  . PARTIAL COLECTOMY  2006  adenocarcinoma  . POLYPECTOMY  04/26/2017   Procedure: POLYPECTOMY;  Surgeon: [name], MD;  [facility];  Service: Endoscopy;;  colon  . POLYPECTOMY  07/06/2018   Procedure: POLYPECTOMY;  Surgeon: [name], MD;  [facility];  Service: Endoscopy;;  colon   . TONSILLECTOMY      Current Medications: Outpatient Medications Prior to Visit  Medication Sig Dispense Refill  . Calcium Carbonate (CALCIUM 600 PO) Take by mouth.    Arne Cleveland 5 MG TABS tablet TAKE ONE  TABLET BY MOUTH TWICE A DAY 60 tablet 11  . Ferrous Sulfate (IRON PO) Take by mouth. 1 tab once a day    . flecainide (TAMBOCOR) 100 MG tablet TAKE 1 TABLET BY MOUTH EVERY 12 HOURS 180 tablet 3  . HYDROcodone-acetaminophen (NORCO) 10-325 MG tablet TAKE ONE TABLET BY MOUTH THREE TIMES DAILY AS NEEDED FOR PAIN 90 tablet 0  . lactulose (CHRONULAC) 10 GM/15ML solution Take 20 g by mouth at bedtime.    Marland Kitchen omeprazole (PRILOSEC) 20 MG capsule Take 20 mg by mouth daily before breakfast.     . rosuvastatin (CRESTOR) 40 MG tablet 1 qd 90 tablet 3  . vitamin B-12 (CYANOCOBALAMIN) 1000 MCG tablet Take 1,000 mcg by mouth daily.    . midodrine (PROAMATINE) 5 MG tablet Take 1 tablet (5 mg total) by mouth 3 (three) times daily with meals. 270 tablet 3  . HYDROcodone-acetaminophen (NORCO) 10-325 MG tablet TAKE ONE TABLET BY MOUTH THREE TIMES A DAY AS NEEDED FOR PAIN 90 tablet 0  . HYDROcodone-acetaminophen (NORCO) 10-325 MG tablet Take one tid prn pain 90 tablet 0   No facility-administered medications prior to visit.     Allergies:   Patient has no known allergies.   Social History   Socioeconomic History  . Marital status: Married    Spouse name: Not on file  . Number of children: Not on file  . Years of education: Not on file  . Highest education level: Not on file  Occupational History  . Occupation: Radiation protection practitioner  Tobacco Use  . Smoking status: Former Smoker    Packs/day: 0.30    Years: 40.00    Pack years: 12.00    Start date: 02/23/1963    Quit date: 03/25/2002    Years since quitting: 18.0  . Smokeless tobacco: Never Used  . Tobacco comment: Quit smoking in 2001  Vaping Use  . Vaping Use: Never used  Substance and Sexual Activity  . Alcohol use: No    Alcohol/week: 0.0 standard drinks  . Drug use: No  . Sexual activity: Yes    Birth control/protection: Surgical  Other Topics Concern  . Not on file  Social History Narrative   Lives with husband in a one story home.  Has 3 children.   Retired from Medical sales representative work.  Education: some college.   Social Determinants of Health   Financial Resource Strain:   . Difficulty of Paying Living Expenses: Not on file  Food Insecurity:   . Worried About Charity fundraiser in the Last Year: Not on file  . Ran Out of Food in the Last Year: Not on file  Transportation Needs:   . Lack of Transportation (Medical): Not on file  . Lack of Transportation (Non-Medical): Not on file  Physical Activity:   . Days of Exercise per Week: Not on file  . Minutes of Exercise per Session: Not on file  Stress:   . Feeling of Stress :  Not on file  Social Connections:   . Frequency of Communication with Friends and Family: Not on file  . Frequency of Social Gatherings with Friends and Family: Not on file  . Attends Religious Services: Not on file  . Active Member of Clubs or Organizations: Not on file  . Attends Archivist Meetings: Not on file  . Marital Status: Not on file     Family History:  The patient's family history includes Cancer in her brother and father; Healthy in her son; Heart attack in her mother; Heart disease in her father and mother; Heart failure in her sister; Hypertension in her father; Stroke in her mother and sister.   Review of Systems:   Please see the history of present illness.     General:  No chills, fever, night sweats or weight changes.  Cardiovascular:  No chest pain, dyspnea on exertion, edema, orthopnea, palpitations, paroxysmal nocturnal dyspnea. Dermatological: No rash, lesions/masses Respiratory: No cough, dyspnea Urologic: No hematuria, dysuria Abdominal:   No nausea, vomiting, diarrhea, bright red blood per rectum, melena, or hematemesis Neurologic:  No visual changes, wkns, changes in mental status. All other systems reviewed and are otherwise negative except as noted above.   Physical Exam:    VS:  BP 118/64   Pulse 73   Ht 5\' 2"  (1.575 m)   Wt 102 lb 6.4 oz (46.4 kg)   SpO2 96%   BMI  18.73 kg/m    General: Well developed, well nourished,female appearing in no acute distress. Head: Normocephalic, atraumatic. Neck: No carotid bruits. JVD not elevated.  Lungs: Respirations regular and unlabored, without wheezes or rales.  Heart: Regular rate and rhythm. No S3 or S4.  No murmur, no rubs, or gallops appreciated. Abdomen: Appears non-distended. No obvious abdominal masses. Msk:  Strength and tone appear normal for age. No obvious joint deformities or effusions. Extremities: No clubbing or cyanosis. No lower extremity edema.  Distal pedal pulses are 2+ bilaterally. Neuro: Alert and oriented X 3. Moves all extremities spontaneously. No focal deficits noted. Psych:  Responds to questions appropriately with a normal affect. Skin: No rashes or lesions noted  Wt Readings from Last 3 Encounters:  04/09/20 102 lb 6.4 oz (46.4 kg)  03/26/20 101 lb (45.8 kg)  12/28/19 102 lb 3.2 oz (46.4 kg)        Studies/Labs Reviewed:   EKG:  EKG is ordered today.  The ekg ordered today demonstrates NSR, HR 73 with known RBBB.   Recent Labs: 12/07/2019: Hemoglobin 11.9; Platelets 290 12/18/2019: ALT 17; BUN 21; Creatinine, Ser 0.93; Potassium 3.7; Sodium 146   Lipid Panel    Component Value Date/Time   CHOL 168 12/18/2019 1337   TRIG 174 (H) 12/18/2019 1337   HDL 61 12/18/2019 1337   CHOLHDL 2.8 12/18/2019 1337   CHOLHDL 3.0 03/19/2014 0712   VLDL 36 03/19/2014 0712   LDLCALC 78 12/18/2019 1337    Additional studies/ records that were reviewed today include:   NST: 08/2017  There was no ST segment deviation noted during stress.  The study is normal. There are no perfusion defects consistent with prior infarct or current ischemia.  This is a low risk study.  The left ventricular ejection fraction is normal (55-65%).  Holter Monitor: 08/2018  48 hr holter monitor  Min HR 50, Max HR 103, Avg HR 68  Rare ventricular ectopy, 2 isolated PVCs  Rare supraventricular ectopy.  Primarily PACs. Three short runs of atach, longest 4 beats.  Reported symptoms correlated with sinus rhythm   Assessment:    1. Paroxysmal atrial fibrillation (HCC)   2. Palpitations   3. Orthostatic hypotension   4. Mixed hyperlipidemia      Plan:   In order of problems listed above:  1. Paroxysmal Atrial Fibrillation/Palpitations - She has a history of remote atrial fibrillation and most recent monitor in 08/2018 showed PAC's, PVC's and atrial tachycardia as outlined above. She denies any recent symptoms and heart rate is well controlled in the 70's during today's visit. Continue Flecainide 100 mg twice daily. - She denies any evidence of active bleeding. Remains on Eliquis 5 mg twice daily for anticoagulation. Labs in 11/2019 showed hemoglobin was stable at 11.9 and platelets were at 290K.  2. Orthostatic Hypotension - She reports her symptoms have significantly improved since being started on Midodrine and denies any recurrent dizziness or presyncope. Says her SBP is typically in the 110's to 120's when checked at home. I recommended that she continue with Midodrine 5 mg TID for now. The importance of adequate hydration was again reviewed.  3. HLD - Followed by PCP. FLP in 12/2019 showed total cholesterol 168, triglycerides 174, HDL 61 and LDL 78. She remains on Crestor 40 mg daily.   Medication Adjustments/Labs and Tests Ordered: Current medicines are reviewed at length with the patient today.  Concerns regarding medicines are outlined above.  Medication changes, Labs and Tests ordered today are listed in the Patient Instructions below. Patient Instructions  Medication Instructions:  Your physician recommends that you continue on your current medications as directed. Please refer to the Current Medication list given to you today.  *If you need a refill on your cardiac medications before your next appointment, please call your pharmacy*   Lab Work: NONE   If you have labs  (blood work) drawn today and your tests are completely normal, you will receive your results only by: Marland Kitchen MyChart Message (if you have MyChart) OR . A paper copy in the mail If you have any lab test that is abnormal or we need to change your treatment, we will call you to review the results.   Testing/Procedures: NONE   Follow-Up: At [facility], you and your health needs are our priority.  As part of our continuing mission to provide you with exceptional heart care, we have created designated Provider Care Teams.  These Care Teams include your primary Cardiologist (physician) and Advanced Practice Providers (APPs -  Physician Assistants and Nurse Practitioners) who all work together to provide you with the care you need, when you need it.  We recommend signing up for the patient portal called "MyChart".  Sign up information is provided on this After Visit Summary.  MyChart is used to connect with patients for Virtual Visits (Telemedicine).  Patients are able to view lab/test results, encounter notes, upcoming appointments, etc.  Non-urgent messages can be sent to your provider as well.   To learn more about what you can do with MyChart, go to [email or web address].    Your next appointment:   6 month(s)  The format for your next appointment:   In Person  Provider:   Carlyle Dolly, MD   Other Instructions Thank you for choosing [facility]!    Signed, Erma Heritage, PA-C  04/09/2020 5:18 PM    [facility] [address] [place], [place] [address] Phone: [phone] Fax: [phone]

## 2020-04-09 ENCOUNTER — Ambulatory Visit (INDEPENDENT_AMBULATORY_CARE_PROVIDER_SITE_OTHER): Payer: Medicare Other | Admitting: Student

## 2020-04-09 ENCOUNTER — Encounter: Payer: Self-pay | Admitting: Student

## 2020-04-09 ENCOUNTER — Other Ambulatory Visit: Payer: Self-pay

## 2020-04-09 VITALS — BP 118/64 | HR 73 | Ht 62.0 in | Wt 102.4 lb

## 2020-04-09 DIAGNOSIS — I951 Orthostatic hypotension: Secondary | ICD-10-CM | POA: Diagnosis not present

## 2020-04-09 DIAGNOSIS — R002 Palpitations: Secondary | ICD-10-CM

## 2020-04-09 DIAGNOSIS — I480 Paroxysmal atrial fibrillation: Secondary | ICD-10-CM | POA: Diagnosis not present

## 2020-04-09 DIAGNOSIS — E782 Mixed hyperlipidemia: Secondary | ICD-10-CM | POA: Diagnosis not present

## 2020-04-09 MED ORDER — MIDODRINE HCL 5 MG PO TABS
5.0000 mg | ORAL_TABLET | Freq: Three times a day (TID) | ORAL | 3 refills | Status: DC
Start: 2020-04-09 — End: 2021-03-07

## 2020-04-09 NOTE — Patient Instructions (Signed)
Medication Instructions:  Your physician recommends that you continue on your current medications as directed. Please refer to the Current Medication list given to you today.  *If you need a refill on your cardiac medications before your next appointment, please call your pharmacy*   Lab Work: NONE   If you have labs (blood work) drawn today and your tests are completely normal, you will receive your results only by: . MyChart Message (if you have MyChart) OR . A paper copy in the mail If you have any lab test that is abnormal or we need to change your treatment, we will call you to review the results.   Testing/Procedures: NONE    Follow-Up: At [facility], you and your health needs are our priority.  As part of our continuing mission to provide you with exceptional heart care, we have created designated Provider Care Teams.  These Care Teams include your primary Cardiologist (physician) and Advanced Practice Providers (APPs -  Physician Assistants and Nurse Practitioners) who all work together to provide you with the care you need, when you need it.  We recommend signing up for the patient portal called "MyChart".  Sign up information is provided on this After Visit Summary.  MyChart is used to connect with patients for Virtual Visits (Telemedicine).  Patients are able to view lab/test results, encounter notes, upcoming appointments, etc.  Non-urgent messages can be sent to your provider as well.   To learn more about what you can do with MyChart, go to [email or web address].    Your next appointment:   6 month(s)  The format for your next appointment:   In Person  Provider:   Jonathan Branch, MD   Other Instructions Thank you for choosing [facility]!    

## 2020-04-18 ENCOUNTER — Other Ambulatory Visit: Payer: Self-pay

## 2020-04-18 ENCOUNTER — Telehealth: Payer: Self-pay | Admitting: Family Medicine

## 2020-04-18 ENCOUNTER — Encounter (HOSPITAL_COMMUNITY): Payer: Self-pay | Admitting: Emergency Medicine

## 2020-04-18 ENCOUNTER — Emergency Department (HOSPITAL_COMMUNITY): Payer: Medicare Other

## 2020-04-18 ENCOUNTER — Emergency Department (HOSPITAL_COMMUNITY)
Admission: EM | Admit: 2020-04-18 | Discharge: 2020-04-18 | Disposition: A | Discharge status: Home / Self Care | Payer: Medicare Other | Attending: Emergency Medicine | Admitting: Emergency Medicine

## 2020-04-18 DIAGNOSIS — Z7901 Long term (current) use of anticoagulants: Secondary | ICD-10-CM | POA: Insufficient documentation

## 2020-04-18 DIAGNOSIS — R202 Paresthesia of skin: Secondary | ICD-10-CM | POA: Diagnosis not present

## 2020-04-18 DIAGNOSIS — Z85038 Personal history of other malignant neoplasm of large intestine: Secondary | ICD-10-CM | POA: Diagnosis not present

## 2020-04-18 DIAGNOSIS — M25551 Pain in right hip: Secondary | ICD-10-CM | POA: Diagnosis not present

## 2020-04-18 DIAGNOSIS — R5381 Other malaise: Secondary | ICD-10-CM | POA: Diagnosis not present

## 2020-04-18 DIAGNOSIS — M542 Cervicalgia: Secondary | ICD-10-CM | POA: Diagnosis not present

## 2020-04-18 DIAGNOSIS — M545 Low back pain, unspecified: Secondary | ICD-10-CM | POA: Diagnosis not present

## 2020-04-18 DIAGNOSIS — Z87891 Personal history of nicotine dependence: Secondary | ICD-10-CM | POA: Diagnosis not present

## 2020-04-18 DIAGNOSIS — M25519 Pain in unspecified shoulder: Secondary | ICD-10-CM

## 2020-04-18 DIAGNOSIS — I959 Hypotension, unspecified: Secondary | ICD-10-CM | POA: Diagnosis not present

## 2020-04-18 DIAGNOSIS — Z79899 Other long term (current) drug therapy: Secondary | ICD-10-CM | POA: Insufficient documentation

## 2020-04-18 DIAGNOSIS — R52 Pain, unspecified: Secondary | ICD-10-CM | POA: Diagnosis not present

## 2020-04-18 DIAGNOSIS — M5459 Other low back pain: Secondary | ICD-10-CM | POA: Diagnosis not present

## 2020-04-18 DIAGNOSIS — M549 Dorsalgia, unspecified: Secondary | ICD-10-CM | POA: Diagnosis not present

## 2020-04-18 LAB — URINALYSIS, ROUTINE W REFLEX MICROSCOPIC
Bilirubin Urine: NEGATIVE
Glucose, UA: NEGATIVE mg/dL
Hgb urine dipstick: NEGATIVE
Ketones, ur: 5 mg/dL — AB
Nitrite: NEGATIVE
Protein, ur: 30 mg/dL — AB
Specific Gravity, Urine: 1.032 — ABNORMAL HIGH (ref 1.005–1.030)
pH: 5 (ref 5.0–8.0)

## 2020-04-18 LAB — CBC WITH DIFFERENTIAL/PLATELET
Abs Immature Granulocytes: 0.07 10*3/uL (ref 0.00–0.07)
Basophils Absolute: 0 10*3/uL (ref 0.0–0.1)
Basophils Relative: 0 %
Eosinophils Absolute: 0 10*3/uL (ref 0.0–0.5)
Eosinophils Relative: 0 %
HCT: 34.1 % — ABNORMAL LOW (ref 36.0–46.0)
Hemoglobin: 10.9 g/dL — ABNORMAL LOW (ref 12.0–15.0)
Immature Granulocytes: 1 %
Lymphocytes Relative: 14 %
Lymphs Abs: 1.8 10*3/uL (ref 0.7–4.0)
MCH: 33.3 pg (ref 26.0–34.0)
MCHC: 32 g/dL (ref 30.0–36.0)
MCV: 104.3 fL — ABNORMAL HIGH (ref 80.0–100.0)
Monocytes Absolute: 1.1 10*3/uL — ABNORMAL HIGH (ref 0.1–1.0)
Monocytes Relative: 8 %
Neutro Abs: 10.5 10*3/uL — ABNORMAL HIGH (ref 1.7–7.7)
Neutrophils Relative %: 77 %
Platelets: 251 10*3/uL (ref 150–400)
RBC: 3.27 MIL/uL — ABNORMAL LOW (ref 3.87–5.11)
RDW: 11.8 % (ref 11.5–15.5)
WBC: 13.6 10*3/uL — ABNORMAL HIGH (ref 4.0–10.5)
nRBC: 0 % (ref 0.0–0.2)

## 2020-04-18 LAB — COMPREHENSIVE METABOLIC PANEL
ALT: 46 U/L — ABNORMAL HIGH (ref 0–44)
AST: 54 U/L — ABNORMAL HIGH (ref 15–41)
Albumin: 3.9 g/dL (ref 3.5–5.0)
Alkaline Phosphatase: 65 U/L (ref 38–126)
Anion gap: 9 (ref 5–15)
BUN: 24 mg/dL — ABNORMAL HIGH (ref 8–23)
CO2: 23 mmol/L (ref 22–32)
Calcium: 9.2 mg/dL (ref 8.9–10.3)
Chloride: 104 mmol/L (ref 98–111)
Creatinine, Ser: 1.21 mg/dL — ABNORMAL HIGH (ref 0.44–1.00)
GFR, Estimated: 46 mL/min — ABNORMAL LOW (ref >60)
Glucose, Bld: 123 mg/dL — ABNORMAL HIGH (ref 70–99)
Potassium: 4 mmol/L (ref 3.5–5.1)
Sodium: 136 mmol/L (ref 135–145)
Total Bilirubin: 0.8 mg/dL (ref 0.3–1.2)
Total Protein: 7.1 g/dL (ref 6.5–8.1)

## 2020-04-18 LAB — LACTIC ACID, PLASMA: Lactic Acid, Venous: 1.1 mmol/L (ref 0.5–1.9)

## 2020-04-18 LAB — TROPONIN I (HIGH SENSITIVITY): Troponin I (High Sensitivity): 3 ng/L (ref <18)

## 2020-04-18 MED ORDER — HYDROMORPHONE HCL 1 MG/ML IJ SOLN
1.0000 mg | Freq: Once | INTRAMUSCULAR | Status: AC
  Administered 2020-04-18: 1 mg via INTRAVENOUS
  Filled 2020-04-18: qty 1

## 2020-04-18 MED ORDER — METHOCARBAMOL 500 MG PO TABS: 500.0000 mg | ORAL_TABLET | Freq: Three times a day (TID) | ORAL | 0 refills | Status: DC | PRN

## 2020-04-18 MED ORDER — ONDANSETRON HCL 4 MG/2ML IJ SOLN
4.0000 mg | Freq: Once | INTRAMUSCULAR | Status: AC
  Administered 2020-04-18: 4 mg via INTRAVENOUS
  Filled 2020-04-18: qty 2

## 2020-04-18 NOTE — Telephone Encounter (Signed)
FYI Pt husband calling in to make [place] aware that pt was taking to [facility] via EMS. Husband states that "you can not touch her".  Informed husband that we would more than likely want to do a follow up after she is discharged.
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
## 2020-04-18 NOTE — ED Provider Notes (Signed)
[facility] EMERGENCY DEPARTMENT Provider Note   CSN: [id number] Arrival date & time: 04/18/20  1047     History Chief Complaint  Patient presents with   Back Pain    [name] is a 79 y.o. female.  Patient is complaining of severe pain in her lumbar spine with pain in her right hip.  She also complains of pain in her neck with numbness in her arms.  Been going on for a few days.  The history is provided by the patient and medical records. No language interpreter was used.  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   Stage I; resected in 03/2005   Arthritis    Colon cancer ([place])    Removed surgicaly    Gastroesophageal reflux disease    Gastroparesis    Hearing loss    mild   Hyperlipidemia    Irregular heartbeat    Ovarian cyst 2008   Paroxysmal atrial fibrillation (HCC)    Palpitations; maintained on flecainide; -normal coronary angiography and 1999; negative stress nuclear study in 11/2005   PONV (postoperative nausea and vomiting)    Tobacco abuse, in remission    10-pack-years; quit in 2001    Patient Active Problem List   Diagnosis Date Noted   Encounter for monitoring opioid maintenance therapy 03/26/2020   Loss of weight 01/23/2019   Adnexal mass 01/13/2019   Chronic pain syndrome 11/08/2018   Abnormal CT of the abdomen 06/23/2018   Gastric wall thickening 06/23/2018   History of colonic polyps 04/27/2018   History of colon cancer 04/27/2018   Family hx of colon cancer 04/27/2018   Early satiety 09/20/2017   Malignant neoplasm of colon ([place]) 01/21/2017   Sciatica  of right side 03/06/2015   Knee pain, left 03/13/2014   Pain in joint, ankle and foot 03/13/2014   Muscle weakness (generalized) 01/10/2014   Difficulty in walking(719.7) 01/10/2014   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   Sciatica of left side 04/26/2013   Chest pain at rest 03/21/2013   Prediabetes 12/30/2012   Paroxysmal atrial fibrillation ([place])    ADENOCARCINOMA, COLON 06/24/2009   GASTROESOPHAGEAL REFLUX DISEASE 06/24/2009   Hyperlipidemia 06/12/2009    Past Surgical History:  Procedure Laterality Date   ABDOMINAL HYSTERECTOMY  1983   BACK SURGERY     BIOPSY  10/18/2017   Procedure: BIOPSY;  Surgeon: [name], MD;  [facility]: [facility];  Service: Endoscopy;;  gastric   BIOPSY  07/06/2018   Procedure: BIOPSY;  Surgeon: [name], MD;  [facility]: [facility];  Service: Endoscopy;;  gastric   CHOLECYSTECTOMY     COLON SURGERY  2006   Colon cancer   COLONOSCOPY  03/25/2012   [name], MD   COLONOSCOPY N/A 04/26/2017   Procedure: COLONOSCOPY;  Surgeon: [name], MD;  Location: [facility];  Service: Endoscopy;  Laterality: N/A;  2:00   COLONOSCOPY N/A 07/06/2018   Procedure: COLONOSCOPY;  Surgeon: [name], MD;  [facility];  Service: Endoscopy;  Laterality: N/A;  10:30   COSMETIC SURGERY  09/2007   ESOPHAGOGASTRODUODENOSCOPY N/A 10/18/2017   Procedure: ESOPHAGOGASTRODUODENOSCOPY (EGD);  Surgeon: [name], MD;  [facility];  Service: Endoscopy;  Laterality: N/A;   ESOPHAGOGASTRODUODENOSCOPY N/A 07/06/2018   Procedure: ESOPHAGOGASTRODUODENOSCOPY (EGD);  Surgeon: [name], MD;  [facility];  Service: Endoscopy;  Laterality: N/A;   GIVENS CAPSULE STUDY N/A 12/26/2019   Procedure: GIVENS CAPSULE STUDY;  Surgeon: [name], MD;  [facility];  Service: Endoscopy;  Laterality: N/A;  730   PARTIAL COLECTOMY  2006   adenocarcinoma    POLYPECTOMY  04/26/2017   Procedure: POLYPECTOMY;  Surgeon: [name], MD;  [facility];  Service: Endoscopy;;  colon   POLYPECTOMY  07/06/2018   Procedure: POLYPECTOMY;  Surgeon: [name], MD;  [facility];  Service: Endoscopy;;  colon    TONSILLECTOMY       OB History    Gravida  1   Para  1   Term      Preterm      AB      Living        SAB      IAB      Ectopic      Multiple      Live Births              Family History  Problem Relation Age of Onset   Heart disease Mother    Stroke Mother    Heart attack Mother    Cancer Father        lung   Heart disease Father    Hypertension Father    Cancer Brother        rectal   Healthy Son    Heart failure Sister    Stroke Sister     Social History   Tobacco Use   Smoking status: Former Smoker    Packs/day: 0.30    Years: 40.00    Pack years: 12.00    Start date: 02/23/1963    Quit date: 03/25/2002    Years since quitting: 18.0   Smokeless tobacco: Never Used   Tobacco comment: Quit smoking in 2001  Vaping Use   Vaping Use: Never used  Substance Use Topics   Alcohol use: No    Alcohol/week: 0.0 standard drinks   Drug use: No    Home Medications Prior to Admission medications   Medication Sig Start Date End Date Taking? Authorizing Provider  Calcium Carbonate (CALCIUM 600 PO) Take by mouth.    [provider]  ELIQUIS 5 MG TABS tablet TAKE ONE TABLET BY MOUTH TWICE A DAY 03/18/20   Arnoldo Lenis, MD  Ferrous Sulfate (IRON PO) Take by mouth. 1 tab once a day    [provider]  flecainide (TAMBOCOR) 100 MG tablet TAKE 1 TABLET BY MOUTH EVERY 12 HOURS 10/16/19   Branch, Alphonse Guild, MD  HYDROcodone-acetaminophen (NORCO) 10-325 MG tablet TAKE ONE TABLET BY MOUTH THREE TIMES DAILY AS NEEDED FOR PAIN 03/26/20   Kathyrn Drown, MD  HYDROcodone-acetaminophen (NORCO) 10-325 MG tablet TAKE ONE TABLET BY MOUTH THREE TIMES A  DAY AS NEEDED FOR PAIN 03/26/20   Kathyrn Drown, MD  HYDROcodone-acetaminophen [facility]) 10-325 MG tablet Take one tid prn pain 03/26/20   Kathyrn Drown, MD  lactulose (CHRONULAC) 10 GM/15ML solution Take 20 g by mouth at bedtime.    [provider]  midodrine (PROAMATINE) 5 MG tablet Take 1  tablet (5 mg total) by mouth 3 (three) times daily with meals. 04/09/20   Strader, Fransisco Hertz, PA-C  omeprazole (PRILOSEC) 20 MG capsule Take 20 mg by mouth daily before breakfast.     [provider]  rosuvastatin (CRESTOR) 40 MG tablet 1 qd 03/26/20   Kathyrn Drown, MD  vitamin B-12 (CYANOCOBALAMIN) 1000 MCG tablet Take 1,000 mcg by mouth daily.    [provider]    Allergies    Patient has no known allergies.  Review of Systems   Review of Systems  Constitutional: Negative for appetite change and fatigue.  HENT: Negative for congestion, ear discharge and sinus pressure.   Eyes: Negative for discharge.  Respiratory: Negative for cough.   Cardiovascular: Negative for chest pain.  Gastrointestinal: Negative for abdominal pain and diarrhea.  Genitourinary: Negative for frequency and hematuria.  Musculoskeletal: Positive for back pain and neck pain.  Skin: Negative for rash.  Neurological: Negative for seizures and headaches.  Psychiatric/Behavioral: Negative for hallucinations.    Physical Exam Updated Vital Signs BP (!) 101/52 (BP Location: Right Arm)    Pulse 76    Temp (!) 97.1 F (36.2 C) (Tympanic)    Resp 18    Ht 5\' 2"  (1.575 m)    Wt 46.3 kg    SpO2 98%    BMI 18.66 kg/m   Physical Exam Vitals and nursing note reviewed.  Constitutional:      Appearance: She is well-developed.  HENT:     Head: Normocephalic.     Nose: Nose normal.  Eyes:     General: No scleral icterus.    Extraocular Movements: EOM normal.     Conjunctiva/sclera: Conjunctivae normal.  Neck:     Thyroid: No thyromegaly.  Cardiovascular:     Rate and Rhythm: Normal rate and  regular rhythm.     Heart sounds: No murmur heard. No friction rub. No gallop.   Pulmonary:     Breath sounds: No stridor. No wheezing or rales.  Chest:     Chest wall: No tenderness.  Abdominal:     General: There is no distension.     Tenderness: There is no abdominal tenderness. There is no rebound.  Musculoskeletal:        General: No edema. Normal range of motion.     Cervical back: Neck supple.     Comments: Tenderness to lumbar spine right side.  Tenderness bilateral neck  Lymphadenopathy:     Cervical: No cervical adenopathy.  Skin:    Findings: No erythema or rash.  Neurological:     Mental Status: She is alert and oriented to person, place, and time.     Motor: No abnormal muscle tone.     Coordination: Coordination normal.  Psychiatric:        Mood and Affect: Mood and affect normal.        Behavior: Behavior normal.     ED Results / Procedures / Treatments   Labs (all labs ordered are listed, but only abnormal results are displayed) Labs Reviewed  CBC WITH DIFFERENTIAL/PLATELET  COMPREHENSIVE METABOLIC PANEL  LACTIC ACID, PLASMA  URINALYSIS, ROUTINE W REFLEX MICROSCOPIC  TROPONIN I (HIGH SENSITIVITY)    EKG None  Radiology No results found.  Procedures Procedures (including critical care time)  Medications Ordered in ED Medications  HYDROmorphone (DILAUDID) injection 1 mg (1 mg Intravenous Given 04/18/20 1418)  ondansetron (ZOFRAN) injection 4 mg (4 mg Intravenous Given 04/18/20 1417)    ED Course  I have reviewed the triage vital signs and the nursing notes.  Pertinent labs & imaging results that were available during my care of the patient were reviewed by me and considered in my medical decision making (see chart for details).    MDM Rules/Calculators/A&P                          Patient with neck pain back pain and numbness in her hands.  Labs and MRI pending.  She will be dispositioned by my colleague Dr. Alvino Chapel Final Clinical  Impression(s) / ED Diagnoses Final diagnoses:  None    Rx / DC Orders ED Discharge Orders    None       Milton Ferguson, MD 04/19/20 1101

## 2020-04-18 NOTE — ED Provider Notes (Signed)
  Physical Exam  BP (!) 121/54 (BP Location: Right Arm)   Pulse 68   Temp 98.1 F (36.7 C) (Oral)   Resp 18   Ht 5\' 2"  (1.575 m)   Wt 46.3 kg   SpO2 100%   BMI 18.66 kg/m   Physical Exam  ED Course/Procedures     Procedures  MDM  Received patient in signout.  Low back pain and neck pain with paresthesias in both hands and weakness in legs.  MRI imaging done and reassuring.  Already on pain medicines.  Will add muscle relaxers even with the risk complications with her age.  Follow-up with Dr. Wolfgang Phoenix as needed.       Davonna Belling, MD 04/18/20 2314

## 2020-04-18 NOTE — Discharge Instructions (Addendum)
Follow-up with Dr. Wolfgang Phoenix for further management of the pain in your neck and back.  Take care with the medicine as it can make you dizzy or confused.

## 2020-04-18 NOTE — ED Triage Notes (Signed)
Pt reports having chronic back pain.

## 2020-04-18 NOTE — ED Notes (Signed)
Patient transported to MRI 

## 2020-04-19 ENCOUNTER — Telehealth: Payer: Self-pay

## 2020-04-20 LAB — URINE CULTURE: Culture: NO GROWTH

## 2020-04-20 NOTE — Telephone Encounter (Signed)
I was able to review over the ER chart

## 2020-04-22 ENCOUNTER — Telehealth: Payer: Self-pay

## 2020-04-23 ENCOUNTER — Telehealth: Payer: Self-pay | Admitting: *Deleted

## 2020-04-23 NOTE — Telephone Encounter (Signed)
   CM     1        Lisa Barre "[name]" Female, 79 y.o., Jun 03, 1940  MRN:  [id number] Phone:  [phone] (H)       PCP:  Kathyrn Drown, MD Primary Cvg:  Medicare/Medicare Part A And B  Next Appt With Family Medicine Sallee Lange, MD) 04/24/2020 at 2:00 PM             To Close This Visit  Required Items  No additional encounter notes found.      Advice Only (Patient went to ER last night with back/neck pain. They did MRI without contrast and didn't find anything but patient is still in pain . Please advise)  You routed conversation to [facility] 49 minutes ago (2:37 PM)   [name], [name]" [phone]  You 4 days ago       Questionnaires  No completed forms available for this encounter.

## 2020-04-23 NOTE — Telephone Encounter (Signed)
   CM     1        [name] "[name]" Female, 79 y.o., [birth year]  MRN:  [id number] Phone:  [phone] (H)       PCP:  Kathyrn Drown, MD Primary Cvg:  Medicare/Medicare Part A And B  Next Appt With Family Medicine Sallee Lange, MD) 04/24/2020 at 2:00 PM             To Close This Visit  Required Items  No additional encounter notes found.      Advice Only (Patient left message this morning requesting a phone visit from [facility] follow up due to back pain. Please advise where to put her on the schedule)  Sharilyn, [facility] "[name]" [phone]  You Yesterday (10:47 AM)       Questionnaires  No completed forms available for this encounter.

## 2020-04-24 ENCOUNTER — Other Ambulatory Visit: Payer: Self-pay

## 2020-04-24 ENCOUNTER — Encounter: Payer: Self-pay | Admitting: Family Medicine

## 2020-04-24 ENCOUNTER — Ambulatory Visit (INDEPENDENT_AMBULATORY_CARE_PROVIDER_SITE_OTHER): Payer: Medicare Other | Admitting: Family Medicine

## 2020-04-24 VITALS — BP 110/64 | HR 82 | Temp 97.9°F | Ht 62.0 in

## 2020-04-24 DIAGNOSIS — M791 Myalgia, unspecified site: Secondary | ICD-10-CM

## 2020-04-24 DIAGNOSIS — D72829 Elevated white blood cell count, unspecified: Secondary | ICD-10-CM

## 2020-04-24 DIAGNOSIS — M549 Dorsalgia, unspecified: Secondary | ICD-10-CM

## 2020-04-24 DIAGNOSIS — M2550 Pain in unspecified joint: Secondary | ICD-10-CM | POA: Diagnosis not present

## 2020-04-24 DIAGNOSIS — D539 Nutritional anemia, unspecified: Secondary | ICD-10-CM

## 2020-04-24 MED ORDER — PREDNISONE 10 MG PO TABS: ORAL_TABLET | ORAL | 0 refills | Status: DC

## 2020-04-24 NOTE — Progress Notes (Signed)
   Subjective:    Patient ID: [name], female    DOB: 1941-04-12, 79 y.o.   MRN: [id number]  [facility] up ED visit for pain. Having pain in back, shoulders, hands and right leg. Taking hydrocodone 10/325 and robaxin. Helps some. Having a lot of pain and trouble walking. States the pain has gotten some better.     Review of Systems  Constitutional: Negative for activity change, appetite change and fatigue.  HENT: Negative for congestion and rhinorrhea.   Respiratory: Negative for cough and shortness of breath.   Cardiovascular: Negative for chest pain and leg swelling.  Gastrointestinal: Negative for abdominal pain and diarrhea.  Endocrine: Negative for polydipsia and polyphagia.  Musculoskeletal: Positive for arthralgias and back pain.  Skin: Negative for color change.  Neurological: Negative for dizziness and weakness.  Psychiatric/Behavioral: Negative for behavioral problems and confusion.       Objective:   Physical Exam Vitals reviewed.  Constitutional:      General: She is not in acute distress.    Appearance: She is well-nourished.  HENT:     Head: Normocephalic.  Cardiovascular:     Rate and Rhythm: Normal rate and regular rhythm.     Heart sounds: Normal heart sounds. No murmur heard.   Pulmonary:     Effort: Pulmonary effort is normal.     Breath sounds: Normal breath sounds.  Musculoskeletal:        General: No edema.  Lymphadenopathy:     Cervical: No cervical adenopathy.  Neurological:     Mental Status: She is alert.  Psychiatric:        Behavior: Behavior normal.   Subjective discomfort in her hands wrist elbows as well as shoulders        Assessment & Plan:  1. Arthralgia, unspecified joint Concerning for the possibility of rheumatoid arthritis check lab work await the results may use Tylenol as needed - ANA - CBC - Sedimentation rate - Rheumatoid factor - C-reactive protein - CK (Creatine Kinase)  2. Myalgia I doubt that this is a  reaction to her medications but we will check a CK - ANA - CBC - Sedimentation rate - Rheumatoid factor - C-reactive protein - CK (Creatine Kinase)  3. Leukocytosis, unspecified type She had leukocytosis in the ER we will recheck a CBC to see where this is having no fever sweats or chills currently I doubt discitis  - ANA - CBC - Sedimentation rate - Rheumatoid factor - C-reactive protein - CK (Creatine Kinase)  4. Macrocytic anemia Recent B12 level had look good earlier this year no need to repeat - ANA - CBC - Sedimentation rate - Rheumatoid factor - C-reactive protein - CK (Creatine Kinase)  5. Severe back pain Continue pain medicine 3 times a day she does not want to increase it, utilize low-dose prednisone taper over the next 9 days this should help with the back discomfort as well as possibly the arthritic inflammation - ANA - CBC - Sedimentation rate - Rheumatoid factor - C-reactive protein - CK (Creatine Kinase) Cannot rule out the possibility of polymyalgia rheumatica await the lab results

## 2020-04-25 LAB — CBC
Hematocrit: 34 % (ref 34.0–46.6)
Hemoglobin: 11 g/dL — ABNORMAL LOW (ref 11.1–15.9)
MCH: 32.7 pg (ref 26.6–33.0)
MCHC: 32.4 g/dL (ref 31.5–35.7)
MCV: 101 fL — ABNORMAL HIGH (ref 79–97)
Platelets: 392 10*3/uL (ref 150–450)
RBC: 3.36 x10E6/uL — ABNORMAL LOW (ref 3.77–5.28)
RDW: 11.3 % — ABNORMAL LOW (ref 11.7–15.4)
WBC: 9.8 10*3/uL (ref 3.4–10.8)

## 2020-04-25 LAB — CK: Total CK: 26 U/L — ABNORMAL LOW (ref 32–182)

## 2020-04-25 LAB — SEDIMENTATION RATE: Sed Rate: 35 mm/hr (ref 0–40)

## 2020-04-25 LAB — ANA: Anti Nuclear Antibody (ANA): NEGATIVE

## 2020-04-25 LAB — RHEUMATOID FACTOR: Rheumatoid fact SerPl-aCnc: 10 IU/mL (ref <14.0)

## 2020-04-25 LAB — C-REACTIVE PROTEIN: CRP: 12 mg/L — ABNORMAL HIGH (ref 0–10)

## 2020-04-30 NOTE — Telephone Encounter (Signed)
error 

## 2020-06-26 ENCOUNTER — Other Ambulatory Visit: Payer: Self-pay

## 2020-06-26 ENCOUNTER — Ambulatory Visit (INDEPENDENT_AMBULATORY_CARE_PROVIDER_SITE_OTHER): Payer: Medicare Other | Admitting: Family Medicine

## 2020-06-26 VITALS — BP 130/78 | Temp 97.1°F | Ht 62.0 in | Wt 104.2 lb

## 2020-06-26 DIAGNOSIS — M549 Dorsalgia, unspecified: Secondary | ICD-10-CM

## 2020-06-26 DIAGNOSIS — Z79891 Long term (current) use of opiate analgesic: Secondary | ICD-10-CM | POA: Diagnosis not present

## 2020-06-26 DIAGNOSIS — I480 Paroxysmal atrial fibrillation: Secondary | ICD-10-CM | POA: Diagnosis not present

## 2020-06-26 DIAGNOSIS — F439 Reaction to severe stress, unspecified: Secondary | ICD-10-CM | POA: Diagnosis not present

## 2020-06-26 NOTE — Progress Notes (Signed)
Subjective:    Patient ID: [name], female    DOB: [birth year], 80 y.o.   MRN: [id number]  HPI This patient was seen today for chronic pain  The medication list was reviewed and updated.   -Compliance with medication: yes  - Number patient states they take daily: 3  -when was the last dose patient took? today  The patient was advised the importance of maintaining medication and not using illegal substances with these.  Here for refills and follow up  The patient was educated that we can provide 3 monthly scripts for their medication, it is their responsibility to follow the instructions.  Side effects or complications from medications: none  Patient is aware that pain medications are meant to minimize the severity of the pain to allow their pain levels to improve to allow for better function. They are aware of that pain medications cannot totally remove their pain.  Due for UDT ( at least once per year) : 03/26/20  Patient is also dealing with stress related to her husband having Alzheimer's she has numerous amount of questions on how to deal with this.   Encounter for long-term opiate analgesic use  Severe back pain  Paroxysmal atrial fibrillation (HCC)  Stress     Review of Systems  Constitutional: Negative for activity change and appetite change.  HENT: Negative for congestion and rhinorrhea.   Respiratory: Negative for cough and shortness of breath.   Cardiovascular: Negative for chest pain and leg swelling.  Gastrointestinal: Negative for abdominal pain, nausea and vomiting.  Musculoskeletal: Positive for arthralgias and back pain.  Skin: Negative for color change.  Neurological: Negative for dizziness and weakness.  Psychiatric/Behavioral: Negative for agitation and confusion.       Objective:   Physical Exam Vitals reviewed.  Constitutional:      General: She is not in acute distress.    Appearance: She is well-nourished.  HENT:     Head:  Normocephalic and atraumatic.  Eyes:     General:        Right eye: No discharge.        Left eye: No discharge.  Neck:     Trachea: No tracheal deviation.  Cardiovascular:     Rate and Rhythm: Normal rate and regular rhythm.     Heart sounds: Normal heart sounds. No murmur heard.   Pulmonary:     Effort: Pulmonary effort is normal. No respiratory distress.     Breath sounds: Normal breath sounds.  Musculoskeletal:        General: No edema.  Lymphadenopathy:     Cervical: No cervical adenopathy.  Skin:    General: Skin is warm and dry.  Neurological:     Mental Status: She is alert.     Coordination: Coordination normal.  Psychiatric:        Mood and Affect: Mood and affect normal.        Behavior: Behavior normal.           Assessment & Plan:  1. Encounter for long-term opiate analgesic use The patient was seen in followup for chronic pain. A review over at their current pain status was discussed. Drug registry was checked. Prescriptions were given.  Regular follow-up recommended. Discussion was held regarding the importance of compliance with medication as well as pain medication contract.  Patient was informed that medication may cause drowsiness and should not be combined  with other medications/alcohol or street drugs. If the patient feels medication is  causing altered alertness then do not drive or operate dangerous equipment.  3 scripts were sent into her pharmacy Patient denies overusing medicine States it does not cause drowsiness She states he functions well Without the pain medicine she would have a difficult time functioning.  2. Severe back pain Please see above  3. Paroxysmal atrial fibrillation (HCC) Rate is under good control tolerating the anticoagulant well no bleeding issues  4. Stress Significant stressors related to her husband's diagnosis of Alzheimer's we talked at length how to cope with caretaking.  Printouts were given from  Alzheimer's organization regarding on how to deal with this  Follow-up in 3 months sooner problems 30 minutes spent with patient discussing all these aspects chart preparation and documentation

## 2020-07-02 MED ORDER — HYDROCODONE-ACETAMINOPHEN 10-325 MG PO TABS: ORAL_TABLET | ORAL | 0 refills | Status: DC

## 2020-07-09 ENCOUNTER — Other Ambulatory Visit: Payer: Self-pay | Admitting: *Deleted

## 2020-07-09 DIAGNOSIS — R1900 Intra-abdominal and pelvic swelling, mass and lump, unspecified site: Secondary | ICD-10-CM

## 2020-07-09 DIAGNOSIS — R9389 Abnormal findings on diagnostic imaging of other specified body structures: Secondary | ICD-10-CM

## 2020-07-10 ENCOUNTER — Telehealth: Payer: Self-pay | Admitting: Family Medicine

## 2020-07-16 ENCOUNTER — Ambulatory Visit (HOSPITAL_COMMUNITY): Admission: RE | Admit: 2020-07-16 | Payer: Medicare Other | Source: Ambulatory Visit

## 2020-07-19 ENCOUNTER — Other Ambulatory Visit: Payer: Self-pay

## 2020-07-19 ENCOUNTER — Ambulatory Visit (HOSPITAL_COMMUNITY)
Admission: RE | Admit: 2020-07-19 | Discharge: 2020-07-19 | Disposition: A | Discharge status: Home / Self Care | Payer: Medicare Other | Source: Ambulatory Visit | Attending: Family Medicine | Admitting: Family Medicine

## 2020-07-19 DIAGNOSIS — N838 Other noninflammatory disorders of ovary, fallopian tube and broad ligament: Secondary | ICD-10-CM | POA: Diagnosis not present

## 2020-07-19 DIAGNOSIS — N83291 Other ovarian cyst, right side: Secondary | ICD-10-CM | POA: Diagnosis not present

## 2020-07-19 DIAGNOSIS — R9389 Abnormal findings on diagnostic imaging of other specified body structures: Secondary | ICD-10-CM | POA: Insufficient documentation

## 2020-07-19 DIAGNOSIS — R1900 Intra-abdominal and pelvic swelling, mass and lump, unspecified site: Secondary | ICD-10-CM | POA: Insufficient documentation

## 2020-07-25 ENCOUNTER — Other Ambulatory Visit (HOSPITAL_COMMUNITY): Payer: Self-pay | Admitting: Family Medicine

## 2020-07-25 DIAGNOSIS — Z1231 Encounter for screening mammogram for malignant neoplasm of breast: Secondary | ICD-10-CM

## 2020-08-02 ENCOUNTER — Telehealth: Payer: Self-pay | Admitting: Family Medicine

## 2020-08-02 NOTE — Telephone Encounter (Signed)
Message was left for pt's husband earl and I spoke with her about message

## 2020-08-02 NOTE — Telephone Encounter (Signed)
Nurse---Patient is returning call from the nurse yesterday.

## 2020-08-16 ENCOUNTER — Telehealth: Payer: Self-pay | Admitting: Family Medicine

## 2020-08-16 NOTE — Telephone Encounter (Signed)
Patient line busy no voicemail

## 2020-08-28 ENCOUNTER — Other Ambulatory Visit: Payer: Self-pay

## 2020-08-28 ENCOUNTER — Other Ambulatory Visit (HOSPITAL_COMMUNITY): Payer: Self-pay | Admitting: Family Medicine

## 2020-08-28 ENCOUNTER — Ambulatory Visit (HOSPITAL_COMMUNITY)
Admission: RE | Admit: 2020-08-28 | Discharge: 2020-08-28 | Disposition: A | Discharge status: Home / Self Care | Payer: Medicare Other | Source: Ambulatory Visit | Attending: Family Medicine | Admitting: Family Medicine

## 2020-08-28 DIAGNOSIS — Z1231 Encounter for screening mammogram for malignant neoplasm of breast: Secondary | ICD-10-CM | POA: Insufficient documentation

## 2020-08-28 DIAGNOSIS — R928 Other abnormal and inconclusive findings on diagnostic imaging of breast: Secondary | ICD-10-CM

## 2020-09-03 ENCOUNTER — Other Ambulatory Visit: Payer: Self-pay

## 2020-09-03 ENCOUNTER — Ambulatory Visit (HOSPITAL_COMMUNITY)
Admission: RE | Admit: 2020-09-03 | Discharge: 2020-09-03 | Disposition: A | Discharge status: Home / Self Care | Payer: Medicare Other | Source: Ambulatory Visit | Attending: Family Medicine | Admitting: Family Medicine

## 2020-09-03 DIAGNOSIS — R928 Other abnormal and inconclusive findings on diagnostic imaging of breast: Secondary | ICD-10-CM | POA: Insufficient documentation

## 2020-09-03 DIAGNOSIS — R922 Inconclusive mammogram: Secondary | ICD-10-CM | POA: Diagnosis not present

## 2020-09-24 ENCOUNTER — Other Ambulatory Visit: Payer: Self-pay

## 2020-09-24 ENCOUNTER — Ambulatory Visit (INDEPENDENT_AMBULATORY_CARE_PROVIDER_SITE_OTHER): Payer: Medicare Other | Admitting: Family Medicine

## 2020-09-24 VITALS — BP 116/65 | HR 60 | Temp 97.0°F | Ht 62.0 in | Wt 103.0 lb

## 2020-09-24 DIAGNOSIS — E7849 Other hyperlipidemia: Secondary | ICD-10-CM

## 2020-09-24 DIAGNOSIS — Z79891 Long term (current) use of opiate analgesic: Secondary | ICD-10-CM

## 2020-09-24 DIAGNOSIS — Z1159 Encounter for screening for other viral diseases: Secondary | ICD-10-CM | POA: Diagnosis not present

## 2020-09-24 DIAGNOSIS — Z5181 Encounter for therapeutic drug level monitoring: Secondary | ICD-10-CM

## 2020-09-24 DIAGNOSIS — I10 Essential (primary) hypertension: Secondary | ICD-10-CM | POA: Diagnosis not present

## 2020-09-24 DIAGNOSIS — G8929 Other chronic pain: Secondary | ICD-10-CM

## 2020-09-24 DIAGNOSIS — M5440 Lumbago with sciatica, unspecified side: Secondary | ICD-10-CM | POA: Diagnosis not present

## 2020-09-24 DIAGNOSIS — D509 Iron deficiency anemia, unspecified: Secondary | ICD-10-CM | POA: Diagnosis not present

## 2020-09-24 DIAGNOSIS — F439 Reaction to severe stress, unspecified: Secondary | ICD-10-CM | POA: Diagnosis not present

## 2020-09-24 NOTE — Progress Notes (Deleted)
   Subjective:    Patient ID: [name], female    DOB: November 24, 1940, 80 y.o.   MRN: [id number]  HPI  This patient was seen today for chronic pain  The medication list was reviewed and updated.  Location of Pain for which the patient has been treated with regarding narcotics: ***  Onset of this pain: ***   -Compliance with medication: ***  - Number patient states they take daily: ***  -when was the last dose patient took? ***  The patient was advised the importance of maintaining medication and not using illegal substances with these.  Here for refills and follow up  The patient was educated that we can provide 3 monthly scripts for their medication, it is their responsibility to follow the instructions.  Side effects or complications from medications: ***  Patient is aware that pain medications are meant to minimize the severity of the pain to allow their pain levels to improve to allow for better function. They are aware of that pain medications cannot totally remove their pain.  Due for UDT ( at least once per year) : ***  Scale of 1 to 10 ( 1 is least 10 is most) Your pain level without the medicine: *** Your pain level with medication ***  Scale 1 to 10 ( 1-helps very little, 10 helps very well) How well does your pain medication reduce your pain so you can function better through out the day? ***  Quality of the pain: ***  Persistence of the pain: ***  Modifying factors: ***      Review of Systems     Objective:   Physical Exam        Assessment & Plan:

## 2020-09-24 NOTE — Progress Notes (Signed)
Subjective:    Patient ID: [name], female    DOB: 02/18/1941, 80 y.o.   MRN: [id number]  HPI This patient was seen today for chronic pain  The medication list was reviewed and updated.  Location of Pain for which the patient has been treated with regarding narcotics: Lumbar spine radiates into the buttocks  Onset of this pain: Been present for several years   -Compliance with medication: Relates compliance with medicine  - Number patient states they take daily: No more than 3/day  -when was the last dose patient took?  Earlier today  The patient was advised the importance of maintaining medication and not using illegal substances with these.  Here for refills and follow up  The patient was educated that we can provide 3 monthly scripts for their medication, it is their responsibility to follow the instructions.  Side effects or complications from medications: Denies side effects with medicine denies constipation denies drowsiness  Patient is aware that pain medications are meant to minimize the severity of the pain to allow their pain levels to improve to allow for better function. They are aware of that pain medications cannot totally remove their pain.  Due for UDT ( at least once per year) : On next visit  Scale of 1 to 10 ( 1 is least 10 is most) Your pain level without the medicine: 8 Your pain level with medication 4  Scale 1 to 10 ( 1-helps very little, 10 helps very well) How well does your pain medication reduce your pain so you can function better through out the day? 7  Quality of the pain: Throbbing aching burning  Persistence of the pain: Persistent in the low back worse with activity rarely radiates into the left buttock  Modifying factors: Worse with activity   Iron deficiency anemia, unspecified iron deficiency anemia type - Plan: Hepatitis C antibody, CBC with Differential/Platelet, Comprehensive metabolic panel, Lipid panel  Other  hyperlipidemia - Plan: Hepatitis C antibody, CBC with Differential/Platelet, Comprehensive metabolic panel, Lipid panel  Hypertension, unspecified type - Plan: Hepatitis C antibody, CBC with Differential/Platelet, Comprehensive metabolic panel, Lipid panel  Encounter for monitoring opioid maintenance therapy - Plan: Hepatitis C antibody, CBC with Differential/Platelet, Comprehensive metabolic panel, Lipid panel  Chronic bilateral low back pain with sciatica, sciatica laterality unspecified - Plan: Hepatitis C antibody, CBC with Differential/Platelet, Comprehensive metabolic panel, Lipid panel  Need for hepatitis C screening test - Plan: Hepatitis C antibody, CBC with Differential/Platelet, Comprehensive metabolic panel, Lipid panel  Stress at home   She relates a lot of fatigue tiredness feeling rundown it is hard to know if this is related to the stress and fatigue associated with caregiving her husband has Alzheimer's she denies rectal bleeding she states she is eating okay      Review of Systems     Objective:   Physical Exam        Assessment & Plan:  1. Iron deficiency anemia, unspecified iron deficiency anemia type Will check lab work.  Of CBC patient was encouraged to eat healthy stay active - Hepatitis C antibody - CBC with Differential/Platelet - Comprehensive metabolic panel - Lipid panel  2. Other hyperlipidemia Check lipid profile watch diet continue rosuvastatin - Hepatitis C antibody - CBC with Differential/Platelet - Comprehensive metabolic panel - Lipid panel  3. Hypertension, unspecified type Blood pressure good control continue current measures - Hepatitis C antibody - CBC with Differential/Platelet - Comprehensive metabolic panel - Lipid panel  4. Encounter  for monitoring opioid maintenance therapy The patient was seen in followup for chronic pain. A review over at their current pain status was discussed. Drug registry was checked. Prescriptions  were given.  Regular follow-up recommended. Discussion was held regarding the importance of compliance with medication as well as pain medication contract.  Patient was informed that medication may cause drowsiness and should not be combined  with other medications/alcohol or street drugs. If the patient feels medication is causing altered alertness then do not drive or operate dangerous equipment.  Scripts were sent in drug registry was checked in my opinion the benefit of the medicine outweighs the risk patient desires to continue the medication if possible on some days reduce pain medicine in half on any given dose when pain is not as severe - Hepatitis C antibody - CBC with Differential/Platelet - Comprehensive metabolic panel - Lipid panel  5. Chronic bilateral low back pain with sciatica, sciatica laterality unspecified Please see above stretching exercises stay active - Hepatitis C antibody - CBC with Differential/Platelet - Comprehensive metabolic panel - Lipid panel  6. Need for hepatitis C screening test Screening recommended - Hepatitis C antibody - CBC with Differential/Platelet - Comprehensive metabolic panel - Lipid panel Follow-up 3 months

## 2020-09-30 DIAGNOSIS — Z79891 Long term (current) use of opiate analgesic: Secondary | ICD-10-CM | POA: Diagnosis not present

## 2020-09-30 DIAGNOSIS — D509 Iron deficiency anemia, unspecified: Secondary | ICD-10-CM | POA: Diagnosis not present

## 2020-09-30 DIAGNOSIS — G8929 Other chronic pain: Secondary | ICD-10-CM | POA: Diagnosis not present

## 2020-09-30 DIAGNOSIS — I10 Essential (primary) hypertension: Secondary | ICD-10-CM | POA: Diagnosis not present

## 2020-09-30 DIAGNOSIS — M5440 Lumbago with sciatica, unspecified side: Secondary | ICD-10-CM | POA: Diagnosis not present

## 2020-09-30 DIAGNOSIS — E7849 Other hyperlipidemia: Secondary | ICD-10-CM | POA: Diagnosis not present

## 2020-09-30 DIAGNOSIS — Z5181 Encounter for therapeutic drug level monitoring: Secondary | ICD-10-CM | POA: Diagnosis not present

## 2020-09-30 DIAGNOSIS — Z1159 Encounter for screening for other viral diseases: Secondary | ICD-10-CM | POA: Diagnosis not present

## 2020-10-01 ENCOUNTER — Encounter: Payer: Self-pay | Admitting: Family Medicine

## 2020-10-01 LAB — LIPID PANEL
Chol/HDL Ratio: 3 ratio (ref 0.0–4.4)
Cholesterol, Total: 176 mg/dL (ref 100–199)
HDL: 59 mg/dL (ref >39)
LDL Chol Calc (NIH): 87 mg/dL (ref 0–99)
Triglycerides: 177 mg/dL — ABNORMAL HIGH (ref 0–149)
VLDL Cholesterol Cal: 30 mg/dL (ref 5–40)

## 2020-10-01 LAB — CBC WITH DIFFERENTIAL/PLATELET
Basophils Absolute: 0 10*3/uL (ref 0.0–0.2)
Basos: 0 %
EOS (ABSOLUTE): 0.2 10*3/uL (ref 0.0–0.4)
Eos: 3 %
Hematocrit: 36.7 % (ref 34.0–46.6)
Hemoglobin: 11.9 g/dL (ref 11.1–15.9)
Immature Grans (Abs): 0 10*3/uL (ref 0.0–0.1)
Immature Granulocytes: 0 %
Lymphocytes Absolute: 3.8 10*3/uL — ABNORMAL HIGH (ref 0.7–3.1)
Lymphs: 45 %
MCH: 32.6 pg (ref 26.6–33.0)
MCHC: 32.4 g/dL (ref 31.5–35.7)
MCV: 101 fL — ABNORMAL HIGH (ref 79–97)
Monocytes Absolute: 0.6 10*3/uL (ref 0.1–0.9)
Monocytes: 8 %
Neutrophils Absolute: 3.7 10*3/uL (ref 1.4–7.0)
Neutrophils: 44 %
Platelets: 292 10*3/uL (ref 150–450)
RBC: 3.65 x10E6/uL — ABNORMAL LOW (ref 3.77–5.28)
RDW: 11.7 % (ref 11.7–15.4)
WBC: 8.4 10*3/uL (ref 3.4–10.8)

## 2020-10-01 LAB — COMPREHENSIVE METABOLIC PANEL
ALT: 15 IU/L (ref 0–32)
AST: 20 IU/L (ref 0–40)
Albumin/Globulin Ratio: 2.5 — ABNORMAL HIGH (ref 1.2–2.2)
Albumin: 4.7 g/dL (ref 3.7–4.7)
Alkaline Phosphatase: 79 IU/L (ref 44–121)
BUN/Creatinine Ratio: 21 (ref 12–28)
BUN: 21 mg/dL (ref 8–27)
Bilirubin Total: 0.4 mg/dL (ref 0.0–1.2)
CO2: 24 mmol/L (ref 20–29)
Calcium: 9.3 mg/dL (ref 8.7–10.3)
Chloride: 107 mmol/L — ABNORMAL HIGH (ref 96–106)
Creatinine, Ser: 1 mg/dL (ref 0.57–1.00)
Globulin, Total: 1.9 g/dL (ref 1.5–4.5)
Glucose: 97 mg/dL (ref 65–99)
Potassium: 4.3 mmol/L (ref 3.5–5.2)
Sodium: 145 mmol/L — ABNORMAL HIGH (ref 134–144)
Total Protein: 6.6 g/dL (ref 6.0–8.5)
eGFR: 58 mL/min/{1.73_m2} — ABNORMAL LOW (ref >59)

## 2020-10-01 LAB — HEPATITIS C ANTIBODY: Hep C Virus Ab: <0.1 s/co ratio (ref 0.0–0.9)

## 2020-10-08 ENCOUNTER — Other Ambulatory Visit: Payer: Self-pay | Admitting: Cardiology

## 2020-10-08 ENCOUNTER — Other Ambulatory Visit: Payer: Self-pay | Admitting: Family Medicine

## 2020-10-08 NOTE — Telephone Encounter (Signed)
Nurses-I checked PDMP-it did not reveal when she got her last prescription filled  #1 please check with pharmacy when she got her last prescription filled #2 please do 3 additional prescriptions with each date 30 days, 60 days, 90 days after the last prescription was filled Please calculate those dates and put those into the prescriptions Please pend then I will sign

## 2020-10-09 ENCOUNTER — Other Ambulatory Visit: Payer: Self-pay

## 2020-10-09 MED ORDER — HYDROCODONE-ACETAMINOPHEN 10-325 MG PO TABS: ORAL_TABLET | ORAL | 0 refills | Status: DC

## 2020-10-09 MED ORDER — HYDROCODONE-ACETAMINOPHEN 10-325 MG PO TABS: ORAL_TABLET | ORAL | 0 refills | Status: AC

## 2020-10-17 ENCOUNTER — Other Ambulatory Visit: Payer: Self-pay

## 2020-10-17 ENCOUNTER — Ambulatory Visit (INDEPENDENT_AMBULATORY_CARE_PROVIDER_SITE_OTHER): Payer: Medicare Other | Admitting: Nurse Practitioner

## 2020-10-17 VITALS — BP 145/71 | HR 64 | Temp 97.2°F | Ht 62.0 in | Wt 103.0 lb

## 2020-10-17 DIAGNOSIS — M5441 Lumbago with sciatica, right side: Secondary | ICD-10-CM

## 2020-10-17 DIAGNOSIS — G8929 Other chronic pain: Secondary | ICD-10-CM

## 2020-10-17 MED ORDER — AMITRIPTYLINE HCL 10 MG PO TABS: 10.0000 mg | ORAL_TABLET | Freq: Every day | ORAL | 0 refills | Status: DC

## 2020-10-17 NOTE — Progress Notes (Signed)
   Subjective:    Patient ID: [name], female    DOB: [birth year], 80 y.o.   MRN: [id number]  HPI  R hip pain , thigh, knee pain x 1 week. Presents for complaints of a flareup of her back pain for the past week.  Denies any injury. Pain radiates from the right low back through her buttock right hip lateral thigh to the anterior leg just above the knee.  Describes as a burning pain.  No weakness in the leg.  Has been applying ice or heat.  Has hydrocodone 10/325 that she takes 3 times a day for pain describes it as 12 at the worst medication brings it down to 7-8.  Patient also has chronic pain in general and multiple joints.  Has had back surgery in the past.  Her last bone density was 2016 according to her record.       Objective:   Physical Exam NAD.  Alert, oriented.  Patient frequently rubbing her back on the right side and in her right hip.  Lungs clear.  Heart regular rate rhythm.  Distinct tenderness noted in the right lumbar paraspinal area into the right hip.  SLR negative on the left, positive on the right.  Normal internal and external rotation of the hip without pain.  Patellar and Achilles reflexes normal and equal bilaterally.  Gait very slow but steady. See MRI report lumbar spine dated 04/18/2020.      Assessment & Plan:  Chronic right-sided low back pain with right-sided sciatica - Plan: Ambulatory referral to Pain Clinic Continue current regimen for pain. Meds ordered this encounter  Medications   amitriptyline (ELAVIL) 10 MG tablet    Sig: Take 1 tablet (10 mg total) by mouth at bedtime. For back pain    Dispense:  30 tablet    Refill:  0    Order Specific Question:   Supervising Provider    Answer:   Sallee Lange A [[id number]]   Add low-dose amitriptyline at nighttime to help pain and hopefully help her sleep.  Reviewed potential adverse effects.  Discontinue medication if any problems. Due to the chronicity of her pain and multiple joint involvement, will refer to  integrated pain and spine clinic in [facility] for further evaluation.  Patient agrees with this plan.  Call back in the meantime if needed.

## 2020-10-17 NOTE — Patient Instructions (Signed)
Biofreeze Lidocaine Ice/heat

## 2020-10-18 ENCOUNTER — Encounter: Payer: Self-pay | Admitting: Nurse Practitioner

## 2020-10-25 ENCOUNTER — Other Ambulatory Visit: Payer: Self-pay | Admitting: Family Medicine

## 2020-11-06 ENCOUNTER — Other Ambulatory Visit: Payer: Self-pay | Admitting: *Deleted

## 2020-11-06 DIAGNOSIS — R9389 Abnormal findings on diagnostic imaging of other specified body structures: Secondary | ICD-10-CM

## 2020-11-06 DIAGNOSIS — R1900 Intra-abdominal and pelvic swelling, mass and lump, unspecified site: Secondary | ICD-10-CM

## 2020-11-06 DIAGNOSIS — N949 Unspecified condition associated with female genital organs and menstrual cycle: Secondary | ICD-10-CM

## 2020-11-08 DIAGNOSIS — Z23 Encounter for immunization: Secondary | ICD-10-CM | POA: Diagnosis not present

## 2020-11-18 ENCOUNTER — Ambulatory Visit (HOSPITAL_COMMUNITY)
Admission: RE | Admit: 2020-11-18 | Discharge: 2020-11-18 | Disposition: A | Discharge status: Home / Self Care | Payer: Medicare Other | Source: Ambulatory Visit | Attending: Family Medicine | Admitting: Family Medicine

## 2020-11-18 ENCOUNTER — Other Ambulatory Visit: Payer: Self-pay

## 2020-11-18 DIAGNOSIS — N949 Unspecified condition associated with female genital organs and menstrual cycle: Secondary | ICD-10-CM | POA: Diagnosis not present

## 2020-11-18 DIAGNOSIS — N83201 Unspecified ovarian cyst, right side: Secondary | ICD-10-CM | POA: Diagnosis not present

## 2020-11-18 DIAGNOSIS — R9389 Abnormal findings on diagnostic imaging of other specified body structures: Secondary | ICD-10-CM | POA: Diagnosis not present

## 2020-11-18 DIAGNOSIS — R1900 Intra-abdominal and pelvic swelling, mass and lump, unspecified site: Secondary | ICD-10-CM | POA: Insufficient documentation

## 2020-11-18 DIAGNOSIS — Z90710 Acquired absence of both cervix and uterus: Secondary | ICD-10-CM | POA: Diagnosis not present

## 2020-12-26 ENCOUNTER — Ambulatory Visit (INDEPENDENT_AMBULATORY_CARE_PROVIDER_SITE_OTHER): Payer: Medicare Other | Admitting: Family Medicine

## 2020-12-26 ENCOUNTER — Other Ambulatory Visit: Payer: Self-pay

## 2020-12-26 VITALS — BP 108/70 | HR 80 | Ht 62.0 in | Wt 104.2 lb

## 2020-12-26 DIAGNOSIS — G8929 Other chronic pain: Secondary | ICD-10-CM

## 2020-12-26 DIAGNOSIS — M5441 Lumbago with sciatica, right side: Secondary | ICD-10-CM

## 2020-12-26 DIAGNOSIS — Z79891 Long term (current) use of opiate analgesic: Secondary | ICD-10-CM | POA: Diagnosis not present

## 2020-12-26 DIAGNOSIS — N949 Unspecified condition associated with female genital organs and menstrual cycle: Secondary | ICD-10-CM

## 2020-12-26 DIAGNOSIS — M8580 Other specified disorders of bone density and structure, unspecified site: Secondary | ICD-10-CM

## 2020-12-26 DIAGNOSIS — Z780 Asymptomatic menopausal state: Secondary | ICD-10-CM

## 2020-12-26 DIAGNOSIS — Z5181 Encounter for therapeutic drug level monitoring: Secondary | ICD-10-CM | POA: Diagnosis not present

## 2020-12-26 MED ORDER — HYDROCODONE-ACETAMINOPHEN 10-325 MG PO TABS: ORAL_TABLET | ORAL | 0 refills | Status: AC

## 2020-12-26 NOTE — Progress Notes (Signed)
   Subjective:    Patient ID: [name], female    DOB: 08/14/1940, 80 y.o.   MRN: [id number]  HPI This patient was seen today for chronic pain  The medication list was reviewed and updated.  Location of Pain for which the patient has been treated with regarding narcotics: back pain  Onset of this pain: years   -Compliance with medication: yes  - Number patient states they take daily: 3 a day  -when was the last dose patient took? today  The patient was advised the importance of maintaining medication and not using illegal substances with these.  Here for refills and follow up  The patient was educated that we can provide 3 monthly scripts for their medication, it is their responsibility to follow the instructions.  Side effects or complications from medications: none  Patient is aware that pain medications are meant to minimize the severity of the pain to allow their pain levels to improve to allow for better function. They are aware of that pain medications cannot totally remove their pain.  Due for UDT ( at least once per year) : 03/2020   Discuss results of last scan       Review of Systems     Objective:   Physical Exam  General-in no acute distress Eyes-no discharge Lungs-respiratory rate normal, CTA CV-no murmurs,RRR Extremities skin warm dry no edema Neuro grossly normal Behavior normal, alert       Assessment & Plan:  1. Chronic right-sided low back pain with right-sided sciatica Patient with ongoing low back pain pain medicine does allow her to function better she denies abusing it takes 3/day  2. Encounter for monitoring opioid maintenance therapy The patient was seen in followup for chronic pain. A review over at their current pain status was discussed. Drug registry was checked. Prescriptions were given.  Regular follow-up recommended. Discussion was held regarding the importance of compliance with medication as well as pain medication  contract.  Patient was informed that medication may cause drowsiness and should not be combined  with other medications/alcohol or street drugs. If the patient feels medication is causing altered alertness then do not drive or operate dangerous equipment. Urine drug screen on a yearly basis  Pain medicine does a good job helping her function she does not abuse it does not cause drowsiness continue current dosing  3. Osteopenia, unspecified location Will do a bone density scan between now next visit  Patient had a ovary ultrasound we will do another one in 1 years time

## 2021-01-02 ENCOUNTER — Other Ambulatory Visit: Payer: Self-pay

## 2021-01-02 ENCOUNTER — Ambulatory Visit (HOSPITAL_COMMUNITY)
Admission: RE | Admit: 2021-01-02 | Discharge: 2021-01-02 | Disposition: A | Discharge status: Home / Self Care | Payer: Medicare Other | Source: Ambulatory Visit | Attending: Family Medicine | Admitting: Family Medicine

## 2021-01-02 DIAGNOSIS — Z780 Asymptomatic menopausal state: Secondary | ICD-10-CM | POA: Diagnosis not present

## 2021-01-02 DIAGNOSIS — M8580 Other specified disorders of bone density and structure, unspecified site: Secondary | ICD-10-CM | POA: Insufficient documentation

## 2021-01-02 DIAGNOSIS — M8589 Other specified disorders of bone density and structure, multiple sites: Secondary | ICD-10-CM | POA: Diagnosis not present

## 2021-01-07 ENCOUNTER — Other Ambulatory Visit: Payer: Self-pay | Admitting: Cardiology

## 2021-02-05 ENCOUNTER — Other Ambulatory Visit: Payer: Self-pay | Admitting: Cardiology

## 2021-03-05 DIAGNOSIS — L821 Other seborrheic keratosis: Secondary | ICD-10-CM | POA: Diagnosis not present

## 2021-03-05 DIAGNOSIS — D225 Melanocytic nevi of trunk: Secondary | ICD-10-CM | POA: Diagnosis not present

## 2021-03-05 DIAGNOSIS — L858 Other specified epidermal thickening: Secondary | ICD-10-CM | POA: Diagnosis not present

## 2021-03-06 ENCOUNTER — Other Ambulatory Visit: Payer: Self-pay | Admitting: Cardiology

## 2021-03-06 NOTE — Progress Notes (Signed)
Cardiology Office Note    Date:  03/07/2021   ID:  [name], [name] 05-May-1941, MRN [id number]  PCP:  Kathyrn Drown, MD  Cardiologist: Carlyle Dolly, MD    Chief Complaint  Patient presents with   Follow-up    Routine Visit    History of Present Illness:    [name] is a 80 y.o. female with past medical history of palpitations (monitor in 08/2018 showing PAC's, PVC's and atrial tachycardia), remote history of atrial fibrillation (on Flecainide), orthostatic hypotension, anemia and HLD who presents to the office today for follow-up.   She was last examined by myself in 03/2020 and reported her dizziness had improved following dose titration of Midodrine to 5 mg 3 times daily. She denied any recent anginal symptoms at that time. She was continued on her current medication regimen.   In talking with the patient today, she reports overall doing well since her last visit. She denies any recent chest pain or palpitations. Breathing has been stable with no recent orthopnea, PND or lower extremity edema. She has been experiencing rhinorrhea for the past few days and took a COVID test which was negative (we reviewed OTC options which would be safe to take with her current medications if needed).   She denies any recent dizziness and is curious if Midodrine can be discontinued. She has been taking this 3 times daily.   Past Medical History:  Diagnosis Date   Adenocarcinoma, colon ([place]) 03/2005   Stage I; resected in 03/2005   Arthritis    Colon cancer ([place])    Removed surgicaly    Gastroesophageal reflux disease    Gastroparesis    Hearing loss    mild   Hyperlipidemia    Irregular heartbeat    Ovarian cyst 2008   Paroxysmal atrial fibrillation (HCC)    Palpitations; maintained on flecainide; -normal coronary angiography and 1999; negative stress nuclear study in 11/2005   PONV (postoperative nausea and vomiting)    Tobacco abuse, in remission    10-pack-years; quit in  2001    Past Surgical History:  Procedure Laterality Date   [place]     BIOPSY  10/18/2017   Procedure: BIOPSY;  Surgeon: [name], MD;  [facility];  Service: Endoscopy;;  gastric   BIOPSY  07/06/2018   Procedure: BIOPSY;  Surgeon: [name], MD;  [facility];  Service: Endoscopy;;  gastric   CHOLECYSTECTOMY     COLON SURGERY  2006   Colon cancer   COLONOSCOPY  03/25/2012   [name], MD   COLONOSCOPY N/A 04/26/2017   Procedure: COLONOSCOPY;  Surgeon: [name], MD;  [facility];  Service: Endoscopy;  Laterality: N/A;  2:00   COLONOSCOPY N/A 07/06/2018   Procedure: COLONOSCOPY;  Surgeon: [name], MD;  [facility];  Service: Endoscopy;  Laterality: N/A;  10:30   COSMETIC SURGERY  09/2007   ESOPHAGOGASTRODUODENOSCOPY N/A 10/18/2017   Procedure: ESOPHAGOGASTRODUODENOSCOPY (EGD);  Surgeon: [name], MD;  [facility];  Service: Endoscopy;  Laterality: N/A;   ESOPHAGOGASTRODUODENOSCOPY N/A 07/06/2018   Procedure: ESOPHAGOGASTRODUODENOSCOPY (EGD);  Surgeon: [name], MD;  [facility];  Service: Endoscopy;  Laterality: N/A;   GIVENS CAPSULE STUDY N/A 12/26/2019   Procedure: GIVENS CAPSULE STUDY;  Surgeon: [name], MD;  [facility];  Service: Endoscopy;  Laterality: N/A;  37   PARTIAL COLECTOMY  2006   adenocarcinoma   POLYPECTOMY  04/26/2017   Procedure: POLYPECTOMY;  Surgeon: [name], MD;  [facility];  Service: Endoscopy;;  colon   POLYPECTOMY  07/06/2018   Procedure: POLYPECTOMY;  Surgeon: [name], MD;  [facility];  Service: Endoscopy;;  colon    TONSILLECTOMY      Current Medications: Outpatient Medications Prior to Visit  Medication Sig Dispense Refill   Calcium Carbonate (CALCIUM 600 PO) Take by mouth.     Ferrous Sulfate (IRON PO) Take by mouth. 1 tab once a day      flecainide (TAMBOCOR) 100 MG tablet TAKE 1 TABLET BY MOUTH EVERY 12 HOURS 60 tablet 3   lactulose (CHRONULAC) 10 GM/15ML solution Take 20 g by mouth at bedtime.     omeprazole (PRILOSEC) 20 MG capsule Take 20 mg by mouth daily before breakfast.      vitamin B-12 (CYANOCOBALAMIN) 1000 MCG tablet Take 1,000 mcg by mouth daily.     ELIQUIS 5 MG TABS tablet TAKE ONE TABLET BY MOUTH TWICE A DAY 60 tablet 11   midodrine (PROAMATINE) 5 MG tablet Take 1 tablet (5 mg total) by mouth 3 (three) times daily with meals. 270 tablet 3   rosuvastatin (CRESTOR) 40 MG tablet 1 qd 90 tablet 3   No facility-administered medications prior to visit.     Allergies:   Patient has no known allergies.   Social History   Socioeconomic History   Marital status: Married    Spouse name: Not on file   Number of children: Not on file   Years of education: Not on file   Highest education level: Not on file  Occupational History   Occupation: Bookkeeper  Tobacco Use   Smoking status: Former    Packs/day: 0.30    Years: 40.00    Pack years: 12.00    Types: Cigarettes    Start date: 02/23/1963    Quit date: 03/25/2002    Years since quitting: 18.9   Smokeless tobacco: Never   Tobacco comments:    Quit smoking in 2001  Vaping Use   Vaping Use: Never used  Substance and Sexual Activity   Alcohol use: No    Alcohol/week: 0.0 standard drinks   Drug use: No   Sexual activity: Yes    Birth control/protection: Surgical  Other Topics Concern   Not on file  Social History Narrative   Lives with husband in a one story home.  Has 3 children.  Retired from Medical sales representative work.  Education: some college.   Social Determinants of Health   Financial Resource Strain: Not on file  Food Insecurity: Not on file  Transportation Needs: Not on file  Physical Activity: Not on file  Stress: Not on file  Social Connections: Not on file     Family History:  The patient's family history includes Cancer in her brother and  father; Healthy in her son; Heart attack in her mother; Heart disease in her father and mother; Heart failure in her sister; Hypertension in her father; Stroke in her mother and sister.   Review of Systems:    Please see the history of present illness.     All other systems reviewed and are otherwise negative except as noted above.   Physical Exam:    VS:  BP 128/62   Pulse 66   Ht 5\' 3"  (1.6 m)   Wt 103 lb (46.7 kg)  SpO2 99%   BMI 18.25 kg/m    General: Pleasant, thin female appearing in no acute distress. Head: Normocephalic, atraumatic. Neck: No carotid bruits. JVD not elevated.  Lungs: Respirations regular and unlabored, without wheezes or rales.  Heart: Regular rate and rhythm. No S3 or S4.  No murmur, no rubs, or gallops appreciated. Abdomen: Appears non-distended. No obvious abdominal masses. Msk:  Strength and tone appear normal for age. No obvious joint deformities or effusions. Extremities: No clubbing or cyanosis. No pitting edema.  Distal pedal pulses are 2+ bilaterally. Neuro: Alert and oriented X 3. Moves all extremities spontaneously. No focal deficits noted. Psych:  Responds to questions appropriately with a normal affect. Skin: No rashes or lesions noted  Wt Readings from Last 3 Encounters:  03/07/21 103 lb (46.7 kg)  12/26/20 104 lb 3.2 oz (47.3 kg)  10/17/20 103 lb (46.7 kg)     Studies/Labs Reviewed:   EKG:  EKG is not ordered today.    Recent Labs: 09/30/2020: ALT 15; BUN 21; Creatinine, Ser 1.00; Hemoglobin 11.9; Platelets 292; Potassium 4.3; Sodium 145   Lipid Panel    Component Value Date/Time   CHOL 176 09/30/2020 0817   TRIG 177 (H) 09/30/2020 0817   HDL 59 09/30/2020 0817   CHOLHDL 3.0 09/30/2020 0817   CHOLHDL 3.0 03/19/2014 0712   VLDL 36 03/19/2014 0712   LDLCALC 87 09/30/2020 0817    Additional studies/ records that were reviewed today include:   NST: 08/2017 There was no ST segment deviation noted during stress. The study is  normal. There are no perfusion defects consistent with prior infarct or current ischemia. This is a low risk study. The left ventricular ejection fraction is normal (55-65%).  Event Monitor: 08/2018 48 hr holter monitor Min HR 50, Max HR 103, Avg HR 68 Rare ventricular ectopy, 2 isolated PVCs Rare supraventricular ectopy. Primarily PACs. Three short runs of atach, longest 4 beats. Reported symptoms correlated with sinus rhythm  Assessment:    1. Paroxysmal atrial fibrillation (HCC)   2. Palpitations   3. Orthostatic hypotension   4. Hyperlipidemia LDL goal <100      Plan:   In order of problems listed above:  1. Palpitations/History of Paroxysmal Atrial Fibrillation - She has a history of remote atrial fibrillation and also PAC's, PVC's and atrial tachycardia. She has been doing well on Flecainide and denies any recent palpitations. Continue at current dosing of 100mg  BID.  - She denies any evidence of active bleeding. Remains on Eliquis 5mg  BID for anticoagulation which is the appropriate dose at this time as her only indication for reduced dosing currently is her age. Labs in 09/2020 showed her Hgb was stable at 11.9 and platelets at 292.  2. Orthostatic Hypotension - She denies any recent dizziness and has increased her fluid intake over the past year. She does wish to stop Midodrine if possible and given she is currently taking 5mg  TID, I recommended she reduce this to 5mg  BID and she how she feels. If symptoms remain stable, can then do a trial off of it. If she has recurrent dizziness, she is aware to resume at her prior dosing.   3. HLD - Followed by her PCP. FLP in 09/2020 showed total cholesterol of 176, triglycerides 177, HDL 59 and LDL 87. She remains on Crestor 40mg  daily.    Medication Adjustments/Labs and Tests Ordered: Current medicines are reviewed at length with the patient today.  Concerns regarding medicines are outlined above.  Medication  changes, Labs and  Tests ordered today are listed in the Patient Instructions below. Patient Instructions  Medication Instructions:   DECREASE Midodrine to twice a day. If you do well with that decrease, you can the stop it.  *If you need a refill on your cardiac medications before your next appointment, please call your pharmacy*   Lab Work: None today  If you have labs (blood work) drawn today and your tests are completely normal, you will receive your results only by: [place] (if you have MyChart) OR A paper copy in the mail If you have any lab test that is abnormal or we need to change your treatment, we will call you to review the results.   Testing/Procedures: None today    Follow-Up: At [facility], you and your health needs are our priority.  As part of our continuing mission to provide you with exceptional heart care, we have created designated Provider Care Teams.  These Care Teams include your primary Cardiologist (physician) and Advanced Practice Providers (APPs -  Physician Assistants and Nurse Practitioners) who all work together to provide you with the care you need, when you need it.  We recommend signing up for the patient portal called "MyChart".  Sign up information is provided on this After Visit Summary.  MyChart is used to connect with patients for Virtual Visits (Telemedicine).  Patients are able to view lab/test results, encounter notes, upcoming appointments, etc.  Non-urgent messages can be sent to your provider as well.   To learn more about what you can do with MyChart, go to [email or web address].    Your next appointment:   6 month(s)  The format for your next appointment:   In Person  Provider:   Carlyle Dolly, MD or Bernerd Pho, PA-C   Other Instructions None    Signed, Erma Heritage, PA-C  03/07/2021 7:23 PM    [place] [address] [place], [place] [address] Phone: [phone] Fax: [phone]

## 2021-03-07 ENCOUNTER — Other Ambulatory Visit: Payer: Self-pay

## 2021-03-07 ENCOUNTER — Encounter: Payer: Self-pay | Admitting: Student

## 2021-03-07 ENCOUNTER — Ambulatory Visit (INDEPENDENT_AMBULATORY_CARE_PROVIDER_SITE_OTHER): Payer: Medicare Other | Admitting: Student

## 2021-03-07 VITALS — BP 128/62 | HR 66 | Ht 63.0 in | Wt 103.0 lb

## 2021-03-07 DIAGNOSIS — I480 Paroxysmal atrial fibrillation: Secondary | ICD-10-CM

## 2021-03-07 DIAGNOSIS — E785 Hyperlipidemia, unspecified: Secondary | ICD-10-CM

## 2021-03-07 DIAGNOSIS — I951 Orthostatic hypotension: Secondary | ICD-10-CM | POA: Diagnosis not present

## 2021-03-07 DIAGNOSIS — R002 Palpitations: Secondary | ICD-10-CM

## 2021-03-07 MED ORDER — APIXABAN 5 MG PO TABS: 5.0000 mg | ORAL_TABLET | Freq: Two times a day (BID) | ORAL | 11 refills | Status: DC

## 2021-03-07 MED ORDER — MIDODRINE HCL 5 MG PO TABS
5.0000 mg | ORAL_TABLET | Freq: Two times a day (BID) | ORAL | 3 refills | Status: DC
Start: 2021-03-07 — End: 2021-06-26

## 2021-03-07 MED ORDER — ROSUVASTATIN CALCIUM 40 MG PO TABS: 40.0000 mg | ORAL_TABLET | Freq: Every day | ORAL | 3 refills | Status: DC

## 2021-03-07 NOTE — Patient Instructions (Signed)
Medication Instructions:   DECREASE Midodrine to twice a day. If you do well with that decrease, you can the stop it.  *If you need a refill on your cardiac medications before your next appointment, please call your pharmacy*   Lab Work: None today  If you have labs (blood work) drawn today and your tests are completely normal, you will receive your results only by: [place] (if you have MyChart) OR A paper copy in the mail If you have any lab test that is abnormal or we need to change your treatment, we will call you to review the results.   Testing/Procedures: None today    Follow-Up: At [facility], you and your health needs are our priority.  As part of our continuing mission to provide you with exceptional heart care, we have created designated Provider Care Teams.  These Care Teams include your primary Cardiologist (physician) and Advanced Practice Providers (APPs -  Physician Assistants and Nurse Practitioners) who all work together to provide you with the care you need, when you need it.  We recommend signing up for the patient portal called "MyChart".  Sign up information is provided on this After Visit Summary.  MyChart is used to connect with patients for Virtual Visits (Telemedicine).  Patients are able to view lab/test results, encounter notes, upcoming appointments, etc.  Non-urgent messages can be sent to your provider as well.   To learn more about what you can do with MyChart, go to [email or web address].    Your next appointment:   6 month(s)  The format for your next appointment:   In Person  Provider:   Carlyle Dolly, MD or Bernerd Pho, PA-C   Other Instructions None

## 2021-03-12 ENCOUNTER — Other Ambulatory Visit (INDEPENDENT_AMBULATORY_CARE_PROVIDER_SITE_OTHER): Payer: Medicare Other

## 2021-03-12 ENCOUNTER — Other Ambulatory Visit: Payer: Self-pay

## 2021-03-12 DIAGNOSIS — Z23 Encounter for immunization: Secondary | ICD-10-CM | POA: Diagnosis not present

## 2021-03-24 ENCOUNTER — Other Ambulatory Visit: Payer: Self-pay

## 2021-03-24 ENCOUNTER — Ambulatory Visit (INDEPENDENT_AMBULATORY_CARE_PROVIDER_SITE_OTHER): Payer: Medicare Other | Admitting: Family Medicine

## 2021-03-24 VITALS — BP 110/67 | HR 66 | Temp 97.6°F | Ht 63.0 in | Wt 103.0 lb

## 2021-03-24 DIAGNOSIS — M8580 Other specified disorders of bone density and structure, unspecified site: Secondary | ICD-10-CM

## 2021-03-24 DIAGNOSIS — Z5181 Encounter for therapeutic drug level monitoring: Secondary | ICD-10-CM

## 2021-03-24 DIAGNOSIS — E7849 Other hyperlipidemia: Secondary | ICD-10-CM

## 2021-03-24 DIAGNOSIS — Z79891 Long term (current) use of opiate analgesic: Secondary | ICD-10-CM

## 2021-03-24 DIAGNOSIS — M5431 Sciatica, right side: Secondary | ICD-10-CM

## 2021-03-24 MED ORDER — ALENDRONATE SODIUM 70 MG PO TABS: 70.0000 mg | ORAL_TABLET | ORAL | 11 refills | Status: DC

## 2021-03-24 NOTE — Patient Instructions (Signed)
Alendronate Solution What is this medication? ALENDRONATE (a LEN droe nate) treats osteoporosis. It works by Paramedic stronger and less likely to break (fracture). It belongs to a group of medications called bisphosphonates. This medicine may be used for other purposes; ask your health care provider or pharmacist if you have questions. COMMON BRAND NAME(S): Fosamax What should I tell my care team before I take this medication? They need to know if you have any of these conditions: Bleeding disorder Cancer Dental disease Difficulty swallowing Infection (fever, chills, cough, sore throat, pain or trouble passing urine) Kidney disease Low levels of calcium or other minerals in the blood Low red blood cell counts Receiving steroids like dexamethasone or prednisone Stomach or intestine problems Trouble sitting or standing for 30 minutes An unusual or allergic reaction to alendronate, other medications, foods, dyes or preservatives Pregnant or trying to get pregnant Breast-feeding How should I use this medication? Take this medication by mouth with a full glass of water. Take it as directed on the prescription label at the same time every day. Use a specially marked oral syringe, spoon, or dropper to measure each dose. Ask your pharmacist if you do not have one. Household spoons are not accurate. Take the dose right after waking up. Do not eat or drink anything before taking it. Do not take it with any other drink except water. After taking it, do not eat breakfast, drink, or take any other medications or vitamins for at least 30 minutes. Sit or stand up for at least 30 minutes after you take it. Do not lie down. Keep taking it unless your care team tells you to stop. A special MedGuide will be given to you by the pharmacist with each prescription and refill. Be sure to read this information carefully each time. Talk to your care team about the use of this medication in children. Special  care may be needed. Overdosage: If you think you have taken too much of this medicine contact a poison control center or emergency room at once. NOTE: This medicine is only for you. Do not share this medicine with others. What if I miss a dose? If you take your medication once a day, skip it. Take your next dose at the scheduled time the next morning. Do not take two doses on the same day. If you take your medication once a week, take the missed dose on the morning after you remember. Do not take two doses on the same day. What may interact with this medication? Aluminum hydroxide Antacids Aspirin Calcium supplements Iron supplements Magnesium supplements Medications for inflammation like ibuprofen, naproxen, and others Vitamins with minerals This list may not describe all possible interactions. Give your health care provider a list of all the medicines, herbs, non-prescription drugs, or dietary supplements you use. Also tell them if you smoke, drink alcohol, or use illegal drugs. Some items may interact with your medicine. What should I watch for while using this medication? Visit your care team for regular checks on your progress. It may be some time before you see the benefit from this medication. Some people who take this medication have severe bone, joint, or muscle pain. This medication may also increase your risk for jaw problems or a broken thigh bone. Tell your care team right away if you have severe pain in your jaw, bones, joints, or muscles. Tell you care team if you have any pain that does not go away or that gets worse. Tell your dentist  and dental surgeon that you are taking this medication. You should not have major dental surgery while on this medication. See your dentist to have a dental exam and fix any dental problems before starting this medication. Take good care of your teeth while on this medication. Make sure you see your dentist for regular follow-up appointments. You  should make sure you get enough calcium and vitamin D while you are taking this medication. Discuss the foods you eat and the vitamins you take with your care team. You may need blood work done while you are taking this medication. What side effects may I notice from receiving this medication? Side effects that you should report to your care team as soon as possible: Allergic reactions--skin rash, itching, hives, swelling of the face, lips, tongue, or throat Low calcium level--muscle pain or cramps, confusion, tingling, or numbness in the hands or feet Osteonecrosis of the jaw--pain, swelling, or redness in the mouth, numbness of the jaw, poor healing after dental work, unusual discharge from the mouth, visible bones in the mouth Pain or trouble swallowing Severe bone, joint, or muscle pain Stomach bleeding--bloody or black, tar-like stools, vomiting blood or brown material that looks like coffee grounds Side effects that usually do not require medical attention (report to your care team if they continue or are bothersome): Constipation Diarrhea Nausea Stomach pain This list may not describe all possible side effects. Call your doctor for medical advice about side effects. You may report side effects to FDA at 1-800-FDA-1088. Where should I keep my medication? Keep out of the reach of children and pets. Store at room temperature between 20 and 25 degrees C (68 and 77 degrees F). Do not freeze. Throw away any unused medication after the expiration date. NOTE: This sheet is a summary. It may not cover all possible information. If you have questions about this medicine, talk to your doctor, pharmacist, or health care provider.  2022 Elsevier/Gold Standard (2020-04-25 00:00:00)

## 2021-03-24 NOTE — Progress Notes (Signed)
   Subjective:    Patient ID: [name], female    DOB: 1940/06/29, 80 y.o.   MRN: [id number]  HPI This patient was seen today for chronic pain  The medication list was reviewed and updated.  Location of Pain for which the patient has been treated with regarding narcotics: back pain  Onset of this pain: chronic   -Compliance with medication: dep  - Number patient states they take daily:   -when was the last dose patient took? 12 noon  The patient was advised the importance of maintaining medication and not using illegal substances with these.  Here for refills and follow up  The patient was educated that we can provide 3 monthly scripts for their medication, it is their responsibility to follow the instructions.  Side effects or complications from medications: no  Patient is aware that pain medications are meant to minimize the severity of the pain to allow their pain levels to improve to allow for better function. They are aware of that pain medications cannot totally remove their pain.  Due for UDT ( at least once per year) : 03/2020  Scale of 1 to 10 ( 1 is least 10 is most) Your pain level without the medicine: 10 + Your pain level with medication 6 t 7   Scale 1 to 10 ( 1-helps very little, 10 helps very well) How well does your pain medication reduce your pain so you can function better through out the day? 6  Quality of the pain: sharp, burning, stinging  Persistence of the pain: 24 /7   Modifying factors: no  We did discuss bone density testing It did show osteopenia She has elevated FRAX score Puts her at risk of a hip fracture Patient is willing to go ahead and be on medications Rationale and side effects discussed       Review of Systems     Objective:   Physical Exam General-in no acute distress Eyes-no discharge Lungs-respiratory rate normal, CTA CV-no murmurs,RRR Extremities skin warm dry no edema Neuro grossly normal Behavior normal,  alert        Assessment & Plan:  1. Osteopenia, unspecified location Elevated FRAX score Fosamax once weekly Side effects discussed Patient agrees to try Previous FRAX score normal  2. Sciatica of right side Significant sciatica right side.  Stretching recommended.  Patient is already seen back specialist in the past future surgery really not beneficial unless worse  3. Other hyperlipidemia Followed by cardiology takes her medicine tolerates it well  4. Encounter for monitoring opioid maintenance therapy The patient was seen in followup for chronic pain. A review over at their current pain status was discussed. Drug registry was checked. Prescriptions were given.  Regular follow-up recommended. Discussion was held regarding the importance of compliance with medication as well as pain medication contract.  Patient was informed that medication may cause drowsiness and should not be combined  with other medications/alcohol or street drugs. If the patient feels medication is causing altered alertness then do not drive or operate dangerous equipment. Drug registry checked 3 prescriptions written

## 2021-03-26 MED ORDER — HYDROCODONE-ACETAMINOPHEN 10-325 MG PO TABS: 1.0000 | ORAL_TABLET | Freq: Three times a day (TID) | ORAL | 0 refills | Status: AC | PRN

## 2021-03-26 MED ORDER — HYDROCODONE-ACETAMINOPHEN 10-325 MG PO TABS: 1.0000 | ORAL_TABLET | Freq: Three times a day (TID) | ORAL | 0 refills | Status: DC | PRN

## 2021-03-28 ENCOUNTER — Ambulatory Visit: Payer: Medicare Other | Admitting: Family Medicine

## 2021-04-15 ENCOUNTER — Telehealth: Payer: Self-pay | Admitting: Family Medicine

## 2021-04-15 NOTE — Telephone Encounter (Signed)
Left message for patient to call back and schedule Medicare Annual Wellness Visit (AWV) in office.   If unable to come into the office for AWV,  please offer to do virtually or by telephone.  Last AWV: 08/21/2019  Please schedule at anytime with [facility].  40 minute appointment  Any questions, please contact me at [phone]

## 2021-05-26 ENCOUNTER — Encounter: Payer: Self-pay | Admitting: Family Medicine

## 2021-05-26 ENCOUNTER — Ambulatory Visit (INDEPENDENT_AMBULATORY_CARE_PROVIDER_SITE_OTHER): Payer: Medicare Other | Admitting: Family Medicine

## 2021-05-26 ENCOUNTER — Telehealth: Payer: Self-pay

## 2021-05-26 ENCOUNTER — Other Ambulatory Visit: Payer: Self-pay

## 2021-05-26 ENCOUNTER — Ambulatory Visit (HOSPITAL_COMMUNITY)
Admission: RE | Admit: 2021-05-26 | Discharge: 2021-05-26 | Disposition: A | Discharge status: Home / Self Care | Payer: Medicare Other | Source: Ambulatory Visit | Attending: Family Medicine | Admitting: Family Medicine

## 2021-05-26 VITALS — BP 127/70 | HR 96 | Temp 98.0°F | Ht 63.0 in | Wt 103.0 lb

## 2021-05-26 DIAGNOSIS — M25512 Pain in left shoulder: Secondary | ICD-10-CM

## 2021-05-26 DIAGNOSIS — M79605 Pain in left leg: Secondary | ICD-10-CM

## 2021-05-26 DIAGNOSIS — M25552 Pain in left hip: Secondary | ICD-10-CM

## 2021-05-26 DIAGNOSIS — M79652 Pain in left thigh: Secondary | ICD-10-CM | POA: Diagnosis not present

## 2021-05-26 DIAGNOSIS — S42252A Displaced fracture of greater tuberosity of left humerus, initial encounter for closed fracture: Secondary | ICD-10-CM | POA: Diagnosis not present

## 2021-05-26 DIAGNOSIS — M898X1 Other specified disorders of bone, shoulder: Secondary | ICD-10-CM

## 2021-05-26 DIAGNOSIS — S40022A Contusion of left upper arm, initial encounter: Secondary | ICD-10-CM

## 2021-05-26 NOTE — Progress Notes (Signed)
° ° °  Subjective:    Patient ID: [name], female    DOB: [birth year], 81 y.o.   MRN: [id number]  HPI Fall down 4 to 5 steps , missed last one and fell on left side  Has pain from L side neck , shoulder , and hip  She states has been very difficult getting along over the past few days with all of this Patient took a fall landing on the left side of her body complains of some shoulder pain hip pain leg pain denies any loss of consciousness did not sustain hit to the head Review of Systems     Objective:   Physical Exam  Very tender left hip as well as left shoulder region limited range of motion of the left arm pulses are normal skin warm dry Patient very tender in the left upper shoulder region with no obvious swelling     Assessment & Plan:  X-ray shows comminuted fracture of the humerus Referral to orthopedic surgeon Will have patient use sling hopefully can be seen this week Need to rule out fracture Therefore the x-rays were completed After the x-rays were received patient was notified and referral made X-rays everywhere else was negative Contusions More than likely will bruise

## 2021-05-26 NOTE — Telephone Encounter (Signed)
Spoke with patient and informed her of her x ray results , informed of her humerus fracture . She does not have a preference to a specific orthopedic , will fax order for sling to [employer]

## 2021-05-27 ENCOUNTER — Other Ambulatory Visit: Payer: Self-pay

## 2021-05-27 ENCOUNTER — Encounter: Payer: Self-pay | Admitting: Orthopaedic Surgery

## 2021-05-27 ENCOUNTER — Ambulatory Visit: Payer: Medicare Other | Admitting: Orthopaedic Surgery

## 2021-05-27 DIAGNOSIS — S42255A Nondisplaced fracture of greater tuberosity of left humerus, initial encounter for closed fracture: Secondary | ICD-10-CM | POA: Diagnosis not present

## 2021-05-27 DIAGNOSIS — S40022A Contusion of left upper arm, initial encounter: Secondary | ICD-10-CM

## 2021-05-27 DIAGNOSIS — W109XXA Fall (on) (from) unspecified stairs and steps, initial encounter: Secondary | ICD-10-CM

## 2021-05-27 DIAGNOSIS — G894 Chronic pain syndrome: Secondary | ICD-10-CM

## 2021-05-27 NOTE — Progress Notes (Signed)
Subjective:    Patient ID: [name], female    DOB: 22-Mar-1941, 81 y.o.   MRN: [id number]  HPI She fell after missing the last step in her house yesterday and hurt her left shoulder.  She was seen in the ER.  X-rays showed: IMPRESSION: 1. Mildly displaced comminuted fracture of the greater tuberosity of the left humerus.   2.  Moderate acromioclavicular osteoarthritis.  She was placed in a sling.  She has pain.  She has no other injury.  She has chronic pain syndrome and is already on significant pain medicine.  She tried to lay down but the pain will not let her.  She has no numbness.  I have reviewed the ER record.  I have independently reviewed and interpreted x-rays of this patient done at another site by another physician or qualified health professional.    Review of Systems  Constitutional:  Positive for activity change.  HENT:  Positive for hearing loss.   Cardiovascular:  Positive for palpitations.  Musculoskeletal:  Positive for arthralgias, joint swelling and myalgias.  All other systems reviewed and are negative. For Review of Systems, all other systems reviewed and are negative.  The following is a summary of the past history medically, past history surgically, known current medicines, social history and family history.  This information is gathered electronically by the computer from prior information and documentation.  I review this each visit and have found including this information at this point in the chart is beneficial and informative.   Past Medical History:  Diagnosis Date   Adenocarcinoma, colon ([place]) 03/2005   Stage I; resected in 03/2005   Arthritis    Colon cancer ([place])    Removed surgicaly    Gastroesophageal reflux disease    Gastroparesis    Hearing loss    mild   Hyperlipidemia    Irregular heartbeat    Ovarian cyst 2008   Paroxysmal atrial fibrillation ([facility])    Palpitations; maintained on flecainide; -normal coronary angiography  and 1999; negative stress nuclear study in 11/2005   PONV (postoperative nausea and vomiting)    Tobacco abuse, in remission    10-pack-years; quit in 2001    Past Surgical History:  Procedure Laterality Date   [place]     BIOPSY  10/18/2017   Procedure: BIOPSY;  Surgeon: [name], MD;  [facility];  Service: Endoscopy;;  gastric   BIOPSY  07/06/2018   Procedure: BIOPSY;  Surgeon: [name], MD;  [facility];  Service: Endoscopy;;  gastric   CHOLECYSTECTOMY     COLON SURGERY  2006   Colon cancer   COLONOSCOPY  03/25/2012   [name], MD   COLONOSCOPY N/A 04/26/2017   Procedure: COLONOSCOPY;  Surgeon: [name], MD;  [facility];  Service: Endoscopy;  Laterality: N/A;  2:00   COLONOSCOPY N/A 07/06/2018   Procedure: COLONOSCOPY;  Surgeon: [name], MD;  [facility];  Service: Endoscopy;  Laterality: N/A;  10:30   COSMETIC SURGERY  09/2007   ESOPHAGOGASTRODUODENOSCOPY N/A 10/18/2017   Procedure: ESOPHAGOGASTRODUODENOSCOPY (EGD);  Surgeon: [name], MD;  [facility];  Service: Endoscopy;  Laterality: N/A;   ESOPHAGOGASTRODUODENOSCOPY N/A 07/06/2018   Procedure: ESOPHAGOGASTRODUODENOSCOPY (EGD);  Surgeon: [name], MD;  [facility];  Service: Endoscopy;  Laterality: N/A;   GIVENS CAPSULE STUDY N/A 12/26/2019   Procedure:  GIVENS CAPSULE STUDY;  Surgeon: [name], MD;  [facility];  Service: Endoscopy;  Laterality: N/A;  730   PARTIAL COLECTOMY  2006   adenocarcinoma   POLYPECTOMY  04/26/2017   Procedure: POLYPECTOMY;  Surgeon: [name], MD;  [facility];  Service: Endoscopy;;  colon   POLYPECTOMY  07/06/2018   Procedure: POLYPECTOMY;  Surgeon: [name], MD;  [facility];  Service: Endoscopy;;  colon    TONSILLECTOMY      Current Outpatient Medications on File Prior to Visit   Medication Sig Dispense Refill   alendronate (FOSAMAX) 70 MG tablet Take 1 tablet (70 mg total) by mouth every 7 (seven) days. Take with a full glass of water on an empty stomach. 4 tablet 11   apixaban (ELIQUIS) 5 MG TABS tablet Take 1 tablet (5 mg total) by mouth 2 (two) times daily. 60 tablet 11   Calcium Carbonate (CALCIUM 600 PO) Take by mouth.     Ferrous Sulfate (IRON PO) Take by mouth. 1 tab once a day     flecainide (TAMBOCOR) 100 MG tablet TAKE 1 TABLET BY MOUTH EVERY 12 HOURS 60 tablet 3   HYDROcodone-acetaminophen (NORCO) 10-325 MG tablet Take 1 tablet by mouth 3 (three) times daily as needed. 90 tablet 0   HYDROcodone-acetaminophen (NORCO) 10-325 MG tablet Take 1 tablet by mouth 3 (three) times daily as needed. 90 tablet 0   lactulose (CHRONULAC) 10 GM/15ML solution Take 20 g by mouth at bedtime.     midodrine (PROAMATINE) 5 MG tablet Take 1 tablet (5 mg total) by mouth 2 (two) times daily with a meal. 60 tablet 3   omeprazole (PRILOSEC) 20 MG capsule Take 20 mg by mouth daily before breakfast.      rosuvastatin (CRESTOR) 40 MG tablet Take 1 tablet (40 mg total) by mouth daily. 90 tablet 3   vitamin B-12 (CYANOCOBALAMIN) 1000 MCG tablet Take 1,000 mcg by mouth daily.     No current facility-administered medications on file prior to visit.    Social History   Socioeconomic History   Marital status: Married    Spouse name: Not on file   Number of children: Not on file   Years of education: Not on file   Highest education level: Not on file  Occupational History   Occupation: Bookkeeper  Tobacco Use   Smoking status: Former    Packs/day: 0.30    Years: 40.00    Pack years: 12.00    Types: Cigarettes    Start date: 02/23/1963    Quit date: 03/25/2002    Years since quitting: 19.1   Smokeless tobacco: Never   Tobacco comments:    Quit smoking in 2001  Vaping Use   Vaping Use: Never used  Substance and Sexual Activity   Alcohol use: No    Alcohol/week: 0.0 standard  drinks   Drug use: No   Sexual activity: Yes    Birth control/protection: Surgical  Other Topics Concern   Not on file  Social History Narrative   Lives with husband in a one story home.  Has 3 children.  Retired from Medical sales representative work.  Education: some college.   Social Determinants of Health   Financial Resource Strain: Not on file  Food Insecurity: Not on file  Transportation Needs: Not on file  Physical Activity: Not on file  Stress: Not on file  Social Connections: Not on file  Intimate Partner Violence: Not on file  Family History  Problem Relation Age of Onset   Heart disease Mother    Stroke Mother    Heart attack Mother    Cancer Father        lung   Heart disease Father    Hypertension Father    Cancer Brother        rectal   Healthy Son    Heart failure Sister    Stroke Sister     There were no vitals taken for this visit.  There is no height or weight on file to calculate BMI.     Objective:   Physical Exam Vitals and nursing note reviewed. Exam conducted with a chaperone present.  Constitutional:      Appearance: She is well-developed.  HENT:     Head: Normocephalic and atraumatic.  Eyes:     Conjunctiva/sclera: Conjunctivae normal.     Pupils: Pupils are equal, round, and reactive to light.  Cardiovascular:     Rate and Rhythm: Normal rate and regular rhythm.  Pulmonary:     Effort: Pulmonary effort is normal.  Abdominal:     Palpations: Abdomen is soft.  Musculoskeletal:       Arms:     Cervical back: Normal range of motion and neck supple.  Skin:    General: Skin is warm and dry.  Neurological:     Mental Status: She is alert and oriented to person, place, and time.     Cranial Nerves: No cranial nerve deficit.     Motor: No abnormal muscle tone.     Coordination: Coordination normal.     Deep Tendon Reflexes: Reflexes are normal and symmetric. Reflexes normal.  Psychiatric:        Behavior: Behavior normal.        Thought Content:  Thought content normal.        Judgment: Judgment normal.          Assessment & Plan:   Encounter Diagnoses  Name Primary?   Nondisplaced fracture of greater tuberosity of left humerus, initial encounter for closed fracture Yes   Chronic pain syndrome    I have explained the findings to her.  She has seen the X-rays.  The fracture is nondisplaced.  She needs to use ice and continue her pain medicine.  She needs to use recliner and sleep semi-recumbent.  Return here in one week.  X-rays then of the left shoulder.  If she notices other areas that hurt, let us know or go to the ER.  Call if any problem.  Precautions discussed.  Electronically Signed Sanjuana Kava, MD 1/17/20233:21 PM

## 2021-05-27 NOTE — Telephone Encounter (Addendum)
FYI: Patient stated he had preferred to see Dr Gladstone Lighter but Dr Brooke Bonito office called her this morning and told her they could see her today and she saw Dr Luna Glasgow this afternoon.

## 2021-05-27 NOTE — Telephone Encounter (Signed)
Recommend [facility] May utilize [facility], [facility], [place], or [facility], needs urgent referral this week.  Very important.  If you need my assistance in getting this completed please let me know-also give me follow-up regarding when her appointment is thank you and with whom

## 2021-05-27 NOTE — Patient Instructions (Addendum)
Use ice  Wear sling Sleep upright in a recliner  if you lie flat it will increase the pain Take your hydrocodone for pain  It will get better in the next few days  Move your fingers often to decrease swelling Avoid sudden movements You will notice bruising into your arm/ armpit and sometimes into the chest or even into the elbow

## 2021-05-27 NOTE — Telephone Encounter (Signed)
That is fine I am glad she was able to be seen-in times past it has not always been easy getting in locally same day-just saying

## 2021-05-27 NOTE — Telephone Encounter (Signed)
Per referral coordinator: [name] has an Appt tomorrow with Dr. Gladstone Lighter at their [facility]

## 2021-06-03 ENCOUNTER — Encounter: Payer: Self-pay | Admitting: Orthopaedic Surgery

## 2021-06-03 ENCOUNTER — Ambulatory Visit: Payer: Medicare Other

## 2021-06-03 ENCOUNTER — Other Ambulatory Visit: Payer: Self-pay

## 2021-06-03 ENCOUNTER — Ambulatory Visit: Payer: Medicare Other | Admitting: Orthopaedic Surgery

## 2021-06-03 ENCOUNTER — Ambulatory Visit (INDEPENDENT_AMBULATORY_CARE_PROVIDER_SITE_OTHER): Payer: Medicare Other | Admitting: Orthopaedic Surgery

## 2021-06-03 DIAGNOSIS — S42255D Nondisplaced fracture of greater tuberosity of left humerus, subsequent encounter for fracture with routine healing: Secondary | ICD-10-CM

## 2021-06-03 DIAGNOSIS — M25552 Pain in left hip: Secondary | ICD-10-CM

## 2021-06-03 NOTE — Progress Notes (Signed)
My shoulder and hip hurt.  She complains of pain of the left lateral hip.  I got X-rays which were negative.  I wanted to make sure a fracture was not missed.  She has contusion of the hip.  The left shoulder is tender.  She has resolving contusions.  NV intact.  Encounter Diagnoses  Name Primary?   Nondisplaced fracture of greater tuberosity of left humerus, subsequent encounter for fracture with routine healing Yes   Pain in left hip    X-rays done of the left shoulder and hip, reported separately.  Return in two weeks, continue sling.  Call if any problem.  Precautions discussed.  X-rays on return of shoulder on the left.  Electronically Signed Sanjuana Kava, MD 1/24/20232:53 PM

## 2021-06-09 ENCOUNTER — Encounter (INDEPENDENT_AMBULATORY_CARE_PROVIDER_SITE_OTHER): Payer: Self-pay | Admitting: *Deleted

## 2021-06-17 ENCOUNTER — Ambulatory Visit (INDEPENDENT_AMBULATORY_CARE_PROVIDER_SITE_OTHER): Payer: Medicare Other | Admitting: Orthopaedic Surgery

## 2021-06-17 ENCOUNTER — Other Ambulatory Visit: Payer: Self-pay

## 2021-06-17 ENCOUNTER — Ambulatory Visit: Payer: Medicare Other

## 2021-06-17 ENCOUNTER — Encounter: Payer: Self-pay | Admitting: Orthopaedic Surgery

## 2021-06-17 DIAGNOSIS — S42255D Nondisplaced fracture of greater tuberosity of left humerus, subsequent encounter for fracture with routine healing: Secondary | ICD-10-CM

## 2021-06-17 NOTE — Progress Notes (Signed)
My shoulder is tender  She has been in sling since injury.  She has no new trauma.  X-rays were done of the left shoulder, reported separately.  Her fracture is healing nicely.  Come out of the sling, begin OT.  Encounter Diagnosis  Name Primary?   Nondisplaced fracture of greater tuberosity of left humerus, subsequent encounter for fracture with routine healing Yes   Return in three weeks.  Call if any problem.  Precautions discussed.  X-rays on return.  Electronically Signed Sanjuana Kava, MD 2/7/20232:39 PM

## 2021-06-25 ENCOUNTER — Ambulatory Visit (INDEPENDENT_AMBULATORY_CARE_PROVIDER_SITE_OTHER): Payer: Medicare Other | Admitting: Family Medicine

## 2021-06-25 ENCOUNTER — Other Ambulatory Visit: Payer: Self-pay

## 2021-06-25 VITALS — BP 106/64

## 2021-06-25 DIAGNOSIS — D509 Iron deficiency anemia, unspecified: Secondary | ICD-10-CM

## 2021-06-25 DIAGNOSIS — E7849 Other hyperlipidemia: Secondary | ICD-10-CM

## 2021-06-25 DIAGNOSIS — Z79899 Other long term (current) drug therapy: Secondary | ICD-10-CM | POA: Diagnosis not present

## 2021-06-25 DIAGNOSIS — I10 Essential (primary) hypertension: Secondary | ICD-10-CM | POA: Diagnosis not present

## 2021-06-25 MED ORDER — HYDROCODONE-ACETAMINOPHEN 10-325 MG PO TABS: 1.0000 | ORAL_TABLET | Freq: Three times a day (TID) | ORAL | 0 refills | Status: DC | PRN

## 2021-06-25 MED ORDER — HYDROCODONE-ACETAMINOPHEN 10-325 MG PO TABS: ORAL_TABLET | ORAL | 0 refills | Status: DC

## 2021-06-25 NOTE — Progress Notes (Signed)
Subjective:    Patient ID: [name], female    DOB: 07-12-1940, 81 y.o.   MRN: [id number]  HPI This patient was seen today for chronic pain  The medication list was reviewed and updated.  Location of Pain for which the patient has been treated with regarding narcotics: Mid back pain  Onset of this pain: Present for years   -Compliance with medication: Good  - Number patient states they take daily: Approximately 3/day she has been on this for years her back surgeon had her on it and then we took over prescribing this several years ago  -when was the last dose patient took?  Earlier today  The patient was advised the importance of maintaining medication and not using illegal substances with these.  Here for refills and follow up  The patient was educated that we can provide 3 monthly scripts for their medication, it is their responsibility to follow the instructions.  Side effects or complications from medications: She denies side effects  Patient is aware that pain medications are meant to minimize the severity of the pain to allow their pain levels to improve to allow for better function. They are aware of that pain medications cannot totally remove their pain.  Due for UDT ( at least once per year) : Due on next visit  Scale of 1 to 10 ( 1 is least 10 is most) Your pain level without the medicine: 8 Your pain level with medication 3  Scale 1 to 10 ( 1-helps very little, 10 helps very well) How well does your pain medication reduce your pain so you can function better through out the day? 7  Quality of the pain: Burning throbbing aching  Persistence of the pain: Present all the time  Modifying factors: Worse with activity  She states that the pain medicine does take enough of the pain away to where she is more functional and able to do more things        Review of Systems     Objective:   Physical Exam  General-in no acute distress Eyes-no  discharge Lungs-respiratory rate normal, CTA CV-no murmurs,RRR Extremities skin warm dry no edema Neuro grossly normal Behavior normal, alert       Assessment & Plan:  1. Other hyperlipidemia Check cholesterol profile before next visit - Lipid panel  2. Hypertension, unspecified type Blood pressure on the low end.  Will send note to cardiology.  May need to bump up her medication to 3 times daily - CBC with Differential/Platelet - Basic metabolic panel - Hepatic function panel  3. Iron deficiency anemia, unspecified iron deficiency anemia type Follow-up on CBC to make sure she is not losing, also patient on anticoagulants Eliquis - CBC with Differential/Platelet  4. High risk medication use Check labs before next visit - CBC with Differential/Platelet - Basic metabolic panel - Hepatic function panel  Chronic back pain pain medication no more than 3 times per day we will send in 3 scripts  The patient was seen in followup for chronic pain. A review over at their current pain status was discussed. Drug registry was checked. Prescriptions were given.  Regular follow-up recommended. Discussion was held regarding the importance of compliance with medication as well as pain medication contract.  Patient was informed that medication may cause drowsiness and should not be combined  with other medications/alcohol or street drugs. If the patient feels medication is causing altered alertness then do not drive or operate dangerous equipment.  Should be noted that the patient appears to be meeting appropriate use of opioids and response.  Evidenced by improved function and decent pain control without significant side effects and no evidence of overt aberrancy issues.  Upon discussion with the patient today they understand that opioid therapy is optional and they feel that the pain has been refractory to reasonable conservative measures and is significant and affecting quality of life  enough to warrant ongoing therapy and wishes to continue opioids.  Refills were provided.  Patient to follow-up in 3 months

## 2021-06-26 ENCOUNTER — Telehealth: Payer: Self-pay | Admitting: Student

## 2021-06-26 ENCOUNTER — Ambulatory Visit: Payer: Medicare Other | Admitting: Family Medicine

## 2021-06-26 ENCOUNTER — Other Ambulatory Visit: Payer: Self-pay | Admitting: Student

## 2021-06-26 ENCOUNTER — Telehealth: Payer: Self-pay | Admitting: Cardiology

## 2021-06-26 MED ORDER — MIDODRINE HCL 5 MG PO TABS: 5.0000 mg | ORAL_TABLET | Freq: Three times a day (TID) | ORAL | 2 refills | Status: DC

## 2021-06-26 NOTE — Telephone Encounter (Signed)
Pt c/o medication issue:  1. Name of Medication: midodrine (PROAMATINE) 5 MG tablet  2. How are you currently taking this medication (dosage and times per day)? Take 1 tablet (5 mg total) by mouth 2(two) times daily with meals  3. Are you having a reaction (difficulty breathing--STAT)? no  4. What is your medication issue? Pt says taking medicine twice a day instead of three times is not working for her... please advise

## 2021-06-26 NOTE — Telephone Encounter (Signed)
° °  Made aware by the patient's PCP she has been having episodes of hypotension while on Midodrine BID dosing and it was recommended she go back up to TID dosing. Will send in updated Rx.   Kisha or Merom, can you please make the patient aware that taking Midodrine as TID is fine and to make [place] aware if she starts to have episodes of elevated BP.   Thanks,  [place]

## 2021-06-26 NOTE — Telephone Encounter (Signed)
Patient called and another phone note started. I will close. She will call back if blood pressures become elevated.

## 2021-06-26 NOTE — Telephone Encounter (Signed)
See previous phone note, this was handled.

## 2021-06-26 NOTE — Telephone Encounter (Signed)
Attempt to reach patient,no answer,no machine

## 2021-06-27 ENCOUNTER — Ambulatory Visit: Payer: Medicare Other | Admitting: Family Medicine

## 2021-07-02 ENCOUNTER — Encounter (HOSPITAL_COMMUNITY): Payer: Self-pay | Admitting: Occupational Therapy

## 2021-07-02 ENCOUNTER — Other Ambulatory Visit: Payer: Self-pay

## 2021-07-02 ENCOUNTER — Ambulatory Visit (HOSPITAL_COMMUNITY): Payer: Medicare Other | Attending: Orthopaedic Surgery | Admitting: Occupational Therapy

## 2021-07-02 DIAGNOSIS — R29898 Other symptoms and signs involving the musculoskeletal system: Secondary | ICD-10-CM | POA: Insufficient documentation

## 2021-07-02 DIAGNOSIS — M25612 Stiffness of left shoulder, not elsewhere classified: Secondary | ICD-10-CM | POA: Diagnosis not present

## 2021-07-02 DIAGNOSIS — M25512 Pain in left shoulder: Secondary | ICD-10-CM | POA: Insufficient documentation

## 2021-07-02 NOTE — Patient Instructions (Signed)
1) SHOULDER: Flexion On Table   Place hands on towel placed on table, elbows straight. Lean forward with you upper body, pushing towel away from body.  __10_ reps per set, _2-3__ sets per day  2) Abduction (Passive)   With arm out to side, resting on towel placed on table with palm DOWN, keeping trunk away from table, lean to the side while pushing towel away from body.  Repeat _10___ times. Do _2-3___ sessions per day.  Copyright  VHI. All rights reserved.     3) Internal Rotation (Assistive)   Seated with elbow bent at right angle and held against side, slide arm on table surface in an inward arc keeping elbow anchored in place. Repeat __10__ times. Do _2-3___ sessions per day. Activity: Use this motion to brush crumbs off the table.  Copyright  VHI. All rights reserved.

## 2021-07-02 NOTE — Therapy (Signed)
[place], [place], [address] Phone: [phone]   Fax:  [phone]  Occupational Therapy Evaluation  Patient Details  Name: [name] MRN: [id number] Date of Birth: [birth year] Referring Provider (OT): Dr. Sanjuana Kava   Encounter Date: 07/02/2021   OT End of Session - 07/02/21 1505     Visit Number 1    Number of Visits 16    Date for OT Re-Evaluation 08/31/21   mini-reassessment 07/30/2021   Authorization Type [employer] Medicare, $20 copay    Authorization Time Period no visit limit    Progress Note Due on Visit 10    OT Start Time 1432    OT Stop Time 1505    OT Time Calculation (min) 33 min    Activity Tolerance Patient tolerated treatment well    Behavior During Therapy [facility] for tasks assessed/performed             Past Medical History:  Diagnosis Date   Adenocarcinoma, colon ([place]) 03/2005   Stage I; resected in 03/2005   Arthritis    Colon cancer ([place])    Removed surgicaly    Gastroesophageal reflux disease    Gastroparesis    Hearing loss    mild   Hyperlipidemia    Irregular heartbeat    Ovarian cyst 2008   Paroxysmal atrial fibrillation ([facility])    Palpitations; maintained on flecainide; -normal coronary angiography and 1999; negative stress nuclear study in 11/2005   PONV (postoperative nausea and vomiting)    Tobacco abuse, in remission    10-pack-years; quit in 2001    Past Surgical History:  Procedure Laterality Date   [place]  10/18/2017   Procedure: BIOPSY;  Surgeon: [name], MD;  [facility];  Service: Endoscopy;;  gastric   BIOPSY  07/06/2018   Procedure: BIOPSY;  Surgeon: [name], MD;  [facility];  Service: Endoscopy;;  gastric   CHOLECYSTECTOMY     COLON SURGERY  2006   Colon cancer   COLONOSCOPY  03/25/2012   [name], MD   COLONOSCOPY N/A 04/26/2017   Procedure: COLONOSCOPY;   Surgeon: [name], MD;  [facility];  Service: Endoscopy;  Laterality: N/A;  2:00   COLONOSCOPY N/A 07/06/2018   Procedure: COLONOSCOPY;  Surgeon: [name], MD;  [facility];  Service: Endoscopy;  Laterality: N/A;  10:30   COSMETIC SURGERY  09/2007   ESOPHAGOGASTRODUODENOSCOPY N/A 10/18/2017   Procedure: ESOPHAGOGASTRODUODENOSCOPY (EGD);  Surgeon: [name], MD;  [facility];  Service: Endoscopy;  Laterality: N/A;   ESOPHAGOGASTRODUODENOSCOPY N/A 07/06/2018   Procedure: ESOPHAGOGASTRODUODENOSCOPY (EGD);  Surgeon: [name], MD;  [facility];  Service: Endoscopy;  Laterality: N/A;   GIVENS CAPSULE STUDY N/A 12/26/2019   Procedure: GIVENS CAPSULE STUDY;  Surgeon: [name], MD;  [facility];  Service: Endoscopy;  Laterality: N/A;  730   PARTIAL COLECTOMY  2006   adenocarcinoma   POLYPECTOMY  04/26/2017   Procedure: POLYPECTOMY;  Surgeon: [name], MD;  [facility];  Service: Endoscopy;;  colon   POLYPECTOMY  07/06/2018   Procedure: POLYPECTOMY;  Surgeon: [name], MD;  [facility];  Service: Endoscopy;;  colon    TONSILLECTOMY      There were no vitals filed for this visit.  Subjective Assessment - 07/02/21 1454     Subjective  S: Is it fractured or broken?    Pertinent History Pt is a 81 y/o female s/p left humerus fx of greater tuberosity. Pt sustained a fall on 05/26/21 causing this injury, was released from her sling this week. Pt was referred to occupational therapy for evaluation and treatment by Dr. Sanjuana Kava.    Special Tests FOTO: 36/100    Patient Stated Goals To be able to use my arm.    Currently in Pain? Yes    Pain Score 5     Pain Location Shoulder    Pain Orientation Left    Pain Descriptors / Indicators Aching;Sore    Pain Type Acute pain    Pain Radiating Towards neck    Pain Onset More than a month ago    Pain Frequency Constant     Aggravating Factors  movement    Pain Relieving Factors rest    Effect of Pain on Daily Activities mod to max effect on ADLs.    Multiple Pain Sites No               [facility] OT Assessment - 07/02/21 1430       Assessment   Medical Diagnosis s/p left humerus fx of greater tuberosity    Referring Provider (OT) Dr. Sanjuana Kava    Onset Date/Surgical Date 05/26/21    Hand Dominance Right    Next MD Visit 07/08/21    Prior Therapy None      Restrictions   Weight Bearing Restrictions No      Balance Screen   Has the patient fallen in the past 6 months Yes    How many times? 1    Has the patient had a decrease in activity level because of a fear of falling?  No    Is the patient reluctant to leave their home because of a fear of falling?  No      Prior Function   Level of Independence Independent    Vocation Retired    Leisure Walking, shopping      ADL   ADL comments Pt is having difficulty with fixing her hair, dressing, bathing and getting up/down from tub. Pt is having difficulty with housekeeping tasks, cooking tasks. Pt is having difficulty with sleeping in the bed.      Written Expression   Dominant Hand Right      Cognition   Overall Cognitive Status Within Functional Limits for tasks assessed      Observation/Other Assessments   Focus on Therapeutic Outcomes (FOTO)  36/100      ROM / Strength   AROM / PROM / Strength AROM;PROM;Strength      Palpation   Palpation comment mod fascial restrictions along upper arm, trapezius, and scapular regions      AROM   Overall AROM Comments Assessed seated, er/IR adducted    AROM Assessment Site Shoulder    Right/Left Shoulder Left    Left Shoulder Flexion 82 Degrees    Left Shoulder ABduction 66 Degrees    Left Shoulder Internal Rotation 90 Degrees    Left Shoulder External Rotation 50 Degrees      PROM   Overall PROM Comments Assessed supine, er/IR adducted    PROM Assessment Site Shoulder    Right/Left  Shoulder Left    Left Shoulder Flexion 90 Degrees    Left Shoulder ABduction 95 Degrees    Left Shoulder Internal Rotation 90 Degrees  Left Shoulder External Rotation 42 Degrees      Strength   Overall Strength Unable to assess;Due to precautions;Due to pain    Strength Assessment Site Shoulder    Right/Left Shoulder Left                              OT Education - 07/02/21 1454     Education Details table slides    Person(s) Educated Patient    Methods Explanation;Demonstration;Handout    Comprehension Verbalized understanding;Returned demonstration              OT Short Term Goals - 07/02/21 1509       OT SHORT TERM GOAL #1   Title Patient will be educated and independent with HEP to increase mobility required for LUE use.    Time 4    Period Weeks    Status New    Target Date 08/01/21      OT SHORT TERM GOAL #2   Title Pt will increase LUE P/ROM to [facility] to increase ability to perform dressing tasks with minimal compensatory strategies.    Time 4    Period Weeks    Status New      OT SHORT TERM GOAL #3   Title Patient will increase RUE strength to 3/5 to be able to complete reaching tasks from waist to chest height during ADLs.    Time 4    Period Weeks    Status New               OT Long Term Goals - 07/02/21 1511       OT LONG TERM GOAL #1   Title Pt will decrease LUE pain to 3/10 or less to improve ability to sleep in position of comfort for 2+ hours without waking due to pain.    Time 8    Period Weeks    Status New    Target Date 08/31/21      OT LONG TERM GOAL #2   Title Pt will decrease LUE fascial restrictions to min amounts or less to improve ability to perform functional reaching tasks.    Time 8    Period Weeks    Status New      OT LONG TERM GOAL #3   Title Pt will increase LUE A/ROM to [facility] to improve ability to fix her hair using LUE as assist.    Time 8    Period Weeks    Status New      OT LONG TERM  GOAL #4   Title Pt will increase LUE strength to 4+/5 or greater to improve ability to perform cooking tasks.    Time 8    Period Weeks    Status New                   Plan - 07/02/21 1506     Clinical Impression Statement A: Pt is a 81 y/o female s/p fall on 05/26/21 and sustaining a left humerus fx of greater tuberosity. Pt sling was removed this week, she has been trying to incorporate LUE into ADLs however continues to have pain, ROM and strength limitations impacting use of LUE during functional tasks.    OT Occupational Profile and History Problem Focused Assessment - Including review of records relating to presenting problem    Occupational performance deficits (Please refer to evaluation for details): ADL's;IADL's;Rest and Sleep;Leisure    Body  Structure / Function / Physical Skills ADL;Endurance;Muscle spasms;UE functional use;Fascial restriction;Pain;ROM;IADL;Strength    Rehab Potential Good    Clinical Decision Making Limited treatment options, no task modification necessary    Comorbidities Affecting Occupational Performance: None    Modification or Assistance to Complete Evaluation  No modification of tasks or assist necessary to complete eval    OT Frequency 2x / week    OT Duration 8 weeks    OT Treatment/Interventions Self-care/ADL training;Ultrasound;Patient/family education;DME and/or AE instruction;Passive range of motion;Cryotherapy;Electrical Stimulation;Moist Heat;Therapeutic exercise;Manual Therapy;Therapeutic activities    Plan P: Pt will benefit from skilled OT services to decrease pain and fascial restrictions, increase joint ROM, strength, and functional use of LUE for functional tasks. Treatment plan: myofascial release, manual techniques, P/ROM, AA/ROM, A/ROM, general LUE strengthening, scapular strengthening/stability/mobility, modalities prn    OT Home Exercise Plan eval: table slides    Consulted and Agree with Plan of Care Patient              Patient will benefit from skilled therapeutic intervention in order to improve the following deficits and impairments:   Body Structure / Function / Physical Skills: ADL, Endurance, Muscle spasms, UE functional use, Fascial restriction, Pain, ROM, IADL, Strength       Visit Diagnosis: Acute pain of left shoulder  Other symptoms and signs involving the musculoskeletal system  Stiffness of left shoulder, not elsewhere classified    Problem List Patient Active Problem List   Diagnosis Date Noted   Encounter for monitoring opioid maintenance therapy 03/26/2020   Loss of weight 01/23/2019   Adnexal mass 01/13/2019   Chronic pain syndrome 11/08/2018   Abnormal CT of the abdomen 06/23/2018   Gastric wall thickening 06/23/2018   History of colonic polyps 04/27/2018   History of colon cancer 04/27/2018   Family hx of colon cancer 04/27/2018   Early satiety 09/20/2017   Malignant neoplasm of colon ([place]) 01/21/2017   Sciatica of right side 03/06/2015   Knee pain, left 03/13/2014   Pain in joint, ankle and foot 03/13/2014   Muscle weakness (generalized) 01/10/2014   Difficulty in walking(719.7) 01/10/2014   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   Sciatica of left side 04/26/2013   Chest pain at rest 03/21/2013   Prediabetes 12/30/2012   Paroxysmal atrial fibrillation ([place])    ADENOCARCINOMA, COLON 06/24/2009   GASTROESOPHAGEAL REFLUX DISEASE 06/24/2009   Hyperlipidemia 06/12/2009    Guadelupe Sabin, OTR/L  [phone] 07/02/2021, 5:10 PM  [place], [place], [address] Phone: [phone]   Fax:  [phone]  Name: [name] MRN: [id number] Date of Birth: 12-Sep-1940

## 2021-07-08 ENCOUNTER — Encounter: Payer: Medicare Other | Admitting: Orthopedic Surgery

## 2021-07-09 ENCOUNTER — Other Ambulatory Visit: Payer: Self-pay

## 2021-07-09 ENCOUNTER — Encounter (HOSPITAL_COMMUNITY): Payer: Self-pay | Admitting: Occupational Therapy

## 2021-07-09 ENCOUNTER — Ambulatory Visit (HOSPITAL_COMMUNITY): Payer: Medicare Other | Attending: Orthopaedic Surgery | Admitting: Occupational Therapy

## 2021-07-09 DIAGNOSIS — M25612 Stiffness of left shoulder, not elsewhere classified: Secondary | ICD-10-CM | POA: Insufficient documentation

## 2021-07-09 DIAGNOSIS — R29898 Other symptoms and signs involving the musculoskeletal system: Secondary | ICD-10-CM | POA: Insufficient documentation

## 2021-07-09 DIAGNOSIS — M25512 Pain in left shoulder: Secondary | ICD-10-CM | POA: Insufficient documentation

## 2021-07-09 NOTE — Therapy (Signed)
[place], [place], [address] Phone: [phone]   Fax:  [phone]  Occupational Therapy Treatment  Patient Details  Name: [name] MRN: [id number] Date of Birth: 1941-02-14 Referring Provider (OT): Dr. Sanjuana Kava   Encounter Date: 07/09/2021   OT End of Session - 07/09/21 1609     Visit Number 2    Number of Visits 16    Date for OT Re-Evaluation 08/31/21   mini-reassessment 07/30/2021   Authorization Type U[employer] Medicare, $20 copay    Authorization Time Period no visit limit    Progress Note Due on Visit 10    OT Start Time 1524   pt arrived late   OT Stop Time 1603    OT Time Calculation (min) 39 min    Activity Tolerance Patient tolerated treatment well    Behavior During Therapy [facility] for tasks assessed/performed             Past Medical History:  Diagnosis Date   Adenocarcinoma, colon ([place]) 03/2005   Stage I; resected in 03/2005   Arthritis    Colon cancer ([place])    Removed surgicaly    Gastroesophageal reflux disease    Gastroparesis    Hearing loss    mild   Hyperlipidemia    Irregular heartbeat    Ovarian cyst 2008   Paroxysmal atrial fibrillation (HCC)    Palpitations; maintained on flecainide; -normal coronary angiography and 1999; negative stress nuclear study in 11/2005   PONV (postoperative nausea and vomiting)    Tobacco abuse, in remission    10-pack-years; quit in 2001    Past Surgical History:  Procedure Laterality Date   [place]  10/18/2017   Procedure: BIOPSY;  Surgeon: [name], MD;  [facility];  Service: Endoscopy;;  gastric   BIOPSY  07/06/2018   Procedure: BIOPSY;  Surgeon: [name], MD;  [facility];  Service: Endoscopy;;  gastric   CHOLECYSTECTOMY     COLON SURGERY  2006   Colon cancer   COLONOSCOPY  03/25/2012   [name], MD   COLONOSCOPY N/A 04/26/2017    Procedure: COLONOSCOPY;  Surgeon: [name], MD;  [facility];  Service: Endoscopy;  Laterality: N/A;  2:00   COLONOSCOPY N/A 07/06/2018   Procedure: COLONOSCOPY;  Surgeon: [name], MD;  [facility];  Service: Endoscopy;  Laterality: N/A;  10:30   COSMETIC SURGERY  09/2007   ESOPHAGOGASTRODUODENOSCOPY N/A 10/18/2017   Procedure: ESOPHAGOGASTRODUODENOSCOPY (EGD);  Surgeon: [name], MD;  [facility];  Service: Endoscopy;  Laterality: N/A;   ESOPHAGOGASTRODUODENOSCOPY N/A 07/06/2018   Procedure: ESOPHAGOGASTRODUODENOSCOPY (EGD);  Surgeon: [name], MD;  [facility];  Service: Endoscopy;  Laterality: N/A;   GIVENS CAPSULE STUDY N/A 12/26/2019   Procedure: GIVENS CAPSULE STUDY;  Surgeon: [name], MD;  [facility];  Service: Endoscopy;  Laterality: N/A;  730   PARTIAL COLECTOMY  2006   adenocarcinoma   POLYPECTOMY  04/26/2017   Procedure: POLYPECTOMY;  Surgeon: [name], MD;  [facility];  Service: Endoscopy;;  colon   POLYPECTOMY  07/06/2018   Procedure: POLYPECTOMY;  Surgeon: [name], MD;  [facility];  Service: Endoscopy;;  colon    TONSILLECTOMY      There were no vitals filed  for this visit.   Subjective Assessment - 07/09/21 1525     Subjective  S: Pain everyday.    Currently in Pain? Yes    Pain Score 7     Pain Location Shoulder    Pain Orientation Left    Pain Descriptors / Indicators Aching;Sore;Throbbing    Pain Type Acute pain    Pain Onset More than a month ago    Pain Frequency Constant    Aggravating Factors  movement    Pain Relieving Factors rest    Effect of Pain on Daily Activities mod to max effect on ADLs.    Multiple Pain Sites Yes    Pain Score 8    Pain Location Leg    Pain Orientation Left;Upper    Pain Descriptors / Indicators Throbbing    Pain Type Acute pain    Pain Radiating Towards stays in thigh    Pain Onset More  than a month ago    Pain Frequency Constant    Aggravating Factors  continually    Pain Relieving Factors pain medication    Effect of Pain on Daily Activities minimal                [facility] OT Assessment - 07/09/21 0001       Assessment   Medical Diagnosis s/p left humerus fx of greater tuberosity    Referring Provider (OT) Dr. Sanjuana Kava      Precautions   Precautions None   none listed in eval                     OT Treatments/Exercises (OP) - 07/09/21 0001       Exercises   Exercises Shoulder      Shoulder Exercises: Supine   Protraction PROM;10 reps    Horizontal ABduction PROM;10 reps    External Rotation PROM;10 reps    Internal Rotation PROM;10 reps    Flexion PROM;10 reps    ABduction PROM;10 reps      Shoulder Exercises: Seated   Other Seated Exercises protraction, elevation, depression, retraction x10 each seated.      Shoulder Exercises: Standing   Other Standing Exercises thumb tacks low level x10      Shoulder Exercises: Therapy Ball   Flexion 10 reps;Left   3' hold   ABduction 10 reps;Left   3" hold     Manual Therapy   Manual Therapy Myofascial release    Manual therapy comments Manual therapy completed prior to exercises.    Myofascial Release Myofascial release and manual stretching completed to the left upper arm, upper trapezius, and scapularis region to decrease fascial restrictions and increase joint mobility in a pain free zone.                      OT Short Term Goals - 07/09/21 1613       OT SHORT TERM GOAL #1   Title Patient will be educated and independent with HEP to increase mobility required for LUE use.    Time 4    Period Weeks    Status On-going    Target Date 08/01/21      OT SHORT TERM GOAL #2   Title Pt will increase LUE P/ROM to [facility] to increase ability to perform dressing tasks with minimal compensatory strategies.    Time 4    Period Weeks    Status On-going      OT SHORT TERM GOAL  #  3   Title Patient will increase RUE strength to 3/5 to be able to complete reaching tasks from waist to chest height during ADLs.    Time 4    Period Weeks    Status On-going               OT Long Term Goals - 07/09/21 1614       OT LONG TERM GOAL #1   Title Pt will decrease LUE pain to 3/10 or less to improve ability to sleep in position of comfort for 2+ hours without waking due to pain.    Time 8    Period Weeks    Status On-going    Target Date 08/31/21      OT LONG TERM GOAL #2   Title Pt will decrease LUE fascial restrictions to min amounts or less to improve ability to perform functional reaching tasks.    Time 8    Period Weeks    Status On-going      OT LONG TERM GOAL #3   Title Pt will increase LUE A/ROM to [facility] to improve ability to fix her hair using LUE as assist.    Time 8    Period Weeks    Status On-going      OT LONG TERM GOAL #4   Title Pt will increase LUE strength to 4+/5 or greater to improve ability to perform cooking tasks.    Time 8    Period Weeks    Status On-going                   Plan - 07/09/21 1610     Clinical Impression Statement A: Pt reports increase in pain today with 7/10. Pt reports she has started driving. Pt tolerated myofascial release and P/ROM in supine with active protraction, retraction, elevation, and depression in sitting. Moderate fascial restrictions mostly intrapezius region. Pt reported pain in L leg with education to discuss possible PT referall with her doctor.    Body Structure / Function / Physical Skills ADL;Endurance;Muscle spasms;UE functional use;Fascial restriction;Pain;ROM;IADL;Strength    Plan P: Continue Myofascial relaase, P/ROM supine and sitting with theraball. Possibly progress to AA/ROM if pt tolerates.    OT Home Exercise Plan eval: table slides             Patient will benefit from skilled therapeutic intervention in order to improve the following deficits and impairments:   Body  Structure / Function / Physical Skills: ADL, Endurance, Muscle spasms, UE functional use, Fascial restriction, Pain, ROM, IADL, Strength       Visit Diagnosis: Acute pain of left shoulder  Other symptoms and signs involving the musculoskeletal system  Stiffness of left shoulder, not elsewhere classified    Problem List Patient Active Problem List   Diagnosis Date Noted   Encounter for monitoring opioid maintenance therapy 03/26/2020   Loss of weight 01/23/2019   Adnexal mass 01/13/2019   Chronic pain syndrome 11/08/2018   Abnormal CT of the abdomen 06/23/2018   Gastric wall thickening 06/23/2018   History of colonic polyps 04/27/2018   History of colon cancer 04/27/2018   Family hx of colon cancer 04/27/2018   Early satiety 09/20/2017   Malignant neoplasm of colon ([place]) 01/21/2017   Sciatica of right side 03/06/2015   Knee pain, left 03/13/2014   Pain in joint, ankle and foot 03/13/2014   Muscle weakness (generalized) 01/10/2014   Difficulty in walking(719.7) 01/10/2014   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 [place], [place], [address] Phone: [phone]   Fax:  [phone]  Name: [name] MRN: [id number] Date of Birth: [birth year]

## 2021-07-11 ENCOUNTER — Other Ambulatory Visit: Payer: Self-pay | Admitting: Cardiology

## 2021-07-15 ENCOUNTER — Other Ambulatory Visit: Payer: Self-pay

## 2021-07-15 ENCOUNTER — Ambulatory Visit: Payer: Medicare Other

## 2021-07-15 ENCOUNTER — Encounter: Payer: Self-pay | Admitting: Orthopaedic Surgery

## 2021-07-15 ENCOUNTER — Ambulatory Visit (INDEPENDENT_AMBULATORY_CARE_PROVIDER_SITE_OTHER): Payer: Medicare Other | Admitting: Orthopaedic Surgery

## 2021-07-15 ENCOUNTER — Ambulatory Visit (HOSPITAL_COMMUNITY): Payer: Medicare Other | Admitting: Occupational Therapy

## 2021-07-15 ENCOUNTER — Encounter (HOSPITAL_COMMUNITY): Payer: Self-pay | Admitting: Occupational Therapy

## 2021-07-15 VITALS — BP 129/67 | HR 68 | Ht 63.0 in | Wt 103.0 lb

## 2021-07-15 DIAGNOSIS — M25512 Pain in left shoulder: Secondary | ICD-10-CM | POA: Diagnosis not present

## 2021-07-15 DIAGNOSIS — S42255D Nondisplaced fracture of greater tuberosity of left humerus, subsequent encounter for fracture with routine healing: Secondary | ICD-10-CM

## 2021-07-15 DIAGNOSIS — M25552 Pain in left hip: Secondary | ICD-10-CM

## 2021-07-15 DIAGNOSIS — H25813 Combined forms of age-related cataract, bilateral: Secondary | ICD-10-CM | POA: Diagnosis not present

## 2021-07-15 DIAGNOSIS — M25612 Stiffness of left shoulder, not elsewhere classified: Secondary | ICD-10-CM

## 2021-07-15 DIAGNOSIS — R29898 Other symptoms and signs involving the musculoskeletal system: Secondary | ICD-10-CM | POA: Diagnosis not present

## 2021-07-15 MED ORDER — PREDNISONE 5 MG (21) PO TBPK: ORAL_TABLET | ORAL | 0 refills | Status: DC

## 2021-07-15 NOTE — Progress Notes (Signed)
My shoulder is better. ? ?Her left shoulder is less tender.  She has been going to OT and I have read the notes.  She is making good progress. ? ?She has pain of the left hip again and lower back.  Pain is more over the left lateral hip bursa area.  Gait is good. ? ?ROM of the left hip is full but tender over the trochanteric area.  NV intact.  Lower back is non tender and full motion.   ? ?Examination of left Upper Extremity is done. ? Inspection: ?  Overall:  Elbow non-tender without crepitus or defects, forearm non-tender without crepitus or defects, wrist non-tender without crepitus or defects, hand non-tender.  ?  Shoulder: with glenohumeral joint tenderness, without effusion. ?  Upper arm:  without swelling and tenderness ? ? Range of motion: ?  Overall:  Full range of motion of the elbow, full range of motion of wrist and full range of motion in fingers. ?  Shoulder:  left  150 degrees forward flexion; 130 degrees abduction; 20 degrees internal rotation, 20 degrees external rotation, 10 degrees extension, 40 degrees adduction. ? ? Stability: ?  Overall:  Shoulder, elbow and wrist stable ? ? Strength and Tone: ?  Overall full shoulder muscles strength, full upper arm strength and normal upper arm bulk and tone.  ? ?Encounter Diagnoses  ?Name Primary?  ? Nondisplaced fracture of greater tuberosity of left humerus, subsequent encounter for fracture with routine healing Yes  ? Pain in left hip   ? ?I will give prednisone for her hip and back pain.  She is on Eliquis and I cannot give NSAIDs. ? ?Continue OT. ? ?Return in two weeks. ? ?Call if any problem. ? ?Precautions discussed. ? ?Electronically Signed ?Sanjuana Kava, MD ?3/7/20232:38 PM ? ?

## 2021-07-15 NOTE — Therapy (Signed)
[place], [place], [address] Phone: [phone]   Fax:  [phone]  Occupational Therapy Treatment  Patient Details  Name: [name] MRN: [id number] Date of Birth: 06-07-1940 Referring Provider (OT): Dr. Sanjuana Kava   Encounter Date: 07/15/2021   OT End of Session - 07/15/21 1154     Visit Number 3    Number of Visits 16    Date for OT Re-Evaluation 08/31/21   mini-reassessment 07/30/2021   Authorization Type UHC Medicare, $20 copay    Authorization Time Period no visit limit    Progress Note Due on Visit 10    OT Start Time 1118    OT Stop Time 1158    OT Time Calculation (min) 40 min    Activity Tolerance Patient tolerated treatment well    Behavior During Therapy [facility] for tasks assessed/performed             Past Medical History:  Diagnosis Date   Adenocarcinoma, colon ([place]) 03/2005   Stage I; resected in 03/2005   Arthritis    Colon cancer ([place])    Removed surgicaly    Gastroesophageal reflux disease    Gastroparesis    Hearing loss    mild   Hyperlipidemia    Irregular heartbeat    Ovarian cyst 2008   Paroxysmal atrial fibrillation ([facility])    Palpitations; maintained on flecainide; -normal coronary angiography and 1999; negative stress nuclear study in 11/2005   PONV (postoperative nausea and vomiting)    Tobacco abuse, in remission    10-pack-years; quit in 2001    Past Surgical History:  Procedure Laterality Date   [place]  10/18/2017   Procedure: BIOPSY;  Surgeon: [name], MD;  [facility];  Service: Endoscopy;;  gastric   BIOPSY  07/06/2018   Procedure: BIOPSY;  Surgeon: [name], MD;  [facility];  Service: Endoscopy;;  gastric   CHOLECYSTECTOMY     COLON SURGERY  2006   Colon cancer   COLONOSCOPY  03/25/2012   [name], MD   COLONOSCOPY N/A 04/26/2017   Procedure: COLONOSCOPY;   Surgeon: [name], MD;  [facility];  Service: Endoscopy;  Laterality: N/A;  2:00   COLONOSCOPY N/A 07/06/2018   Procedure: COLONOSCOPY;  Surgeon: [name], MD;  [facility];  Service: Endoscopy;  Laterality: N/A;  10:30   COSMETIC SURGERY  09/2007   ESOPHAGOGASTRODUODENOSCOPY N/A 10/18/2017   Procedure: ESOPHAGOGASTRODUODENOSCOPY (EGD);  Surgeon: [name], MD;  [facility];  Service: Endoscopy;  Laterality: N/A;   ESOPHAGOGASTRODUODENOSCOPY N/A 07/06/2018   Procedure: ESOPHAGOGASTRODUODENOSCOPY (EGD);  Surgeon: [name], MD;  [facility];  Service: Endoscopy;  Laterality: N/A;   GIVENS CAPSULE STUDY N/A 12/26/2019   Procedure: GIVENS CAPSULE STUDY;  Surgeon: [name], MD;  [facility];  Service: Endoscopy;  Laterality: N/A;  730   PARTIAL COLECTOMY  2006   adenocarcinoma   POLYPECTOMY  04/26/2017   Procedure: POLYPECTOMY;  Surgeon: [name], MD;  [facility];  Service: Endoscopy;;  colon   POLYPECTOMY  07/06/2018   Procedure: POLYPECTOMY;  Surgeon: [name], MD;  [facility];  Service: Endoscopy;;  colon    TONSILLECTOMY      There were no vitals filed for this visit.  Subjective Assessment - 07/15/21 1117     Subjective  S: It's feeling better.    Currently in Pain? Yes    Pain Score 5     Pain Location Shoulder    Pain Orientation Left    Pain Descriptors / Indicators Aching;Sore    Pain Type Acute pain    Pain Radiating Towards neck    Pain Onset More than a month ago    Pain Frequency Constant    Aggravating Factors  movement    Pain Relieving Factors rest    Effect of Pain on Daily Activities mod to max effect on ADLs    Multiple Pain Sites No                [facility] OT Assessment - 07/15/21 1116       Assessment   Medical Diagnosis s/p left humerus fx of greater tuberosity      Precautions   Precautions Shoulder    Type of  Shoulder Precautions progress as tolerated                      OT Treatments/Exercises (OP) - 07/15/21 1120       Exercises   Exercises Shoulder      Shoulder Exercises: Supine   Protraction PROM;5 reps;AAROM;10 reps    Horizontal ABduction PROM;5 reps;AAROM;10 reps    External Rotation PROM;5 reps;AAROM;10 reps    Internal Rotation PROM;5 reps;AAROM;10 reps    Flexion PROM;5 reps;AAROM;10 reps    ABduction PROM;5 reps;AAROM;10 reps      Shoulder Exercises: Seated   Extension AROM;10 reps    Row AROM;10 reps    Protraction AAROM;10 reps    External Rotation AAROM;10 reps    Internal Rotation AAROM;10 reps    Flexion AAROM;10 reps      Shoulder Exercises: Therapy Ball   Flexion 10 reps;Left    ABduction 10 reps;Left      Manual Therapy   Manual Therapy Myofascial release    Manual therapy comments Manual therapy completed prior to exercises.    Myofascial Release Myofascial release and manual stretching completed to the left upper arm, upper trapezius, and scapularis region to decrease fascial restrictions and increase joint mobility in a pain free zone.                      OT Short Term Goals - 07/09/21 1613       OT SHORT TERM GOAL #1   Title Patient will be educated and independent with HEP to increase mobility required for LUE use.    Time 4    Period Weeks    Status On-going    Target Date 08/01/21      OT SHORT TERM GOAL #2   Title Pt will increase LUE P/ROM to [facility] to increase ability to perform dressing tasks with minimal compensatory strategies.    Time 4    Period Weeks    Status On-going      OT SHORT TERM GOAL #3   Title Patient will increase RUE strength to 3/5 to be able to complete reaching tasks from waist to chest height during ADLs.    Time 4    Period Weeks    Status On-going               OT Long Term Goals - 07/09/21 1614       OT LONG TERM GOAL #1   Title Pt will decrease  LUE pain to 3/10 or less to  improve ability to sleep in position of comfort for 2+ hours without waking due to pain.    Time 8    Period Weeks    Status On-going    Target Date 08/31/21      OT LONG TERM GOAL #2   Title Pt will decrease LUE fascial restrictions to min amounts or less to improve ability to perform functional reaching tasks.    Time 8    Period Weeks    Status On-going      OT LONG TERM GOAL #3   Title Pt will increase LUE A/ROM to [facility] to improve ability to fix her hair using LUE as assist.    Time 8    Period Weeks    Status On-going      OT LONG TERM GOAL #4   Title Pt will increase LUE strength to 4+/5 or greater to improve ability to perform cooking tasks.    Time 8    Period Weeks    Status On-going                   Plan - 07/15/21 1147     Clinical Impression Statement A: Pt reports the abduction table slide is the most difficult for her. Continued with myofascial release and passive stretching, pt with ROM WFL during passive stretching. Progressed to AA/ROM in supine and sitting, pt reports fatigue with tasks, occasional discomfort. Added scapular A/ROM and continued with therapy ball stretches at end of session. Verbal cuing for form and technique.    Body Structure / Function / Physical Skills ADL;Endurance;Muscle spasms;UE functional use;Fascial restriction;Pain;ROM;IADL;Strength    Plan P: Continue with AA/ROM and update HEP, add wall wash and/or pvc pipe slide    OT Home Exercise Plan eval: table slides    Consulted and Agree with Plan of Care Patient             Patient will benefit from skilled therapeutic intervention in order to improve the following deficits and impairments:   Body Structure / Function / Physical Skills: ADL, Endurance, Muscle spasms, UE functional use, Fascial restriction, Pain, ROM, IADL, Strength       Visit Diagnosis: Acute pain of left shoulder  Other symptoms and signs involving the musculoskeletal system  Stiffness of left  shoulder, not elsewhere classified    Problem List Patient Active Problem List   Diagnosis Date Noted   Encounter for monitoring opioid maintenance therapy 03/26/2020   Loss of weight 01/23/2019   Adnexal mass 01/13/2019   Chronic pain syndrome 11/08/2018   Abnormal CT of the abdomen 06/23/2018   Gastric wall thickening 06/23/2018   History of colonic polyps 04/27/2018   History of colon cancer 04/27/2018   Family hx of colon cancer 04/27/2018   Early satiety 09/20/2017   Malignant neoplasm of colon ([place]) 01/21/2017   Sciatica of right side 03/06/2015   Knee pain, left 03/13/2014   Pain in joint, ankle and foot 03/13/2014   Muscle weakness (generalized) 01/10/2014   Difficulty in walking(719.7) 01/10/2014   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   Sciatica of left side 04/26/2013   Chest pain at rest 03/21/2013   Prediabetes 12/30/2012   Paroxysmal atrial fibrillation ([place])    ADENOCARCINOMA, COLON 06/24/2009   GASTROESOPHAGEAL REFLUX DISEASE 06/24/2009   Hyperlipidemia 06/12/2009    Guadelupe Sabin, OTR/L  [phone] 07/15/2021, 11:59 AM  [facility] [address]  [place], [place], [address] Phone: [phone]   Fax:  [phone]  Name: LATRAVIA SOUTHGATE MRN: [id number] Date of Birth: 1940-05-18

## 2021-07-17 ENCOUNTER — Ambulatory Visit (HOSPITAL_COMMUNITY): Payer: Medicare Other | Admitting: Occupational Therapy

## 2021-07-17 ENCOUNTER — Encounter (HOSPITAL_COMMUNITY): Payer: Self-pay | Admitting: Occupational Therapy

## 2021-07-17 ENCOUNTER — Other Ambulatory Visit: Payer: Self-pay

## 2021-07-17 DIAGNOSIS — M25512 Pain in left shoulder: Secondary | ICD-10-CM

## 2021-07-17 DIAGNOSIS — R29898 Other symptoms and signs involving the musculoskeletal system: Secondary | ICD-10-CM | POA: Diagnosis not present

## 2021-07-17 DIAGNOSIS — M25612 Stiffness of left shoulder, not elsewhere classified: Secondary | ICD-10-CM | POA: Diagnosis not present

## 2021-07-17 NOTE — Therapy (Signed)
?OUTPATIENT OCCUPATIONAL THERAPY TREATMENT NOTE ? ? ?Patient Name: [name] ?MRN: [id number] ?DOB:04-05-1941, 81 y.o., female ?Today's Date: 07/17/2021 ? ?PCP: Kathyrn Drown, MD ?REFERRING PROVIDER: Dr. Sanjuana Kava ? ? OT End of Session - 07/17/21 1346   ? ? Visit Number 4   ? Number of Visits 16   ? Date for OT Re-Evaluation 08/31/21   mini-reassessment 07/30/2021  ? Authorization Type [employer] Medicare, $20 copay   ? Authorization Time Period no visit limit   ? Progress Note Due on Visit 10   ? OT Start Time 1300   ? OT Stop Time 1340   ? OT Time Calculation (min) 40 min   ? Activity Tolerance Patient tolerated treatment well   ? Behavior During Therapy [facility] for tasks assessed/performed   ? ?  ?  ? ?  ? ? ?Past Medical History:  ?Diagnosis Date  ? Adenocarcinoma, colon ([place]) 03/2005  ? Stage I; resected in 03/2005  ? Arthritis   ? Colon cancer ([place])   ? Removed surgicaly   ? Gastroesophageal reflux disease   ? Gastroparesis   ? Hearing loss   ? mild  ? Hyperlipidemia   ? Irregular heartbeat   ? Ovarian cyst 2008  ? Paroxysmal atrial fibrillation (HCC)   ? Palpitations; maintained on flecainide; -normal coronary angiography and 1999; negative stress nuclear study in 11/2005  ? PONV (postoperative nausea and vomiting)   ? Tobacco abuse, in remission   ? 10-pack-years; quit in 2001  ? ?Past Surgical History:  ?Procedure Laterality Date  ? ABDOMINAL HYSTERECTOMY  1983  ? BACK SURGERY    ? BIOPSY  10/18/2017  ? Procedure: BIOPSY;  Surgeon: [name], MD;  [facility];  Service: Endoscopy;;  gastric  ? BIOPSY  07/06/2018  ? Procedure: BIOPSY;  Surgeon: [name], MD;  [facility];  Service: Endoscopy;;  gastric  ? CHOLECYSTECTOMY    ? COLON SURGERY  2006  ? Colon cancer  ? COLONOSCOPY  03/25/2012  ? [name], MD  ? COLONOSCOPY N/A 04/26/2017  ? Procedure: COLONOSCOPY;  Surgeon: [name], MD;  [facility];  Service: Endoscopy;  Laterality: N/A;  2:00  ?  COLONOSCOPY N/A 07/06/2018  ? Procedure: COLONOSCOPY;  Surgeon: [name], MD;  [facility];  Service: Endoscopy;  Laterality: N/A;  10:30  ? COSMETIC SURGERY  09/2007  ? ESOPHAGOGASTRODUODENOSCOPY N/A 10/18/2017  ? Procedure: ESOPHAGOGASTRODUODENOSCOPY (EGD);  Surgeon: [name], MD;  [facility];  Service: Endoscopy;  Laterality: N/A;  ? ESOPHAGOGASTRODUODENOSCOPY N/A 07/06/2018  ? Procedure: ESOPHAGOGASTRODUODENOSCOPY (EGD);  Surgeon: [name], MD;  [facility];  Service: Endoscopy;  Laterality: N/A;  ? GIVENS CAPSULE STUDY N/A 12/26/2019  ? Procedure: GIVENS CAPSULE STUDY;  Surgeon: [name], MD;  [facility];  Service: Endoscopy;  Laterality: N/A;  730  ? PARTIAL COLECTOMY  2006  ? adenocarcinoma  ? POLYPECTOMY  04/26/2017  ? Procedure: POLYPECTOMY;  Surgeon: [name], MD;  [facility];  Service: Endoscopy;;  colon  ? POLYPECTOMY  07/06/2018  ? Procedure: POLYPECTOMY;  Surgeon: [name], MD;  [facility];  Service: Endoscopy;;  colon ?  ? TONSILLECTOMY    ? ?Patient Active Problem List  ? Diagnosis Date Noted  ? Encounter for monitoring opioid maintenance therapy 03/26/2020  ? Loss of weight 01/23/2019  ? Adnexal mass 01/13/2019  ?  Chronic pain syndrome 11/08/2018  ? Abnormal CT of the abdomen 06/23/2018  ? Gastric wall thickening 06/23/2018  ? History of colonic polyps 04/27/2018  ? History of colon cancer 04/27/2018  ? Family hx of colon cancer 04/27/2018  ? Early satiety 09/20/2017  ? Malignant neoplasm of colon ([place]) 01/21/2017  ? Sciatica of right side 03/06/2015  ? Knee pain, left 03/13/2014  ? Pain in joint, ankle and foot 03/13/2014  ? Muscle weakness (generalized) 01/10/2014  ? Difficulty in walking(719.7) 01/10/2014  ? 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 Sciatica of left side 04/26/2013  ? Chest pain at rest 03/21/2013  ? Prediabetes 12/30/2012  ? Paroxysmal atrial  fibrillation (HCC)   ? ADENOCARCINOMA, COLON 06/24/2009  ? GASTROESOPHAGEAL REFLUX DISEASE 06/24/2009  ? Hyperlipidemia 06/12/2009  ? ? ?ONSET DATE: 05/26/21 ? ?REFERRING DIAG: s/p left humerus fx of greater tuberosity  ? ?THERAPY DIAG:  ?Acute pain of left shoulder ? ?Other symptoms and signs involving the musculoskeletal system ? ?Stiffness of left shoulder, not elsewhere classified ? ? ?PERTINENT HISTORY:  Pt is a 81 y/o female s/p fall on 05/26/21 and sustaining a left humerus fx of greater tuberosity ? ?PRECAUTIONS: None; progress as tolerated ? ?SUBJECTIVE: S: He gave me prednisone for my leg and my shoulder.  ? ?PAIN:  ?Are you having pain? Yes ?NPRS scale: 5/10 ?Pain location: shoulder ?Pain orientation: Left  ?PAIN TYPE: Sore ?Pain description: intermittent  ?Aggravating factors: increased use  ?Relieving factors: pain medication ? ? ? ?OBJECTIVE:  ? ?TODAY'S TREATMENT:  ?-P/ROM-shoulder flexion, abduction, er/IR, horizontal ab/adduction, 5X each ?-AA/ROM-shoulder protraction, flexion, er/IR, horizontal ab/adduction, abduction, 10X each, supine and seated ?-Wall wash, 1'  ?-PVC Pipe slide, 10X flexion ?-Pulleys, 1' flexion 1' abduction ?-Functional reaching task: pt placing 10 cones onto middle shelf of overhead cabinet in flexion, removing in abduction. ? ? ?PATIENT EDUCATION: ?Education details: shoulder AA/ROM ?Person educated: Patient ?Education method: Explanation, Demonstration, and Handouts ?Education comprehension: verbalized understanding and returned demonstration ? ? ?[place] ?Shoulder AA/ROM ? ? OT Short Term Goals - 07/09/21 1613   ? ?  ? OT SHORT TERM GOAL #1  ? Title Patient will be educated and independent with HEP to increase mobility required for LUE use.   ? Time 4   ? Period Weeks   ? Status On-going   ? Target Date 08/01/21   ?  ? OT SHORT TERM GOAL #2  ? Title Pt will increase LUE P/ROM to [facility] to increase ability to perform dressing tasks with minimal compensatory  strategies.   ? Time 4   ? Period Weeks   ? Status On-going   ?  ? OT SHORT TERM GOAL #3  ? Title Patient will increase RUE strength to 3/5 to be able to complete reaching tasks from waist to chest height during ADLs.   ? Time 4   ? Period Weeks   ? Status On-going   ? ?  ?  ? ?  ? ? ? OT Long Term Goals - 07/09/21 1614   ? ?  ? OT LONG TERM GOAL #1  ? Title Pt will decrease LUE pain to 3/10 or less to improve ability to sleep in position of comfort for 2+ hours without waking due to pain.   ? Time 8   ? Period Weeks   ? Status On-going   ? Target Date 08/31/21   ?  ? OT LONG TERM GOAL #2  ? Title  Pt will decrease LUE fascial restrictions to min amounts or less to improve ability to perform functional reaching tasks.   ? Time 8   ? Period Weeks   ? Status On-going   ?  ? OT LONG TERM GOAL #3  ? Title Pt will increase LUE A/ROM to [facility] to improve ability to fix her hair using LUE as assist.   ? Time 8   ? Period Weeks   ? Status On-going   ?  ? OT LONG TERM GOAL #4  ? Title Pt will increase LUE strength to 4+/5 or greater to improve ability to perform cooking tasks.   ? Time 8   ? Period Weeks   ? Status On-going   ? ?  ?  ? ?  ? ? ? Plan - 07/17/21 1314   ? ? Clinical Impression Statement A: Pt reports she went to the MD and he gave her prednisone for her shoulder and hip. Pt did not complete her HEP yesterday but she did incorporate her LUE while cleaning the house. No myofascial release necessary today as pt with min fascial restrictions in upper arm. Continued with P/ROM and AA/ROM, pt tolerating ROM WFL during tasks. Added wall wash, pulleys, pvc pipe slide, and functional reaching task this session. Verbal cuing for form and technique during tasks.   ? Body Structure / Function / Physical Skills ADL;Endurance;Muscle spasms;UE functional use;Fascial restriction;Pain;ROM;IADL;Strength   ? Plan P: follow up on HEP, continued with AA/ROM, trial red scapular theraband   ? OT Home Exercise Plan eval: table slides;  3/9: AA/ROM   ? Consulted and Agree with Plan of Care Patient   ? ?  ?  ? ?  ? ? ? ?Guadelupe Sabin, OTR/L  ?[phone] ?07/17/2021, 1:47 PM ? ?  ? ? ? ?

## 2021-07-17 NOTE — Patient Instructions (Signed)

## 2021-07-22 ENCOUNTER — Ambulatory Visit (HOSPITAL_COMMUNITY): Payer: Medicare Other | Admitting: Occupational Therapy

## 2021-07-22 ENCOUNTER — Other Ambulatory Visit: Payer: Self-pay

## 2021-07-22 ENCOUNTER — Encounter (HOSPITAL_COMMUNITY): Payer: Self-pay | Admitting: Occupational Therapy

## 2021-07-22 DIAGNOSIS — M25612 Stiffness of left shoulder, not elsewhere classified: Secondary | ICD-10-CM

## 2021-07-22 DIAGNOSIS — M25512 Pain in left shoulder: Secondary | ICD-10-CM

## 2021-07-22 DIAGNOSIS — R29898 Other symptoms and signs involving the musculoskeletal system: Secondary | ICD-10-CM | POA: Diagnosis not present

## 2021-07-22 NOTE — Therapy (Signed)
?OUTPATIENT OCCUPATIONAL THERAPY TREATMENT NOTE ? ? ?Patient Name: [name] ?MRN: [id number] ?DOB:1941-02-15, 81 y.o., female ?Today's Date: 07/22/2021 ? ?PCP: Kathyrn Drown, MD ?REFERRING PROVIDER: Dr. Sanjuana Kava ? ? OT End of Session - 07/22/21 1342   ? ? Visit Number 5   ? Number of Visits 16   ? Date for OT Re-Evaluation 08/31/21   mini-reassessment 07/30/2021  ? Authorization Type [employer] Medicare, $20 copay   ? Authorization Time Period no visit limit   ? Progress Note Due on Visit 10   ? OT Start Time 1300   ? OT Stop Time 1340   ? OT Time Calculation (min) 40 min   ? Activity Tolerance Patient tolerated treatment well   ? Behavior During Therapy [facility] for tasks assessed/performed   ? ?  ?  ? ?  ? ? ?Past Medical History:  ?Diagnosis Date  ? Adenocarcinoma, colon ([place]) 03/2005  ? Stage I; resected in 03/2005  ? Arthritis   ? Colon cancer ([place])   ? Removed surgicaly   ? Gastroesophageal reflux disease   ? Gastroparesis   ? Hearing loss   ? mild  ? Hyperlipidemia   ? Irregular heartbeat   ? Ovarian cyst 2008  ? Paroxysmal atrial fibrillation (HCC)   ? Palpitations; maintained on flecainide; -normal coronary angiography and 1999; negative stress nuclear study in 11/2005  ? PONV (postoperative nausea and vomiting)   ? Tobacco abuse, in remission   ? 10-pack-years; quit in 2001  ? ?Past Surgical History:  ?Procedure Laterality Date  ? ABDOMINAL HYSTERECTOMY  1983  ? BACK SURGERY    ? BIOPSY  10/18/2017  ? Procedure: BIOPSY;  Surgeon: [name], MD;  [facility];  Service: Endoscopy;;  gastric  ? BIOPSY  07/06/2018  ? Procedure: BIOPSY;  Surgeon: [name], MD;  [facility];  Service: Endoscopy;;  gastric  ? CHOLECYSTECTOMY    ? COLON SURGERY  2006  ? Colon cancer  ? COLONOSCOPY  03/25/2012  ? [name], MD  ? COLONOSCOPY N/A 04/26/2017  ? Procedure: COLONOSCOPY;  Surgeon: [name], MD;  [facility];  Service: Endoscopy;  Laterality: N/A;  2:00  ?  COLONOSCOPY N/A 07/06/2018  ? Procedure: COLONOSCOPY;  Surgeon: [name], MD;  [facility];  Service: Endoscopy;  Laterality: N/A;  10:30  ? COSMETIC SURGERY  09/2007  ? ESOPHAGOGASTRODUODENOSCOPY N/A 10/18/2017  ? Procedure: ESOPHAGOGASTRODUODENOSCOPY (EGD);  Surgeon: [name], MD;  [facility];  Service: Endoscopy;  Laterality: N/A;  ? ESOPHAGOGASTRODUODENOSCOPY N/A 07/06/2018  ? Procedure: ESOPHAGOGASTRODUODENOSCOPY (EGD);  Surgeon: [name], MD;  [facility];  Service: Endoscopy;  Laterality: N/A;  ? GIVENS CAPSULE STUDY N/A 12/26/2019  ? Procedure: GIVENS CAPSULE STUDY;  Surgeon: [name], MD;  [facility];  Service: Endoscopy;  Laterality: N/A;  730  ? PARTIAL COLECTOMY  2006  ? adenocarcinoma  ? POLYPECTOMY  04/26/2017  ? Procedure: POLYPECTOMY;  Surgeon: [name], MD;  [facility];  Service: Endoscopy;;  colon  ? POLYPECTOMY  07/06/2018  ? Procedure: POLYPECTOMY;  Surgeon: [name], MD;  [facility];  Service: Endoscopy;;  colon ?  ? TONSILLECTOMY    ? ?Patient Active Problem List  ? Diagnosis Date Noted  ? Encounter for monitoring opioid maintenance therapy 03/26/2020  ? Loss of weight 01/23/2019  ? Adnexal mass 01/13/2019  ?  Chronic pain syndrome 11/08/2018  ? Abnormal CT of the abdomen 06/23/2018  ? Gastric wall thickening 06/23/2018  ? History of colonic polyps 04/27/2018  ? History of colon cancer 04/27/2018  ? Family hx of colon cancer 04/27/2018  ? Early satiety 09/20/2017  ? Malignant neoplasm of colon ([place]) 01/21/2017  ? Sciatica of right side 03/06/2015  ? Knee pain, left 03/13/2014  ? Pain in joint, ankle and foot 03/13/2014  ? Muscle weakness (generalized) 01/10/2014  ? Difficulty in walking(719.7) 01/10/2014  ? 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 Sciatica of left side 04/26/2013  ? Chest pain at rest 03/21/2013  ? Prediabetes 12/30/2012  ? Paroxysmal atrial  fibrillation (HCC)   ? ADENOCARCINOMA, COLON 06/24/2009  ? GASTROESOPHAGEAL REFLUX DISEASE 06/24/2009  ? Hyperlipidemia 06/12/2009  ? ? ?ONSET DATE: 05/26/21 ? ?REFERRING DIAG: s/p left humerus fx of greater tuberosity  ? ?THERAPY DIAG:  ?Other symptoms and signs involving the musculoskeletal system ? ?Acute pain of left shoulder ? ?Stiffness of left shoulder, not elsewhere classified ? ? ?PERTINENT HISTORY: Pt is a 81 y/o female s/p fall on 05/26/21 and sustaining a left humerus fx of greater tuberosity ? ?PRECAUTIONS: None; progress as tolerated ? ?SUBJECTIVE: S: I did those exercises.  ? ?PAIN:  ?Are you having pain? Yes: NPRS scale: 4/10 ?Pain location: left shoulder ?Pain description: aching, throbbing ?Aggravating factors: weather ?Relieving factors: pain medication ? ? ? ? ?OBJECTIVE:  ? ?TODAY'S TREATMENT:  ?  07/22/21 ?-P/ROM-shoulder flexion, abduction, er/IR, horizontal ab/adduction, 5X each ?-A/ROM-shoulder protraction, er/IR, 10X each, supine ?-AA/ROM-supine-flexion, abduction, horizontal abduction, 10X each ?-Proximal shoulder strengthening in supine, 10X each, no rest breaks ?-AA/ROM-shoulder protraction, flexion, er/IR, horizontal ab/adduction, abduction, 10X each, seated ?-Wall wash, 1'  ?-Red scapular theraband: row, extension, retraction, 10X each ?-UBE, level 1, 2' forward 2' reverse, pace: 3.0    ? ? ?  07/17/21 ?-P/ROM-shoulder flexion, abduction, er/IR, horizontal ab/adduction, 5X each ?-AA/ROM-shoulder protraction, flexion, er/IR, horizontal ab/adduction, abduction, 10X each, supine and seated ?-Wall wash, 1'  ?-PVC Pipe slide, 10X flexion ?-Pulleys, 1' flexion 1' abduction ?-Functional reaching task: pt placing 10 cones onto middle shelf of overhead cabinet in flexion, removing in abduction. ? ? ? ? ? OT Short Term Goals  ? ?  ? OT SHORT TERM GOAL #1  ? Title Patient will be educated and independent with HEP to increase mobility required for LUE use.   ? Time 4   ? Period Weeks   ? Status  On-going   ? Target Date 08/01/21   ?  ? OT SHORT TERM GOAL #2  ? Title Pt will increase LUE P/ROM to [facility] to increase ability to perform dressing tasks with minimal compensatory strategies.   ? Time 4   ? Period Weeks   ? Status On-going   ?  ? OT SHORT TERM GOAL #3  ? Title Patient will increase RUE strength to 3/5 to be able to complete reaching tasks from waist to chest height during ADLs.   ? Time 4   ? Period Weeks   ? Status On-going   ? ?  ?  ? ?  ? ? ? OT Long Term Goals   ? ?  ? OT LONG TERM GOAL #1  ? Title Pt will decrease LUE pain to 3/10 or less to improve ability to sleep in position of comfort for 2+ hours without waking due to pain.   ? Time 8   ? Period Weeks   ?  Status On-going   ? Target Date 08/31/21   ?  ? OT LONG TERM GOAL #2  ? Title Pt will decrease LUE fascial restrictions to min amounts or less to improve ability to perform functional reaching tasks.   ? Time 8   ? Period Weeks   ? Status On-going   ?  ? OT LONG TERM GOAL #3  ? Title Pt will increase LUE A/ROM to [facility] to improve ability to fix her hair using LUE as assist.   ? Time 8   ? Period Weeks   ? Status On-going   ?  ? OT LONG TERM GOAL #4  ? Title Pt will increase LUE strength to 4+/5 or greater to improve ability to perform cooking tasks.   ? Time 8   ? Period Weeks   ? Status On-going   ? ?  ?  ? ?  ? ? ? Plan - 07/22/21 1325   ? ? Clinical Impression Statement A: Pt reports her HEP is going well. Continued with passive stretching and AA/ROM today, began progressing towards A/ROM in supine with protraction and er/IR. Pt with ROM WFL in all planes today both passive and active. Added red scapular theraband and UBE today. Verbal cuing for form and technique.   ? Body Structure / Function / Physical Skills ADL;Endurance;Muscle spasms;UE functional use;Fascial restriction;Pain;ROM;IADL;Strength   ? Plan P: continue progressing to A/ROM in supine, continue scapular theraband, functional reaching task   ? OT Home Exercise Plan eval:  table slides; 3/9: AA/ROM   ? Consulted and Agree with Plan of Care Patient   ? ?  ?  ? ?  ? ? ? ?Guadelupe Sabin, OTR/L  ?[phone] ?07/22/2021, 1:43 PM ? ?  ? ? ? ?

## 2021-07-24 ENCOUNTER — Encounter (HOSPITAL_COMMUNITY): Payer: Self-pay

## 2021-07-24 ENCOUNTER — Ambulatory Visit (HOSPITAL_COMMUNITY): Payer: Medicare Other

## 2021-07-24 ENCOUNTER — Other Ambulatory Visit: Payer: Self-pay

## 2021-07-24 DIAGNOSIS — M25612 Stiffness of left shoulder, not elsewhere classified: Secondary | ICD-10-CM | POA: Diagnosis not present

## 2021-07-24 DIAGNOSIS — R29898 Other symptoms and signs involving the musculoskeletal system: Secondary | ICD-10-CM | POA: Diagnosis not present

## 2021-07-24 DIAGNOSIS — M25512 Pain in left shoulder: Secondary | ICD-10-CM | POA: Diagnosis not present

## 2021-07-24 NOTE — Therapy (Signed)
?OUTPATIENT OCCUPATIONAL THERAPY TREATMENT NOTE ? ? ?Patient Name: [name] ?MRN: [id number] ?DOB:1941/02/09, 81 y.o., female ?Today's Date: 07/24/2021 ? ?PCP: Kathyrn Drown, MD ?REFERRING PROVIDER: Dr. Sanjuana Kava ? ? OT End of Session - 07/24/21 1326   ? ? Visit Number 6   ? Number of Visits 16   ? Date for OT Re-Evaluation 08/31/21   mini-reassessment 07/30/2021  ? Authorization Type [employer] Medicare, $20 copay   ? Authorization Time Period no visit limit   ? Progress Note Due on Visit 10   ? OT Start Time 1304   ? Activity Tolerance Patient tolerated treatment well   ? Behavior During Therapy [facility] for tasks assessed/performed   ? ?  ?  ? ?  ? ? ? ?Past Medical History:  ?Diagnosis Date  ? Adenocarcinoma, colon ([place]) 03/2005  ? Stage I; resected in 03/2005  ? Arthritis   ? Colon cancer ([place])   ? Removed surgicaly   ? Gastroesophageal reflux disease   ? Gastroparesis   ? Hearing loss   ? mild  ? Hyperlipidemia   ? Irregular heartbeat   ? Ovarian cyst 2008  ? Paroxysmal atrial fibrillation (HCC)   ? Palpitations; maintained on flecainide; -normal coronary angiography and 1999; negative stress nuclear study in 11/2005  ? PONV (postoperative nausea and vomiting)   ? Tobacco abuse, in remission   ? 10-pack-years; quit in 2001  ? ?Past Surgical History:  ?Procedure Laterality Date  ? ABDOMINAL HYSTERECTOMY  1983  ? BACK SURGERY    ? BIOPSY  10/18/2017  ? Procedure: BIOPSY;  Surgeon: [name], MD;  [facility];  Service: Endoscopy;;  gastric  ? BIOPSY  07/06/2018  ? Procedure: BIOPSY;  Surgeon: [name], MD;  [facility];  Service: Endoscopy;;  gastric  ? CHOLECYSTECTOMY    ? COLON SURGERY  2006  ? Colon cancer  ? COLONOSCOPY  03/25/2012  ? [name], MD  ? COLONOSCOPY N/A 04/26/2017  ? Procedure: COLONOSCOPY;  Surgeon: [name], MD;  [facility];  Service: Endoscopy;  Laterality: N/A;  2:00  ? COLONOSCOPY N/A 07/06/2018  ? Procedure: COLONOSCOPY;  Surgeon:  [name], MD;  [facility];  Service: Endoscopy;  Laterality: N/A;  10:30  ? COSMETIC SURGERY  09/2007  ? ESOPHAGOGASTRODUODENOSCOPY N/A 10/18/2017  ? Procedure: ESOPHAGOGASTRODUODENOSCOPY (EGD);  Surgeon: [name], MD;  [facility];  Service: Endoscopy;  Laterality: N/A;  ? ESOPHAGOGASTRODUODENOSCOPY N/A 07/06/2018  ? Procedure: ESOPHAGOGASTRODUODENOSCOPY (EGD);  Surgeon: [name], MD;  [facility];  Service: Endoscopy;  Laterality: N/A;  ? GIVENS CAPSULE STUDY N/A 12/26/2019  ? Procedure: GIVENS CAPSULE STUDY;  Surgeon: [name], MD;  [facility];  Service: Endoscopy;  Laterality: N/A;  730  ? PARTIAL COLECTOMY  2006  ? adenocarcinoma  ? POLYPECTOMY  04/26/2017  ? Procedure: POLYPECTOMY;  Surgeon: [name], MD;  [facility];  Service: Endoscopy;;  colon  ? POLYPECTOMY  07/06/2018  ? Procedure: POLYPECTOMY;  Surgeon: [name], MD;  [facility];  Service: Endoscopy;;  colon ?  ? TONSILLECTOMY    ? ?Patient Active Problem List  ? Diagnosis Date Noted  ? Encounter for monitoring opioid maintenance therapy 03/26/2020  ? Loss of weight 01/23/2019  ? Adnexal mass 01/13/2019  ? Chronic pain syndrome 11/08/2018  ? Abnormal CT of the abdomen 06/23/2018  ?  Gastric wall thickening 06/23/2018  ? History of colonic polyps 04/27/2018  ? History of colon cancer 04/27/2018  ? Family hx of colon cancer 04/27/2018  ? Early satiety 09/20/2017  ? Malignant neoplasm of colon ([place]) 01/21/2017  ? Sciatica of right side 03/06/2015  ? Knee pain, left 03/13/2014  ? Pain in joint, ankle and foot 03/13/2014  ? Muscle weakness (generalized) 01/10/2014  ? Difficulty in walking(719.7) 01/10/2014  ? 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 Sciatica of left side 04/26/2013  ? Chest pain at rest 03/21/2013  ? Prediabetes 12/30/2012  ? Paroxysmal atrial fibrillation (HCC)   ? ADENOCARCINOMA, COLON 06/24/2009  ?  GASTROESOPHAGEAL REFLUX DISEASE 06/24/2009  ? Hyperlipidemia 06/12/2009  ? ? ?ONSET DATE: 05/26/21 ? ?REFERRING DIAG: s/p left humerus fx of greater tuberosity  ? ?THERAPY DIAG:  ?Other symptoms and signs involving the musculoskeletal system ? ?Acute pain of left shoulder ? ?Stiffness of left shoulder, not elsewhere classified ? ? ?PERTINENT HISTORY: Pt is a 81 y/o female s/p fall on 05/26/21 and sustaining a left humerus fx of greater tuberosity ? ?PRECAUTIONS: None; progress as tolerated ? ?SUBJECTIVE: S: It's really sore.  ? ?PAIN:  ?Are you having pain? Yes: NPRS scale: 5/10 ?Pain location: left shoulder ?Pain description: sore ?Aggravating factors: unknown ?Relieving factors: prescription pain medication ? ? ? ? ?OBJECTIVE:  ? ?TODAY'S TREATMENT:  ?-P/ROM-shoulder flexion, abduction, er/IR, horizontal ab/adduction, 5X each ?-A/ROM-shoulder protraction, er/IR, horizontal ab/adduction, abduction, flexion 10X each, supine ?  -Manual therapy: myofascial release and manual stretching completed to left upper arm, upper trapezius, and scapularis region.  ?  -Proximal shoulder strengthening in supine, 10X each, no rest breaks ?  -High level reaching: Squigz placed on door then removed using LUE ? ?  07/22/21 ?-P/ROM-shoulder flexion, abduction, er/IR, horizontal ab/adduction, 5X each ?-A/ROM-shoulder protraction, er/IR, 10X each, supine ?-AA/ROM-supine-flexion, abduction, horizontal abduction, 10X each ?-Proximal shoulder strengthening in supine, 10X each, no rest breaks ?-AA/ROM-shoulder protraction, flexion, er/IR, horizontal ab/adduction, abduction, 10X each, seated ?-Wall wash, 1'  ?-Red scapular theraband: row, extension, retraction, 10X each ?-UBE, level 1, 2' forward 2' reverse, pace: 3.0    ? ? ?  07/17/21 ?-P/ROM-shoulder flexion, abduction, er/IR, horizontal ab/adduction, 5X each ?-AA/ROM-shoulder protraction, flexion, er/IR, horizontal ab/adduction, abduction, 10X each, supine and seated ?-Wall wash, 1'  ?-PVC  Pipe slide, 10X flexion ?-Pulleys, 1' flexion 1' abduction ?-Functional reaching task: pt placing 10 cones onto middle shelf of overhead cabinet in flexion, removing in abduction. ? ? ?Education Details A/ROM supine   ?  Person(s) Educated Patient   ?  Methods Explanation;Demonstration;Handout   ?  Comprehension Verbalized understanding;Returned demonstration   ?  ? ? ? OT Short Term Goals  ? ?  ? OT SHORT TERM GOAL #1  ? Title Patient will be educated and independent with HEP to increase mobility required for LUE use.   ? Time 4   ? Period Weeks   ? Status On-going   ? Target Date 08/01/21   ?  ? OT SHORT TERM GOAL #2  ? Title Pt will increase LUE P/ROM to [facility] to increase ability to perform dressing tasks with minimal compensatory strategies.   ? Time 4   ? Period Weeks   ? Status On-going   ?  ? OT SHORT TERM GOAL #3  ? Title Patient will increase RUE strength to 3/5 to be able to complete reaching tasks from waist to chest height during ADLs.   ? Time 4   ?  Period Weeks   ? Status On-going   ? ?  ?  ? ?  ? ? ? OT Long Term Goals   ? ?  ? OT LONG TERM GOAL #1  ? Title Pt will decrease LUE pain to 3/10 or less to improve ability to sleep in position of comfort for 2+ hours without waking due to pain.   ? Time 8   ? Period Weeks   ? Status On-going   ? Target Date 08/31/21   ?  ? OT LONG TERM GOAL #2  ? Title Pt will decrease LUE fascial restrictions to min amounts or less to improve ability to perform functional reaching tasks.   ? Time 8   ? Period Weeks   ? Status On-going   ?  ? OT LONG TERM GOAL #3  ? Title Pt will increase LUE A/ROM to [facility] to improve ability to fix her hair using LUE as assist.   ? Time 8   ? Period Weeks   ? Status On-going   ?  ? OT LONG TERM GOAL #4  ? Title Pt will increase LUE strength to 4+/5 or greater to improve ability to perform cooking tasks.   ? Time 8   ? Period Weeks   ? Status On-going   ? ?  ?  ? ?  ? ? ? Plan - 07/24/21 1326   ? ? Clinical Impression Statement A: manual  techniques completed to address fascial restrictions. Patient able to complete all A/ROM exercises supine with VC for form and technique. Rest breaks taken due to soreness and muscle fatigue.   ? Body Structure /

## 2021-07-24 NOTE — Patient Instructions (Signed)

## 2021-07-29 ENCOUNTER — Ambulatory Visit: Payer: Medicare Other | Admitting: Orthopaedic Surgery

## 2021-07-29 ENCOUNTER — Encounter: Payer: Self-pay | Admitting: Orthopaedic Surgery

## 2021-07-29 ENCOUNTER — Ambulatory Visit (HOSPITAL_COMMUNITY): Payer: Medicare Other

## 2021-07-29 ENCOUNTER — Other Ambulatory Visit: Payer: Self-pay

## 2021-07-29 ENCOUNTER — Encounter (HOSPITAL_COMMUNITY): Payer: Self-pay

## 2021-07-29 DIAGNOSIS — M25512 Pain in left shoulder: Secondary | ICD-10-CM | POA: Diagnosis not present

## 2021-07-29 DIAGNOSIS — S42255D Nondisplaced fracture of greater tuberosity of left humerus, subsequent encounter for fracture with routine healing: Secondary | ICD-10-CM | POA: Diagnosis not present

## 2021-07-29 DIAGNOSIS — R29898 Other symptoms and signs involving the musculoskeletal system: Secondary | ICD-10-CM

## 2021-07-29 DIAGNOSIS — M25552 Pain in left hip: Secondary | ICD-10-CM

## 2021-07-29 DIAGNOSIS — M25612 Stiffness of left shoulder, not elsewhere classified: Secondary | ICD-10-CM

## 2021-07-29 NOTE — Therapy (Signed)
?OUTPATIENT OCCUPATIONAL THERAPY TREATMENT NOTE ? ? ?Patient Name: [name] ?MRN: [id number] ?DOB:November 19, 1940, 81 y.o., female ?Today's Date: 07/29/2021 ? ?PCP: Kathyrn Drown, MD ?REFERRING PROVIDER: Dr. Sanjuana Kava ? ?Progress Note ?Reporting Period 07/02/21 to 07/29/21 ? ?See note below for Objective Data and Assessment of Progress/Goals.  ? ?  ? ? OT End of Session - 07/29/21 1308   ? ? Visit Number 7   ? Number of Visits 16   ? Date for OT Re-Evaluation 08/31/21   ? Authorization Type [employer] Medicare, $20 copay   ? Authorization Time Period no visit limit   ? Progress Note Due on Visit 17   ? OT Start Time 1300   ? OT Stop Time 1338   ? OT Time Calculation (min) 38 min   ? Activity Tolerance Patient tolerated treatment well   ? Behavior During Therapy [facility] for tasks assessed/performed   ? ?  ?  ? ?  ? ? ? ?Past Medical History:  ?Diagnosis Date  ? Adenocarcinoma, colon ([place]) 03/2005  ? Stage I; resected in 03/2005  ? Arthritis   ? Colon cancer ([place])   ? Removed surgicaly   ? Gastroesophageal reflux disease   ? Gastroparesis   ? Hearing loss   ? mild  ? Hyperlipidemia   ? Irregular heartbeat   ? Ovarian cyst 2008  ? Paroxysmal atrial fibrillation (HCC)   ? Palpitations; maintained on flecainide; -normal coronary angiography and 1999; negative stress nuclear study in 11/2005  ? PONV (postoperative nausea and vomiting)   ? Tobacco abuse, in remission   ? 10-pack-years; quit in 2001  ? ?Past Surgical History:  ?Procedure Laterality Date  ? ABDOMINAL HYSTERECTOMY  1983  ? BACK SURGERY    ? BIOPSY  10/18/2017  ? Procedure: BIOPSY;  Surgeon: [name], MD;  [facility];  Service: Endoscopy;;  gastric  ? BIOPSY  07/06/2018  ? Procedure: BIOPSY;  Surgeon: [name], MD;  [facility];  Service: Endoscopy;;  gastric  ? CHOLECYSTECTOMY    ? COLON SURGERY  2006  ? Colon cancer  ? COLONOSCOPY  03/25/2012  ? [name], MD  ? COLONOSCOPY N/A 04/26/2017  ? Procedure: COLONOSCOPY;   Surgeon: [name], MD;  [facility];  Service: Endoscopy;  Laterality: N/A;  2:00  ? COLONOSCOPY N/A 07/06/2018  ? Procedure: COLONOSCOPY;  Surgeon: [name], MD;  [facility];  Service: Endoscopy;  Laterality: N/A;  10:30  ? COSMETIC SURGERY  09/2007  ? ESOPHAGOGASTRODUODENOSCOPY N/A 10/18/2017  ? Procedure: ESOPHAGOGASTRODUODENOSCOPY (EGD);  Surgeon: [name], MD;  [facility];  Service: Endoscopy;  Laterality: N/A;  ? ESOPHAGOGASTRODUODENOSCOPY N/A 07/06/2018  ? Procedure: ESOPHAGOGASTRODUODENOSCOPY (EGD);  Surgeon: [name], MD;  [facility];  Service: Endoscopy;  Laterality: N/A;  ? GIVENS CAPSULE STUDY N/A 12/26/2019  ? Procedure: GIVENS CAPSULE STUDY;  Surgeon: [name], MD;  [facility];  Service: Endoscopy;  Laterality: N/A;  730  ? PARTIAL COLECTOMY  2006  ? adenocarcinoma  ? POLYPECTOMY  04/26/2017  ? Procedure: POLYPECTOMY;  Surgeon: [name], MD;  [facility];  Service: Endoscopy;;  colon  ? POLYPECTOMY  07/06/2018  ? Procedure: POLYPECTOMY;  Surgeon: [name], MD;  [facility];  Service: Endoscopy;;  colon ?  ? TONSILLECTOMY    ? ?Patient Active Problem List  ? Diagnosis Date  Noted  ? Encounter for monitoring opioid maintenance therapy 03/26/2020  ? Loss of weight 01/23/2019  ? Adnexal mass 01/13/2019  ? Chronic pain syndrome 11/08/2018  ? Abnormal CT of the abdomen 06/23/2018  ? Gastric wall thickening 06/23/2018  ? History of colonic polyps 04/27/2018  ? History of colon cancer 04/27/2018  ? Family hx of colon cancer 04/27/2018  ? Early satiety 09/20/2017  ? Malignant neoplasm of colon ([place]) 01/21/2017  ? Sciatica of right side 03/06/2015  ? Knee pain, left 03/13/2014  ? Pain in joint, ankle and foot 03/13/2014  ? Muscle weakness (generalized) 01/10/2014  ? Difficulty in walking(719.7) 01/10/2014  ? 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 Sciatica  of left side 04/26/2013  ? Chest pain at rest 03/21/2013  ? Prediabetes 12/30/2012  ? Paroxysmal atrial fibrillation (HCC)   ? ADENOCARCINOMA, COLON 06/24/2009  ? GASTROESOPHAGEAL REFLUX DISEASE 06/24/2009  ? Hyperlipidemia 06/12/2009  ? ? ?ONSET DATE: 05/26/21 ? ?REFERRING DIAG: s/p left humerus fx of greater tuberosity  ? ?THERAPY DIAG:  ?Other symptoms and signs involving the musculoskeletal system ? ?Acute pain of left shoulder ? ?Stiffness of left shoulder, not elsewhere classified ? ? ?PERTINENT HISTORY: Pt is a 81 y/o female s/p fall on 05/26/21 and sustaining a left humerus fx of greater tuberosity ? ?PRECAUTIONS: None; progress as tolerated ? ?SUBJECTIVE: S: I don't get much sleep at night. I didn't before this happened. I still can't sleep on this arm ? ? ?PAIN:  ?Are you having pain? Yes: NPRS scale: 4/10 ?Pain location: left shoulder ?Pain description: sore ?Aggravating factors: unknown ?Relieving factors: prescription pain medication ? ?Left shoulder A/ROM - assessed standing IR/er adducted ? Evaluation 07/29/21  ? flexion 82 130  ?abduction 66 116  ?Internal rotation 90 90  ?External rotation 50 70  ? ?Left shoulder P/ROM - assessed supine IR/er adducted ? Evaluation 07/29/21  ? flexion 90 120  ?abduction 95 165  ?Internal rotation 90 90  ?External rotation 42 75  ? ?Left shoulder MMT - assessed standing IR/er adducted. ?Strength not assessed prior to this date due to pain on evaluation. ? 07/29/21  ? flexion 4+/5  ?abduction 5/5  ?Internal rotation 5/5  ?External rotation 4-/5  ? ?OBJECTIVE:  ? ?TODAY'S TREATMENT:  ?07/29/21 ?-- P/ROM-shoulder flexion, abduction, er/IR, horizontal ab/adduction, 5X each ?- Proximal shoulder strengthening in standing, 12X each, no rest breaks ?-High level reaching: Squigz placed on door then removed using LUE ? ? ?07/24/21 ?-P/ROM-shoulder flexion, abduction, er/IR, horizontal ab/adduction, 5X each ?-A/ROM-shoulder protraction, er/IR, horizontal ab/adduction, abduction,  flexion 10X each, supine ?  -Manual therapy: myofascial release and manual stretching completed to left upper arm, upper trapezius, and scapularis region.  ?  -Proximal shoulder strengthening in supine, 10X each, no rest breaks ?  -High level reaching: Squigz placed on door then removed using LUE ? ?    ? ? ? ? ? ? OT Short Term Goals  ? ?  ? OT SHORT TERM GOAL #1  ? Title Patient will be educated and independent with HEP to increase mobility required for LUE use.   ? Time 4   ? Period Weeks   ? Status Met  ? Target Date 08/01/21   ?  ? OT SHORT TERM GOAL #2  ? Title Pt will increase LUE P/ROM to [facility] to increase ability to perform dressing tasks with minimal compensatory strategies.   ? Time 4   ? Period Weeks   ? Status Met  ?  ?  OT SHORT TERM GOAL #3  ? Title Patient will increase RUE strength to 3/5 to be able to complete reaching tasks from waist to chest height during ADLs.   ? Time 4   ? Period Weeks   ? Status Met  ? ?  ?  ? ?  ? ? ? OT Long Term Goals   ? ?  ? OT LONG TERM GOAL #1  ? Title Pt will decrease LUE pain to 3/10 or less to improve ability to sleep in position of comfort without waking due to pain.   ? Time 8   ? Period Weeks   ? Status On-going   ? Target Date 08/31/21   ?  ? OT LONG TERM GOAL #2  ? Title Pt will decrease LUE fascial restrictions to min amounts or less to improve ability to perform functional reaching tasks.   ? Time 8   ? Period Weeks   ? Status Partially Met  ?  ? OT LONG TERM GOAL #3  ? Title Pt will increase LUE A/ROM to [facility] to improve ability to fix her hair using LUE as assist.   ? Time 8   ? Period Weeks   ? Status Partially Met  ?  ? OT LONG TERM GOAL #4  ? Title Pt will increase LUE strength to 4+/5 or greater to improve ability to perform cooking tasks.   ? Time 8   ? Period Weeks   ? Status Partially Met  ? ?  ?  ? ?  ? ? ? Plan - 07/24/21 1326   ? ? Clinical Impression Statement A: Mini reassessment completed this date. Patient has met all STGs and partially met 3  LTGs. She reports noticing improvement with ROM, strength, and pain. Continues to have difficulty with shoulder strength and stability and has a difficult time reaching above shoulder level and maintaining her range o

## 2021-07-29 NOTE — Progress Notes (Signed)
My hip is better and so is my shoulder. ? ?She has been to OT for the left shoulder.  She is better.  She was just seen and I have reviewed the notes.   ? ?Her hip is less painful after the prednisone. ? ?ROM of the left shoulder is improved.  NV intact. ? ?ROM of the left hip is full.   ? ?Encounter Diagnoses  ?Name Primary?  ? Nondisplaced fracture of greater tuberosity of left humerus, subsequent encounter for fracture with routine healing Yes  ? Pain in left hip   ? ?Continue OT. ? ?I will see her in three weeks. ? ?Call if any problem. ? ?Precautions discussed. ? ?Electronically Signed ?Sanjuana Kava, MD ?3/21/20232:47 PM ? ?

## 2021-07-31 ENCOUNTER — Other Ambulatory Visit: Payer: Self-pay

## 2021-07-31 ENCOUNTER — Encounter (HOSPITAL_COMMUNITY): Payer: Self-pay | Admitting: Occupational Therapy

## 2021-07-31 ENCOUNTER — Ambulatory Visit (HOSPITAL_COMMUNITY): Payer: Medicare Other | Admitting: Occupational Therapy

## 2021-07-31 DIAGNOSIS — M25512 Pain in left shoulder: Secondary | ICD-10-CM | POA: Diagnosis not present

## 2021-07-31 DIAGNOSIS — R29898 Other symptoms and signs involving the musculoskeletal system: Secondary | ICD-10-CM

## 2021-07-31 DIAGNOSIS — M25612 Stiffness of left shoulder, not elsewhere classified: Secondary | ICD-10-CM

## 2021-07-31 NOTE — Therapy (Signed)
?OUTPATIENT OCCUPATIONAL THERAPY TREATMENT NOTE ? ? ?Patient Name: [name] ?MRN: [id number] ?DOB:1940-08-13, 81 y.o., female ?Today's Date: 07/31/2021 ? ?PCP: Kathyrn Drown, MD ?REFERRING PROVIDER: Dr. Sanjuana Kava ? ? ? 07/31/21 1435  ?OT Visits / Re-Eval  ?Visit Number 8  ?Number of Visits 16  ?Date for OT Re-Evaluation 08/31/21  ?Authorization  ?Authorization Type [employer] Medicare, $20 copay  ?Authorization Time Period no visit limit  ?Progress Note Due on Visit 17  ?OT Time Calculation  ?OT Start Time 1350  ?OT Stop Time 1432  ?OT Time Calculation (min) 42 min  ?End of Session  ?Activity Tolerance Patient tolerated treatment well  ?Behavior During Therapy [facility] for tasks assessed/performed  ? ? ? ?Past Medical History:  ?Diagnosis Date  ? Adenocarcinoma, colon ([place]) 03/2005  ? Stage I; resected in 03/2005  ? Arthritis   ? Colon cancer ([place])   ? Removed surgicaly   ? Gastroesophageal reflux disease   ? Gastroparesis   ? Hearing loss   ? mild  ? Hyperlipidemia   ? Irregular heartbeat   ? Ovarian cyst 2008  ? Paroxysmal atrial fibrillation (HCC)   ? Palpitations; maintained on flecainide; -normal coronary angiography and 1999; negative stress nuclear study in 11/2005  ? PONV (postoperative nausea and vomiting)   ? Tobacco abuse, in remission   ? 10-pack-years; quit in 2001  ? ?Past Surgical History:  ?Procedure Laterality Date  ? ABDOMINAL HYSTERECTOMY  1983  ? BACK SURGERY    ? BIOPSY  10/18/2017  ? Procedure: BIOPSY;  Surgeon: [name], MD;  [facility];  Service: Endoscopy;;  gastric  ? BIOPSY  07/06/2018  ? Procedure: BIOPSY;  Surgeon: [name], MD;  [facility];  Service: Endoscopy;;  gastric  ? CHOLECYSTECTOMY    ? COLON SURGERY  2006  ? Colon cancer  ? COLONOSCOPY  03/25/2012  ? [name], MD  ? COLONOSCOPY N/A 04/26/2017  ? Procedure: COLONOSCOPY;  Surgeon: [name], MD;  [facility];  Service: Endoscopy;  Laterality: N/A;  2:00  ? COLONOSCOPY  N/A 07/06/2018  ? Procedure: COLONOSCOPY;  Surgeon: [name], MD;  [facility];  Service: Endoscopy;  Laterality: N/A;  10:30  ? COSMETIC SURGERY  09/2007  ? ESOPHAGOGASTRODUODENOSCOPY N/A 10/18/2017  ? Procedure: ESOPHAGOGASTRODUODENOSCOPY (EGD);  Surgeon: [name], MD;  [facility];  Service: Endoscopy;  Laterality: N/A;  ? ESOPHAGOGASTRODUODENOSCOPY N/A 07/06/2018  ? Procedure: ESOPHAGOGASTRODUODENOSCOPY (EGD);  Surgeon: [name], MD;  [facility];  Service: Endoscopy;  Laterality: N/A;  ? GIVENS CAPSULE STUDY N/A 12/26/2019  ? Procedure: GIVENS CAPSULE STUDY;  Surgeon: [name], MD;  [facility];  Service: Endoscopy;  Laterality: N/A;  730  ? PARTIAL COLECTOMY  2006  ? adenocarcinoma  ? POLYPECTOMY  04/26/2017  ? Procedure: POLYPECTOMY;  Surgeon: [name], MD;  [facility];  Service: Endoscopy;;  colon  ? POLYPECTOMY  07/06/2018  ? Procedure: POLYPECTOMY;  Surgeon: [name], MD;  [facility];  Service: Endoscopy;;  colon ?  ? TONSILLECTOMY    ? ?Patient Active Problem List  ? Diagnosis Date Noted  ? Encounter for monitoring opioid maintenance therapy 03/26/2020  ? Loss of weight 01/23/2019  ? Adnexal mass 01/13/2019  ? Chronic pain syndrome 11/08/2018  ? Abnormal CT of the abdomen 06/23/2018  ? Gastric wall thickening 06/23/2018  ? History  of colonic polyps 04/27/2018  ? History of colon cancer 04/27/2018  ? Family hx of colon cancer 04/27/2018  ? Early satiety 09/20/2017  ? Malignant neoplasm of colon ([place]) 01/21/2017  ? Sciatica of right side 03/06/2015  ? Knee pain, left 03/13/2014  ? Pain in joint, ankle and foot 03/13/2014  ? Muscle weakness (generalized) 01/10/2014  ? Difficulty in walking(719.7) 01/10/2014  ? 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 Sciatica of left side 04/26/2013  ? Chest pain at rest 03/21/2013  ? Prediabetes 12/30/2012  ? Paroxysmal atrial fibrillation  (HCC)   ? ADENOCARCINOMA, COLON 06/24/2009  ? GASTROESOPHAGEAL REFLUX DISEASE 06/24/2009  ? Hyperlipidemia 06/12/2009  ? ? ?ONSET DATE: 05/26/21 ? ?REFERRING DIAG: s/p left humerus fx of greater tuberosity  ? ?THERAPY DIAG:  ?Other symptoms and signs involving the musculoskeletal system ? ?Acute pain of left shoulder ? ?Stiffness of left shoulder, not elsewhere classified ? ? ?PERTINENT HISTORY: Pt is a 82 y/o female s/p fall on 05/26/21 and sustaining a left humerus fx of greater tuberosity ? ?PRECAUTIONS: None; progress as tolerated ? ?SUBJECTIVE: S: I cleaned my whole house. ? ? ?PAIN:  ?Are you having pain? Yes: NPRS scale: 5/10 ?Pain location: left shoulder ?Pain description: sore, throbbing ?Aggravating factors: unknown ?Relieving factors: prescription pain medication ? ? ? ?OBJECTIVE:  ? ? FOTO: 53/100 ? ?TODAY'S TREATMENT:  ?07/31/21 ?-P/ROM-shoulder flexion, abduction, er/IR, horizontal ab/adduction, 5X each ?-A/ROM supine and standing: protraction, flexion, horizontal abduction, er/IR, abduction, 12X each ?-Proximal shoulder strengthening in supine and standing, 12X each, no rest breaks ?-Overhead lacing: lacing from top down then unlacing from bottom up ?-Red scapular theraband: row, extension, 10X each ?-UBE: Level 1, 2' forward 2' reverse, pace: 4.5 ? ? ?07/29/21 ?-- P/ROM-shoulder flexion, abduction, er/IR, horizontal ab/adduction, 5X each ?- Proximal shoulder strengthening in standing, 12X each, no rest breaks ?-High level reaching: Squigz placed on door then removed using LUE ? ? ?07/24/21 ?-P/ROM-shoulder flexion, abduction, er/IR, horizontal ab/adduction, 5X each ?-A/ROM-shoulder protraction, er/IR, horizontal ab/adduction, abduction, flexion 10X each, supine ?  -Manual therapy: myofascial release and manual stretching completed to left upper arm, upper trapezius, and scapularis region.  ?  -Proximal shoulder strengthening in supine, 10X each, no rest breaks ?  -High level reaching: Squigz placed on  door then removed using LUE ? ?    ? EDUCATION: ?  -3/23: A/ROM ? ? ? ? ? OT Short Term Goals  ? ?  ? OT SHORT TERM GOAL #1  ? Title Patient will be educated and independent with HEP to increase mobility required for LUE use.   ? Time 4   ? Period Weeks   ? Status Met  ? Target Date 08/01/21   ?  ? OT SHORT TERM GOAL #2  ? Title Pt will increase LUE P/ROM to [facility] to increase ability to perform dressing tasks with minimal compensatory strategies.   ? Time 4   ? Period Weeks   ? Status Met  ?  ? OT SHORT TERM GOAL #3  ? Title Patient will increase RUE strength to 3/5 to be able to complete reaching tasks from waist to chest height during ADLs.   ? Time 4   ? Period Weeks   ? Status Met  ? ?  ?  ? ?  ? ? ? OT Long Term Goals   ? ?  ? OT LONG TERM GOAL #1  ? Title Pt will decrease LUE pain to 3/10 or less  to improve ability to sleep in position of comfort without waking due to pain.   ? Time 8   ? Period Weeks   ? Status On-going   ? Target Date 08/31/21   ?  ? OT LONG TERM GOAL #2  ? Title Pt will decrease LUE fascial restrictions to min amounts or less to improve ability to perform functional reaching tasks.   ? Time 8   ? Period Weeks   ? Status Partially Met  ?  ? OT LONG TERM GOAL #3  ? Title Pt will increase LUE A/ROM to [facility] to improve ability to fix her hair using LUE as assist.   ? Time 8   ? Period Weeks   ? Status Partially Met  ?  ? OT LONG TERM GOAL #4  ? Title Pt will increase LUE strength to 4+/5 or greater to improve ability to perform cooking tasks.   ? Time 8   ? Period Weeks   ? Status Partially Met  ? ?  ?  ? ?  ? ? ? Plan  ? ? Clinical Impression Statement A: Pt reports MD is pleased with her progress, did not take new x-rays. Continued with A/ROM and proximal shoulder strengthening in supine and standing. Added scapular theraband and overhead lacing today. Verbal cuing for form and technique.   ? Body Structure / Function / Physical Skills ADL;Endurance;Muscle spasms;UE functional use;Fascial  restriction;Pain;ROM;IADL;Strength   ? Plan P: Follow up on HEP, continue with shoulder A/ROM, attempt ball on wall, X to V arms  ? OT Home Exercise Plan eval: table slides; 3/9: AA/ROM 3/16: A/ROM   ? Consulte

## 2021-07-31 NOTE — Patient Instructions (Signed)

## 2021-08-01 ENCOUNTER — Other Ambulatory Visit (INDEPENDENT_AMBULATORY_CARE_PROVIDER_SITE_OTHER): Payer: Self-pay

## 2021-08-01 DIAGNOSIS — Z800 Family history of malignant neoplasm of digestive organs: Secondary | ICD-10-CM

## 2021-08-01 DIAGNOSIS — Z85038 Personal history of other malignant neoplasm of large intestine: Secondary | ICD-10-CM

## 2021-08-01 DIAGNOSIS — Z86010 Personal history of colonic polyps: Secondary | ICD-10-CM

## 2021-08-04 ENCOUNTER — Encounter (INDEPENDENT_AMBULATORY_CARE_PROVIDER_SITE_OTHER): Payer: Self-pay | Admitting: *Deleted

## 2021-08-04 ENCOUNTER — Telehealth (INDEPENDENT_AMBULATORY_CARE_PROVIDER_SITE_OTHER): Payer: Self-pay | Admitting: *Deleted

## 2021-08-04 ENCOUNTER — Telehealth: Payer: Self-pay

## 2021-08-04 ENCOUNTER — Encounter (HOSPITAL_COMMUNITY): Payer: Medicare Other

## 2021-08-04 MED ORDER — PEG 3350-KCL-NA BICARB-NACL 420 G PO SOLR: 4000.0000 mL | Freq: Once | ORAL | 0 refills | Status: AC

## 2021-08-04 NOTE — Telephone Encounter (Signed)
? ?  Pre-operative Risk Assessment  ?  ?Patient Name: [name]  ?DOB: March 30, 1941 ?MRN: [id number]  ? ?  ? ?Request for Surgical Clearance   ? ?Procedure:   COLONOSCOPY ? ?Date of Surgery:  Clearance 09/10/21                              ?   ?Surgeon:  DR. Laural Golden ?Surgeon's Group or Practice Name:  [facility]  ?Phone number:  [phone] ?Fax number:  [phone] ?  ?Type of Clearance Requested:   ?- Pharmacy:  Hold Apixaban (Eliquis) 2 DAYS PRIOR TO PROCEDURE ?  ?Type of Anesthesia:  MAC ?  ?Additional requests/questions:   ? ?Signed, ?Jacinta Shoe   ?08/04/2021, 5:21 PM  ? ?

## 2021-08-04 NOTE — Telephone Encounter (Signed)
Patient needs trilyte 

## 2021-08-05 ENCOUNTER — Other Ambulatory Visit (HOSPITAL_COMMUNITY): Payer: Self-pay | Admitting: Family Medicine

## 2021-08-05 ENCOUNTER — Encounter (HOSPITAL_COMMUNITY): Payer: Medicare Other | Admitting: Occupational Therapy

## 2021-08-05 DIAGNOSIS — Z1231 Encounter for screening mammogram for malignant neoplasm of breast: Secondary | ICD-10-CM

## 2021-08-05 NOTE — Telephone Encounter (Signed)
? ?  Name: [name]  ?DOB: 07-30-1940  ?MRN: [id number] ? ?Primary Cardiologist: Carlyle Dolly, MD ? ?Chart reviewed as part of pre-operative protocol coverage. Because of [name]'s past medical history and time since last visit, she will require a follow-up visit in order to better assess preoperative cardiovascular risk. ? ?At last OV 02/2021 it was recommended to f/u in 6 months (08/2021). There were also phone notes 06/2021 documenting issues of hypotension requiring titration of midodrine back up. Although colonoscopy is relatively low risk, this procedure is not scheduled until May therefore makes sense to have OV to ensure OK to clear for procedure as planned. Do not currently see the 6 month f/u scheduled. ? ?Pre-op covering staff: ?- Please schedule appointment and call patient to inform them. ?- Please contact requesting surgeon's office via preferred method (i.e, phone, fax) to inform them of need for appointment prior to surgery. ? ?This message will also be routed to pharmacy pool  for input on holding anticoagulant/antiplatelet agent as requested below so that this information is available to the clearing provider at time of patient's appointment.  ? ?Charlie Pitter, PA-C  ?08/05/2021, 12:04 PM  ? ?

## 2021-08-05 NOTE — Telephone Encounter (Signed)
Patient with diagnosis of A Fib on Eliquis for anticoagulation.   ? ?Procedure: COLONOSCOPY ?Date of procedure: 09/10/21 ? ? ?CHA2DS2-VASc Score = 3  ?This indicates a 3.2% annual risk of stroke. ?The patient's score is based upon: ?CHF History: 0 ?HTN History: 0 ?Diabetes History: 0 ?Stroke History: 0 ?Vascular Disease History: 0 ?Age Score: 2 ?Gender Score: 1 ?  ? ?CrCl 34 mL/min ?Platelet count 292K ? ?Suggest updating lab work when 6 month follow is scheduled as results are from May 2022 ? ?Per office protocol, patient can hold Eliquis for 2 days prior to procedure.   ?

## 2021-08-05 NOTE — Telephone Encounter (Addendum)
Noted, see appt request below, already routed to callback staff. Finalized clearance including anticoagulation recs will be sent by provider seeing patient at upcoming visit. Will need BMET at that visit as well, can be ordered by provider, please put in appt notes when appt made. ?

## 2021-08-06 ENCOUNTER — Encounter (HOSPITAL_COMMUNITY): Payer: Self-pay | Admitting: Occupational Therapy

## 2021-08-06 ENCOUNTER — Ambulatory Visit (HOSPITAL_COMMUNITY): Payer: Medicare Other | Admitting: Occupational Therapy

## 2021-08-06 DIAGNOSIS — M25612 Stiffness of left shoulder, not elsewhere classified: Secondary | ICD-10-CM | POA: Diagnosis not present

## 2021-08-06 DIAGNOSIS — M25512 Pain in left shoulder: Secondary | ICD-10-CM

## 2021-08-06 DIAGNOSIS — R29898 Other symptoms and signs involving the musculoskeletal system: Secondary | ICD-10-CM

## 2021-08-06 NOTE — Therapy (Signed)
?OUTPATIENT OCCUPATIONAL THERAPY TREATMENT NOTE ? ? ?Patient Name: [name] ?MRN: [id number] ?DOB:08-05-1940, 81 y.o., female ?Today's Date: 08/06/2021 ? ?PCP: Kathyrn Drown, MD ?REFERRING PROVIDER: Dr. Sanjuana Kava ? ? ?   ?OT Visits / Re-Eval  ?Visit Number 9  ?Number of Visits 16  ?Date for OT Re-Evaluation 08/31/21  ?Authorization  ?Authorization Type [employer] Medicare, $20 copay  ?Authorization Time Period no visit limit  ?Progress Note Due on Visit 17  ?OT Time Calculation  ?OT Start Time 1345  ?OT Stop Time 1423  ?OT Time Calculation (min) 38 min  ?End of Session  ?Activity Tolerance Patient tolerated treatment well  ?Behavior During Therapy [facility] for tasks assessed/performed  ? ? ? ?Past Medical History:  ?Diagnosis Date  ? Adenocarcinoma, colon ([place]) 03/2005  ? Stage I; resected in 03/2005  ? Arthritis   ? Colon cancer ([place])   ? Removed surgicaly   ? Gastroesophageal reflux disease   ? Gastroparesis   ? Hearing loss   ? mild  ? Hyperlipidemia   ? Irregular heartbeat   ? Ovarian cyst 2008  ? Paroxysmal atrial fibrillation (HCC)   ? Palpitations; maintained on flecainide; -normal coronary angiography and 1999; negative stress nuclear study in 11/2005  ? PONV (postoperative nausea and vomiting)   ? Tobacco abuse, in remission   ? 10-pack-years; quit in 2001  ? ?Past Surgical History:  ?Procedure Laterality Date  ? ABDOMINAL HYSTERECTOMY  1983  ? BACK SURGERY    ? BIOPSY  10/18/2017  ? Procedure: BIOPSY;  Surgeon: [name], MD;  [facility];  Service: Endoscopy;;  gastric  ? BIOPSY  07/06/2018  ? Procedure: BIOPSY;  Surgeon: [name], MD;  [facility];  Service: Endoscopy;;  gastric  ? CHOLECYSTECTOMY    ? COLON SURGERY  2006  ? Colon cancer  ? COLONOSCOPY  03/25/2012  ? [name], MD  ? COLONOSCOPY N/A 04/26/2017  ? Procedure: COLONOSCOPY;  Surgeon: [name], MD;  [facility];  Service: Endoscopy;  Laterality: N/A;  2:00  ? COLONOSCOPY N/A  07/06/2018  ? Procedure: COLONOSCOPY;  Surgeon: [name], MD;  [facility];  Service: Endoscopy;  Laterality: N/A;  10:30  ? COSMETIC SURGERY  09/2007  ? ESOPHAGOGASTRODUODENOSCOPY N/A 10/18/2017  ? Procedure: ESOPHAGOGASTRODUODENOSCOPY (EGD);  Surgeon: [name], MD;  [facility];  Service: Endoscopy;  Laterality: N/A;  ? ESOPHAGOGASTRODUODENOSCOPY N/A 07/06/2018  ? Procedure: ESOPHAGOGASTRODUODENOSCOPY (EGD);  Surgeon: [name], MD;  [facility];  Service: Endoscopy;  Laterality: N/A;  ? GIVENS CAPSULE STUDY N/A 12/26/2019  ? Procedure: GIVENS CAPSULE STUDY;  Surgeon: [name], MD;  [facility];  Service: Endoscopy;  Laterality: N/A;  730  ? PARTIAL COLECTOMY  2006  ? adenocarcinoma  ? POLYPECTOMY  04/26/2017  ? Procedure: POLYPECTOMY;  Surgeon: [name], MD;  [facility];  Service: Endoscopy;;  colon  ? POLYPECTOMY  07/06/2018  ? Procedure: POLYPECTOMY;  Surgeon: [name], MD;  [facility];  Service: Endoscopy;;  colon ?  ? TONSILLECTOMY    ? ?Patient Active Problem List  ? Diagnosis Date Noted  ? Encounter for monitoring opioid maintenance therapy 03/26/2020  ? Loss of weight 01/23/2019  ? Adnexal mass 01/13/2019  ? Chronic pain syndrome 11/08/2018  ? Abnormal CT of the abdomen 06/23/2018  ? Gastric wall thickening 06/23/2018  ? History of  colonic polyps 04/27/2018  ? History of colon cancer 04/27/2018  ? Family hx of colon cancer 04/27/2018  ? Early satiety 09/20/2017  ? Malignant neoplasm of colon ([place]) 01/21/2017  ? Sciatica of right side 03/06/2015  ? Knee pain, left 03/13/2014  ? Pain in joint, ankle and foot 03/13/2014  ? Muscle weakness (generalized) 01/10/2014  ? Difficulty in walking(719.7) 01/10/2014  ? 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 Sciatica of left side 04/26/2013  ? Chest pain at rest 03/21/2013  ? Prediabetes 12/30/2012  ? Paroxysmal atrial fibrillation (HCC)    ? ADENOCARCINOMA, COLON 06/24/2009  ? GASTROESOPHAGEAL REFLUX DISEASE 06/24/2009  ? Hyperlipidemia 06/12/2009  ? ? ?ONSET DATE: 05/26/21 ? ?REFERRING DIAG: s/p left humerus fx of greater tuberosity  ? ?THERAPY DIAG:  ?Other symptoms and signs involving the musculoskeletal system ? ?Acute pain of left shoulder ? ?Stiffness of left shoulder, not elsewhere classified ? ? ?PERTINENT HISTORY: Pt is a 81 y/o female s/p fall on 05/26/21 and sustaining a left humerus fx of greater tuberosity ? ?PRECAUTIONS: None; progress as tolerated ? ?SUBJECTIVE: S: I don't see where it's any better or any worse.  ? ? ?PAIN:  ?Are you having pain? Yes: NPRS scale: 5/10 ?Pain location: left shoulder ?Pain description: sore, throbbing ?Aggravating factors: unknown ?Relieving factors: prescription pain medication ? ? ? ?OBJECTIVE:  ? ? FOTO: 53/100 ? ?TODAY'S TREATMENT:  ?  08/06/21 ?-A/ROM seated: protraction, flexion, horizontal abduction, er/IR, abduction, 12X each ?-Proximal shoulder strengthening in sitting, 12X each, no rest breaks ?-X to V arms, 10X  ?-Ball on Wall, 1' flexion 1' abduction ?-Scapular theraband: red, extension, row, retraction 10X each ?-UBE: level 1, 3' forward 3' reverse, pace: 4.5 ? ? ?  07/31/21 ?-P/ROM-shoulder flexion, abduction, er/IR, horizontal ab/adduction, 5X each ?-A/ROM supine and standing: protraction, flexion, horizontal abduction, er/IR, abduction, 12X each ?-Proximal shoulder strengthening in supine and standing, 12X each, no rest breaks ?-Overhead lacing: lacing from top down then unlacing from bottom up ?-Red scapular theraband: row, extension, 10X each ?-UBE: Level 1, 2' forward 2' reverse, pace: 4.5 ? ? ?07/29/21 ?-- P/ROM-shoulder flexion, abduction, er/IR, horizontal ab/adduction, 5X each ?- Proximal shoulder strengthening in standing, 12X each, no rest breaks ?-High level reaching: Squigz placed on door then removed using LUE ? ? ? ? ?    ? EDUCATION: ?  -3/23: A/ROM ? ? ? ? ? OT Short Term Goals   ? ?  ? OT SHORT TERM GOAL #1  ? Title Patient will be educated and independent with HEP to increase mobility required for LUE use.   ? Time 4   ? Period Weeks   ? Status Met  ? Target Date 08/01/21   ?  ? OT SHORT TERM GOAL #2  ? Title Pt will increase LUE P/ROM to [facility] to increase ability to perform dressing tasks with minimal compensatory strategies.   ? Time 4   ? Period Weeks   ? Status Met  ?  ? OT SHORT TERM GOAL #3  ? Title Patient will increase RUE strength to 3/5 to be able to complete reaching tasks from waist to chest height during ADLs.   ? Time 4   ? Period Weeks   ? Status Met  ? ?  ?  ? ?  ? ? ? OT Long Term Goals   ? ?  ? OT LONG TERM GOAL #1  ? Title Pt will decrease LUE pain to 3/10 or less to  improve ability to sleep in position of comfort without waking due to pain.   ? Time 8   ? Period Weeks   ? Status On-going   ? Target Date 08/31/21   ?  ? OT LONG TERM GOAL #2  ? Title Pt will decrease LUE fascial restrictions to min amounts or less to improve ability to perform functional reaching tasks.   ? Time 8   ? Period Weeks   ? Status Partially Met  ?  ? OT LONG TERM GOAL #3  ? Title Pt will increase LUE A/ROM to [facility] to improve ability to fix her hair using LUE as assist.   ? Time 8   ? Period Weeks   ? Status Partially Met  ?  ? OT LONG TERM GOAL #4  ? Title Pt will increase LUE strength to 4+/5 or greater to improve ability to perform cooking tasks.   ? Time 8   ? Period Weeks   ? Status Partially Met  ? ?  ?  ? ?  ? ? ? Plan  ? ? Clinical Impression Statement A:  Continued with A/ROM and proximal shoulder strengthening in sitting today. Completed functional reaching task, ball on wall, scapular theraband, and x to v arms today. Rest breaks as needed for fatigue. Verbal cuing for form and technique.   ? Body Structure / Function / Physical Skills ADL;Endurance;Muscle spasms;UE functional use;Fascial restriction;Pain;ROM;IADL;Strength   ? Plan P: Progress to strengthening using basketball or trial  green ball for therapy ball exercises.   ? OT Home Exercise Plan eval: table slides; 3/9: AA/ROM 3/16: A/ROM   ? Consulted and Agree with Plan of Care Patient   ? ?  ?  ? ?  ? ? ? ?Guadelupe Sabin, OTR/L  ?52

## 2021-08-07 ENCOUNTER — Ambulatory Visit: Payer: Medicare Other | Admitting: Student

## 2021-08-07 NOTE — Progress Notes (Signed)
? ?Cardiology Office Note   ? ?Date:  08/09/2021  ? ?ID:  [name], DOB [birth year], MRN [id number] ? ?PCP:  Kathyrn Drown, MD  ?Cardiologist: Carlyle Dolly, MD   ? ?Chief Complaint  ?Patient presents with  ? Pre-op Exam  ? ? ?History of Present Illness:   ? ?[name] is a 81 y.o. female with past medical history of palpitations (monitor in 08/2018 showing PAC's, PVC's and atrial tachycardia), remote history of atrial fibrillation (on Flecainide), orthostatic hypotension, anemia and HLD who presents to the office today for preoperative cardiac clearance. ? ?She was last examined by myself in 02/2021 and denied any recent anginal symptoms. Her dizziness had improved and she wished to have a dose reduction of Midodrine if possible. Therefore, her Midodrine was reduced from 5 mg TID to 5 mg BID.  ? ?Her PCP did notify us in 06/2021 she was having worsening dizziness and they had titrated her Midodrine back to TID dosing. In the interim, the office has received a preoperative cardiac clearance for upcoming colonoscopy and a follow-up visit was arranged to assess symptoms given recent medication adjustments. She was cleared by pharmacy to hold Eliquis for 2 days prior to her procedure ? ?In talking with the patient today, she reports she was overall doing well until earlier this week when she started to experience sinus congestion and upper respiratory issues. She did follow-up with her dentist as she was experiencing some swelling along her gumline and was started on Amoxicillin. She does have a follow-up with her next week.  She is still having an intermittent cough but says this is starting to improve.  She denies any specific exertional chest pain or palpitations. No recent dyspnea on exertion, orthopnea or PND. Her dizziness did resolve once Midodrine was titrated to TID dosing.  ? ?Past Medical History:  ?Diagnosis Date  ? Adenocarcinoma, colon ([place]) 03/2005  ? Stage I; resected in 03/2005  ? Arthritis    ? Colon cancer ([place])   ? Removed surgicaly   ? Gastroesophageal reflux disease   ? Gastroparesis   ? Hearing loss   ? mild  ? Hyperlipidemia   ? Irregular heartbeat   ? Ovarian cyst 2008  ? Paroxysmal atrial fibrillation (HCC)   ? Palpitations; maintained on flecainide; -normal coronary angiography and 1999; negative stress nuclear study in 11/2005  ? PONV (postoperative nausea and vomiting)   ? Tobacco abuse, in remission   ? 10-pack-years; quit in 2001  ? ? ?Past Surgical History:  ?Procedure Laterality Date  ? ABDOMINAL HYSTERECTOMY  1983  ? BACK SURGERY    ? BIOPSY  10/18/2017  ? Procedure: BIOPSY;  Surgeon: [name], MD;  [facility];  Service: Endoscopy;;  gastric  ? BIOPSY  07/06/2018  ? Procedure: BIOPSY;  Surgeon: [name], MD;  [facility];  Service: Endoscopy;;  gastric  ? CHOLECYSTECTOMY    ? COLON SURGERY  2006  ? Colon cancer  ? COLONOSCOPY  03/25/2012  ? [name], MD  ? COLONOSCOPY N/A 04/26/2017  ? Procedure: COLONOSCOPY;  Surgeon: [name], MD;  [facility];  Service: Endoscopy;  Laterality: N/A;  2:00  ? COLONOSCOPY N/A 07/06/2018  ? Procedure: COLONOSCOPY;  Surgeon: [name], MD;  [facility];  Service: Endoscopy;  Laterality: N/A;  10:30  ? COSMETIC SURGERY  09/2007  ? ESOPHAGOGASTRODUODENOSCOPY N/A 10/18/2017  ? Procedure: ESOPHAGOGASTRODUODENOSCOPY (EGD);  Surgeon: Laural Golden,  Mechele Dawley, MD;  [facility];  Service: Endoscopy;  Laterality: N/A;  ? ESOPHAGOGASTRODUODENOSCOPY N/A 07/06/2018  ? Procedure: ESOPHAGOGASTRODUODENOSCOPY (EGD);  Surgeon: [name], MD;  [facility];  Service: Endoscopy;  Laterality: N/A;  ? GIVENS CAPSULE STUDY N/A 12/26/2019  ? Procedure: GIVENS CAPSULE STUDY;  Surgeon: [name], MD;  [facility];  Service: Endoscopy;  Laterality: N/A;  730  ? PARTIAL COLECTOMY  2006  ? adenocarcinoma  ? POLYPECTOMY  04/26/2017  ? Procedure: POLYPECTOMY;   Surgeon: [name], MD;  [facility];  Service: Endoscopy;;  colon  ? POLYPECTOMY  07/06/2018  ? Procedure: POLYPECTOMY;  Surgeon: [name], MD;  [facility];  Service: Endoscopy;;  colon ?  ? TONSILLECTOMY    ? ? ?Current Medications: ?Outpatient Medications Prior to Visit  ?Medication Sig Dispense Refill  ? alendronate (FOSAMAX) 70 MG tablet Take 1 tablet (70 mg total) by mouth every 7 (seven) days. Take with a full glass of water on an empty stomach. 4 tablet 11  ? Calcium Carbonate (CALCIUM 600 PO) Take by mouth.    ? Ferrous Sulfate (IRON PO) Take by mouth. 1 tab once a day    ? HYDROcodone-acetaminophen (NORCO) 10-325 MG tablet Take 1 tablet by mouth 3 (three) times daily as needed. 90 tablet 0  ? HYDROcodone-acetaminophen (NORCO) 10-325 MG tablet 1 taken 3 times daily as needed for chronic pain 90 tablet 0  ? HYDROcodone-acetaminophen (NORCO) 10-325 MG tablet 1 taken 3 times daily as needed for pain 90 tablet 0  ? lactulose (CHRONULAC) 10 GM/15ML solution Take 20 g by mouth at bedtime.    ? midodrine (PROAMATINE) 5 MG tablet Take 1 tablet (5 mg total) by mouth 3 (three) times daily with meals. 270 tablet 2  ? omeprazole (PRILOSEC) 20 MG capsule Take 20 mg by mouth daily before breakfast.     ? rosuvastatin (CRESTOR) 40 MG tablet Take 1 tablet (40 mg total) by mouth daily. 90 tablet 3  ? vitamin B-12 (CYANOCOBALAMIN) 1000 MCG tablet Take 1,000 mcg by mouth daily.    ? apixaban (ELIQUIS) 5 MG TABS tablet Take 1 tablet (5 mg total) by mouth 2 (two) times daily. 60 tablet 11  ? flecainide (TAMBOCOR) 100 MG tablet TAKE 1 TABLET BY MOUTH EVERY 12 HOURS 60 tablet 0  ? ?No facility-administered medications prior to visit.  ?  ? ?Allergies:   Patient has no known allergies.  ? ?Social History  ? ?Socioeconomic History  ? Marital status: Married  ?  Spouse name: Not on file  ? Number of children: Not on file  ? Years of education: Not on file  ? Highest education level: Not on file   ?Occupational History  ? Occupation: Radiation protection practitioner  ?Tobacco Use  ? Smoking status: Former  ?  Packs/day: 0.30  ?  Years: 40.00  ?  Pack years: 12.00  ?  Types: Cigarettes  ?  Start date: 02/23/1963  ?  Quit date: 03/25/2002  ?  Years since quitting: 19.3  ? Smokeless tobacco: Never  ? Tobacco comments:  ?  Quit smoking in 2001  ?Vaping Use  ? Vaping Use: Never used  ?Substance and Sexual Activity  ? Alcohol use: No  ?  Alcohol/week: 0.0 standard drinks  ? Drug use: No  ? Sexual activity: Yes  ?  Birth control/protection: Surgical  ?Other Topics Concern  ? Not on file  ?Social History  Narrative  ? Lives with husband in a one story home.  Has 3 children.  Retired from Medical sales representative work.  Education: some college.  ? ?Social Determinants of Health  ? ?Financial Resource Strain: Not on file  ?Food Insecurity: Not on file  ?Transportation Needs: Not on file  ?Physical Activity: Not on file  ?Stress: Not on file  ?Social Connections: Not on file  ?  ? ?Family History:  The patient's family history includes Cancer in her brother and father; Healthy in her son; Heart attack in her mother; Heart disease in her father and mother; Heart failure in her sister; Hypertension in her father; Stroke in her mother and sister.  ? ?Review of Systems:   ? ?Please see the history of present illness.    ? ?All other systems reviewed and are otherwise negative except as noted above. ? ? ?Physical Exam:   ? ?VS:  BP 114/62   Pulse 65   Ht '5\' 2"'$  (1.575 m)   Wt 106 lb (48.1 kg)   SpO2 97%   BMI 19.39 kg/m?    ?General: Well developed, well nourished,female appearing in no acute distress. ?Head: Normocephalic, atraumatic. ?Neck: No carotid bruits. JVD not elevated.  ?Lungs: Respirations regular and unlabored, without wheezes or rales.  ?Heart: Regular rate and rhythm. No S3 or S4.  No murmur, no rubs, or gallops appreciated. ?Abdomen: Appears non-distended. No obvious abdominal masses. ?Msk:  Strength and tone appear normal for age. No obvious  joint deformities or effusions. ?Extremities: No clubbing or cyanosis. No pitting edema.  Distal pedal pulses are 2+ bilaterally. ?Neuro: Alert and oriented X 3. Moves all extremities spontaneously. No focal d

## 2021-08-08 ENCOUNTER — Ambulatory Visit: Payer: Medicare Other | Admitting: Student

## 2021-08-08 ENCOUNTER — Encounter: Payer: Self-pay | Admitting: Student

## 2021-08-08 VITALS — BP 114/62 | HR 65 | Ht 62.0 in | Wt 106.0 lb

## 2021-08-08 DIAGNOSIS — I951 Orthostatic hypotension: Secondary | ICD-10-CM | POA: Diagnosis not present

## 2021-08-08 DIAGNOSIS — I480 Paroxysmal atrial fibrillation: Secondary | ICD-10-CM

## 2021-08-08 DIAGNOSIS — E785 Hyperlipidemia, unspecified: Secondary | ICD-10-CM | POA: Diagnosis not present

## 2021-08-08 DIAGNOSIS — Z01810 Encounter for preprocedural cardiovascular examination: Secondary | ICD-10-CM | POA: Diagnosis not present

## 2021-08-08 MED ORDER — APIXABAN 5 MG PO TABS: 5.0000 mg | ORAL_TABLET | Freq: Two times a day (BID) | ORAL | 3 refills | Status: DC

## 2021-08-08 MED ORDER — FLECAINIDE ACETATE 100 MG PO TABS: 100.0000 mg | ORAL_TABLET | Freq: Two times a day (BID) | ORAL | 3 refills | Status: DC

## 2021-08-08 NOTE — Patient Instructions (Signed)
Medication Instructions:  ? ?You can take PLAIN MUCINEX if needed.  ? ?Hold Eliquis for 2 days prior to your colonoscopy.  ? ? ?*If you need a refill on your cardiac medications before your next appointment, please call your pharmacy* ? ? ?Lab Work: ? ?I would recommend having your blood work checked prior to your colonoscopy. Orders are in the system (previously entered by Dr. Wolfgang Phoenix) ? ?Follow-Up: ?At [facility], you and your health needs are our priority.  As part of our continuing mission to provide you with exceptional heart care, we have created designated Provider Care Teams.  These Care Teams include your primary Cardiologist (physician) and Advanced Practice Providers (APPs -  Physician Assistants and Nurse Practitioners) who all work together to provide you with the care you need, when you need it. ? ?We recommend signing up for the patient portal called "MyChart".  Sign up information is provided on this After Visit Summary.  MyChart is used to connect with patients for Virtual Visits (Telemedicine).  Patients are able to view lab/test results, encounter notes, upcoming appointments, etc.  Non-urgent messages can be sent to your provider as well.   ?To learn more about what you can do with MyChart, go to [email or web address].   ? ?Your next appointment:   ?6 month(s) ? ?The format for your next appointment:   ?In Person ? ?Provider:   ?You may see Carlyle Dolly, MD or one of the following Advanced Practice Providers on your designated Care Team:   ?Bernerd Pho, PA-C  ?Ermalinda Barrios, PA-C { ? ? ?

## 2021-08-11 ENCOUNTER — Other Ambulatory Visit: Payer: Self-pay | Admitting: Family Medicine

## 2021-08-11 ENCOUNTER — Telehealth (INDEPENDENT_AMBULATORY_CARE_PROVIDER_SITE_OTHER): Payer: Self-pay | Admitting: *Deleted

## 2021-08-11 NOTE — Telephone Encounter (Signed)
Per Bernerd Pho, PA ([place]) patient can Hold Eliquis for 2 days prior to your colonoscopy - patient aware to hold ?

## 2021-08-12 ENCOUNTER — Ambulatory Visit (HOSPITAL_COMMUNITY): Payer: Medicare Other | Attending: Orthopaedic Surgery | Admitting: Occupational Therapy

## 2021-08-12 ENCOUNTER — Encounter (HOSPITAL_COMMUNITY): Payer: Self-pay | Admitting: Occupational Therapy

## 2021-08-12 DIAGNOSIS — R29898 Other symptoms and signs involving the musculoskeletal system: Secondary | ICD-10-CM | POA: Diagnosis not present

## 2021-08-12 DIAGNOSIS — M25512 Pain in left shoulder: Secondary | ICD-10-CM

## 2021-08-12 DIAGNOSIS — M25612 Stiffness of left shoulder, not elsewhere classified: Secondary | ICD-10-CM

## 2021-08-12 NOTE — Therapy (Signed)
?OUTPATIENT OCCUPATIONAL THERAPY TREATMENT NOTE ? ? ?Patient Name: [name] ?MRN: [id number] ?DOB:July 16, 1940, 81 y.o., female ?Today's Date: 08/12/2021 ? ?PCP: Kathyrn Drown, MD ?REFERRING PROVIDER: Dr. Sanjuana Kava ? ? ? 08/12/21 1607  ?OT Visits / Re-Eval  ?Visit Number 10  ?Number of Visits 16  ?Date for OT Re-Evaluation 08/31/21  ?Authorization  ?Authorization Type [employer] Medicare, $20 copay  ?Authorization Time Period no visit limit  ?Progress Note Due on Visit 17  ?OT Time Calculation  ?OT Start Time 1102  ?OT Stop Time 1556  ?OT Time Calculation (min) 38 min  ?End of Session  ?Activity Tolerance Patient tolerated treatment well  ?Behavior During Therapy [facility] for tasks assessed/performed  ? ? ?Past Medical History:  ?Diagnosis Date  ? Adenocarcinoma, colon ([place]) 03/2005  ? Stage I; resected in 03/2005  ? Arthritis   ? Colon cancer ([place])   ? Removed surgicaly   ? Gastroesophageal reflux disease   ? Gastroparesis   ? Hearing loss   ? mild  ? Hyperlipidemia   ? Irregular heartbeat   ? Ovarian cyst 2008  ? Paroxysmal atrial fibrillation (HCC)   ? Palpitations; maintained on flecainide; -normal coronary angiography and 1999; negative stress nuclear study in 11/2005  ? PONV (postoperative nausea and vomiting)   ? Tobacco abuse, in remission   ? 10-pack-years; quit in 2001  ? ?Past Surgical History:  ?Procedure Laterality Date  ? ABDOMINAL HYSTERECTOMY  1983  ? BACK SURGERY    ? BIOPSY  10/18/2017  ? Procedure: BIOPSY;  Surgeon: [name], MD;  [facility];  Service: Endoscopy;;  gastric  ? BIOPSY  07/06/2018  ? Procedure: BIOPSY;  Surgeon: [name], MD;  [facility];  Service: Endoscopy;;  gastric  ? CHOLECYSTECTOMY    ? COLON SURGERY  2006  ? Colon cancer  ? COLONOSCOPY  03/25/2012  ? [name], MD  ? COLONOSCOPY N/A 04/26/2017  ? Procedure: COLONOSCOPY;  Surgeon: [name], MD;  [facility];  Service: Endoscopy;  Laterality: N/A;  2:00  ? COLONOSCOPY  N/A 07/06/2018  ? Procedure: COLONOSCOPY;  Surgeon: [name], MD;  [facility];  Service: Endoscopy;  Laterality: N/A;  10:30  ? COSMETIC SURGERY  09/2007  ? ESOPHAGOGASTRODUODENOSCOPY N/A 10/18/2017  ? Procedure: ESOPHAGOGASTRODUODENOSCOPY (EGD);  Surgeon: [name], MD;  [facility];  Service: Endoscopy;  Laterality: N/A;  ? ESOPHAGOGASTRODUODENOSCOPY N/A 07/06/2018  ? Procedure: ESOPHAGOGASTRODUODENOSCOPY (EGD);  Surgeon: [name], MD;  [facility];  Service: Endoscopy;  Laterality: N/A;  ? GIVENS CAPSULE STUDY N/A 12/26/2019  ? Procedure: GIVENS CAPSULE STUDY;  Surgeon: [name], MD;  [facility];  Service: Endoscopy;  Laterality: N/A;  730  ? PARTIAL COLECTOMY  2006  ? adenocarcinoma  ? POLYPECTOMY  04/26/2017  ? Procedure: POLYPECTOMY;  Surgeon: [name], MD;  [facility];  Service: Endoscopy;;  colon  ? POLYPECTOMY  07/06/2018  ? Procedure: POLYPECTOMY;  Surgeon: [name], MD;  [facility];  Service: Endoscopy;;  colon ?  ? TONSILLECTOMY    ? ?Patient Active Problem List  ? Diagnosis Date Noted  ? Encounter for monitoring opioid maintenance therapy 03/26/2020  ? Loss of weight 01/23/2019  ? Adnexal mass 01/13/2019  ? Chronic pain syndrome 11/08/2018  ? Abnormal CT of the abdomen 06/23/2018  ? Gastric wall thickening 06/23/2018  ? History of  colonic polyps 04/27/2018  ? History of colon cancer 04/27/2018  ? Family hx of colon cancer 04/27/2018  ? Early satiety 09/20/2017  ? Malignant neoplasm of colon ([place]) 01/21/2017  ? Sciatica of right side 03/06/2015  ? Knee pain, left 03/13/2014  ? Pain in joint, ankle and foot 03/13/2014  ? Muscle weakness (generalized) 01/10/2014  ? Difficulty in walking(719.7) 01/10/2014  ? 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 Sciatica of left side 04/26/2013  ? Chest pain at rest 03/21/2013  ? Prediabetes 12/30/2012  ? Paroxysmal atrial fibrillation  (HCC)   ? ADENOCARCINOMA, COLON 06/24/2009  ? GASTROESOPHAGEAL REFLUX DISEASE 06/24/2009  ? Hyperlipidemia 06/12/2009  ? ? ?ONSET DATE: 05/26/21 ? ?REFERRING DIAG: s/p left humerus fx of greater tuberosity  ? ?THERAPY DIAG:  ?No diagnosis found. ? ? ?PERTINENT HISTORY: Pt is a 81 y/o female s/p fall on 05/26/21 and sustaining a left humerus fx of greater tuberosity ? ?PRECAUTIONS: None; progress as tolerated ? ?SUBJECTIVE: S: I have blood pressure issues.  ? ? ?PAIN:  ?Are you having pain? Yes: NPRS scale: 4 /10 ?Pain location: left shoulder ?Pain description: nagging  ?Aggravating factors: unknown ?Relieving factors: prescription pain medication ? ? ? ?OBJECTIVE:  ? ? FOTO: 53/100 ? ?TODAY'S TREATMENT:  ?   ?  08/12/21 ?  -A/ROM seated: protraction, flexion, horizontal abduction, er/IR, abduction, 12X each ?  -AA/ROM x10 abduction and flexion, seated. ?  -Proximal shoulder strengthening in sitting, 12X each, no rest breaks ?-Strengthening with basketball seated: protraction, flexion, horizontal abduction, er/IR, x10 each.  ?-X to V arms, 2x5 reps with 1/2# wrist weight.  ?-Ball on Wall, 1' flexion 1' abduction ?-UBE: level 1, 2:40' forward 5.5 pace. Stopped at 2:40 due to pt complaint of dizziness.  ? ?  08/06/21 ?-A/ROM seated: protraction, flexion, horizontal abduction, er/IR, abduction, 12X each ?-Proximal shoulder strengthening in sitting, 12X each, no rest breaks ?-X to V arms, 10X  ?-Ball on Wall, 1' flexion 1' abduction ?-Scapular theraband: red, extension, row, retraction 10X each ?-UBE: level 1, 3' forward 3' reverse, pace: 4.5 ? ? ?  07/31/21 ?-P/ROM-shoulder flexion, abduction, er/IR, horizontal ab/adduction, 5X each ?-A/ROM supine and standing: protraction, flexion, horizontal abduction, er/IR, abduction, 12X each ?-Proximal shoulder strengthening in supine and standing, 12X each, no rest breaks ?-Overhead lacing: lacing from top down then unlacing from bottom up ?-Red scapular theraband: row, extension, 10X  each ?-UBE: Level 1, 2' forward 2' reverse, pace: 4.5 ? ? ?07/29/21 ?-- P/ROM-shoulder flexion, abduction, er/IR, horizontal ab/adduction, 5X each ?- Proximal shoulder strengthening in standing, 12X each, no rest breaks ?-High level reaching: Squigz placed on door then removed using LUE ? ? ? ? ?    ? EDUCATION: ?  -3/23: A/ROM ? ? ? ? ? OT Short Term Goals  ? ?  ? OT SHORT TERM GOAL #1  ? Title Patient will be educated and independent with HEP to increase mobility required for LUE use.   ? Time 4   ? Period Weeks   ? Status Met  ? Target Date 08/01/21   ?  ? OT SHORT TERM GOAL #2  ? Title Pt will increase LUE P/ROM to [facility] to increase ability to perform dressing tasks with minimal compensatory strategies.   ? Time 4   ? Period Weeks   ? Status Met  ?  ? OT SHORT TERM GOAL #3  ? Title Patient will increase RUE strength to 3/5 to be able to complete reaching  tasks from waist to chest height during ADLs.   ? Time 4   ? Period Weeks   ? Status Met  ? ?  ?  ? ?  ? ? ? OT Long Term Goals   ? ?  ? OT LONG TERM GOAL #1  ? Title Pt will decrease LUE pain to 3/10 or less to improve ability to sleep in position of comfort without waking due to pain.   ? Time 8   ? Period Weeks   ? Status On-going   ? Target Date 08/31/21   ?  ? OT LONG TERM GOAL #2  ? Title Pt will decrease LUE fascial restrictions to min amounts or less to improve ability to perform functional reaching tasks.   ? Time 8   ? Period Weeks   ? Status Partially Met  ?  ? OT LONG TERM GOAL #3  ? Title Pt will increase LUE A/ROM to [facility] to improve ability to fix her hair using LUE as assist.   ? Time 8   ? Period Weeks   ? Status Partially Met  ?  ? OT LONG TERM GOAL #4  ? Title Pt will increase LUE strength to 4+/5 or greater to improve ability to perform cooking tasks.   ? Time 8   ? Period Weeks   ? Status Partially Met  ? ?  ?  ? ?  ? ? ? Plan  ? ? Clinical Impression Statement A:  Continued with A/ROM with AA/ROM for difficult motions of flexion and abduction.  Completed strengthening with basketball. This was followed with ball on wall and ending with UBE bike. Pt reported intermittent dizziness throughout session with need to stop UBE bike due to dizziness. S

## 2021-08-14 ENCOUNTER — Ambulatory Visit (HOSPITAL_COMMUNITY): Payer: Medicare Other | Admitting: Occupational Therapy

## 2021-08-14 ENCOUNTER — Encounter (HOSPITAL_COMMUNITY): Payer: Self-pay | Admitting: Occupational Therapy

## 2021-08-14 DIAGNOSIS — M25512 Pain in left shoulder: Secondary | ICD-10-CM

## 2021-08-14 DIAGNOSIS — R29898 Other symptoms and signs involving the musculoskeletal system: Secondary | ICD-10-CM

## 2021-08-14 DIAGNOSIS — M25612 Stiffness of left shoulder, not elsewhere classified: Secondary | ICD-10-CM

## 2021-08-14 NOTE — Therapy (Signed)
?OUTPATIENT OCCUPATIONAL THERAPY TREATMENT NOTE ? ? ?Patient Name: [name] ?MRN: [id number] ?DOB:1941/01/08, 81 y.o., female ?Today's Date: 08/14/2021 ? ?PCP: Kathyrn Drown, MD ?REFERRING PROVIDER: Dr. Sanjuana Kava ? ? ? 08/14/2021  ?OT Visits / Re-Eval  ?Visit Number 11  ?Number of Visits 16  ?Date for OT Re-Evaluation 08/31/21  ?Authorization  ?Authorization Type [employer] Medicare, $20 copay  ?Authorization Time Period no visit limit  ?Progress Note Due on Visit 18  ?OT Time Calculation  ?OT Start Time 1342  ?OT Stop Time 1415  ?OT Time Calculation (min) 33 min  ?End of Session  ?Activity Tolerance Patient tolerated treatment well  ?Behavior During Therapy [facility] for tasks assessed/performed  ? ? ?Past Medical History:  ?Diagnosis Date  ? Adenocarcinoma, colon ([place]) 03/2005  ? Stage I; resected in 03/2005  ? Arthritis   ? Colon cancer ([place])   ? Removed surgicaly   ? Gastroesophageal reflux disease   ? Gastroparesis   ? Hearing loss   ? mild  ? Hyperlipidemia   ? Irregular heartbeat   ? Ovarian cyst 2008  ? Paroxysmal atrial fibrillation (HCC)   ? Palpitations; maintained on flecainide; -normal coronary angiography and 1999; negative stress nuclear study in 11/2005  ? PONV (postoperative nausea and vomiting)   ? Tobacco abuse, in remission   ? 10-pack-years; quit in 2001  ? ?Past Surgical History:  ?Procedure Laterality Date  ? ABDOMINAL HYSTERECTOMY  1983  ? BACK SURGERY    ? BIOPSY  10/18/2017  ? Procedure: BIOPSY;  Surgeon: [name], MD;  [facility];  Service: Endoscopy;;  gastric  ? BIOPSY  07/06/2018  ? Procedure: BIOPSY;  Surgeon: [name], MD;  [facility];  Service: Endoscopy;;  gastric  ? CHOLECYSTECTOMY    ? COLON SURGERY  2006  ? Colon cancer  ? COLONOSCOPY  03/25/2012  ? [name], MD  ? COLONOSCOPY N/A 04/26/2017  ? Procedure: COLONOSCOPY;  Surgeon: [name], MD;  [facility];  Service: Endoscopy;  Laterality: N/A;  2:00  ? COLONOSCOPY N/A  07/06/2018  ? Procedure: COLONOSCOPY;  Surgeon: [name], MD;  [facility];  Service: Endoscopy;  Laterality: N/A;  10:30  ? COSMETIC SURGERY  09/2007  ? ESOPHAGOGASTRODUODENOSCOPY N/A 10/18/2017  ? Procedure: ESOPHAGOGASTRODUODENOSCOPY (EGD);  Surgeon: [name], MD;  [facility];  Service: Endoscopy;  Laterality: N/A;  ? ESOPHAGOGASTRODUODENOSCOPY N/A 07/06/2018  ? Procedure: ESOPHAGOGASTRODUODENOSCOPY (EGD);  Surgeon: [name], MD;  [facility];  Service: Endoscopy;  Laterality: N/A;  ? GIVENS CAPSULE STUDY N/A 12/26/2019  ? Procedure: GIVENS CAPSULE STUDY;  Surgeon: [name], MD;  [facility];  Service: Endoscopy;  Laterality: N/A;  730  ? PARTIAL COLECTOMY  2006  ? adenocarcinoma  ? POLYPECTOMY  04/26/2017  ? Procedure: POLYPECTOMY;  Surgeon: [name], MD;  [facility];  Service: Endoscopy;;  colon  ? POLYPECTOMY  07/06/2018  ? Procedure: POLYPECTOMY;  Surgeon: [name], MD;  [facility];  Service: Endoscopy;;  colon ?  ? TONSILLECTOMY    ? ?Patient Active Problem List  ? Diagnosis Date Noted  ? Encounter for monitoring opioid maintenance therapy 03/26/2020  ? Loss of weight 01/23/2019  ? Adnexal mass 01/13/2019  ? Chronic pain syndrome 11/08/2018  ? Abnormal CT of the abdomen 06/23/2018  ? Gastric wall thickening 06/23/2018  ? History of colonic  polyps 04/27/2018  ? History of colon cancer 04/27/2018  ? Family hx of colon cancer 04/27/2018  ? Early satiety 09/20/2017  ? Malignant neoplasm of colon ([place]) 01/21/2017  ? Sciatica of right side 03/06/2015  ? Knee pain, left 03/13/2014  ? Pain in joint, ankle and foot 03/13/2014  ? Muscle weakness (generalized) 01/10/2014  ? Difficulty in walking(719.7) 01/10/2014  ? 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 Sciatica of left side 04/26/2013  ? Chest pain at rest 03/21/2013  ? Prediabetes 12/30/2012  ? Paroxysmal atrial fibrillation (HCC)    ? ADENOCARCINOMA, COLON 06/24/2009  ? GASTROESOPHAGEAL REFLUX DISEASE 06/24/2009  ? Hyperlipidemia 06/12/2009  ? ? ?ONSET DATE: 05/26/21 ? ?REFERRING DIAG: s/p left humerus fx of greater tuberosity  ? ?THERAPY DIAG:  ?Other symptoms and signs involving the musculoskeletal system ? ?Acute pain of left shoulder ? ?Stiffness of left shoulder, not elsewhere classified ? ? ?PERTINENT HISTORY: Pt is a 81 y/o female s/p fall on 05/26/21 and sustaining a left humerus fx of greater tuberosity ? ?PRECAUTIONS: None; progress as tolerated ? ?SUBJECTIVE: S: I would say my arm is getting better.  ? ? ?PAIN:  ?Are you having pain? Yes: NPRS scale: 4 /10 ?Pain location: left shoulder ?Pain description: nagging  ?Aggravating factors: unknown ?Relieving factors: prescription pain medication ? ? ? ?OBJECTIVE:  ? ? FOTO: 53/100 ? ?TODAY'S TREATMENT:  ?  08/14/21 ?  -Shoulder strengthening: protraction, flexion, horizontal abduction, er/IR, abduction, 10X, 1# weight ?  -Therapy Ball strengthening: basketball-chest press, overhead press, flexion, circles each direction, 10X each ?  -Proximal shoulder strengthening: 10X each, no rest breaks, sitting ?  -Red Scapular theraband: row, extension, retraction, 10X ?  -Ball on wall: green ball, 1' flexion 1' abduction ?   ? ?  08/12/21 ?  -A/ROM seated: protraction, flexion, horizontal abduction, er/IR, abduction, 12X each ?  -AA/ROM x10 abduction and flexion, seated. ?  -Proximal shoulder strengthening in sitting, 12X each, no rest breaks ?-Strengthening with basketball seated: protraction, flexion, horizontal abduction, er/IR, x10 each.  ?-X to V arms, 2x5 reps with 1/2# wrist weight.  ?-Ball on Wall, 1' flexion 1' abduction ?-UBE: level 1, 2:40' forward 5.5 pace. Stopped at 2:40 due to pt complaint of dizziness.  ? ? ?  08/06/21 ?-A/ROM seated: protraction, flexion, horizontal abduction, er/IR, abduction, 12X each ?-Proximal shoulder strengthening in sitting, 12X each, no rest breaks ?-X to V  arms, 10X  ?-Ball on Wall, 1' flexion 1' abduction ?-Scapular theraband: red, extension, row, retraction 10X each ?-UBE: level 1, 3' forward 3' reverse, pace: 4.5 ? ? ?    ? EDUCATION: ?  -3/23: A/ROM ? ? ? ? ? OT Short Term Goals  ? ?  ? OT SHORT TERM GOAL #1  ? Title Patient will be educated and independent with HEP to increase mobility required for LUE use.   ? Time 4   ? Period Weeks   ? Status Met  ? Target Date 08/01/21   ?  ? OT SHORT TERM GOAL #2  ? Title Pt will increase LUE P/ROM to [facility] to increase ability to perform dressing tasks with minimal compensatory strategies.   ? Time 4   ? Period Weeks   ? Status Met  ?  ? OT SHORT TERM GOAL #3  ? Title Patient will increase RUE strength to 3/5 to be able to complete reaching tasks from waist to chest height during ADLs.   ? Time 4   ?  Period Weeks   ? Status Met  ? ?  ?  ? ?  ? ? ? OT Long Term Goals   ? ?  ? OT LONG TERM GOAL #1  ? Title Pt will decrease LUE pain to 3/10 or less to improve ability to sleep in position of comfort without waking due to pain.   ? Time 8   ? Period Weeks   ? Status On-going   ? Target Date 08/31/21   ?  ? OT LONG TERM GOAL #2  ? Title Pt will decrease LUE fascial restrictions to min amounts or less to improve ability to perform functional reaching tasks.   ? Time 8   ? Period Weeks   ? Status Partially Met  ?  ? OT LONG TERM GOAL #3  ? Title Pt will increase LUE A/ROM to [facility] to improve ability to fix her hair using LUE as assist.   ? Time 8   ? Period Weeks   ? Status Partially Met  ?  ? OT LONG TERM GOAL #4  ? Title Pt will increase LUE strength to 4+/5 or greater to improve ability to perform cooking tasks.   ? Time 8   ? Period Weeks   ? Status Partially Met  ? ?  ?  ? ?  ? ? ? Plan  ? ? Clinical Impression Statement A:  Progressed to strengthening in sitting using 1# weight. Continued with therapy ball work and added proximal shoulder strengthening with 1#.  Continued with scapular theraband, ball on wall today. Pt with  dizziness on standing, reports allergy like symptoms. Session ended early due to pt fatigue and dizziness. Verbal cuing for form and technique throughout session, rest breaks provided as needed.   ? Body Struc

## 2021-08-19 ENCOUNTER — Telehealth (INDEPENDENT_AMBULATORY_CARE_PROVIDER_SITE_OTHER): Payer: Self-pay | Admitting: *Deleted

## 2021-08-19 ENCOUNTER — Ambulatory Visit (HOSPITAL_COMMUNITY): Payer: Medicare Other | Admitting: Occupational Therapy

## 2021-08-19 ENCOUNTER — Encounter (HOSPITAL_COMMUNITY): Payer: Self-pay | Admitting: Occupational Therapy

## 2021-08-19 ENCOUNTER — Ambulatory Visit: Payer: Medicare Other | Admitting: Orthopaedic Surgery

## 2021-08-19 ENCOUNTER — Encounter: Payer: Self-pay | Admitting: Orthopaedic Surgery

## 2021-08-19 DIAGNOSIS — R29898 Other symptoms and signs involving the musculoskeletal system: Secondary | ICD-10-CM

## 2021-08-19 DIAGNOSIS — M25612 Stiffness of left shoulder, not elsewhere classified: Secondary | ICD-10-CM | POA: Diagnosis not present

## 2021-08-19 DIAGNOSIS — M25512 Pain in left shoulder: Secondary | ICD-10-CM | POA: Diagnosis not present

## 2021-08-19 DIAGNOSIS — S42255D Nondisplaced fracture of greater tuberosity of left humerus, subsequent encounter for fracture with routine healing: Secondary | ICD-10-CM

## 2021-08-19 NOTE — Telephone Encounter (Signed)
Referring MD/PCP: scott luking ? ?Procedure: tcs ? ?Reason/Indication:  hx polyps, hx colon ca, fam hx colon ca ? ?Has patient had this procedure before?  Yes, 06/2018 ? If so, when, by whom and where?   ? ?Is there a family history of colon cancer?  Yes, brother ? Who?  What age when diagnosed?   ? ?Is patient diabetic? If yes, Type 1 or Type 2   no ?     ?Does patient have prosthetic heart valve or mechanical valve?  no ? ?Do you have a pacemaker/defibrillator?  no ? ?Has patient ever had endocarditis/atrial fibrillation? Yes, a fib ? ?Does patient use oxygen? yes ? ?Has patient had joint replacement within last 12 months?  no ? ?Is patient constipated or do they take laxatives? yes ? ?Does patient have a history of alcohol/drug use?  no ? ?Have you had a stroke/heart attack last 6 mths? no ? ?Do you take medicine for weight loss?  no ? ?For female patients,: have you had a hysterectomy yes ?                     are you post menopausal no ?                     do you still have your menstrual cycle no ? ?Is patient on blood thinner such as Coumadin, Plavix and/or Aspirin? yes ? ?Medications: eliquis 5 mg bid, flecainide 100 mg bid, midodrine 5 mg tid, rosuvastatin 40 mg daily, alendronate 70 mg once a week, prilosec daily, hydrocodone 10/325 mg prn, Vit B12 1000 mcg daily, Vit C 500 mg daily, Vit D3 daily, Iron daiy ? ?Allergies: nkda ? ?Medication Adjustment per Dr Rehman/Dr Jenetta Downer Eliquis 2 days (see telephone note), Iron 10 days ? ?Procedure date & time: 09/10/21 ? ? ?

## 2021-08-19 NOTE — Therapy (Signed)
?OUTPATIENT OCCUPATIONAL THERAPY TREATMENT NOTE ? ? ?Patient Name: [name] ?MRN: [id number] ?DOB:February 20, 1941, 81 y.o., female ?Today's Date: 08/19/2021 ? ?PCP: Kathyrn Drown, MD ?REFERRING PROVIDER: Dr. Sanjuana Kava ? ? ? ? 08/19/21 1353  ?OT Visits / Re-Eval  ?Visit Number 12  ?Number of Visits 16  ?Date for OT Re-Evaluation 08/31/21  ?Authorization  ?Authorization Type [employer] Medicare, $20 copay  ?Authorization Time Period no visit limit  ?Progress Note Due on Visit 17  ?OT Time Calculation  ?OT Start Time 1300  ?OT Stop Time 1341  ?OT Time Calculation (min) 41 min  ?End of Session  ?Activity Tolerance Patient tolerated treatment well  ?Behavior During Therapy [facility] for tasks assessed/performed  ? ? ?Past Medical History:  ?Diagnosis Date  ? Adenocarcinoma, colon ([place]) 03/2005  ? Stage I; resected in 03/2005  ? Arthritis   ? Colon cancer ([place])   ? Removed surgicaly   ? Gastroesophageal reflux disease   ? Gastroparesis   ? Hearing loss   ? mild  ? Hyperlipidemia   ? Irregular heartbeat   ? Ovarian cyst 2008  ? Paroxysmal atrial fibrillation (HCC)   ? Palpitations; maintained on flecainide; -normal coronary angiography and 1999; negative stress nuclear study in 11/2005  ? PONV (postoperative nausea and vomiting)   ? Tobacco abuse, in remission   ? 10-pack-years; quit in 2001  ? ?Past Surgical History:  ?Procedure Laterality Date  ? ABDOMINAL HYSTERECTOMY  1983  ? BACK SURGERY    ? BIOPSY  10/18/2017  ? Procedure: BIOPSY;  Surgeon: [name], MD;  [facility];  Service: Endoscopy;;  gastric  ? BIOPSY  07/06/2018  ? Procedure: BIOPSY;  Surgeon: [name], MD;  [facility];  Service: Endoscopy;;  gastric  ? CHOLECYSTECTOMY    ? COLON SURGERY  2006  ? Colon cancer  ? COLONOSCOPY  03/25/2012  ? [name], MD  ? COLONOSCOPY N/A 04/26/2017  ? Procedure: COLONOSCOPY;  Surgeon: [name], MD;  [facility];  Service: Endoscopy;  Laterality: N/A;  2:00  ?  COLONOSCOPY N/A 07/06/2018  ? Procedure: COLONOSCOPY;  Surgeon: [name], MD;  [facility];  Service: Endoscopy;  Laterality: N/A;  10:30  ? COSMETIC SURGERY  09/2007  ? ESOPHAGOGASTRODUODENOSCOPY N/A 10/18/2017  ? Procedure: ESOPHAGOGASTRODUODENOSCOPY (EGD);  Surgeon: [name], MD;  [facility];  Service: Endoscopy;  Laterality: N/A;  ? ESOPHAGOGASTRODUODENOSCOPY N/A 07/06/2018  ? Procedure: ESOPHAGOGASTRODUODENOSCOPY (EGD);  Surgeon: [name], MD;  [facility];  Service: Endoscopy;  Laterality: N/A;  ? GIVENS CAPSULE STUDY N/A 12/26/2019  ? Procedure: GIVENS CAPSULE STUDY;  Surgeon: [name], MD;  [facility];  Service: Endoscopy;  Laterality: N/A;  730  ? PARTIAL COLECTOMY  2006  ? adenocarcinoma  ? POLYPECTOMY  04/26/2017  ? Procedure: POLYPECTOMY;  Surgeon: [name], MD;  [facility];  Service: Endoscopy;;  colon  ? POLYPECTOMY  07/06/2018  ? Procedure: POLYPECTOMY;  Surgeon: [name], MD;  [facility];  Service: Endoscopy;;  colon ?  ? TONSILLECTOMY    ? ?Patient Active Problem List  ? Diagnosis Date Noted  ? Encounter for monitoring opioid maintenance therapy 03/26/2020  ? Loss of weight 01/23/2019  ? Adnexal mass 01/13/2019  ? Chronic pain syndrome 11/08/2018  ? Abnormal CT of the abdomen 06/23/2018  ? Gastric wall thickening 06/23/2018  ? History  of colonic polyps 04/27/2018  ? History of colon cancer 04/27/2018  ? Family hx of colon cancer 04/27/2018  ? Early satiety 09/20/2017  ? Malignant neoplasm of colon ([place]) 01/21/2017  ? Sciatica of right side 03/06/2015  ? Knee pain, left 03/13/2014  ? Pain in joint, ankle and foot 03/13/2014  ? Muscle weakness (generalized) 01/10/2014  ? Difficulty in walking(719.7) 01/10/2014  ? 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 Sciatica of left side 04/26/2013  ? Chest pain at rest 03/21/2013  ? Prediabetes 12/30/2012  ? Paroxysmal atrial  fibrillation (HCC)   ? ADENOCARCINOMA, COLON 06/24/2009  ? GASTROESOPHAGEAL REFLUX DISEASE 06/24/2009  ? Hyperlipidemia 06/12/2009  ? ? ?ONSET DATE: 05/26/21 ? ?REFERRING DIAG: s/p left humerus fx of greater tuberosity  ? ?THERAPY DIAG:  ?Other symptoms and signs involving the musculoskeletal system ? ?Stiffness of left shoulder, not elsewhere classified ? ?Acute pain of left shoulder ? ? ?PERTINENT HISTORY: Pt is a 81 y/o female s/p fall on 05/26/21 and sustaining a left humerus fx of greater tuberosity ? ?PRECAUTIONS: None; progress as tolerated ? ?SUBJECTIVE: S: I think I am about over this infection.   ? ? ?PAIN:  ?Are you having pain? Yes: NPRS scale: 3 /10 ?Pain location: left shoulder ?Pain description: nagging  ?Aggravating factors: unknown ?Relieving factors: prescription pain medication ? ? ? ?OBJECTIVE:  ? ? FOTO: 53/100 ? ?TODAY'S TREATMENT:  ?  08/19/21 ?-Shoulder strengthening: protraction, flexion, horizontal abduction, er/IR, abduction, 12X, 1# weight ?  -Therapy Ball strengthening: basketball-chest press, overhead press, flexion, circles each direction, 10X each ?  -Proximal shoulder strengthening: 10X each, no rest breaks, sitting ?  -Theraband strengthening: abduction, adduction, er/IR, flexion, horizontal abduction, extension. X10 reps standing with yellow band.  ?  -Placing and removing cones in cabinet overhead with 1# wrist weight.  ?  -UBE: level 1, 2' forward and reverse 5.5 pace. ? ?  08/14/21 ?  -Shoulder strengthening: protraction, flexion, horizontal abduction, er/IR, abduction, 10X, 1# weight ?  -Therapy Ball strengthening: red weighted ball-chest press, overhead press, flexion, circles each direction, 10X each ?  -Proximal shoulder strengthening: 10X each, no rest breaks, sitting ?  -Red Scapular theraband: row, extension, retraction, 10X ?  -Ball on wall: green ball, 1' flexion 1' abduction ?  ? ?  08/12/21 ?  -A/ROM seated: protraction, flexion, horizontal abduction, er/IR, abduction, 12X  each ?  -AA/ROM x10 abduction and flexion, seated. ?  -Proximal shoulder strengthening in sitting, 12X each, no rest breaks ?-Strengthening with basketball seated: protraction, flexion, horizontal abduction, er/IR, x10 each.  ?-X to V arms, 2x5 reps with 1/2# wrist weight.  ?-Ball on Wall, 1' flexion 1' abduction ?-UBE: level 1, 2:40' forward 5.5 pace. Stopped at 2:40 due to pt complaint of dizziness.  ? ? ?  08/06/21 ?-A/ROM seated: protraction, flexion, horizontal abduction, er/IR, abduction, 12X each ?-Proximal shoulder strengthening in sitting, 12X each, no rest breaks ?-X to V arms, 10X  ?-Ball on Wall, 1' flexion 1' abduction ?-Scapular theraband: red, extension, row, retraction 10X each ?-UBE: level 1, 3' forward 3' reverse, pace: 4.5 ? ? ?    ? EDUCATION:4/11: theraband strengthening of shoulder, HEP given with tactile and verbal cues; pt returned demonstration  ?  -3/23: A/ROM ? ? ? ? ? OT Short Term Goals  ? ?  ? OT SHORT TERM GOAL #1  ? Title Patient will be educated and independent with HEP to increase mobility required for LUE use.   ?  Time 4   ? Period Weeks   ? Status Met  ? Target Date 08/01/21   ?  ? OT SHORT TERM GOAL #2  ? Title Pt will increase LUE P/ROM to [facility] to increase ability to perform dressing tasks with minimal compensatory strategies.   ? Time 4   ? Period Weeks   ? Status Met  ?  ? OT SHORT TERM GOAL #3  ? Title Patient will increase RUE strength to 3/5 to be able to complete reaching tasks from waist to chest height during ADLs.   ? Time 4   ? Period Weeks   ? Status Met  ? ?  ?  ? ?  ? ? ? OT Long Term Goals   ? ?  ? OT LONG TERM GOAL #1  ? Title Pt will decrease LUE pain to 3/10 or less to improve ability to sleep in position of comfort without waking due to pain.   ? Time 8   ? Period Weeks   ? Status On-going   ? Target Date 08/31/21   ?  ? OT LONG TERM GOAL #2  ? Title Pt will decrease LUE fascial restrictions to min amounts or less to improve ability to perform functional reaching  tasks.   ? Time 8   ? Period Weeks   ? Status Partially Met  ?  ? OT LONG TERM GOAL #3  ? Title Pt will increase LUE A/ROM to [facility] to improve ability to fix her hair using LUE as assist.   ? Time 8   ? Period Week

## 2021-08-19 NOTE — Patient Instructions (Signed)
?  All exercises 10 to 15 reps 2 to 3 times a day.  ? ?1) PNF Strengthening: Resisted ?Strengthening: Resisted Abduction ? ? ?Hold tubing with right arm across body. Pull up and away from side. Move through pain-free range of motion. ?Repeat ____ times per set. Do ____ sets per session. Do ____ sessions per day. ? ?http://orth.exer.us/826  ? ?Copyright ? VHI. All rights reserved.  ? ? ?2) Strengthening: Resisted Adduction ? ? ?Hold tubing in left hand, arm out. Pull arm toward opposite hip. Do not twist or rotate trunk. ?Repeat ____ times per set. Do ____ sets per session. Do ____ sessions per day. ? ?http://orth.exer.us/834  ? ?Copyright ? VHI. All rights reserved.  ? ?3) Strengthening: Resisted Extension ? ? ?Hold tubing in right hand, arm forward. Pull arm back, elbow straight. ?Repeat ____ times per set. Do ____ sets per session. Do ____ sessions per day. ? ?http://orth.exer.us/832  ? ?Copyright ? VHI. All rights reserved.  ? ?4) Strengthening: Resisted External Rotation ? ? ?Hold tubing in right hand, elbow at side and forearm across body. Rotate forearm out. ?Repeat ____ times per set. Do ____ sets per session. Do ____ sessions per day. ? ?http://orth.exer.us/828  ? ?Copyright ? VHI. All rights reserved.  ? ?5) Strengthening: Resisted Flexion ? ? ?Hold tubing with left arm at side. Pull forward and up. Move shoulder through pain-free range of motion. ?Repeat ____ times per set. Do ____ sets per session. Do ____ sessions per day. ? ?http://orth.exer.us/824  ? ?Copyright ? VHI. All rights reserved.  ? ?6) Strengthening: Resisted Horizontal Abduction ? ? ?Hold tubing in right hand, elbow straight, arm in, parallel to floor. Pull arm out from side through pain-free range. ?Repeat ____ times per set. Do ____ sets per session. Do ____ sessions per day. ? ?http://orth.exer.us/836  ? ?Copyright ? VHI. All rights reserved.   ? ?

## 2021-08-19 NOTE — Progress Notes (Signed)
My shoulder is better. ? ?She has been going to OT and is making good progress for the left shoulder fracture.  I have read the OT notes.  I want her to continue.   ? ?Left shoulder has much better ROM but still has limited extension.  NV intact. ? ?Encounter Diagnosis  ?Name Primary?  ? Nondisplaced fracture of greater tuberosity of left humerus, subsequent encounter for fracture with routine healing Yes  ? ?Continue OT. ? ?Return in three weeks. ? ?Call if any problem. ? ?Precautions discussed. ? ?Electronically Signed ?Sanjuana Kava, MD ?4/11/20233:03 PM ? ?

## 2021-08-20 ENCOUNTER — Telehealth: Payer: Self-pay | Admitting: Family Medicine

## 2021-08-20 NOTE — Telephone Encounter (Signed)
Error

## 2021-08-21 ENCOUNTER — Encounter (HOSPITAL_COMMUNITY): Payer: Self-pay

## 2021-08-21 ENCOUNTER — Ambulatory Visit (HOSPITAL_COMMUNITY): Payer: Medicare Other

## 2021-08-21 DIAGNOSIS — R29898 Other symptoms and signs involving the musculoskeletal system: Secondary | ICD-10-CM | POA: Diagnosis not present

## 2021-08-21 DIAGNOSIS — M25612 Stiffness of left shoulder, not elsewhere classified: Secondary | ICD-10-CM

## 2021-08-21 DIAGNOSIS — M25512 Pain in left shoulder: Secondary | ICD-10-CM | POA: Diagnosis not present

## 2021-08-21 NOTE — Patient Instructions (Signed)
Complete the following exercises 1-2 times a day. ?  ?  ?  ?Internal Rotation Across Back ?  ?Grab the end of a towel with your affected side, palm facing backwards. Grab the towel with your unaffected side and pull your affected hand across your back until you feel a stretch in the front of your shoulder. If you feel pain, pull just to the pain, do not pull through the pain. Hold. Return your affected arm to your side. Try to keep your hand/arm close to your body during the entire movement.   ?  Hold for 20 seconds. Complete 2 times.    ?  ?  ?  ?1) Pectoralis Major release ? ?While putting pressure into your pec muscle with the tennis ball, extend your arm straight back. 5X ?  ? ?

## 2021-08-21 NOTE — Therapy (Signed)
?OUTPATIENT OCCUPATIONAL THERAPY TREATMENT NOTE ? ? ?Patient Name: [name] ?MRN: [id number] ?DOB:14-Dec-1940, 81 y.o., female ?Today's Date: 08/21/2021 ? ?PCP: Kathyrn Drown, MD ?REFERRING PROVIDER: Dr. Sanjuana Kava ? ?END OF SESSION: ? OT End of Session - 08/21/21 1409   ? ? Visit Number 13   ? Number of Visits 16   ? Date for OT Re-Evaluation 08/31/21   ? Authorization Type [employer] Medicare, $20 copay   ? Authorization Time Period no visit limit   ? Progress Note Due on Visit 17   ? OT Start Time 1345   ? Activity Tolerance Patient tolerated treatment well   ? Behavior During Therapy [facility] for tasks assessed/performed   ? ?  ?  ? ?  ? ? ?Past Medical History:  ?Diagnosis Date  ? Adenocarcinoma, colon ([place]) 03/2005  ? Stage I; resected in 03/2005  ? Arthritis   ? Colon cancer ([place])   ? Removed surgicaly   ? Gastroesophageal reflux disease   ? Gastroparesis   ? Hearing loss   ? mild  ? Hyperlipidemia   ? Irregular heartbeat   ? Ovarian cyst 2008  ? Paroxysmal atrial fibrillation (HCC)   ? Palpitations; maintained on flecainide; -normal coronary angiography and 1999; negative stress nuclear study in 11/2005  ? PONV (postoperative nausea and vomiting)   ? Tobacco abuse, in remission   ? 10-pack-years; quit in 2001  ? ?Past Surgical History:  ?Procedure Laterality Date  ? ABDOMINAL HYSTERECTOMY  1983  ? BACK SURGERY    ? BIOPSY  10/18/2017  ? Procedure: BIOPSY;  Surgeon: [name], MD;  [facility];  Service: Endoscopy;;  gastric  ? BIOPSY  07/06/2018  ? Procedure: BIOPSY;  Surgeon: [name], MD;  [facility];  Service: Endoscopy;;  gastric  ? CHOLECYSTECTOMY    ? COLON SURGERY  2006  ? Colon cancer  ? COLONOSCOPY  03/25/2012  ? [name], MD  ? COLONOSCOPY N/A 04/26/2017  ? Procedure: COLONOSCOPY;  Surgeon: [name], MD;  [facility];  Service: Endoscopy;  Laterality: N/A;  2:00  ? COLONOSCOPY N/A 07/06/2018  ? Procedure: COLONOSCOPY;  Surgeon: [name], MD;  [facility];  Service: Endoscopy;  Laterality: N/A;  10:30  ? COSMETIC SURGERY  09/2007  ? ESOPHAGOGASTRODUODENOSCOPY N/A 10/18/2017  ? Procedure: ESOPHAGOGASTRODUODENOSCOPY (EGD);  Surgeon: [name], MD;  [facility];  Service: Endoscopy;  Laterality: N/A;  ? ESOPHAGOGASTRODUODENOSCOPY N/A 07/06/2018  ? Procedure: ESOPHAGOGASTRODUODENOSCOPY (EGD);  Surgeon: [name], MD;  [facility];  Service: Endoscopy;  Laterality: N/A;  ? GIVENS CAPSULE STUDY N/A 12/26/2019  ? Procedure: GIVENS CAPSULE STUDY;  Surgeon: [name], MD;  [facility];  Service: Endoscopy;  Laterality: N/A;  730  ? PARTIAL COLECTOMY  2006  ? adenocarcinoma  ? POLYPECTOMY  04/26/2017  ? Procedure: POLYPECTOMY;  Surgeon: [name], MD;  [facility];  Service: Endoscopy;;  colon  ? POLYPECTOMY  07/06/2018  ? Procedure: POLYPECTOMY;  Surgeon: [name], MD;  [facility];  Service: Endoscopy;;  colon ?  ? TONSILLECTOMY    ? ?Patient Active Problem List  ? Diagnosis Date Noted  ? Encounter for monitoring opioid maintenance therapy 03/26/2020  ? Loss of weight 01/23/2019  ? Adnexal mass 01/13/2019  ? Chronic pain syndrome 11/08/2018  ? Abnormal CT of the abdomen 06/23/2018  ?  Gastric wall thickening 06/23/2018  ? History of colonic polyps 04/27/2018  ? History of colon cancer 04/27/2018  ? Family hx of colon cancer 04/27/2018  ? Early satiety 09/20/2017  ? Malignant neoplasm of colon ([place]) 01/21/2017  ? Sciatica of right side 03/06/2015  ? Knee pain, left 03/13/2014  ? Pain in joint, ankle and foot 03/13/2014  ? Muscle weakness (generalized) 01/10/2014  ? Difficulty in walking(719.7) 01/10/2014  ? 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 Sciatica of left side 04/26/2013  ? Chest pain at rest 03/21/2013  ? Prediabetes 12/30/2012  ? Paroxysmal atrial fibrillation (HCC)   ? ADENOCARCINOMA, COLON 06/24/2009  ?  GASTROESOPHAGEAL REFLUX DISEASE 06/24/2009  ? Hyperlipidemia 06/12/2009  ? ? ?ONSET DATE: 05/26/21 ? ?REFERRING DIAG: s/p left humerus fx of greater tuberosity  ? ?THERAPY DIAG:  ?Other symptoms and signs involving the musculoskeletal system ? ?Stiffness of left shoulder, not elsewhere classified ? ?Acute pain of left shoulder ? ? ?PERTINENT HISTORY: Pt is a 81 y/o female s/p fall on 05/26/21 and sustaining a left humerus fx of greater tuberosity ? ?PRECAUTIONS: None; progress as tolerated ? ?SUBJECTIVE: S: How long will the pain last? ? ? ?PAIN:  ?Are you having pain? Yes: NPRS scale: 2 /10 ?Pain location: left shoulder ?Pain description: nagging, Constant, sore ?Aggravating factors: unknown ?Relieving factors: prescription pain medication ? ? ? ?OBJECTIVE:  ? ? FOTO: 53/100 ? ?TODAY'S TREATMENT:  ? ?  08/21/21 ?- Manual therapy: completed prior to exercises. Soft tissue mobilization and manual trigger point release completed to left shoulder pectoralis major and bicep tendon.  ?- manual stretching: left shoulder supine, IR/er 5X ?- Stretching: shoulder, IR, horizontal with towel, 2x20" ?- Self pectoralis major release, anterior deltoid/bicep tendon release with tennis ball ? ? ?  08/19/21 ?-Shoulder strengthening: protraction, flexion, horizontal abduction, er/IR, abduction, 12X, 1# weight ?  -Therapy Ball strengthening: basketball-chest press, overhead press, flexion, circles each direction, 10X  ?  -Proximal shoulder strengthening: 10X each, no rest breaks, sitting ?-Theraband strengthening: abduction, adduction, er/IR, flexion, horizontal abduction, extension. X10 reps standing with yellow band.  ?  -Placing and removing cones in cabinet overhead with 1# wrist weight.  ?  -UBE: level 1, 2' forward and reverse 5.5 pace. ? ?  08/14/21 ?  -Shoulder strengthening: protraction, flexion, horizontal abduction, er/IR, abduction, 10X, 1# weight ?  -Therapy Ball strengthening: red weighted ball-chest press, overhead press,  flexion, circles each direction, 10X each ?  -Proximal shoulder strengthening: 10X each, no rest breaks, sitting ?  -Red Scapular theraband: row, extension, retraction, 10X ?  -Ball on wall: green ball, 1' flexion 1' abduction ?  ? ?   ? ? ?    ? Home Exercise Program: ?4/11: theraband strengthening of shoulder, HEP given with tactile and verbal cues; pt returned demonstration 4/13: Pectoralis major release with tennis ball, IR stretch ?   ?.PATIENT EDUCATION: ?Education details: self pectoralis major release with tennis ball, IR shoulder stretch. Stay hydrated. Use heat to help relax tight muscles. Use ice right after therapy session for 10' to help with pain management and any post session soreness.  ?Person educated: Patient ?Education method: Explanation, Demonstration, Tactile cues, Verbal cues, and Handouts ?Education comprehension: verbalized understanding, returned demonstration, and verbal cues required  ? ? ? ? ? OT Short Term Goals  ? ?  ? OT SHORT TERM GOAL #1  ? Title Patient will be educated and independent with HEP to increase mobility required for LUE use.   ?  Time 4   ? Period Weeks   ? Status   ? Target Date 08/01/21   ?  ? OT SHORT TERM GOAL #2  ? Title Pt will increase LUE P/ROM to [facility] to increase ability to perform dressing tasks with minimal compensatory strategies.   ? Time 4   ? Period Weeks   ? Status   ?  ? OT SHORT TERM GOAL #3  ? Title Patient will increase RUE strength to 3/5 to be able to complete reaching tasks from waist to chest height during ADLs.   ? Time 4   ? Period Weeks   ? Status   ? ?  ?  ? ?  ? ? ? OT Long Term Goals   ? ?  ? OT LONG TERM GOAL #1  ? Title Pt will decrease LUE pain to 3/10 or less to improve ability to sleep in position of comfort without waking due to pain.   ? Time 8   ? Period Weeks   ? Status On-going   ? Target Date 08/31/21   ?  ? OT LONG TERM GOAL #2  ? Title Pt will decrease LUE fascial restrictions to min amounts or less to improve ability to perform  functional reaching tasks.   ? Time 8   ? Period Weeks   ? Status Partially Met  ?  ? OT LONG TERM GOAL #3  ? Title Pt will increase LUE A/ROM to [facility] to improve ability to fix her hair using LUE as assist.   ? Time 8   ?

## 2021-08-26 ENCOUNTER — Encounter (HOSPITAL_COMMUNITY): Payer: Self-pay | Admitting: Occupational Therapy

## 2021-08-26 ENCOUNTER — Ambulatory Visit (HOSPITAL_COMMUNITY): Payer: Medicare Other | Admitting: Occupational Therapy

## 2021-08-26 DIAGNOSIS — M25512 Pain in left shoulder: Secondary | ICD-10-CM | POA: Diagnosis not present

## 2021-08-26 DIAGNOSIS — R29898 Other symptoms and signs involving the musculoskeletal system: Secondary | ICD-10-CM

## 2021-08-26 DIAGNOSIS — M25612 Stiffness of left shoulder, not elsewhere classified: Secondary | ICD-10-CM

## 2021-08-26 NOTE — Therapy (Signed)
?OUTPATIENT OCCUPATIONAL THERAPY TREATMENT NOTE ? ? ?Patient Name: [name] ?MRN: [id number] ?DOB:Sep 15, 1940, 81 y.o., female ?Today's Date: 08/26/2021 ? ?PCP: Kathyrn Drown, MD ?REFERRING PROVIDER: Dr. Sanjuana Kava ? ?END OF SESSION: ? OT End of Session - 08/26/21 1345   ? ? Visit Number 14   ? Number of Visits 16   ? Date for OT Re-Evaluation 08/31/21   ? Authorization Type [employer] Medicare, $20 copay   ? Authorization Time Period no visit limit   ? Progress Note Due on Visit 17   ? OT Start Time 1301   ? OT Stop Time 1342   ? OT Time Calculation (min) 41 min   ? Activity Tolerance Patient tolerated treatment well   ? Behavior During Therapy [facility] for tasks assessed/performed   ? ?  ?  ? ?  ? ? ? ?Past Medical History:  ?Diagnosis Date  ? Adenocarcinoma, colon ([place]) 03/2005  ? Stage I; resected in 03/2005  ? Arthritis   ? Colon cancer ([place])   ? Removed surgicaly   ? Gastroesophageal reflux disease   ? Gastroparesis   ? Hearing loss   ? mild  ? Hyperlipidemia   ? Irregular heartbeat   ? Ovarian cyst 2008  ? Paroxysmal atrial fibrillation (HCC)   ? Palpitations; maintained on flecainide; -normal coronary angiography and 1999; negative stress nuclear study in 11/2005  ? PONV (postoperative nausea and vomiting)   ? Tobacco abuse, in remission   ? 10-pack-years; quit in 2001  ? ?Past Surgical History:  ?Procedure Laterality Date  ? ABDOMINAL HYSTERECTOMY  1983  ? BACK SURGERY    ? BIOPSY  10/18/2017  ? Procedure: BIOPSY;  Surgeon: [name], MD;  [facility];  Service: Endoscopy;;  gastric  ? BIOPSY  07/06/2018  ? Procedure: BIOPSY;  Surgeon: [name], MD;  [facility];  Service: Endoscopy;;  gastric  ? CHOLECYSTECTOMY    ? COLON SURGERY  2006  ? Colon cancer  ? COLONOSCOPY  03/25/2012  ? [name], MD  ? COLONOSCOPY N/A 04/26/2017  ? Procedure: COLONOSCOPY;  Surgeon: [name], MD;  [facility];  Service: Endoscopy;  Laterality: N/A;  2:00  ? COLONOSCOPY  N/A 07/06/2018  ? Procedure: COLONOSCOPY;  Surgeon: [name], MD;  [facility];  Service: Endoscopy;  Laterality: N/A;  10:30  ? COSMETIC SURGERY  09/2007  ? ESOPHAGOGASTRODUODENOSCOPY N/A 10/18/2017  ? Procedure: ESOPHAGOGASTRODUODENOSCOPY (EGD);  Surgeon: [name], MD;  [facility];  Service: Endoscopy;  Laterality: N/A;  ? ESOPHAGOGASTRODUODENOSCOPY N/A 07/06/2018  ? Procedure: ESOPHAGOGASTRODUODENOSCOPY (EGD);  Surgeon: [name], MD;  [facility];  Service: Endoscopy;  Laterality: N/A;  ? GIVENS CAPSULE STUDY N/A 12/26/2019  ? Procedure: GIVENS CAPSULE STUDY;  Surgeon: [name], MD;  [facility];  Service: Endoscopy;  Laterality: N/A;  730  ? PARTIAL COLECTOMY  2006  ? adenocarcinoma  ? POLYPECTOMY  04/26/2017  ? Procedure: POLYPECTOMY;  Surgeon: [name], MD;  [facility];  Service: Endoscopy;;  colon  ? POLYPECTOMY  07/06/2018  ? Procedure: POLYPECTOMY;  Surgeon: [name], MD;  [facility];  Service: Endoscopy;;  colon ?  ? TONSILLECTOMY    ? ?Patient Active Problem List  ? Diagnosis Date Noted  ? Encounter for monitoring opioid maintenance therapy 03/26/2020  ? Loss of weight 01/23/2019  ? Adnexal mass  01/13/2019  ? Chronic pain syndrome 11/08/2018  ? Abnormal CT of the abdomen 06/23/2018  ? Gastric wall thickening 06/23/2018  ? History of colonic polyps 04/27/2018  ? History of colon cancer 04/27/2018  ? Family hx of colon cancer 04/27/2018  ? Early satiety 09/20/2017  ? Malignant neoplasm of colon ([place]) 01/21/2017  ? Sciatica of right side 03/06/2015  ? Knee pain, left 03/13/2014  ? Pain in joint, ankle and foot 03/13/2014  ? Muscle weakness (generalized) 01/10/2014  ? Difficulty in walking(719.7) 01/10/2014  ? 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 Sciatica of left side 04/26/2013  ? Chest pain at rest 03/21/2013  ? Prediabetes 12/30/2012  ? Paroxysmal atrial fibrillation  (HCC)   ? ADENOCARCINOMA, COLON 06/24/2009  ? GASTROESOPHAGEAL REFLUX DISEASE 06/24/2009  ? Hyperlipidemia 06/12/2009  ? ? ?ONSET DATE: 05/26/21 ? ?REFERRING DIAG: s/p left humerus fx of greater tuberosity  ? ?THERAPY DIAG:  ?Other symptoms and signs involving the musculoskeletal system ? ?Stiffness of left shoulder, not elsewhere classified ? ?Acute pain of left shoulder ? ? ?PERTINENT HISTORY: Pt is a 81 y/o female s/p fall on 05/26/21 and sustaining a left humerus fx of greater tuberosity ? ?PRECAUTIONS: None; progress as tolerated ? ?SUBJECTIVE: S: I changed out my season clothes and I've been doing a lot of ironing. ? ? ?PAIN:  ?Are you having pain? Yes: NPRS scale: 2 /10 ?Pain location: left shoulder ?Pain description: nagging, Constant, sore ?Aggravating factors: unknown ?Relieving factors: prescription pain medication ? ? ? ?OBJECTIVE:  ? ? FOTO: 53/100 ? ?TODAY'S TREATMENT:  ?  08/26/21 ?  -Shoulder strengthening: protraction, flexion, horizontal abduction, er/IR, abduction, 12X, 1# weight ?  -Proximal shoulder strengthening: 1#, seated, 10X each ?  -Therapy Ball strengthening: green therapy ball-chest press, overhead press, flexion, circles each direction, 10X  ?  -Ball on wall: 1' flexion, 1' abduction ?  -Ball pass: behind back for IR and behind head for er, 10X ?-Functional reaching: Pt wearing 1# wrist weight, placing 10 cones on middle shelf of overhead cabinet in flexion, removing in abduction ?  -UBE: level 2, 3' reverse, pace: 7.5 ? ? ?  08/21/21 ?- Manual therapy: completed prior to exercises. Soft tissue mobilization and manual trigger point release completed to left shoulder pectoralis major and bicep tendon.  ?- manual stretching: left shoulder supine, IR/er 5X ?- Stretching: shoulder, IR, horizontal with towel, 2x20" ?- Self pectoralis major release, anterior deltoid/bicep tendon release with tennis ball ? ? ?  08/19/21 ?-Shoulder strengthening: protraction, flexion, horizontal abduction, er/IR,  abduction, 12X, 1# weight ?  -Therapy Ball strengthening: basketball-chest press, overhead press, flexion, circles each direction, 10X  ?  -Proximal shoulder strengthening: 10X each, no rest breaks, sitting ?-Theraband strengthening: abduction, adduction, er/IR, flexion, horizontal abduction, extension. X10 reps standing with yellow band.  ?  -Placing and removing cones in cabinet overhead with 1# wrist weight.  ?  -UBE: level 1, 2' forward and reverse 5.5 pace. ? ?  ? ?   ? ? ?    ? Home Exercise Program: ?4/11: theraband strengthening of shoulder, HEP given with tactile and verbal cues; pt returned demonstration 4/13: Pectoralis major release with tennis ball, IR stretch ?   ?.PATIENT EDUCATION: ?Education details: self pectoralis major release with tennis ball, IR shoulder stretch. Stay hydrated. Use heat to help relax tight muscles. Use ice right after therapy session for 10' to help with pain management and any post session soreness.  ?Person educated: Patient ?Education  method: Explanation, Demonstration, Tactile cues, Verbal cues, and Handouts ?Education comprehension: verbalized understanding, returned demonstration, and verbal cues required  ? ? ? ? ? OT Short Term Goals  ? ?  ? OT SHORT TERM GOAL #1  ? Title Patient will be educated and independent with HEP to increase mobility required for LUE use.   ? Time 4   ? Period Weeks   ? Status   ? Target Date 08/01/21   ?  ? OT SHORT TERM GOAL #2  ? Title Pt will increase LUE P/ROM to [facility] to increase ability to perform dressing tasks with minimal compensatory strategies.   ? Time 4   ? Period Weeks   ? Status   ?  ? OT SHORT TERM GOAL #3  ? Title Patient will increase RUE strength to 3/5 to be able to complete reaching tasks from waist to chest height during ADLs.   ? Time 4   ? Period Weeks   ? Status   ? ?  ?  ? ?  ? ? ? OT Long Term Goals   ? ?  ? OT LONG TERM GOAL #1  ? Title Pt will decrease LUE pain to 3/10 or less to improve ability to sleep in position  of comfort without waking due to pain.   ? Time 8   ? Period Weeks   ? Status On-going   ? Target Date 08/31/21   ?  ? OT LONG TERM GOAL #2  ? Title Pt will decrease LUE fascial restrictions to min amounts

## 2021-08-28 ENCOUNTER — Encounter (HOSPITAL_COMMUNITY): Payer: Self-pay

## 2021-08-28 ENCOUNTER — Ambulatory Visit (HOSPITAL_COMMUNITY): Payer: Medicare Other

## 2021-08-28 DIAGNOSIS — M25612 Stiffness of left shoulder, not elsewhere classified: Secondary | ICD-10-CM | POA: Diagnosis not present

## 2021-08-28 DIAGNOSIS — M25512 Pain in left shoulder: Secondary | ICD-10-CM

## 2021-08-28 DIAGNOSIS — R29898 Other symptoms and signs involving the musculoskeletal system: Secondary | ICD-10-CM | POA: Diagnosis not present

## 2021-08-28 NOTE — Patient Instructions (Signed)
External Rotator Cuff Stretch, Supine ? ? ? ?Lie supine, fingers clasped behind head, elbows close together. Pull elbows backward while pinching shoulder blades. Hold _10-15__ seconds. ?Repeat _2-3__ times per session.  ?Complete once at night when going to bed and once in the AM when waking up, prior to getting out of bed.  ? ?Complete the following stretch 1-2 times a day.  ? ? ?Wall Flexion ? ?Slide your arm up the wall or door frame until a stretch is felt in your shoulder . Hold for 10-15 seconds. Complete 2 times  ? ? ? ? ? ?Copyright ? V[employer]. All rights reserved.   ?

## 2021-08-28 NOTE — Therapy (Signed)
?OUTPATIENT OCCUPATIONAL THERAPY TREATMENT NOTE ?Reassessment and discharge ? ? ?Patient Name: [name] ?MRN: [id number] ?DOB:07-Mar-1941, 81 y.o., female ?Today's Date: 08/28/2021 ? ?PCP: Kathyrn Drown, MD ?REFERRING PROVIDER: Dr. Sanjuana Kava ? ?END OF SESSION: ? OT End of Session - 08/28/21 1248   ? ? Visit Number 15   ? Number of Visits 16   ? Authorization Type [employer] Medicare, $20 copay   ? Authorization Time Period no visit limit   ? Progress Note Due on Visit 17   ? OT Start Time 1115   ? OT Stop Time 1153   ? OT Time Calculation (min) 38 min   ? Activity Tolerance Patient tolerated treatment well   ? Behavior During Therapy [facility] for tasks assessed/performed   ? ?  ?  ? ?  ? ? ? ? ?Past Medical History:  ?Diagnosis Date  ? Adenocarcinoma, colon ([place]) 03/2005  ? Stage I; resected in 03/2005  ? Arthritis   ? Colon cancer ([place])   ? Removed surgicaly   ? Gastroesophageal reflux disease   ? Gastroparesis   ? Hearing loss   ? mild  ? Hyperlipidemia   ? Irregular heartbeat   ? Ovarian cyst 2008  ? Paroxysmal atrial fibrillation (HCC)   ? Palpitations; maintained on flecainide; -normal coronary angiography and 1999; negative stress nuclear study in 11/2005  ? PONV (postoperative nausea and vomiting)   ? Tobacco abuse, in remission   ? 10-pack-years; quit in 2001  ? ?Past Surgical History:  ?Procedure Laterality Date  ? ABDOMINAL HYSTERECTOMY  1983  ? BACK SURGERY    ? BIOPSY  10/18/2017  ? Procedure: BIOPSY;  Surgeon: [name], MD;  [facility];  Service: Endoscopy;;  gastric  ? BIOPSY  07/06/2018  ? Procedure: BIOPSY;  Surgeon: [name], MD;  [facility];  Service: Endoscopy;;  gastric  ? CHOLECYSTECTOMY    ? COLON SURGERY  2006  ? Colon cancer  ? COLONOSCOPY  03/25/2012  ? [name], MD  ? COLONOSCOPY N/A 04/26/2017  ? Procedure: COLONOSCOPY;  Surgeon: [name], MD;  [facility];  Service: Endoscopy;  Laterality: N/A;  2:00  ? COLONOSCOPY N/A  07/06/2018  ? Procedure: COLONOSCOPY;  Surgeon: [name], MD;  [facility];  Service: Endoscopy;  Laterality: N/A;  10:30  ? COSMETIC SURGERY  09/2007  ? ESOPHAGOGASTRODUODENOSCOPY N/A 10/18/2017  ? Procedure: ESOPHAGOGASTRODUODENOSCOPY (EGD);  Surgeon: [name], MD;  [facility];  Service: Endoscopy;  Laterality: N/A;  ? ESOPHAGOGASTRODUODENOSCOPY N/A 07/06/2018  ? Procedure: ESOPHAGOGASTRODUODENOSCOPY (EGD);  Surgeon: [name], MD;  [facility];  Service: Endoscopy;  Laterality: N/A;  ? GIVENS CAPSULE STUDY N/A 12/26/2019  ? Procedure: GIVENS CAPSULE STUDY;  Surgeon: [name], MD;  [facility];  Service: Endoscopy;  Laterality: N/A;  730  ? PARTIAL COLECTOMY  2006  ? adenocarcinoma  ? POLYPECTOMY  04/26/2017  ? Procedure: POLYPECTOMY;  Surgeon: [name], MD;  [facility];  Service: Endoscopy;;  colon  ? POLYPECTOMY  07/06/2018  ? Procedure: POLYPECTOMY;  Surgeon: [name], MD;  [facility];  Service: Endoscopy;;  colon ?  ? TONSILLECTOMY    ? ?Patient Active Problem List  ? Diagnosis Date Noted  ? Encounter for monitoring opioid maintenance therapy 03/26/2020  ? Loss of weight 01/23/2019  ? Adnexal mass 01/13/2019  ? Chronic  pain syndrome 11/08/2018  ? Abnormal CT of the abdomen 06/23/2018  ? Gastric wall thickening 06/23/2018  ? History of colonic polyps 04/27/2018  ? History of colon cancer 04/27/2018  ? Family hx of colon cancer 04/27/2018  ? Early satiety 09/20/2017  ? Malignant neoplasm of colon ([place]) 01/21/2017  ? Sciatica of right side 03/06/2015  ? Knee pain, left 03/13/2014  ? Pain in joint, ankle and foot 03/13/2014  ? Muscle weakness (generalized) 01/10/2014  ? Difficulty in walking(719.7) 01/10/2014  ? 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 Sciatica of left side 04/26/2013  ? Chest pain at rest 03/21/2013  ? Prediabetes 12/30/2012  ? Paroxysmal atrial fibrillation (HCC)    ? ADENOCARCINOMA, COLON 06/24/2009  ? GASTROESOPHAGEAL REFLUX DISEASE 06/24/2009  ? Hyperlipidemia 06/12/2009  ? ? ?ONSET DATE: 05/26/21 ? ?REFERRING DIAG: s/p left humerus fx of greater tuberosity  ? ?THERAPY DIAG:  ?Stiffness of left shoulder, not elsewhere classified ? ?Other symptoms and signs involving the musculoskeletal system ? ?Acute pain of left shoulder ? ? ?PERTINENT HISTORY: Pt is a 81 y/o female s/p fall on 05/26/21 and sustaining a left humerus fx of greater tuberosity ? ?PRECAUTIONS: None; progress as tolerated ? ?SUBJECTIVE: S: It's more sore today than it usually is. I don't know why.  ? ? ?PAIN:  ?Are you having pain? Yes: NPRS scale: 4 /10 ?Pain location: left shoulder ?Pain description: nagging, Constant, sore ?Aggravating factors: maybe cleaning house yesterday ?Relieving factors: prescription pain medication ? ? ? ?OBJECTIVE:  ?Left shoulder A/ROM - assessed standing IR/er adducted ?  Evaluation 07/29/21 08/28/21  ? flexion 82 130 135  ?abduction 66 116 120  ?Internal rotation 90 90 90  ?External rotation 50 70 74  ?  ?Left shoulder P/ROM - assessed supine IR/er adducted ?  Evaluation 07/29/21 08/28/21  ? flexion 90 120 143  ?abduction 95 165 180  ?Internal rotation 90 90 90  ?External rotation 42 75 68  ?  ?Left shoulder MMT - assessed standing IR/er adducted. ? ?  07/29/21 08/28/21  ? flexion 4+/5 5/5  ?abduction 5/5 5/5  ?Internal rotation 5/5 5/5  ?External rotation 4-/5 5/5  ? ? ? FOTO: 65/100 ? ?TODAY'S TREATMENT:  ?  08/28/21 ?  - P/ROM: shoulder supine, 5X all ranges.  ?  -Stretches: shoulder, flexion, IR with towel horizontal , star gazer 2x10" ? ?   ? ?   ? ?    ? Home Exercise Program: ?4/11: theraband strengthening of shoulder, HEP given with tactile and verbal cues; pt returned demonstration 4/13: Pectoralis major release with tennis ball, IR stretch ?   ?.PATIENT EDUCATION: ?Education details: reviewed HEP. Recommended that she complete shoulder flexion, ir, and star gazer stretch. Use  teniis ball to complete self myofascial release to upper arm prior to stretching when able.  ?Person educated: patient ?Education method:verbalized, demonstrated, handout ?Education comprehension: returned demonstration and verbalized understanding.  ? ? ? ? ? OT Short Term Goals  ? ?  ? OT SHORT TERM GOAL #1  ? Title Patient will be educated and independent with HEP to increase mobility required for LUE use.   ? Time 4   ? Period Weeks   ? Status   ? Target Date 08/01/21   ?  ? OT SHORT TERM GOAL #2  ? Title Pt will increase LUE P/ROM to [facility] to increase ability to perform dressing tasks with minimal compensatory strategies.   ? Time 4   ? Period  Weeks   ? Status   ?  ? OT SHORT TERM GOAL #3  ? Title Patient will increase RUE strength to 3/5 to be able to complete reaching tasks from waist to chest height during ADLs.   ? Time 4   ? Period Weeks   ? Status   ? ?  ?  ? ?  ? ? ? OT Long Term Goals   ? ?  ? OT LONG TERM GOAL #1  ? Title Pt will decrease LUE pain to 3/10 or less to improve ability to sleep in position of comfort without waking due to pain.   ? Time 8   ? Period Weeks   ? Status MET  ? Target Date 08/31/21   ?  ? OT LONG TERM GOAL #2  ? Title Pt will decrease LUE fascial restrictions to min amounts or less to improve ability to perform functional reaching tasks.   ? Time 8   ? Period Weeks   ? Status MET  ?  ? OT LONG TERM GOAL #3  ? Title Pt will increase LUE A/ROM to [facility] to improve ability to fix her hair using LUE as assist.   ? Time 8   ? Period Weeks   ? Status Partially Met  ?  ? OT LONG TERM GOAL #4  ? Title Pt will increase LUE strength to 4+/5 or greater to improve ability to perform cooking tasks.   ? Time 8   ? Period Weeks   ? Status MET  ? ?  ?  ? ?  ? ? ? Plan  ? ? Clinical Impression Statement A:  Reassessment completed this date. Patient has met all short term goals and 3/4 LTGs with 2 partially met. Patient is using her LUE as much as possible to complete all daily tasks and while caring  for her Husband. She reports difficulty fixing the back of her hair due to shoulder tightness and decreased endurance. Discussed HEP and recommendations were made for shoulder stretches to continue working

## 2021-09-08 ENCOUNTER — Encounter (HOSPITAL_COMMUNITY): Payer: Self-pay

## 2021-09-08 ENCOUNTER — Encounter (HOSPITAL_COMMUNITY)
Admission: RE | Admit: 2021-09-08 | Discharge: 2021-09-08 | Disposition: A | Discharge status: Home / Self Care | Payer: Medicare Other | Source: Ambulatory Visit | Attending: Internal Medicine | Admitting: Internal Medicine

## 2021-09-08 ENCOUNTER — Ambulatory Visit (HOSPITAL_COMMUNITY)
Admission: RE | Admit: 2021-09-08 | Discharge: 2021-09-08 | Disposition: A | Discharge status: Home / Self Care | Payer: Medicare Other | Source: Ambulatory Visit | Attending: Family Medicine | Admitting: Family Medicine

## 2021-09-08 DIAGNOSIS — Z1231 Encounter for screening mammogram for malignant neoplasm of breast: Secondary | ICD-10-CM | POA: Insufficient documentation

## 2021-09-08 HISTORY — DX: Cardiac arrhythmia, unspecified: I49.9

## 2021-09-08 NOTE — Patient Instructions (Addendum)
? ? ? ? ? ? ? ? [name] ? 09/08/2021  ?  ? '@PREFPERIOPPHARMACY'$ @ ? ? Your procedure is scheduled on  09/10/2021. ? ? Report to [facility] at  0800 A.M. ? ? Call this number if you have problems the morning of surgery: ? [phone] ? ? Remember: ? Follow the diet and prep instructions given to you by the office. ? ?    Your last dose of iron should have been 08/31/2021 and your last dose of eliquis should have been 09/07/2021. ? ?  ? Take these medicines the morning of surgery with A SIP OF WATER   ? ?                                   flecanide, prilosec. ?  ? Do not wear jewelry, make-up or nail polish. ? Do not wear lotions, powders, or perfumes, or deodorant. ? Do not shave 48 hours prior to surgery.  Men may shave face and neck. ? Do not bring valuables to the hospital. ? [facility] is not responsible for any belongings or valuables. ? ?Contacts, dentures or bridgework may not be worn into surgery.  Leave your suitcase in the car.  After surgery it may be brought to your room. ? ?For patients admitted to the hospital, discharge time will be determined by your treatment team. ? ?Patients discharged the day of surgery will not be allowed to drive home and must have someone with them for 24 hours..  ? ? ?Special instructions:   DO NOT smoke tobacco or vape for 24 hours before your procedure. ? ?Please read over the following fact sheets that you were given. ?Anesthesia Post-op Instructions and Care and Recovery After Surgery ?  ? ? ? Colonoscopy, Adult, Care After ?The following information offers guidance on how to care for yourself after your procedure. Your health care provider may also give you more specific instructions. If you have problems or questions, contact your health care provider. ?What can I expect after the procedure? ?After the procedure, it is common to have: ?A small amount of blood in your stool for 24 hours after the procedure. ?Some gas. ?Mild cramping or bloating of your abdomen. ?Follow  these instructions at home: ?Eating and drinking ? ?Drink enough fluid to keep your urine pale yellow. ?Follow instructions from your health care provider about eating or drinking restrictions. ?Resume your normal diet as told by your health care provider. Avoid heavy or fried foods that are hard to digest. ?Activity ?Rest as told by your health care provider. ?Avoid sitting for a long time without moving. Get up to take short walks every 1-2 hours. This is important to improve blood flow and breathing. Ask for help if you feel weak or unsteady. ?Return to your normal activities as told by your health care provider. Ask your health care provider what activities are safe for you. ?Managing cramping and bloating ? ?Try walking around when you have cramps or feel bloated. ?If directed, apply heat to your abdomen as told by your health care provider. Use the heat source that your health care provider recommends, such as a moist heat pack or a heating pad. ?Place a towel between your skin and the heat source. ?Leave the heat on for 20-30 minutes. ?Remove the heat if your skin turns bright red. This is especially important if you are unable to feel pain, heat,  or cold. You have a greater risk of getting burned. ?General instructions ?If you were given a sedative during the procedure, it can affect you for several hours. Do not drive or operate machinery until your health care provider says that it is safe. ?For the first 24 hours after the procedure: ?Do not sign important documents. ?Do not drink alcohol. ?Do your regular daily activities at a slower pace than normal. ?Eat soft foods that are easy to digest. ?Take over-the-counter and prescription medicines only as told by your health care provider. ?Keep all follow-up visits. This is important. ?Contact a health care provider if: ?You have blood in your stool 2-3 days after the procedure. ?Get help right away if: ?You have more than a small spotting of blood in your  stool. ?You have large blood clots in your stool. ?You have swelling of your abdomen. ?You have nausea or vomiting. ?You have a fever. ?You have increasing pain in your abdomen that is not relieved with medicine. ?These symptoms may be an emergency. Get help right away. Call 911. ?Do not wait to see if the symptoms will go away. ?Do not drive yourself to the hospital. ?Summary ?After the procedure, it is common to have a small amount of blood in your stool. You may also have mild cramping and bloating of your abdomen. ?If you were given a sedative during the procedure, it can affect you for several hours. Do not drive or operate machinery until your health care provider says that it is safe. ?Get help right away if you have a lot of blood in your stool, nausea or vomiting, a fever, or increased pain in your abdomen. ?This information is not intended to replace advice given to you by your health care provider. Make sure you discuss any questions you have with your health care provider. ?Document Revised: 12/18/2020 Document Reviewed: 12/18/2020 ?Elsevier Patient Education ? [place]. ? ?Monitored Anesthesia Care, Care After ?This sheet gives you information about how to care for yourself after your procedure. Your health care provider may also give you more specific instructions. If you have problems or questions, contact your health care provider. ?What can I expect after the procedure? ?After the procedure, it is common to have: ?Tiredness. ?Forgetfulness about what happened after the procedure. ?Impaired judgment for important decisions. ?Nausea or vomiting. ?Some difficulty with balance. ?Follow these instructions at home: ?For the time period you were told by your health care provider: ? ?  ? ?Rest as needed. ?Do not participate in activities where you could fall or become injured. ?Do not drive or use machinery. ?Do not drink alcohol. ?Do not take sleeping pills or medicines that cause drowsiness. ?Do  not make important decisions or sign legal documents. ?Do not take care of children on your own. ?Eating and drinking ?Follow the diet that is recommended by your health care provider. ?Drink enough fluid to keep your urine pale yellow. ?If you vomit: ?Drink water, juice, or soup when you can drink without vomiting. ?Make sure you have little or no nausea before eating solid foods. ?General instructions ?Have a responsible adult stay with you for the time you are told. It is important to have someone help care for you until you are awake and alert. ?Take over-the-counter and prescription medicines only as told by your health care provider. ?If you have sleep apnea, surgery and certain medicines can increase your risk for breathing problems. Follow instructions from your health care provider about wearing your sleep  device: ?Anytime you are sleeping, including during daytime naps. ?While taking prescription pain medicines, sleeping medicines, or medicines that make you drowsy. ?Avoid smoking. ?Keep all follow-up visits as told by your health care provider. This is important. ?Contact a health care provider if: ?You keep feeling nauseous or you keep vomiting. ?You feel light-headed. ?You are still sleepy or having trouble with balance after 24 hours. ?You develop a rash. ?You have a fever. ?You have redness or swelling around the IV site. ?Get help right away if: ?You have trouble breathing. ?You have new-onset confusion at home. ?Summary ?For several hours after your procedure, you may feel tired. You may also be forgetful and have poor judgment. ?Have a responsible adult stay with you for the time you are told. It is important to have someone help care for you until you are awake and alert. ?Rest as told. Do not drive or operate machinery. Do not drink alcohol or take sleeping pills. ?Get help right away if you have trouble breathing, or if you suddenly become confused. ?This information is not intended to replace  advice given to you by your health care provider. Make sure you discuss any questions you have with your health care provider. ?Document Revised: 04/01/2021 Document Reviewed: 03/30/2019 ?Elsevier Patient

## 2021-09-09 ENCOUNTER — Ambulatory Visit (INDEPENDENT_AMBULATORY_CARE_PROVIDER_SITE_OTHER): Payer: Medicare Other

## 2021-09-09 VITALS — Ht 62.0 in | Wt 106.0 lb

## 2021-09-09 DIAGNOSIS — Z0000 Encounter for general adult medical examination without abnormal findings: Secondary | ICD-10-CM

## 2021-09-09 NOTE — Patient Instructions (Addendum)
[name] , ?Thank you for taking time to come for your Medicare Wellness Visit. I appreciate your ongoing commitment to your health goals. Please review the following plan we discussed and let me know if I can assist you in the future.  ? ?Screening recommendations/referrals: ?Colonoscopy: Scheduled for 09/10/2021. ?Mammogram: Done 09/08/2021. Repeat annually ? ?Bone Density: Done 01/02/2021 Repeat every 2 years ? ?Recommended yearly ophthalmology/optometry visit for glaucoma screening and checkup ?Recommended yearly dental visit for hygiene and checkup ? ?Vaccinations: ?Influenza vaccine: Done 03/12/2021 Repeat annually ? ?Pneumococcal vaccine: Done 03/29/2014 and 03/30/2008 ?Tdap vaccine: Done 09/11/2014 Repeat in 10 years ? ?Shingles vaccine: Done 03/11/2017 and 07/13/2017   ?Covid-19:Done 07/06/2019, 08/02/2019 and 04/10/2020. ? ?Advanced directives: Please bring a copy of your health care power of attorney and living will to the office to be added to your chart at your convenience. ? ? ?Conditions/risks identified: KEEP UP THE GOOD WORK!! ? ?Next appointment: Follow up in one year for your annual wellness visit 09/15/2022 @ 2:30 pm.  ? ? ?Preventive Care 20 Years and Older, Female ?Preventive care refers to lifestyle choices and visits with your health care provider that can promote health and wellness. ?What does preventive care include? ?A yearly physical exam. This is also called an annual well check. ?Dental exams once or twice a year. ?Routine eye exams. Ask your health care provider how often you should have your eyes checked. ?Personal lifestyle choices, including: ?Daily care of your teeth and gums. ?Regular physical activity. ?Eating a healthy diet. ?Avoiding tobacco and drug use. ?Limiting alcohol use. ?Practicing safe sex. ?Taking low-dose aspirin every day. ?Taking vitamin and mineral supplements as recommended by your health care provider. ?What happens during an annual well check? ?The services and screenings  done by your health care provider during your annual well check will depend on your age, overall health, lifestyle risk factors, and family history of disease. ?Counseling  ?Your health care provider may ask you questions about your: ?Alcohol use. ?Tobacco use. ?Drug use. ?Emotional well-being. ?Home and relationship well-being. ?Sexual activity. ?Eating habits. ?History of falls. ?Memory and ability to understand (cognition). ?Work and work Statistician. ?Reproductive health. ?Screening  ?You may have the following tests or measurements: ?Height, weight, and BMI. ?Blood pressure. ?Lipid and cholesterol levels. These may be checked every 5 years, or more frequently if you are over 70 years old. ?Skin check. ?Lung cancer screening. You may have this screening every year starting at age 94 if you have a 30-pack-year history of smoking and currently smoke or have quit within the past 15 years. ?Fecal occult blood test (FOBT) of the stool. You may have this test every year starting at age 55. ?Flexible sigmoidoscopy or colonoscopy. You may have a sigmoidoscopy every 5 years or a colonoscopy every 10 years starting at age 10. ?Hepatitis C blood test. ?Hepatitis B blood test. ?Sexually transmitted disease (STD) testing. ?Diabetes screening. This is done by checking your blood sugar (glucose) after you have not eaten for a while (fasting). You may have this done every 1-3 years. ?Bone density scan. This is done to screen for osteoporosis. You may have this done starting at age 60. ?Mammogram. This may be done every 1-2 years. Talk to your health care provider about how often you should have regular mammograms. ?Talk with your health care provider about your test results, treatment options, and if necessary, the need for more tests. ?Vaccines  ?Your health care provider may recommend certain vaccines, such as: ?Influenza vaccine.  This is recommended every year. ?Tetanus, diphtheria, and acellular pertussis (Tdap, Td)  vaccine. You may need a Td booster every 10 years. ?Zoster vaccine. You may need this after age 72. ?Pneumococcal 13-valent conjugate (PCV13) vaccine. One dose is recommended after age 56. ?Pneumococcal polysaccharide (PPSV23) vaccine. One dose is recommended after age 38. ?Talk to your health care provider about which screenings and vaccines you need and how often you need them. ?This information is not intended to replace advice given to you by your health care provider. Make sure you discuss any questions you have with your health care provider. ?Document Released: 05/24/2015 Document Revised: 01/15/2016 Document Reviewed: 02/26/2015 ?Elsevier Interactive Patient Education ? 2017 [place]. ? ?Fall Prevention in the Home ?Falls can cause injuries. They can happen to people of all ages. There are many things you can do to make your home safe and to help prevent falls. ?What can I do on the outside of my home? ?Regularly fix the edges of walkways and driveways and fix any cracks. ?Remove anything that might make you trip as you walk through a door, such as a raised step or threshold. ?Trim any bushes or trees on the path to your home. ?Use bright outdoor lighting. ?Clear any walking paths of anything that might make someone trip, such as rocks or tools. ?Regularly check to see if handrails are loose or broken. Make sure that both sides of any steps have handrails. ?Any raised decks and porches should have guardrails on the edges. ?Have any leaves, snow, or ice cleared regularly. ?Use sand or salt on walking paths during winter. ?Clean up any spills in your garage right away. This includes oil or grease spills. ?What can I do in the bathroom? ?Use night lights. ?Install grab bars by the toilet and in the tub and shower. Do not use towel bars as grab bars. ?Use non-skid mats or decals in the tub or shower. ?If you need to sit down in the shower, use a plastic, non-slip stool. ?Keep the floor dry. Clean up any  water that spills on the floor as soon as it happens. ?Remove soap buildup in the tub or shower regularly. ?Attach bath mats securely with double-sided non-slip rug tape. ?Do not have throw rugs and other things on the floor that can make you trip. ?What can I do in the bedroom? ?Use night lights. ?Make sure that you have a light by your bed that is easy to reach. ?Do not use any sheets or blankets that are too big for your bed. They should not hang down onto the floor. ?Have a firm chair that has side arms. You can use this for support while you get dressed. ?Do not have throw rugs and other things on the floor that can make you trip. ?What can I do in the kitchen? ?Clean up any spills right away. ?Avoid walking on wet floors. ?Keep items that you use a lot in easy-to-reach places. ?If you need to reach something above you, use a strong step stool that has a grab bar. ?Keep electrical cords out of the way. ?Do not use floor polish or wax that makes floors slippery. If you must use wax, use non-skid floor wax. ?Do not have throw rugs and other things on the floor that can make you trip. ?What can I do with my stairs? ?Do not leave any items on the stairs. ?Make sure that there are handrails on both sides of the stairs and use them. Fix handrails  that are broken or loose. Make sure that handrails are as long as the stairways. ?Check any carpeting to make sure that it is firmly attached to the stairs. Fix any carpet that is loose or worn. ?Avoid having throw rugs at the top or bottom of the stairs. If you do have throw rugs, attach them to the floor with carpet tape. ?Make sure that you have a light switch at the top of the stairs and the bottom of the stairs. If you do not have them, ask someone to add them for you. ?What else can I do to help prevent falls? ?Wear shoes that: ?Do not have high heels. ?Have rubber bottoms. ?Are comfortable and fit you well. ?Are closed at the toe. Do not wear sandals. ?If you use a  stepladder: ?Make sure that it is fully opened. Do not climb a closed stepladder. ?Make sure that both sides of the stepladder are locked into place. ?Ask someone to hold it for you, if possible. ?Clearly mark and m

## 2021-09-09 NOTE — Progress Notes (Signed)
? ?Subjective:  ? [name] is a 81 y.o. female who presents for Medicare Annual (Subsequent) preventive examination. ?Virtual Visit via Telephone Note ? ?I connected with  [name] on 09/09/21 at  1:00 PM EDT by telephone and verified that I am speaking with the correct person using two identifiers. ? ?Location: ?Patient: HOME ?Provider: [facility] ?Persons participating in the virtual visit: patient/Nurse Health Advisor ?  ?I discussed the limitations, risks, security and privacy concerns of performing an evaluation and management service by telephone and the availability of in person appointments. The patient expressed understanding and agreed to proceed. ? ?Interactive audio and video telecommunications were attempted between this nurse and patient, however failed, due to patient having technical difficulties OR patient did not have access to video capability.  We continued and completed visit with audio only. ? ?Some vital signs may be absent or patient reported.  ? ?Lisa Driver, LPN ? ?Review of Systems    ? ?Cardiac Risk Factors include: advanced age (>52mn, >>49women);dyslipidemia;sedentary lifestyle;Other (see comment), Risk factor comments: AFIB, LUMBAR SPONKYLOSIS, CHRONIC PAIN. ? ?   ?Objective:  ?  ?Today's Vitals  ? 09/09/21 1302 09/09/21 1304  ?Weight: 106 lb (48.1 kg)   ?Height: '5\' 2"'$  (1.575 m)   ?PainSc:  3   ? ?Body mass index is 19.39 kg/m?. ? ? ?  09/09/2021  ?  1:12 PM 09/08/2021  ? 11:31 AM 07/02/2021  ?  2:45 PM 04/18/2020  ? 11:56 AM 07/06/2018  ?  9:38 AM 06/30/2018  ? 11:22 AM 06/09/2018  ?  1:58 PM  ?Advanced Directives  ?Does Patient Have a Medical Advance Directive? No No No No No No No  ?Would patient like information on creating a medical advance directive? No - Patient declined No - Patient declined No - Patient declined No - Patient declined Yes (MAU/Ambulatory/Procedural Areas - Information given)    ? ? ?Current Medications (verified) ?Outpatient Encounter Medications as of  09/09/2021  ?Medication Sig  ? alendronate (FOSAMAX) 70 MG tablet Take 1 tablet (70 mg total) by mouth every 7 (seven) days. Take with a full glass of water on an empty stomach. (Patient taking differently: Take 70 mg by mouth every Thursday. Take with a full glass of water on an empty stomach.)  ? apixaban (ELIQUIS) 5 MG TABS tablet Take 1 tablet (5 mg total) by mouth 2 (two) times daily.  ? Calcium Carbonate (CALCIUM 600 PO) Take 600 mg by mouth daily.  ? dexamethasone (DECADRON) 0.5 MG/5ML solution Take by mouth.  ? diclofenac Sodium (VOLTAREN) 1 % GEL Voltaren 1 % topical gel ? APPLY 4 GRAM TO THE AFFECTED AREA(S) BY TOPICAL ROUTE 4 TIMES PER DAY  ? ferrous sulfate 325 (65 FE) MG tablet Take 325 mg by mouth daily with breakfast.  ? flecainide (TAMBOCOR) 100 MG tablet Take 1 tablet (100 mg total) by mouth every 12 (twelve) hours.  ? HYDROcodone-acetaminophen (NORCO) 10-325 MG tablet Take 1 tablet by mouth 3 (three) times daily as needed.  ? HYDROcodone-acetaminophen (NORCO) 10-325 MG tablet 1 taken 3 times daily as needed for chronic pain  ? HYDROcodone-acetaminophen (NORCO) 10-325 MG tablet TAKE ONE TABLET BY MOUTH THREE TIMES A DAY AS NEEDED FOR PAIN  ? lactulose (CHRONULAC) 10 GM/15ML solution Take 20 g by mouth at bedtime.  ? midodrine (PROAMATINE) 5 MG tablet Take 1 tablet (5 mg total) by mouth 3 (three) times daily with meals.  ? omeprazole (PRILOSEC) 20 MG capsule Take  20 mg by mouth daily before breakfast.   ? rosuvastatin (CRESTOR) 40 MG tablet Take 1 tablet (40 mg total) by mouth daily.  ? vitamin B-12 (CYANOCOBALAMIN) 1000 MCG tablet Take 1,000 mcg by mouth daily.  ? ?No facility-administered encounter medications on file as of 09/09/2021.  ? ? ?Allergies (verified) ?Patient has no known allergies.  ? ?History: ?Past Medical History:  ?Diagnosis Date  ? Adenocarcinoma, colon ([place]) 03/2005  ? Stage I; resected in 03/2005  ? Arthritis   ? Colon cancer ([place])   ? Removed surgicaly   ? Dysrhythmia   ?  Gastroesophageal reflux disease   ? Gastroparesis   ? Hearing loss   ? mild  ? Hyperlipidemia   ? Irregular heartbeat   ? Ovarian cyst 2008  ? Paroxysmal atrial fibrillation ([facility])   ? Palpitations; maintained on flecainide; -normal coronary angiography and 1999; negative stress nuclear study in 11/2005  ? PONV (postoperative nausea and vomiting)   ? Tobacco abuse, in remission   ? 10-pack-years; quit in 2001  ? ?Past Surgical History:  ?Procedure Laterality Date  ? ABDOMINAL HYSTERECTOMY  1983  ? BACK SURGERY    ? BIOPSY  10/18/2017  ? Procedure: BIOPSY;  Surgeon: [name], MD;  [facility];  Service: Endoscopy;;  gastric  ? BIOPSY  07/06/2018  ? Procedure: BIOPSY;  Surgeon: [name], MD;  [facility];  Service: Endoscopy;;  gastric  ? CHOLECYSTECTOMY    ? COLON SURGERY  2006  ? Colon cancer  ? COLONOSCOPY  03/25/2012  ? [name], MD  ? COLONOSCOPY N/A 04/26/2017  ? Procedure: COLONOSCOPY;  Surgeon: [name], MD;  [facility];  Service: Endoscopy;  Laterality: N/A;  2:00  ? COLONOSCOPY N/A 07/06/2018  ? Procedure: COLONOSCOPY;  Surgeon: [name], MD;  [facility];  Service: Endoscopy;  Laterality: N/A;  10:30  ? COSMETIC SURGERY  09/2007  ? ESOPHAGOGASTRODUODENOSCOPY N/A 10/18/2017  ? Procedure: ESOPHAGOGASTRODUODENOSCOPY (EGD);  Surgeon: [name], MD;  [facility];  Service: Endoscopy;  Laterality: N/A;  ? ESOPHAGOGASTRODUODENOSCOPY N/A 07/06/2018  ? Procedure: ESOPHAGOGASTRODUODENOSCOPY (E[facility]);  Surgeon: [name], MD;  [facility];  Service: Endoscopy;  Laterality: N/A;  ? GIVENS CAPSULE STUDY N/A 12/26/2019  ? Procedure: GIVENS CAPSULE STUDY;  Surgeon: [name], MD;  [facility];  Service: Endoscopy;  Laterality: N/A;  730  ? PARTIAL COLECTOMY  2006  ? adenocarcinoma  ? POLYPECTOMY  04/26/2017  ? Procedure: POLYPECTOMY;  Surgeon: [name], MD;  [facility];   Service: Endoscopy;;  colon  ? POLYPECTOMY  07/06/2018  ? Procedure: POLYPECTOMY;  Surgeon: [name], MD;  [facility];  Service: Endoscopy;;  colon ?  ? TONSILLECTOMY    ? ?Family History  ?Problem Relation Age of Onset  ? Heart disease Mother   ? Stroke Mother   ? Heart attack Mother   ? Cancer Father   ?     lung  ? Heart disease Father   ? Hypertension Father   ? Cancer Brother   ?     rectal  ? Healthy Son   ? Heart failure Sister   ? Stroke Sister   ? ?Social History  ? ?Socioeconomic History  ? Marital status: Married  ?  Spouse name: Not on file  ? Number of children: Not on file  ? Years of  education: Not on file  ? Highest education level: Not on file  ?Occupational History  ? Occupation: Radiation protection practitioner  ?Tobacco Use  ? Smoking status: Former  ?  Packs/day: 0.30  ?  Years: 40.00  ?  Pack years: 12.00  ?  Types: Cigarettes  ?  Start date: 02/23/1963  ?  Quit date: 03/25/2002  ?  Years since quitting: 19.4  ? Smokeless tobacco: Never  ? Tobacco comments:  ?  Quit smoking in 2001  ?Vaping Use  ? Vaping Use: Never used  ?Substance and Sexual Activity  ? Alcohol use: No  ?  Alcohol/week: 0.0 standard drinks  ? Drug use: No  ? Sexual activity: Yes  ?  Birth control/protection: Surgical  ?Other Topics Concern  ? Not on file  ?Social History Narrative  ? Lives with husband in a one story home.  Has 3 children.  Retired from Medical sales representative work.  Education: some college.  ? ?Social Determinants of Health  ? ?Financial Resource Strain: Low Risk   ? Difficulty of Paying Living Expenses: Not hard at all  ?Food Insecurity: No Food Insecurity  ? Worried About Charity fundraiser in the Last Year: Never true  ? Ran Out of Food in the Last Year: Never true  ?Transportation Needs: No Transportation Needs  ? Lack of Transportation (Medical): No  ? Lack of Transportation (Non-Medical): No  ?Physical Activity: Sufficiently Active  ? Days of Exercise per Week: 5 days  ? Minutes of Exercise per Session: 30 min   ?Stress: No Stress Concern Present  ? Feeling of Stress : Only a little  ?Social Connections: Socially Integrated  ? Frequency of Communication with Friends and Family: More than three times a week  ? Frequency of Social

## 2021-09-10 ENCOUNTER — Encounter (INDEPENDENT_AMBULATORY_CARE_PROVIDER_SITE_OTHER): Payer: Self-pay | Admitting: *Deleted

## 2021-09-10 ENCOUNTER — Encounter (HOSPITAL_COMMUNITY)
Admission: RE | Disposition: A | Discharge status: Home / Self Care | Payer: Self-pay | Source: Home / Self Care | Attending: Internal Medicine

## 2021-09-10 ENCOUNTER — Encounter (HOSPITAL_COMMUNITY): Payer: Self-pay | Admitting: Internal Medicine

## 2021-09-10 ENCOUNTER — Ambulatory Visit (HOSPITAL_BASED_OUTPATIENT_CLINIC_OR_DEPARTMENT_OTHER): Payer: Medicare Other | Admitting: Anesthesiology

## 2021-09-10 ENCOUNTER — Ambulatory Visit (HOSPITAL_COMMUNITY)
Admission: RE | Admit: 2021-09-10 | Discharge: 2021-09-10 | Disposition: A | Discharge status: Home / Self Care | Payer: Medicare Other | Attending: Internal Medicine | Admitting: Internal Medicine

## 2021-09-10 ENCOUNTER — Ambulatory Visit (HOSPITAL_COMMUNITY): Payer: Medicare Other | Admitting: Anesthesiology

## 2021-09-10 DIAGNOSIS — G709 Myoneural disorder, unspecified: Secondary | ICD-10-CM | POA: Diagnosis not present

## 2021-09-10 DIAGNOSIS — K635 Polyp of colon: Secondary | ICD-10-CM | POA: Diagnosis not present

## 2021-09-10 DIAGNOSIS — Z980 Intestinal bypass and anastomosis status: Secondary | ICD-10-CM | POA: Insufficient documentation

## 2021-09-10 DIAGNOSIS — Z87891 Personal history of nicotine dependence: Secondary | ICD-10-CM | POA: Insufficient documentation

## 2021-09-10 DIAGNOSIS — Z08 Encounter for follow-up examination after completed treatment for malignant neoplasm: Secondary | ICD-10-CM | POA: Diagnosis not present

## 2021-09-10 DIAGNOSIS — Z1211 Encounter for screening for malignant neoplasm of colon: Secondary | ICD-10-CM | POA: Diagnosis not present

## 2021-09-10 DIAGNOSIS — Z85038 Personal history of other malignant neoplasm of large intestine: Secondary | ICD-10-CM | POA: Insufficient documentation

## 2021-09-10 DIAGNOSIS — D123 Benign neoplasm of transverse colon: Secondary | ICD-10-CM | POA: Diagnosis not present

## 2021-09-10 DIAGNOSIS — Z86010 Personal history of colonic polyps: Secondary | ICD-10-CM | POA: Diagnosis not present

## 2021-09-10 DIAGNOSIS — D125 Benign neoplasm of sigmoid colon: Secondary | ICD-10-CM | POA: Insufficient documentation

## 2021-09-10 DIAGNOSIS — Z800 Family history of malignant neoplasm of digestive organs: Secondary | ICD-10-CM | POA: Insufficient documentation

## 2021-09-10 HISTORY — PX: COLONOSCOPY WITH PROPOFOL: SHX5780

## 2021-09-10 HISTORY — PX: POLYPECTOMY: SHX5525

## 2021-09-10 LAB — HM COLONOSCOPY

## 2021-09-10 SURGERY — COLONOSCOPY WITH PROPOFOL
Anesthesia: General

## 2021-09-10 MED ORDER — PROPOFOL 10 MG/ML IV BOLUS
INTRAVENOUS | Status: DC | PRN
  Administered 2021-09-10: 10 mg via INTRAVENOUS
  Administered 2021-09-10: 40 mg via INTRAVENOUS
  Administered 2021-09-10: 50 mg via INTRAVENOUS

## 2021-09-10 MED ORDER — PROPOFOL 500 MG/50ML IV EMUL
INTRAVENOUS | Status: DC | PRN
  Administered 2021-09-10: 200 ug/kg/min via INTRAVENOUS

## 2021-09-10 MED ORDER — LACTATED RINGERS IV SOLN: INTRAVENOUS | Status: DC | PRN

## 2021-09-10 MED ORDER — LIDOCAINE 2% (20 MG/ML) 5 ML SYRINGE
INTRAMUSCULAR | Status: DC | PRN
Start: 2021-09-10 — End: 2021-09-10
  Administered 2021-09-10: 50 mg via INTRAVENOUS

## 2021-09-10 NOTE — Discharge Instructions (Signed)
Resume Eliquis on 09/11/2021 ?Resume other medications and diet as before. ?No driving for 24 hours. ?Physician will call with biopsy results. ?Repeat exam in 3 years(cecal preparation fair) ?

## 2021-09-10 NOTE — Op Note (Signed)
[facility] ?Patient Name: [name] ?Procedure Date: 09/10/2021 9:20 AM ?MRN: [id number] ?Date of Birth: 1940-09-28 ?Attending MD: Hildred Laser , MD ?CSN: [id number] ?Age: 81 ?Admit Type: Outpatient ?Procedure:                Colonoscopy ?Indications:              High risk colon cancer surveillance: Personal  ?                          history of colonic polyps, High risk colon cancer  ?                          surveillance: Personal history of colon cancer ?Providers:                Hildred Laser, MD, Lurline Del, RN, Randa Spike,  ?                          Technician ?Referring MD:             Sallee Lange, MD ?Medicines:                Propofol per Anesthesia ?Complications:            No immediate complications. ?Estimated Blood Loss:     Estimated blood loss was minimal. ?Procedure:                Pre-Anesthesia Assessment: ?                          - Prior to the procedure, a History and Physical  ?                          was performed, and patient medications and  ?                          allergies were reviewed. The patient's tolerance of  ?                          previous anesthesia was also reviewed. The risks  ?                          and benefits of the procedure and the sedation  ?                          options and risks were discussed with the patient.  ?                          All questions were answered, and informed consent  ?                          was obtained. Prior Anticoagulants: The patient  ?                          last took Eliquis (apixaban) 3 days prior to the  ?  procedure. ASA Grade Assessment: III - A patient  ?                          with severe systemic disease. After reviewing the  ?                          risks and benefits, the patient was deemed in  ?                          satisfactory condition to undergo the procedure. ?                          After obtaining informed consent, the colonoscope  ?                           was passed under direct vision. Throughout the  ?                          procedure, the patient's blood pressure, pulse, and  ?                          oxygen saturations were monitored continuously. The  ?                          PCF-HQ190L ([id number]) scope was introduced through  ?                          the anus and advanced to the the cecum, identified  ?                          by appendiceal orifice and ileocecal valve. The  ?                          colonoscopy was performed without difficulty. The  ?                          patient tolerated the procedure well. The quality  ?                          of the bowel preparation was excellent except the  ?                          cecum was fair. The ileocecal valve, appendiceal  ?                          orifice, and rectum were photographed. ?Scope In: 9:44:02 AM ?Scope Out: 10:05:55 AM ?Scope Withdrawal Time: 0 hours 13 minutes 7 seconds  ?Total Procedure Duration: 0 hours 21 minutes 53 seconds  ?Findings: ?     The perianal and digital rectal examinations were normal. ?     Three sessile polyps were found in the sigmoid colon and transverse  ?     colon. The polyps were 4 to 6 mm in size. These polyps were removed with  ?  a cold snare. Resection and retrieval were complete. The pathology  ?     specimen was placed into Bottle Number 1. ?     There was evidence of a prior end-to-side colo-colonic anastomosis in  ?     the mid sigmoid colon. This was patent and was characterized by healthy  ?     appearing mucosa. ?     The retroflexed view of the distal rectum and anal verge was normal and  ?     showed no anal or rectal abnormalities. ?Impression:               - Three 4 to 6 mm polyps in the sigmoid colon and  ?                          in the transverse colon, removed with a cold snare.  ?                          Resected and retrieved. ?                          - Patent end-to-side colo-colonic anastomosis,  ?                           characterized by healthy appearing mucosa. ?Moderate Sedation: ?     Per Anesthesia Care ?Recommendation:           - Patient has a contact number available for  ?                          emergencies. The signs and symptoms of potential  ?                          delayed complications were discussed with the  ?                          patient. Return to normal activities tomorrow.  ?                          Written discharge instructions were provided to the  ?                          patient. ?                          - Resume previous diet today. ?                          - Continue present medications. ?                          - Resume Eliquis (apixaban) at prior dose tomorrow. ?                          - Await pathology results. ?                          - Repeat colonoscopy in 3 years for  surveillance(  ?                          cecum not well prepped) ?Procedure Code(s):        --- Professional --- ?                          [phone], Colonoscopy, flexible; with removal of  ?                          tumor(s), polyp(s), or other lesion(s) by snare  ?                          technique ?Diagnosis Code(s):        --- Professional --- ?                          Z86.010, Personal history of colonic polyps ?                          Z85.038, Personal history of other malignant  ?                          neoplasm of large intestine ?                          K63.5, Polyp of colon ?                          Z98.0, Intestinal bypass and anastomosis status ?CPT copyright 2019 [employer]. All rights reserved. ?The codes documented in this report are preliminary and upon coder review may  ?be revised to meet current compliance requirements. ?Hildred Laser, MD ?Hildred Laser, MD ?09/10/2021 10:14:34 AM ?This report has been signed electronically. ?Number of Addenda: 0 ?

## 2021-09-10 NOTE — Anesthesia Postprocedure Evaluation (Signed)
Anesthesia Post Note ? ?Patient: [name] ? ?Procedure(s) Performed: COLONOSCOPY WITH PROPOFOL ?POLYPECTOMY ? ?Patient location during evaluation: Phase II ?Anesthesia Type: General ?Level of consciousness: awake ?Pain management: pain level controlled ?Vital Signs Assessment: post-procedure vital signs reviewed and stable ?Respiratory status: spontaneous breathing and respiratory function stable ?Cardiovascular status: blood pressure returned to baseline and stable ?Postop Assessment: no headache and no apparent nausea or vomiting ?Anesthetic complications: no ?Comments: Late entry ? ? ?No notable events documented. ? ? ?Last Vitals:  ?Vitals:  ? 09/10/21 0831 09/10/21 1012  ?BP: (!) 156/75 (!) 131/55  ?Pulse: 75 75  ?Resp: 18 16  ?Temp: 36.7 ?C 36.7 ?C  ?SpO2: 100% 100%  ?  ?Last Pain:  ?Vitals:  ? 09/10/21 1012  ?TempSrc: Oral  ?PainSc: 7   ? ? ?  ?  ?  ?  ?  ?  ? ?Louann Sjogren ? ? ? ? ?

## 2021-09-10 NOTE — H&P (Signed)
[name] is an 81 y.o. female.   ?Chief Complaint: Patient is here for colonoscopy ?HPI: Patient is 81 year old Caucasian female with history of sigmoid colon cancer status post surgery in 2006.  She has been having periodic surveillance colonoscopies.  Last exam was in [place] 2020 ?With removal of flat polyp from cecum which was tubular adenoma without dysplasia.  She also had another tubular adenoma removed from the transverse colon.  Patient was advised to return for repeat exam in 3 years since cecal polypectomy was piecemeal. ?Patient has no complaints.  She denies change in bowel habits or rectal bleeding.  Last Eliquis dose was 3 days ago. ?Family history significant for colon carcinoma in a brother who was in his 12s at the time of diagnosis and died passed May 16, 2023 of unrelated causes.  She had a sister who had multiple colonic adenomas removed.  She had other health problems and died while in nursing home. ?Past Medical History:  ?Diagnosis Date  ? Adenocarcinoma, colon ([place]) 03/2005  ? Stage I; resected in 03/2005  ? Arthritis   ? Colon cancer ([place])   ? Removed surgicaly   ? Dysrhythmia   ? Gastroesophageal reflux disease   ? Gastroparesis   ? Hearing loss   ? mild  ? Hyperlipidemia   ? Irregular heartbeat   ? Ovarian cyst 2008  ? Paroxysmal atrial fibrillation ([facility])   ? Palpitations; maintained on flecainide; -normal coronary angiography and 1999; negative stress nuclear study in 11/2005  ? PONV (postoperative nausea and vomiting)   ? Tobacco abuse, in remission   ? 10-pack-years; quit in 2001  ? ? ?Past Surgical History:  ?Procedure Laterality Date  ? ABDOMINAL HYSTERECTOMY  1983  ? BACK SURGERY    ? BIOPSY  10/18/2017  ? Procedure: BIOPSY;  Surgeon: [name], MD;  [facility];  Service: Endoscopy;;  gastric  ? BIOPSY  07/06/2018  ? Procedure: BIOPSY;  Surgeon: [name], MD;  [facility];  Service: Endoscopy;;  gastric  ? CHOLECYSTECTOMY    ? COLON SURGERY  2006   ? Colon cancer  ? COLONOSCOPY  03/25/2012  ? [name], MD  ? COLONOSCOPY N/A 04/26/2017  ? Procedure: COLONOSCOPY;  Surgeon: [name], MD;  [facility];  Service: Endoscopy;  Laterality: N/A;  2:00  ? COLONOSCOPY N/A 07/06/2018  ? Procedure: COLONOSCOPY;  Surgeon: [name], MD;  [facility];  Service: Endoscopy;  Laterality: N/A;  10:30  ? COSMETIC SURGERY  09/2007  ? ESOPHAGOGASTRODUODENOSCOPY N/A 10/18/2017  ? Procedure: ESOPHAGOGASTRODUODENOSCOPY (EGD);  Surgeon: [name], MD;  [facility];  Service: Endoscopy;  Laterality: N/A;  ? ESOPHAGOGASTRODUODENOSCOPY N/A 07/06/2018  ? Procedure: ESOPHAGOGASTRODUODENOSCOPY (EGD);  Surgeon: [name], MD;  [facility];  Service: Endoscopy;  Laterality: N/A;  ? GIVENS CAPSULE STUDY N/A 12/26/2019  ? Procedure: GIVENS CAPSULE STUDY;  Surgeon: [name], MD;  [facility];  Service: Endoscopy;  Laterality: N/A;  730  ? PARTIAL COLECTOMY  2006  ? adenocarcinoma  ? POLYPECTOMY  04/26/2017  ? Procedure: POLYPECTOMY;  Surgeon: [name], MD;  [facility];  Service: Endoscopy;;  colon  ? POLYPECTOMY  07/06/2018  ? Procedure: POLYPECTOMY;  Surgeon: [name], MD;  [facility];  Service: Endoscopy;;  colon ?  ? TONSILLECTOMY    ? ? ?Family History  ?Problem Relation Age of Onset  ?  Heart disease Mother   ? Stroke Mother   ? Heart attack Mother   ? Cancer Father   ?     lung  ? Heart disease Father   ? Hypertension Father   ? Cancer Brother   ?     rectal  ? Healthy Son   ? Heart failure Sister   ? Stroke Sister   ? ?Social History:  reports that she quit smoking about 19 years ago. Her smoking use included cigarettes. She started smoking about 58 years ago. She has a 12.00 pack-year smoking history. She has never used smokeless tobacco. She reports that she does not drink alcohol and does not use drugs. ? ?Allergies: No Known  Allergies ? ?Medications Prior to Admission  ?Medication Sig Dispense Refill  ? alendronate (FOSAMAX) 70 MG tablet Take 1 tablet (70 mg total) by mouth every 7 (seven) days. Take with a full glass of water on an empty stomach. (Patient taking differently: Take 70 mg by mouth every Thursday. Take with a full glass of water on an empty stomach.) 4 tablet 11  ? apixaban (ELIQUIS) 5 MG TABS tablet Take 1 tablet (5 mg total) by mouth 2 (two) times daily. 180 tablet 3  ? Calcium Carbonate (CALCIUM 600 PO) Take 600 mg by mouth daily.    ? dexamethasone (DECADRON) 0.5 MG/5ML solution Take by mouth.    ? diclofenac Sodium (VOLTAREN) 1 % GEL Voltaren 1 % topical gel ? APPLY 4 GRAM TO THE AFFECTED AREA(S) BY TOPICAL ROUTE 4 TIMES PER DAY    ? ferrous sulfate 325 (65 FE) MG tablet Take 325 mg by mouth daily with breakfast.    ? flecainide (TAMBOCOR) 100 MG tablet Take 1 tablet (100 mg total) by mouth every 12 (twelve) hours. 180 tablet 3  ? HYDROcodone-acetaminophen (NORCO) 10-325 MG tablet Take 1 tablet by mouth 3 (three) times daily as needed. 90 tablet 0  ? HYDROcodone-acetaminophen (NORCO) 10-325 MG tablet 1 taken 3 times daily as needed for chronic pain 90 tablet 0  ? HYDROcodone-acetaminophen (NORCO) 10-325 MG tablet TAKE ONE TABLET BY MOUTH THREE TIMES A DAY AS NEEDED FOR PAIN 90 tablet 0  ? lactulose (CHRONULAC) 10 GM/15ML solution Take 20 g by mouth at bedtime.    ? midodrine (PROAMATINE) 5 MG tablet Take 1 tablet (5 mg total) by mouth 3 (three) times daily with meals. 270 tablet 2  ? omeprazole (PRILOSEC) 20 MG capsule Take 20 mg by mouth daily before breakfast.     ? rosuvastatin (CRESTOR) 40 MG tablet Take 1 tablet (40 mg total) by mouth daily. 90 tablet 3  ? vitamin B-12 (CYANOCOBALAMIN) 1000 MCG tablet Take 1,000 mcg by mouth daily.    ? ? ?No results found for this or any previous visit (from the past 48 hour(s)). ?MM 3D SCREEN BREAST BILATERAL ? ?Result Date: 09/09/2021 ?CLINICAL DATA:  Screening. EXAM: DIGITAL  SCREENING BILATERAL MAMMOGRAM WITH TOMOSYNTHESIS AND CAD TECHNIQUE: Bilateral screening digital craniocaudal and mediolateral oblique mammograms were obtained. Bilateral screening digital breast tomosynthesis was performed. The images were evaluated with computer-aided detection. COMPARISON:  Previous exam(s). ACR Breast Density Category c: The breast tissue is heterogeneously dense, which may obscure small masses. FINDINGS: There are no findings suspicious for malignancy. IMPRESSION: No mammographic evidence of malignancy. A result letter of this screening mammogram will be mailed directly to the patient. RECOMMENDATION: Screening mammogram in one year. (Code:SM-B-01Y) BI-RADS CATEGORY  1: Negative. Electronically Signed   By: Mallie Darting.D.  On: 09/09/2021 08:22   ? ?Review of Systems ? ?Blood pressure (!) 156/75, pulse 75, temperature 98 ?F (36.7 ?C), temperature source Oral, resp. rate 18, SpO2 100 %. ?Physical Exam ?HENT:  ?   Mouth/Throat:  ?   Mouth: Mucous membranes are moist.  ?   Pharynx: Oropharynx is clear.  ?Eyes:  ?   General: No scleral icterus. ?   Conjunctiva/sclera: Conjunctivae normal.  ?Cardiovascular:  ?   Rate and Rhythm: Normal rate and regular rhythm.  ?   Heart sounds: Normal heart sounds. No murmur heard. ?Pulmonary:  ?   Effort: Pulmonary effort is normal.  ?   Breath sounds: Normal breath sounds.  ?Abdominal:  ?   General: There is no distension.  ?   Palpations: Abdomen is soft. There is no mass.  ?   Tenderness: There is no abdominal tenderness.  ?   Comments: Lower midline scar  ?Musculoskeletal:  ?   Cervical back: Neck supple.  ?Lymphadenopathy:  ?   Cervical: No cervical adenopathy.  ?Neurological:  ?   Mental Status: She is alert.  ?  ? ?Assessment/Plan ? ?History of colon cancer in colonic adenomas ?Surveillance colonoscopy. ? ?Hildred Laser, MD ?09/10/2021, 9:34 AM ? ? ? ?

## 2021-09-10 NOTE — Anesthesia Preprocedure Evaluation (Signed)
Anesthesia Evaluation  ?Patient identified by MRN, date of birth, ID band ?Patient awake ? ? ? ?Reviewed: ?Allergy & Precautions, H&P , NPO status , Patient's Chart, lab work & pertinent test results, reviewed documented beta blocker date and time  ? ?History of Anesthesia Complications ?(+) PONV and history of anesthetic complications ? ?Airway ?Mallampati: II ? ?TM Distance: >3 FB ?Neck ROM: full ? ? ? Dental ?no notable dental hx. ? ?  ?Pulmonary ?neg pulmonary ROS, former smoker,  ?  ?Pulmonary exam normal ?breath sounds clear to auscultation ? ? ? ? ? ? Cardiovascular ?Exercise Tolerance: Good ?negative cardio ROS ? ? ?Rhythm:irregular Rate:Normal ? ? ?  ?Neuro/Psych ? Neuromuscular disease negative psych ROS  ? GI/Hepatic ?negative GI ROS, Neg liver ROS,   ?Endo/Other  ?negative endocrine ROS ? Renal/GU ?negative Renal ROS  ?negative genitourinary ?  ?Musculoskeletal ? ? Abdominal ?  ?Peds ? Hematology ?negative hematology ROS ?(+)   ?Anesthesia Other Findings ? ? Reproductive/Obstetrics ?negative OB ROS ? ?  ? ? ? ? ? ? ? ? ? ? ? ? ? ?  ?  ? ? ? ? ? ? ? ? ?Anesthesia Physical ?Anesthesia Plan ? ?ASA: 3 ? ?Anesthesia Plan: General  ? ?Post-op Pain Management:   ? ?Induction:  ? ?PONV Risk Score and Plan: Propofol infusion ? ?Airway Management Planned:  ? ?Additional Equipment:  ? ?Intra-op Plan:  ? ?Post-operative Plan:  ? ?Informed Consent: I have reviewed the patients History and Physical, chart, labs and discussed the procedure including the risks, benefits and alternatives for the proposed anesthesia with the patient or authorized representative who has indicated his/her understanding and acceptance.  ? ? ? ?Dental Advisory Given ? ?Plan Discussed with: CRNA ? ?Anesthesia Plan Comments:   ? ? ? ? ? ? ?Anesthesia Quick Evaluation ? ?

## 2021-09-10 NOTE — Transfer of Care (Signed)
Immediate Anesthesia Transfer of Care Note ? ?Patient: TURA ROLLER ? ?Procedure(s) Performed: COLONOSCOPY WITH PROPOFOL ?POLYPECTOMY ? ?Patient Location: Short Stay ? ?Anesthesia Type:MAC ? ?Level of Consciousness: awake, alert , oriented and patient cooperative ? ?Airway & Oxygen Therapy: Patient Spontanous Breathing ? ?Post-op Assessment: Report given to RN, Post -op Vital signs reviewed and stable and Patient moving all extremities ? ?Post vital signs: Reviewed and stable ? ?Last Vitals:  ?Vitals Value Taken Time  ?BP 131/55 09/10/21 1012  ?Temp 36.7 ?C 09/10/21 1012  ?Pulse 75 09/10/21 1012  ?Resp 16 09/10/21 1012  ?SpO2 100 % 09/10/21 1012  ? ? ?Last Pain:  ?Vitals:  ? 09/10/21 1012  ?TempSrc: Oral  ?PainSc: 7   ?   ? ?Patients Stated Pain Goal: 7 (09/10/21 7654) ? ?Complications: No notable events documented. ?

## 2021-09-12 LAB — SURGICAL PATHOLOGY

## 2021-09-16 ENCOUNTER — Ambulatory Visit: Payer: Medicare Other | Admitting: Orthopaedic Surgery

## 2021-09-16 ENCOUNTER — Encounter: Payer: Self-pay | Admitting: Orthopaedic Surgery

## 2021-09-16 VITALS — BP 122/62 | HR 66 | Ht 62.0 in | Wt 103.0 lb

## 2021-09-16 DIAGNOSIS — S42255D Nondisplaced fracture of greater tuberosity of left humerus, subsequent encounter for fracture with routine healing: Secondary | ICD-10-CM

## 2021-09-16 NOTE — Progress Notes (Signed)
I am doing OK ? ?She has been to OT and has made very good progress with the left shoulder. ? ?She has excellent ROM of the left shoulder today, NV intact. ? ?Encounter Diagnosis  ?Name Primary?  ? Nondisplaced fracture of greater tuberosity of left humerus, subsequent encounter for fracture with routine healing Yes  ? ?I will see as needed. ? ?Call if any problem. ? ?Precautions discussed. ? ?Electronically Signed ?Sanjuana Kava, MD ?5/9/20232:21 PM ? ?

## 2021-09-18 ENCOUNTER — Encounter (HOSPITAL_COMMUNITY): Payer: Self-pay | Admitting: Internal Medicine

## 2021-09-25 ENCOUNTER — Ambulatory Visit: Payer: Medicare Other | Admitting: Family Medicine

## 2021-10-01 DIAGNOSIS — I10 Essential (primary) hypertension: Secondary | ICD-10-CM | POA: Diagnosis not present

## 2021-10-01 DIAGNOSIS — E7849 Other hyperlipidemia: Secondary | ICD-10-CM | POA: Diagnosis not present

## 2021-10-01 DIAGNOSIS — Z79899 Other long term (current) drug therapy: Secondary | ICD-10-CM | POA: Diagnosis not present

## 2021-10-01 DIAGNOSIS — D509 Iron deficiency anemia, unspecified: Secondary | ICD-10-CM | POA: Diagnosis not present

## 2021-10-02 LAB — CBC WITH DIFFERENTIAL/PLATELET
Basophils Absolute: 0 10*3/uL (ref 0.0–0.2)
Basos: 0 %
EOS (ABSOLUTE): 0.1 10*3/uL (ref 0.0–0.4)
Eos: 1 %
Hematocrit: 34.3 % (ref 34.0–46.6)
Hemoglobin: 11.4 g/dL (ref 11.1–15.9)
Immature Grans (Abs): 0 10*3/uL (ref 0.0–0.1)
Immature Granulocytes: 0 %
Lymphocytes Absolute: 3.1 10*3/uL (ref 0.7–3.1)
Lymphs: 42 %
MCH: 33.5 pg — ABNORMAL HIGH (ref 26.6–33.0)
MCHC: 33.2 g/dL (ref 31.5–35.7)
MCV: 101 fL — ABNORMAL HIGH (ref 79–97)
Monocytes Absolute: 0.6 10*3/uL (ref 0.1–0.9)
Monocytes: 8 %
Neutrophils Absolute: 3.5 10*3/uL (ref 1.4–7.0)
Neutrophils: 49 %
Platelets: 291 10*3/uL (ref 150–450)
RBC: 3.4 x10E6/uL — ABNORMAL LOW (ref 3.77–5.28)
RDW: 11.8 % (ref 11.7–15.4)
WBC: 7.3 10*3/uL (ref 3.4–10.8)

## 2021-10-02 LAB — HEPATIC FUNCTION PANEL
ALT: 18 IU/L (ref 0–32)
AST: 21 IU/L (ref 0–40)
Albumin: 4.6 g/dL (ref 3.7–4.7)
Alkaline Phosphatase: 55 IU/L (ref 44–121)
Bilirubin Total: 0.4 mg/dL (ref 0.0–1.2)
Bilirubin, Direct: 0.12 mg/dL (ref 0.00–0.40)
Total Protein: 6.7 g/dL (ref 6.0–8.5)

## 2021-10-02 LAB — BASIC METABOLIC PANEL
BUN/Creatinine Ratio: 22 (ref 12–28)
BUN: 20 mg/dL (ref 8–27)
CO2: 23 mmol/L (ref 20–29)
Calcium: 9.3 mg/dL (ref 8.7–10.3)
Chloride: 105 mmol/L (ref 96–106)
Creatinine, Ser: 0.91 mg/dL (ref 0.57–1.00)
Glucose: 98 mg/dL (ref 70–99)
Potassium: 4.3 mmol/L (ref 3.5–5.2)
Sodium: 143 mmol/L (ref 134–144)
eGFR: 65 mL/min/{1.73_m2} (ref >59)

## 2021-10-02 LAB — LIPID PANEL
Chol/HDL Ratio: 2.3 ratio (ref 0.0–4.4)
Cholesterol, Total: 169 mg/dL (ref 100–199)
HDL: 73 mg/dL (ref >39)
LDL Chol Calc (NIH): 75 mg/dL (ref 0–99)
Triglycerides: 120 mg/dL (ref 0–149)
VLDL Cholesterol Cal: 21 mg/dL (ref 5–40)

## 2021-10-08 ENCOUNTER — Ambulatory Visit (INDEPENDENT_AMBULATORY_CARE_PROVIDER_SITE_OTHER): Payer: Medicare Other | Admitting: Family Medicine

## 2021-10-08 VITALS — BP 138/62 | HR 60 | Temp 97.9°F | Ht 62.0 in | Wt 104.2 lb

## 2021-10-08 DIAGNOSIS — I10 Essential (primary) hypertension: Secondary | ICD-10-CM

## 2021-10-08 DIAGNOSIS — Z5181 Encounter for therapeutic drug level monitoring: Secondary | ICD-10-CM

## 2021-10-08 DIAGNOSIS — E7849 Other hyperlipidemia: Secondary | ICD-10-CM | POA: Diagnosis not present

## 2021-10-08 DIAGNOSIS — Z79891 Long term (current) use of opiate analgesic: Secondary | ICD-10-CM

## 2021-10-08 DIAGNOSIS — M8580 Other specified disorders of bone density and structure, unspecified site: Secondary | ICD-10-CM

## 2021-10-08 DIAGNOSIS — D539 Nutritional anemia, unspecified: Secondary | ICD-10-CM | POA: Diagnosis not present

## 2021-10-08 MED ORDER — HYDROCODONE-ACETAMINOPHEN 10-325 MG PO TABS
1.0000 | ORAL_TABLET | Freq: Three times a day (TID) | ORAL | 0 refills | Status: DC | PRN
Start: 2021-10-08 — End: 2022-01-18

## 2021-10-08 MED ORDER — HYDROCODONE-ACETAMINOPHEN 10-325 MG PO TABS
ORAL_TABLET | ORAL | 0 refills | Status: DC
Start: 2021-10-08 — End: 2022-01-18

## 2021-10-08 MED ORDER — HYDROCODONE-ACETAMINOPHEN 10-325 MG PO TABS: ORAL_TABLET | ORAL | 0 refills | Status: DC

## 2021-10-08 NOTE — Progress Notes (Signed)
Subjective:    Patient ID: [name], female    DOB: [birth year], 81 y.o.   MRN: [id number]  HPI  Patient here for 3 month follow up. She would like a copy of lab work. This patient was seen today for chronic pain  The medication list was reviewed and updated.  Location of Pain for which the patient has been treated with regarding narcotics: Previous back surgery, long-term chronic pain  Onset of this pain: Present for years   -Compliance with medication: Good compliance  - Number patient states they take daily: 3 daily  -when was the last dose patient took?  Earlier today  The patient was advised the importance of maintaining medication and not using illegal substances with these.  Here for refills and follow up  The patient was educated that we can provide 3 monthly scripts for their medication, it is their responsibility to follow the instructions.  Side effects or complications from medications: Denies side effects  Patient is aware that pain medications are meant to minimize the severity of the pain to allow their pain levels to improve to allow for better function. They are aware of that pain medications cannot totally remove their pain.  Due for UDT ( at least once per year) :   Scale of 1 to 10 ( 1 is least 10 is most) Your pain level without the medicine: 8 Your pain level with medication 4  Scale 1 to 10 ( 1-helps very little, 10 helps very well) How well does your pain medication reduce your pain so you can function better through out the day? 8  Quality of the pain: Burning aching throbbing  Persistence of the pain: Present all the time  Modifying factors: Worse with activity  Lab work reviewed with patient today  Hypertension, unspecified type - Plan: Vitamin B12, CBC with Differential  Other hyperlipidemia - Plan: Vitamin B12, CBC with Differential  Macrocytic anemia - Plan: Vitamin B12, CBC with Differential  Encounter for monitoring opioid  maintenance therapy - Plan: Vitamin B12, CBC with Differential  Osteopenia, unspecified location - Plan: Vitamin B12, CBC with Differential       Review of Systems     Objective:   Physical Exam General-in no acute distress Eyes-no discharge Lungs-respiratory rate normal, CTA CV-no murmurs,RRR Extremities skin warm dry no edema Neuro grossly normal Behavior normal, alert        Assessment & Plan:  1. Hypertension, unspecified type Blood pressure good control continue current measures watch diet, diet controlled - Vitamin B12 - CBC with Differential  2. Other hyperlipidemia Cholesterol under good control continue current measures - Vitamin B12 - CBC with Differential  3. Macrocytic anemia Stable hemoglobin yet elevated MCV continue vitamin B12 check B12 level on next lab work - Vitamin B12 - CBC with Differential  4. Encounter for monitoring opioid maintenance therapy The patient was seen in followup for chronic pain. A review over at their current pain status was discussed. Drug registry was checked. Prescriptions were given.  Regular follow-up recommended. Discussion was held regarding the importance of compliance with medication as well as pain medication contract.  Patient was informed that medication may cause drowsiness and should not be combined  with other medications/alcohol or street drugs. If the patient feels medication is causing altered alertness then do not drive or operate dangerous equipment.  Should be noted that the patient appears to be meeting appropriate use of opioids and response.  Evidenced by improved function and  decent pain control without significant side effects and no evidence of overt aberrancy issues.  Upon discussion with the patient today they understand that opioid therapy is optional and they feel that the pain has been refractory to reasonable conservative measures and is significant and affecting quality of life enough to warrant  ongoing therapy and wishes to continue opioids.  Refills were provided.  - Vitamin B12 - CBC with Differential  5. Osteopenia, unspecified location Bone density to be done in September - Vitamin B12 - CBC with Differential  Follow-up in approximately 3 months

## 2021-10-27 DIAGNOSIS — H01004 Unspecified blepharitis left upper eyelid: Secondary | ICD-10-CM | POA: Diagnosis not present

## 2021-10-27 DIAGNOSIS — H01001 Unspecified blepharitis right upper eyelid: Secondary | ICD-10-CM | POA: Diagnosis not present

## 2021-10-27 DIAGNOSIS — H25813 Combined forms of age-related cataract, bilateral: Secondary | ICD-10-CM | POA: Diagnosis not present

## 2021-11-13 ENCOUNTER — Other Ambulatory Visit: Payer: Self-pay | Admitting: *Deleted

## 2021-11-13 DIAGNOSIS — N949 Unspecified condition associated with female genital organs and menstrual cycle: Secondary | ICD-10-CM

## 2021-11-13 DIAGNOSIS — R1900 Intra-abdominal and pelvic swelling, mass and lump, unspecified site: Secondary | ICD-10-CM

## 2021-11-26 ENCOUNTER — Ambulatory Visit (HOSPITAL_COMMUNITY)
Admission: RE | Admit: 2021-11-26 | Discharge: 2021-11-26 | Disposition: A | Discharge status: Home / Self Care | Payer: Medicare Other | Source: Ambulatory Visit | Attending: Family Medicine | Admitting: Family Medicine

## 2021-11-26 DIAGNOSIS — N949 Unspecified condition associated with female genital organs and menstrual cycle: Secondary | ICD-10-CM | POA: Diagnosis present

## 2021-11-26 DIAGNOSIS — R1900 Intra-abdominal and pelvic swelling, mass and lump, unspecified site: Secondary | ICD-10-CM | POA: Diagnosis not present

## 2021-12-15 ENCOUNTER — Encounter (HOSPITAL_COMMUNITY)
Admission: RE | Admit: 2021-12-15 | Discharge: 2021-12-15 | Disposition: A | Discharge status: Home / Self Care | Payer: Medicare Other | Source: Ambulatory Visit | Attending: Ophthalmology | Admitting: Ophthalmology

## 2021-12-15 DIAGNOSIS — H25811 Combined forms of age-related cataract, right eye: Secondary | ICD-10-CM | POA: Diagnosis not present

## 2021-12-16 NOTE — H&P (Signed)
Surgical History & Physical  Patient Name: [name] DOB: [birth year]  Surgery: Cataract extraction with intraocular lens implant phacoemulsification; Right Eye  Surgeon: Baruch Goldmann MD Surgery Date:  12-22-21 Pre-Op Date:  12-15-21  HPI: A 89 Yr. old female patient The patient is here for a cataract evaluation of both eyes, referred by Dr. Jorja Loa. The affected area is worsening. The patient's vision is blurry. The complaint is associated with difficulty reading small print on medicine bottles/labels, seeing captions on tv, and difficulty driving at night due to halos/glare. This is negatively affecting the patient's quality of life and the patient is unable to function adequately in life with the current level of vision. HPI Completed by Dr. Baruch Goldmann  Medical History: Cataracts Colon cancer, Back pain, GERD Heart Problem  Review of Systems Allergic/Immunologic Seasonal Allergies All recorded systems are negative except as noted above.  Social   Former smoker   Medication  Alendronate, Apixaban, Calcium Supplement, Flecainide, Iron, Omeprazole, Rosuvastatin, Hydrocodone, Vitamin B-12,   Sx/Procedures  Back Surgery, Colon Ca Sx,   Drug Allergies   NKDA  History & Physical: Heent: Cataract, right eye NECK: supple without bruits LUNGS: lungs clear to auscultation CV: regular rate and rhythm Abdomen: soft and non-tender Impression & Plan: Assessment: 1.  COMBINED FORMS AGE RELATED CATARACT; Both Eyes (H25.813) 2.  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  Pinguecula; Both Eyes (H11.153) 4.  ASTIGMATISM, REGULAR; Both Eyes (H52.223)  Plan: 1.  Cataract accounts for the patient's decreased vision. This visual impairment is not correctable with a tolerable change in glasses or contact lenses. Cataract surgery with an implantation of a new lens should significantly improve the visual and functional status of the patient. Discussed all risks,  benefits, alternatives, and potential complications. Discussed the procedures and recovery. Patient desires to have surgery. A-scan ordered and performed today for intra-ocular lens calculations. The surgery will be performed in order to improve vision for driving, reading, and for eye examinations. Recommend phacoemulsification with intra-ocular lens. Recommend Dextenza for post-operative pain and inflammation. Right Eye worse and non-dominant - first.. Dilates well - shugarcaine by protocol. Synergy IOL.  2.  Blepharitis- Recommend Artificial tears 4 x day and warm compresses with lid scrubs 2-3 x week. Patient may also benefit from omega-3 vitamin and tear gel/ointment at night. Avoid directing fans or vents on the eyes and consider a humidifier in the winter months. Patient understands this may not cure the problem and may need to be on a maintenance regimen to control the problem.  3.  Observe; Artificial tears as needed for irritation.  4.  Worse OS - possible Symfony toric IOL OS.

## 2021-12-22 ENCOUNTER — Encounter (HOSPITAL_COMMUNITY)
Admission: RE | Disposition: A | Discharge status: Home / Self Care | Payer: Self-pay | Source: Home / Self Care | Attending: Ophthalmology

## 2021-12-22 ENCOUNTER — Ambulatory Visit (HOSPITAL_COMMUNITY)
Admission: RE | Admit: 2021-12-22 | Discharge: 2021-12-22 | Disposition: A | Discharge status: Home / Self Care | Payer: Medicare Other | Attending: Ophthalmology | Admitting: Ophthalmology

## 2021-12-22 ENCOUNTER — Encounter (HOSPITAL_COMMUNITY): Payer: Self-pay | Admitting: Ophthalmology

## 2021-12-22 ENCOUNTER — Ambulatory Visit (HOSPITAL_COMMUNITY): Payer: Medicare Other | Admitting: Anesthesiology

## 2021-12-22 ENCOUNTER — Ambulatory Visit (HOSPITAL_BASED_OUTPATIENT_CLINIC_OR_DEPARTMENT_OTHER): Payer: Medicare Other | Admitting: Anesthesiology

## 2021-12-22 DIAGNOSIS — Z87891 Personal history of nicotine dependence: Secondary | ICD-10-CM

## 2021-12-22 DIAGNOSIS — G709 Myoneural disorder, unspecified: Secondary | ICD-10-CM | POA: Diagnosis not present

## 2021-12-22 DIAGNOSIS — I4891 Unspecified atrial fibrillation: Secondary | ICD-10-CM | POA: Diagnosis not present

## 2021-12-22 DIAGNOSIS — H269 Unspecified cataract: Secondary | ICD-10-CM

## 2021-12-22 DIAGNOSIS — H25811 Combined forms of age-related cataract, right eye: Secondary | ICD-10-CM

## 2021-12-22 DIAGNOSIS — H52223 Regular astigmatism, bilateral: Secondary | ICD-10-CM | POA: Insufficient documentation

## 2021-12-22 DIAGNOSIS — H01004 Unspecified blepharitis left upper eyelid: Secondary | ICD-10-CM | POA: Insufficient documentation

## 2021-12-22 DIAGNOSIS — H11153 Pinguecula, bilateral: Secondary | ICD-10-CM | POA: Insufficient documentation

## 2021-12-22 DIAGNOSIS — M1990 Unspecified osteoarthritis, unspecified site: Secondary | ICD-10-CM

## 2021-12-22 DIAGNOSIS — H01001 Unspecified blepharitis right upper eyelid: Secondary | ICD-10-CM | POA: Insufficient documentation

## 2021-12-22 HISTORY — PX: CATARACT EXTRACTION W/PHACO: SHX586

## 2021-12-22 SURGERY — PHACOEMULSIFICATION, CATARACT, WITH IOL INSERTION
Anesthesia: Monitor Anesthesia Care | Site: Eye | Laterality: Right

## 2021-12-22 MED ORDER — NEOMYCIN-POLYMYXIN-DEXAMETH 3.5-10000-0.1 OP SUSP
OPHTHALMIC | Status: DC | PRN
  Administered 2021-12-22: 1 [drp] via OPHTHALMIC

## 2021-12-22 MED ORDER — PHENYLEPHRINE HCL 2.5 % OP SOLN
1.0000 [drp] | OPHTHALMIC | Status: AC | PRN
  Administered 2021-12-22 (×3): 1 [drp] via OPHTHALMIC

## 2021-12-22 MED ORDER — LIDOCAINE HCL 3.5 % OP GEL
1.0000 | Freq: Once | OPHTHALMIC | Status: AC
  Administered 2021-12-22: 1 via OPHTHALMIC

## 2021-12-22 MED ORDER — MIDAZOLAM HCL 5 MG/5ML IJ SOLN
INTRAMUSCULAR | Status: DC | PRN
  Administered 2021-12-22: 1 mg via INTRAVENOUS
  Administered 2021-12-22: .5 mg via INTRAVENOUS

## 2021-12-22 MED ORDER — TROPICAMIDE 1 % OP SOLN
1.0000 [drp] | OPHTHALMIC | Status: AC | PRN
  Administered 2021-12-22 (×3): 1 [drp] via OPHTHALMIC

## 2021-12-22 MED ORDER — BSS IO SOLN
INTRAOCULAR | Status: DC | PRN
  Administered 2021-12-22: 15 mL via INTRAOCULAR

## 2021-12-22 MED ORDER — EPINEPHRINE PF 1 MG/ML IJ SOLN
INTRAMUSCULAR | Status: AC
  Filled 2021-12-22: qty 2

## 2021-12-22 MED ORDER — LIDOCAINE HCL (PF) 1 % IJ SOLN
INTRAOCULAR | Status: DC | PRN
  Administered 2021-12-22: 1 mL via OPHTHALMIC

## 2021-12-22 MED ORDER — SODIUM HYALURONATE 23MG/ML IO SOSY
PREFILLED_SYRINGE | INTRAOCULAR | Status: DC | PRN
  Administered 2021-12-22: 0.6 mL via INTRAOCULAR

## 2021-12-22 MED ORDER — EPINEPHRINE PF 1 MG/ML IJ SOLN
INTRAOCULAR | Status: DC | PRN
  Administered 2021-12-22: 500 mL

## 2021-12-22 MED ORDER — SODIUM HYALURONATE 10 MG/ML IO SOLUTION
PREFILLED_SYRINGE | INTRAOCULAR | Status: DC | PRN
  Administered 2021-12-22: 0.85 mL via INTRAOCULAR

## 2021-12-22 MED ORDER — TETRACAINE HCL 0.5 % OP SOLN
1.0000 [drp] | OPHTHALMIC | Status: AC | PRN
  Administered 2021-12-22 (×3): 1 [drp] via OPHTHALMIC

## 2021-12-22 MED ORDER — MIDAZOLAM HCL 2 MG/2ML IJ SOLN
INTRAMUSCULAR | Status: AC
  Filled 2021-12-22: qty 2

## 2021-12-22 MED ORDER — STERILE WATER FOR IRRIGATION IR SOLN
Status: DC | PRN
  Administered 2021-12-22: 250 mL

## 2021-12-22 MED ORDER — POVIDONE-IODINE 5 % OP SOLN
OPHTHALMIC | Status: DC | PRN
  Administered 2021-12-22: 1 via OPHTHALMIC

## 2021-12-22 SURGICAL SUPPLY — 12 items
CATARACT SUITE SIGHTPATH (MISCELLANEOUS) ×2 IMPLANT
CLOTH BEACON ORANGE TIMEOUT ST (SAFETY) ×2 IMPLANT
EYE SHIELD UNIVERSAL CLEAR (GAUZE/BANDAGES/DRESSINGS) ×1 IMPLANT
FEE CATARACT SUITE SIGHTPATH (MISCELLANEOUS) ×1 IMPLANT
GLOVE BIOGEL PI IND STRL 7.0 (GLOVE) ×2 IMPLANT
GLOVE BIOGEL PI INDICATOR 7.0 (GLOVE) ×2
LENS IOL TECNIS SYNERGY 21.0 ×1 IMPLANT
PAD ARMBOARD 7.5X6 YLW CONV (MISCELLANEOUS) ×2 IMPLANT
SYR TB 1ML LL NO SAFETY (SYRINGE) ×2 IMPLANT
TAPE SURG TRANSPORE 1 IN (GAUZE/BANDAGES/DRESSINGS) IMPLANT
TAPE SURGICAL TRANSPORE 1 IN (GAUZE/BANDAGES/DRESSINGS) ×2
WATER STERILE IRR 250ML POUR (IV SOLUTION) ×2 IMPLANT

## 2021-12-22 NOTE — Interval H&P Note (Signed)
History and Physical Interval Note:  12/22/2021 8:57 AM  [name]  has presented today for surgery, with the diagnosis of combined forms age related cataract; right.  The various methods of treatment have been discussed with the patient and family. After consideration of risks, benefits and other options for treatment, the patient has consented to  Procedure(s) with comments: CATARACT EXTRACTION PHACO AND INTRAOCULAR LENS PLACEMENT (IOC) (Right) - CDE:  as a surgical intervention.  The patient's history has been reviewed, patient examined, no change in status, stable for surgery.  I have reviewed the patient's chart and labs.  Questions were answered to the patient's satisfaction.     Baruch Goldmann

## 2021-12-22 NOTE — Anesthesia Preprocedure Evaluation (Signed)
Anesthesia Evaluation  Patient identified by MRN, date of birth, ID band Patient awake    Reviewed: Allergy & Precautions, NPO status , Patient's Chart, lab work & pertinent test results  History of Anesthesia Complications (+) PONV and history of anesthetic complications  Airway Mallampati: III  TM Distance: >3 FB Neck ROM: Full    Dental  (+) Dental Advisory Given, Teeth Intact   Pulmonary former smoker,    Pulmonary exam normal breath sounds clear to auscultation       Cardiovascular Exercise Tolerance: Good Normal cardiovascular exam+ dysrhythmias Atrial Fibrillation  Rate:Normal     Neuro/Psych  Neuromuscular disease negative psych ROS   GI/Hepatic Neg liver ROS, GERD (gastroparesis )  Medicated and Controlled,Colon cancer   Endo/Other  negative endocrine ROS  Renal/GU negative Renal ROS  negative genitourinary   Musculoskeletal  (+) Arthritis , Osteoarthritis,    Abdominal   Peds negative pediatric ROS (+)  Hematology negative hematology ROS (+)   Anesthesia Other Findings Left sided sciatica  Reproductive/Obstetrics negative OB ROS                             Anesthesia Physical Anesthesia Plan  ASA: 3  Anesthesia Plan: MAC   Post-op Pain Management: Minimal or no pain anticipated   Induction: Intravenous  PONV Risk Score and Plan:   Airway Management Planned: Nasal Cannula and Natural Airway  Additional Equipment:   Intra-op Plan:   Post-operative Plan:   Informed Consent: I have reviewed the patients History and Physical, chart, labs and discussed the procedure including the risks, benefits and alternatives for the proposed anesthesia with the patient or authorized representative who has indicated his/her understanding and acceptance.       Plan Discussed with: CRNA and Surgeon  Anesthesia Plan Comments:         Anesthesia Quick Evaluation

## 2021-12-22 NOTE — Transfer of Care (Addendum)
138/50Immediate Anesthesia Transfer of Care Note  Patient: [name]  Procedure(s) Performed: CATARACT EXTRACTION PHACO AND INTRAOCULAR LENS PLACEMENT (IOC) (Right: Eye)  Patient Location: Short Stay  Anesthesia Type:MAC  Level of Consciousness: awake  Airway & Oxygen Therapy: Patient Spontanous Breathing  Post-op Assessment: Report given to RN  Post vital signs: Reviewed and stable  Last Vitals:  Vitals Value Taken Time  BP 138/50    Temp 36.6   Pulse 62   Resp 11   SpO2 100     Last Pain:  Vitals:   12/22/21 0813  TempSrc: Oral  PainSc: 8       Patients Stated Pain Goal: 9 (49/61/16 4353)  Complications: No notable events documented.

## 2021-12-22 NOTE — Discharge Instructions (Signed)
Please discharge patient when stable, will follow up today with Dr. Dawnn Nam at the [facility] [place] office at 10:20AM following discharge.  Leave shield in place until visit.  All paperwork with discharge instructions will be given at the office.  [facility] [place] Address:  [address]  [place], [place] [address]  

## 2021-12-22 NOTE — Anesthesia Postprocedure Evaluation (Signed)
Anesthesia Post Note  Patient: [name]  Procedure(s) Performed: CATARACT EXTRACTION PHACO AND INTRAOCULAR LENS PLACEMENT (IOC) (Right: Eye)  Patient location during evaluation: Short Stay Anesthesia Type: MAC Level of consciousness: awake and alert Pain management: pain level controlled Vital Signs Assessment: post-procedure vital signs reviewed and stable Respiratory status: spontaneous breathing Cardiovascular status: blood pressure returned to baseline and stable Postop Assessment: no apparent nausea or vomiting Anesthetic complications: no   No notable events documented.   Last Vitals:  Vitals:   12/22/21 0813 12/22/21 0920  BP: (!) 182/63 (!) 138/50  Pulse: 64 62  Resp: 20 11  Temp: 36.5 C 36.6 C  SpO2: 100% 100%    Last Pain:  Vitals:   12/22/21 0920  TempSrc: Oral  PainSc: 0-No pain                 Manessa Buley

## 2021-12-22 NOTE — Op Note (Signed)
Date of procedure: 12/22/21  Pre-operative diagnosis:  Visually significant combined form age-related cataract, Right Eye (H25.811)  Post-operative diagnosis:  Visually significant combined form age-related cataract, Right Eye (H25.811)  Procedure: Removal of cataract via phacoemulsification and insertion of intra-ocular lens Wynetta Emery and Johnson DFR00V +21.0D into the capsular bag of the Right Eye  Attending surgeon: Gerda Diss. Shariff Lasky, MD, MA  Anesthesia: MAC, Topical Akten  Complications: None  Estimated Blood Loss: <24m (minimal)  Specimens: None  Implants: As above  Indications:  Visually significant age-related cataract, Right Eye  Procedure:  The patient was seen and identified in the pre-operative area. The operative eye was identified and dilated.  The operative eye was marked.  Topical anesthesia was administered to the operative eye.     The patient was then to the operative suite and placed in the supine position.  A timeout was performed confirming the patient, procedure to be performed, and all other relevant information.   The patient's face was prepped and draped in the usual fashion for intra-ocular surgery.  A lid speculum was placed into the operative eye and the surgical microscope moved into place and focused.  A superotemporal paracentesis was created using a 20 gauge paracentesis blade.  Shugarcaine was injected into the anterior chamber.  Viscoelastic was injected into the anterior chamber.  A temporal clear-corneal main wound incision was created using a 2.437mmicrokeratome.  A continuous curvilinear capsulorrhexis was initiated using an irrigating cystitome and completed using capsulorrhexis forceps.  Hydrodissection and hydrodeliniation were performed.  Viscoelastic was injected into the anterior chamber.  A phacoemulsification handpiece and a chopper as a second instrument were used to remove the nucleus and epinucleus. The irrigation/aspiration handpiece was used to  remove any remaining cortical material.   The capsular bag was reinflated with viscoelastic, checked, and found to be intact.  The intraocular lens was inserted into the capsular bag.  The irrigation/aspiration handpiece was used to remove any remaining viscoelastic.  The clear corneal wound and paracentesis wounds were then hydrated and checked with Weck-Cels to be watertight.  The lid-speculum was removed.  The drape was removed.  The patient's face was cleaned with a wet and dry 4x4.   Maxitrol was instilled in the eye. A clear shield was taped over the eye. The patient was taken to the post-operative care unit in good condition, having tolerated the procedure well.  Post-Op Instructions: [facility] and will receive all other orders and instructions.

## 2021-12-23 ENCOUNTER — Encounter (HOSPITAL_COMMUNITY): Payer: Self-pay | Admitting: Ophthalmology

## 2021-12-29 DIAGNOSIS — Z5181 Encounter for therapeutic drug level monitoring: Secondary | ICD-10-CM | POA: Diagnosis not present

## 2021-12-29 DIAGNOSIS — E7849 Other hyperlipidemia: Secondary | ICD-10-CM | POA: Diagnosis not present

## 2021-12-29 DIAGNOSIS — I10 Essential (primary) hypertension: Secondary | ICD-10-CM | POA: Diagnosis not present

## 2021-12-29 DIAGNOSIS — Z79891 Long term (current) use of opiate analgesic: Secondary | ICD-10-CM | POA: Diagnosis not present

## 2021-12-29 DIAGNOSIS — D539 Nutritional anemia, unspecified: Secondary | ICD-10-CM | POA: Diagnosis not present

## 2021-12-29 DIAGNOSIS — H25812 Combined forms of age-related cataract, left eye: Secondary | ICD-10-CM | POA: Diagnosis not present

## 2021-12-30 ENCOUNTER — Encounter (HOSPITAL_COMMUNITY)
Admission: RE | Admit: 2021-12-30 | Discharge: 2021-12-30 | Disposition: A | Discharge status: Home / Self Care | Payer: Medicare Other | Source: Ambulatory Visit | Attending: Ophthalmology | Admitting: Ophthalmology

## 2021-12-30 LAB — CBC WITH DIFFERENTIAL/PLATELET
Basophils Absolute: 0 10*3/uL (ref 0.0–0.2)
Basos: 0 %
EOS (ABSOLUTE): 0.1 10*3/uL (ref 0.0–0.4)
Eos: 2 %
Hematocrit: 32.4 % — ABNORMAL LOW (ref 34.0–46.6)
Hemoglobin: 10.5 g/dL — ABNORMAL LOW (ref 11.1–15.9)
Immature Grans (Abs): 0 10*3/uL (ref 0.0–0.1)
Immature Granulocytes: 0 %
Lymphocytes Absolute: 2.4 10*3/uL (ref 0.7–3.1)
Lymphs: 37 %
MCH: 32.9 pg (ref 26.6–33.0)
MCHC: 32.4 g/dL (ref 31.5–35.7)
MCV: 102 fL — ABNORMAL HIGH (ref 79–97)
Monocytes Absolute: 0.5 10*3/uL (ref 0.1–0.9)
Monocytes: 8 %
Neutrophils Absolute: 3.4 10*3/uL (ref 1.4–7.0)
Neutrophils: 53 %
Platelets: 274 10*3/uL (ref 150–450)
RBC: 3.19 x10E6/uL — ABNORMAL LOW (ref 3.77–5.28)
RDW: 11.5 % — ABNORMAL LOW (ref 11.7–15.4)
WBC: 6.5 10*3/uL (ref 3.4–10.8)

## 2021-12-30 LAB — VITAMIN B12: Vitamin B-12: 1299 pg/mL — ABNORMAL HIGH (ref 232–1245)

## 2021-12-30 NOTE — Pre-Procedure Instructions (Signed)
Attempted pre-op phone call. Phone rang with no VM to leave a message.

## 2022-01-02 NOTE — H&P (Signed)
Surgical History & Physical  Patient Name: [name] DOB: Sep 20, 1940  Surgery: Cataract extraction with intraocular lens implant phacoemulsification; Left Eye  Surgeon: Baruch Goldmann MD Surgery Date:  01-05-22 Pre-Op Date:  12-29-21  HPI: A 19 Yr. old female patient present for 1 week post op OD and preop OS sx 01/05/2022. Patient states she is doing well with OD. She wants to know when she will start seeing better. Using combo drop and will start BID today OS. 1. 1. The patient complains of difficulty when viewing TV, reading closed caption, news scrolls on TV, which began years ago. The left eye is affected. The episode is constant. The condition's severity is worsening. This is negatively affecting the patient's quality of life and the patient is unable to function adequately in life with the current level of vision. HPI Completed by Dr. Baruch Goldmann  Medical History: Cataracts Colon cancer, Back pain, GERD Heart Problem  Review of Systems Allergic/Immunologic Seasonal Allergies All recorded systems are negative except as noted above.  Social   Former smoker   Medication Prednisolone-Moxifloxacin-Bromfenac,  Alendronate, Apixaban, Calcium Supplement, Flecainide, Iron, Omeprazole, Rosuvastatin, Hydrocodone, Vitamin B-12,   Sx/Procedures Phaco c IOL OD Synergy IOL,  Back Surgery, Colon Ca Sx,   Drug Allergies   NKDA  History & Physical: Heent: Cataract, left eye NECK: supple without bruits LUNGS: lungs clear to auscultation CV: regular rate and rhythm Abdomen: soft and non-tender Impression & Plan: Assessment: 1.  CATARACT EXTRACTION STATUS; Right Eye (Z98.41) 2.  COMBINED FORMS AGE RELATED CATARACT; Left Eye (H25.812)  Plan: 1.  1 week after cataract surgery. Doing well with improved vision and normal eye pressure. Call with any problems or concerns. Continue Pred-Moxi-Brom 2x/day for 3 more weeks.  2.  Cataract accounts for the patient's decreased vision. This  visual impairment is not correctable with a tolerable change in glasses or contact lenses. Cataract surgery with an implantation of a new lens should significantly improve the visual and functional status of the patient. Discussed all risks, benefits, alternatives, and potential complications. Discussed the procedures and recovery. Patient desires to have surgery. A-scan ordered and performed today for intra-ocular lens calculations. The surgery will be performed in order to improve vision for driving, reading, and for eye examinations. Recommend phacoemulsification with intra-ocular lens. Recommend Dextenza for post-operative pain and inflammation. Left Eye. Surgery required to correct imbalance of vision. Dilates well - shugarcaine by protocol. Synergy Lens.

## 2022-01-05 ENCOUNTER — Encounter (HOSPITAL_COMMUNITY)
Admission: RE | Disposition: A | Discharge status: Home / Self Care | Payer: Self-pay | Source: Home / Self Care | Attending: Ophthalmology

## 2022-01-05 ENCOUNTER — Other Ambulatory Visit: Payer: Self-pay | Admitting: Family Medicine

## 2022-01-05 ENCOUNTER — Ambulatory Visit (HOSPITAL_COMMUNITY)
Admission: RE | Admit: 2022-01-05 | Discharge: 2022-01-05 | Disposition: A | Discharge status: Home / Self Care | Payer: Medicare Other | Attending: Ophthalmology | Admitting: Ophthalmology

## 2022-01-05 ENCOUNTER — Ambulatory Visit (HOSPITAL_COMMUNITY): Payer: Medicare Other | Admitting: Anesthesiology

## 2022-01-05 ENCOUNTER — Ambulatory Visit (HOSPITAL_BASED_OUTPATIENT_CLINIC_OR_DEPARTMENT_OTHER): Payer: Medicare Other | Admitting: Anesthesiology

## 2022-01-05 DIAGNOSIS — D649 Anemia, unspecified: Secondary | ICD-10-CM

## 2022-01-05 DIAGNOSIS — G709 Myoneural disorder, unspecified: Secondary | ICD-10-CM | POA: Insufficient documentation

## 2022-01-05 DIAGNOSIS — K219 Gastro-esophageal reflux disease without esophagitis: Secondary | ICD-10-CM | POA: Diagnosis not present

## 2022-01-05 DIAGNOSIS — I4891 Unspecified atrial fibrillation: Secondary | ICD-10-CM | POA: Diagnosis not present

## 2022-01-05 DIAGNOSIS — Z7901 Long term (current) use of anticoagulants: Secondary | ICD-10-CM | POA: Diagnosis not present

## 2022-01-05 DIAGNOSIS — H25812 Combined forms of age-related cataract, left eye: Secondary | ICD-10-CM | POA: Insufficient documentation

## 2022-01-05 DIAGNOSIS — Z87891 Personal history of nicotine dependence: Secondary | ICD-10-CM | POA: Diagnosis not present

## 2022-01-05 DIAGNOSIS — Z9841 Cataract extraction status, right eye: Secondary | ICD-10-CM | POA: Insufficient documentation

## 2022-01-05 HISTORY — PX: CATARACT EXTRACTION W/PHACO: SHX586

## 2022-01-05 LAB — IFOBT (OCCULT BLOOD): IFOBT: POSITIVE

## 2022-01-05 SURGERY — PHACOEMULSIFICATION, CATARACT, WITH IOL INSERTION
Anesthesia: General | Site: Eye | Laterality: Left

## 2022-01-05 MED ORDER — FENTANYL CITRATE (PF) 100 MCG/2ML IJ SOLN
INTRAMUSCULAR | Status: DC | PRN
  Administered 2022-01-05: 50 ug via INTRAVENOUS

## 2022-01-05 MED ORDER — EPINEPHRINE PF 1 MG/ML IJ SOLN
INTRAMUSCULAR | Status: AC
  Filled 2022-01-05: qty 2

## 2022-01-05 MED ORDER — BSS IO SOLN
INTRAOCULAR | Status: DC | PRN
  Administered 2022-01-05: 15 mL via INTRAOCULAR

## 2022-01-05 MED ORDER — TETRACAINE HCL 0.5 % OP SOLN
1.0000 [drp] | OPHTHALMIC | Status: DC | PRN
  Administered 2022-01-05 (×2): 1 [drp] via OPHTHALMIC

## 2022-01-05 MED ORDER — MIDAZOLAM HCL 2 MG/2ML IJ SOLN
INTRAMUSCULAR | Status: AC
  Filled 2022-01-05: qty 2

## 2022-01-05 MED ORDER — PHENYLEPHRINE HCL 2.5 % OP SOLN
1.0000 [drp] | OPHTHALMIC | Status: DC | PRN
  Administered 2022-01-05 (×2): 1 [drp] via OPHTHALMIC

## 2022-01-05 MED ORDER — FENTANYL CITRATE (PF) 100 MCG/2ML IJ SOLN
INTRAMUSCULAR | Status: AC
  Filled 2022-01-05: qty 2

## 2022-01-05 MED ORDER — POVIDONE-IODINE 5 % OP SOLN
OPHTHALMIC | Status: DC | PRN
  Administered 2022-01-05: 1 via OPHTHALMIC

## 2022-01-05 MED ORDER — MIDAZOLAM HCL 5 MG/5ML IJ SOLN
INTRAMUSCULAR | Status: DC | PRN
  Administered 2022-01-05: 1 mg via INTRAVENOUS

## 2022-01-05 MED ORDER — TROPICAMIDE 1 % OP SOLN
1.0000 [drp] | OPHTHALMIC | Status: DC | PRN
  Administered 2022-01-05 (×2): 1 [drp] via OPHTHALMIC

## 2022-01-05 MED ORDER — LIDOCAINE HCL 3.5 % OP GEL: 1.0000 | Freq: Once | OPHTHALMIC | Status: DC

## 2022-01-05 MED ORDER — EPINEPHRINE PF 1 MG/ML IJ SOLN
INTRAOCULAR | Status: DC | PRN
  Administered 2022-01-05: 500 mL

## 2022-01-05 MED ORDER — LIDOCAINE HCL (PF) 1 % IJ SOLN
INTRAOCULAR | Status: DC | PRN
  Administered 2022-01-05: 1 mL via OPHTHALMIC

## 2022-01-05 MED ORDER — SODIUM HYALURONATE 23MG/ML IO SOSY
PREFILLED_SYRINGE | INTRAOCULAR | Status: DC | PRN
  Administered 2022-01-05: 0.6 mL via INTRAOCULAR

## 2022-01-05 MED ORDER — STERILE WATER FOR IRRIGATION IR SOLN
Status: DC | PRN
  Administered 2022-01-05: 250 mL

## 2022-01-05 MED ORDER — SODIUM HYALURONATE 10 MG/ML IO SOLUTION
PREFILLED_SYRINGE | INTRAOCULAR | Status: DC | PRN
  Administered 2022-01-05: 0.85 mL via INTRAOCULAR

## 2022-01-05 MED ORDER — NEOMYCIN-POLYMYXIN-DEXAMETH 3.5-10000-0.1 OP SUSP
OPHTHALMIC | Status: DC | PRN
  Administered 2022-01-05: 2 [drp] via OPHTHALMIC

## 2022-01-05 MED ORDER — SODIUM CHLORIDE 0.9% FLUSH
INTRAVENOUS | Status: DC | PRN
  Administered 2022-01-05: 3 mL via INTRAVENOUS

## 2022-01-05 SURGICAL SUPPLY — 17 items
CATARACT SUITE SIGHTPATH (MISCELLANEOUS) ×1 IMPLANT
CLOTH BEACON ORANGE TIMEOUT ST (SAFETY) ×1 IMPLANT
EYE SHIELD UNIVERSAL CLEAR (GAUZE/BANDAGES/DRESSINGS) IMPLANT
FEE CATARACT SUITE SIGHTPATH (MISCELLANEOUS) ×1 IMPLANT
GLOVE BIOGEL PI IND STRL 6.5 (GLOVE) IMPLANT
GLOVE BIOGEL PI IND STRL 7.0 (GLOVE) ×2 IMPLANT
GLOVE BIOGEL PI INDICATOR 6.5 (GLOVE) ×1
GLOVE BIOGEL PI INDICATOR 7.0 (GLOVE) ×1
GLOVE ECLIPSE 6.0 STRL STRAW (GLOVE) IMPLANT
LENS IOL TECNIS SYNERGY 21.0 IMPLANT
NDL HYPO 18GX1.5 BLUNT FILL (NEEDLE) IMPLANT
NEEDLE HYPO 18GX1.5 BLUNT FILL (NEEDLE) ×1 IMPLANT
PAD ARMBOARD 7.5X6 YLW CONV (MISCELLANEOUS) ×1 IMPLANT
SYR TB 1ML LL NO SAFETY (SYRINGE) ×1 IMPLANT
TAPE SURG TRANSPORE 1 IN (GAUZE/BANDAGES/DRESSINGS) IMPLANT
TAPE SURGICAL TRANSPORE 1 IN (GAUZE/BANDAGES/DRESSINGS) ×1
WATER STERILE IRR 250ML POUR (IV SOLUTION) ×1 IMPLANT

## 2022-01-05 NOTE — Anesthesia Preprocedure Evaluation (Addendum)
Anesthesia Evaluation  Patient identified by MRN, date of birth, ID band Patient awake    Reviewed: Allergy & Precautions, H&P , NPO status , Patient's Chart, lab work & pertinent test results, reviewed documented beta blocker date and time   History of Anesthesia Complications (+) PONV and history of anesthetic complications  Airway Mallampati: II  TM Distance: >3 FB Neck ROM: full    Dental no notable dental hx.    Pulmonary neg pulmonary ROS, former smoker,    Pulmonary exam normal breath sounds clear to auscultation       Cardiovascular Exercise Tolerance: Good + dysrhythmias Atrial Fibrillation  Rhythm:irregular Rate:Normal     Neuro/Psych  Neuromuscular disease negative psych ROS   GI/Hepatic Neg liver ROS, GERD  Medicated,  Endo/Other  negative endocrine ROS  Renal/GU negative Renal ROS  negative genitourinary   Musculoskeletal   Abdominal   Peds  Hematology negative hematology ROS (+)   Anesthesia Other Findings   Reproductive/Obstetrics negative OB ROS                            Anesthesia Physical Anesthesia Plan  ASA: 3  Anesthesia Plan: MAC   Post-op Pain Management:    Induction:   PONV Risk Score and Plan:   Airway Management Planned:   Additional Equipment:   Intra-op Plan:   Post-operative Plan:   Informed Consent: I have reviewed the patients History and Physical, chart, labs and discussed the procedure including the risks, benefits and alternatives for the proposed anesthesia with the patient or authorized representative who has indicated his/her understanding and acceptance.     Dental Advisory Given  Plan Discussed with: CRNA  Anesthesia Plan Comments:        Anesthesia Quick Evaluation

## 2022-01-05 NOTE — Op Note (Signed)
Date of procedure: 01/05/22  Pre-operative diagnosis: Visually significant age-related combined cataract, Left Eye (H25.812)  Post-operative diagnosis: Visually significant age-related combined cataract, Left Eye (H25.812)  Procedure: Removal of cataract via phacoemulsification and insertion of intra-ocular lens Wynetta Emery and Johnson DFR00V +21.0D into the capsular bag of the Left Eye  Attending surgeon: Gerda Diss. Keira Bohlin, MD, MA  Anesthesia: MAC, Topical Akten  Complications: None  Estimated Blood Loss: <69m (minimal)  Specimens: None  Implants: As above  Indications:  Visually significant age-related cataract, Left Eye  Procedure:  The patient was seen and identified in the pre-operative area. The operative eye was identified and dilated.  The operative eye was marked.  Topical anesthesia was administered to the operative eye.     The patient was then to the operative suite and placed in the supine position.  A timeout was performed confirming the patient, procedure to be performed, and all other relevant information.   The patient's face was prepped and draped in the usual fashion for intra-ocular surgery.  A lid speculum was placed into the operative eye and the surgical microscope moved into place and focused.  An inferotemporal paracentesis was created using a 20 gauge paracentesis blade.  Shugarcaine was injected into the anterior chamber.  Viscoelastic was injected into the anterior chamber.  A temporal clear-corneal main wound incision was created using a 2.412mmicrokeratome.  A continuous curvilinear capsulorrhexis was initiated using an irrigating cystitome and completed using capsulorrhexis forceps.  Hydrodissection and hydrodeliniation were performed.  Viscoelastic was injected into the anterior chamber.  A phacoemulsification handpiece and a chopper as a second instrument were used to remove the nucleus and epinucleus. The irrigation/aspiration handpiece was used to remove any  remaining cortical material.   The capsular bag was reinflated with viscoelastic, checked, and found to be intact.  The intraocular lens was inserted into the capsular bag.  The irrigation/aspiration handpiece was used to remove any remaining viscoelastic.  The clear corneal wound and paracentesis wounds were then hydrated and checked with Weck-Cels to be watertight.  Maxitrol was instilled in the eye. The lid-speculum was removed.  The drape was removed.  The patient's face was cleaned with a wet and dry 4x4.    A clear shield was taped over the eye. The patient was taken to the post-operative care unit in good condition, having tolerated the procedure well.  Post-Op Instructions: [facility] and will receive all other orders and instructions.

## 2022-01-05 NOTE — Discharge Instructions (Addendum)
Please discharge patient when stable, will follow up today with Dr. Wrzosek at the [facility] [place] office immediately following discharge.  Leave shield in place until visit.  All paperwork with discharge instructions will be given at the office.  [facility] Address:  [address]  [place], [place] [address]  

## 2022-01-05 NOTE — Interval H&P Note (Signed)
History and Physical Interval Note:  01/05/2022 1:47 PM  [name]  has presented today for surgery, with the diagnosis of combined forms age related cataract; left.  The various methods of treatment have been discussed with the patient and family. After consideration of risks, benefits and other options for treatment, the patient has consented to  Procedure(s) with comments: CATARACT EXTRACTION PHACO AND INTRAOCULAR LENS PLACEMENT (IOC) (Left) - LEFT as a surgical intervention.  The patient's history has been reviewed, patient examined, no change in status, stable for surgery.  I have reviewed the patient's chart and labs.  Questions were answered to the patient's satisfaction.     Baruch Goldmann

## 2022-01-05 NOTE — Transfer of Care (Signed)
Immediate Anesthesia Transfer of Care Note  Patient: [name]  Procedure(s) Performed: CATARACT EXTRACTION PHACO AND INTRAOCULAR LENS PLACEMENT (IOC) (Left: Eye)  Patient Location: Short Stay  Anesthesia Type:MAC  Level of Consciousness: awake, alert  and oriented  Airway & Oxygen Therapy: Patient Spontanous Breathing  Post-op Assessment: Report given to RN and Post -op Vital signs reviewed and stable  Post vital signs: Reviewed and stable  Last Vitals:  Vitals Value Taken Time  BP 156/70   Temp 36   Pulse 60   Resp 16   SpO2 96     Last Pain:  Vitals:   01/05/22 1232  TempSrc: Oral  PainSc: 5       Patients Stated Pain Goal: 9 (38/87/19 5974)  Complications: No notable events documented.

## 2022-01-07 NOTE — Anesthesia Postprocedure Evaluation (Signed)
Anesthesia Post Note  Patient: [name]  Procedure(s) Performed: CATARACT EXTRACTION PHACO AND INTRAOCULAR LENS PLACEMENT (IOC) (Left: Eye)  Patient location during evaluation: Phase II Anesthesia Type: General Level of consciousness: awake Pain management: pain level controlled Vital Signs Assessment: post-procedure vital signs reviewed and stable Respiratory status: spontaneous breathing and respiratory function stable Cardiovascular status: blood pressure returned to baseline and stable Postop Assessment: no headache and no apparent nausea or vomiting Anesthetic complications: no Comments: Late entry   No notable events documented.   Last Vitals:  Vitals:   01/05/22 1232 01/05/22 1418  BP: (!) 148/57 (!) 142/43  Pulse: 62 60  Resp: 12 (!) 9  Temp: 36.4 C 36.6 C  SpO2: 100% 100%    Last Pain:  Vitals:   01/05/22 1418  TempSrc: Oral  PainSc: 0-No pain                 Louann Sjogren

## 2022-01-08 ENCOUNTER — Telehealth: Payer: Self-pay | Admitting: *Deleted

## 2022-01-08 ENCOUNTER — Ambulatory Visit (INDEPENDENT_AMBULATORY_CARE_PROVIDER_SITE_OTHER): Payer: Medicare Other | Admitting: Family Medicine

## 2022-01-08 ENCOUNTER — Encounter (HOSPITAL_COMMUNITY): Payer: Self-pay | Admitting: Ophthalmology

## 2022-01-08 VITALS — BP 128/74 | Wt 104.0 lb

## 2022-01-08 DIAGNOSIS — M8580 Other specified disorders of bone density and structure, unspecified site: Secondary | ICD-10-CM | POA: Diagnosis not present

## 2022-01-08 DIAGNOSIS — K219 Gastro-esophageal reflux disease without esophagitis: Secondary | ICD-10-CM

## 2022-01-08 DIAGNOSIS — Z85038 Personal history of other malignant neoplasm of large intestine: Secondary | ICD-10-CM

## 2022-01-08 DIAGNOSIS — R195 Other fecal abnormalities: Secondary | ICD-10-CM | POA: Diagnosis not present

## 2022-01-08 NOTE — Telephone Encounter (Signed)
Patient is in reminder file for bone density test in September- When I tried to schedule radiology states patient had one in 12/2020 and insurance will only cover every 2 years -please advise

## 2022-01-08 NOTE — Progress Notes (Signed)
   Subjective:    Patient ID: [name], female    DOB: Aug 27, 1940, 81 y.o.   MRN: [id number]  HPI Pt arrives for follow up. Pt states she is doing well. Blood pressure has been doing well.  Hematest positive stools  Osteopenia, unspecified location  History of colon cancer  Gastroesophageal reflux disease without esophagitis  Pt recently did a stool test that was positive.  Patient had colon cancer years ago has not had any recurrence She is being treated with omeprazole for reflux but the symptoms are under control Recently her hemoglobin dropped and so therefore stool test for blood was done IFOBT test was positive for blood  Review of Systems     Objective:   Physical Exam  General-in no acute distress Eyes-no discharge Lungs-respiratory rate normal, CTA CV-no murmurs,RRR Extremities skin warm dry no edema Neuro grossly normal Behavior normal, alert       Assessment & Plan:  1. Hematest positive stools Stool test was positive for blood she had a negative colonoscopy earlier this year she does have a history of colon cancer it is quite possible this could be from gastric area she denies any abdominal pain and has not seen any blood we will consult with gastroenterology  2. Osteopenia, unspecified location She is on oral biphosphonate's we will touch base with gastroenterology they may want [place] to stop this medicine weeks for now until causes figured out  3. History of colon cancer I doubt that this is related to this history  4. Gastroesophageal reflux disease without esophagitis Continue omeprazole she denies abdominal symptoms or esophagus symptoms currently no dysphagia no weight loss

## 2022-01-08 NOTE — Telephone Encounter (Signed)
See other phone message- duplicate 1/77/11

## 2022-01-08 NOTE — Telephone Encounter (Signed)
Thank you-we will wait till next year before doing bone density

## 2022-01-09 IMAGING — MR MR LUMBAR SPINE W/O CM
7 of 10 series · 29 of 48 positions shown · non-contrast
Comparison: Lumbar radiographs 04/24/2013. Lumbar MRI 04/27/2013.
PET-CT 06/27/2018.

CLINICAL DATA: 79-year-old female with severe posterior neck pain
radiating to both shoulders, back pain. Onset 2 days ago. History of
colon cancer.

EXAM:
MRI CERVICAL AND LUMBAR SPINE WITHOUT CONTRAST
TECHNIQUE: Multiplanar and multiecho pulse sequences of the cervical spine, to
include the craniocervical junction and cervicothoracic junction,
and lumbar spine, were obtained without intravenous contrast.

[Series 5: t2_tse_sag_fast · sagittal · 3.0mm · 0.43mm/px · 2 of 15 slices shown]
[im 1/15]
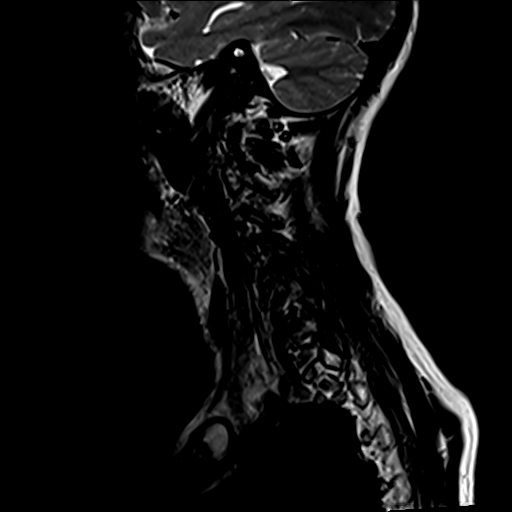
[im 15/15]
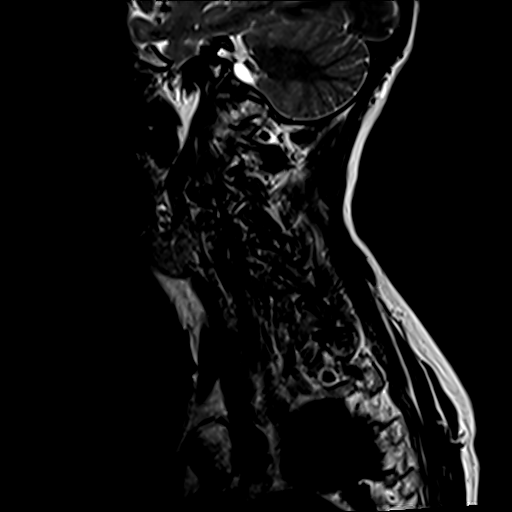

[Series 7: STIR · sagittal · 3.0mm · 0.86mm/px · 3 of 15 slices shown (1 of 2)]
[im 1/15]
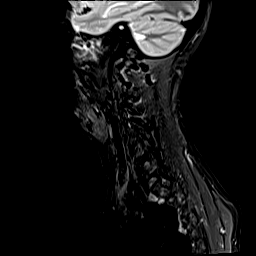
[im 8/15]
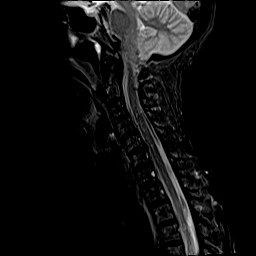
[im 15/15]
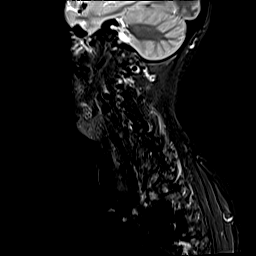

[Series 14: T2 · sagittal · 4.0mm · 0.81mm/px · 4 of 16 slices shown (1 of 2)]
[im 1/16]
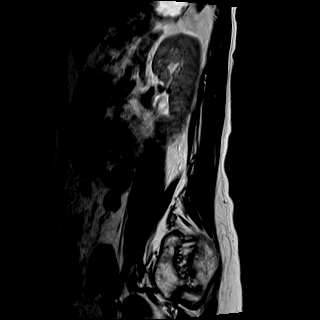
[im 6/16]
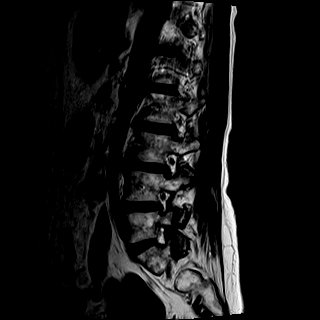
[im 11/16]
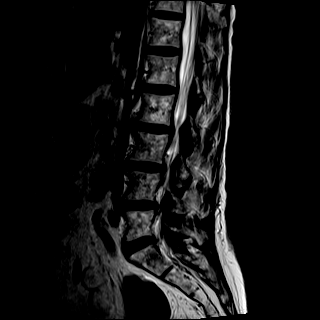
[im 16/16]
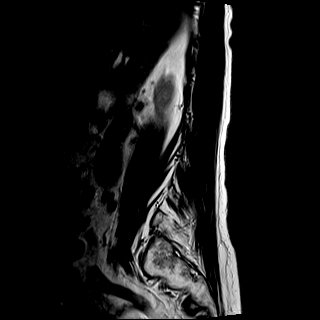

[Series 15: STIR · sagittal · 4.0mm · 0.51mm/px · 4 of 16 slices shown (2 of 2)]
[im 1/16]
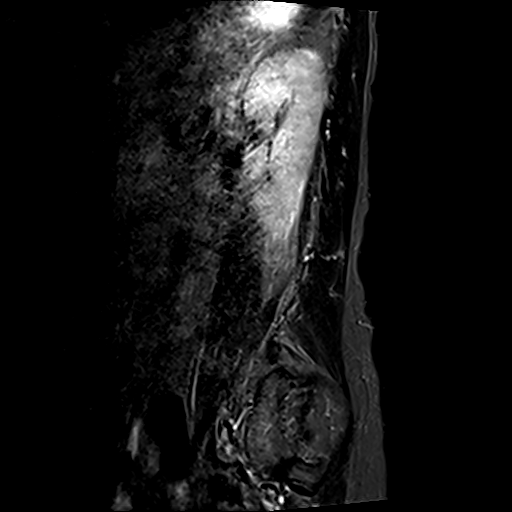
[im 6/16]
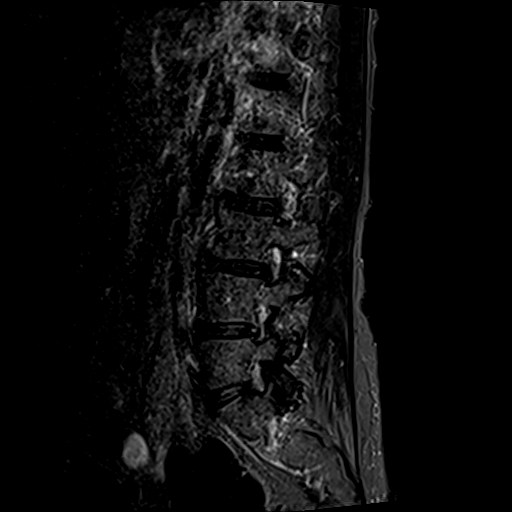
[im 11/16]
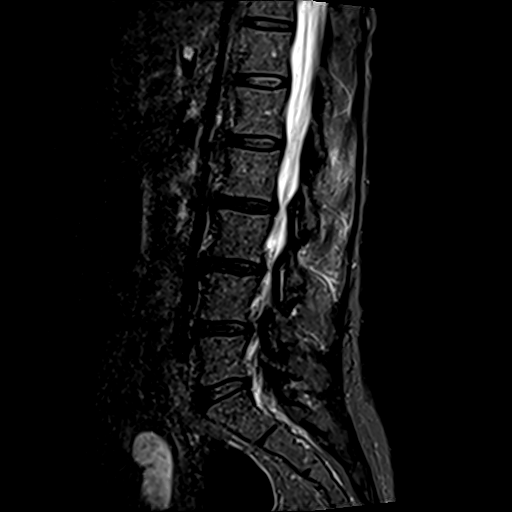
[im 16/16]
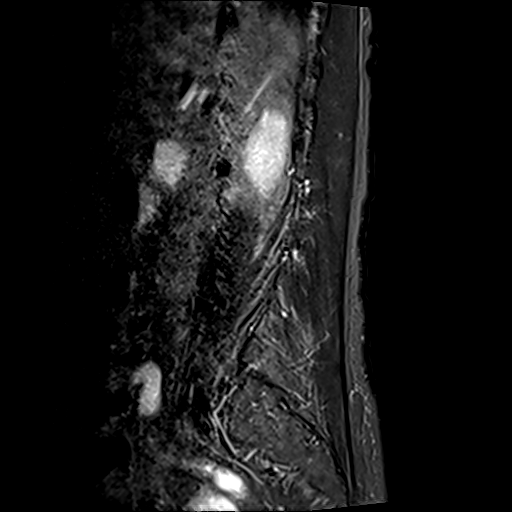

[Series 16: T1 · sagittal · 4.0mm · 1.02mm/px · 4 of 16 slices shown (1 of 2)]
[im 1/16]
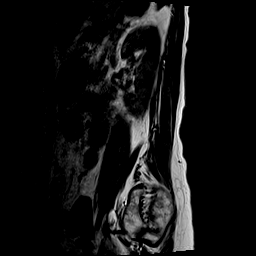
[im 6/16]
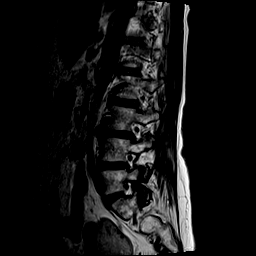
[im 11/16]
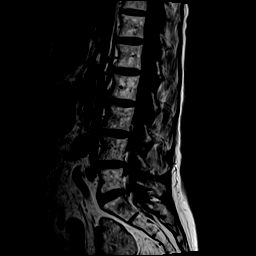
[im 16/16]
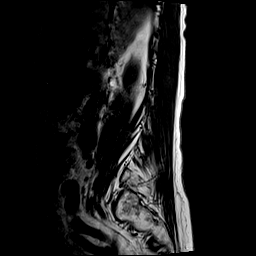

[Series 17: T2 · axial · 4.0mm · 0.70mm/px · z∈[-479,-325]mm · 6 of 28 slices shown (2 of 2)]
[im 1/28]
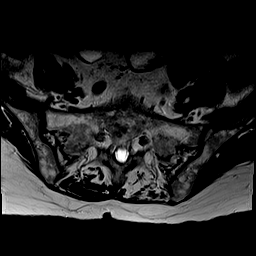
[im 6/28]
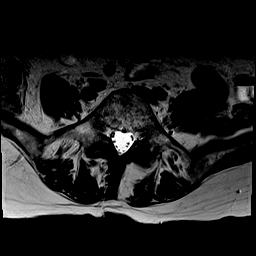
[im 11/28]
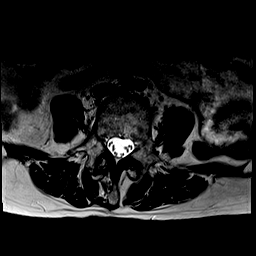
[im 17/28]
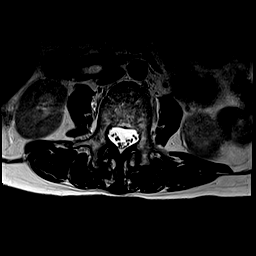
[im 22/28]
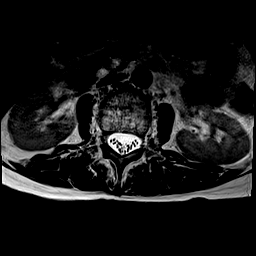
[im 28/28]
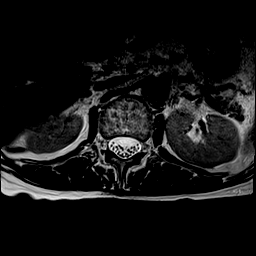

[Series 18: T1 · axial · 4.0mm · 0.35mm/px · z∈[-479,-325]mm · 6 of 28 slices shown (2 of 2)]
[im 1/28]
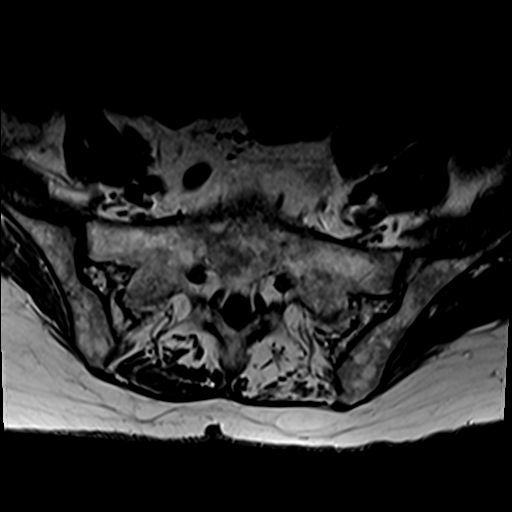
[im 6/28]
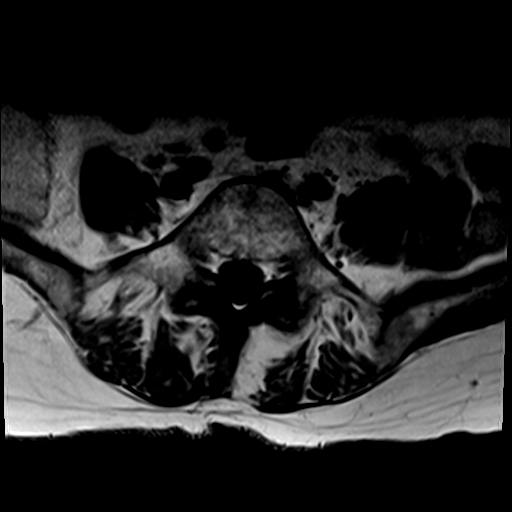
[im 11/28]
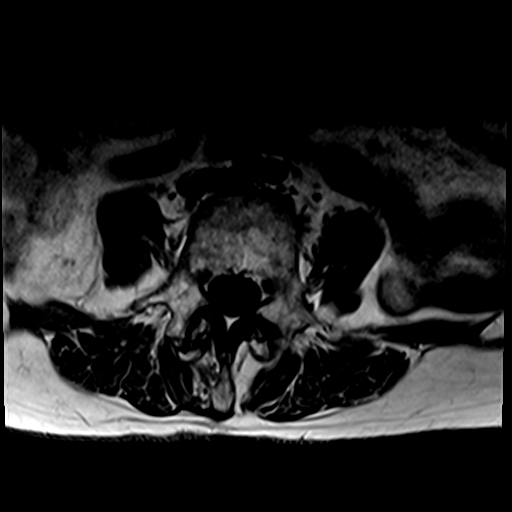
[im 17/28]
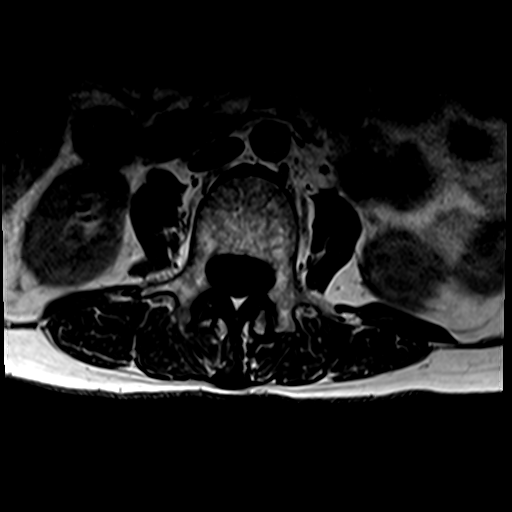
[im 22/28]
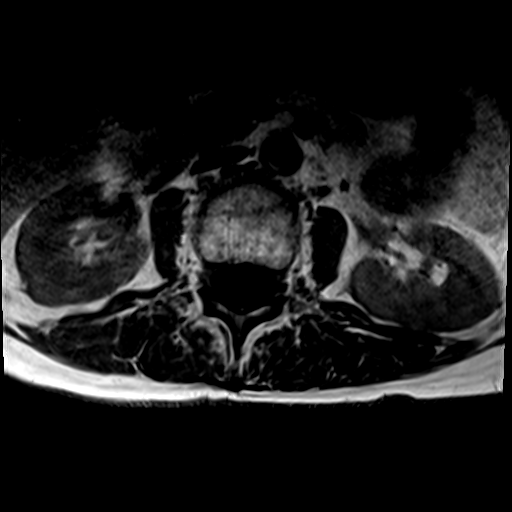
[im 28/28]
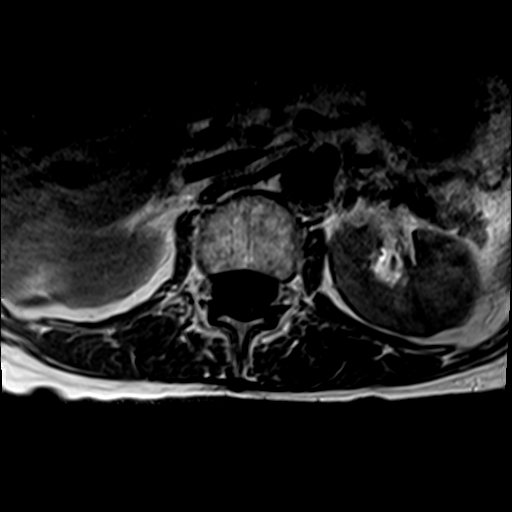

[29 of 48 positions shown; findings below may reference images not displayed]

FINDINGS: MRI CERVICAL SPINE FINDINGS

Alignment: Mild straightening and reversal of cervical lordosis.
Subtle degenerative appearing anterolisthesis of C4 on C5 (2 mm).

Vertebrae: No marrow edema or evidence of acute osseous abnormality.
Visualized bone marrow signal is within normal limits.

Cord: Appears normal. Capacious cervical spinal canal at most
levels.

Posterior Fossa, vertebral arteries, paraspinal tissues:
Cervicomedullary junction is within normal limits. Negative visible
posterior fossa. Preserved major vascular flow voids in the neck.
Codominant vertebral arteries.

Bilateral neck soft tissues appear within normal limits. Negative
visible lung apices.

Disc levels:
C2-C3:  Mild facet hypertrophy on the right.  No stenosis.
C3-C4:  Mild to moderate bilateral facet hypertrophy.  No stenosis.
C4-C5: Mild to moderate bilateral facet hypertrophy. Minimal disc
bulging. No convincing stenosis.
C5-C6: Mild circumferential disc bulge with broad-based posterior
component. Moderate left facet hypertrophy. Effaced ventral CSF
space but no significant spinal stenosis. No foraminal stenosis.
C6-C7: Mild to moderate facet hypertrophy on the left. No stenosis.
C7-T1: Mild to moderate facet hypertrophy greater on the left. No
stenosis.

Negative for age visible upper thoracic levels.

MRI LUMBAR SPINE FINDINGS

Segmentation:  Normal on the 5577 radiographs.

Alignment: Straightening of lumbar lordosis is stable since 5577. No
significant spondylolisthesis.

Vertebrae: No marrow edema or evidence of acute osseous abnormality.
Visualized bone marrow signal is within normal limits. Intact
visible sacrum and SI joints.

Conus medullaris and cauda equina: Conus extends to the T12 level.
No lower spinal cord or conus signal abnormality. Capacious lower
thoracic and lumbar spinal canal. Cauda equina nerve roots appear
normal.

Paraspinal and other soft tissues: Negative.

Disc levels:

Mild for age lumbar spine degeneration as seen in 5577. Notable
levels include
L2-L3: Circumferential disc bulge. Mild posterior element
hypertrophy. No significant stenosis.
L3-L4: Left eccentric circumferential disc bulge. Mild posterior
element hypertrophy. Borderline to mild left lateral recess and left
foraminal stenosis.
L4-L5: Mild circumferential disc bulge. Mild to moderate facet and
ligament flavum hypertrophy. No significant stenosis.
IMPRESSION: 1. No acute or metastatic process identified on noncontrast cervical
and lumbar MRI.

2. Cervical spine degeneration is fairly concordant with age, and
the dominant cervical degenerative finding is facet arthropathy. No
significant cervical disc herniation, spinal stenosis, or convincing
cervical spine neural impingement.

3. Similarly, mild for age lumbar spine degeneration only mildly
progressed since a 5577 MRI. No lumbar spinal stenosis. Up to mild
left L3 and L4 nerve level stenosis.

## 2022-01-09 NOTE — Addendum Note (Signed)
Addended by: Vicente Males on: 01/09/2022 11:04 AM   Modules accepted: Orders

## 2022-01-09 NOTE — Progress Notes (Signed)
Referral placed in Epic.

## 2022-01-16 ENCOUNTER — Other Ambulatory Visit: Payer: Self-pay | Admitting: Family Medicine

## 2022-01-18 ENCOUNTER — Other Ambulatory Visit: Payer: Self-pay | Admitting: Family Medicine

## 2022-01-18 MED ORDER — HYDROCODONE-ACETAMINOPHEN 10-325 MG PO TABS: ORAL_TABLET | ORAL | 0 refills | Status: DC

## 2022-01-18 MED ORDER — HYDROCODONE-ACETAMINOPHEN 10-325 MG PO TABS: 1.0000 | ORAL_TABLET | Freq: Three times a day (TID) | ORAL | 0 refills | Status: DC | PRN

## 2022-01-19 ENCOUNTER — Encounter (INDEPENDENT_AMBULATORY_CARE_PROVIDER_SITE_OTHER): Payer: Self-pay | Admitting: *Deleted

## 2022-01-29 ENCOUNTER — Encounter (INDEPENDENT_AMBULATORY_CARE_PROVIDER_SITE_OTHER): Payer: Self-pay | Admitting: Gastroenterology

## 2022-02-17 ENCOUNTER — Encounter: Payer: Self-pay | Admitting: Gastroenterology

## 2022-02-17 ENCOUNTER — Ambulatory Visit (INDEPENDENT_AMBULATORY_CARE_PROVIDER_SITE_OTHER): Payer: Medicare Other | Admitting: Gastroenterology

## 2022-02-17 VITALS — BP 126/68 | HR 57 | Temp 97.4°F | Ht 62.0 in | Wt 103.8 lb

## 2022-02-17 DIAGNOSIS — R195 Other fecal abnormalities: Secondary | ICD-10-CM | POA: Diagnosis not present

## 2022-02-17 DIAGNOSIS — Z86010 Personal history of colonic polyps: Secondary | ICD-10-CM

## 2022-02-17 DIAGNOSIS — K5900 Constipation, unspecified: Secondary | ICD-10-CM

## 2022-02-17 DIAGNOSIS — D649 Anemia, unspecified: Secondary | ICD-10-CM

## 2022-02-17 NOTE — Progress Notes (Addendum)
GI Office Note    Referring Provider: Kathyrn Drown, MD Primary Care Physician:  Kathyrn Drown, MD Primary Gastroenterologist: Harvel Quale, MD, previously Dr. Laural Golden  Date:  02/17/2022  ID:  [name], DOB Sep 02, 1940, MRN [id number]   Chief Complaint   Chief Complaint  Patient presents with   Blood In Stools   History of Present Illness  [name] is a 81 y.o. female with a history of colon cancer s/p resection in 2006, GERD, gastroparesis, HLD, A-fib, and tobacco use presenting today with recent heme positive stool.  Last office visit 12/07/19. Reported regular bowel on nightly lactulose and had recently been started on oral iron.  Also taking omeprazole 20 mg daily with control of GERD.  Has chronic nausea but has been unchanged.  Denied any fatigue or shortness of breath.  She had recent colonoscopy and EGD in 2020 with gastric polyps, and tubular adenomas and advised surveillance in 3 years.  CBC and iron panel checked.  She was scheduled for capsule endoscopy.  Capsule endoscopy 12/26/2019 was incomplete as it did not reach the cecum at 8 hours.  She did have 2 small jejunal diverticula without bleeding and 2 areas of focal ileitis.  CTE was ordered but not completed.  Last colonoscopy 09/10/21: three 4 to 6 mm polyps in the sigmoid and transverse colon.  Patent end-to-side colocolonic anastomosis with healthy-appearing mucosa. Biopsy revealing tubular adenomas. Advised repeat colonoscopy in 3 years for surveillance as cecum not well-prepped.  Seen by PCP 01/08/22.  Patient reported positive heme test that was performed due to drop in her hemoglobin from 11.4-10.5.  FOBT was + 01/05/2022.  She denies any melena or BRBPR as well as any abdominal pain.  GERD well controlled on omeprazole 20 mg daily.  Today: Taking iron everyday. Denies melena or BRBPR. Has been on Eliquis for many years. Has a lot of bruising. Denies lack of appetite, early satiety,  nausea/vomiting. Energy has been poor but reports she has been taking care of her husband who has dementia and prostate cancer and kidney issues and is incontinent. Does not have help at home, has to change linen daily. She reports a Marine scientist and nurse aide was supposed to be coming but no one has showed up yet. Therapy has been coming to their home to help her some. Has been vry exhausted from caring for him. Gets about 3-4 hours sleep per night. Reports she fell in January and fractured her shoulder, none recently. Denies chest pain, shortness of breath, restless leg, PICA. No upper GI complaints. No weight loss.   Is on midodrine for her BP. Denies any syncope, lightheadedness. This occurs very rarely.   Still taking lactulose. BMs regularly.    Current Outpatient Medications  Medication Sig Dispense Refill   alendronate (FOSAMAX) 70 MG tablet Take 1 tablet (70 mg total) by mouth every 7 (seven) days. Take with a full glass of water on an empty stomach. (Patient taking differently: Take 70 mg by mouth every Thursday. Take with a full glass of water on an empty stomach.) 4 tablet 11   apixaban (ELIQUIS) 5 MG TABS tablet Take 1 tablet (5 mg total) by mouth 2 (two) times daily. 180 tablet 3   Calcium Carbonate (CALCIUM 600 PO) Take 600 mg by mouth daily.     ferrous sulfate 325 (65 FE) MG tablet Take 325 mg by mouth daily with breakfast.     flecainide (TAMBOCOR) 100 MG tablet Take 1 tablet (  100 mg total) by mouth every 12 (twelve) hours. 180 tablet 3   HYDROcodone-acetaminophen (NORCO) 10-325 MG tablet TAKE ONE TABLET BY MOUTH THREE TIMES A DAY AS NEEDED FOR PAIN 90 tablet 0   HYDROcodone-acetaminophen (NORCO) 10-325 MG tablet Take 1 tablet by mouth 3 (three) times daily as needed. 90 tablet 0   HYDROcodone-acetaminophen (NORCO) 10-325 MG tablet 1 taken 3 times daily as needed for chronic pain 90 tablet 0   lactulose (CHRONULAC) 10 GM/15ML solution Take 20 g by mouth at bedtime.     midodrine  (PROAMATINE) 5 MG tablet Take 1 tablet (5 mg total) by mouth 3 (three) times daily with meals. 270 tablet 2   omeprazole (PRILOSEC) 20 MG capsule Take 20 mg by mouth daily before breakfast.      rosuvastatin (CRESTOR) 40 MG tablet Take 1 tablet (40 mg total) by mouth daily. 90 tablet 3   vitamin B-12 (CYANOCOBALAMIN) 1000 MCG tablet Take 1,000 mcg by mouth daily.     No current facility-administered medications for this visit.    Past Medical History:  Diagnosis Date   Adenocarcinoma, colon ([place]) 03/2005   Stage I; resected in 03/2005   Arthritis    Colon cancer [facility])    Removed surgicaly    Dysrhythmia    Gastroesophageal reflux disease    Gastroparesis    Hearing loss    mild   Hyperlipidemia    Irregular heartbeat    Ovarian cyst 2008   Paroxysmal atrial fibrillation ([facility])    Palpitations; maintained on flecainide; -normal coronary angiography and 1999; negative stress nuclear study in 11/2005   PONV (postoperative nausea and vomiting)    Tobacco abuse, in remission    10-pack-years; quit in 2001    Past Surgical History:  Procedure Laterality Date   [place]     BIOPSY  10/18/2017   Procedure: BIOPSY;  Surgeon: [name], MD;  [facility];  Service: Endoscopy;;  gastric   BIOPSY  07/06/2018   Procedure: BIOPSY;  Surgeon: [name], MD;  [facility];  Service: Endoscopy;;  gastric   CATARACT EXTRACTION W/PHACO Right 12/22/2021   Procedure: CATARACT EXTRACTION PHACO AND INTRAOCULAR LENS PLACEMENT ([place]);  Surgeon: Baruch Goldmann, MD;  Location: [facility];  Service: Ophthalmology;  Laterality: Right;  CDE: 13.86   CATARACT EXTRACTION W/PHACO Left 01/05/2022   Procedure: CATARACT EXTRACTION PHACO AND INTRAOCULAR LENS PLACEMENT ([facility]);  Surgeon: Baruch Goldmann, MD;  Location: [facility];  Service: Ophthalmology;  Laterality: Left;  CDE 21.39   CHOLECYSTECTOMY     COLON SURGERY  2006   Colon cancer   COLONOSCOPY   03/25/2012   [name], MD   COLONOSCOPY N/A 04/26/2017   Procedure: COLONOSCOPY;  Surgeon: [name], MD;  [facility];  Service: Endoscopy;  Laterality: N/A;  2:00   COLONOSCOPY N/A 07/06/2018   Procedure: COLONOSCOPY;  Surgeon: [name], MD;  [facility];  Service: Endoscopy;  Laterality: N/A;  10:30   COLONOSCOPY WITH PROPOFOL N/A 09/10/2021   Procedure: COLONOSCOPY WITH PROPOFOL;  Surgeon: [name], MD;  [facility];  Service: Endoscopy;  Laterality: N/A;  910   COSMETIC SURGERY  09/2007   ESOPHAGOGASTRODUODENOSCOPY N/A 10/18/2017   Procedure: ESOPHAGOGASTRODUODENOSCOPY (EGD);  Surgeon: [name], MD;  [facility];  Service: Endoscopy;  Laterality: N/A;   ESOPHAGOGASTRODUODENOSCOPY N/A 07/06/2018   Procedure: ESOPHAGOGASTRODUODENOSCOPY (EGD);  Surgeon: [name], MD;  [facility];  Service: Endoscopy;  Laterality: N/A;   GIVENS CAPSULE STUDY N/A 12/26/2019   Procedure: GIVENS CAPSULE STUDY;  Surgeon: [name], MD;  [facility];  Service: Endoscopy;  Laterality: N/A;  730   PARTIAL COLECTOMY  2006   adenocarcinoma   POLYPECTOMY  04/26/2017   Procedure: POLYPECTOMY;  Surgeon: [name], MD;  [facility];  Service: Endoscopy;;  colon   POLYPECTOMY  07/06/2018   Procedure: POLYPECTOMY;  Surgeon: [name], MD;  [facility];  Service: Endoscopy;;  colon    POLYPECTOMY  09/10/2021   Procedure: POLYPECTOMY;  Surgeon: [name], MD;  [facility];  Service: Endoscopy;;  transverse x2;sigmoid   TONSILLECTOMY      Family History  Problem Relation Age of Onset   Heart disease Mother    Stroke Mother    Heart attack Mother    Cancer Father        lung   Heart disease Father    Hypertension Father    Cancer Brother        rectal   Healthy Son    Heart failure Sister    Stroke Sister     Allergies as of 02/17/2022    (No Known Allergies)    Social History   Socioeconomic History   Marital status: Married    Spouse name: Not on file   Number of children: Not on file   Years of education: Not on file   Highest education level: Not on file  Occupational History   Occupation: Radiation protection practitioner  Tobacco Use   Smoking status: Former    Packs/day: 0.30    Years: 40.00    Total pack years: 12.00    Types: Cigarettes    Start date: 02/23/1963    Quit date: 03/25/2002    Years since quitting: 19.9   Smokeless tobacco: Never   Tobacco comments:    Quit smoking in 2001  Vaping Use   Vaping Use: Never used  Substance and Sexual Activity   Alcohol use: No    Alcohol/week: 0.0 standard drinks of alcohol   Drug use: No   Sexual activity: Yes    Birth control/protection: Surgical  Other Topics Concern   Not on file  Social History Narrative   Lives with husband in a one story home.  Has 3 children.  Retired from Medical sales representative work.  Education: some college.   Social Determinants of Health   Financial Resource Strain: Low Risk  (09/09/2021)   Overall Financial Resource Strain (CARDIA)    Difficulty of Paying Living Expenses: Not hard at all  Food Insecurity: No Food Insecurity (09/09/2021)   Hunger Vital Sign    Worried About Running Out of Food in the Last Year: Never true    Ran Out of Food in the Last Year: Never true  Transportation Needs: No Transportation Needs (09/09/2021)   PRAPARE - Hydrologist (Medical): No    Lack of Transportation (Non-Medical): No  Physical Activity: Sufficiently Active (09/09/2021)   Exercise Vital Sign    Days of Exercise per Week: 5 days    Minutes of Exercise per Session: 30 min  Stress: No Stress Concern Present (09/09/2021)   [place]    Feeling of Stress : Only a little  Social Connections: Socially Integrated (09/09/2021)   Social Connection  and Isolation Panel [NHANES]     Frequency of Communication with Friends and Family: More than three times a week    Frequency of Social Gatherings with Friends and Family: More than three times a week    Attends Religious Services: More than 4 times per year    Active Member of [employer] or Organizations: Yes    Attends Music therapist: More than 4 times per year    Marital Status: Married     Review of Systems   Gen: + Fatigue.  Denies fever, chills, anorexia. Denies  weakness, weight loss.  CV: Denies chest pain, palpitations, syncope, peripheral edema, and claudication. Resp: Denies dyspnea at rest, cough, wheezing, coughing up blood, and pleurisy. GI: See HPI Derm: Denies rash, itching, dry skin Psych: Denies depression, anxiety, memory loss, confusion. No homicidal or suicidal ideation.  Heme: Denies bruising, bleeding, and enlarged lymph nodes.   Physical Exam   BP 126/68 (BP Location: Left Arm, Patient Position: Sitting, Cuff Size: Small)   Pulse (!) 57   Temp (!) 97.4 F (36.3 C) (Oral)   Ht '5\' 2"'$  (1.575 m)   Wt 103 lb 12.8 oz (47.1 kg)   SpO2 98%   BMI 18.99 kg/m   General:  Alert and oriented. No distress noted. Pleasant and cooperative.  Head:  Normocephalic and atraumatic. Eyes:  Conjuctiva clear without scleral icterus. Mouth:  Oral mucosa pink and moist. Good dentition. No lesions. Lungs:  Clear to auscultation bilaterally. No wheezes, rales, or rhonchi. No distress.  Heart:  S1, S2 present without murmurs appreciated.  Abdomen:  +BS, soft, non-tender and non-distended. No rebound or guarding. No HSM or masses noted. Rectal: deferred Msk:  Symmetrical without gross deformities. Normal posture. Extremities:  Without edema. Neurologic:  Alert and  oriented x4 Psych:  Alert and cooperative. Normal mood and affect.   Assessment  [name] is a 81 y.o. female with a history of colon cancer s/p resection in 2006, GERD, gastroparesis, HLD, A-fib on Eliquis, and tobacco use  presenting today with heme positive stool.  Positive heme occult stool/IDA: Recent colonoscopy in May 2023 with 3 tubular adenomas and recommended surveillance in 3 years as there was incomplete view of the cecum.  Prior capsule study incomplete in 2021, noted two jejunal diverticula without bleeding.  Recent drop in hemoglobin from 11.4 in May to 10.5 in August with macrocytic indices.  Of note she is on chronic Eliquis for A-fib.  On oral ferrous sulfate 325 mg daily. Has denied any overt GI bleeding, melena or BRBPR.  On oral B12 supplementation and last checked was B12 level of 1299.  Denies any pica, shortness of breath, restless leg.  No recent syncope.  Does have some significant fatigue and exhaustion, however she has been the primary caregiver for her husband who is very ill and does not have much assistance at home and is normally only getting about 3-4 hours of sleep per night.  She denies any lack of appetite, early satiety, nausea/vomiting.  Given recent colonoscopy 5 months prior, heme positive stool not likely result of malignancy.  We will continue oral ferrous sulfate 325 mg daily and recheck H/H and iron panel.  Constipation: Controlled on lactulose 20 g daily.  PLAN   CBC, Iron panel. Continue oral ferrous sulfate 325 mg daily Continue lactulose 20 g daily.  Continue to monitor for any signs of overt GI bleeding. Follow up in 6 months.    Venetia Night, MSN, FNP-BC, AGACNP-BC  [facility]  I have reviewed the note and agree with the APP's assessment as described in this progress note  We will recheck iron panel at this point and hemoglobin.  If not improving or downtrending, we will need to consider repeating a capsule endoscopy.  As the previous capsule endoscopy had delayed passage, she will need to be given 1 dose of Reglan 10 mg PO in the morning of the procedure.  Maylon Peppers, MD Gastroenterology and Hepatology [facility]  Gastroenterology

## 2022-02-17 NOTE — Patient Instructions (Addendum)
I am rechecking your hemoglobin and your iron.  These labs were placed at [facility] may go today to have them drawn if you would like.  Depending on your results, we may need to consider referral to hematology.  Continue to monitor any ongoing black/tarry stools or bright red blood.  If you develop any bleeding or lightheadedness, dizziness, syncope, chest pain, or shortness of breath, you should head to the ED for further evaluation.  Continue taking your oral iron 325 mg daily and your lactulose 20 g daily.  We will have you follow-up in about 6 months or sooner if needed.  It was a pleasure to see you today. I want to create trusting relationships with patients. If you receive a survey regarding your visit,  I greatly appreciate you taking time to fill this out on paper or through your MyChart. I value your feedback.  Venetia Night, MSN, FNP-BC, AGACNP-BC [facility]

## 2022-02-18 LAB — CBC
Hematocrit: 32.3 % — ABNORMAL LOW (ref 34.0–46.6)
Hemoglobin: 10.6 g/dL — ABNORMAL LOW (ref 11.1–15.9)
MCH: 32.6 pg (ref 26.6–33.0)
MCHC: 32.8 g/dL (ref 31.5–35.7)
MCV: 99 fL — ABNORMAL HIGH (ref 79–97)
Platelets: 261 10*3/uL (ref 150–450)
RBC: 3.25 x10E6/uL — ABNORMAL LOW (ref 3.77–5.28)
RDW: 11.2 % — ABNORMAL LOW (ref 11.7–15.4)
WBC: 7.4 10*3/uL (ref 3.4–10.8)

## 2022-02-18 LAB — IRON,TIBC AND FERRITIN PANEL
Ferritin: 189 ng/mL — ABNORMAL HIGH (ref 15–150)
Iron Saturation: 43 % (ref 15–55)
Iron: 144 ug/dL — ABNORMAL HIGH (ref 27–139)
Total Iron Binding Capacity: 337 ug/dL (ref 250–450)
UIBC: 193 ug/dL (ref 118–369)

## 2022-02-19 ENCOUNTER — Other Ambulatory Visit: Payer: Self-pay | Admitting: *Deleted

## 2022-02-19 ENCOUNTER — Ambulatory Visit (INDEPENDENT_AMBULATORY_CARE_PROVIDER_SITE_OTHER): Payer: Medicare Other | Admitting: Gastroenterology

## 2022-02-19 DIAGNOSIS — D649 Anemia, unspecified: Secondary | ICD-10-CM

## 2022-02-19 DIAGNOSIS — D509 Iron deficiency anemia, unspecified: Secondary | ICD-10-CM

## 2022-03-06 ENCOUNTER — Other Ambulatory Visit: Payer: Self-pay | Admitting: Family Medicine

## 2022-03-19 ENCOUNTER — Other Ambulatory Visit: Payer: Self-pay | Admitting: Student

## 2022-04-09 ENCOUNTER — Ambulatory Visit (INDEPENDENT_AMBULATORY_CARE_PROVIDER_SITE_OTHER): Payer: Medicare Other | Admitting: Family Medicine

## 2022-04-09 ENCOUNTER — Telehealth: Payer: Self-pay

## 2022-04-09 VITALS — BP 140/70 | HR 61 | Temp 97.5°F | Ht 62.0 in | Wt 103.0 lb

## 2022-04-09 DIAGNOSIS — Z5181 Encounter for therapeutic drug level monitoring: Secondary | ICD-10-CM | POA: Diagnosis not present

## 2022-04-09 DIAGNOSIS — R0989 Other specified symptoms and signs involving the circulatory and respiratory systems: Secondary | ICD-10-CM

## 2022-04-09 DIAGNOSIS — R7303 Prediabetes: Secondary | ICD-10-CM

## 2022-04-09 DIAGNOSIS — Z79891 Long term (current) use of opiate analgesic: Secondary | ICD-10-CM | POA: Diagnosis not present

## 2022-04-09 DIAGNOSIS — E7849 Other hyperlipidemia: Secondary | ICD-10-CM | POA: Diagnosis not present

## 2022-04-09 DIAGNOSIS — G894 Chronic pain syndrome: Secondary | ICD-10-CM | POA: Diagnosis not present

## 2022-04-09 DIAGNOSIS — Z23 Encounter for immunization: Secondary | ICD-10-CM | POA: Diagnosis not present

## 2022-04-09 MED ORDER — HYDROCODONE-ACETAMINOPHEN 10-325 MG PO TABS: ORAL_TABLET | ORAL | 0 refills | Status: DC

## 2022-04-09 MED ORDER — HYDROCODONE-ACETAMINOPHEN 10-325 MG PO TABS: 1.0000 | ORAL_TABLET | Freq: Three times a day (TID) | ORAL | 0 refills | Status: DC | PRN

## 2022-04-09 NOTE — Progress Notes (Signed)
Subjective:    Patient ID: [name], female    DOB: 1941-05-10, 81 y.o.   MRN: [id number]  HPI  3 month follow up - refill pain medications Had visit from nurse from [employer] to do screenings tests  This patient was seen today for chronic pain  The medication list was reviewed and updated.  Location of Pain for which the patient has been treated with regarding narcotics: Mid and low back pain previous back surgery failed other medications stretching conservative measures she is not a surgical candidate  Onset of this pain: Present for years she has been on pain medicine for years   -Compliance with medication: Good compliance  - Number patient states they take daily: Maximum 3/day  -when was the last dose patient took?  Earlier today  The patient was advised the importance of maintaining medication and not using illegal substances with these.  Here for refills and follow up  The patient was educated that we can provide 3 monthly scripts for their medication, it is their responsibility to follow the instructions.  Side effects or complications from medications: Denies side effects  Patient is aware that pain medications are meant to minimize the severity of the pain to allow their pain levels to improve to allow for better function. They are aware of that pain medications cannot totally remove their pain.  Due for UDT ( at least once per year) : Next visit  Scale of 1 to 10 ( 1 is least 10 is most) Your pain level without the medicine: 8 Your pain level with medication 4  Scale 1 to 10 ( 1-helps very little, 10 helps very well) How well does your pain medication reduce your pain so you can function better through out the day? 8  Quality of the pain: Throbbing aching  Persistence of the pain: Present all the time  Modifying factors: Worse with activity  Other hyperlipidemia  Chronic pain syndrome  Encounter for monitoring opioid maintenance  therapy  Prediabetes  Immunization due - Plan: Pneumococcal conjugate vaccine 20-valent (Prevnar 20)  Decreased pedal pulses - Plan: US ARTERIAL ABI (SCREENING LOWER EXTREMITY)   Patient had evaluation by home insurance nurse and was told that she has decreased pedal pulses.  In addition to this she has underlying prediabetes plus also chronic pain plus also blood pressure slightly elevated  Review of Systems     Objective:   Physical Exam General-in no acute distress Eyes-no discharge Lungs-respiratory rate normal, CTA CV-no murmurs,RRR Extremities skin warm dry no edema Neuro grossly normal Behavior normal, alert  Blood sugar checked multiple times slight elevation      Assessment & Plan:  1. Other hyperlipidemia Continue cholesterol medicine.  No need to do blood work today  2. Chronic pain syndrome The patient was seen in followup for chronic pain. A review over at their current pain status was discussed. Drug registry was checked. Prescriptions were given.  Regular follow-up recommended. Discussion was held regarding the importance of compliance with medication as well as pain medication contract.  Patient was informed that medication may cause drowsiness and should not be combined  with other medications/alcohol or street drugs. If the patient feels medication is causing altered alertness then do not drive or operate dangerous equipment.  Should be noted that the patient appears to be meeting appropriate use of opioids and response.  Evidenced by improved function and decent pain control without significant side effects and no evidence of overt aberrancy issues.  Upon discussion with the patient today they understand that opioid therapy is optional and they feel that the pain has been refractory to reasonable conservative measures and is significant and affecting quality of life enough to warrant ongoing therapy and wishes to continue opioids.  Refills were  provided.   3. Encounter for monitoring opioid maintenance therapy Please see above Urine drug screen next visit  4. Prediabetes A1c 5.7 by insurance home nurse this is prediabetes we did discuss healthy diet she does not need to lose weight  5. Immunization due Pneumococcal - Pneumococcal conjugate vaccine 20-valent (Prevnar 20)  6. Decreased pedal pulses She does have diminished pulses in her feet we will check ABI screening await results - US ARTERIAL ABI (SCREENING LOWER EXTREMITY)  Blood pressure slightly elevated on recheck next week when she comes with her husband potentially at that point in time we will reduce her midodrine

## 2022-04-10 ENCOUNTER — Other Ambulatory Visit: Payer: Self-pay | Admitting: Student

## 2022-04-17 ENCOUNTER — Ambulatory Visit (HOSPITAL_COMMUNITY): Payer: Medicare Other

## 2022-04-20 ENCOUNTER — Ambulatory Visit (HOSPITAL_COMMUNITY)
Admission: RE | Admit: 2022-04-20 | Discharge: 2022-04-20 | Disposition: A | Discharge status: Home / Self Care | Payer: Medicare Other | Source: Ambulatory Visit | Attending: Family Medicine | Admitting: Family Medicine

## 2022-04-20 DIAGNOSIS — R0989 Other specified symptoms and signs involving the circulatory and respiratory systems: Secondary | ICD-10-CM | POA: Insufficient documentation

## 2022-05-06 ENCOUNTER — Other Ambulatory Visit: Payer: Self-pay | Admitting: *Deleted

## 2022-05-06 ENCOUNTER — Telehealth: Payer: Self-pay | Admitting: Cardiology

## 2022-05-06 DIAGNOSIS — D649 Anemia, unspecified: Secondary | ICD-10-CM

## 2022-05-06 DIAGNOSIS — D509 Iron deficiency anemia, unspecified: Secondary | ICD-10-CM

## 2022-05-06 MED ORDER — MIDODRINE HCL 5 MG PO TABS: 5.0000 mg | ORAL_TABLET | Freq: Three times a day (TID) | ORAL | 0 refills | Status: DC

## 2022-05-06 NOTE — Telephone Encounter (Signed)
Completed.

## 2022-05-06 NOTE — Telephone Encounter (Signed)
*  STAT* If patient is at the pharmacy, call can be transferred to refill team.   1. Which medications need to be refilled? (please list name of each medication and dose if known)  midodrine (PROAMATINE) 5 MG tablet  2. Which pharmacy/location (including street and city if local pharmacy) is medication to be sent to? [place]  3. Do they need a 30 day or 90 day supply?  90 day supply

## 2022-05-18 DIAGNOSIS — D649 Anemia, unspecified: Secondary | ICD-10-CM | POA: Diagnosis not present

## 2022-05-18 DIAGNOSIS — D509 Iron deficiency anemia, unspecified: Secondary | ICD-10-CM | POA: Diagnosis not present

## 2022-05-19 LAB — CBC WITH DIFFERENTIAL/PLATELET
Basophils Absolute: 0 10*3/uL (ref 0.0–0.2)
Basos: 0 %
EOS (ABSOLUTE): 0.1 10*3/uL (ref 0.0–0.4)
Eos: 1 %
Hematocrit: 31.7 % — ABNORMAL LOW (ref 34.0–46.6)
Hemoglobin: 10.6 g/dL — ABNORMAL LOW (ref 11.1–15.9)
Immature Grans (Abs): 0 10*3/uL (ref 0.0–0.1)
Immature Granulocytes: 0 %
Lymphocytes Absolute: 2.1 10*3/uL (ref 0.7–3.1)
Lymphs: 28 %
MCH: 33.7 pg — ABNORMAL HIGH (ref 26.6–33.0)
MCHC: 33.4 g/dL (ref 31.5–35.7)
MCV: 101 fL — ABNORMAL HIGH (ref 79–97)
Monocytes Absolute: 0.4 10*3/uL (ref 0.1–0.9)
Monocytes: 6 %
Neutrophils Absolute: 4.9 10*3/uL (ref 1.4–7.0)
Neutrophils: 65 %
Platelets: 293 10*3/uL (ref 150–450)
RBC: 3.15 x10E6/uL — ABNORMAL LOW (ref 3.77–5.28)
RDW: 11.4 % — ABNORMAL LOW (ref 11.7–15.4)
WBC: 7.6 10*3/uL (ref 3.4–10.8)

## 2022-05-23 ENCOUNTER — Other Ambulatory Visit: Payer: Self-pay | Admitting: Family Medicine

## 2022-05-23 MED ORDER — MOLNUPIRAVIR EUA 200MG CAPSULE: 4.0000 | ORAL_CAPSULE | Freq: Two times a day (BID) | ORAL | 0 refills | Status: AC

## 2022-05-23 NOTE — Progress Notes (Signed)
Due to positive COVID test will treat with molnupiravir.

## 2022-07-09 ENCOUNTER — Encounter: Payer: Self-pay | Admitting: Radiology

## 2022-07-13 ENCOUNTER — Ambulatory Visit (INDEPENDENT_AMBULATORY_CARE_PROVIDER_SITE_OTHER): Payer: Medicare Other | Admitting: Family Medicine

## 2022-07-13 VITALS — BP 117/67 | Ht 62.0 in | Wt 102.4 lb

## 2022-07-13 DIAGNOSIS — G894 Chronic pain syndrome: Secondary | ICD-10-CM | POA: Diagnosis not present

## 2022-07-13 DIAGNOSIS — R7303 Prediabetes: Secondary | ICD-10-CM | POA: Diagnosis not present

## 2022-07-13 DIAGNOSIS — Z79899 Other long term (current) drug therapy: Secondary | ICD-10-CM

## 2022-07-13 DIAGNOSIS — E7849 Other hyperlipidemia: Secondary | ICD-10-CM | POA: Diagnosis not present

## 2022-07-13 DIAGNOSIS — I480 Paroxysmal atrial fibrillation: Secondary | ICD-10-CM

## 2022-07-13 DIAGNOSIS — R079 Chest pain, unspecified: Secondary | ICD-10-CM | POA: Diagnosis not present

## 2022-07-13 MED ORDER — HYDROCODONE-ACETAMINOPHEN 10-325 MG PO TABS: 1.0000 | ORAL_TABLET | Freq: Three times a day (TID) | ORAL | 0 refills | Status: DC | PRN

## 2022-07-13 NOTE — Progress Notes (Signed)
   Subjective:    Patient ID: [name], female    DOB: 04/18/1941, 82 y.o.   MRN: [id number]  HPI This patient was seen today for chronic pain  The medication list was reviewed and updated.  Location of Pain for which the patient has been treated with regarding narcotics: back pain  Onset of this pain: years   -Compliance with medication: yes  - Number patient states they take daily: 3  -when was the last dose patient took? today  The patient was advised the importance of maintaining medication and not using illegal substances with these.  Here for refills and follow up  The patient was educated that we can provide 3 monthly scripts for their medication, it is their responsibility to follow the instructions.  Side effects or complications from medications: none  Patient is aware that pain medications are meant to minimize the severity of the pain to allow their pain levels to improve to allow for better function. They are aware of that pain medications cannot totally remove their pain.  Patient relates that pain medicine does help take the edge off the pain she takes 3/day she states it helps her function without it she states she cannot do what she does  She is under a lot of stress she helps take care of her husband and has advanced Alzheimer's.        Review of Systems     Objective:   Physical Exam  General-in no acute distress Eyes-no discharge Lungs-respiratory rate normal, CTA CV-no murmurs,RRR Extremities skin warm dry no edema Neuro grossly normal Behavior normal, alert  Mentally and physically she is getting worn down by taking care of her husband hopefully this will improve but she is at risk of burnout or physical ailments related to all of this     Assessment & Plan:  EKG no acute changes compared to previous  Patient physically and mentally worn down by her husband who has Alzheimer's.  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  Therefore I printed a prescription for pain medicine, she will follow-up here every 3 months  Hyperlipidemia check lipid profile continue current medications  Follow-up here approximately every 3 months

## 2022-07-14 ENCOUNTER — Encounter: Payer: Self-pay | Admitting: Family Medicine

## 2022-07-14 LAB — BASIC METABOLIC PANEL
BUN/Creatinine Ratio: 18 (ref 12–28)
BUN: 15 mg/dL (ref 8–27)
CO2: 23 mmol/L (ref 20–29)
Calcium: 9.5 mg/dL (ref 8.7–10.3)
Chloride: 104 mmol/L (ref 96–106)
Creatinine, Ser: 0.85 mg/dL (ref 0.57–1.00)
Glucose: 98 mg/dL (ref 70–99)
Potassium: 4 mmol/L (ref 3.5–5.2)
Sodium: 143 mmol/L (ref 134–144)
eGFR: 70 mL/min/{1.73_m2} (ref >59)

## 2022-07-14 LAB — LIPID PANEL
Chol/HDL Ratio: 2.4 ratio (ref 0.0–4.4)
Cholesterol, Total: 167 mg/dL (ref 100–199)
HDL: 70 mg/dL (ref >39)
LDL Chol Calc (NIH): 79 mg/dL (ref 0–99)
Triglycerides: 99 mg/dL (ref 0–149)
VLDL Cholesterol Cal: 18 mg/dL (ref 5–40)

## 2022-07-14 LAB — HEPATIC FUNCTION PANEL
ALT: 14 IU/L (ref 0–32)
AST: 17 IU/L (ref 0–40)
Albumin: 4.5 g/dL (ref 3.8–4.8)
Alkaline Phosphatase: 53 IU/L (ref 44–121)
Bilirubin Total: 0.3 mg/dL (ref 0.0–1.2)
Bilirubin, Direct: <0.1 mg/dL (ref 0.00–0.40)
Total Protein: 6.6 g/dL (ref 6.0–8.5)

## 2022-07-14 NOTE — Progress Notes (Signed)
Please mail to the patient she does not utilize [employer]

## 2022-08-04 ENCOUNTER — Ambulatory Visit: Payer: Medicare Other | Admitting: Student

## 2022-08-04 ENCOUNTER — Other Ambulatory Visit: Payer: Self-pay | Admitting: Cardiology

## 2022-08-04 ENCOUNTER — Other Ambulatory Visit: Payer: Self-pay | Admitting: Student

## 2022-08-05 ENCOUNTER — Other Ambulatory Visit: Payer: Self-pay | Admitting: Student

## 2022-08-05 NOTE — Telephone Encounter (Signed)
Prescription refill request for Eliquis received. Indication: PAF Last office visit: 08/08/21  B Strader PA-C Scr: 0.85 on 07/13/22 Age: 82 Weight: 48.1kg  Based on above findings Eliquis 5mg  twice daily is the appropriate dose.  Refill approved.  Pt is past due for MD appt.  Message sent to schedulers to make appt.

## 2022-08-10 ENCOUNTER — Other Ambulatory Visit (HOSPITAL_COMMUNITY): Payer: Self-pay | Admitting: Family Medicine

## 2022-08-10 DIAGNOSIS — Z1231 Encounter for screening mammogram for malignant neoplasm of breast: Secondary | ICD-10-CM

## 2022-08-10 DIAGNOSIS — H04123 Dry eye syndrome of bilateral lacrimal glands: Secondary | ICD-10-CM | POA: Diagnosis not present

## 2022-08-17 ENCOUNTER — Other Ambulatory Visit: Payer: Self-pay | Admitting: Family Medicine

## 2022-08-17 ENCOUNTER — Telehealth: Payer: Self-pay | Admitting: Family Medicine

## 2022-08-17 MED ORDER — HYDROCODONE-ACETAMINOPHEN 10-325 MG PO TABS: ORAL_TABLET | ORAL | 0 refills | Status: DC

## 2022-08-17 NOTE — Telephone Encounter (Signed)
Patient will need pain medicine follow-up by late May please let patient know that I did send in 2 additional prescriptions on her pain medicine thank you

## 2022-08-24 NOTE — Progress Notes (Unsigned)
GI Office Note    Referring Provider: Babs Sciara, MD Primary Care Physician:  Babs Sciara, MD Primary Gastroenterologist: Dolores Frame, MD   Date:  08/25/2022  ID:  [name], DOB [birth year], MRN [id number]   Chief Complaint   Chief Complaint  Patient presents with   Follow-up    Patient here today for a follow up. Patient denies any current gi issues. Denies any fatigue, shortness of breath, dizziness, or chest pain, no signs of dark or bloody stools.    History of Present Illness  [name] is a 82 y.o. female with a history of colon cancer s/p resection in 2006, GERD, gastroparesis, HLD, A-fib, and tobacco use presenting today for follow-up.  Capsule endoscopy August 2021: Incomplete as capsule did not reach cecum.  2 small jejunal diverticula without stigmata of bleeding, 2 areas of minus.  Advised CT enterography.  Colonoscopy May 2023: -3 (4 to 6 mm) polyps in the sigmoid and transverse colon -Patent end-to-side colocolonic anastomosis with healthy-appearing tissue -Polyps consistent with tubular adenoma x 3 fragments -Repeat colonoscopy in 3 years for surveillance  Last office visit 02/17/2022.  Taking iron daily.  Denied any melena, BRBPR, lack of appetite, early satiety, N/V, chest pain, shortness of breath, restless leg, pica, weight loss.  No upper GI complaints.  Having regular bowel movements with lactulose.  Having poor energy but likely secondary to taking care of her husband.  Usually gets about 3-4 hours of sleep nightly.  On midodrine which is controlling her blood pressure. Plan check CBC and iron panel, continue oral iron therapy and lactulose.  Labs October 2023: Hemoglobin 10.6, MCV 99, platelets 261, iron 144, ferritin 189, saturation 43%  Labs January 2024: Hemoglobin 10.6, MCV 101, platelets 293  Today:  Taking lactulose 10g once daily and having good results.  No overt constipation.  Still taking iron once daily.   No  complaints today.  Denies any melena, BRBPR, lack of appetite, early satiety, diarrhea, abdominal pain, nausea, vomiting, dysphagia, reflux, hematemesis, epistaxis.  Just lost her husband 3 weeks prior therefore she still has some fatigue and is going through the grieving process.  Current Outpatient Medications  Medication Sig Dispense Refill   alendronate (FOSAMAX) 70 MG tablet TAKE ONE TABLET (70 MG TOTAL) BY MOUTH EVERY 7 DAYS. TAKE WITH A FULL GLASS OF WATER ON AN EMPTY STOMACH. 4 tablet 5   apixaban (ELIQUIS) 5 MG TABS tablet TAKE ONE TABLET (5MG  TOTAL) BY MOUTH TWOTIMES DAILY 180 tablet 1   Calcium Carbonate (CALCIUM 600 PO) Take 600 mg by mouth daily.     ferrous sulfate 325 (65 FE) MG tablet Take 325 mg by mouth daily with breakfast.     flecainide (TAMBOCOR) 100 MG tablet TAKE ONE TABLET (100MG  TOTAL) BY MOUTH EVERY 12 HOURS 60 tablet 0   HYDROcodone-acetaminophen (NORCO) 10-325 MG tablet Take 1 tablet by mouth 3 (three) times daily as needed. 90 tablet 0   lactulose (CHRONULAC) 10 GM/15ML solution Take 20 g by mouth at bedtime.     midodrine (PROAMATINE) 5 MG tablet TAKE ONE TABLET (5MG  TOTAL) BY MOUTH THREE TIMES DAILY WITH MEALS 90 tablet 0   omeprazole (PRILOSEC) 20 MG capsule Take 20 mg by mouth daily before breakfast.      rosuvastatin (CRESTOR) 40 MG tablet TAKE ONE TABLET (40MG  TOTAL) BY MOUTH DAILY 90 tablet 3   vitamin B-12 (CYANOCOBALAMIN) 1000 MCG tablet Take 1,000 mcg by mouth daily.  No current facility-administered medications for this visit.    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   Stage I; resected in 03/2005   Arthritis    Colon cancer    Removed surgicaly    Dysrhythmia    Gastroesophageal reflux disease    Gastroparesis    Hearing loss    mild   Hyperlipidemia    Irregular heartbeat    Ovarian cyst 2008   Paroxysmal atrial fibrillation    Palpitations; maintained on flecainide; -normal coronary angiography and  1999; negative stress nuclear study in 11/2005   PONV (postoperative nausea and vomiting)    Tobacco abuse, in remission    10-pack-years; quit in 2001    Past Surgical History:  Procedure Laterality Date   ABDOMINAL HYSTERECTOMY  1983   BACK SURGERY     BIOPSY  10/18/2017   Procedure: BIOPSY;  Surgeon: Malissa Hippo, MD;  [facility];  Service: Endoscopy;;  gastric   BIOPSY  07/06/2018   Procedure: BIOPSY;  Surgeon: Malissa Hippo, MD;  [facility];  Service: Endoscopy;;  gastric   CATARACT EXTRACTION W/PHACO Right 12/22/2021   Procedure: CATARACT EXTRACTION PHACO AND INTRAOCULAR LENS PLACEMENT (IOC);  Surgeon: Fabio Pierce, MD;  Location: [facility];  Service: Ophthalmology;  Laterality: Right;  CDE: 13.86   CATARACT EXTRACTION W/PHACO Left 01/05/2022   Procedure: CATARACT EXTRACTION PHACO AND INTRAOCULAR LENS PLACEMENT (IOC);  Surgeon: Fabio Pierce, MD;  Location: [facility];  Service: Ophthalmology;  Laterality: Left;  CDE 21.39   CHOLECYSTECTOMY     COLON SURGERY  2006   Colon cancer   COLONOSCOPY  03/25/2012   Malissa Hippo, MD   COLONOSCOPY N/A 04/26/2017   Procedure: COLONOSCOPY;  Surgeon: Malissa Hippo, MD;  [facility];  Service: Endoscopy;  Laterality: N/A;  2:00   COLONOSCOPY N/A 07/06/2018   Procedure: COLONOSCOPY;  Surgeon: Malissa Hippo, MD;  [facility];  Service: Endoscopy;  Laterality: N/A;  10:30   COLONOSCOPY WITH PROPOFOL N/A 09/10/2021   Procedure: COLONOSCOPY WITH PROPOFOL;  Surgeon: Malissa Hippo, MD;  [facility];  Service: Endoscopy;  Laterality: N/A;  910   COSMETIC SURGERY  09/2007   ESOPHAGOGASTRODUODENOSCOPY N/A 10/18/2017   Procedure: ESOPHAGOGASTRODUODENOSCOPY (EGD);  Surgeon: Malissa Hippo, MD;  [facility];  Service: Endoscopy;  Laterality: N/A;   ESOPHAGOGASTRODUODENOSCOPY N/A 07/06/2018   Procedure: ESOPHAGOGASTRODUODENOSCOPY (EGD);  Surgeon: Malissa Hippo, MD;   [facility];  Service: Endoscopy;  Laterality: N/A;   GIVENS CAPSULE STUDY N/A 12/26/2019   Procedure: GIVENS CAPSULE STUDY;  Surgeon: Malissa Hippo, MD;  [facility];  Service: Endoscopy;  Laterality: N/A;  730   PARTIAL COLECTOMY  2006   adenocarcinoma   POLYPECTOMY  04/26/2017   Procedure: POLYPECTOMY;  Surgeon: Malissa Hippo, MD;  [facility];  Service: Endoscopy;;  colon   POLYPECTOMY  07/06/2018   Procedure: POLYPECTOMY;  Surgeon: Malissa Hippo, MD;  [facility];  Service: Endoscopy;;  colon    POLYPECTOMY  09/10/2021   Procedure: POLYPECTOMY;  Surgeon: Malissa Hippo, MD;  [facility];  Service: Endoscopy;;  transverse x2;sigmoid   TONSILLECTOMY      Family History  Problem Relation Age of Onset   Heart disease Mother    Stroke Mother    Heart attack Mother    Cancer Father  lung   Heart disease Father    Hypertension Father    Cancer Brother        rectal   Healthy Son    Heart failure Sister    Stroke Sister     Allergies as of 08/25/2022   (No Known Allergies)    Social History   Socioeconomic History   Marital status: Married    Spouse name: Not on file   Number of children: Not on file   Years of education: Not on file   Highest education level: Not on file  Occupational History   Occupation: Catering manager  Tobacco Use   Smoking status: Former    Packs/day: 0.30    Years: 40.00    Additional pack years: 0.00    Total pack years: 12.00    Types: Cigarettes    Start date: 02/23/1963    Quit date: 03/25/2002    Years since quitting: 20.4   Smokeless tobacco: Never   Tobacco comments:    Quit smoking in 2001  Vaping Use   Vaping Use: Never used  Substance and Sexual Activity   Alcohol use: No    Alcohol/week: 0.0 standard drinks of alcohol   Drug use: No   Sexual activity: Yes    Birth control/protection: Surgical  Other Topics Concern   Not on file  Social History  Narrative   Lives with husband in a one story home.  Has 3 children.  Retired from Warehouse manager work.  Education: some college.   Social Determinants of Health   Financial Resource Strain: Low Risk  (09/09/2021)   Overall Financial Resource Strain (CARDIA)    Difficulty of Paying Living Expenses: Not hard at all  Food Insecurity: No Food Insecurity (09/09/2021)   Hunger Vital Sign    Worried About Running Out of Food in the Last Year: Never true    Ran Out of Food in the Last Year: Never true  Transportation Needs: No Transportation Needs (09/09/2021)   PRAPARE - Administrator, Civil Service (Medical): No    Lack of Transportation (Non-Medical): No  Physical Activity: Sufficiently Active (09/09/2021)   Exercise Vital Sign    Days of Exercise per Week: 5 days    Minutes of Exercise per Session: 30 min  Stress: No Stress Concern Present (09/09/2021)   [employer] of Occupational Health - Occupational Stress Questionnaire    Feeling of Stress : Only a little  Social Connections: Socially Integrated (09/09/2021)   Social Connection and Isolation Panel [NHANES]    Frequency of Communication with Friends and Family: More than three times a week    Frequency of Social Gatherings with Friends and Family: More than three times a week    Attends Religious Services: More than 4 times per year    Active Member of [employer] or Organizations: Yes    Attends Engineer, structural: More than 4 times per year    Marital Status: Married     Review of Systems   Gen: + fatigue. Denies fever, chills, anorexia. Denies weakness, weight loss.  CV: Denies chest pain, palpitations, syncope, peripheral edema, and claudication. Resp: Denies dyspnea at rest, cough, wheezing, coughing up blood, and pleurisy. GI: See HPI Derm: Denies rash, itching, dry skin Psych: + grief. Denies depression, anxiety, memory loss, confusion. No homicidal or suicidal ideation.  Heme: Denies bruising, bleeding, and  enlarged lymph nodes.   Physical Exam   BP (!) 164/74 (BP Location: Left Arm, Patient Position: Sitting,  Cuff Size: Normal)   Pulse 61   Temp 97.8 F (36.6 C) (Temporal)   Ht  (1.575 m)   Wt 101 lb 9.6 oz (46.1 kg)   BMI 18.58 kg/m   General:   Alert and oriented. No distress noted. Pleasant and cooperative.  Head:  Normocephalic and atraumatic. Eyes:  Conjuctiva clear without scleral icterus. Mouth:  Oral mucosa pink and moist. Good dentition. No lesions. Lungs:  Clear to auscultation bilaterally. No wheezes, rales, or rhonchi. No distress.  Heart:  S1, S2 present without murmurs appreciated.  Abdomen:  +BS, soft, non-tender and non-distended. Flat. No rebound or guarding. No HSM or masses noted. Rectal: deferred Msk:  Symmetrical without gross deformities. Normal posture. Extremities:  Without edema. Neurologic:  Alert and  oriented x4 Psych:  Alert and cooperative. Normal mood and affect.  Intermittently tearful.   Assessment  [name] is a 82 y.o. female with a history of iron deficiency anemia, colon cancer s/p resection in 2006, GERD, gastroparesis, HLD, A-fib on Eliquis, and tobacco use presenting today for follow-up.  Iron deficiency anemia: Colonoscopy in May 2023 with 3 tubular, due for surveillance in 2026 given suboptimal prep in the cecum.  Capsule study incomplete in 2021 with 2 additional diverticular without bleeding and possible ileitis.  Chronically on Eliquis for A-fib and maintained on oral iron daily.  Continues to deny any overt GI bleeding.  Continues to have fatigue given care for her husband who she just recently lost about 3 weeks ago otherwise stable.  Most recent hemoglobin check in January 2024 stable at 10.6.  Will recheck again today and if no improvement or she has any decline we will definitely proceed with repeat capsule endoscopy for complete workup.  Constipation: Well-controlled with lactulose 15 mL daily.  PLAN   CBC, iron  panel Continue oral iron 325 mg daily Continue lactulose 10 g daily Monitor for any signs of overt GI bleeding Consider capsule endoscopy if no improvement in Hgb with this lab check. Patient agreeable.  Advised to monitor blood pressure at home and if it remains elevated that she may need adjustment to her midodrine regimen. Follow up in 6 months.     Brooke Bonito, MSN, FNP-BC, AGACNP-BC [facility].  I have reviewed the note and agree with the APP's assessment as described in this progress note  Katrinka Blazing, MD Gastroenterology and Hepatology [facility]

## 2022-08-25 ENCOUNTER — Encounter: Payer: Self-pay | Admitting: Gastroenterology

## 2022-08-25 ENCOUNTER — Ambulatory Visit (INDEPENDENT_AMBULATORY_CARE_PROVIDER_SITE_OTHER): Payer: Medicare Other | Admitting: Gastroenterology

## 2022-08-25 VITALS — BP 166/75 | HR 60 | Temp 97.8°F | Ht 62.0 in | Wt 101.6 lb

## 2022-08-25 DIAGNOSIS — K5900 Constipation, unspecified: Secondary | ICD-10-CM

## 2022-08-25 DIAGNOSIS — D509 Iron deficiency anemia, unspecified: Secondary | ICD-10-CM | POA: Diagnosis not present

## 2022-08-25 DIAGNOSIS — Z85038 Personal history of other malignant neoplasm of large intestine: Secondary | ICD-10-CM

## 2022-08-25 NOTE — Patient Instructions (Addendum)
Please have blood work completed at [facility].  We will call you with results once they have been received. Please allow 3-5 business days for review. 2 locations for [facility] in [place]:              1. [address], [place]              2. [address] [place], [place]   Continue taking your iron once daily.   Continue lactulose daily for constipation.  If you begin to experience any black/tarry stools, or bright red blood in your stools please call the office immediately.  We will plan to follow-up in 6 months, sooner if needed.  It was a pleasure to see you today. I want to create trusting relationships with patients. If you receive a survey regarding your visit,  I greatly appreciate you taking time to fill this out on paper or through your MyChart. I value your feedback.  Brooke Bonito, MSN, FNP-BC, AGACNP-BC [facility]

## 2022-08-26 ENCOUNTER — Telehealth: Payer: Self-pay | Admitting: Cardiology

## 2022-08-26 LAB — CBC
Hematocrit: 34.3 % (ref 34.0–46.6)
Hemoglobin: 11 g/dL — ABNORMAL LOW (ref 11.1–15.9)
MCH: 32.5 pg (ref 26.6–33.0)
MCHC: 32.1 g/dL (ref 31.5–35.7)
MCV: 102 fL — ABNORMAL HIGH (ref 79–97)
Platelets: 290 10*3/uL (ref 150–450)
RBC: 3.38 x10E6/uL — ABNORMAL LOW (ref 3.77–5.28)
RDW: 11.8 % (ref 11.7–15.4)
WBC: 7.7 10*3/uL (ref 3.4–10.8)

## 2022-08-26 LAB — IRON,TIBC AND FERRITIN PANEL
Ferritin: 180 ng/mL — ABNORMAL HIGH (ref 15–150)
Iron Saturation: 28 % (ref 15–55)
Iron: 99 ug/dL (ref 27–139)
Total Iron Binding Capacity: 351 ug/dL (ref 250–450)
UIBC: 252 ug/dL (ref 118–369)

## 2022-08-26 NOTE — Telephone Encounter (Signed)
Pt states that over the last 2 days she does not "feel right". She reports feeling dizzy and has a headache. She was seen by the GI and blood pressure was noted to be high at 145/?, 164/74. When she got home she rechecked her BP and it was 201/ ?. On this morning her BP was 190/86. Patient is on Midodrine and only took one pill on yesterday and none today. Pt states that her chest does not feel right. Please advise.

## 2022-08-26 NOTE — Telephone Encounter (Signed)
Can she get a PA appt either here or [place]. Continue to stay off midodrine for the time being given high blood pressures. Please educate her on proper bp measuring at home including sitting calmly for 5 minutes, both feet flat on the floor.   Dominga Ferry MD

## 2022-08-26 NOTE — Telephone Encounter (Signed)
Pt notified of Dr. Verna Czech response. Appt made for tomorrow with B.Strader, PA.

## 2022-08-26 NOTE — Telephone Encounter (Signed)
Pt c/o BP issue: STAT if pt c/o blurred vision, one-sided weakness or slurred speech  1. What are your last 5 BP readings?  04/17 9 A.M. - 190/86 04/16 readings:  164/74 - at gastrologist office  201/90 - took again when home    2. Are you having any other symptoms (ex. Dizziness, headache, blurred vision, passed out)? Headache and mobility is off.   3. What is your BP issue? Patient states she doesn't feel right and was told yesterday to call at gastrologist office, but went home and laid down.  She states she is on medication for low bp, and would like to know if she is to continue this medication.

## 2022-08-27 ENCOUNTER — Encounter: Payer: Self-pay | Admitting: Student

## 2022-08-27 ENCOUNTER — Ambulatory Visit: Payer: Medicare Other | Attending: Student | Admitting: Student

## 2022-08-27 VITALS — BP 152/82 | HR 50 | Ht 62.0 in | Wt 101.0 lb

## 2022-08-27 DIAGNOSIS — E785 Hyperlipidemia, unspecified: Secondary | ICD-10-CM | POA: Diagnosis not present

## 2022-08-27 DIAGNOSIS — I951 Orthostatic hypotension: Secondary | ICD-10-CM | POA: Diagnosis not present

## 2022-08-27 DIAGNOSIS — R079 Chest pain, unspecified: Secondary | ICD-10-CM | POA: Diagnosis not present

## 2022-08-27 DIAGNOSIS — I480 Paroxysmal atrial fibrillation: Secondary | ICD-10-CM

## 2022-08-27 NOTE — Patient Instructions (Signed)
Medication Instructions:   Your physician has recommended you make the following change in your medication:   Stop Taking Midodrine   *If you need a refill on your cardiac medications before your next appointment, please call your pharmacy*   Lab Work: NONE   If you have labs (blood work) drawn today and your tests are completely normal, you will receive your results only by: MyChart Message (if you have MyChart) OR A paper copy in the mail If you have any lab test that is abnormal or we need to change your treatment, we will call you to review the results.   Testing/Procedures: Your physician has requested that you have an echocardiogram. Echocardiography is a painless test that uses sound waves to create images of your heart. It provides your doctor with information about the size and shape of your heart and how well your heart's chambers and valves are working. This procedure takes approximately one hour. There are no restrictions for this procedure. Please do NOT wear cologne, perfume, aftershave, or lotions (deodorant is allowed). Please arrive 15 minutes prior to your appointment time.     Follow-Up: At [facility], you and your health needs are our priority.  As part of our continuing mission to provide you with exceptional heart care, we have created designated Provider Care Teams.  These Care Teams include your primary Cardiologist (physician) and Advanced Practice Providers (APPs -  Physician Assistants and Nurse Practitioners) who all work together to provide you with the care you need, when you need it.  We recommend signing up for the patient portal called "MyChart".  Sign up information is provided on this After Visit Summary.  MyChart is used to connect with patients for Virtual Visits (Telemedicine).  Patients are able to view lab/test results, encounter notes, upcoming appointments, etc.  Non-urgent messages can be sent to your provider as well.   To learn more  about what you can do with MyChart, go to [email or web address].    Your next appointment:   May    Provider:   Randall An, PA-C    Other Instructions Thank you for choosing [facility]!

## 2022-08-27 NOTE — Progress Notes (Signed)
Cardiology Office Note    Date:  08/27/2022  ID:  Afsa, Meany 1940-11-11, MRN [id number] Cardiologist: Dina Rich, MD    History of Present Illness:    [name] is a 82 y.o. female with past medical history of palpitations (monitor in 08/2018 showing PAC's, PVC's and atrial tachycardia), remote history of atrial fibrillation (on Flecainide), orthostatic hypotension, anemia and HLD who presents to the office today for evaluation of variable BP.  She was evaluated by myself in 07/2021 and had been experiencing sinus congestion but denied any recent palpitations or anginal symptoms. She had previously experienced dizziness but this had resolved with Midodrine being titrated to 5 mg 3 times daily.  She was continued on Flecainide 100 mg twice daily along with Eliquis 5 mg twice daily for anticoagulation.  She called the office on 08/26/2022 reporting elevated BP with associated headache. BP had been elevated at 164/74 when checked by her gastroenterologist since elevated to a 201/91 at home. She had self discontinued Midodrine and a follow-up visit was arranged.   In talking with the patient today, she reports feeling "off" for the past week but was not aware her BP was elevated until it was checked at an office visit earlier this week. Last dose of Midodrine was two days ago and BP was elevated yesterday but SBP had improved into the 110's last night. She reports feeling funny in her chest when BP is elevated. Occurs at rest and no association with exertion. No specific orthopnea, PND or pitting edema. Unfortunately, her husband did pass away last month and we reviewed the stress of this could be contributing. He passed away from complications with dementia and she was his primary caregiver.   Studies Reviewed:   EKG: EKG is not ordered today. EKG from 07/13/2022 is reviewed and demonstrates NSR, HR 61 with known RBBB.   NST: 08/2017 There was no ST segment deviation noted during  stress. The study is normal. There are no perfusion defects consistent with prior infarct or current ischemia. This is a low risk study. The left ventricular ejection fraction is normal (55-65%).  Event Monitor: 08/2018 48 hr holter monitor Min HR 50, Max HR 103, Avg HR 68 Rare ventricular ectopy, 2 isolated PVCs Rare supraventricular ectopy. Primarily PACs. Three short runs of atach, longest 4 beats. Reported symptoms correlated with sinus rhythm   Physical Exam:   VS:  BP (!) 152/82   Pulse (!) 50   Ht  (1.575 m)   Wt 101 lb (45.8 kg)   SpO2 100%   BMI 18.47 kg/m    Wt Readings from Last 3 Encounters:  08/27/22 101 lb (45.8 kg)  08/25/22 101 lb 9.6 oz (46.1 kg)  07/13/22 102 lb 6.4 oz (46.4 kg)     GEN: Well nourished, well developed female appearing in no acute distress NECK: No JVD; No carotid bruits CARDIAC: RRR, no murmurs, rubs, gallops RESPIRATORY:  Clear to auscultation without rales, wheezing or rhonchi  ABDOMEN: Appears non-distended. No obvious abdominal masses. EXTREMITIES: No clubbing or cyanosis. No pitting edema.  Distal pedal pulses are 2+ bilaterally.   Assessment and Plan:   1. Orthostatic Hypotension - Recently, her BP has been elevated as discussed above and I suspect the emotional stress of her husband recently passing away is contributing to this. I recommended she remain off Midodrine for now. She was provided with a BP log and will return this in several weeks.   2. Paroxysmal Atrial  Fibrillation - She denies any palpitations resembling her prior atrial fibrillation. Remains on Flecainide  BID.  - No reports of active bleeding. Continue Eliquis  BID for anticoagulation. Given her weight of 101 lbs, dosing will need to be reduced to 2.5mg  BID once she turns 82 yo in 02/2023.  3. Atypical Chest Pain - Her recent discomfort occurred at rest and in the setting of elevated BP. Reviewed with the patient and will obtain an echocardiogram  for initial assessment to rule out any structural abnormalities. If she continues to have discomfort despite adequate control of her BP, could proceed with a repeat NST (last ischemic evaluation was in 08/2017).   4. HLD - FLP in 07/2022 showed total cholesterol 167, triglycerides 99, HDL 70 and LDL 79. LFT's were WNL. Continue current medical therapy with Crestor 40 mg daily.  Signed, Ellsworth Lennox, PA-C

## 2022-09-01 ENCOUNTER — Other Ambulatory Visit: Payer: Self-pay | Admitting: Student

## 2022-09-03 ENCOUNTER — Other Ambulatory Visit: Payer: Self-pay | Admitting: Family Medicine

## 2022-09-11 ENCOUNTER — Ambulatory Visit (HOSPITAL_COMMUNITY): Payer: Medicare Other

## 2022-09-14 ENCOUNTER — Ambulatory Visit (HOSPITAL_COMMUNITY)
Admission: RE | Admit: 2022-09-14 | Discharge: 2022-09-14 | Disposition: A | Discharge status: Home / Self Care | Payer: Medicare Other | Source: Ambulatory Visit | Attending: Family Medicine | Admitting: Family Medicine

## 2022-09-14 ENCOUNTER — Encounter (HOSPITAL_COMMUNITY): Payer: Self-pay

## 2022-09-14 DIAGNOSIS — Z1231 Encounter for screening mammogram for malignant neoplasm of breast: Secondary | ICD-10-CM | POA: Diagnosis not present

## 2022-09-18 ENCOUNTER — Ambulatory Visit (INDEPENDENT_AMBULATORY_CARE_PROVIDER_SITE_OTHER): Payer: Medicare Other

## 2022-09-18 VITALS — BP 162/66 | HR 62 | Ht 62.0 in | Wt 102.0 lb

## 2022-09-18 DIAGNOSIS — Z0000 Encounter for general adult medical examination without abnormal findings: Secondary | ICD-10-CM

## 2022-09-18 NOTE — Progress Notes (Signed)
. I connected with  [name] on 09/18/22 by a audio enabled telemedicine application and verified that I am speaking with the correct person using two identifiers.  Patient Location: Home  Provider Location: Home Office  I discussed the limitations of evaluation and management by telemedicine. The patient expressed understanding and agreed to proceed.  Subjective:   [name] is a 82 y.o. female who presents for Medicare Annual (Subsequent) preventive examination.  Review of Systems     Cardiac Risk Factors include: advanced age (>95men, >73 women);hypertension;Other (see comment) (AFib), Risk factor comments: hx of AFib     Objective:    Today's Vitals   09/18/22 1444 09/18/22 1446  BP: (!) 162/66   Pulse: 62   Weight: 102 lb (46.3 kg)   Height: 5\' 2"  (1.575 m)   PainSc:  8    Body mass index is 18.66 kg/m.     09/18/2022    2:58 PM 01/05/2022   12:40 PM 12/22/2021    8:10 AM 09/09/2021    1:12 PM 09/08/2021   11:31 AM 07/02/2021    2:45 PM 04/18/2020   11:56 AM  Advanced Directives  Does Patient Have a Medical Advance Directive? Yes No No No No No No  Type of Estate agent of [place];Living will        Copy of Healthcare Power of Attorney in Chart? No - copy requested        Would patient like information on creating a medical advance directive?  No - Patient declined No - Patient declined No - Patient declined No - Patient declined No - Patient declined No - Patient declined    Current Medications (verified) Outpatient Encounter Medications as of 09/18/2022  Medication Sig   alendronate (FOSAMAX) 70 MG tablet TAKE ONE TABLET (70 MG TOTAL) BY MOUTH EVERY 7 DAYS. TAKE WITH A FULL GLASS OF WATER ON AN EMPTY STOMACH.   apixaban (ELIQUIS) 5 MG TABS tablet TAKE ONE TABLET (5MG  TOTAL) BY MOUTH TWOTIMES DAILY   Calcium Carbonate (CALCIUM 600 PO) Take 600 mg by mouth daily.   ferrous sulfate 325 (65 FE) MG tablet Take 325 mg by mouth daily with  breakfast.   flecainide (TAMBOCOR) 100 MG tablet TAKE ONE TABLET (100MG  TOTAL) BY MOUTH EVERY 12 HOURS   HYDROcodone-acetaminophen (NORCO) 10-325 MG tablet Take 1 tablet by mouth 3 (three) times daily as needed.   lactulose (CHRONULAC) 10 GM/15ML solution Take 20 g by mouth at bedtime.   omeprazole (PRILOSEC) 20 MG capsule Take 20 mg by mouth daily before breakfast.    rosuvastatin (CRESTOR) 40 MG tablet TAKE ONE TABLET (40MG  TOTAL) BY MOUTH DAILY   vitamin B-12 (CYANOCOBALAMIN) 1000 MCG tablet Take 1,000 mcg by mouth daily.   Zoster Vaccine Adjuvanted [facility]) injection Inject 0.5 mLs into the muscle once.   AREXVY 120 MCG/0.5ML injection Inject 0.5 mLs into the muscle once. (Patient not taking: Reported on 09/18/2022)   No facility-administered encounter medications on file as of 09/18/2022.    Allergies (verified) Patient has no known allergies.   History: Past Medical History:  Diagnosis Date   Adenocarcinoma, colon (HCC) 03/2005   Stage I; resected in 03/2005   Arthritis    Colon cancer [facility])    Removed surgicaly    Dysrhythmia    Gastroesophageal reflux disease    Gastroparesis    Hearing loss    mild   Hyperlipidemia    Irregular heartbeat    Ovarian cyst  2008   Paroxysmal atrial fibrillation (HCC)    Palpitations; maintained on flecainide; -normal coronary angiography and 1999; negative stress nuclear study in 11/2005   PONV (postoperative nausea and vomiting)    Tobacco abuse, in remission    10-pack-years; quit in 2001   Past Surgical History:  Procedure Laterality Date   ABDOMINAL HYSTERECTOMY  1983   BACK SURGERY     BIOPSY  10/18/2017   Procedure: BIOPSY;  Surgeon: Malissa Hippo, MD;  [facility];  Service: Endoscopy;;  gastric   BIOPSY  07/06/2018   Procedure: BIOPSY;  Surgeon: Malissa Hippo, MD;  [facility];  Service: Endoscopy;;  gastric   CATARACT EXTRACTION W/PHACO Right 12/22/2021   Procedure: CATARACT EXTRACTION PHACO AND  INTRAOCULAR LENS PLACEMENT (IOC);  Surgeon: Fabio Pierce, MD;  Location: [facility];  Service: Ophthalmology;  Laterality: Right;  CDE: 13.86   CATARACT EXTRACTION W/PHACO Left 01/05/2022   Procedure: CATARACT EXTRACTION PHACO AND INTRAOCULAR LENS PLACEMENT (IOC);  Surgeon: Fabio Pierce, MD;  Location: [facility];  Service: Ophthalmology;  Laterality: Left;  CDE 21.39   CHOLECYSTECTOMY     COLON SURGERY  2006   Colon cancer   COLONOSCOPY  03/25/2012   Malissa Hippo, MD   COLONOSCOPY N/A 04/26/2017   Procedure: COLONOSCOPY;  Surgeon: Malissa Hippo, MD;  [facility];  Service: Endoscopy;  Laterality: N/A;  2:00   COLONOSCOPY N/A 07/06/2018   Procedure: COLONOSCOPY;  Surgeon: Malissa Hippo, MD;  [facility];  Service: Endoscopy;  Laterality: N/A;  10:30   COLONOSCOPY WITH PROPOFOL N/A 09/10/2021   Procedure: COLONOSCOPY WITH PROPOFOL;  Surgeon: Malissa Hippo, MD;  [facility];  Service: Endoscopy;  Laterality: N/A;  910   COSMETIC SURGERY  09/2007   ESOPHAGOGASTRODUODENOSCOPY N/A 10/18/2017   Procedure: ESOPHAGOGASTRODUODENOSCOPY (EGD);  Surgeon: Malissa Hippo, MD;  [facility];  Service: Endoscopy;  Laterality: N/A;   ESOPHAGOGASTRODUODENOSCOPY N/A 07/06/2018   Procedure: ESOPHAGOGASTRODUODENOSCOPY (EGD);  Surgeon: Malissa Hippo, MD;  [facility];  Service: Endoscopy;  Laterality: N/A;   GIVENS CAPSULE STUDY N/A 12/26/2019   Procedure: GIVENS CAPSULE STUDY;  Surgeon: Malissa Hippo, MD;  [facility];  Service: Endoscopy;  Laterality: N/A;  730   PARTIAL COLECTOMY  2006   adenocarcinoma   POLYPECTOMY  04/26/2017   Procedure: POLYPECTOMY;  Surgeon: Malissa Hippo, MD;  [facility];  Service: Endoscopy;;  colon   POLYPECTOMY  07/06/2018   Procedure: POLYPECTOMY;  Surgeon: Malissa Hippo, MD;  [facility];  Service: Endoscopy;;  colon    POLYPECTOMY  09/10/2021   Procedure: POLYPECTOMY;   Surgeon: Malissa Hippo, MD;  [facility];  Service: Endoscopy;;  transverse x2;sigmoid   TONSILLECTOMY     Family History  Problem Relation Age of Onset   Heart disease Mother    Stroke Mother    Heart attack Mother    Cancer Father        lung   Heart disease Father    Hypertension Father    Cancer Brother        rectal   Healthy Son    Heart failure Sister    Stroke Sister    Social History   Socioeconomic History   Marital status: Married    Spouse name: Not on file   Number of children: Not on file   Years of education:  Not on file   Highest education level: Not on file  Occupational History   Occupation: Bookkeeper  Tobacco Use   Smoking status: Former    Packs/day: 0.30    Years: 40.00    Additional pack years: 0.00    Total pack years: 12.00    Types: Cigarettes    Start date: 02/23/1963    Quit date: 03/25/2002    Years since quitting: 20.4   Smokeless tobacco: Never   Tobacco comments:    Quit smoking in 2001  Vaping Use   Vaping Use: Never used  Substance and Sexual Activity   Alcohol use: No    Alcohol/week: 0.0 standard drinks of alcohol   Drug use: No   Sexual activity: Yes    Birth control/protection: Surgical  Other Topics Concern   Not on file  Social History Narrative   09/26/2022: husband passed away ~ 5 weeks ago      Lives with husband in a one story home.  Has 3 children.  Retired from Warehouse manager work.  Education: some college.   Social Determinants of Health   Financial Resource Strain: Low Risk  (26-Sep-2022)   Overall Financial Resource Strain (CARDIA)    Difficulty of Paying Living Expenses: Not hard at all  Food Insecurity: No Food Insecurity (09/26/22)   Hunger Vital Sign    Worried About Running Out of Food in the Last Year: Never true    Ran Out of Food in the Last Year: Never true  Transportation Needs: No Transportation Needs (09-26-2022)   PRAPARE - Administrator, Civil Service (Medical): No     Lack of Transportation (Non-Medical): No  Physical Activity: Sufficiently Active (Sep 26, 2022)   Exercise Vital Sign    Days of Exercise per Week: 7 days    Minutes of Exercise per Session: 30 min  Stress: No Stress Concern Present (09-26-22)   [employer] of Occupational Health - Occupational Stress Questionnaire    Feeling of Stress : Not at all  Social Connections: Socially Integrated (09-26-2022)   Social Connection and Isolation Panel [NHANES]    Frequency of Communication with Friends and Family: More than three times a week    Frequency of Social Gatherings with Friends and Family: More than three times a week    Attends Religious Services: More than 4 times per year    Active Member of [employer] or Organizations: Yes    Attends Engineer, structural: More than 4 times per year    Marital Status: Married    Tobacco Counseling Counseling given: Yes Tobacco comments: Quit smoking in 2001   Clinical Intake:  Pre-visit preparation completed: Yes  Pain : 0-10 Pain Score: 8  Pain Type: Chronic pain Pain Location: Back Pain Onset: More than a month ago Pain Frequency: Constant     BMI - recorded: 18.66 Nutritional Status: BMI <19  Underweight Nutritional Risks: None Diabetes: No     Diabetic? No      Activities of Daily Living    09/26/22    3:01 PM 12/15/2021    8:42 AM  In your present state of health, do you have any difficulty performing the following activities:  Hearing? 0 0  Vision? 0 1  Difficulty concentrating or making decisions? 0 0  Walking or climbing stairs? 0 0  Dressing or bathing? 0 0  Doing errands, shopping? 0   Preparing Food and eating ? N   Using the Toilet? N   In the past  six months, have you accidently leaked urine? N   Do you have problems with loss of bowel control? N   Managing your Medications? N   Managing your Finances? N   Housekeeping or managing your Housekeeping? N     Patient Care Team: Babs Sciara,  MD as PCP - General Branch, Dorothe Pea, MD as PCP - Cardiology (Cardiology) Duke Salvia, MD (Cardiology) Malissa Hippo, MD (Inactive) (Gastroenterology)  Indicate any recent Medical Services you may have received from other than [facility] providers in the past year (date may be approximate).     Assessment:   This is a routine wellness examination for [facility].  Hearing/Vision screen Hearing Screening - Comments:: Patient denies any difficulty hearing Vision Screening - Comments:: Denies any difficulty seeing. Had cataract surgery last year  Dietary issues and exercise activities discussed: Current Exercise Habits: Home exercise routine, Type of exercise: treadmill, Time (Minutes): 30, Frequency (Times/Week): 5, Weekly Exercise (Minutes/Week): 150, Exercise limited by: None identified   Goals Addressed             This Visit's Progress    Patient Stated       Patient states her goal is to continue to stay active and as healthy as possible        Depression Screen    09/18/2022    2:55 PM 07/13/2022    1:57 PM 04/09/2022    3:10 PM 09/09/2021    1:08 PM 12/26/2020    2:31 PM 06/26/2020    1:49 PM 04/24/2020    2:05 PM  PHQ 2/9 Scores  PHQ - 2 Score 0 0 0 0 0 0 0  PHQ- 9 Score   0        Fall Risk    09/18/2022    2:59 PM 07/13/2022    1:56 PM 10/08/2021    1:08 PM 09/09/2021    1:12 PM 05/26/2021    2:42 PM  Fall Risk   Falls in the past year? 0 0 1 1 1   Number falls in past yr: 0  0 0 0  Injury with Fall? 0  1 1 1   Risk for fall due to : No Fall Risks No Fall Risks Impaired balance/gait History of fall(s);Impaired balance/gait History of fall(s)  Follow up Falls prevention discussed Falls evaluation completed Falls evaluation completed Falls prevention discussed Falls evaluation completed    FALL RISK PREVENTION PERTAINING TO THE HOME:  Any stairs in or around the home? No  If so, are there any without handrails? No  Home free of loose throw rugs in walkways, pet  beds, electrical cords, etc? Yes  Adequate lighting in your home to reduce risk of falls? Yes   ASSISTIVE DEVICES UTILIZED TO PREVENT FALLS:  Life alert? No  Use of a cane, walker or w/c? No  Grab bars in the bathroom? yes Shower chair or bench in shower? yes Elevated toilet seat or a handicapped toilet? yes  TIMED UP AND GO:  Was the test performed? No .  Telephone visit    Cognitive Function:        09/18/2022    3:02 PM 09/09/2021    1:18 PM  6CIT Screen  What Year? 0 points 0 points  What month? 0 points 0 points  What time? 0 points 0 points  Count back from 20 0 points 0 points  Months in reverse 0 points 0 points  Repeat phrase 0 points 0 points  Total Score 0  points 0 points    Immunizations Immunization History  Administered Date(s) Administered   Fluad Quad(high Dose 65+) 03/13/2020, 03/12/2021, 02/25/2022   Influenza Split 02/21/2013   Influenza,inj,Quad PF,6+ Mos 03/29/2014, 03/06/2015, 03/10/2016, 03/01/2017, 02/15/2018, 02/01/2019   Moderna Sars-Covid-2 Vaccination 07/06/2019, 08/02/2019, 04/10/2020   PNEUMOCOCCAL CONJUGATE-20 04/09/2022   Pneumococcal Conjugate-13 03/29/2014   Pneumococcal Polysaccharide-23 03/30/2008   RSV,unspecified 04/14/2022   Td 09/11/2014   Zoster Recombinat (Shingrix) 03/11/2017, 07/13/2017   Zoster, Live 03/11/2017    TDAP status: Up to date  Flu Vaccine status: Up to date  Pneumococcal vaccine status: Up to date  Covid-19 vaccine status: Completed vaccines  Qualifies for Shingles Vaccine? Yes   Zostavax completed Yes   Shingrix Completed?: Yes  Screening Tests Health Maintenance  Topic Date Due   COVID-19 Vaccine (4 - 2023-24 season) 01/09/2022   INFLUENZA VACCINE  12/10/2022   Medicare Annual Wellness (AWV)  09/18/2023   DTaP/Tdap/Td (2 - Tdap) 09/10/2024   COLONOSCOPY (Pts 45-49yrs Insurance coverage will need to be confirmed)  09/10/2024   Pneumonia Vaccine 10+ Years old  Completed   DEXA SCAN  Completed    Hepatitis C Screening  Completed   Zoster Vaccines- Shingrix  Completed   HPV VACCINES  Aged Out    Health Maintenance  Health Maintenance Due  Topic Date Due   COVID-19 Vaccine (4 - 2023-24 season) 01/09/2022    Colorectal cancer screening: Type of screening: Colonoscopy. Completed 09/10/2021. Repeat every 3 years  Mammogram status: Completed 09/15/2022. Repeat every year  Bone Density status: Completed 2023. Results reflect: Bone density results: OSTEOPOROSIS. Repeat every 2 years.  Lung Cancer Screening: (Low Dose CT Chest recommended if Age 55-80 years, 30 pack-year currently smoking OR have quit w/in 15years.) does not qualify.     Additional Screening:  Hepatitis C Screening: does qualify; Completed 09/30/2020  Vision Screening: Recommended annual ophthalmology exams for early detection of glaucoma and other disorders of the eye. Is the patient up to date with their annual eye exam?  Yes  Who is the provider or what is the name of the office in which the patient attends annual eye exams? Dr.Cotter If pt is not established with a provider, would they like to be referred to a provider to establish care? No .   Dental Screening: Recommended annual dental exams for proper oral hygiene  Community Resource Referral / Chronic Care Management: CRR required this visit?  No   CCM required this visit?  No      Plan:     I have personally reviewed and noted the following in the patient's chart:   Medical and social history Use of alcohol, tobacco or illicit drugs  Current medications and supplements including opioid prescriptions. Patient is currently taking opioid prescriptions. Information provided to patient regarding non-opioid alternatives. Patient advised to discuss non-opioid treatment plan with their provider. Functional ability and status Nutritional status Physical activity Advanced directives List of other physicians Hospitalizations, surgeries, and ER visits  in previous 12 months Vitals Screenings to include cognitive, depression, and falls Referrals and appointments  In addition, I have reviewed and discussed with patient certain preventive protocols, quality metrics, and best practice recommendations. A written personalized care plan for preventive services as well as general preventive health recommendations were provided to patient.   Due to this being a telephonic visit, the after visit summary with patients personalized plan was offered to patient via mail or my-chart. Patient would like to access on my-chart [employer], CMA  09/18/2022   Nurse Notes:

## 2022-09-18 NOTE — Patient Instructions (Addendum)
[name] , Thank you for taking time to come for your Medicare Wellness Visit. I appreciate your ongoing commitment to your health goals. Please review the following plan we discussed and let me know if I can assist you in the future.   These are the goals we discussed:  Aim for 30 minutes of exercise or brisk walking, 6-8 glasses of water, and 5 servings of fruits and vegetables each day.  Managing Pain Without Opioids Opioids are strong medicines used to treat moderate to severe pain. For some people, especially those who have long-term (chronic) pain, opioids may not be the best choice for pain management due to: Side effects like nausea, constipation, and sleepiness. The risk of addiction (opioid use disorder). The longer you take opioids, the greater your risk of addiction. Pain that lasts for more than 3 months is called chronic pain. Managing chronic pain usually requires more than one approach and is often provided by a team of health care providers working together (multidisciplinary approach). Pain management may be done at a pain management center or pain clinic. How to manage pain without the use of opioids Use non-opioid medicines Non-opioid medicines for pain may include: Over-the-counter or prescription non-steroidal anti-inflammatory drugs (NSAIDs). These may be the first medicines used for pain. They work well for muscle and bone pain, and they reduce swelling. Acetaminophen. This over-the-counter medicine may work well for milder pain but not swelling. Antidepressants. These may be used to treat chronic pain. A certain type of antidepressant (tricyclics) is often used. These medicines are given in lower doses for pain than when used for depression. Anticonvulsants. These are usually used to treat seizures but may also reduce nerve (neuropathic) pain. Muscle relaxants. These relieve pain caused by sudden muscle tightening (spasms). You may also use a pain medicine that is  applied to the skin as a patch, cream, or gel (topical analgesic), such as a numbing medicine. These may cause fewer side effects than medicines taken by mouth. Do certain therapies as directed Some therapies can help with pain management. They include: Physical therapy. You will do exercises to gain strength and flexibility. A physical therapist may teach you exercises to move and stretch parts of your body that are weak, stiff, or painful. You can learn these exercises at physical therapy visits and practice them at home. Physical therapy may also involve: Massage. Heat wraps or applying heat or cold to affected areas. Electrical signals that interrupt pain signals (transcutaneous electrical nerve stimulation, TENS). Weak lasers that reduce pain and swelling (low-level laser therapy). Signals from your body that help you learn to regulate pain (biofeedback). Occupational therapy. This helps you to learn ways to function at home and work with less pain. Recreational therapy. This involves trying new activities or hobbies, such as a physical activity or drawing. Mental health therapy, including: Cognitive behavioral therapy (CBT). This helps you learn coping skills for dealing with pain. Acceptance and commitment therapy (ACT) to change the way you think and react to pain. Relaxation therapies, including muscle relaxation exercises and mindfulness-based stress reduction. Pain management counseling. This may be individual, family, or group counseling.  Receive medical treatments Medical treatments for pain management include: Nerve block injections. These may include a pain blocker and anti-inflammatory medicines. You may have injections: Near the spine to relieve chronic back or neck pain. Into joints to relieve back or joint pain. Into nerve areas that supply a painful area to relieve body pain. Into muscles (trigger point injections) to relieve  some painful muscle conditions. A medical  device placed near your spine to help block pain signals and relieve nerve pain or chronic back pain (spinal cord stimulation device). Acupuncture. Follow these instructions at home Medicines Take over-the-counter and prescription medicines only as told by your health care provider. If you are taking pain medicine, ask your health care providers about possible side effects to watch out for. Do not drive or use heavy machinery while taking prescription opioid pain medicine. Lifestyle  Do not use drugs or alcohol to reduce pain. If you drink alcohol, limit how much you have to: 0-1 drink a day for women who are not pregnant. 0-2 drinks a day for men. Know how much alcohol is in a drink. In the [place] one drink equals one 12 oz bottle of beer (355 mL), one 5 oz glass of wine (148 mL), or one 1 oz glass of hard liquor (44 mL). Do not use any products that contain nicotine or tobacco. These products include cigarettes, chewing tobacco, and vaping devices, such as e-cigarettes. If you need help quitting, ask your health care provider. Eat a healthy diet and maintain a healthy weight. Poor diet and excess weight may make pain worse. Eat foods that are high in fiber. These include fresh fruits and vegetables, whole grains, and beans. Limit foods that are high in fat and processed sugars, such as fried and sweet foods. Exercise regularly. Exercise lowers stress and may help relieve pain. Ask your health care provider what activities and exercises are safe for you. If your health care provider approves, join an exercise class that combines movement and stress reduction. Examples include yoga and tai chi. Get enough sleep. Lack of sleep may make pain worse. Lower stress as much as possible. Practice stress reduction techniques as told by your therapist. General instructions Work with all your pain management providers to find the treatments that work best for you. You are an important member of your pain  management team. There are many things you can do to reduce pain on your own. Consider joining an online or in-person support group for people who have chronic pain. Keep all follow-up visits. This is important. Where to find more information You can find more information about managing pain without opioids from: American Academy of Pain Medicine: painmed.org Institute for Chronic Pain: instituteforchronicpain.org American Chronic Pain Association: theacpa.org Contact a health care provider if: You have side effects from pain medicine. Your pain gets worse or does not get better with treatments or home therapy. You are struggling with anxiety or depression. Summary Many types of pain can be managed without opioids. Chronic pain may respond better to pain management without opioids. Pain is best managed when you and a team of health care providers work together. Pain management without opioids may include non-opioid medicines, medical treatments, physical therapy, mental health therapy, and lifestyle changes. Tell your health care providers if your pain gets worse or is not being managed well enough. This information is not intended to replace advice given to you by your health care provider. Make sure you discuss any questions you have with your health care provider. Document Revised: 08/07/2020 Document Reviewed: 08/07/2020 Elsevier Patient Education  2023 Elsevier Inc.   Goals      Exercise 3x per week (30 min per time)     Continue to exercise and stay healthy.     Patient Stated     Patient states her goal is to continue to stay active and as  healthy as possible         This is a list of the screening recommended for you and due dates:  Health Maintenance  Topic Date Due   COVID-19 Vaccine (4 - 2023-24 season) 01/09/2022   Flu Shot  12/10/2022   Medicare Annual Wellness Visit  09/18/2023   DTaP/Tdap/Td vaccine (2 - Tdap) 09/10/2024   Colon Cancer Screening  09/10/2024    Pneumonia Vaccine  Completed   DEXA scan (bone density measurement)  Completed   Hepatitis C Screening: USPSTF Recommendation to screen - Ages 11-79 yo.  Completed   Zoster (Shingles) Vaccine  Completed   HPV Vaccine  Aged Out    Advanced directives: patient will provide the office with a copy  Conditions/risks identified: discussed  Next appointment: Follow up in one year for your annual wellness visit 09/24/2022 @ 2:30pm via telephone   Preventive Care 65 Years and Older, Female Preventive care refers to lifestyle choices and visits with your health care provider that can promote health and wellness. What does preventive care include? A yearly physical exam. This is also called an annual well check. Dental exams once or twice a year. Routine eye exams. Ask your health care provider how often you should have your eyes checked. Personal lifestyle choices, including: Daily care of your teeth and gums. Regular physical activity. Eating a healthy diet. Avoiding tobacco and drug use. Limiting alcohol use. Practicing safe sex. Taking low-dose aspirin every day. Taking vitamin and mineral supplements as recommended by your health care provider. What happens during an annual well check? The services and screenings done by your health care provider during your annual well check will depend on your age, overall health, lifestyle risk factors, and family history of disease. Counseling  Your health care provider may ask you questions about your: Alcohol use. Tobacco use. Drug use. Emotional well-being. Home and relationship well-being. Sexual activity. Eating habits. History of falls. Memory and ability to understand (cognition). Work and work Astronomer. Reproductive health. Screening  You may have the following tests or measurements: Height, weight, and BMI. Blood pressure. Lipid and cholesterol levels. These may be checked every 5 years, or more frequently if you are over 60  years old. Skin check. Lung cancer screening. You may have this screening every year starting at age 9 if you have a 30-pack-year history of smoking and currently smoke or have quit within the past 15 years. Fecal occult blood test (FOBT) of the stool. You may have this test every year starting at age 52. Flexible sigmoidoscopy or colonoscopy. You may have a sigmoidoscopy every 5 years or a colonoscopy every 10 years starting at age 66. Hepatitis C blood test. Hepatitis B blood test. Sexually transmitted disease (STD) testing. Diabetes screening. This is done by checking your blood sugar (glucose) after you have not eaten for a while (fasting). You may have this done every 1-3 years. Bone density scan. This is done to screen for osteoporosis. You may have this done starting at age 71. Mammogram. This may be done every 1-2 years. Talk to your health care provider about how often you should have regular mammograms. Talk with your health care provider about your test results, treatment options, and if necessary, the need for more tests. Vaccines  Your health care provider may recommend certain vaccines, such as: Influenza vaccine. This is recommended every year. Tetanus, diphtheria, and acellular pertussis (Tdap, Td) vaccine. You may need a Td booster every 10 years. Zoster vaccine. You may need  this after age 76. Pneumococcal 13-valent conjugate (PCV13) vaccine. One dose is recommended after age 25. Pneumococcal polysaccharide (PPSV23) vaccine. One dose is recommended after age 7. Talk to your health care provider about which screenings and vaccines you need and how often you need them. This information is not intended to replace advice given to you by your health care provider. Make sure you discuss any questions you have with your health care provider. Document Released: 05/24/2015 Document Revised: 01/15/2016 Document Reviewed: 02/26/2015 Elsevier Interactive Patient Education  2017  [employer].  Fall Prevention in the Home Falls can cause injuries. They can happen to people of all ages. There are many things you can do to make your home safe and to help prevent falls. What can I do on the outside of my home? Regularly fix the edges of walkways and driveways and fix any cracks. Remove anything that might make you trip as you walk through a door, such as a raised step or threshold. Trim any bushes or trees on the path to your home. Use bright outdoor lighting. Clear any walking paths of anything that might make someone trip, such as rocks or tools. Regularly check to see if handrails are loose or broken. Make sure that both sides of any steps have handrails. Any raised decks and porches should have guardrails on the edges. Have any leaves, snow, or ice cleared regularly. Use sand or salt on walking paths during winter. Clean up any spills in your garage right away. This includes oil or grease spills. What can I do in the bathroom? Use night lights. Install grab bars by the toilet and in the tub and shower. Do not use towel bars as grab bars. Use non-skid mats or decals in the tub or shower. If you need to sit down in the shower, use a plastic, non-slip stool. Keep the floor dry. Clean up any water that spills on the floor as soon as it happens. Remove soap buildup in the tub or shower regularly. Attach bath mats securely with double-sided non-slip rug tape. Do not have throw rugs and other things on the floor that can make you trip. What can I do in the bedroom? Use night lights. Make sure that you have a light by your bed that is easy to reach. Do not use any sheets or blankets that are too big for your bed. They should not hang down onto the floor. Have a firm chair that has side arms. You can use this for support while you get dressed. Do not have throw rugs and other things on the floor that can make you trip. What can I do in the kitchen? Clean up any spills  right away. Avoid walking on wet floors. Keep items that you use a lot in easy-to-reach places. If you need to reach something above you, use a strong step stool that has a grab bar. Keep electrical cords out of the way. Do not use floor polish or wax that makes floors slippery. If you must use wax, use non-skid floor wax. Do not have throw rugs and other things on the floor that can make you trip. What can I do with my stairs? Do not leave any items on the stairs. Make sure that there are handrails on both sides of the stairs and use them. Fix handrails that are broken or loose. Make sure that handrails are as long as the stairways. Check any carpeting to make sure that it is firmly attached to  the stairs. Fix any carpet that is loose or worn. Avoid having throw rugs at the top or bottom of the stairs. If you do have throw rugs, attach them to the floor with carpet tape. Make sure that you have a light switch at the top of the stairs and the bottom of the stairs. If you do not have them, ask someone to add them for you. What else can I do to help prevent falls? Wear shoes that: Do not have high heels. Have rubber bottoms. Are comfortable and fit you well. Are closed at the toe. Do not wear sandals. If you use a stepladder: Make sure that it is fully opened. Do not climb a closed stepladder. Make sure that both sides of the stepladder are locked into place. Ask someone to hold it for you, if possible. Clearly mark and make sure that you can see: Any grab bars or handrails. First and last steps. Where the edge of each step is. Use tools that help you move around (mobility aids) if they are needed. These include: Canes. Walkers. Scooters. Crutches. Turn on the lights when you go into a dark area. Replace any light bulbs as soon as they burn out. Set up your furniture so you have a clear path. Avoid moving your furniture around. If any of your floors are uneven, fix them. If there are  any pets around you, be aware of where they are. Review your medicines with your doctor. Some medicines can make you feel dizzy. This can increase your chance of falling. Ask your doctor what other things that you can do to help prevent falls. This information is not intended to replace advice given to you by your health care provider. Make sure you discuss any questions you have with your health care provider. Document Released: 02/21/2009 Document Revised: 10/03/2015 Document Reviewed: 06/01/2014 Elsevier Interactive Patient Education  2017 [employer].

## 2022-09-23 ENCOUNTER — Ambulatory Visit: Payer: Medicare Other | Admitting: Student

## 2022-09-23 ENCOUNTER — Encounter: Payer: Self-pay | Admitting: Student

## 2022-09-23 VITALS — BP 118/62 | HR 66 | Ht 62.0 in | Wt 104.8 lb

## 2022-09-23 DIAGNOSIS — I480 Paroxysmal atrial fibrillation: Secondary | ICD-10-CM | POA: Diagnosis not present

## 2022-09-23 DIAGNOSIS — R079 Chest pain, unspecified: Secondary | ICD-10-CM

## 2022-09-23 DIAGNOSIS — I951 Orthostatic hypotension: Secondary | ICD-10-CM

## 2022-09-23 DIAGNOSIS — E785 Hyperlipidemia, unspecified: Secondary | ICD-10-CM

## 2022-09-23 NOTE — Progress Notes (Signed)
Cardiology Office Note    Date:  09/23/2022  ID:  Navya, Nelton 04/02/1941, MRN [id number] Cardiologist: Dina Rich, MD    History of Present Illness:    [name] is a 82 y.o. female with past medical history of palpitations (monitor in 08/2018 showing PAC's, PVC's and atrial tachycardia), remote history of atrial fibrillation (on Flecainide), orthostatic hypotension, anemia and HLD who presents to the office today for 1 month follow-up.  She was examined by myself in 08/2022 and had recently called the office reporting episodes of elevated BP and Midodrine had been discontinued. She reported still feeling "off" at the time of her office visit and reported having a funny feeling in her chest when BP was elevated. Her husband had recently passed away as well and we reviewed that stress could be contributing.  BP was at 152/82 during her office visit and it was recommended to remain off Midodrine and she was provided with a BP log. She was continued on Flecainide 100 mg twice daily, Eliquis 5 mg twice daily (with plans to reduce dosing to 2.5 mg twice daily when she turns 82 in 02/2023) and Crestor 40mg  daily. Given her recent episodes of atypical chest pain in setting of elevated BP, a follow-up echocardiogram was recommended to assess for any structural abnormalities.  In talking with the patient today, she reports her blood pressure has been extremely variable when checked at home as SBP can range from the 110's to 170's. She reports "feeling drunk" at times but is unsure of her BP when this occurs. She denies any specific exertional chest pain or progressive dyspnea. No specific orthopnea, PND or pitting edema. She did bring her home blood pressure cuff with her today and this read her BP as being elevated at 141/62, rechecked manually with our cuff and at 126/52. She reports her current blood pressure cuff is 6+ years old.  Studies Reviewed:   EKG: EKG is not ordered today. EKG  from 07/13/2022 is reviewed and shows NSR, HR 61 with known RBBB.   NST: 08/2017 There was no ST segment deviation noted during stress. The study is normal. There are no perfusion defects consistent with prior infarct or current ischemia. This is a low risk study. The left ventricular ejection fraction is normal (55-65%).  Event Monitor: 08/2018 48 hr holter monitor Min HR 50, Max HR 103, Avg HR 68 Rare ventricular ectopy, 2 isolated PVCs Rare supraventricular ectopy. Primarily PACs. Three short runs of atach, longest 4 beats. Reported symptoms correlated with sinus rhythm    Physical Exam:   VS:  BP 118/62   Pulse 66   Ht 5\' 2"  (1.575 m)   Wt 104 lb 12.8 oz (47.5 kg)   SpO2 100%   BMI 19.17 kg/m    Wt Readings from Last 3 Encounters:  09/23/22 104 lb 12.8 oz (47.5 kg)  09/18/22 102 lb (46.3 kg)  08/27/22 101 lb (45.8 kg)     GEN: Pleasant, thin female appearing in no acute distress NECK: No JVD; No carotid bruits CARDIAC: RRR, no murmurs, rubs, gallops RESPIRATORY:  Clear to auscultation without rales, wheezing or rhonchi  ABDOMEN: Appears non-distended. No obvious abdominal masses. EXTREMITIES: No clubbing or cyanosis. No pitting edema. Distal pedal pulses are 2+ bilaterally.   Assessment and Plan:   1. History of Orthostatic Hypotension - She is no longer on Midodrine due to recent episodes of elevated BP and given her well-controlled BP at 118/62 during today's visit,  I would not restart this. Would also be hesitant to start medications for hypertension given her variable readings. Given the discrepancy between her home blood pressure cuff and manual readings, she is planning to purchase a new blood pressure machine. In regards to her "feeling drunk",  we reviewed that this could possibly be due to the use of Hydrocodone as she takes this TID but she says she would not be able to function without this medication due to significant back pain. Will defer to her PCP in  regards to seeing if she is a candidate for other medication options.   2. Paroxysmal Atrial Fibrillation - She denies any recent palpitations and her heart rate is well-controlled in the 60's today. She remains on Flecainide 100 mg twice daily for rhythm control. - No reports of active bleeding. Remains on Eliquis 5 mg twice daily for anticoagulation which is the correct dose given her age, weight and renal function. This will need to be reduced to 2.5 mg twice daily when she turns 82 yo.   3. HLD - Her LDL was at 79 when checked in 07/2022. Continue current medical therapy with Crestor 40 mg daily.  4. Atypical Chest Pain - She denies any recurrent symptoms. An echocardiogram is pending to assess for any structural abnormalities and we will follow-up on results once available.  Signed, Ellsworth Lennox, PA-C

## 2022-09-23 NOTE — Patient Instructions (Signed)
Medication Instructions:  Your physician recommends that you continue on your current medications as directed. Please refer to the Current Medication list given to you today.  Please monitor your Blood Pressure and drop off log in 3-4 weeks.   *If you need a refill on your cardiac medications before your next appointment, please call your pharmacy*   Lab Work: NONE   If you have labs (blood work) drawn today and your tests are completely normal, you will receive your results only by: MyChart Message (if you have MyChart) OR A paper copy in the mail If you have any lab test that is abnormal or we need to change your treatment, we will call you to review the results.   Testing/Procedures: NONE    Follow-Up: At [facility], you and your health needs are our priority.  As part of our continuing mission to provide you with exceptional heart care, we have created designated Provider Care Teams.  These Care Teams include your primary Cardiologist (physician) and Advanced Practice Providers (APPs -  Physician Assistants and Nurse Practitioners) who all work together to provide you with the care you need, when you need it.  We recommend signing up for the patient portal called "MyChart".  Sign up information is provided on this After Visit Summary.  MyChart is used to connect with patients for Virtual Visits (Telemedicine).  Patients are able to view lab/test results, encounter notes, upcoming appointments, etc.  Non-urgent messages can be sent to your provider as well.   To learn more about what you can do with MyChart, go to [email or web address].    Your next appointment:   5 month(s)  Provider:   Dina Rich, MD    Other Instructions Thank you for choosing [facility]!

## 2022-09-24 ENCOUNTER — Ambulatory Visit (HOSPITAL_COMMUNITY)
Admission: RE | Admit: 2022-09-24 | Discharge: 2022-09-24 | Disposition: A | Discharge status: Home / Self Care | Payer: Medicare Other | Source: Ambulatory Visit | Attending: Student | Admitting: Student

## 2022-09-24 DIAGNOSIS — E785 Hyperlipidemia, unspecified: Secondary | ICD-10-CM | POA: Insufficient documentation

## 2022-09-24 DIAGNOSIS — R079 Chest pain, unspecified: Secondary | ICD-10-CM | POA: Diagnosis not present

## 2022-09-24 DIAGNOSIS — I951 Orthostatic hypotension: Secondary | ICD-10-CM | POA: Insufficient documentation

## 2022-09-24 DIAGNOSIS — I480 Paroxysmal atrial fibrillation: Secondary | ICD-10-CM | POA: Diagnosis not present

## 2022-09-24 LAB — ECHOCARDIOGRAM COMPLETE
Area-P 1/2: 4.49 cm2
S' Lateral: 2.9 cm

## 2022-09-24 NOTE — Progress Notes (Signed)
*  PRELIMINARY RESULTS* Echocardiogram 2D Echocardiogram has been performed.  Stacey Drain 09/24/2022, 2:37 PM

## 2022-09-25 ENCOUNTER — Ambulatory Visit: Payer: Medicare Other | Admitting: Cardiology

## 2022-10-09 ENCOUNTER — Telehealth: Payer: Self-pay

## 2022-10-09 NOTE — Telephone Encounter (Signed)
[name] was wanting to see if Dr Lorin Picket can call her in lactulose to [facility]   [name]

## 2022-10-10 ENCOUNTER — Other Ambulatory Visit: Payer: Self-pay | Admitting: Family Medicine

## 2022-10-10 MED ORDER — LACTULOSE 10 GM/15ML PO SOLN: 20.0000 g | Freq: Every day | ORAL | 3 refills | Status: DC

## 2022-10-12 NOTE — Telephone Encounter (Signed)
Cook, Jayce G, DO     Rx sent.   

## 2022-10-16 ENCOUNTER — Other Ambulatory Visit: Payer: Self-pay | Admitting: Family Medicine

## 2022-10-17 ENCOUNTER — Other Ambulatory Visit: Payer: Self-pay | Admitting: Family Medicine

## 2022-10-17 NOTE — Telephone Encounter (Signed)
Front Patient requested her pain medicine I gave her a 30-day refill Because she is on pain medicine she needs to do an office visit with [place] for pain management every 3 months it has been greater than 3 months She needs to have a pain management office visit with me within the next 30 days please

## 2022-11-16 ENCOUNTER — Other Ambulatory Visit: Payer: Self-pay | Admitting: Family Medicine

## 2022-11-17 ENCOUNTER — Telehealth: Payer: Self-pay

## 2022-11-17 NOTE — Telephone Encounter (Signed)
Pt has appt next Wed and wants to know if she needs to go and have blood work done. If so this needs to be ordered please call pt to inform when completed.  Cheryln Manly -[phone] leave message

## 2022-11-18 NOTE — Telephone Encounter (Signed)
Spoke with patient and told her she did not need blood work at this time.

## 2022-11-18 NOTE — Telephone Encounter (Signed)
So on this particular visit she does not have to do any additional blood work We will at her office visit be discussing her overall health, updating her medications, and ordering lab work to be done in the fall before her follow-up visits with [place] at that time thank you

## 2022-11-25 ENCOUNTER — Ambulatory Visit (INDEPENDENT_AMBULATORY_CARE_PROVIDER_SITE_OTHER): Payer: Medicare Other | Admitting: Family Medicine

## 2022-11-25 VITALS — BP 121/58 | HR 66 | Wt 104.4 lb

## 2022-11-25 DIAGNOSIS — I951 Orthostatic hypotension: Secondary | ICD-10-CM

## 2022-11-25 DIAGNOSIS — G8929 Other chronic pain: Secondary | ICD-10-CM

## 2022-11-25 DIAGNOSIS — N644 Mastodynia: Secondary | ICD-10-CM

## 2022-11-25 DIAGNOSIS — R9230 Dense breasts, unspecified: Secondary | ICD-10-CM

## 2022-11-25 DIAGNOSIS — G894 Chronic pain syndrome: Secondary | ICD-10-CM | POA: Diagnosis not present

## 2022-11-25 DIAGNOSIS — M5441 Lumbago with sciatica, right side: Secondary | ICD-10-CM

## 2022-11-25 DIAGNOSIS — E7849 Other hyperlipidemia: Secondary | ICD-10-CM

## 2022-11-25 MED ORDER — LACTULOSE 10 GM/15ML PO SOLN: 20.0000 g | Freq: Every day | ORAL | 3 refills | Status: DC

## 2022-11-25 NOTE — Progress Notes (Signed)
Subjective:    Patient ID: [name], female    DOB: November 24, 1940, 82 y.o.   MRN: [id number] HPI This patient was seen today for chronic pain  The medication list was reviewed and updated.   Location of Pain for which the patient has been treated with regarding narcotics: Lumbar pain radiating down the right leg sometimes down the left leg  Onset of this pain: Present for years   -Compliance with medication: Good compliance with the medicine  - Number patient states they take daily: Recently she states she has been taking 4/day over the past couple weeks because of the increased severe pain in her back  -Reason for ongoing use of opioids ongoing low back pain discomfort.  Denies new injury.  What other measures have been tried outside of opioids Tylenol, stretches, physical therapy, surgery, consultation with back specialist  In the ongoing specialists regarding this condition lumbar specialist neurosurgeon  -when was the last dose patient took? Noon today   The patient was advised the importance of maintaining medication and not using illegal substances with these.  Here for refills and follow up  The patient was educated that we can provide 3 monthly scripts for their medication, it is their responsibility to follow the instructions.  Side effects or complications from medications: None  Patient is aware that pain medications are meant to minimize the severity of the pain to allow their pain levels to improve to allow for better function. They are aware of that pain medications cannot totally remove their pain.  Due for UDT ( at least once per year) (pain management contract is also completed at the time of the UDT): On next visit  Scale of 1 to 10 ( 1 is least 10 is most) Your pain level without the medicine: 9 Your pain level with medication 4  Scale 1 to 10 ( 1-helps very little, 10 helps very well) How well does your pain medication reduce your pain so you can  function better through out the day? 8  Quality of the pain: Throbbing aching  Persistence of the pain: Present all the time  Modifying factors: Worse with activity  Chronic pain syndrome  Other hyperlipidemia  Chronic right-sided low back pain with right-sided sciatica  Orthostasis  Dense breast tissue on mammogram, unspecified type  Breast pain, right  Patient is being evaluated by cardiology because at times her blood pressure does drop when I checked her blood pressure sitting and standing it does drop some but not dramatically. She also relates had several days of right breast pain she was concerned by the letter that was sent to her after her mammogram that talked about dense breast she voices understanding after we discussed it.  She states she has not had breast pain for several weeks and she states that she has not noticed any changes         Review of Systems     Objective:   Physical Exam General-in no acute distress Eyes-no discharge Lungs-respiratory rate normal, CTA CV-no murmurs,RRR Extremities skin warm dry no edema Neuro grossly normal Behavior normal, alert  Blood pressure is running 114/70 standing 102/68       Assessment & Plan:  1. Chronic pain syndrome The patient was seen in followup for chronic pain. A review over at their current pain status was discussed. Drug registry was checked. Prescriptions were given.  Regular follow-up recommended. Discussion was held regarding the importance of compliance with medication as well as  pain medication contract.  Patient was informed that medication may cause drowsiness and should not be combined  with other medications/alcohol or street drugs. If the patient feels medication is causing altered alertness then do not drive or operate dangerous equipment.  Should be noted that the patient appears to be meeting appropriate use of opioids and response.  Evidenced by improved function and decent pain  control without significant side effects and no evidence of overt aberrancy issues.  Upon discussion with the patient today they understand that opioid therapy is optional and they feel that the pain has been refractory to reasonable conservative measures and is significant and affecting quality of life enough to warrant ongoing therapy and wishes to continue opioids.  Refills were provided.  [facility] medical Board guidelines regarding the pain medicine has been reviewed.  CDC guidelines most updated 2022 has been reviewed by the prescriber.  PDMP is checked on a regular basis yearly urine drug screen and pain management contract  Pain medication no longer helping as much  for her as it was previously.  She relates recently she has been taking 4 tablets/day that is better she states the pain medicine only last about 6 hours we will go ahead and go up to 4 tablets a day she states it does not make her drowsy.  That is the top dose that we can utilize.  Urine drug screen on next visit  2. Other hyperlipidemia Continue statin check labs before next visit  3. Chronic right-sided low back pain with right-sided sciatica Gentle activity stretching recommended.  Pain medicine as needed no more than 4/day  4. Orthostasis Patient to be careful with standing readings are not bad enough to warrant additional medicines  5. Dense breast tissue on mammogram, unspecified type Patient was concerned about this letter she received after her mammogram patient was told that if she started having significant problems with her breast we could do an MRI but currently that would not be covered by her insurance she does not have any strong family history of breast cancer  6. Breast pain, right She had a few days of breast pain several months ago and has not had any since has not noticed any changes  Complex ovarian cyst stable repeat ultrasound at the end of this year

## 2022-11-26 ENCOUNTER — Telehealth: Payer: Self-pay | Admitting: Student

## 2022-11-26 ENCOUNTER — Encounter: Payer: Self-pay | Admitting: Student

## 2022-11-26 NOTE — Telephone Encounter (Signed)
    Please let the patient know that I reviewed her detailed blood pressure log and I appreciate her keeping track of this. Her blood pressure has overall been well-controlled with no significant hypertension. Given episodes of her systolic readings in the 90's, I would not recommend starting something for blood pressure at this time. She also does not require the use of Midodrine at this time as BP previously became significantly elevated with this.   Signed, Ellsworth Lennox, PA-C 11/26/2022, 12:42 PM

## 2022-11-26 NOTE — Telephone Encounter (Signed)
Left message to return call 

## 2022-11-27 NOTE — Telephone Encounter (Signed)
BP log discussed with patient and also discussed B.Strader's message.patient will continue to monitor BP

## 2022-12-18 ENCOUNTER — Other Ambulatory Visit: Payer: Self-pay | Admitting: Family Medicine

## 2022-12-19 ENCOUNTER — Other Ambulatory Visit: Payer: Self-pay | Admitting: Family Medicine

## 2022-12-19 MED ORDER — HYDROCODONE-ACETAMINOPHEN 10-325 MG PO TABS: ORAL_TABLET | ORAL | 0 refills | Status: DC

## 2023-02-03 ENCOUNTER — Other Ambulatory Visit: Payer: Self-pay | Admitting: Cardiology

## 2023-02-03 NOTE — Telephone Encounter (Signed)
Prescription refill request for Eliquis received. Indication: PAF Last office visit: 09/23/22  B Strader PA-C Scr: 0.85 on 07/13/22 Age: 82 Weight: 47.5kg  Based on above findings Eliquis 5mg  twice daily is the appropriate dose.  Will need to decrease dose next month when patient turns 82.  Refill approved x 1.

## 2023-02-12 ENCOUNTER — Telehealth: Payer: Self-pay | Admitting: Family Medicine

## 2023-02-12 NOTE — Telephone Encounter (Signed)
Patient has appointment 10/22 and wanting to know if she needs labs done

## 2023-02-13 NOTE — Telephone Encounter (Signed)
Lipid, liver, metabolic 7, CBC, Z61, folate  Macrocytic anemia, hyperlipidemia, high risk medication

## 2023-02-15 ENCOUNTER — Other Ambulatory Visit: Payer: Self-pay

## 2023-02-15 DIAGNOSIS — Z79899 Other long term (current) drug therapy: Secondary | ICD-10-CM

## 2023-02-15 DIAGNOSIS — I10 Essential (primary) hypertension: Secondary | ICD-10-CM

## 2023-02-15 DIAGNOSIS — D539 Nutritional anemia, unspecified: Secondary | ICD-10-CM

## 2023-02-15 DIAGNOSIS — E7849 Other hyperlipidemia: Secondary | ICD-10-CM

## 2023-02-15 NOTE — Telephone Encounter (Signed)
Message left to inform patient about labs being ordered.

## 2023-02-23 DIAGNOSIS — I10 Essential (primary) hypertension: Secondary | ICD-10-CM | POA: Diagnosis not present

## 2023-02-23 DIAGNOSIS — E7849 Other hyperlipidemia: Secondary | ICD-10-CM | POA: Diagnosis not present

## 2023-02-23 DIAGNOSIS — D539 Nutritional anemia, unspecified: Secondary | ICD-10-CM | POA: Diagnosis not present

## 2023-02-23 DIAGNOSIS — Z79899 Other long term (current) drug therapy: Secondary | ICD-10-CM | POA: Diagnosis not present

## 2023-02-24 LAB — CBC WITH DIFFERENTIAL/PLATELET
Basophils Absolute: 0 10*3/uL (ref 0.0–0.2)
Basos: 0 %
EOS (ABSOLUTE): 0.5 10*3/uL — ABNORMAL HIGH (ref 0.0–0.4)
Eos: 6 %
Hematocrit: 36.1 % (ref 34.0–46.6)
Hemoglobin: 11.5 g/dL (ref 11.1–15.9)
Immature Grans (Abs): 0 10*3/uL (ref 0.0–0.1)
Immature Granulocytes: 0 %
Lymphocytes Absolute: 3.2 10*3/uL — ABNORMAL HIGH (ref 0.7–3.1)
Lymphs: 39 %
MCH: 33 pg (ref 26.6–33.0)
MCHC: 31.9 g/dL (ref 31.5–35.7)
MCV: 103 fL — ABNORMAL HIGH (ref 79–97)
Monocytes Absolute: 0.6 10*3/uL (ref 0.1–0.9)
Monocytes: 7 %
Neutrophils Absolute: 4 10*3/uL (ref 1.4–7.0)
Neutrophils: 48 %
Platelets: 302 10*3/uL (ref 150–450)
RBC: 3.49 x10E6/uL — ABNORMAL LOW (ref 3.77–5.28)
RDW: 11.6 % — ABNORMAL LOW (ref 11.7–15.4)
WBC: 8.3 10*3/uL (ref 3.4–10.8)

## 2023-02-24 LAB — HEPATIC FUNCTION PANEL
ALT: 14 [IU]/L (ref 0–32)
AST: 17 [IU]/L (ref 0–40)
Albumin: 4.3 g/dL (ref 3.8–4.8)
Alkaline Phosphatase: 50 [IU]/L (ref 44–121)
Bilirubin Total: 0.3 mg/dL (ref 0.0–1.2)
Bilirubin, Direct: <0.1 mg/dL (ref 0.00–0.40)
Total Protein: 6.5 g/dL (ref 6.0–8.5)

## 2023-02-24 LAB — LIPID PANEL
Chol/HDL Ratio: 2.6 {ratio} (ref 0.0–4.4)
Cholesterol, Total: 163 mg/dL (ref 100–199)
HDL: 63 mg/dL (ref >39)
LDL Chol Calc (NIH): 82 mg/dL (ref 0–99)
Triglycerides: 98 mg/dL (ref 0–149)
VLDL Cholesterol Cal: 18 mg/dL (ref 5–40)

## 2023-02-24 LAB — BASIC METABOLIC PANEL
BUN/Creatinine Ratio: 21 (ref 12–28)
BUN: 20 mg/dL (ref 8–27)
CO2: 24 mmol/L (ref 20–29)
Calcium: 9.8 mg/dL (ref 8.7–10.3)
Chloride: 108 mmol/L — ABNORMAL HIGH (ref 96–106)
Creatinine, Ser: 0.97 mg/dL (ref 0.57–1.00)
Glucose: 89 mg/dL (ref 70–99)
Potassium: 4.5 mmol/L (ref 3.5–5.2)
Sodium: 146 mmol/L — ABNORMAL HIGH (ref 134–144)
eGFR: 59 mL/min/{1.73_m2} — ABNORMAL LOW (ref >59)

## 2023-02-24 LAB — VITAMIN B12: Vitamin B-12: >2000 pg/mL — ABNORMAL HIGH (ref 232–1245)

## 2023-02-24 LAB — FOLATE: Folate: 8.6 ng/mL (ref >3.0)

## 2023-03-01 ENCOUNTER — Encounter: Payer: Self-pay | Admitting: Gastroenterology

## 2023-03-01 ENCOUNTER — Ambulatory Visit: Payer: Medicare Other | Admitting: Gastroenterology

## 2023-03-01 VITALS — BP 95/58 | HR 62 | Temp 97.6°F | Ht 62.0 in | Wt 105.2 lb

## 2023-03-01 DIAGNOSIS — D509 Iron deficiency anemia, unspecified: Secondary | ICD-10-CM

## 2023-03-01 DIAGNOSIS — Z85038 Personal history of other malignant neoplasm of large intestine: Secondary | ICD-10-CM

## 2023-03-01 DIAGNOSIS — Z87891 Personal history of nicotine dependence: Secondary | ICD-10-CM

## 2023-03-01 DIAGNOSIS — Z860101 Personal history of adenomatous and serrated colon polyps: Secondary | ICD-10-CM | POA: Diagnosis not present

## 2023-03-01 DIAGNOSIS — K5900 Constipation, unspecified: Secondary | ICD-10-CM

## 2023-03-01 DIAGNOSIS — Z8719 Personal history of other diseases of the digestive system: Secondary | ICD-10-CM

## 2023-03-01 NOTE — Progress Notes (Signed)
GI Office Note    Referring Provider: Babs Sciara, MD Primary Care Physician:  Babs Sciara, MD Primary Gastroenterologist: Dolores Frame, MD   Date:  03/01/2023  ID:  [name], DOB 08/17/1940, MRN [id number]   Chief Complaint   Chief Complaint  Patient presents with   Follow-up    Doing well, no issues   History of Present Illness  [name] is a 82 y.o. female with a history of colon cancer s/p resection in 2006, GERD, gastroparesis, HLD, A-fib, and tobacco abuse presenting today for follow up.   EGD February 2020: -Normal esophagus -2 cm hiatal hernia -3 gastric polyps s/p biopsy -Normal antral mucosa s/p biopsy -Path: Negative for H. pylori.  Capsule endoscopy August 2021: Incomplete as capsule did not reach cecum.  2 small jejunal diverticula without stigmata of bleeding, 2 areas of minus.  Advised CT enterography.   Colonoscopy May 2023: -3 (4 to 6 mm) polyps in the sigmoid and transverse colon -Patent end-to-side colocolonic anastomosis with healthy-appearing tissue -Polyps consistent with tubular adenoma x 3 fragments -Repeat colonoscopy in 3 years for surveillance   Office visit 02/17/2022.  Taking iron daily.  Denied any melena, BRBPR, lack of appetite, early satiety, N/V, chest pain, shortness of breath, restless leg, pica, weight loss.  No upper GI complaints.  Having regular bowel movements with lactulose.  Having poor energy but likely secondary to taking care of her husband.  Usually gets about 3-4 hours of sleep nightly.  On midodrine which is controlling her blood pressure. Plan check CBC and iron panel, continue oral iron therapy and lactulose.  Last office visit 08/25/22. Constipation well-controlled with lactulose 15 mL daily.  Recently lost her husband 3 weeks prior.  Denied any upper or lower GI complaints.  Taking iron once daily.  Advised to continue current regimen.  Recheck CBC and iron panel.  Discussed if no improvement or  further decline then would need to repeat capsule endoscopy.      Latest Ref Rng & Units 02/23/2023    8:23 AM 08/25/2022    2:25 PM 05/18/2022    1:46 PM  CBC  WBC 3.4 - 10.8 x10E3/uL 8.3  7.7  7.6   Hemoglobin 11.1 - 15.9 g/dL 95.2  84.1  32.4   Hematocrit 34.0 - 46.6 % 36.1  34.3  31.7   Platelets 150 - 450 x10E3/uL 302  290  293     MCV           103  Today: Still having some BP issues with it running low. Previously was high and then was taken off the midodrine altogether d did okay for a little while. Goes to PCP this week (Tuesday), cardiology Wednesday. At times in the morning she can have some dizziness related to BP drop. No headaches. No chest pain or shortness of breath. Still taking lactulose 10g nightly. Has not been tacking it nightly - usually every other night. Still having daily BM without difficulty. Still tacking her iron supplement.   Currently doing some house renovation and staying busy. Has good days and bad days in regards to emotionally since her husband passed.   Current Outpatient Medications  Medication Sig Dispense Refill   alendronate (FOSAMAX) 70 MG tablet TAKE ONE TABLET (70 MG TOTAL) BY MOUTH EVERY 7 DAYS. TAKE WITH A FULL GLASS OF WATER ON AN EMPTY STOMACH. 4 tablet 5   apixaban (ELIQUIS) 5 MG TABS tablet TAKE ONE TABLET (5MG  TOTAL) BY  MOUTH TWOTIMES DAILY 30 tablet 0   AREXVY 120 MCG/0.5ML injection Inject 0.5 mLs into the muscle once.     Calcium Carbonate (CALCIUM 600 PO) Take 600 mg by mouth daily.     ferrous sulfate 325 (65 FE) MG tablet Take 325 mg by mouth daily with breakfast.     flecainide (TAMBOCOR) 100 MG tablet TAKE ONE TABLET (100MG  TOTAL) BY MOUTH EVERY 12 HOURS 60 tablet 11   HYDROcodone-acetaminophen (NORCO) 10-325 MG tablet 1 taken 3 times daily as needed for pain 90 tablet 0   lactulose (CHRONULAC) 10 GM/15ML solution Take 30 mLs (20 g total) by mouth at bedtime. 946 mL 3   omeprazole (PRILOSEC) 20 MG capsule Take 20 mg by mouth daily  before breakfast.      rosuvastatin (CRESTOR) 40 MG tablet TAKE ONE TABLET (40MG  TOTAL) BY MOUTH DAILY 90 tablet 3   vitamin B-12 (CYANOCOBALAMIN) 1000 MCG tablet Take 1,000 mcg by mouth daily.     No current facility-administered medications for this visit.    Past Medical History:  Diagnosis Date   Adenocarcinoma, colon (HCC) 03/2005   Stage I; resected in 03/2005   Arthritis    Colon cancer [facility])    Removed surgicaly    Dysrhythmia    Gastroesophageal reflux disease    Gastroparesis    Hearing loss    mild   Hyperlipidemia    Irregular heartbeat    Ovarian cyst 2008   Paroxysmal atrial fibrillation (HCC)    Palpitations; maintained on flecainide; -normal coronary angiography and 1999; negative stress nuclear study in 11/2005   PONV (postoperative nausea and vomiting)    Tobacco abuse, in remission    10-pack-years; quit in 2001    Past Surgical History:  Procedure Laterality Date   ABDOMINAL HYSTERECTOMY  1983   BACK SURGERY     BIOPSY  10/18/2017   Procedure: BIOPSY;  Surgeon: Malissa Hippo, MD;  [facility];  Service: Endoscopy;;  gastric   BIOPSY  07/06/2018   Procedure: BIOPSY;  Surgeon: Malissa Hippo, MD;  [facility];  Service: Endoscopy;;  gastric   CATARACT EXTRACTION W/PHACO Right 12/22/2021   Procedure: CATARACT EXTRACTION PHACO AND INTRAOCULAR LENS PLACEMENT (IOC);  Surgeon: Fabio Pierce, MD;  Location: [facility];  Service: Ophthalmology;  Laterality: Right;  CDE: 13.86   CATARACT EXTRACTION W/PHACO Left 01/05/2022   Procedure: CATARACT EXTRACTION PHACO AND INTRAOCULAR LENS PLACEMENT (IOC);  Surgeon: Fabio Pierce, MD;  Location: [facility];  Service: Ophthalmology;  Laterality: Left;  CDE 21.39   CHOLECYSTECTOMY     COLON SURGERY  2006   Colon cancer   COLONOSCOPY  03/25/2012   Malissa Hippo, MD   COLONOSCOPY N/A 04/26/2017   Procedure: COLONOSCOPY;  Surgeon: Malissa Hippo, MD;  [facility];  Service: Endoscopy;   Laterality: N/A;  2:00   COLONOSCOPY N/A 07/06/2018   Procedure: COLONOSCOPY;  Surgeon: Malissa Hippo, MD;  [facility];  Service: Endoscopy;  Laterality: N/A;  10:30   COLONOSCOPY WITH PROPOFOL N/A 09/10/2021   Procedure: COLONOSCOPY WITH PROPOFOL;  Surgeon: Malissa Hippo, MD;  [facility];  Service: Endoscopy;  Laterality: N/A;  910   COSMETIC SURGERY  09/2007   ESOPHAGOGASTRODUODENOSCOPY N/A 10/18/2017   Procedure: ESOPHAGOGASTRODUODENOSCOPY (EGD);  Surgeon: Malissa Hippo, MD;  [facility];  Service: Endoscopy;  Laterality: N/A;   ESOPHAGOGASTRODUODENOSCOPY N/A 07/06/2018   Procedure: ESOPHAGOGASTRODUODENOSCOPY (EGD);  Surgeon: Lionel December  U, MD;  [facility];  Service: Endoscopy;  Laterality: N/A;   GIVENS CAPSULE STUDY N/A 12/26/2019   Procedure: GIVENS CAPSULE STUDY;  Surgeon: Malissa Hippo, MD;  [facility];  Service: Endoscopy;  Laterality: N/A;  730   PARTIAL COLECTOMY  2006   adenocarcinoma   POLYPECTOMY  04/26/2017   Procedure: POLYPECTOMY;  Surgeon: Malissa Hippo, MD;  [facility];  Service: Endoscopy;;  colon   POLYPECTOMY  07/06/2018   Procedure: POLYPECTOMY;  Surgeon: Malissa Hippo, MD;  [facility];  Service: Endoscopy;;  colon    POLYPECTOMY  09/10/2021   Procedure: POLYPECTOMY;  Surgeon: Malissa Hippo, MD;  [facility];  Service: Endoscopy;;  transverse x2;sigmoid   TONSILLECTOMY      Family History  Problem Relation Age of Onset   Heart disease Mother    Stroke Mother    Heart attack Mother    Cancer Father        lung   Heart disease Father    Hypertension Father    Cancer Brother        rectal   Healthy Son    Heart failure Sister    Stroke Sister     Allergies as of 03/01/2023   (No Known Allergies)    Social History   Socioeconomic History   Marital status: Widowed    Spouse name: Not on file   Number of children: Not on file   Years of  education: Not on file   Highest education level: Not on file  Occupational History   Occupation: Catering manager  Tobacco Use   Smoking status: Former    Current packs/day: 0.00    Average packs/day: 0.3 packs/day for 40.0 years (12.0 ttl pk-yrs)    Types: Cigarettes    Start date: 02/23/1963    Quit date: 03/25/2002    Years since quitting: 20.9   Smokeless tobacco: Never   Tobacco comments:    Quit smoking in 2001  Vaping Use   Vaping status: Never Used  Substance and Sexual Activity   Alcohol use: No    Alcohol/week: 0.0 standard drinks of alcohol   Drug use: No   Sexual activity: Yes    Birth control/protection: Surgical  Other Topics Concern   Not on file  Social History Narrative   Oct 07, 2022: husband passed away ~ 5 weeks ago      Lives with husband in a one story home.  Has 3 children.  Retired from Warehouse manager work.  Education: some college.   Social Determinants of Health   Financial Resource Strain: Low Risk  (10-07-2022)   Overall Financial Resource Strain (CARDIA)    Difficulty of Paying Living Expenses: Not hard at all  Food Insecurity: No Food Insecurity (10-07-2022)   Hunger Vital Sign    Worried About Running Out of Food in the Last Year: Never true    Ran Out of Food in the Last Year: Never true  Transportation Needs: No Transportation Needs (10/07/2022)   PRAPARE - Administrator, Civil Service (Medical): No    Lack of Transportation (Non-Medical): No  Physical Activity: Sufficiently Active (10/07/2022)   Exercise Vital Sign    Days of Exercise per Week: 7 days    Minutes of Exercise per Session: 30 min  Stress: No Stress Concern Present (07-Oct-2022)   [employer] of Occupational Health - Occupational Stress Questionnaire    Feeling of Stress : Not  at all  Social Connections: Socially Integrated (09/18/2022)   Social Connection and Isolation Panel [NHANES]    Frequency of Communication with Friends and Family: More than three times a week     Frequency of Social Gatherings with Friends and Family: More than three times a week    Attends Religious Services: More than 4 times per year    Active Member of [employer] or Organizations: Yes    Attends Engineer, structural: More than 4 times per year    Marital Status: Married     Review of Systems   Gen: + back pani. Denies fever, chills, anorexia. Denies fatigue, weakness, weight loss.  CV: Denies chest pain, palpitations, syncope, peripheral edema, and claudication. Resp: Denies dyspnea at rest, cough, wheezing, coughing up blood, and pleurisy. GI: See HPI Derm: Denies rash, itching, dry skin Psych: Denies depression, anxiety, memory loss, confusion. No homicidal or suicidal ideation.  Heme: Denies bruising, bleeding, and enlarged lymph nodes.   Physical Exam   BP (!) 95/58 (BP Location: Right Arm, Patient Position: Sitting, Cuff Size: Normal)   Pulse 62   Temp 97.6 F (36.4 C) (Oral)   Ht 5\' 2"  (1.575 m)   Wt 105 lb 3.2 oz (47.7 kg)   BMI 19.24 kg/m   General:   Alert and oriented. No distress noted. Thin. Pleasant and cooperative.  Head:  Normocephalic and atraumatic. Eyes:  Conjuctiva clear without scleral icterus. Mouth:  Oral mucosa pink and moist. Good dentition. No lesions. Lungs:  Clear to auscultation bilaterally. No wheezes, rales, or rhonchi. No distress.  Heart:  S1, S2 present without murmurs appreciated.  Abdomen:  +BS, soft, non-tender and non-distended. No rebound or guarding. No HSM or masses noted. Rectal: deferred Msk:  Symmetrical without gross deformities. Normal posture. Extremities:  Without edema. Neurologic:  Alert and  oriented x4 Psych:  Alert and cooperative. Normal mood and affect.   Assessment  [name] is a 82 y.o. female with a history of IDA, GERD, A-fib on Eliquis, colon cancer s/p resection in 2006, gastroparesis, HLD, and tobacco abuse presenting today for follow up.  presenting today with   Iron deficiency anemia, hx  of colon cancer: Colonoscopy May 2023 with 3 tubular adenomas, due for surveillance in 2026 given suboptimal prep in the cecum.  History of incomplete capsule study in 2021 with 2 small jejunal diverticula without bleeding.  Nonspecific gastritis in 2021 EGD.  Maintained on anticoagulation for A-fib and takes iron daily.  Hemoglobin in January 2024 was 10.6, recent hemoglobin within normal range at 11.5.  No alarm symptoms present and denies any GI bleeding recently.  Will continue with plan to repeat capsule endoscopy if she has any signs of GI bleeding or drop in hemoglobin.  Constipation: Well-controlled with lactulose 10 g nightly.  PLAN   Continue daily iron. Continue B12 supplementation, could reduce frequency given recent labs.  Continue lactulose 10 g daily Monitor for overt GI bleeding Follow-up with PCP and cardiology regarding low blood pressure. Follow-up in 1 year.    Brooke Bonito, MSN, FNP-BC, AGACNP-BC [facility]  I have reviewed the note and agree with the APP's assessment as described in this progress note  Katrinka Blazing, MD Gastroenterology and Hepatology [facility]

## 2023-03-01 NOTE — Patient Instructions (Signed)
Continue your daily iron as well as your lactulose nightly.  Monitor for any black stool or bright red blood in your stool.  If you begin to have any worsening constipation please let me know.  Follow-up in 1 year, sooner if needed.  Happy Holidays!  It was a pleasure to see you today. I want to create trusting relationships with patients. If you receive a survey regarding your visit,  I greatly appreciate you taking time to fill this out on paper or through your MyChart. I value your feedback.  Brooke Bonito, MSN, FNP-BC, AGACNP-BC [facility]

## 2023-03-02 ENCOUNTER — Ambulatory Visit: Payer: Medicare Other | Admitting: Family Medicine

## 2023-03-02 ENCOUNTER — Encounter: Payer: Self-pay | Admitting: Family Medicine

## 2023-03-02 VITALS — BP 92/66 | HR 63 | Wt 103.6 lb

## 2023-03-02 DIAGNOSIS — I951 Orthostatic hypotension: Secondary | ICD-10-CM

## 2023-03-02 DIAGNOSIS — Z23 Encounter for immunization: Secondary | ICD-10-CM

## 2023-03-02 DIAGNOSIS — G894 Chronic pain syndrome: Secondary | ICD-10-CM | POA: Diagnosis not present

## 2023-03-02 DIAGNOSIS — M5432 Sciatica, left side: Secondary | ICD-10-CM | POA: Diagnosis not present

## 2023-03-02 MED ORDER — HYDROCODONE-ACETAMINOPHEN 10-325 MG PO TABS: ORAL_TABLET | ORAL | 0 refills | Status: DC

## 2023-03-02 NOTE — Progress Notes (Signed)
Subjective:    Patient ID: [name], female    DOB: 26-Jan-1941, 82 y.o.   MRN: [id number]  HPI  Chronic pain syndrome  Needs flu shot - Plan: Flu Vaccine Trivalent High Dose (Fluad)  Patient with low back pain on left side radiates into the left leg and down the leg.  Intermittent some days worse than others  She relates that the pain medicine no longer holds her as well as it used to she is taking 3/day and feels she needs 4/day she states that her husband had some leftover hydrocodone which she has been utilizing 4/day She denies misusing or abusing it  She also has intermittent low blood pressures that make her feel woozy that she is scheduled to see cardiology tomorrow  This patient was seen today for chronic pain  The medication list was reviewed and updated.   Location of Pain for which the patient has been treated with regarding narcotics: Low back pain sciatica  Onset of this pain: Present for years   -Compliance with medication: Good compliance  - Number patient states they take daily: Recently 4/day  -Reason for ongoing use of opioids Tylenol not beneficial enough has tried injections has tried surgery without success patient cannot take NSAIDs because of Eliquis  What other measures have been tried outside of opioids see above  In the ongoing specialists regarding this condition back specialist previously none currently  -when was the last dose patient took?  Earlier today  The patient was advised the importance of maintaining medication and not using illegal substances with these.  Here for refills and follow up  The patient was educated that we can provide 3 monthly scripts for their medication, it is their responsibility to follow the instructions.  Side effects or complications from medications: Denies side effects  Patient is aware that pain medications are meant to minimize the severity of the pain to allow their pain levels to improve to allow  for better function. They are aware of that pain medications cannot totally remove their pain.  Due for UDT ( at least once per year) (pain management contract is also completed at the time of the UDT): On next visit  Scale of 1 to 10 ( 1 is least 10 is most) Your pain level without the medicine: 9 Your pain level with medication 6  Scale 1 to 10 ( 1-helps very little, 10 helps very well) How well does your pain medication reduce your pain so you can function better through out the day?  8  Quality of the pain: Throbbing aching  Persistence of the pain: Present all the time  Modifying factors: Worse with activity      Review of Systems     Objective:   Physical Exam General-in no acute distress Eyes-no discharge Lungs-respiratory rate normal, CTA CV-no murmurs,RRR Extremities skin warm dry no edema Neuro grossly normal Behavior normal, alert        Assessment & Plan:  1. Chronic pain syndrome The patient was seen in followup for chronic pain. A review over at their current pain status was discussed. Drug registry was checked. Prescriptions were given.  Regular follow-up recommended. Discussion was held regarding the importance of compliance with medication as well as pain medication contract.  Patient was informed that medication may cause drowsiness and should not be combined  with other medications/alcohol or street drugs. If the patient feels medication is causing altered alertness then do not drive or operate dangerous equipment.  Should be noted that the patient appears to be meeting appropriate use of opioids and response.  Evidenced by improved function and decent pain control without significant side effects and no evidence of overt aberrancy issues.  Upon discussion with the patient today they understand that opioid therapy is optional and they feel that the pain has been refractory to reasonable conservative measures and is significant and affecting quality of  life enough to warrant ongoing therapy and wishes to continue opioids.  Refills were provided.  [facility] medical Board guidelines regarding the pain medicine has been reviewed.  CDC guidelines most updated 2022 has been reviewed by the prescriber.  PDMP is checked on a regular basis yearly urine drug screen and pain management contract  Shared discussion increase hydrocodone to 4/day if drowsiness do not drive patient to follow-up within 3 months  2. Needs flu shot Today - Flu Vaccine Trivalent High Dose (Fluad)  3. Orthostasis Patient has intermittently low blood pressures she states for no good reason.  She states her nutrition is fairly good tries to take decent amount of fluid intake Blood pressure after sitting for a period of time 92/66 no significant drop with standing  4. Sciatica of left side Offered consultation with pain management and consultation with back specialist for injections patient defers  Eliquis may end up needing to be tapered down to 2.5 twice daily because of a change in size she will see Dr. Wyline Mood coming up

## 2023-03-03 ENCOUNTER — Telehealth: Payer: Self-pay | Admitting: *Deleted

## 2023-03-03 ENCOUNTER — Encounter: Payer: Self-pay | Admitting: Cardiology

## 2023-03-03 ENCOUNTER — Ambulatory Visit: Payer: Medicare Other | Attending: Cardiology | Admitting: Cardiology

## 2023-03-03 VITALS — BP 124/60 | HR 58 | Ht 62.0 in | Wt 103.8 lb

## 2023-03-03 DIAGNOSIS — I480 Paroxysmal atrial fibrillation: Secondary | ICD-10-CM | POA: Diagnosis not present

## 2023-03-03 DIAGNOSIS — I951 Orthostatic hypotension: Secondary | ICD-10-CM | POA: Diagnosis not present

## 2023-03-03 DIAGNOSIS — Z13820 Encounter for screening for osteoporosis: Secondary | ICD-10-CM

## 2023-03-03 MED ORDER — APIXABAN 2.5 MG PO TABS: 2.5000 mg | ORAL_TABLET | Freq: Two times a day (BID) | ORAL | 11 refills | Status: DC

## 2023-03-03 NOTE — Telephone Encounter (Signed)
Please schedule patient for bone density test- patient was due in September

## 2023-03-03 NOTE — Patient Instructions (Addendum)
Medication Instructions:  Your physician has recommended you make the following change in your medication:   -Decrease Eliquis to 2.5 mg tablets twice daily.   *If you need a refill on your cardiac medications before your next appointment, please call your pharmacy*   Lab Work: None If you have labs (blood work) drawn today and your tests are completely normal, you will receive your results only by: MyChart Message (if you have MyChart) OR A paper copy in the mail If you have any lab test that is abnormal or we need to change your treatment, we will call you to review the results.   Testing/Procedures: None   Follow-Up: At [facility], you and your health needs are our priority.  As part of our continuing mission to provide you with exceptional heart care, we have created designated Provider Care Teams.  These Care Teams include your primary Cardiologist (physician) and Advanced Practice Providers (APPs -  Physician Assistants and Nurse Practitioners) who all work together to provide you with the care you need, when you need it.  We recommend signing up for the patient portal called "MyChart".  Sign up information is provided on this After Visit Summary.  MyChart is used to connect with patients for Virtual Visits (Telemedicine).  Patients are able to view lab/test results, encounter notes, upcoming appointments, etc.  Non-urgent messages can be sent to your provider as well.   To learn more about what you can do with MyChart, go to [email or web address].    Your next appointment:   4- 6 weeks  Provider:   You may see Dina Rich, MD or one of the following Advanced Practice Providers on your designated Care Team:   Randall An, PA-C  Jacolyn Reedy, [place]     Other Instructions Increase hydration and wear compression stockings.

## 2023-03-03 NOTE — Progress Notes (Signed)
Clinical Summary Ms. Berstler is a 81 y.o.female seen today for follow up of the following medical problems.   1. Palpitations/Dizziness/Orthostatic hypotension Prior Home orthostastics Lying down: 128/59 p 56 Sitting 115/62 p 60 Standing 95/59 p 70       - some recent low bp's at times, dizziness. Often with standing - 32 oz of water daily - previously on midodrine, appears stopped since our last visit for high bp's. Was on 5mg  tid.     2. Afib   - has done well on flecanide for several years - 08/2018 monitor without afib   - recent palpitations with activities, + SOB  - no recent palpitations.      3. Atypical chest pain - long history, prior ischemic testing has been negative   - seen 08/2017 by PA Strader for atypical chest pain. - 08/2017 nuclear stress negative.  -no recent symptoms   4. Hyperlipidemia 02/2023 TC 163 TG 98 HDL 63 LDL 82   Past Medical History:  Diagnosis Date   Adenocarcinoma, colon (HCC) 03/2005   Stage I; resected in 03/2005   Arthritis    Colon cancer (HCC)    Removed surgicaly    Dysrhythmia    Gastroesophageal reflux disease    Gastroparesis    Hearing loss    mild   Hyperlipidemia    Irregular heartbeat    Ovarian cyst 2008   Paroxysmal atrial fibrillation (HCC)    Palpitations; maintained on flecainide; -normal coronary angiography and 1999; negative stress nuclear study in 11/2005   PONV (postoperative nausea and vomiting)    Tobacco abuse, in remission    10-pack-years; quit in 2001     No Known Allergies   Current Outpatient Medications  Medication Sig Dispense Refill   alendronate (FOSAMAX) 70 MG tablet TAKE ONE TABLET (70 MG TOTAL) BY MOUTH EVERY 7 DAYS. TAKE WITH A FULL GLASS OF WATER ON AN EMPTY STOMACH. 4 tablet 5   apixaban (ELIQUIS) 5 MG TABS tablet TAKE ONE TABLET (5MG  TOTAL) BY MOUTH TWOTIMES DAILY 30 tablet 0   AREXVY 120 MCG/0.5ML injection Inject 0.5 mLs into the muscle once.     Calcium Carbonate  (CALCIUM 600 PO) Take 600 mg by mouth daily.     ferrous sulfate 325 (65 FE) MG tablet Take 325 mg by mouth daily with breakfast.     flecainide (TAMBOCOR) 100 MG tablet TAKE ONE TABLET (100MG  TOTAL) BY MOUTH EVERY 12 HOURS 60 tablet 11   HYDROcodone-acetaminophen (NORCO) 10-325 MG tablet 1 taken 4 times daily as needed for pain 120 tablet 0   HYDROcodone-acetaminophen (NORCO) 10-325 MG tablet One qid prn pain 120 tablet 0   HYDROcodone-acetaminophen (NORCO) 10-325 MG tablet One qid prn pain 120 tablet 0   lactulose (CHRONULAC) 10 GM/15ML solution Take 30 mLs (20 g total) by mouth at bedtime. 946 mL 3   omeprazole (PRILOSEC) 20 MG capsule Take 20 mg by mouth daily before breakfast.      rosuvastatin (CRESTOR) 40 MG tablet TAKE ONE TABLET (40MG  TOTAL) BY MOUTH DAILY 90 tablet 3   vitamin B-12 (CYANOCOBALAMIN) 1000 MCG tablet Take 1,000 mcg by mouth daily.     No current facility-administered medications for this visit.     Past Surgical History:  Procedure Laterality Date   ABDOMINAL HYSTERECTOMY  1983   BACK SURGERY     BIOPSY  10/18/2017   Procedure: BIOPSY;  Surgeon: Malissa Hippo, MD;  [facility];  Service:  Endoscopy;;  gastric   BIOPSY  07/06/2018   Procedure: BIOPSY;  Surgeon: Malissa Hippo, MD;  [facility];  Service: Endoscopy;;  gastric   CATARACT EXTRACTION W/PHACO Right 12/22/2021   Procedure: CATARACT EXTRACTION PHACO AND INTRAOCULAR LENS PLACEMENT (IOC);  Surgeon: Fabio Pierce, MD;  Location: [facility];  Service: Ophthalmology;  Laterality: Right;  CDE: 13.86   CATARACT EXTRACTION W/PHACO Left 01/05/2022   Procedure: CATARACT EXTRACTION PHACO AND INTRAOCULAR LENS PLACEMENT (IOC);  Surgeon: Fabio Pierce, MD;  Location: [facility];  Service: Ophthalmology;  Laterality: Left;  CDE 21.39   CHOLECYSTECTOMY     COLON SURGERY  2006   Colon cancer   COLONOSCOPY  03/25/2012   Malissa Hippo, MD   COLONOSCOPY N/A 04/26/2017   Procedure: COLONOSCOPY;   Surgeon: Malissa Hippo, MD;  [facility];  Service: Endoscopy;  Laterality: N/A;  2:00   COLONOSCOPY N/A 07/06/2018   Procedure: COLONOSCOPY;  Surgeon: Malissa Hippo, MD;  [facility];  Service: Endoscopy;  Laterality: N/A;  10:30   COLONOSCOPY WITH PROPOFOL N/A 09/10/2021   Procedure: COLONOSCOPY WITH PROPOFOL;  Surgeon: Malissa Hippo, MD;  [facility];  Service: Endoscopy;  Laterality: N/A;  910   COSMETIC SURGERY  09/2007   ESOPHAGOGASTRODUODENOSCOPY N/A 10/18/2017   Procedure: ESOPHAGOGASTRODUODENOSCOPY (EGD);  Surgeon: Malissa Hippo, MD;  [facility];  Service: Endoscopy;  Laterality: N/A;   ESOPHAGOGASTRODUODENOSCOPY N/A 07/06/2018   Procedure: ESOPHAGOGASTRODUODENOSCOPY (EGD);  Surgeon: Malissa Hippo, MD;  [facility];  Service: Endoscopy;  Laterality: N/A;   GIVENS CAPSULE STUDY N/A 12/26/2019   Procedure: GIVENS CAPSULE STUDY;  Surgeon: Malissa Hippo, MD;  [facility];  Service: Endoscopy;  Laterality: N/A;  730   PARTIAL COLECTOMY  2006   adenocarcinoma   POLYPECTOMY  04/26/2017   Procedure: POLYPECTOMY;  Surgeon: Malissa Hippo, MD;  [facility];  Service: Endoscopy;;  colon   POLYPECTOMY  07/06/2018   Procedure: POLYPECTOMY;  Surgeon: Malissa Hippo, MD;  [facility];  Service: Endoscopy;;  colon    POLYPECTOMY  09/10/2021   Procedure: POLYPECTOMY;  Surgeon: Malissa Hippo, MD;  [facility];  Service: Endoscopy;;  transverse x2;sigmoid   TONSILLECTOMY       No Known Allergies    Family History  Problem Relation Age of Onset   Heart disease Mother    Stroke Mother    Heart attack Mother    Cancer Father        lung   Heart disease Father    Hypertension Father    Cancer Brother        rectal   Healthy Son    Heart failure Sister    Stroke Sister      Social History Ms. Kramm reports that she quit smoking about 20 years ago. Her smoking use  included cigarettes. She started smoking about 60 years ago. She has a 12 pack-year smoking history. She has never used smokeless tobacco. Ms. Sidelinger reports no history of alcohol use.   Review of Systems CONSTITUTIONAL: No weight loss, fever, chills, weakness or fatigue.  HEENT: Eyes: No visual loss, blurred vision, double vision or yellow sclerae.No hearing loss, sneezing, congestion, runny nose or sore throat.  SKIN: No rash or itching.  CARDIOVASCULAR: per hpi RESPIRATORY: No shortness of breath, cough or sputum.  GASTROINTESTINAL: No anorexia, nausea, vomiting or diarrhea. No abdominal pain  or blood.  GENITOURINARY: No burning on urination, no polyuria NEUROLOGICAL: No headache, dizziness, syncope, paralysis, ataxia, numbness or tingling in the extremities. No change in bowel or bladder control.  MUSCULOSKELETAL: No muscle, back pain, joint pain or stiffness.  LYMPHATICS: No enlarged nodes. No history of splenectomy.  PSYCHIATRIC: No history of depression or anxiety.  ENDOCRINOLOGIC: No reports of sweating, cold or heat intolerance. No polyuria or polydipsia.  Marland Kitchen   Physical Examination Today's Vitals   03/03/23 1355  BP: 124/60  Pulse: (!) 58  SpO2: 98%  Weight: 103 lb 12.8 oz (47.1 kg)  Height: 5\' 2"  (1.575 m)   Body mass index is 18.99 kg/m.  Gen: resting comfortably, no acute distress HEENT: no scleral icterus, pupils equal round and reactive, no palptable cervical adenopathy,  CV RRR, no m/rg, no jvd Resp: Clear to auscultation bilaterally GI: abdomen is soft, non-tender, non-distended, normal bowel sounds, no hepatosplenomegaly MSK: extremities are warm, no edema.  Skin: warm, no rash Neuro:  no focal deficits Psych: appropriate affect   Diagnostic Studies  08/2018 holter monitor 48 hr holter monitor Min HR 50, Max HR 103, Avg HR 68 Rare ventricular ectopy, 2 isolated PVCs Rare supraventricular ectopy. Primarily PACs. Three short runs of atach, longest 4  beats. Reported symptoms correlated with sinus rhythm   Assessment and Plan  1. Dizziness/palpitations/Orthostatic hypotension - intermittent low bp's, dizziness at times - poor oral hydration, has not been wearing compression stockings. Encouraged both.  - orthostatics today are arbormal. SBP 126 lying to 104 standing.  - may have to restart low dose midodrine pending symptoms over time, could try lower dose. May have to tolerate higher bp's to control her symptoms. She will return in 4-6 weeks reassess symptoms with hydration and compression stockings.      2. Afib/acquired thrombophlia -no symptoms - lower eliquis to 2.5mg  bid due to age and weight.    Antoine Poche, M.D.

## 2023-03-04 NOTE — Telephone Encounter (Signed)
Scheduled Bone density and informed patient  Location- [facility]   Appointment-  Wednesday, October 30,2024   Instructions- Arrive at 11:45 am .    Discontinue taking any calcium 48 hours (about 2 days) prior to procedure. Wear comfortable 2-piece clothing for the procedure. Bring a copy of all medications. If you need reschedule, please call [phone].

## 2023-03-08 ENCOUNTER — Other Ambulatory Visit: Payer: Self-pay | Admitting: Family Medicine

## 2023-03-11 ENCOUNTER — Encounter: Payer: Self-pay | Admitting: Family Medicine

## 2023-03-11 ENCOUNTER — Ambulatory Visit (HOSPITAL_COMMUNITY)
Admission: RE | Admit: 2023-03-11 | Discharge: 2023-03-11 | Disposition: A | Discharge status: Home / Self Care | Payer: Medicare Other | Source: Ambulatory Visit | Attending: Family Medicine | Admitting: Family Medicine

## 2023-03-11 DIAGNOSIS — Z780 Asymptomatic menopausal state: Secondary | ICD-10-CM | POA: Diagnosis not present

## 2023-03-11 DIAGNOSIS — Z13820 Encounter for screening for osteoporosis: Secondary | ICD-10-CM | POA: Insufficient documentation

## 2023-03-11 DIAGNOSIS — M810 Age-related osteoporosis without current pathological fracture: Secondary | ICD-10-CM | POA: Insufficient documentation

## 2023-03-11 DIAGNOSIS — M85832 Other specified disorders of bone density and structure, left forearm: Secondary | ICD-10-CM | POA: Diagnosis not present

## 2023-03-18 ENCOUNTER — Telehealth: Payer: Self-pay

## 2023-03-18 NOTE — Telephone Encounter (Signed)
Call center routed phone call. Patient notified that latest labs have been mailed to her home. Patient verbalized understanding.

## 2023-03-19 ENCOUNTER — Other Ambulatory Visit: Payer: Self-pay | Admitting: Cardiology

## 2023-04-15 ENCOUNTER — Ambulatory Visit: Payer: Medicare Other | Admitting: Cardiology

## 2023-04-16 ENCOUNTER — Encounter: Payer: Self-pay | Admitting: Student

## 2023-04-16 ENCOUNTER — Ambulatory Visit: Payer: Medicare Other | Attending: Cardiology | Admitting: Student

## 2023-04-16 VITALS — BP 128/74 | HR 81 | Ht 62.0 in | Wt 105.0 lb

## 2023-04-16 DIAGNOSIS — I951 Orthostatic hypotension: Secondary | ICD-10-CM | POA: Diagnosis not present

## 2023-04-16 DIAGNOSIS — I480 Paroxysmal atrial fibrillation: Secondary | ICD-10-CM

## 2023-04-16 DIAGNOSIS — E785 Hyperlipidemia, unspecified: Secondary | ICD-10-CM | POA: Diagnosis not present

## 2023-04-16 DIAGNOSIS — Z87898 Personal history of other specified conditions: Secondary | ICD-10-CM | POA: Diagnosis not present

## 2023-04-16 NOTE — Progress Notes (Signed)
Cardiology Office Note    Date:  04/16/2023  ID:  Dorothye, Chesebro 1941-03-16, MRN [id number] Cardiologist: Dina Rich, MD    History of Present Illness:    [name] is a 82 y.o. female with past medical history of palpitations (monitor in 08/2018 showing PAC's, PVC's and atrial tachycardia), history of atypical chest pain (NST in 08/2017 without ischemia), paroxysmal atrial fibrillation (on Flecainide), orthostatic hypotension, anemia and HLD who presents to the office today for 6-week follow-up.  She was examined by Dr. Wyline Mood in 02/2023 and was still having some episodes of intermittent dizziness and orthostasis. She was orthostatic at the time of the visit with SBP dropping from 126 with lying to 104 with standing. She was encouraged to increase her fluid intake and to wear compression stockings. Follow-up was arranged and if BP remained soft and still with orthostasis, may need to restart low-dose Midodrine (she previously self discontinued this due to elevated BP). Eliquis was reduced from 5 mg twice daily to 2.5 mg twice daily given her age and weight.  In talking with the patient today, she reports overall feeling well since her last office visit. She reports only having approximately 1-2 episodes of dizziness since then and her blood pressure was soft at those times but improved with sitting and resting. The episodes occurred while standing in her kitchen. She has increased her fluid intake to 3-4 bottles of water a day and feels like this has helped with symptoms. Was intolerant to wearing compression stockings. No recent chest pain or palpitations. No recent dyspnea on exertion, orthopnea, PND or lower extremity edema. She remains on Eliquis for anticoagulation with no reports of active bleeding.  Studies Reviewed:   EKG: EKG is not ordered today.  Event Monitor: 08/2018 48 hr holter monitor Min HR 50, Max HR 103, Avg HR 68 Rare ventricular ectopy, 2 isolated PVCs Rare  supraventricular ectopy. Primarily PACs. Three short runs of atach, longest 4 beats. Reported symptoms correlated with sinus rhythm  Echocardiogram: 09/2022 IMPRESSIONS     1. Left ventricular ejection fraction, by estimation, is 60 to 65%. The  left ventricle has normal function. The left ventricle has no regional  wall motion abnormalities. Left ventricular diastolic parameters are  indeterminate.   2. Right ventricular systolic function is normal. The right ventricular  size is normal. There is normal pulmonary artery systolic pressure.   3. The mitral valve is normal in structure. Mild mitral valve  regurgitation. No evidence of mitral stenosis.   4. The aortic valve is tricuspid. There is mild calcification of the  aortic valve. Aortic valve regurgitation is trivial. No aortic stenosis is  present.   5. The inferior vena cava is normal in size with greater than 50%  respiratory variability, suggesting right atrial pressure of 3 mmHg.   Comparison(s): No prior Echocardiogram.    Risk Assessment/Calculations:    CHA2DS2-VASc Score = 4   This indicates a 4.8% annual risk of stroke. The patient's score is based upon: CHF History: 0 HTN History: 1 Diabetes History: 0 Stroke History: 0 Vascular Disease History: 0 Age Score: 2 Gender Score: 1    Physical Exam:   VS:  BP 128/74   Pulse 81   Ht 5\' 2"  (1.575 m)   Wt 105 lb (47.6 kg)   SpO2 91%   BMI 19.20 kg/m    Wt Readings from Last 3 Encounters:  04/16/23 105 lb (47.6 kg)  03/03/23 103 lb 12.8 oz (47.1  kg)  03/02/23 103 lb 9.6 oz (47 kg)     GEN: Well nourished, well developed female appearing in no acute distress NECK: No JVD; No carotid bruits CARDIAC: RRR, no murmurs, rubs, gallops RESPIRATORY:  Clear to auscultation without rales, wheezing or rhonchi  ABDOMEN: Appears non-distended. No obvious abdominal masses. EXTREMITIES: No clubbing or cyanosis. No pitting edema.  Distal pedal pulses are 2+  bilaterally.   Assessment and Plan:   1. Dizziness/Orthostatic Hypotension - She reports symptoms have significantly improved since she increased her fluid consumption. Intolerant to compression stockings as discussed above. Her blood pressure is at 128/74 during today's visit and I did check orthostatics today and BP was at 122/70 with sitting and dropped to 106/64 with standing which is a slight improvement as compared to her last office visit. We reviewed possibly restarting Midodrine at a lower dose of 2.5 mg twice daily but she wishes to hold off on this given prior issues of hypertension while on this (was on 5 mg 3 times daily at that time). She will continue to follow symptoms at this time while focusing on increasing her fluid intake and I encouraged her to make [place] aware of any recurrent dizziness in the interim as we could add this back to her medication regimen.  2. Paroxysmal Atrial Fibrillation/Use of Long-tern Anticoagulation - She denies any recent palpitations and her heart rate is well-controlled in the 80's today and she is in normal sinus rhythm by examination. Remains on Flecainide 100 mg twice daily. - No reports of active bleeding. She is on Eliquis 2.5 mg twice daily for anticoagulation which is the appropriate dose at this time given her age, weight and renal function.  3. Atypical Chest Pain - Prior NST in 08/2017 showed no evidence of ischemia and was a low-risk study. She denies any recent anginal symptoms. Continue with risk factor modification.  4. HLD - Followed by her PCP. LDL was at 82 in 02/2023. Continue current medical therapy with Crestor 40 mg daily.  Signed, Ellsworth Lennox, PA-C

## 2023-04-16 NOTE — Patient Instructions (Addendum)
Medication Instructions:  Your physician recommends that you continue on your current medications as directed. Please refer to the Current Medication list given to you today.  Please call office if Blood Pressure is low.   *If you need a refill on your cardiac medications before your next appointment, please call your pharmacy*   Lab Work: NONE   If you have labs (blood work) drawn today and your tests are completely normal, you will receive your results only by: MyChart Message (if you have MyChart) OR A paper copy in the mail If you have any lab test that is abnormal or we need to change your treatment, we will call you to review the results.   Testing/Procedures: NONE   Follow-Up: At [facility], you and your health needs are our priority.  As part of our continuing mission to provide you with exceptional heart care, we have created designated Provider Care Teams.  These Care Teams include your primary Cardiologist (physician) and Advanced Practice Providers (APPs -  Physician Assistants and Nurse Practitioners) who all work together to provide you with the care you need, when you need it.  We recommend signing up for the patient portal called "MyChart".  Sign up information is provided on this After Visit Summary.  MyChart is used to connect with patients for Virtual Visits (Telemedicine).  Patients are able to view lab/test results, encounter notes, upcoming appointments, etc.  Non-urgent messages can be sent to your provider as well.   To learn more about what you can do with MyChart, go to [email or web address].    Your next appointment:   6 month(s)  Provider:   You may see Dina Rich, MD or one of the following Advanced Practice Providers on your designated Care Team:   Randall An, PA-C  Jacolyn Reedy, [place]     Other Instructions Thank you for choosing [facility]!

## 2023-04-27 ENCOUNTER — Emergency Department (HOSPITAL_COMMUNITY): Payer: Medicare Other

## 2023-04-27 ENCOUNTER — Encounter (HOSPITAL_COMMUNITY): Payer: Self-pay | Admitting: *Deleted

## 2023-04-27 ENCOUNTER — Ambulatory Visit: Payer: Self-pay | Admitting: Family Medicine

## 2023-04-27 ENCOUNTER — Emergency Department (HOSPITAL_COMMUNITY)
Admission: EM | Admit: 2023-04-27 | Discharge: 2023-04-27 | Disposition: A | Discharge status: Home / Self Care | Payer: Medicare Other | Attending: Emergency Medicine | Admitting: Emergency Medicine

## 2023-04-27 ENCOUNTER — Other Ambulatory Visit: Payer: Self-pay

## 2023-04-27 DIAGNOSIS — S0081XA Abrasion of other part of head, initial encounter: Secondary | ICD-10-CM | POA: Insufficient documentation

## 2023-04-27 DIAGNOSIS — G8929 Other chronic pain: Secondary | ICD-10-CM | POA: Insufficient documentation

## 2023-04-27 DIAGNOSIS — M542 Cervicalgia: Secondary | ICD-10-CM | POA: Insufficient documentation

## 2023-04-27 DIAGNOSIS — M79674 Pain in right toe(s): Secondary | ICD-10-CM | POA: Diagnosis not present

## 2023-04-27 DIAGNOSIS — S0031XA Abrasion of nose, initial encounter: Secondary | ICD-10-CM | POA: Insufficient documentation

## 2023-04-27 DIAGNOSIS — M549 Dorsalgia, unspecified: Secondary | ICD-10-CM | POA: Diagnosis not present

## 2023-04-27 DIAGNOSIS — M25562 Pain in left knee: Secondary | ICD-10-CM | POA: Insufficient documentation

## 2023-04-27 DIAGNOSIS — M25532 Pain in left wrist: Secondary | ICD-10-CM | POA: Diagnosis not present

## 2023-04-27 DIAGNOSIS — Z7901 Long term (current) use of anticoagulants: Secondary | ICD-10-CM | POA: Diagnosis not present

## 2023-04-27 DIAGNOSIS — Y92009 Unspecified place in unspecified non-institutional (private) residence as the place of occurrence of the external cause: Secondary | ICD-10-CM | POA: Diagnosis not present

## 2023-04-27 DIAGNOSIS — S0990XA Unspecified injury of head, initial encounter: Secondary | ICD-10-CM

## 2023-04-27 DIAGNOSIS — R519 Headache, unspecified: Secondary | ICD-10-CM | POA: Diagnosis not present

## 2023-04-27 DIAGNOSIS — M129 Arthropathy, unspecified: Secondary | ICD-10-CM | POA: Diagnosis not present

## 2023-04-27 DIAGNOSIS — R918 Other nonspecific abnormal finding of lung field: Secondary | ICD-10-CM | POA: Diagnosis not present

## 2023-04-27 DIAGNOSIS — W19XXXA Unspecified fall, initial encounter: Secondary | ICD-10-CM

## 2023-04-27 DIAGNOSIS — S199XXA Unspecified injury of neck, initial encounter: Secondary | ICD-10-CM | POA: Diagnosis not present

## 2023-04-27 DIAGNOSIS — S60212A Contusion of left wrist, initial encounter: Secondary | ICD-10-CM | POA: Diagnosis not present

## 2023-04-27 DIAGNOSIS — W06XXXA Fall from bed, initial encounter: Secondary | ICD-10-CM | POA: Insufficient documentation

## 2023-04-27 DIAGNOSIS — M19071 Primary osteoarthritis, right ankle and foot: Secondary | ICD-10-CM | POA: Diagnosis not present

## 2023-04-27 MED ORDER — OXYCODONE-ACETAMINOPHEN 5-325 MG PO TABS
1.0000 | ORAL_TABLET | Freq: Once | ORAL | Status: AC
  Administered 2023-04-27: 1 via ORAL
  Filled 2023-04-27: qty 1

## 2023-04-27 NOTE — Discharge Instructions (Signed)
As discussed your imaging today is reassuring with no signs of bony injury such as fractures or dislocated bones.  Additionally you do not have an internal head injury from your fall.  I have given you information below regarding symptoms to watch for which might suggest a concussion.  If your headache and other symptoms are not resolved or significantly improving over the next week, I recommend a recheck by your primary MD.  Apply ice to your areas of bruising is much as is comfortable for the next 2 days to help with swelling and bruising.  As discussed there is an opacity in your right upper lung field which is incompletely characterized but was caught on the CT scan of your cervical spine today.  We are recommending a full chest CT scan for further evaluation of this finding.  I will reach out to Dr. Gerda Diss to let him know of this finding as well, but I recommend calling him as well to make sure this scan gets completed.

## 2023-04-27 NOTE — Telephone Encounter (Signed)
I agree in her situation she needs to go to the ER thank you

## 2023-04-27 NOTE — ED Triage Notes (Signed)
Pt states she rolled out of the bed and landed on her face; pt has small abrasions to right eyebrow and bridge of nose; pt having pain to back of her neck and states it hurts to move her neck  C-collar placed on pt in triage

## 2023-04-27 NOTE — ED Notes (Signed)
Patient transported to CT 

## 2023-04-27 NOTE — ED Provider Notes (Addendum)
[place] EMERGENCY DEPARTMENT AT [facility] Provider Note   CSN: [id number] Arrival date & time: 04/27/23  0944     History  Chief Complaint  Patient presents with   Marletta Lor    [name] is a 82 y.o. female presenting for evaluation of multiple injuries sustained when she fell out of bed early this morning hitting her forehead on the floor.  She describes having recurrent vivid dreams of running and states this is her second fall out of bed within the past several weeks. She has complaint of posterior neck pain with movement since this current fall along with moderate generalized headache,  left wrist and left knee pain and right great toe pain since the fall. She denies extremity weakness, numbness, has no dizziness, n/v, gait instability. She takes hydrocodone for chronic back pain q 6 hours. Does taken prior to arrival not effective,  has tried no other meds or treatments.  The history is provided by the patient.       Home Medications Prior to Admission medications   Medication Sig Start Date End Date Taking? Authorizing Provider  alendronate (FOSAMAX) 70 MG tablet TAKE ONE TABLET (70 MG TOTAL) BY MOUTH EVERY 7 DAYS. TAKE WITH A FULL GLASS OF WATER ON AN EMPTY STOMACH. 03/09/23   Babs Sciara, MD  apixaban (ELIQUIS) 2.5 MG TABS tablet Take 1 tablet (2.5 mg total) by mouth 2 (two) times daily. 03/03/23   Antoine Poche, MD  Calcium Carbonate (CALCIUM 600 PO) Take 600 mg by mouth daily.    [provider]  ferrous sulfate 325 (65 FE) MG tablet Take 325 mg by mouth daily with breakfast.    [provider]  flecainide (TAMBOCOR) 100 MG tablet TAKE ONE TABLET (100MG  TOTAL) BY MOUTH EVERY 12 HOURS 09/02/22   Antoine Poche, MD  HYDROcodone-acetaminophen [facility]) 10-325 MG tablet 1 taken 4 times daily as needed for pain 03/02/23   Babs Sciara, MD  HYDROcodone-acetaminophen [facility]) 10-325 MG tablet One qid prn pain 03/02/23   Babs Sciara,  MD  HYDROcodone-acetaminophen [facility]) 10-325 MG tablet One qid prn pain 03/02/23   Babs Sciara, MD  lactulose (CHRONULAC) 10 GM/15ML solution Take 30 mLs (20 g total) by mouth at bedtime. 11/25/22   Babs Sciara, MD  omeprazole (PRILOSEC) 20 MG capsule Take 20 mg by mouth daily before breakfast.     [provider]  rosuvastatin (CRESTOR) 40 MG tablet TAKE ONE TABLET (40MG  TOTAL) BY MOUTH DAILY 03/19/23   Antoine Poche, MD  vitamin B-12 (CYANOCOBALAMIN) 1000 MCG tablet Take 1,000 mcg by mouth daily.    [provider]      Allergies    Patient has no known allergies.    Review of Systems   Review of Systems  Constitutional:  Negative for fever.  HENT:  Negative for congestion and sore throat.   Eyes: Negative.   Respiratory:  Negative for chest tightness and shortness of breath.   Cardiovascular:  Negative for chest pain.  Gastrointestinal:  Negative for abdominal pain and nausea.  Genitourinary: Negative.   Musculoskeletal:  Positive for arthralgias. Negative for joint swelling and neck pain.  Skin: Negative.  Negative for rash and wound.  Neurological:  Positive for headaches. Negative for dizziness, weakness, light-headedness and numbness.  Psychiatric/Behavioral: Negative.      Physical Exam Updated Vital Signs BP (!) 131/47   Pulse 68   Temp 97.7 F (36.5 C) (Oral)  Resp 19   Ht 5\' 2"  (1.575 m)   Wt 47.6 kg   SpO2 100%   BMI 19.19 kg/m  Physical Exam Vitals and nursing note reviewed.  Constitutional:      Appearance: She is well-developed.  HENT:     Head: Normocephalic and atraumatic.     Comments: Faint abrasion right temple and nasal bridge.  No facial deformity or edema.     Right Ear: Tympanic membrane normal.     Left Ear: Tympanic membrane normal.  Eyes:     Extraocular Movements: Extraocular movements intact.     Conjunctiva/sclera: Conjunctivae normal.     Pupils: Pupils are equal, round, and reactive to light.   Cardiovascular:     Rate and Rhythm: Normal rate.     Pulses:          Radial pulses are 2+ on the right side and 2+ on the left side.       Dorsalis pedis pulses are 2+ on the right side.  Pulmonary:     Effort: Pulmonary effort is normal.  Musculoskeletal:        General: Tenderness present. No deformity. Normal range of motion.     Cervical back: Normal range of motion and neck supple.     Comments: No ligament instability left knee.  No obvious visible trauma or deformity right great toe.    Lymphadenopathy:     Cervical: No cervical adenopathy.  Skin:    General: Skin is warm and dry.     Findings: No rash.     Comments: Bruising noted left dorsal wrist., faintly bruised left suprapatellar region.  Neurological:     General: No focal deficit present.     Mental Status: She is alert and oriented to person, place, and time.     GCS: GCS eye subscore is 4. GCS verbal subscore is 5. GCS motor subscore is 6.     Cranial Nerves: No cranial nerve deficit.     Sensory: No sensory deficit.     Coordination: Coordination normal.     Gait: Gait normal.     Deep Tendon Reflexes: Reflexes normal.     Comments: Normal grip strength,  normal RAM,  Cranial nerves III-XII intact.  No pronator drift.  Psychiatric:        Speech: Speech normal.        Behavior: Behavior normal.        Thought Content: Thought content normal.     ED Results / Procedures / Treatments   Labs (all labs ordered are listed, but only abnormal results are displayed) Labs Reviewed - No data to display  EKG None  Radiology DG Toe Great Right Result Date: 04/27/2023 CLINICAL DATA:  Great toe pain after fall, rolled off the bed. EXAM: RIGHT GREAT TOE COMPARISON:  None Available. FINDINGS: There is no evidence of fracture or dislocation. Mild degenerative change of the first metatarsal phalangeal joint. Soft tissues are unremarkable. IMPRESSION: 1. No fracture or subluxation of the right great toe. 2. Mild  degenerative change of the first metatarsophalangeal joint. Electronically Signed   By: Narda Rutherford M.D.   On: 04/27/2023 15:31   DG Knee Complete 4 Views Left Result Date: 04/27/2023 CLINICAL DATA:  Pain after fall.  Rolled off bed last night. EXAM: LEFT KNEE - COMPLETE 4+ VIEW COMPARISON:  None Available. FINDINGS: No evidence of fracture, dislocation, or joint effusion. The alignment joint spaces are normal. Mild peripheral spurring in the medial tibiofemoral and  patellofemoral compartments. Soft tissues are unremarkable. IMPRESSION: 1. No fracture or subluxation of the left knee. 2. Mild degenerative spurring in the medial tibiofemoral and patellofemoral compartments. Electronically Signed   By: Narda Rutherford M.D.   On: 04/27/2023 15:30   DG Wrist Complete Left Result Date: 04/27/2023 CLINICAL DATA:  Fall, rolled off the bed last night. Left wrist pain and bruising EXAM: LEFT WRIST - COMPLETE 3+ VIEW COMPARISON:  None Available. FINDINGS: No acute fracture or dislocation. Advanced cystic changes in the carpal bones. Chondrocalcinosis of the triangular fibrocartilage. Joint space narrowing and spurring of the thumb carpal metacarpal joint. No focal soft tissue abnormalities IMPRESSION: 1. No acute fracture or dislocation. 2. 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 3. Chondrocalcinosis of the triangular fibrocartilage. Electronically Signed   By: Narda Rutherford M.D.   On: 04/27/2023 15:29   CT Head Wo Contrast Result Date: 04/27/2023 CLINICAL DATA:  Head trauma, minor (Age >= 65y); Neck trauma (Age >= 65y). Headache after falling out of bed. EXAM: CT HEAD WITHOUT CONTRAST CT CERVICAL SPINE WITHOUT CONTRAST TECHNIQUE: Multidetector CT imaging of the head and cervical spine was performed following the standard protocol without intravenous contrast. Multiplanar CT image reconstructions of the cervical spine were also generated. RADIATION  DOSE REDUCTION: This exam was performed according to the departmental dose-optimization program which includes automated exposure control, adjustment of the mA and/or kV according to patient size and/or use of iterative reconstruction technique. COMPARISON:  None Available. FINDINGS: CT HEAD FINDINGS Brain: No acute intracranial hemorrhage. Gray-white differentiation is preserved. No hydrocephalus or extra-axial collection. No mass effect or midline shift. Vascular: No hyperdense vessel or unexpected calcification. Skull: No calvarial fracture or suspicious bone lesion. Skull base is unremarkable. Sinuses/Orbits: No acute finding. Other: None. CT CERVICAL SPINE FINDINGS Alignment: 3 mm degenerative stair step anterolisthesis of C4 on C5 and C5 on C6. Skull base and vertebrae: No acute fracture. Normal craniocervical junction. No suspicious bone lesions. Soft tissues and spinal canal: No prevertebral fluid or swelling. No visible canal hematoma. Disc levels: Mild cervical spondylosis without high-grade spinal canal stenosis. Upper chest: Partially imaged masslike opacity in the right upper lobe. Other: None. IMPRESSION: 1. No acute intracranial abnormality. 2. No acute cervical spine fracture or traumatic listhesis. 3. Partially imaged masslike opacity in the right upper lobe. This could represent chronic scarring; however, dedicated chest CT is recommended for further characterization. Electronically Signed   By: Orvan Falconer M.D.   On: 04/27/2023 15:00   CT Cervical Spine Wo Contrast Result Date: 04/27/2023 CLINICAL DATA:  Head trauma, minor (Age >= 65y); Neck trauma (Age >= 65y). Headache after falling out of bed. EXAM: CT HEAD WITHOUT CONTRAST CT CERVICAL SPINE WITHOUT CONTRAST TECHNIQUE: Multidetector CT imaging of the head and cervical spine was performed following the standard protocol without intravenous contrast. Multiplanar CT image reconstructions of the cervical spine were also generated. RADIATION  DOSE REDUCTION: This exam was performed according to the departmental dose-optimization program which includes automated exposure control, adjustment of the mA and/or kV according to patient size and/or use of iterative reconstruction technique. COMPARISON:  None Available. FINDINGS: CT HEAD FINDINGS Brain: No acute intracranial hemorrhage. Gray-white differentiation is preserved. No hydrocephalus or extra-axial collection. No mass effect or midline shift. Vascular: No hyperdense vessel or unexpected calcification. Skull: No calvarial fracture or suspicious bone lesion. Skull base is unremarkable. Sinuses/Orbits: No acute finding. Other: None. CT CERVICAL SPINE FINDINGS Alignment: 3 mm degenerative stair  step anterolisthesis of C4 on C5 and C5 on C6. Skull base and vertebrae: No acute fracture. Normal craniocervical junction. No suspicious bone lesions. Soft tissues and spinal canal: No prevertebral fluid or swelling. No visible canal hematoma. Disc levels: Mild cervical spondylosis without high-grade spinal canal stenosis. Upper chest: Partially imaged masslike opacity in the right upper lobe. Other: None. IMPRESSION: 1. No acute intracranial abnormality. 2. No acute cervical spine fracture or traumatic listhesis. 3. Partially imaged masslike opacity in the right upper lobe. This could represent chronic scarring; however, dedicated chest CT is recommended for further characterization. Electronically Signed   By: Orvan Falconer M.D.   On: 04/27/2023 15:00    Procedures Procedures    Medications Ordered in ED Medications  oxyCODONE-acetaminophen (PERCOCET/ROXICET) 5-325 MG per tablet 1 tablet (1 tablet Oral Given 04/27/23 1349)    ED Course/ Medical Decision Making/ A&P                                 Medical Decision Making Pt presenting with minor head injury after rolling out of bed last night.  No loc, no sx suggesting concussion at this time.  Imaging and exam are reasuring.  Discussed home tx  and close f/u pcp for sx persisting beyond the next week,  minor head injury instructions given.  Pt is currently taking hydrocodone 5/325 q 6 hours chronically, offered increasing to oxycodone for a few days while she recovers from this new injury, pt deferred.  Cannot take nsaids as is on eliquis.  Incidental mass/scar/ opacity incompletely identified RU lung.  Discussed with pt and rec of CT chest.  She prefers to have this addressed as outpatient given length of time already in the dept today.  She was encouraged to contact her pcp to arrange this CT. Pt understands.  I also messaged Dr. Gerda Diss making him aware of this finding today.   Amount and/or Complexity of Data Reviewed Radiology: ordered.    Details: CT head and c spine,  left wrist, left knee and right great toe imaging reviewed, agree with interpretation, no fracture, dislocation, no obvious intracranial injury.   Risk Prescription drug management.           Final Clinical Impression(s) / ED Diagnoses Final diagnoses:  Injury of head, initial encounter  Fall in home, initial encounter    Rx / DC Orders ED Discharge Orders     None         Victoriano Lain 04/29/23 2057    Burgess Amor, PA-C 04/29/23 2101    Bethann Berkshire, MD 04/30/23 1727

## 2023-04-27 NOTE — Telephone Encounter (Signed)
Copied from CRM [id number]. Topic: Clinical - Red Word Triage >> Apr 27, 2023  8:30 AM [name] wrote: Reason for CRM: PT fell out of her bed last night and hit her head/neck   Chief Complaint: Headache; fell out of bed Symptoms: Headache Frequency: constant Pertinent Negatives: Patient denies loss of consciousness Disposition: [x] ED /[] Urgent Care (no appt availability in office) / [] Appointment(In office/virtual)/ []  [facility]/ [] Home Care/ [] Refused Recommended Disposition /[] [facility]/ []  Follow-up with PCP Additional Notes: Pt reports falling out of bed last night, sts that she landed face down and hit the front of her head. Pt reports headache unrelieved with prescription pain medication. Patient is on Eliquis. RN advised patient to go to ED for evaluation. Pt relunctant, but agreeable. Pt sts that she will get her sister to take her.    Reason for Disposition  Taking Coumadin (warfarin) or other strong blood thinner, or known bleeding disorder (e.g., thrombocytopenia)  [1] SEVERE headache AND [2] not improved 2 hours after pain medicine/ice packs  Answer Assessment - Initial Assessment Questions 1. MECHANISM: "How did the injury happen?" For falls, ask: "What height did you fall from?" and "What surface did you fall against?"      Fell out of bed last night and this morning. Hit forehead on floor last night  2. ONSET: "When did the injury happen?" (Minutes or hours ago)      Last night  3. NEUROLOGIC SYMPTOMS: "Was there any loss of consciousness?" "Are there any other neurological symptoms?"      Did not lose consciousness, sts that she had a bad dream  4. MENTAL STATUS: "Does the person know who they are, who you are, and where they are?"      Patient is A&Ox3  5. LOCATION: "What part of the head was hit?"      Front of head  6. SCALP APPEARANCE: "What does the scalp look like? Is it bleeding now?" If Yes, ask: "Is it difficult to stop?"      No  bleeding  7. SIZE: For cuts, bruises, or swelling, ask: "How large is it?" (e.g., inches or centimeters)      Bruising on right leg, left hand, and left kneecap  8. PAIN: "Is there any pain?" If Yes, ask: "How bad is it?"  (e.g., Scale 1-10; or mild, moderate, severe)     9/10 head pain , but sts that the bruising is tolerable  9. TETANUS: For any breaks in the skin, ask: "When was the last tetanus booster?"     No breaks  10. OTHER SYMPTOMS: "Do you have any other symptoms?" (e.g., neck pain, vomiting)       Neck pain  Protocols used: Head Injury-A-AH

## 2023-04-28 ENCOUNTER — Telehealth: Payer: Self-pay | Admitting: Family Medicine

## 2023-04-28 DIAGNOSIS — R9389 Abnormal findings on diagnostic imaging of other specified body structures: Secondary | ICD-10-CM

## 2023-04-28 NOTE — Telephone Encounter (Signed)
Nurses This patient was in the ER due to a fall They did a CT scan of her neck which showed increased markings in the upper lung with the possibility of this being a growth versus scarring in the right upper lobe  Please talk with patient let her know that I was able to see the ER notes as well as the scans and read over the reports and based on these reports the following Patient needs CT scan of the chest If she is willing we can go ahead and get this set up.  This is not emergent but nonetheless we would like to get the ball rolling on this Possible to do this week or next week that would be great Please let the patient know that when they do the scan due to radiologist shortages they can take 10 to 14 days to get results Patient already has a follow-up visit about the second week of January (If you feel like she needs to be seen sooner please work with her thank you)

## 2023-04-29 ENCOUNTER — Telehealth: Payer: Self-pay

## 2023-04-29 NOTE — Telephone Encounter (Signed)
Sounds good thank you

## 2023-04-29 NOTE — Telephone Encounter (Signed)
Patient scheduled office visit for  hospital follow up 05/03/23

## 2023-04-29 NOTE — Telephone Encounter (Signed)
CT ordered in EPIC 

## 2023-04-29 NOTE — Telephone Encounter (Signed)
Patient is will to proceed but wants it after Christmas  Patient said they called her and told her she needed ER follow up this week with Dr Lorin Picket

## 2023-04-29 NOTE — Transitions of Care (Post Inpatient/ED Visit) (Signed)
 04/29/2023  Name: [name] MRN: [id number] DOB: [birth year]  Today's TOC FU Call Status: Today's TOC FU Call Status:: Successful TOC FU Call Completed TOC FU Call Complete Date: 04/29/23 Patient's Name and Date of Birth confirmed.  Transition Care Management Follow-up Telephone Call Date of Discharge: 04/27/23 [facility] Type of Discharge: Emergency Department Reason for ED Visit: Other: (fall/unspecified injury of head) How have you been since you were released from the hospital?: Same (states the back of her head and neck hurt just as bad or worse  as the day she fell. her legs and knees are feeling better.) Any questions or concerns?: No  Items Reviewed: Did you receive and understand the discharge instructions provided?: Yes Medications obtained,verified, and reconciled?: Yes (Medications Reviewed) Any new allergies since your discharge?: No Dietary orders reviewed?: NA Do you have support at home?: Yes People in Home: child(ren), adult  Medications Reviewed Today: Medications Reviewed Today     Reviewed by Taevon Aschoff, Jordan Hawks, CMA (Certified Medical Assistant) on 04/29/23 at 1239  Med List Status: <None>   Medication Order Taking? Sig Documenting Provider Last Dose Status Informant  alendronate (FOSAMAX) 70 MG tablet [id number] No TAKE ONE TABLET (70 MG TOTAL) BY MOUTH EVERY 7 DAYS. TAKE WITH A FULL GLASS OF WATER ON AN EMPTY STOMACH. Babs Sciara, MD Taking Active   apixaban (ELIQUIS) 2.5 MG TABS tablet [id number] No Take 1 tablet (2.5 mg total) by mouth 2 (two) times daily. Antoine Poche, MD Taking Active   Calcium Carbonate (CALCIUM 600 PO) [id number] No Take 600 mg by mouth daily. [provider] Taking Active Self  ferrous sulfate 325 (65 FE) MG tablet [id number] No Take 325 mg by mouth daily with breakfast. [provider] Taking Active Self  flecainide (TAMBOCOR) 100 MG tablet [id number] No TAKE ONE TABLET (100MG  TOTAL)  BY MOUTH EVERY 12 HOURS Branch, Dorothe Pea, MD Taking Active   HYDROcodone-acetaminophen [facility]) 10-325 MG tablet [id number] No 1 taken 4 times daily as needed for pain Babs Sciara, MD Taking Active   HYDROcodone-acetaminophen [facility]) 10-325 MG tablet [id number] No One qid prn pain Babs Sciara, MD Taking Active   HYDROcodone-acetaminophen [facility]) 10-325 MG tablet [id number] No One qid prn pain Babs Sciara, MD Taking Active   lactulose (CHRONULAC) 10 GM/15ML solution [id number] No Take 30 mLs (20 g total) by mouth at bedtime. Babs Sciara, MD Taking Active   omeprazole (PRILOSEC) 20 MG capsule [id number] No Take 20 mg by mouth daily before breakfast.  [provider] Taking Active Self  rosuvastatin (CRESTOR) 40 MG tablet [id number] No TAKE ONE TABLET (40MG  TOTAL) BY MOUTH DAILY Branch, Dorothe Pea, MD Taking Active   vitamin B-12 (CYANOCOBALAMIN) 1000 MCG tablet [id number] No Take 1,000 mcg by mouth daily. [provider] Taking Active Self            Home Care and Equipment/Supplies: Were Home Health Services Ordered?: NA Any new equipment or medical supplies ordered?: NA  Functional Questionnaire: Do you need assistance with bathing/showering or dressing?: No Do you need assistance with meal preparation?: No Do you need assistance with eating?: No Do you have difficulty maintaining continence: No Do you need assistance with getting out of bed/getting out of a chair/moving?: No Do you have difficulty managing or taking your medications?: No  Follow up appointments reviewed: PCP Follow-up appointment confirmed?: Yes Date of PCP follow-up appointment?: 05/03/23 Follow-up Provider: Dr. Lorin Picket [facility] Follow-up appointment confirmed?:  NA Do you need transportation to your follow-up appointment?: No Do you understand care options if your condition(s) worsen?: Yes-patient verbalized understanding   Zohar Maroney, CMA  [facility] AWV Team Direct Dial:  [phone]

## 2023-04-29 NOTE — Telephone Encounter (Signed)
If she would like to do a ER follow-up we could set that up but unfortunately today my schedule is completely full  This could be set up on Monday.  This all depends on how she is feeling. If she feels she is having issues and would like to be seen Monday I will be more than willing to do so If she feels she is doing well then the most important thing is scheduling the CT scan and also keeping her follow-up visit in January

## 2023-05-03 ENCOUNTER — Ambulatory Visit: Payer: Medicare Other | Admitting: Family Medicine

## 2023-05-03 ENCOUNTER — Telehealth: Payer: Self-pay | Admitting: Family Medicine

## 2023-05-03 VITALS — BP 118/60 | HR 61 | Ht 62.0 in | Wt 105.0 lb

## 2023-05-03 DIAGNOSIS — R296 Repeated falls: Secondary | ICD-10-CM | POA: Diagnosis not present

## 2023-05-03 DIAGNOSIS — F515 Nightmare disorder: Secondary | ICD-10-CM

## 2023-05-03 NOTE — Progress Notes (Signed)
Subjective:    Patient ID: [name], female    DOB: 01-Apr-1941, 82 y.o.   MRN: [id number]  Discussed the use of AI scribe software for clinical note transcription with the patient, who gave verbal consent to proceed.  History of Present Illness   The patient, with an unspecified medical history, presents with a recent history of two falls from her bed during sleep. The first fall resulted in minor bruising but no head injury. The second fall, however, resulted in a significant impact to the back of the patient's head and subsequent persistent pain. The patient reports that both falls occurred during dreams in which she was running from something.  Since the second fall, the patient has been experiencing severe pain in the back of her head and neck, making it difficult to lie down and sleep. She has resorted to sleeping in a sitting position. She also reports a nagging headache or soreness across her forehead. The patient notes that her neck pain is localized to the back, with no pain in the front of the neck.  The patient also reports difficulty focusing and concentrating since the second fall, and she has noticed unsteadiness in her walking. She denies any other falls or injuries. The patient has not been out of the house since the fall occurred last Tuesday.  The patient is concerned about the sudden onset of these falls, as she has never experienced this before. She reports that she always sleeps in the middle of her bed and has never fallen off before. The patient has been considering various modifications to her bed to prevent future falls, including lowering the bed or adding a rail or pads.  The patient's bed is adjustable and cannot be lowered. She has considered removing the motorized part of the bed to lower it. The patient has also considered moving the bed against a wall and adding a pad on the other side. She has carpet on each side of the bed and a nightstand next to it. The  patient denies any other changes in her health or any other symptoms.      CAT scan reviewed with patient shows a possible area in the right upper lung needs CAT scan we have set this up Other x-rays do not show any fracture No additional scans necessary other than the chest CT await the result of this We did discuss strategies of minimizing injuries and falls  Review of Systems     Objective:    Physical Exam        General-in no acute distress Eyes-no discharge Lungs-respiratory rate normal, CTA CV-no murmurs,RRR Extremities skin warm dry no edema Neuro grossly normal Behavior normal, alert Patient has bruising on the left wrist the right knee a scrape on her forehead no appreciable bruising on the forehead I did observe her walking she has a narrow gait with short steps does not pick her feet up really well       Assessment & Plan:  Assessment and Plan    Falls during sleep Two recent falls from bed during sleep, possibly related to vivid dreams. No significant injuries but sustained bruises and discomfort. No other falls reported. -Consider moving bed against a wall and using a bedside pad to prevent future injury with falls. -Reach out to a sleep specialist for further recommendations.  Head and neck pain Pain in the back of the head and neck, possibly related to recent falls. No fractures noted on imaging. -Consider physical  therapy if pain does not improve over the next week or two.  Cognitive changes Reports difficulty focusing and concentration since the fall. Possible mild concussion. -Monitor cognitive changes and consider further evaluation if symptoms persist or worsen.  Walking difficulty Reports unsteadiness in walking since the fall. -Consider use of a cane or walker for added stability.  Follow-up Monitor symptoms and consider further interventions based on symptom progression and specialist recommendations.     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

## 2023-05-03 NOTE — Telephone Encounter (Signed)
Abnormal area on CT of the neck shows the need for a CT of the chest Patient was seen in the ER she was given the option to go ahead with CT of the chest she is opted to do a follow-up visit with [place] and discuss it further at that time

## 2023-05-09 ENCOUNTER — Telehealth: Payer: Self-pay | Admitting: Family Medicine

## 2023-05-09 NOTE — Telephone Encounter (Signed)
Please let patient know that I did communicate with sleep specialist neurologist regarding her situation with having the vivid dreams at nighttime.  The specialist stated that they could do the sleep study and further evaluate this issue if she would like to.  It is optional.  Please let patient know if she is interested we can initiate the referral if she would like to wait until her follow-up visit to discuss further that would be fine she has a follow-up visit middle of January thank you

## 2023-05-10 ENCOUNTER — Ambulatory Visit (HOSPITAL_COMMUNITY)
Admission: RE | Admit: 2023-05-10 | Discharge: 2023-05-10 | Disposition: A | Discharge status: Home / Self Care | Payer: Medicare Other | Source: Ambulatory Visit | Attending: Family Medicine | Admitting: Family Medicine

## 2023-05-10 DIAGNOSIS — I700 Atherosclerosis of aorta: Secondary | ICD-10-CM | POA: Diagnosis not present

## 2023-05-10 DIAGNOSIS — R9389 Abnormal findings on diagnostic imaging of other specified body structures: Secondary | ICD-10-CM | POA: Insufficient documentation

## 2023-05-10 DIAGNOSIS — J439 Emphysema, unspecified: Secondary | ICD-10-CM | POA: Diagnosis not present

## 2023-05-14 NOTE — Telephone Encounter (Signed)
 Called pt and informed her of Dr Gerda Diss comments, pt states she will wait to discuss more at follow up visit

## 2023-05-25 ENCOUNTER — Ambulatory Visit: Payer: Medicare Other | Admitting: Family Medicine

## 2023-05-25 VITALS — BP 118/66 | HR 60 | Temp 97.5°F | Ht 62.0 in | Wt 107.0 lb

## 2023-05-25 DIAGNOSIS — G8929 Other chronic pain: Secondary | ICD-10-CM | POA: Diagnosis not present

## 2023-05-25 DIAGNOSIS — I10 Essential (primary) hypertension: Secondary | ICD-10-CM

## 2023-05-25 DIAGNOSIS — J449 Chronic obstructive pulmonary disease, unspecified: Secondary | ICD-10-CM | POA: Diagnosis not present

## 2023-05-25 DIAGNOSIS — Z5181 Encounter for therapeutic drug level monitoring: Secondary | ICD-10-CM | POA: Diagnosis not present

## 2023-05-25 DIAGNOSIS — M5441 Lumbago with sciatica, right side: Secondary | ICD-10-CM

## 2023-05-25 DIAGNOSIS — Z79891 Long term (current) use of opiate analgesic: Secondary | ICD-10-CM | POA: Diagnosis not present

## 2023-05-25 DIAGNOSIS — I700 Atherosclerosis of aorta: Secondary | ICD-10-CM | POA: Diagnosis not present

## 2023-05-25 MED ORDER — HYDROCODONE-ACETAMINOPHEN 10-325 MG PO TABS: ORAL_TABLET | ORAL | 0 refills | Status: DC

## 2023-05-25 NOTE — Progress Notes (Signed)
 Subjective:    Patient ID: [name], female    DOB: 1940-11-15, 83 y.o.   MRN: [id number]  Discussed the use of AI scribe software for clinical note transcription with the patient, who gave verbal consent to proceed.  History of Present Illness   The patient, with a history of hypertension, atrial fibrillation, and chronic back pain, reports recent episodes of hypotension. She describes feeling weak and lightheaded, particularly upon standing, on days when her blood pressure is low. These symptoms have been occurring approximately four days a week for the past two weeks. The patient also reports chronic back pain, which is managed with four pain pills daily without any adverse effects such as drowsiness or a drugged feeling.  The patient rates her overall health as fair, acknowledging her chronic conditions but also her ability to manage them. She reports good mobility and no issues with breathing or bowel movements. She denies any mood disturbances.  The patient also mentions a recent CT scan, which did not reveal any growths or tumors, but did show some scarring in the lung possibly from a previous infection. The patient had a bout of COVID-19 in the past. The scan also showed evidence of minimal COPD and atherosclerosis. The patient is on rosuvastatin  for cholesterol management.  The patient lives alone and manages her household independently. She is considering moving due to a engineer, site project planned across the street from her home. She expresses gratitude for her current level of health and mobility at the age of 83.      Previous CT scan which was completed did not show any tumors or growths  Review of Systems     Objective:    Physical Exam   VITALS: BP- 124/72, BP- 124/68, BP- 116/66 EXTREMITIES: No ankle edema    General-in no acute distress Eyes-no discharge Lungs-respiratory rate normal, CTA CV-no murmurs,RRR Extremities skin warm dry no edema Neuro  grossly normal Behavior normal, alert Blood pressure recheck sitting and standing does drop some with standing but not severe       Assessment & Plan:  Assessment and Plan    Hypotension Episodes of low blood pressure with symptoms of lightheadedness and weakness, particularly upon standing. Plan to consult with cardiologist regarding possible adjustment of antihypertensive medication. -Contact cardiologist to discuss blood pressure concerns and potential medication adjustment.  Chronic Back Pain Managed with daily pain medication, requiring up to four pills per day for adequate pain control. No reported side effects. -Continue current pain management regimen.  Atrial Fibrillation Stable, with no new symptoms reported. -Continue current management.  Incidental Findings on CT Scan Evidence of mild COPD and atherosclerosis, but no symptomatic impact. No suspicious lesions or growths identified. -Continue current management, including rosuvastatin  for cholesterol control.  General Health Maintenance -Continue current vaccinations, including RSV vaccine. -Continue monitoring health status at home, particularly blood pressure. -Follow-up after consultation with cardiologist.        1. Hypertension, unspecified type (Primary) Blood pressure does fluctuate although at the same time today the readings are within normal range she does state at least 3 or 4 times per week they seem to be on the low end.  This particular topic was addressed by cardiology on her last visit with Laymon Qua, PA we will send notification to her and perhaps they will consider restarting midodrine  at a low dose or they will watch this  2. Chronic right-sided low back pain with right-sided sciatica Gentle range of motion exercises recommended  gentle stretches avoid heavy lifting  3. Encounter for monitoring opioid maintenance therapy The patient was seen in followup for chronic pain. A review over at  their current pain status was discussed. Drug registry was checked. Prescriptions were given.  Regular follow-up recommended. Discussion was held regarding the importance of compliance with medication as well as pain medication contract.  Patient was informed that medication may cause drowsiness and should not be combined  with other medications/alcohol or street drugs. If the patient feels medication is causing altered alertness then do not drive or operate dangerous equipment.  Should be noted that the patient appears to be meeting appropriate use of opioids and response.  Evidenced by improved function and decent pain control without significant side effects and no evidence of overt aberrancy issues.  Upon discussion with the patient today they understand that opioid therapy is optional and they feel that the pain has been refractory to reasonable conservative measures and is significant and affecting quality of life enough to warrant ongoing therapy and wishes to continue opioids.  Refills were provided.  [place]  medical Board guidelines regarding the pain medicine has been reviewed.  CDC guidelines most updated 2022 has been reviewed by the prescriber.  PDMP is checked on a regular basis yearly urine drug screen and pain management contract  Patient does state that the pain medicine benefits are she would like to continue this.  4. Aortic atherosclerosis (HCC) Patient already on statin incidental finding on CT  5. Chronic obstructive pulmonary disease, unspecified COPD type (HCC) Incidental finding on CT patient with a remote history of smoking also exposed to smoking through her husband who has passed away.  Patient does not smoke.  Not having any significant shortness of breath no need to add any medications

## 2023-05-31 ENCOUNTER — Telehealth: Payer: Self-pay | Admitting: Student

## 2023-05-31 NOTE — Telephone Encounter (Signed)
  Pt c/o BP issue: STAT if pt c/o blurred vision, one-sided weakness or slurred speech  1. What are your last 5 BP readings?  70-80s/40-50s  2. Are you having any other symptoms (ex. Dizziness, headache, blurred vision, passed out)? Fatigue, dizziness and lightheadedness   3. What is your BP issue? Pt stated that for the last 2-3 weeks, her BP has been low. She has been feeling terrible every day, with no energy to do anything and experiencing dizziness. She mentioned that she always feels this way in the morning but starts to feel a lot better in the afternoon. She also reported occasionally feeling lightheaded. She would like to ask Dr. Wyline Mood and [place] if she needs to start a low dose of her Midodrine again.

## 2023-05-31 NOTE — Telephone Encounter (Signed)
   Yes, we discussed this at the time of her last visit but she wished to hold off at that time as she previously had elevated BP with Midodrine but that was with 5mg  TID. I would recommend she initially try 2.5mg  BID and we can titrate to 5mg  BID if needed based off BP readings and her symptoms.   Signed, Ellsworth Lennox, PA-C 05/31/2023, 7:00 PM

## 2023-05-31 NOTE — Telephone Encounter (Signed)
I will forward to B.Ahmed Prima, PA-C for review.

## 2023-06-01 MED ORDER — MIDODRINE HCL 2.5 MG PO TABS: 2.5000 mg | ORAL_TABLET | Freq: Two times a day (BID) | ORAL | 1 refills | Status: DC

## 2023-06-01 NOTE — Telephone Encounter (Signed)
Patient will start midodrine 2.5 mg bid and monitor her BP and symptoms. She states she will keep [place] updated.

## 2023-06-04 ENCOUNTER — Other Ambulatory Visit: Payer: Self-pay | Admitting: Family Medicine

## 2023-08-11 ENCOUNTER — Other Ambulatory Visit (HOSPITAL_COMMUNITY): Payer: Self-pay | Admitting: Family Medicine

## 2023-08-11 DIAGNOSIS — Z1231 Encounter for screening mammogram for malignant neoplasm of breast: Secondary | ICD-10-CM

## 2023-08-11 DIAGNOSIS — H04123 Dry eye syndrome of bilateral lacrimal glands: Secondary | ICD-10-CM | POA: Diagnosis not present

## 2023-08-27 ENCOUNTER — Ambulatory Visit (INDEPENDENT_AMBULATORY_CARE_PROVIDER_SITE_OTHER): Payer: Medicare Other | Admitting: Family Medicine

## 2023-08-27 ENCOUNTER — Other Ambulatory Visit: Payer: Self-pay | Admitting: Cardiology

## 2023-08-27 VITALS — BP 98/54 | Ht 62.0 in | Wt 105.6 lb

## 2023-08-27 DIAGNOSIS — Z79891 Long term (current) use of opiate analgesic: Secondary | ICD-10-CM

## 2023-08-27 DIAGNOSIS — I10 Essential (primary) hypertension: Secondary | ICD-10-CM | POA: Diagnosis not present

## 2023-08-27 DIAGNOSIS — D539 Nutritional anemia, unspecified: Secondary | ICD-10-CM | POA: Diagnosis not present

## 2023-08-27 DIAGNOSIS — Z5181 Encounter for therapeutic drug level monitoring: Secondary | ICD-10-CM | POA: Diagnosis not present

## 2023-08-27 MED ORDER — HYDROCODONE-ACETAMINOPHEN 10-325 MG PO TABS: ORAL_TABLET | ORAL | 0 refills | Status: DC

## 2023-08-27 NOTE — Progress Notes (Unsigned)
   Subjective:    Patient ID: [name], female    DOB: 03/08/1941, 83 y.o.   MRN: [id number]  Discussed the use of AI scribe software for clinical note transcription with the patient, who gave verbal consent to proceed.  History of Present Illne

## 2023-08-28 LAB — CBC WITH DIFFERENTIAL/PLATELET
Basophils Absolute: 0 10*3/uL (ref 0.0–0.2)
Basos: 0 %
EOS (ABSOLUTE): 0.2 10*3/uL (ref 0.0–0.4)
Eos: 2 %
Hematocrit: 34.7 % (ref 34.0–46.6)
Hemoglobin: 11.5 g/dL (ref 11.1–15.9)
Immature Grans (Abs): 0 10*3/uL (ref 0.0–0.1)
Immature Granulocytes: 0 %
Lymphocytes Absolute: 3.3 10*3/uL — ABNORMAL HIGH (ref 0.7–3.1)
Lymphs: 36 %
MCH: 33.8 pg — ABNORMAL HIGH (ref 26.6–33.0)
MCHC: 33.1 g/dL (ref 31.5–35.7)
MCV: 102 fL — ABNORMAL HIGH (ref 79–97)
Monocytes Absolute: 0.6 10*3/uL (ref 0.1–0.9)
Monocytes: 7 %
Neutrophils Absolute: 5 10*3/uL (ref 1.4–7.0)
Neutrophils: 55 %
Platelets: 300 10*3/uL (ref 150–450)
RBC: 3.4 x10E6/uL — ABNORMAL LOW (ref 3.77–5.28)
RDW: 11.5 % — ABNORMAL LOW (ref 11.7–15.4)
WBC: 9.2 10*3/uL (ref 3.4–10.8)

## 2023-08-28 LAB — BASIC METABOLIC PANEL WITH GFR
BUN/Creatinine Ratio: 23 (ref 12–28)
BUN: 26 mg/dL (ref 8–27)
CO2: 22 mmol/L (ref 20–29)
Calcium: 9.8 mg/dL (ref 8.7–10.3)
Chloride: 107 mmol/L — ABNORMAL HIGH (ref 96–106)
Creatinine, Ser: 1.14 mg/dL — ABNORMAL HIGH (ref 0.57–1.00)
Glucose: 100 mg/dL — ABNORMAL HIGH (ref 70–99)
Potassium: 4.6 mmol/L (ref 3.5–5.2)
Sodium: 143 mmol/L (ref 134–144)
eGFR: 49 mL/min/{1.73_m2} — ABNORMAL LOW (ref >59)

## 2023-08-29 ENCOUNTER — Encounter: Payer: Self-pay | Admitting: Family Medicine

## 2023-08-29 NOTE — Progress Notes (Signed)
 Please mail to patient she does not consistently use MyChart

## 2023-08-31 ENCOUNTER — Other Ambulatory Visit: Payer: Self-pay

## 2023-08-31 DIAGNOSIS — I10 Essential (primary) hypertension: Secondary | ICD-10-CM

## 2023-08-31 DIAGNOSIS — R7303 Prediabetes: Secondary | ICD-10-CM

## 2023-08-31 NOTE — Progress Notes (Signed)
 Printed and mailed to patient with instructions

## 2023-09-02 ENCOUNTER — Other Ambulatory Visit: Payer: Self-pay | Admitting: Family Medicine

## 2023-09-08 DIAGNOSIS — H04123 Dry eye syndrome of bilateral lacrimal glands: Secondary | ICD-10-CM | POA: Diagnosis not present

## 2023-09-15 ENCOUNTER — Encounter (HOSPITAL_COMMUNITY): Payer: Self-pay

## 2023-09-15 ENCOUNTER — Ambulatory Visit (HOSPITAL_COMMUNITY)
Admission: RE | Admit: 2023-09-15 | Discharge: 2023-09-15 | Disposition: A | Discharge status: Home / Self Care | Source: Ambulatory Visit | Attending: Family Medicine | Admitting: Family Medicine

## 2023-09-15 DIAGNOSIS — Z1231 Encounter for screening mammogram for malignant neoplasm of breast: Secondary | ICD-10-CM | POA: Insufficient documentation

## 2023-09-16 ENCOUNTER — Telehealth: Payer: Self-pay | Admitting: Family Medicine

## 2023-09-16 NOTE — Telephone Encounter (Signed)
 LMTCB

## 2023-09-16 NOTE — Telephone Encounter (Signed)
 Nurses Please touch base with patient Let her know that I did communicate with cardiology-Brittany Finis Hugger See how the patient is doing regarding the low blood pressures If she is still having significant dizziness with standing and feeling lightheaded we can adjust her midodrine  She is currently on 2.5 mg twice daily  We could change that to 2.5 mg every 8 hours Then to have her do a follow-up visit with myself or Courtney in the next 2 to 3 weeks to check blood pressure sitting and standing If she feels she is doing all right and does not want to change things we can keep as is and she can let us  know if any problems

## 2023-09-17 NOTE — Telephone Encounter (Signed)
 Patient called back and I was able to get her connected.

## 2023-09-17 NOTE — Telephone Encounter (Signed)
 Pt returned the call and My chart message regarding low bp readings has been relayed. She states she is keeping track of her bp's at home and only feeling light headed to 2 to 3 times per week for a few seconds each. She feels so far she is alright and will continue as is and follow up as needed.

## 2023-10-20 ENCOUNTER — Ambulatory Visit: Payer: Medicare Other | Attending: Cardiology | Admitting: Cardiology

## 2023-10-20 ENCOUNTER — Encounter: Payer: Self-pay | Admitting: Cardiology

## 2023-10-20 VITALS — BP 110/60 | HR 60 | Ht 62.0 in | Wt 105.2 lb

## 2023-10-20 DIAGNOSIS — I480 Paroxysmal atrial fibrillation: Secondary | ICD-10-CM | POA: Diagnosis not present

## 2023-10-20 DIAGNOSIS — I951 Orthostatic hypotension: Secondary | ICD-10-CM

## 2023-10-20 MED ORDER — MIDODRINE HCL 2.5 MG PO TABS: 2.5000 mg | ORAL_TABLET | Freq: Three times a day (TID) | ORAL | 1 refills | Status: DC

## 2023-10-20 NOTE — Progress Notes (Signed)
 Clinical Summary Ms. Malanowski is a 83 y.o.female seen today for follow up of the following medical problems.    1. Palpitations/Dizziness/Orthostatic hypotension Prior Home orthostastics Lying down: 128/59 p 56 Sitting 115/62 p 60 Standing 95/59 p 70        - some recent low bp's at times, dizziness. Often with standing - 32 oz of water  daily - previously on midodrine , appears stopped since our last visit for high bp's. Was on 5mg  tid.    - some lightheadedness/dizziness at times. Sporadic when it occurs. Rare low bp's at home SBP to 80s - home bp's variable,  - wearing compression stocks, Working on hydration but drinks only 36 oz of water  daily.      2. Afib   - has done well on flecanide for several years - 08/2018 monitor without afib   - recent palpitations with activities, + SOB   - no palpitations - no bleeding on eliquis .      3. Atypical chest pain - long history, prior ischemic testing has been negative   - seen 08/2017 by PA Strader for atypical chest pain. - 08/2017 nuclear stress negative.  -no recent symptoms   4. Hyperlipidemia 02/2023 TC 163 TG 98 HDL 63 LDL 82 Past Medical History:  Diagnosis Date   Adenocarcinoma, colon (HCC) 03/2005   Stage I; resected in 03/2005   Arthritis    Colon cancer (HCC)    Removed surgicaly    Dysrhythmia    Gastroesophageal reflux disease    Gastroparesis    Hearing loss    mild   Hyperlipidemia    Irregular heartbeat    Ovarian cyst 2008   Paroxysmal atrial fibrillation (HCC)    Palpitations; maintained on flecainide ; -normal coronary angiography and 1999; negative stress nuclear study in 11/2005   PONV (postoperative nausea and vomiting)    Tobacco abuse, in remission    10-pack-years; quit in 2001     No Known Allergies   Current Outpatient Medications  Medication Sig Dispense Refill   alendronate  (FOSAMAX ) 70 MG tablet TAKE ONE TABLET (70 MG TOTAL) BY MOUTH EVERY 7 DAYS. TAKE WITH A FULL GLASS OF  WATER  ON AN EMPTY STOMACH. 4 tablet 5   apixaban  (ELIQUIS ) 2.5 MG TABS tablet Take 1 tablet (2.5 mg total) by mouth 2 (two) times daily. 60 tablet 11   Calcium  Carbonate (CALCIUM  600 PO) Take 600 mg by mouth daily.     CONSTULOSE  10 GM/15ML solution TAKE 30ML'S (20 GRAMS TOTAL) BY MOUTH ATBEDTIME 946 mL 3   ferrous sulfate 325 (65 FE) MG tablet Take 325 mg by mouth daily with breakfast.     flecainide  (TAMBOCOR ) 100 MG tablet TAKE ONE TABLET (100MG  TOTAL) BY MOUTH EVERY 12 HOURS 60 tablet 8   HYDROcodone -acetaminophen  (NORCO) 10-325 MG tablet One qid prn pain 120 tablet 0   HYDROcodone -acetaminophen  (NORCO) 10-325 MG tablet 1 taken 4 times daily as needed for pain 120 tablet 0   HYDROcodone -acetaminophen  (NORCO) 10-325 MG tablet One qid prn pain 120 tablet 0   midodrine  (PROAMATINE ) 2.5 MG tablet Take 1 tablet (2.5 mg total) by mouth 2 (two) times daily with a meal. 180 tablet 1   omeprazole (PRILOSEC) 20 MG capsule Take 20 mg by mouth daily before breakfast.      rosuvastatin  (CRESTOR ) 40 MG tablet TAKE ONE TABLET (40MG  TOTAL) BY MOUTH DAILY 90 tablet 3   vitamin B-12 (CYANOCOBALAMIN ) 1000 MCG tablet Take 1,000 mcg by mouth daily.  No current facility-administered medications for this visit.     Past Surgical History:  Procedure Laterality Date   ABDOMINAL HYSTERECTOMY  1983   BACK SURGERY     BIOPSY  10/18/2017   Procedure: BIOPSY;  Surgeon: Ruby Corporal, MD;  Location: [facility];  Service: Endoscopy;;  gastric   BIOPSY  07/06/2018   Procedure: BIOPSY;  Surgeon: Ruby Corporal, MD;  Location: [facility];  Service: Endoscopy;;  gastric   CATARACT EXTRACTION W/PHACO Right 12/22/2021   Procedure: CATARACT EXTRACTION PHACO AND INTRAOCULAR LENS PLACEMENT (IOC);  Surgeon: Tarri Farm, MD;  Location: [facility];  Service: Ophthalmology;  Laterality: Right;  CDE: 13.86   CATARACT EXTRACTION W/PHACO Left 01/05/2022   Procedure: CATARACT EXTRACTION PHACO AND INTRAOCULAR LENS PLACEMENT  (IOC);  Surgeon: Tarri Farm, MD;  Location: [facility];  Service: Ophthalmology;  Laterality: Left;  CDE 21.39   CHOLECYSTECTOMY     COLON SURGERY  2006   Colon cancer   COLONOSCOPY  03/25/2012   Ruby Corporal, MD   COLONOSCOPY N/A 04/26/2017   Procedure: COLONOSCOPY;  Surgeon: Ruby Corporal, MD;  Location: [facility];  Service: Endoscopy;  Laterality: N/A;  2:00   COLONOSCOPY N/A 07/06/2018   Procedure: COLONOSCOPY;  Surgeon: Ruby Corporal, MD;  Location: [facility];  Service: Endoscopy;  Laterality: N/A;  10:30   COLONOSCOPY WITH PROPOFOL  N/A 09/10/2021   Procedure: COLONOSCOPY WITH PROPOFOL ;  Surgeon: Ruby Corporal, MD;  [facility]: [facility];  Service: Endoscopy;  Laterality: N/A;  910   COSMETIC SURGERY  09/2007   ESOPHAGOGASTRODUODENOSCOPY N/A 10/18/2017   Procedure: ESOPHAGOGASTRODUODENOSCOPY (EGD);  Surgeon: Ruby Corporal, MD;  [facility]: [facility];  Service: Endoscopy;  Laterality: N/A;   ESOPHAGOGASTRODUODENOSCOPY N/A 07/06/2018   Procedure: ESOPHAGOGASTRODUODENOSCOPY (EGD);  Surgeon: Ruby Corporal, MD;  [facility]: [facility];  Service: Endoscopy;  Laterality: N/A;   GIVENS CAPSULE STUDY N/A 12/26/2019   Procedure: GIVENS CAPSULE STUDY;  Surgeon: Ruby Corporal, MD;  [facility]: [facility];  Service: Endoscopy;  Laterality: N/A;  730   PARTIAL COLECTOMY  2006   adenocarcinoma   POLYPECTOMY  04/26/2017   Procedure: POLYPECTOMY;  Surgeon: Ruby Corporal, MD;  [facility];  Service: Endoscopy;;  colon   POLYPECTOMY  07/06/2018   Procedure: POLYPECTOMY;  Surgeon: Ruby Corporal, MD;  [facility];  Service: Endoscopy;;  colon    POLYPECTOMY  09/10/2021   Procedure: POLYPECTOMY;  Surgeon: Ruby Corporal, MD;  [facility]: [facility];  Service: Endoscopy;;  transverse x2;sigmoid   TONSILLECTOMY       No Known Allergies    Family History  Problem Relation Age of Onset   Heart disease Mother    Stroke Mother    Heart attack  Mother    Cancer Father        lung   Heart disease Father    Hypertension Father    Cancer Brother        rectal   Healthy Son    Heart failure Sister    Stroke Sister      Social History Ms. Leathers reports that she quit smoking about 21 years ago. Her smoking use included cigarettes. She started smoking about 60 years ago. She has a 12 pack-year smoking history. She has never used smokeless tobacco. Ms. Moede reports no history of alcohol use.     Physical Examination Today's Vitals   10/20/23 1536  BP: 110/60  Pulse: 60  SpO2: 97%  Weight: 105 lb 3.2 oz (47.7 kg)  Height: 5' 2 (1.575 m)   Body mass index is 19.24 kg/m.  Gen: resting comfortably, no acute distress HEENT: no scleral icterus, pupils equal round and reactive, no palptable cervical adenopathy,  CV: RRR, no m/rg, no jvd Resp: Clear to auscultation bilaterally GI: abdomen is soft, non-tender, non-distended, normal bowel sounds, no hepatosplenomegaly MSK: extremities are warm, no edema.  Skin: warm, no rash Neuro:  no focal deficits Psych: appropriate affect   Diagnostic Studies  08/2018 holter monitor 48 hr holter monitor Min HR 50, Max HR 103, Avg HR 68 Rare ventricular ectopy, 2 isolated PVCs Rare supraventricular ectopy. Primarily PACs. Three short runs of atach, longest 4 beats. Reported symptoms correlated with sinus rhythm   Assessment and Plan   1. Dizziness/palpitations/Orthostatic hypotension - intermittent low bp's, dizziness at times - improving with midodrine , compression stockings. We discussed again working on increasing hydration, increasing electrolyte/sodium intake - increase midodrine  to tid, continue to monitor       2. Afib/acquired thrombophlia -no symptoms, contnue current med    Laurann Pollock, M.D.

## 2023-10-20 NOTE — Patient Instructions (Signed)
 Medication Instructions:   INCREASE Midodrine  to 2.5 mg THREE times a day  Labwork: None today  Testing/Procedures: None today  Follow-Up: 4 months  Any Other Special Instructions Will Be Listed Below (If Applicable).  If you need a refill on your cardiac medications before your next appointment, please call your pharmacy.

## 2023-11-17 ENCOUNTER — Ambulatory Visit: Admitting: Family Medicine

## 2023-11-20 ENCOUNTER — Emergency Department (HOSPITAL_COMMUNITY)
Admission: EM | Admit: 2023-11-20 | Discharge: 2023-11-20 | Disposition: A | Discharge status: Home / Self Care | Attending: Emergency Medicine | Admitting: Emergency Medicine

## 2023-11-20 ENCOUNTER — Emergency Department (HOSPITAL_COMMUNITY)

## 2023-11-20 ENCOUNTER — Other Ambulatory Visit: Payer: Self-pay

## 2023-11-20 ENCOUNTER — Encounter (HOSPITAL_COMMUNITY): Payer: Self-pay

## 2023-11-20 DIAGNOSIS — Z7901 Long term (current) use of anticoagulants: Secondary | ICD-10-CM | POA: Insufficient documentation

## 2023-11-20 DIAGNOSIS — R0789 Other chest pain: Secondary | ICD-10-CM | POA: Insufficient documentation

## 2023-11-20 DIAGNOSIS — I700 Atherosclerosis of aorta: Secondary | ICD-10-CM | POA: Diagnosis not present

## 2023-11-20 DIAGNOSIS — N644 Mastodynia: Secondary | ICD-10-CM | POA: Diagnosis not present

## 2023-11-20 DIAGNOSIS — R079 Chest pain, unspecified: Secondary | ICD-10-CM | POA: Diagnosis not present

## 2023-11-20 DIAGNOSIS — Q630 Accessory kidney: Secondary | ICD-10-CM | POA: Diagnosis not present

## 2023-11-20 DIAGNOSIS — Q600 Renal agenesis, unilateral: Secondary | ICD-10-CM | POA: Diagnosis not present

## 2023-11-20 LAB — COMPREHENSIVE METABOLIC PANEL WITH GFR
ALT: 14 U/L (ref 0–44)
AST: 18 U/L (ref 15–41)
Albumin: 3.7 g/dL (ref 3.5–5.0)
Alkaline Phosphatase: 39 U/L (ref 38–126)
Anion gap: 11 (ref 5–15)
BUN: 15 mg/dL (ref 8–23)
CO2: 24 mmol/L (ref 22–32)
Calcium: 8.7 mg/dL — ABNORMAL LOW (ref 8.9–10.3)
Chloride: 102 mmol/L (ref 98–111)
Creatinine, Ser: 0.88 mg/dL (ref 0.44–1.00)
GFR, Estimated: >60 mL/min (ref >60)
Glucose, Bld: 90 mg/dL (ref 70–99)
Potassium: 4.1 mmol/L (ref 3.5–5.1)
Sodium: 137 mmol/L (ref 135–145)
Total Bilirubin: 0.5 mg/dL (ref 0.0–1.2)
Total Protein: 6.3 g/dL — ABNORMAL LOW (ref 6.5–8.1)

## 2023-11-20 LAB — CBC WITH DIFFERENTIAL/PLATELET
Abs Immature Granulocytes: 0.03 K/uL (ref 0.00–0.07)
Basophils Absolute: 0 K/uL (ref 0.0–0.1)
Basophils Relative: 0 %
Eosinophils Absolute: 0.2 K/uL (ref 0.0–0.5)
Eosinophils Relative: 2 %
HCT: 32.9 % — ABNORMAL LOW (ref 36.0–46.0)
Hemoglobin: 10.6 g/dL — ABNORMAL LOW (ref 12.0–15.0)
Immature Granulocytes: 0 %
Lymphocytes Relative: 30 %
Lymphs Abs: 2.1 K/uL (ref 0.7–4.0)
MCH: 33.2 pg (ref 26.0–34.0)
MCHC: 32.2 g/dL (ref 30.0–36.0)
MCV: 103.1 fL — ABNORMAL HIGH (ref 80.0–100.0)
Monocytes Absolute: 0.7 K/uL (ref 0.1–1.0)
Monocytes Relative: 9 %
Neutro Abs: 4.2 K/uL (ref 1.7–7.7)
Neutrophils Relative %: 59 %
Platelets: 261 K/uL (ref 150–400)
RBC: 3.19 MIL/uL — ABNORMAL LOW (ref 3.87–5.11)
RDW: 11.6 % (ref 11.5–15.5)
WBC: 7.2 K/uL (ref 4.0–10.5)
nRBC: 0 % (ref 0.0–0.2)

## 2023-11-20 LAB — TROPONIN I (HIGH SENSITIVITY)
Troponin I (High Sensitivity): 4 ng/L (ref <18)
Troponin I (High Sensitivity): 4 ng/L (ref <18)

## 2023-11-20 LAB — LIPASE, BLOOD: Lipase: 41 U/L (ref 11–51)

## 2023-11-20 MED ORDER — ALUM & MAG HYDROXIDE-SIMETH 200-200-20 MG/5ML PO SUSP
30.0000 mL | Freq: Once | ORAL | Status: AC
  Administered 2023-11-20: 30 mL via ORAL
  Filled 2023-11-20: qty 30

## 2023-11-20 MED ORDER — IOHEXOL 350 MG/ML SOLN
100.0000 mL | Freq: Once | INTRAVENOUS | Status: AC | PRN
  Administered 2023-11-20: 100 mL via INTRAVENOUS

## 2023-11-20 NOTE — ED Triage Notes (Addendum)
 Pt states that she started having right sided breast pain that goes into back. Describes pain as squeezing. Endorses burping.

## 2023-11-20 NOTE — ED Provider Notes (Signed)
 [place] EMERGENCY DEPARTMENT AT [facility] Provider Note   CSN: [id number] Arrival date & time: 11/20/23  0354     Patient presents with: No chief complaint on file.   [name] is a 83 y.o. female.   Presents to the urgency department for evaluation of chest pain.  Patient reports that she woke up around 3 AM to go to the bathroom and noticed that she was having pain in the right breast area.  Patient reports that it is a squeezing and heaviness that is under the breast and goes into the back.  The breast is sore to the touch.       Prior to Admission medications   Medication Sig Start Date End Date Taking? Authorizing Provider  alendronate  (FOSAMAX ) 70 MG tablet TAKE ONE TABLET (70 MG TOTAL) BY MOUTH EVERY 7 DAYS. TAKE WITH A FULL GLASS OF WATER  ON AN EMPTY STOMACH. 09/03/23   Alphonsa Glendia LABOR, MD  apixaban  (ELIQUIS ) 2.5 MG TABS tablet Take 1 tablet (2.5 mg total) by mouth 2 (two) times daily. 03/03/23   Alvan Dorn FALCON, MD  Calcium  Carbonate (CALCIUM  600 PO) Take 600 mg by mouth daily.    [provider]  CONSTULOSE  10 GM/15ML solution TAKE 30ML'S (20 GRAMS TOTAL) BY MOUTH ATBEDTIME 06/04/23   Alphonsa Glendia LABOR, MD  ferrous sulfate 325 (65 FE) MG tablet Take 325 mg by mouth daily with breakfast.    [provider]  flecainide  (TAMBOCOR ) 100 MG tablet TAKE ONE TABLET (100MG  TOTAL) BY MOUTH EVERY 12 HOURS 08/27/23   Alvan Dorn FALCON, MD  HYDROcodone -acetaminophen  [facility]) 10-325 MG tablet One qid prn pain 08/27/23   Alphonsa Glendia LABOR, MD  HYDROcodone -acetaminophen  [facility]) 10-325 MG tablet 1 taken 4 times daily as needed for pain 08/27/23   Alphonsa Glendia LABOR, MD  HYDROcodone -acetaminophen  (NORCO) 10-325 MG tablet One qid prn pain 08/27/23   Alphonsa Glendia LABOR, MD  midodrine  (PROAMATINE ) 2.5 MG tablet Take 1 tablet (2.5 mg total) by mouth 3 (three) times daily with meals. 10/20/23   Alvan Dorn FALCON, MD  omeprazole (PRILOSEC) 20 MG capsule Take 20 mg by mouth  daily before breakfast.     [provider]  rosuvastatin  (CRESTOR ) 40 MG tablet TAKE ONE TABLET (40MG  TOTAL) BY MOUTH DAILY 03/19/23   Alvan Dorn FALCON, MD  vitamin B-12 (CYANOCOBALAMIN ) 1000 MCG tablet Take 1,000 mcg by mouth daily.    [provider]    Allergies: Patient has no known allergies.    Review of Systems  Updated Vital Signs BP (!) 133/99   Pulse 64   Resp 15   SpO2 97%   Physical Exam Vitals and nursing note reviewed.  Constitutional:      General: She is not in acute distress.    Appearance: She is well-developed.  HENT:     Head: Normocephalic and atraumatic.     Mouth/Throat:     Mouth: Mucous membranes are moist.  Eyes:     General: Vision grossly intact. Gaze aligned appropriately.     Extraocular Movements: Extraocular movements intact.     Conjunctiva/sclera: Conjunctivae normal.  Cardiovascular:     Rate and Rhythm: Normal rate and regular rhythm.     Pulses: Normal pulses.     Heart sounds: Normal heart sounds, S1 normal and S2 normal. No murmur heard.    No friction rub. No gallop.  Pulmonary:     Effort: Pulmonary effort is normal. No respiratory distress.  Breath sounds: Normal breath sounds.  Chest:     Chest wall: Tenderness present.  Abdominal:     General: Bowel sounds are normal.     Palpations: Abdomen is soft.     Tenderness: There is no abdominal tenderness. There is no guarding or rebound.     Hernia: No hernia is present.  Musculoskeletal:        General: No swelling.     Cervical back: Full passive range of motion without pain, normal range of motion and neck supple. No spinous process tenderness or muscular tenderness. Normal range of motion.     Right lower leg: No edema.     Left lower leg: No edema.  Skin:    General: Skin is warm and dry.     Capillary Refill: Capillary refill takes less than 2 seconds.     Findings: No ecchymosis, erythema, rash or wound.  Neurological:     General: No focal  deficit present.     Mental Status: She is alert and oriented to person, place, and time.     GCS: GCS eye subscore is 4. GCS verbal subscore is 5. GCS motor subscore is 6.     Cranial Nerves: Cranial nerves 2-12 are intact.     Sensory: Sensation is intact.     Motor: Motor function is intact.     Coordination: Coordination is intact.  Psychiatric:        Attention and Perception: Attention normal.        Mood and Affect: Mood normal.        Speech: Speech normal.        Behavior: Behavior normal.     (all labs ordered are listed, but only abnormal results are displayed) Labs Reviewed  CBC WITH DIFFERENTIAL/PLATELET - Abnormal; Notable for the following components:      Result Value   RBC 3.19 (*)    Hemoglobin 10.6 (*)    HCT 32.9 (*)    MCV 103.1 (*)    All other components within normal limits  COMPREHENSIVE METABOLIC PANEL WITH GFR - Abnormal; Notable for the following components:   Calcium  8.7 (*)    Total Protein 6.3 (*)    All other components within normal limits  LIPASE, BLOOD  TROPONIN I (HIGH SENSITIVITY)  TROPONIN I (HIGH SENSITIVITY)    EKG: None  Radiology: CT ANGIO CHEST/ABD/PEL FOR DISSECTION W &/OR WO CONTRAST Result Date: 11/20/2023 CLINICAL DATA:  Acute aortic syndrome suspected, right-sided breast pain radiating to back EXAM: CT ANGIOGRAPHY CHEST, ABDOMEN AND PELVIS TECHNIQUE: Non-contrast CT of the chest was initially obtained. Multidetector CT imaging through the chest, abdomen and pelvis was performed using the standard protocol during bolus administration of intravenous contrast. Multiplanar reconstructed images and MIPs were obtained and reviewed to evaluate the vascular anatomy. RADIATION DOSE REDUCTION: This exam was performed according to the departmental dose-optimization program which includes automated exposure control, adjustment of the mA and/or kV according to patient size and/or use of iterative reconstruction technique. CONTRAST:   OMNIPAQUE  IOHEXOL  350 MG/ML SOLN COMPARISON:  CT chest, 05/10/2023, CT abdomen pelvis, 06/08/2018 FINDINGS: CTA CHEST FINDINGS VASCULAR Aorta: Satisfactory opacification of the aorta. Normal contour and caliber of the thoracic aorta. No evidence of aneurysm, dissection, or other acute aortic pathology. Moderate aortic atherosclerosis. Cardiovascular: No evidence of pulmonary embolism on limited non-tailored examination. Normal heart size. No pericardial effusion. Review of the MIP images confirms the above findings. NON VASCULAR Mediastinum/Nodes: No enlarged mediastinal, hilar, or axillary  lymph nodes. Thyroid  gland, trachea, and esophagus demonstrate no significant findings. Lungs/Pleura: Mild paraseptal emphysema. Diffuse bilateral bronchial wall thickening. Biapical pleural-parenchymal scarring. No pleural effusion or pneumothorax. Musculoskeletal: No chest wall abnormality. No acute osseous findings. Review of the MIP images confirms the above findings. CTA ABDOMEN AND PELVIS FINDINGS VASCULAR Normal contour and caliber of the abdominal aorta. Severe mixed aortic atherosclerosis. No evidence of aneurysm, dissection, or other acute aortic pathology. 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 Review of the MIP images confirms the above findings. NON-VASCULAR Hepatobiliary: No focal liver abnormality is seen. Status post cholecystectomy. Unchanged mild postoperative biliary ductal dilatation. Pancreas: Unremarkable. No pancreatic ductal dilatation or surrounding inflammatory changes. Spleen: Normal in size without significant abnormality. Adrenals/Urinary Tract: Adrenal glands are unremarkable. Kidneys are normal, without renal calculi, solid lesion, or hydronephrosis. Bladder is unremarkable. Stomach/Bowel: Stomach is within normal limits. Appendix appears normal. No evidence of bowel wall thickening, distention, or  inflammatory changes. Lymphatic: No enlarged abdominal or pelvic lymph nodes. Reproductive: Status post hysterectomy. Cystic lesion of the right ovary measuring 4.1 x 3.2 cm, unchanged and benign requiring no further follow-up or characterization. Other: No abdominal wall hernia or abnormality. No ascites. Musculoskeletal: No acute osseous findings. IMPRESSION: 1. Normal contour and caliber of the thoracic and abdominal aorta. 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 No evidence of aneurysm, dissection, or other acute aortic pathology. 2. Mild paraseptal emphysema and diffuse bilateral bronchial wall thickening. 3. Status post cholecystectomy and hysterectomy. Aortic Atherosclerosis (ICD10-I70.0) and Emphysema (ICD10-J43.9). Electronically Signed   By: Marolyn JONETTA Jaksch M.D.   On: 11/20/2023 06:23   DG Chest Port 1 View Result Date: 11/20/2023 EXAM: 1 VIEW XRAY OF THE CHEST 11/20/2023 04:41:00 AM COMPARISON: 2 view chest x-ray 02/15/2018. CLINICAL HISTORY: Chest pain. Per triage right sided breast pain that goes into back. Describes pain as squeezing. Endorses burping. FINDINGS: LUNGS AND PLEURA: No focal pulmonary opacity. No pulmonary edema. No pleural effusion. No pneumothorax. HEART AND MEDIASTINUM: No acute abnormality of the cardiac and mediastinal silhouettes. Atherosclerotic changes are present at the aortic arch. BONES AND SOFT TISSUES: No acute osseous abnormality. IMPRESSION: 1. No acute cardiopulmonary pathology related to the provided clinical history. 2. Atherosclerotic changes at the aortic arch. Electronically signed by: Lonni Necessary MD 11/20/2023 05:12 AM EDT RP Workstation: [id number]     Procedures   Medications Ordered in the ED  alum & mag hydroxide-simeth (MAALOX/MYLANTA) 200-200-20 MG/5ML suspension 30 mL (30 mLs Oral Given 11/20/23 0445)  iohexol  (OMNIPAQUE ) 350 MG/ML injection 100 mL (100 mLs Intravenous Contrast Given 11/20/23 0609)                                     Medical Decision Making Amount and/or Complexity of Data Reviewed Labs: ordered. Radiology: ordered.  Risk OTC drugs. Prescription drug management.   Differential Diagnosis considered includes, but not limited to: STEMI; NSTEMI; myocarditis; pericarditis; pulmonary embolism; aortic dissection; pneumothorax; pneumonia; gastritis; musculoskeletal pain  Presents to the Emergency Department for evaluation of chest pain.  Patient reports that she woke up from sleep and felt pain in the area of the right breast.  No history of coronary artery disease.  She is on Eliquis  secondary to P A-fib.  No shortness of breath or pleuritic pain.  Feel PE is low likelihood.  Pain does radiate to the back.  EKG without  ischemic changes.  Troponin negative x 2.  Patient underwent CT angiography with no evidence of aortic syndrome or other acute pathology.  Patient did have resolution of her symptoms with Maalox.  She does have a history of reflux, will continue her Prilosec, follow-up with primary care.     Final diagnoses:  Atypical chest pain    ED Discharge Orders     None          Haze Lonni PARAS, MD 11/20/23 [phone]

## 2023-12-02 ENCOUNTER — Other Ambulatory Visit: Payer: Self-pay | Admitting: Family Medicine

## 2023-12-06 DIAGNOSIS — R7303 Prediabetes: Secondary | ICD-10-CM | POA: Diagnosis not present

## 2023-12-06 DIAGNOSIS — I10 Essential (primary) hypertension: Secondary | ICD-10-CM | POA: Diagnosis not present

## 2023-12-07 LAB — BASIC METABOLIC PANEL WITH GFR
BUN/Creatinine Ratio: 21 (ref 12–28)
BUN: 24 mg/dL (ref 8–27)
CO2: 23 mmol/L (ref 20–29)
Calcium: 9.7 mg/dL (ref 8.7–10.3)
Chloride: 101 mmol/L (ref 96–106)
Creatinine, Ser: 1.17 mg/dL — ABNORMAL HIGH (ref 0.57–1.00)
Glucose: 94 mg/dL (ref 70–99)
Potassium: 4.5 mmol/L (ref 3.5–5.2)
Sodium: 138 mmol/L (ref 134–144)
eGFR: 47 mL/min/1.73 — ABNORMAL LOW (ref >59)

## 2023-12-07 LAB — MICROALBUMIN / CREATININE URINE RATIO
Creatinine, Urine: 121.3 mg/dL
Microalb/Creat Ratio: 7 mg/g{creat} (ref 0–29)
Microalbumin, Urine: 8.5 ug/mL

## 2023-12-08 ENCOUNTER — Ambulatory Visit: Payer: Self-pay | Admitting: Family Medicine

## 2023-12-13 ENCOUNTER — Ambulatory Visit: Admitting: Nurse Practitioner

## 2023-12-13 ENCOUNTER — Other Ambulatory Visit: Payer: Self-pay

## 2023-12-13 ENCOUNTER — Encounter: Payer: Self-pay | Admitting: Nurse Practitioner

## 2023-12-13 VITALS — BP 110/62 | HR 61 | Ht 62.0 in | Wt 105.0 lb

## 2023-12-13 DIAGNOSIS — R7989 Other specified abnormal findings of blood chemistry: Secondary | ICD-10-CM

## 2023-12-13 DIAGNOSIS — M5441 Lumbago with sciatica, right side: Secondary | ICD-10-CM

## 2023-12-13 DIAGNOSIS — M47816 Spondylosis without myelopathy or radiculopathy, lumbar region: Secondary | ICD-10-CM

## 2023-12-13 DIAGNOSIS — G8929 Other chronic pain: Secondary | ICD-10-CM | POA: Diagnosis not present

## 2023-12-13 DIAGNOSIS — Z79891 Long term (current) use of opiate analgesic: Secondary | ICD-10-CM

## 2023-12-13 DIAGNOSIS — M5442 Lumbago with sciatica, left side: Secondary | ICD-10-CM | POA: Diagnosis not present

## 2023-12-13 DIAGNOSIS — I10 Essential (primary) hypertension: Secondary | ICD-10-CM

## 2023-12-13 DIAGNOSIS — Z5181 Encounter for therapeutic drug level monitoring: Secondary | ICD-10-CM | POA: Diagnosis not present

## 2023-12-13 MED ORDER — HYDROCODONE-ACETAMINOPHEN 10-325 MG PO TABS: ORAL_TABLET | ORAL | 0 refills | Status: DC

## 2023-12-13 NOTE — Progress Notes (Signed)
 Subjective:    Patient ID: [name], female    DOB: [birth year], 83 y.o.   MRN: [id number]  HPI This patient was seen today for chronic pain  The medication list was reviewed and updated.   Location of Pain for which the patient has been treated with regarding narcotics: lower back   Onset of this pain: 7 to 8 yrs ago after back surgery   -Compliance with medication: takes daily  - Number patient states they take daily: 4   -Reason for ongoing use of opioids has tried surg and injections  What other measures have been tried outside of opioids surgery , injections  In the ongoing specialists regarding this condition no, back doc retired  -when was the last dose patient took? 12 pm today  The patient was advised the importance of maintaining medication and not using illegal substances with these.  Here for refills and follow up  The patient was educated that we can provide 3 monthly scripts for their medication, it is their responsibility to follow the instructions.  Side effects or complications from medications: none  Patient is aware that pain medications are meant to minimize the severity of the pain to allow their pain levels to improve to allow for better function. They are aware of that pain medications cannot totally remove their pain.  Due for UDT ( at least once per year) (pain management contract is also completed at the time of the UDT): 2021  Scale of 1 to 10 ( 1 is least 10 is most) Your pain level without the medicine: 10 Your pain level with medication 4  Scale 1 to 10 ( 1-helps very little, 10 helps very well) How well does your pain medication reduce your pain so you can function better through out the day? 10  Quality of the pain: sharp pains, to dull pain   Persistence of the pain: persistent  Modifying factors: none States she could not function due to pain without medication. Able to do her daily activities.       Review of Systems   Respiratory:  Negative for cough, chest tightness and shortness of breath.   Cardiovascular:  Negative for chest pain and leg swelling.  Musculoskeletal:  Positive for back pain.      12/13/2023    1:16 PM  Depression screen PHQ 2/9  Decreased Interest 0  Down, Depressed, Hopeless 0  PHQ - 2 Score 0  Altered sleeping 0  Tired, decreased energy 0  Change in appetite 0  Feeling bad or failure about yourself  0  Trouble concentrating 0  Moving slowly or fidgety/restless 0  Suicidal thoughts 0  PHQ-9 Score 0  Difficult doing work/chores Not difficult at all      12/13/2023    1:17 PM 05/03/2023    4:03 PM 03/02/2023    1:15 PM 04/09/2022    3:10 PM  GAD 7 : Generalized Anxiety Score  Nervous, Anxious, on Edge 0 0 0 0  Control/stop worrying 0 0 0 0  Worry too much - different things 0 0 0 0  Trouble relaxing 0 0 0 0  Restless 0 0 0 0  Easily annoyed or irritable 0 0 0 0  Afraid - awful might happen 0 3 0 0  Total GAD 7 Score 0 3 0 0  Anxiety Difficulty Not difficult at all Not difficult at all  Not difficult at all    Social History   Tobacco Use  Smoking status: Former    Current packs/day: 0.00    Average packs/day: 0.3 packs/day for 40.0 years (12.0 ttl pk-yrs)    Types: Cigarettes    Start date: 02/23/1963    Quit date: 03/25/2002    Years since quitting: 21.7   Smokeless tobacco: Never   Tobacco comments:    Quit smoking in 2001  Vaping Use   Vaping status: Never Used  Substance Use Topics   Alcohol use: No    Alcohol/week: 0.0 standard drinks of alcohol   Drug use: No        Objective:   Physical Exam Vitals and nursing note reviewed.  Constitutional:      General: She is not in acute distress. Cardiovascular:     Rate and Rhythm: Normal rate and regular rhythm.  Pulmonary:     Effort: Pulmonary effort is normal.     Breath sounds: Normal breath sounds.  Neurological:     Mental Status: She is alert and oriented to person, place, and time.      Gait: Gait normal.     Comments: Gets on and off exam table slowly but without assistance.  Gait normal.  Psychiatric:        Mood and Affect: Mood normal.        Behavior: Behavior normal.        Thought Content: Thought content normal.        Judgment: Judgment normal.    Today's Vitals   12/13/23 1321  BP: 110/62  Pulse: 61  SpO2: 100%  Weight: 105 lb (47.6 kg)  Height: 5' 2 (1.575 m)   Body mass index is 19.2 kg/m.         Assessment & Plan:   Problem List Items Addressed This Visit       Nervous and Auditory   Chronic bilateral low back pain with sciatica   Relevant Medications   HYDROcodone -acetaminophen  (NORCO) 10-325 MG tablet   HYDROcodone -acetaminophen  (NORCO) 10-325 MG tablet   HYDROcodone -acetaminophen  (NORCO) 10-325 MG tablet     Musculoskeletal and Integument   Lumbar spondylosis     Other   Encounter for monitoring opioid maintenance therapy - Primary   Meds ordered this encounter  Medications   HYDROcodone -acetaminophen  (NORCO) 10-325 MG tablet    Sig: One qid prn pain    Dispense:  120 tablet    Refill:  0    Supervising Provider:   ALPHONSA HAMILTON A [[id number]]   HYDROcodone -acetaminophen  (NORCO) 10-325 MG tablet    Sig: 1 taken 4 times daily as needed for pain    Dispense:  120 tablet    Refill:  0    May fill 30 days from 12/13/23    Supervising Provider:   ALPHONSA HAMILTON A [[id number]]   HYDROcodone -acetaminophen  (NORCO) 10-325 MG tablet    Sig: One qid prn pain    Dispense:  120 tablet    Refill:  0    May fill 60 days from 12/13/23    Supervising Provider:   ALPHONSA HAMILTON A [phone]    Continue current regimen as directed. Reminded patient about repeat met 7 in 3 weeks to recheck kidney function. Controlled substances contract completed today.  Patient was unable to give enough urine sample for urine drug screening, this will be done at her next visit. Return in about 3 months (around 03/14/2024).

## 2023-12-21 ENCOUNTER — Ambulatory Visit: Admitting: Family Medicine

## 2023-12-29 DIAGNOSIS — R7989 Other specified abnormal findings of blood chemistry: Secondary | ICD-10-CM | POA: Diagnosis not present

## 2023-12-29 DIAGNOSIS — I10 Essential (primary) hypertension: Secondary | ICD-10-CM | POA: Diagnosis not present

## 2023-12-30 LAB — BASIC METABOLIC PANEL WITH GFR
BUN/Creatinine Ratio: 14 (ref 12–28)
BUN: 17 mg/dL (ref 8–27)
CO2: 24 mmol/L (ref 20–29)
Calcium: 9.7 mg/dL (ref 8.7–10.3)
Chloride: 98 mmol/L (ref 96–106)
Creatinine, Ser: 1.22 mg/dL — ABNORMAL HIGH (ref 0.57–1.00)
Glucose: 107 mg/dL — ABNORMAL HIGH (ref 70–99)
Potassium: 5.1 mmol/L (ref 3.5–5.2)
Sodium: 135 mmol/L (ref 134–144)
eGFR: 45 mL/min/1.73 — ABNORMAL LOW (ref >59)

## 2023-12-31 ENCOUNTER — Encounter: Payer: Self-pay | Admitting: Radiology

## 2024-01-03 ENCOUNTER — Telehealth: Payer: Self-pay | Admitting: Cardiology

## 2024-01-03 MED ORDER — MIDODRINE HCL 5 MG PO TABS: 5.0000 mg | ORAL_TABLET | Freq: Three times a day (TID) | ORAL | 11 refills | Status: DC

## 2024-01-03 NOTE — Telephone Encounter (Signed)
 Spoke with pt and she states that she is well hydrated and compliant with midodrine  2.5 mg three times daily. Pt agrees with increasing Midodrine  to 5 mg three times daily.

## 2024-01-03 NOTE — Telephone Encounter (Signed)
 Pt notified that msg has been sent to Dr. Alvan. Pt placed on the wait list for an appt.

## 2024-01-03 NOTE — Telephone Encounter (Signed)
 Is she working to stay very well hydrated? Compliant with the midodrine  2.5mg  three times daily? If yes to all the questions they would try increase the midodrine  to 5mg  three times daily  JINNY Ross MD

## 2024-01-03 NOTE — Telephone Encounter (Signed)
 Pt c/o BP issue: STAT if pt c/o blurred vision, one-sided weakness or slurred speech.  STAT if BP is GREATER than 180/120 TODAY.  STAT if BP is LESS than 90/60 and SYMPTOMATIC TODAY  1. What is your BP concern? Pt called in stating her bp is going up and down   2. Have you taken any BP medication today? Yes took around 6:30am   3. What are your last 5 BP readings?  79/51 - yesterday  97/58 - yesterday  86/51 - yesterday  100/58 - yesterday  101/64 - 9am this morning  138/77 - 9:30am this morning    4. Are you having any other symptoms (ex. Dizziness, headache, blurred vision, passed out)?  Dizzy

## 2024-01-04 ENCOUNTER — Ambulatory Visit: Payer: Self-pay | Admitting: Family Medicine

## 2024-01-06 ENCOUNTER — Telehealth: Payer: Self-pay | Admitting: Cardiology

## 2024-01-06 ENCOUNTER — Other Ambulatory Visit: Payer: Self-pay

## 2024-01-06 DIAGNOSIS — R7989 Other specified abnormal findings of blood chemistry: Secondary | ICD-10-CM

## 2024-01-06 NOTE — Telephone Encounter (Signed)
 Patient did not start increased dose of midodrine  until 2 days after the increase dose was called in.  I encouraged her to not miss any doses,not to stop without notifying cardiology and increase her fluid intake. She said she takes hydrocodone  3 times a day. I told her this can also sedate her and she may want to re-visit this with her pcp.She admits to not eating as well as she should and says she doesn't think she can increase her fluids anymore as she has to urinate often.   She will call us  back with update.

## 2024-01-06 NOTE — Telephone Encounter (Signed)
 Pt c/o medication issue:  1. Name of Medication:   midodrine  (PROAMATINE ) 5 MG tablet    2. How are you currently taking this medication (dosage and times per day)?    3. Are you having a reaction (difficulty breathing--STAT)? no  4. What is your medication issue? Patient states that she feels very fatigue, tiredness, lightheadedness and nausea  with this medication. Please advisei

## 2024-01-15 ENCOUNTER — Emergency Department (HOSPITAL_COMMUNITY)

## 2024-01-15 ENCOUNTER — Encounter (HOSPITAL_COMMUNITY): Payer: Self-pay | Admitting: Emergency Medicine

## 2024-01-15 ENCOUNTER — Other Ambulatory Visit: Payer: Self-pay

## 2024-01-15 ENCOUNTER — Inpatient Hospital Stay (HOSPITAL_COMMUNITY)
Admission: EM | Admit: 2024-01-15 | Discharge: 2024-01-27 | DRG: 287 | Disposition: A | Discharge status: Skilled Nursing Facility | Attending: Family Medicine | Admitting: Family Medicine

## 2024-01-15 DIAGNOSIS — E860 Dehydration: Secondary | ICD-10-CM | POA: Diagnosis not present

## 2024-01-15 DIAGNOSIS — S0181XA Laceration without foreign body of other part of head, initial encounter: Secondary | ICD-10-CM | POA: Diagnosis not present

## 2024-01-15 DIAGNOSIS — D72829 Elevated white blood cell count, unspecified: Secondary | ICD-10-CM | POA: Insufficient documentation

## 2024-01-15 DIAGNOSIS — M47812 Spondylosis without myelopathy or radiculopathy, cervical region: Secondary | ICD-10-CM | POA: Diagnosis not present

## 2024-01-15 DIAGNOSIS — Z8249 Family history of ischemic heart disease and other diseases of the circulatory system: Secondary | ICD-10-CM | POA: Diagnosis not present

## 2024-01-15 DIAGNOSIS — Z79899 Other long term (current) drug therapy: Secondary | ICD-10-CM | POA: Diagnosis not present

## 2024-01-15 DIAGNOSIS — I480 Paroxysmal atrial fibrillation: Secondary | ICD-10-CM | POA: Diagnosis present

## 2024-01-15 DIAGNOSIS — G8929 Other chronic pain: Secondary | ICD-10-CM | POA: Diagnosis present

## 2024-01-15 DIAGNOSIS — M810 Age-related osteoporosis without current pathological fracture: Secondary | ICD-10-CM | POA: Diagnosis present

## 2024-01-15 DIAGNOSIS — Z87891 Personal history of nicotine dependence: Secondary | ICD-10-CM

## 2024-01-15 DIAGNOSIS — M1990 Unspecified osteoarthritis, unspecified site: Secondary | ICD-10-CM | POA: Diagnosis present

## 2024-01-15 DIAGNOSIS — K219 Gastro-esophageal reflux disease without esophagitis: Secondary | ICD-10-CM | POA: Diagnosis present

## 2024-01-15 DIAGNOSIS — I472 Ventricular tachycardia, unspecified: Secondary | ICD-10-CM | POA: Diagnosis not present

## 2024-01-15 DIAGNOSIS — Z800 Family history of malignant neoplasm of digestive organs: Secondary | ICD-10-CM

## 2024-01-15 DIAGNOSIS — R55 Syncope and collapse: Secondary | ICD-10-CM | POA: Diagnosis not present

## 2024-01-15 DIAGNOSIS — I5033 Acute on chronic diastolic (congestive) heart failure: Secondary | ICD-10-CM | POA: Diagnosis not present

## 2024-01-15 DIAGNOSIS — N179 Acute kidney failure, unspecified: Secondary | ICD-10-CM | POA: Diagnosis not present

## 2024-01-15 DIAGNOSIS — T462X5A Adverse effect of other antidysrhythmic drugs, initial encounter: Secondary | ICD-10-CM | POA: Diagnosis not present

## 2024-01-15 DIAGNOSIS — Z751 Person awaiting admission to adequate facility elsewhere: Secondary | ICD-10-CM

## 2024-01-15 DIAGNOSIS — R54 Age-related physical debility: Secondary | ICD-10-CM | POA: Diagnosis present

## 2024-01-15 DIAGNOSIS — I4510 Unspecified right bundle-branch block: Secondary | ICD-10-CM | POA: Diagnosis not present

## 2024-01-15 DIAGNOSIS — E785 Hyperlipidemia, unspecified: Secondary | ICD-10-CM | POA: Diagnosis not present

## 2024-01-15 DIAGNOSIS — I4729 Other ventricular tachycardia: Secondary | ICD-10-CM | POA: Diagnosis not present

## 2024-01-15 DIAGNOSIS — Z79891 Long term (current) use of opiate analgesic: Secondary | ICD-10-CM

## 2024-01-15 DIAGNOSIS — Z823 Family history of stroke: Secondary | ICD-10-CM

## 2024-01-15 DIAGNOSIS — Z7901 Long term (current) use of anticoagulants: Secondary | ICD-10-CM | POA: Diagnosis not present

## 2024-01-15 DIAGNOSIS — E861 Hypovolemia: Secondary | ICD-10-CM | POA: Diagnosis not present

## 2024-01-15 DIAGNOSIS — R0902 Hypoxemia: Secondary | ICD-10-CM | POA: Diagnosis present

## 2024-01-15 DIAGNOSIS — Z043 Encounter for examination and observation following other accident: Secondary | ICD-10-CM | POA: Diagnosis not present

## 2024-01-15 DIAGNOSIS — E871 Hypo-osmolality and hyponatremia: Secondary | ICD-10-CM | POA: Diagnosis not present

## 2024-01-15 DIAGNOSIS — S8002XA Contusion of left knee, initial encounter: Secondary | ICD-10-CM | POA: Diagnosis present

## 2024-01-15 DIAGNOSIS — R296 Repeated falls: Secondary | ICD-10-CM | POA: Diagnosis present

## 2024-01-15 DIAGNOSIS — M4802 Spinal stenosis, cervical region: Secondary | ICD-10-CM | POA: Diagnosis not present

## 2024-01-15 DIAGNOSIS — I4891 Unspecified atrial fibrillation: Secondary | ICD-10-CM | POA: Diagnosis not present

## 2024-01-15 DIAGNOSIS — Z7983 Long term (current) use of bisphosphonates: Secondary | ICD-10-CM

## 2024-01-15 DIAGNOSIS — G9389 Other specified disorders of brain: Secondary | ICD-10-CM | POA: Diagnosis not present

## 2024-01-15 DIAGNOSIS — S0083XA Contusion of other part of head, initial encounter: Secondary | ICD-10-CM | POA: Diagnosis not present

## 2024-01-15 DIAGNOSIS — S01112A Laceration without foreign body of left eyelid and periocular area, initial encounter: Secondary | ICD-10-CM | POA: Diagnosis not present

## 2024-01-15 DIAGNOSIS — I498 Other specified cardiac arrhythmias: Secondary | ICD-10-CM | POA: Diagnosis present

## 2024-01-15 DIAGNOSIS — Z602 Problems related to living alone: Secondary | ICD-10-CM | POA: Diagnosis present

## 2024-01-15 DIAGNOSIS — D649 Anemia, unspecified: Secondary | ICD-10-CM | POA: Diagnosis present

## 2024-01-15 DIAGNOSIS — M25562 Pain in left knee: Secondary | ICD-10-CM | POA: Diagnosis not present

## 2024-01-15 DIAGNOSIS — I130 Hypertensive heart and chronic kidney disease with heart failure and stage 1 through stage 4 chronic kidney disease, or unspecified chronic kidney disease: Secondary | ICD-10-CM | POA: Diagnosis present

## 2024-01-15 DIAGNOSIS — I951 Orthostatic hypotension: Secondary | ICD-10-CM | POA: Diagnosis not present

## 2024-01-15 DIAGNOSIS — W19XXXA Unspecified fall, initial encounter: Secondary | ICD-10-CM | POA: Diagnosis not present

## 2024-01-15 DIAGNOSIS — E876 Hypokalemia: Secondary | ICD-10-CM | POA: Diagnosis not present

## 2024-01-15 DIAGNOSIS — I493 Ventricular premature depolarization: Secondary | ICD-10-CM | POA: Diagnosis not present

## 2024-01-15 DIAGNOSIS — M79602 Pain in left arm: Secondary | ICD-10-CM | POA: Diagnosis not present

## 2024-01-15 DIAGNOSIS — Z90710 Acquired absence of both cervix and uterus: Secondary | ICD-10-CM

## 2024-01-15 DIAGNOSIS — R079 Chest pain, unspecified: Secondary | ICD-10-CM | POA: Diagnosis not present

## 2024-01-15 DIAGNOSIS — S0990XA Unspecified injury of head, initial encounter: Secondary | ICD-10-CM | POA: Diagnosis not present

## 2024-01-15 DIAGNOSIS — K3184 Gastroparesis: Secondary | ICD-10-CM | POA: Diagnosis present

## 2024-01-15 DIAGNOSIS — N1831 Chronic kidney disease, stage 3a: Secondary | ICD-10-CM | POA: Diagnosis present

## 2024-01-15 DIAGNOSIS — R918 Other nonspecific abnormal finding of lung field: Secondary | ICD-10-CM | POA: Diagnosis not present

## 2024-01-15 DIAGNOSIS — Z85038 Personal history of other malignant neoplasm of large intestine: Secondary | ICD-10-CM

## 2024-01-15 DIAGNOSIS — Z9049 Acquired absence of other specified parts of digestive tract: Secondary | ICD-10-CM

## 2024-01-15 DIAGNOSIS — R220 Localized swelling, mass and lump, head: Secondary | ICD-10-CM | POA: Diagnosis not present

## 2024-01-15 DIAGNOSIS — S4990XA Unspecified injury of shoulder and upper arm, unspecified arm, initial encounter: Secondary | ICD-10-CM | POA: Diagnosis not present

## 2024-01-15 DIAGNOSIS — R000 Tachycardia, unspecified: Secondary | ICD-10-CM | POA: Diagnosis not present

## 2024-01-15 DIAGNOSIS — Y92009 Unspecified place in unspecified non-institutional (private) residence as the place of occurrence of the external cause: Secondary | ICD-10-CM | POA: Diagnosis not present

## 2024-01-15 DIAGNOSIS — H9190 Unspecified hearing loss, unspecified ear: Secondary | ICD-10-CM | POA: Diagnosis present

## 2024-01-15 DIAGNOSIS — Z801 Family history of malignant neoplasm of trachea, bronchus and lung: Secondary | ICD-10-CM

## 2024-01-15 DIAGNOSIS — M25552 Pain in left hip: Secondary | ICD-10-CM | POA: Diagnosis not present

## 2024-01-15 LAB — CBC WITH DIFFERENTIAL/PLATELET
Abs Immature Granulocytes: 0.07 K/uL (ref 0.00–0.07)
Basophils Absolute: 0 K/uL (ref 0.0–0.1)
Basophils Relative: 0 %
Eosinophils Absolute: 0 K/uL (ref 0.0–0.5)
Eosinophils Relative: 0 %
HCT: 32.7 % — ABNORMAL LOW (ref 36.0–46.0)
Hemoglobin: 11.4 g/dL — ABNORMAL LOW (ref 12.0–15.0)
Immature Granulocytes: 1 %
Lymphocytes Relative: 10 %
Lymphs Abs: 1.5 K/uL (ref 0.7–4.0)
MCH: 34.1 pg — ABNORMAL HIGH (ref 26.0–34.0)
MCHC: 34.9 g/dL (ref 30.0–36.0)
MCV: 97.9 fL (ref 80.0–100.0)
Monocytes Absolute: 1 K/uL (ref 0.1–1.0)
Monocytes Relative: 6 %
Neutro Abs: 12.5 K/uL — ABNORMAL HIGH (ref 1.7–7.7)
Neutrophils Relative %: 83 %
Platelets: 277 K/uL (ref 150–400)
RBC: 3.34 MIL/uL — ABNORMAL LOW (ref 3.87–5.11)
RDW: 11.1 % — ABNORMAL LOW (ref 11.5–15.5)
WBC: 15 K/uL — ABNORMAL HIGH (ref 4.0–10.5)
nRBC: 0 % (ref 0.0–0.2)

## 2024-01-15 LAB — COMPREHENSIVE METABOLIC PANEL WITH GFR
ALT: 12 U/L (ref 0–44)
AST: 18 U/L (ref 15–41)
Albumin: 3.9 g/dL (ref 3.5–5.0)
Alkaline Phosphatase: 43 U/L (ref 38–126)
Anion gap: 11 (ref 5–15)
BUN: 14 mg/dL (ref 8–23)
CO2: 22 mmol/L (ref 22–32)
Calcium: 9.3 mg/dL (ref 8.9–10.3)
Chloride: 96 mmol/L — ABNORMAL LOW (ref 98–111)
Creatinine, Ser: 0.8 mg/dL (ref 0.44–1.00)
GFR, Estimated: >60 mL/min (ref >60)
Glucose, Bld: 118 mg/dL — ABNORMAL HIGH (ref 70–99)
Potassium: 4.1 mmol/L (ref 3.5–5.1)
Sodium: 129 mmol/L — ABNORMAL LOW (ref 135–145)
Total Bilirubin: 0.7 mg/dL (ref 0.0–1.2)
Total Protein: 6.6 g/dL (ref 6.5–8.1)

## 2024-01-15 MED ORDER — MIDODRINE HCL 5 MG PO TABS
5.0000 mg | ORAL_TABLET | Freq: Three times a day (TID) | ORAL | Status: DC
  Administered 2024-01-16 – 2024-01-27 (×24): 5 mg via ORAL
  Filled 2024-01-15 (×26): qty 1

## 2024-01-15 MED ORDER — OXYCODONE HCL 5 MG PO TABS
5.0000 mg | ORAL_TABLET | Freq: Once | ORAL | Status: AC
  Administered 2024-01-15: 5 mg via ORAL
  Filled 2024-01-15: qty 1

## 2024-01-15 MED ORDER — MIDODRINE HCL 5 MG PO TABS: 5.0000 mg | ORAL_TABLET | Freq: Three times a day (TID) | ORAL | Status: DC

## 2024-01-15 MED ORDER — ROSUVASTATIN CALCIUM 20 MG PO TABS
40.0000 mg | ORAL_TABLET | Freq: Every day | ORAL | Status: DC
  Administered 2024-01-16 – 2024-01-27 (×12): 40 mg via ORAL
  Filled 2024-01-15: qty 8
  Filled 2024-01-15 (×5): qty 2
  Filled 2024-01-15: qty 8
  Filled 2024-01-15 (×2): qty 2
  Filled 2024-01-15: qty 8
  Filled 2024-01-15 (×2): qty 2

## 2024-01-15 MED ORDER — OXYCODONE-ACETAMINOPHEN 5-325 MG PO TABS
1.0000 | ORAL_TABLET | Freq: Once | ORAL | Status: AC
  Administered 2024-01-15: 1 via ORAL
  Filled 2024-01-15: qty 1

## 2024-01-15 MED ORDER — FLECAINIDE ACETATE 50 MG PO TABS
100.0000 mg | ORAL_TABLET | Freq: Two times a day (BID) | ORAL | Status: DC
  Administered 2024-01-15 – 2024-01-18 (×6): 100 mg via ORAL
  Filled 2024-01-15 (×6): qty 2

## 2024-01-15 MED ORDER — ONDANSETRON HCL 4 MG/2ML IJ SOLN: 4.0000 mg | Freq: Four times a day (QID) | INTRAMUSCULAR | Status: DC | PRN

## 2024-01-15 MED ORDER — SODIUM CHLORIDE 0.9 % IV BOLUS
500.0000 mL | Freq: Once | INTRAVENOUS | Status: AC
  Administered 2024-01-15: 500 mL via INTRAVENOUS

## 2024-01-15 MED ORDER — PANTOPRAZOLE SODIUM 40 MG PO TBEC
40.0000 mg | DELAYED_RELEASE_TABLET | Freq: Every day | ORAL | Status: DC
  Administered 2024-01-16 – 2024-01-27 (×12): 40 mg via ORAL
  Filled 2024-01-15 (×12): qty 1

## 2024-01-15 MED ORDER — MIDODRINE HCL 5 MG PO TABS
5.0000 mg | ORAL_TABLET | Freq: Three times a day (TID) | ORAL | Status: DC
  Filled 2024-01-15: qty 1

## 2024-01-15 MED ORDER — HYDROCODONE-ACETAMINOPHEN 10-325 MG PO TABS
1.0000 | ORAL_TABLET | Freq: Four times a day (QID) | ORAL | Status: DC | PRN
  Administered 2024-01-15 – 2024-01-19 (×13): 1 via ORAL
  Filled 2024-01-15 (×14): qty 1

## 2024-01-15 MED ORDER — MORPHINE SULFATE (PF) 4 MG/ML IV SOLN
4.0000 mg | Freq: Once | INTRAVENOUS | Status: AC
  Administered 2024-01-15: 4 mg via INTRAMUSCULAR
  Filled 2024-01-15: qty 1

## 2024-01-15 MED ORDER — SODIUM CHLORIDE 0.9 % IV BOLUS: 500.0000 mL | Freq: Once | INTRAVENOUS | Status: DC

## 2024-01-15 MED ORDER — LACTATED RINGERS IV SOLN: INTRAVENOUS | Status: AC

## 2024-01-15 MED ORDER — MIDODRINE HCL 5 MG PO TABS
5.0000 mg | ORAL_TABLET | Freq: Once | ORAL | Status: AC
  Administered 2024-01-15: 5 mg via ORAL

## 2024-01-15 MED ORDER — APIXABAN 2.5 MG PO TABS
2.5000 mg | ORAL_TABLET | Freq: Two times a day (BID) | ORAL | Status: DC
  Administered 2024-01-15 – 2024-01-20 (×10): 2.5 mg via ORAL
  Filled 2024-01-15 (×10): qty 1

## 2024-01-15 MED ORDER — ENSURE PLUS HIGH PROTEIN PO LIQD
237.0000 mL | Freq: Two times a day (BID) | ORAL | Status: DC
  Administered 2024-01-16 – 2024-01-24 (×15): 237 mL via ORAL

## 2024-01-15 MED ORDER — ONDANSETRON HCL 4 MG PO TABS: 4.0000 mg | ORAL_TABLET | Freq: Four times a day (QID) | ORAL | Status: DC | PRN

## 2024-01-15 MED ORDER — LACTULOSE 10 GM/15ML PO SOLN
20.0000 g | Freq: Every day | ORAL | Status: DC
  Administered 2024-01-16 – 2024-01-20 (×3): 20 g via ORAL
  Filled 2024-01-15 (×9): qty 30

## 2024-01-15 NOTE — ED Provider Notes (Deleted)
 Patient wanted to be discharged to home.  Patient was unable to stand and ambulate, patient became weak and shaky whenever she stood up.  Patient's blood pressure would drop when the nurses stood her up.  Patient requested multiple tries to be able to get up and go home but was unable to.  I discussed this with the patient and the family.  They have agreed to admission for pain control and further IV fluids. I discussed the patient with Dr. Meredeth hospitalist who will admit patient for further evaluation and treatment   Flint Sonny POUR, PA-C 01/15/24 2102

## 2024-01-15 NOTE — ED Notes (Signed)
 Pt able to transfer to wheelchair to go to bathroom

## 2024-01-15 NOTE — ED Notes (Signed)
 Gave patient a sandwich, and crackers

## 2024-01-15 NOTE — ED Provider Notes (Signed)
 [place] [facility] AT [facility] Provider Note   CSN: [id number] Arrival date & time: 01/15/24  1235     Patient presents with: [name] [name] is a 83 y.o. female.   Patient reports that she has a history of orthostatic hypotension.  Patient states that she is on a medicine for this.  Patient reports that her cardiologist recently increased the medicine to 5 mg 3 times a day.  Patient states today when she stood up she became very lightheaded and fell striking the left side of her face.  Patient states she struck her right arm her left knee and her chest.  Patient complains of swelling over her left eye.  She complains of pain in her neck.  Patient states that she did not lose consciousness.  Patient denies any vision change other than swelling over her eye.  Patient can move all of her extremities she has pain with moving the left knee and her left arm.  Patient denies any nausea she denies any vomiting.  Patient is complaining of a headache and pain over her eye.  Patient is here with her family members who are supportive.  Patient's family states that patient does not eat very well she does not drink fluids well.  Patient has been losing weight.  Patient has lost approximately 20 pounds since the first of the year.  Patient has a past medical history of atrial fibrillation, hyperlipidemia, colon cancer and COPD.  Patient is seen by Dr. Alphonsa.   Fall Associated symptoms include headaches.       Prior to Admission medications   Medication Sig Start Date End Date Taking? Authorizing Provider  alendronate  (FOSAMAX ) 70 MG tablet TAKE ONE TABLET (70 MG TOTAL) BY MOUTH EVERY 7 DAYS. TAKE WITH A FULL GLASS OF WATER  ON AN EMPTY STOMACH. Patient taking differently: Take 70 mg by mouth once a week. 09/03/23   Alphonsa Glendia LABOR, MD  apixaban  (ELIQUIS ) 2.5 MG TABS tablet Take 1 tablet (2.5 mg total) by mouth 2 (two) times daily. 03/03/23   Alvan Dorn FALCON, MD   Calcium  Carbonate (CALCIUM  600 PO) Take 600 mg by mouth daily.    [provider]  CONSTULOSE  10 GM/15ML solution TAKE 30ML'S (20 GRAMS TOTAL) BY MOUTH ATBEDTIME Patient taking differently: Take 20 g by mouth at bedtime. 12/02/23   Alphonsa Glendia LABOR, MD  ferrous sulfate 325 (65 FE) MG tablet Take 325 mg by mouth daily with breakfast.    [provider]  flecainide  (TAMBOCOR ) 100 MG tablet TAKE ONE TABLET (100MG  TOTAL) BY MOUTH EVERY 12 HOURS Patient taking differently: Take 100 mg by mouth 2 (two) times daily. 08/27/23   Alvan Dorn FALCON, MD  HYDROcodone -acetaminophen  (NORCO) 10-325 MG tablet One qid prn pain Patient taking differently: Take 1 tablet by mouth 4 (four) times daily as needed for moderate pain (pain score 4-6). 12/13/23   Hoskins, Carolyn C, NP  HYDROcodone -acetaminophen  (NORCO) 10-325 MG tablet 1 taken 4 times daily as needed for pain Patient taking differently: Take 1 tablet by mouth 4 (four) times daily as needed for moderate pain (pain score 4-6). 12/13/23   Hoskins, Carolyn C, NP  HYDROcodone -acetaminophen  (NORCO) 10-325 MG tablet One qid prn pain Patient taking differently: Take 1 tablet by mouth 4 (four) times daily as needed for moderate pain (pain score 4-6). 12/13/23   Mauro Elveria BROCKS, NP  midodrine  (PROAMATINE ) 5 MG tablet Take 1 tablet (5 mg total) by mouth 3 (three)  times daily with meals. 01/03/24   Alvan Dorn FALCON, MD  omeprazole (PRILOSEC) 20 MG capsule Take 20 mg by mouth daily before breakfast.     [provider]  rosuvastatin  (CRESTOR ) 40 MG tablet TAKE ONE TABLET (40MG  TOTAL) BY MOUTH DAILY Patient taking differently: Take 40 mg by mouth daily. 03/19/23   Alvan Dorn FALCON, MD  vitamin B-12 (CYANOCOBALAMIN ) 1000 MCG tablet Take 1,000 mcg by mouth daily.    [provider]    Allergies: Patient has no known allergies.    Review of Systems  Musculoskeletal:  Positive for arthralgias and neck pain.  Neurological:  Positive for  weakness and headaches.  All other systems reviewed and are negative.   Updated Vital Signs BP (!) 158/65   Pulse 66   Temp 97.8 F (36.6 C) (Oral)   Resp 16   SpO2 100%   Physical Exam Vitals and nursing note reviewed.  Constitutional:      Appearance: She is well-developed.  HENT:     Head: Normocephalic.     Comments: Patient has a large area of bruising over her left eye, there is a 1.5 cm laceration over her left eyelid.      Nose: Nose normal.     Mouth/Throat:     Mouth: Mucous membranes are moist.  Eyes:     Extraocular Movements: Extraocular movements intact.     Conjunctiva/sclera: Conjunctivae normal.     Pupils: Pupils are equal, round, and reactive to light.  Cardiovascular:     Rate and Rhythm: Normal rate and regular rhythm.  Pulmonary:     Effort: Pulmonary effort is normal.     Breath sounds: Normal breath sounds.  Abdominal:     General: There is no distension.  Musculoskeletal:     Cervical back: Normal range of motion.     Comments: Patient has abrasion over her left knee, pain with range of motion, patient has tenderness left mid arm to shoulder pain with movement.  Neurovascular neurosensory intact  Skin:    General: Skin is warm.  Neurological:     General: No focal deficit present.     Mental Status: She is alert and oriented to person, place, and time.  Psychiatric:        Mood and Affect: Mood normal.     (all labs ordered are listed, but only abnormal results are displayed) Labs Reviewed  CBC WITH DIFFERENTIAL/PLATELET - Abnormal; Notable for the following components:      Result Value   WBC 15.0 (*)    RBC 3.34 (*)    Hemoglobin 11.4 (*)    HCT 32.7 (*)    MCH 34.1 (*)    RDW 11.1 (*)    Neutro Abs 12.5 (*)    All other components within normal limits  COMPREHENSIVE METABOLIC PANEL WITH GFR - Abnormal; Notable for the following components:   Sodium 129 (*)    Chloride 96 (*)    Glucose, Bld 118 (*)    All other components  within normal limits    EKG: EKG Interpretation Date/Time:  Saturday January 15 2024 17:02:09 EDT Ventricular Rate:  72 PR Interval:  208 QRS Duration:  131 QT Interval:  438 QTC Calculation: 480 R Axis:   82  Text Interpretation: Sinus rhythm Right bundle branch block since last tracing no significant change Confirmed by Cleotilde Rogue ([id number]) on 01/15/2024 6:26:04 PM  Radiology: [facility] Knee Complete 4 Views Left Result Date: 01/15/2024 CLINICAL DATA:  Left  knee pain after fall today. EXAM: LEFT KNEE - COMPLETE 4+ VIEW COMPARISON:  April 27, 2023. FINDINGS: No evidence of fracture, dislocation, or joint effusion. No evidence of arthropathy or other focal bone abnormality. Soft tissues are unremarkable. IMPRESSION: Negative. Electronically Signed   By: Lynwood Landy Raddle M.D.   On: 01/15/2024 14:19   DG Chest 2 View Result Date: 01/15/2024 CLINICAL DATA:  Chest pain after fall today. EXAM: CHEST - 2 VIEW COMPARISON:  November 20, 2023. FINDINGS: The heart size and mediastinal contours are within normal limits. Both lungs are clear. The visualized skeletal structures are unremarkable. IMPRESSION: No active cardiopulmonary disease. Electronically Signed   By: Lynwood Landy Raddle M.D.   On: 01/15/2024 14:18   CT Maxillofacial Wo Contrast Result Date: 01/15/2024 CLINICAL DATA:  Status post fall. EXAM: CT MAXILLOFACIAL WITHOUT CONTRAST TECHNIQUE: Multidetector CT imaging of the maxillofacial structures was performed. Multiplanar CT image reconstructions were also generated. RADIATION DOSE REDUCTION: This exam was performed according to the departmental dose-optimization program which includes automated exposure control, adjustment of the mA and/or kV according to patient size and/or use of iterative reconstruction technique. COMPARISON:  None Available. FINDINGS: Osseous: No fracture or mandibular dislocation. No destructive process. Orbits: Negative. No traumatic or inflammatory finding. Sinuses: Clear. Soft  tissues: There is marked severity left-sided facial soft tissue swelling. An associated 1.6 cm x 2.7 cm left-sided facial hematoma is noted. Mild to moderate severity lateral left periorbital soft tissue swelling is present. Limited intracranial: No significant or unexpected finding. IMPRESSION: 1. Marked severity left-sided facial soft tissue swelling with an associated 1.6 cm x 2.7 cm left-sided facial hematoma. 2. Mild to moderate severity lateral left periorbital soft tissue swelling. 3. No acute facial bone fracture. Electronically Signed   By: Suzen Dials M.D.   On: 01/15/2024 13:49   CT Cervical Spine Wo Contrast Result Date: 01/15/2024 CLINICAL DATA:  Status post fall. EXAM: CT CERVICAL SPINE WITHOUT CONTRAST TECHNIQUE: Multidetector CT imaging of the cervical spine was performed without intravenous contrast. Multiplanar CT image reconstructions were also generated. RADIATION DOSE REDUCTION: This exam was performed according to the departmental dose-optimization program which includes automated exposure control, adjustment of the mA and/or kV according to patient size and/or use of iterative reconstruction technique. COMPARISON:  April 27, 2023 FINDINGS: Alignment: There is approximately 2 mm stepwise anterolisthesis of the C4 vertebral body on C5 and C5 vertebral body on C6. Skull base and vertebrae: No acute fracture. No primary bone lesion or focal pathologic process. Soft tissues and spinal canal: No prevertebral fluid or swelling. No visible canal hematoma. Disc levels: Mild multilevel endplate sclerosis is seen throughout the cervical spine with very mild anterior osteophyte formation and posterior bony spurring noted at the levels of C4-C5 and C5-C6. There is mild multilevel intervertebral disc space narrowing throughout the cervical spine. Marked severity bilateral cavernous carotid artery calcification is noted. Upper chest: There is moderate to marked severity biapical scarring and/or  atelectasis. Other: None. IMPRESSION: 1. No acute cervical spine fracture. 2. Multilevel degenerative changes, as described above. 3. Moderate to marked severity biapical scarring and/or atelectasis. Electronically Signed   By: Suzen Dials M.D.   On: 01/15/2024 13:44   CT Head Wo Contrast Result Date: 01/15/2024 CLINICAL DATA:  Status post fall. EXAM: CT HEAD WITHOUT CONTRAST TECHNIQUE: Contiguous axial images were obtained from the base of the skull through the vertex without intravenous contrast. RADIATION DOSE REDUCTION: This exam was performed according to the departmental dose-optimization program which includes  automated exposure control, adjustment of the mA and/or kV according to patient size and/or use of iterative reconstruction technique. COMPARISON:  None Available. FINDINGS: Brain: There is mild generalized cerebral atrophy with widening of the extra-axial spaces and ventricular dilatation. There are areas of decreased attenuation within the white matter tracts of the supratentorial brain, consistent with microvascular disease changes. Vascular: No hyperdense vessel or unexpected calcification. Skull: Normal. Negative for fracture or focal lesion. Sinuses/Orbits: No acute finding. Other: There is marked severity left-sided facial soft tissue swelling. An associated 1.7 cm x 2.3 cm left-sided facial hematoma is seen. IMPRESSION: 1. Marked severity left-sided facial soft tissue swelling with an associated 1.7 cm x 2.3 cm left-sided facial hematoma. 2. No acute intracranial abnormality. Electronically Signed   By: Suzen Dials M.D.   On: 01/15/2024 13:39   DG Humerus Left Result Date: 01/15/2024 CLINICAL DATA:  Left arm pain after fall today. EXAM: LEFT HUMERUS - 2+ VIEW COMPARISON:  May 26, 2021. FINDINGS: There is no evidence of acute fracture or dislocation. Old healed fracture involving proximal left humeral head is noted. IMPRESSION: No acute abnormality seen. Electronically Signed    By: Lynwood Landy Raddle M.D.   On: 01/15/2024 13:26     .Laceration Repair  Date/Time: 01/15/2024 9:15 PM  Performed by: Flint Sonny POUR, PA-C Authorized by: Flint Sonny POUR, PA-C   Consent:    Consent obtained:  Verbal   Consent given by:  Patient   Risks discussed:  Pain   Alternatives discussed:  No treatment Universal protocol:    Procedure explained and questions answered to patient or proxy's satisfaction: yes     Immediately prior to procedure, a time out was called: yes     Patient identity confirmed:  Verbally with patient Anesthesia:    Anesthesia method:  None Laceration details:    Location:  Face   Length (cm):  1.5 Treatment:    Area cleansed with:  Chlorhexidine   Irrigation solution:  Sterile saline Skin repair:    Repair method:  Tissue adhesive Post-procedure details:    Procedure completion:  Tolerated well, no immediate complications    Medications Ordered in the ED  midodrine  (PROAMATINE ) tablet 5 mg (has no administration in time range)  sodium chloride  0.9 % bolus 500 mL (has no administration in time range)  morphine  (PF) 4 MG/ML injection 4 mg (4 mg Intramuscular Given 01/15/24 1344)  sodium chloride  0.9 % bolus 500 mL (0 mLs Intravenous Stopped 01/15/24 1711)  oxyCODONE -acetaminophen  (PERCOCET/ROXICET) 5-325 MG per tablet 1 tablet (1 tablet Oral Given 01/15/24 1724)    And  oxyCODONE  (Oxy IR/ROXICODONE ) immediate release tablet 5 mg (5 mg Oral Given 01/15/24 1724)  midodrine  (PROAMATINE ) tablet 5 mg (5 mg Oral Given 01/15/24 1800)                                    Medical Decision Making Patient has a history of orthostatic hypotension.  Patient had a episode today fell striking her face hitting her arm and her knee.  Amount and/or Complexity of Data Reviewed Independent Historian:     Details: Patient is here with her family who are supportive.  Family reports concern that patient could be dehydrated because she does not eat or drink well. Labs:  ordered. Decision-making details documented in ED Course.    Details: Labs oh ordered reviewed and interpreted.  White blood cell count is 15  sodium is 129. Radiology: ordered and independent interpretation performed. Decision-making details documented in ED Course.    Details: CT head CT maxillofacial and CT cervical spine shows no acute findings.  X-ray left humerus left knee and chest x-ray show no acute findings Discussion of management or test interpretation with external provider(s): Hospitalist consulted for admission  Risk Prescription drug management. Decision regarding hospitalization. Risk Details: Patient given an injection of morphine  for pain.  I discussed the results with patient and family patient wants to go home.  I asked nursing staff to help patient ambulate.  Patient became lightheaded and very shaky with standing.  Patient still wants to go home.  We attempted a second time and patient had a similar episode nurses were able to record that her blood pressure dropped from a systolic of 170-100 when she stood up.  Patient was given her midodrine  dosage.  She was given her home pain medication.  Patient is still unable to tolerate standing or ambulating without becoming near syncopal.  Patient and family agreed to patient being admitted for further evaluation and treatment.        Final diagnoses:  Contusion of face, initial encounter  Contusion of left knee, initial encounter  Facial laceration, initial encounter  Orthostatic hypotension    ED Discharge Orders     None          Flint Sonny MARLA DEVONNA 01/15/24 2121    Suzette Pac, MD 01/16/24 [phone]

## 2024-01-15 NOTE — H&P (Signed)
 History and Physical    Patient: [name] FMW:[id number] DOB: 24-Mar-1941 DOA: 01/15/2024 DOS: the patient was seen and examined on 01/15/2024 PCP: Alphonsa Glendia LABOR, MD  Patient coming from: Home  Chief Complaint:  Chief Complaint  Patient presents with   Fall   HPI: [name] is a 83 y.o. female with medical history significant of PAF on anticoagulation and flecainide , ostoporosis, hyperlipidemia, chronic pain on opiates. Patient has a long hystory of orthostasis and has been on midodrine  intermittently over the last year. She was initially taking 2.5mg  TID, but recently had this increased to 5mg  TID on Tuesday. Dispite this, she has continued to have episodes of orthostasis. Today, she had a fall due to the orthostasis. She did not loose consciousness, but she hit her face and chest on the hard floor. She was also experiencing pain in her neck, right arm, left knee and chest. She was brought here for evaluation. She does not eat well or drink a lot of fluids. No fevers, chills, nausea, vomiting, diarrhea, abdominal pain.  On her labs, she has a mild hyponatremia and a leukocytosis. Imaging was all negative for acute injury.  Review of Systems: As mentioned in the history of present illness. All other systems reviewed and are negative. Past Medical History:  Diagnosis Date   Adenocarcinoma, colon (HCC) 03/2005   Stage I; resected in 03/2005   Arthritis    Colon cancer [facility])    Removed surgicaly    Dysrhythmia    Gastroesophageal reflux disease    Gastroparesis    Hearing loss    mild   Hyperlipidemia    Irregular heartbeat    Ovarian cyst 2008   Pain in joint of right shoulder 06/29/2017   Pain in joint, ankle and foot 03/13/2014   Paroxysmal atrial fibrillation (HCC)    Palpitations; maintained on flecainide ; -normal coronary angiography and 1999; negative stress nuclear study in 11/2005   PONV (postoperative nausea and vomiting)    Tobacco abuse, in remission     10-pack-years; quit in 2001   Past Surgical History:  Procedure Laterality Date   ABDOMINAL HYSTERECTOMY  1983   BACK SURGERY     BIOPSY  10/18/2017   Procedure: BIOPSY;  Surgeon: Golda Claudis PENNER, MD;  [facility];  Service: Endoscopy;;  gastric   BIOPSY  07/06/2018   Procedure: BIOPSY;  Surgeon: Golda Claudis PENNER, MD;  [facility];  Service: Endoscopy;;  gastric   CATARACT EXTRACTION W/PHACO Right 12/22/2021   Procedure: CATARACT EXTRACTION PHACO AND INTRAOCULAR LENS PLACEMENT (IOC);  Surgeon: Harrie Agent, MD;  Location: [facility];  Service: Ophthalmology;  Laterality: Right;  CDE: 13.86   CATARACT EXTRACTION W/PHACO Left 01/05/2022   Procedure: CATARACT EXTRACTION PHACO AND INTRAOCULAR LENS PLACEMENT (IOC);  Surgeon: Harrie Agent, MD;  Location: [facility];  Service: Ophthalmology;  Laterality: Left;  CDE 21.39   CHOLECYSTECTOMY     COLON SURGERY  2006   Colon cancer   COLONOSCOPY  03/25/2012   Claudis PENNER Golda, MD   COLONOSCOPY N/A 04/26/2017   Procedure: COLONOSCOPY;  Surgeon: Golda Claudis PENNER, MD;  [facility];  Service: Endoscopy;  Laterality: N/A;  2:00   COLONOSCOPY N/A 07/06/2018   Procedure: COLONOSCOPY;  Surgeon: Golda Claudis PENNER, MD;  [facility];  Service: Endoscopy;  Laterality: N/A;  10:30   COLONOSCOPY WITH PROPOFOL  N/A 09/10/2021   Procedure: COLONOSCOPY WITH PROPOFOL ;  Surgeon: Golda Claudis PENNER, MD;  [facility];  Service: Endoscopy;  Laterality: N/A;  910   COSMETIC SURGERY  09/2007   ESOPHAGOGASTRODUODENOSCOPY N/A 10/18/2017   Procedure: ESOPHAGOGASTRODUODENOSCOPY (EGD);  Surgeon: Golda Claudis PENNER, MD;  [facility];  Service: Endoscopy;  Laterality: N/A;   ESOPHAGOGASTRODUODENOSCOPY N/A 07/06/2018   Procedure: ESOPHAGOGASTRODUODENOSCOPY (EGD);  Surgeon: Golda Claudis PENNER, MD;  [facility];  Service: Endoscopy;  Laterality: N/A;   GIVENS CAPSULE STUDY N/A 12/26/2019   Procedure: GIVENS CAPSULE  STUDY;  Surgeon: Golda Claudis PENNER, MD;  [facility];  Service: Endoscopy;  Laterality: N/A;  730   PARTIAL COLECTOMY  2006   adenocarcinoma   POLYPECTOMY  04/26/2017   Procedure: POLYPECTOMY;  Surgeon: Golda Claudis PENNER, MD;  [facility];  Service: Endoscopy;;  colon   POLYPECTOMY  07/06/2018   Procedure: POLYPECTOMY;  Surgeon: Golda Claudis PENNER, MD;  [facility];  Service: Endoscopy;;  colon    POLYPECTOMY  09/10/2021   Procedure: POLYPECTOMY;  Surgeon: Golda Claudis PENNER, MD;  [facility];  Service: Endoscopy;;  transverse x2;sigmoid   TONSILLECTOMY     Social History:  reports that she quit smoking about 21 years ago. Her smoking use included cigarettes. She started smoking about 60 years ago. She has a 12 pack-year smoking history. She has never used smokeless tobacco. She reports that she does not drink alcohol and does not use drugs.  No Known Allergies  Family History  Problem Relation Age of Onset   Heart disease Mother    Stroke Mother    Heart attack Mother    Cancer Father        lung   Heart disease Father    Hypertension Father    Cancer Brother        rectal   Healthy Son    Heart failure Sister    Stroke Sister     Prior to Admission medications   Medication Sig Start Date End Date Taking? Authorizing Provider  alendronate  (FOSAMAX ) 70 MG tablet TAKE ONE TABLET (70 MG TOTAL) BY MOUTH EVERY 7 DAYS. TAKE WITH A FULL GLASS OF WATER  ON AN EMPTY STOMACH. Patient taking differently: Take 70 mg by mouth once a week. 09/03/23   Alphonsa Glendia LABOR, MD  apixaban  (ELIQUIS ) 2.5 MG TABS tablet Take 1 tablet (2.5 mg total) by mouth 2 (two) times daily. 03/03/23   Alvan Dorn FALCON, MD  Calcium  Carbonate (CALCIUM  600 PO) Take 600 mg by mouth daily.    [provider]  CONSTULOSE  10 GM/15ML solution TAKE 30ML'S (20 GRAMS TOTAL) BY MOUTH ATBEDTIME Patient taking differently: Take 20 g by mouth at bedtime. 12/02/23   Alphonsa Glendia LABOR,  MD  ferrous sulfate 325 (65 FE) MG tablet Take 325 mg by mouth daily with breakfast.    [provider]  flecainide  (TAMBOCOR ) 100 MG tablet TAKE ONE TABLET (100MG  TOTAL) BY MOUTH EVERY 12 HOURS Patient taking differently: Take 100 mg by mouth 2 (two) times daily. 08/27/23   Alvan Dorn FALCON, MD  HYDROcodone -acetaminophen  (NORCO) 10-325 MG tablet One qid prn pain Patient taking differently: Take 1 tablet by mouth 4 (four) times daily as needed for moderate pain (pain score 4-6). 12/13/23   Hoskins, Carolyn C, NP  HYDROcodone -acetaminophen  (NORCO) 10-325 MG tablet 1 taken 4 times daily as needed for pain Patient taking differently: Take 1 tablet by mouth 4 (four) times daily as needed for moderate pain (pain score 4-6). 12/13/23   Mauro Coy  C, NP  HYDROcodone -acetaminophen  (NORCO) 10-325 MG tablet One qid prn pain Patient taking differently: Take 1 tablet by mouth 4 (four) times daily as needed for moderate pain (pain score 4-6). 12/13/23   Mauro Elveria BROCKS, NP  midodrine  (PROAMATINE ) 5 MG tablet Take 1 tablet (5 mg total) by mouth 3 (three) times daily with meals. 01/03/24   Alvan Dorn FALCON, MD  omeprazole (PRILOSEC) 20 MG capsule Take 20 mg by mouth daily before breakfast.     [provider]  rosuvastatin  (CRESTOR ) 40 MG tablet TAKE ONE TABLET (40MG  TOTAL) BY MOUTH DAILY Patient taking differently: Take 40 mg by mouth daily. 03/19/23   Alvan Dorn FALCON, MD  vitamin B-12 (CYANOCOBALAMIN ) 1000 MCG tablet Take 1,000 mcg by mouth daily.    [provider]    Physical Exam: Vitals:   01/15/24 1900 01/15/24 2015 01/15/24 2030 01/15/24 2040  BP: (!) 128/57 (!) 161/60 (!) 158/65   Pulse:   66   Resp:   16   Temp:    97.8 F (36.6 C)  TempSrc:    Oral  SpO2:   100%    General: elderly female. Awake and alert and oriented x3. No acute cardiopulmonary distress.  HEENT: Normocephalic atraumatic.  Right and left ears normal in appearance.  Large ecchimotic area  involving left eye  Moist mucosal membranes. No mucosal lesions.  Neck: Neck supple without lymphadenopathy. No carotid bruits. No masses palpated.  Cardiovascular: Regular rate with normal S1-S2 sounds. No murmurs, rubs, gallops auscultated. No JVD.  Respiratory: Good respiratory effort with no wheezes, rales, rhonchi. Lungs clear to auscultation bilaterally.  No accessory muscle use. Abdomen: Soft, nontender, nondistended. Active bowel sounds. No masses or hepatosplenomegaly  Skin: No rashes, lesions, or ulcerations.  Dry, warm to touch. 2+ dorsalis pedis and radial pulses. Musculoskeletal: No calf or leg pain. All major joints not erythematous nontender.  No upper or lower joint deformation.  Good ROM.  No contractures  Psychiatric: Intact judgment and insight. Pleasant and cooperative. Neurologic: No focal neurological deficits. Strength is 5/5 and symmetric in upper and lower extremities.  Cranial nerves II through XII are grossly intact.  Data Reviewed: I have reviewed patient's imaging and labs  Assessment and Plan: No notes have been filed under this hospital service. Service: Hospitalist  Principal Problem:   Orthostasis Active Problems:   Paroxysmal atrial fibrillation (HCC)   Osteoporosis   Hyponatremia   Leukocytosis  Orthostasis Gentle IV fluids overnight Orthostasis vital signs in the morning If continues to be orthostatic, may need to increase midodrine  to 7.5 mg 3 times daily.  May also need to discuss with cardiology to see whether her dose of flecainide  needs to be adjusted Will encourage patient to drink fluids.  Patient likely mildly dehydrated, especially with the mild hyponatremia. Recheck BMP in the morning Paroxysmal atrial fibrillation Continue flecainide  and Hyponatremia Recheck sodium level in the morning after gentle hydration Leukocytosis No source of infection.  Will repeat in the morning to see if normalizes   Advance Care Planning:   Code Status:  Full Code   Consults:   Family Communication: Patient's family present during interview and exam  Severity of Illness: The appropriate patient status for this patient is OBSERVATION. Observation status is judged to be reasonable and necessary in order to provide the required intensity of service to ensure the patient's safety. The patient's presenting symptoms, physical exam findings, and initial radiographic and laboratory data in the context of their medical condition is  felt to place them at decreased risk for further clinical deterioration. Furthermore, it is anticipated that the patient will be medically stable for discharge from the hospital within 2 midnights of admission.   Author: Shervin Cypert J Ellora Varnum, DO 01/15/2024 9:41 PM  For on call review [email or web address].

## 2024-01-15 NOTE — ED Triage Notes (Addendum)
 Pt walked into kitchen to fix something to eat and had fall from standing position striking hardwood floor.  Left facial trauma and head trauma  C collar in place Also pain in left humeral area (previous fx).  Pt is on eliquis .  No LOC.   Last week she reports her BP meds were doubled  Pt reports dizziness since medication adjustment.

## 2024-01-16 ENCOUNTER — Encounter (HOSPITAL_COMMUNITY): Payer: Self-pay | Admitting: Family Medicine

## 2024-01-16 DIAGNOSIS — I951 Orthostatic hypotension: Secondary | ICD-10-CM

## 2024-01-16 LAB — BASIC METABOLIC PANEL WITH GFR
Anion gap: 11 (ref 5–15)
BUN: 13 mg/dL (ref 8–23)
CO2: 22 mmol/L (ref 22–32)
Calcium: 8.6 mg/dL — ABNORMAL LOW (ref 8.9–10.3)
Chloride: 99 mmol/L (ref 98–111)
Creatinine, Ser: 0.78 mg/dL (ref 0.44–1.00)
GFR, Estimated: >60 mL/min (ref >60)
Glucose, Bld: 101 mg/dL — ABNORMAL HIGH (ref 70–99)
Potassium: 3.5 mmol/L (ref 3.5–5.1)
Sodium: 132 mmol/L — ABNORMAL LOW (ref 135–145)

## 2024-01-16 LAB — CBC
HCT: 30.2 % — ABNORMAL LOW (ref 36.0–46.0)
Hemoglobin: 10.3 g/dL — ABNORMAL LOW (ref 12.0–15.0)
MCH: 34.3 pg — ABNORMAL HIGH (ref 26.0–34.0)
MCHC: 34.1 g/dL (ref 30.0–36.0)
MCV: 100.7 fL — ABNORMAL HIGH (ref 80.0–100.0)
Platelets: 265 K/uL (ref 150–400)
RBC: 3 MIL/uL — ABNORMAL LOW (ref 3.87–5.11)
RDW: 11.5 % (ref 11.5–15.5)
WBC: 11.1 K/uL — ABNORMAL HIGH (ref 4.0–10.5)
nRBC: 0 % (ref 0.0–0.2)

## 2024-01-16 MED ORDER — KETOROLAC TROMETHAMINE 15 MG/ML IJ SOLN
15.0000 mg | Freq: Once | INTRAMUSCULAR | Status: AC
  Administered 2024-01-16: 15 mg via INTRAVENOUS
  Filled 2024-01-16: qty 1

## 2024-01-16 NOTE — Progress Notes (Signed)
  Progress Note   Patient: [name] FMW:[id number] DOB: 04/20/1941 DOA: 01/15/2024     0 DOS: the patient was seen and examined on 01/16/2024   Brief hospital course:  83 y.o. female with medical history significant of PAF on anticoagulation and flecainide , ostoporosis, hyperlipidemia, chronic pain on opiates. Patient has a long hystory of orthostasis and has been on midodrine  intermittently over the last year. She was initially taking 2.5mg  TID, but recently had this increased to 5mg  TID on Tuesday. Dispite this, she has continued to have episodes of orthostasis. Today, she had a fall due to the orthostasis. She did not loose consciousness, but she hit her face and chest on the hard floor. She was also experiencing pain in her neck, right arm, left knee and chest. She was brought for evaluation. She does not eat well or drink a lot of fluids. No fevers, chills, nausea, vomiting, diarrhea, abdominal pain.   Assessment and Plan: Orthostasis Complicated by dehydration Continue IV hydration, repeat Orthostasis Consider abdominal binder and compression socks If continues to be orthostatic, may need to increase midodrine  to 7.5 mg 3 times daily.  Consider florinef. May also need to discuss with cardiology to see whether her dose of flecainide  needs to be adjusted Echo from 2024 reviewed-No valvular abnormalities, EF 60% Paroxysmal atrial fibrillation Continue flecainide  and anticoagulation Rate controlled. Hyponatremia Improved with gentle hydration Leukocytosis No source of infection.likely due to dehydration Improved with IVF        Subjective: Seen at bedside, she complains of pain from her fall, she denies chest pain, dyspnea, nausea or vomiting.   Physical Exam: Vitals:   01/15/24 2210 01/15/24 2213 01/16/24 0137 01/16/24 0524  BP: (!) 135/53  (!) 136/55 (!) 159/59  Pulse: 70  66 70  Resp: 17  16 16   Temp: 98.2 F (36.8 C)  98.5 F (36.9 C) 98.3 F (36.8 C)  TempSrc: Oral   Oral Oral  SpO2: 99% 96% 98% 100%  Weight: 47.7 kg     Height: 5' 2 (1.575 m)     General appearance - alert, well appearing, and in no distress HEENT-Left eye  bruise, covered with dressing, left  cheek bruise and swelling.  Chest--Diminished breath sounds bilaterally no wheezes, or rhonchi, symmetric air entry, possible  Heart - normal rate, regular rhythm, normal S1, S2, no murmurs, rubs, clicks or gallops Abdomen - soft, nontender, nondistended, no masses or organomegaly Neurological -alert and oriented to self Extremities - No pedal edema  Data Reviewed: lABS AND PRIOR IMAGING REVIEWED Family Communication:   Disposition: Status is: Observation The patient remains OBS appropriate and will d/c before 2 midnights.  Planned Discharge Destination: Home    Time spet: 35 minutes  Author: Landon FORBES Baller, MD 01/16/2024 9:35 AM  For on call review [email or web address].

## 2024-01-16 NOTE — Progress Notes (Signed)
 Orthostatic Vitals:                               Pulse: Lying down : 141/56 (80)                       71 Sitting: 135/65 (84)                                75 Standing: 142/126 (132)                        75 Standing 3 Minutes: 118/49 (64)            97

## 2024-01-16 NOTE — Progress Notes (Signed)
   01/16/24 1802  TOC Brief Assessment  Insurance and Status Reviewed  Patient has primary care physician Yes  Home environment has been reviewed From home alone  Prior level of function: Independent  Prior/Current Home Services No current home services  Social Drivers of Health Review SDOH reviewed no interventions necessary  Readmission risk has been reviewed Yes  Transition of care needs no transition of care needs at this time   Transition of Care Department [facility]) has reviewed patient and no TOC needs have been identified at this time. We will continue to monitor patient advancement through interdisciplinary progression rounds. If new patient transition needs arise, please place a TOC consult.

## 2024-01-16 NOTE — Care Management Obs Status (Signed)
 MEDICARE OBSERVATION STATUS NOTIFICATION   Patient Details  Name: [name] MRN: [id number] Date of Birth: 03/01/1941   Medicare Observation Status Notification Given:  Yes    Nena LITTIE Coffee, RN 01/16/2024, 6:01 PM

## 2024-01-16 NOTE — Progress Notes (Signed)
 Patient arrived from the ED and was admitted to room 326. Pts sister is present as well. Pt is alert, verbal oriented x4. Pts L eye is swollen shut with dark large purple bruising to L eye area and below L eye around cheek bone area with redness. Gauze covering upper L eye due to small skin tear bleeding. Abrasion noted to L knee with bruising. Assisted into bed with bed alarm on and call light within reach.

## 2024-01-16 NOTE — Plan of Care (Signed)

## 2024-01-17 DIAGNOSIS — I480 Paroxysmal atrial fibrillation: Secondary | ICD-10-CM

## 2024-01-17 DIAGNOSIS — D72829 Elevated white blood cell count, unspecified: Secondary | ICD-10-CM | POA: Diagnosis not present

## 2024-01-17 DIAGNOSIS — E871 Hypo-osmolality and hyponatremia: Secondary | ICD-10-CM | POA: Diagnosis not present

## 2024-01-17 DIAGNOSIS — I951 Orthostatic hypotension: Secondary | ICD-10-CM | POA: Diagnosis not present

## 2024-01-17 DIAGNOSIS — W19XXXA Unspecified fall, initial encounter: Secondary | ICD-10-CM

## 2024-01-17 DIAGNOSIS — S0990XA Unspecified injury of head, initial encounter: Secondary | ICD-10-CM

## 2024-01-17 DIAGNOSIS — Y92009 Unspecified place in unspecified non-institutional (private) residence as the place of occurrence of the external cause: Secondary | ICD-10-CM

## 2024-01-17 NOTE — Progress Notes (Signed)
 Has been requesting prn pain medication for face and chronic back pain.

## 2024-01-17 NOTE — Plan of Care (Signed)

## 2024-01-17 NOTE — Progress Notes (Signed)
 Progress Note   Patient: [name] FMW:[id number] DOB: 01-08-1941 DOA: 01/15/2024     0 DOS: the patient was seen and examined on 01/17/2024   Brief hospital course: 83 y.o. female with medical history significant of PAF on anticoagulation and flecainide , ostoporosis, hyperlipidemia, chronic pain on opiates, long hystory of orthostasis and has been on midodrine  intermittently over the last year presented with fall.  She is admitted to hospitalist service for evaluation of fall due to orthostasis.  Assessment and Plan: Orthostasis S/p Fall, head trauma- Long h/o orthostasis, complicated by dehydration. Daily Orthostatic vitals ordered. Echo from 2024 reviewed-No valvular abnormalities, EF 60%. Ordered abdominal binder. Continue midodrine  5mg  tid. Discussed with Cardiology, may consider florinef if still orthostatic.  PT/ OT evaluation.  Paroxysmal atrial fibrillation Flecainide , though can cause hypotension should not cause orthostasis. Continue flecainide , eliquis . Risks and benefits of anticoagulation discussed.  Hyponatremia Improved with gentle hydration. Labs for tomorrow ordered.  Leukocytosis Reactive in the setting of dehydration, fall Improved with IVF. Trend CBC.  Debility- Generalized weakness- Due to poor oral intake. Lives alone, will get PT/ OT for dc plan.    Out of bed to chair. Incentive spirometry. Nursing supportive care. Fall, aspiration precautions. Diet:  Diet Orders (From admission, onward)     Start     Ordered   01/15/24 2213  Diet Heart Room service appropriate? Yes; Fluid consistency: Thin  Diet effective now       Question Answer Comment  Room service appropriate? Yes   Fluid consistency: Thin      01/15/24 2212           DVT prophylaxis: apixaban  (ELIQUIS ) tablet 2.5 mg Start: 01/15/24 2300 apixaban  (ELIQUIS ) tablet 2.5 mg  Level of care: Telemetry   Code Status: Full Code  Subjective: Patient is seen and examined today  morning. She is lying in bed, has large contusion over left cheek, left eyebrow wound dressing. She feels weak, did not get out of bed.  Physical Exam: Vitals:   01/16/24 1355 01/16/24 2037 01/17/24 0614 01/17/24 1210  BP: (!) 144/57 (!) 132/50 111/60 (!) 126/52  Pulse: 69 76 66 71  Resp: 15 20 18    Temp: 98.1 F (36.7 C) 98.9 F (37.2 C) 99.6 F (37.6 C) 97.8 F (36.6 C)  TempSrc: Oral Oral Oral Oral  SpO2: 98% 97% 98% 96%  Weight:      Height:        General - Elderly Caucasian thin built weak female, no apparent distress HEENT - PERRLA, EOMI, bruise over left eyelid, cheek noted, non tender sinuses. Lung - Clear, basal rales, no rhonchi, wheezes. Heart - S1, S2 heard, no murmurs, rubs, no pedal edema. Abdomen - Soft, non tender, bowel sounds good Neuro - Alert, awake and oriented x 3, non focal exam. Skin - Warm and dry.  Data Reviewed:      Latest Ref Rng & Units 01/16/2024    4:17 AM 01/15/2024    4:04 PM 11/20/2023    4:45 AM  CBC  WBC 4.0 - 10.5 K/uL 11.1  15.0  7.2   Hemoglobin 12.0 - 15.0 g/dL 89.6  88.5  89.3   Hematocrit 36.0 - 46.0 % 30.2  32.7  32.9   Platelets 150 - 400 K/uL 265  277  261       Latest Ref Rng & Units 01/16/2024    4:17 AM 01/15/2024    4:04 PM 12/29/2023    1:44 PM  BMP  Glucose  70 - 99 mg/dL 898  881  892   BUN 8 - 23 mg/dL 13  14  17    Creatinine 0.44 - 1.00 mg/dL 9.21  9.19  8.77   BUN/Creat Ratio 12 - 28   14   Sodium 135 - 145 mmol/L 132  129  135   Potassium 3.5 - 5.1 mmol/L 3.5  4.1  5.1   Chloride 98 - 111 mmol/L 99  96  98   CO2 22 - 32 mmol/L 22  22  24    Calcium  8.9 - 10.3 mg/dL 8.6  9.3  9.7    No results found.  Family Communication: Discussed with patient, she understand and agree. All questions answered.  Disposition: Status is: Observation The patient remains OBS appropriate and will d/c before 2 midnights.  Planned Discharge Destination: Home with Home Health     Time spent: 44 minutes  Author: Concepcion Riser, MD 01/17/2024 2:40 PM Secure chat 7am to 7pm For on call review [email or web address].

## 2024-01-17 NOTE — Progress Notes (Signed)
 Mobility Specialist Progress Note:    01/17/24 1510  Mobility  Activity Ambulated with assistance  Level of Assistance Contact guard assist, steadying assist  Assistive Device Front wheel walker  Distance Ambulated (ft) 35 ft  Range of Motion/Exercises Active;All extremities  Activity Response Tolerated well  Mobility Referral Yes  Mobility visit 1 Mobility  Mobility Specialist Start Time (ACUTE ONLY) 1510  Mobility Specialist Stop Time (ACUTE ONLY) 1522  Mobility Specialist Time Calculation (min) (ACUTE ONLY) 12 min   Pt received in chair, requesting assistance to ambulate. Required CGA with RW to stand and ambulate. Tolerated well, c/o weakness near end of session. Left pt supine, alarm on. All needs met. Systolic blood pressure still in 90's when standing, diastolic 50-60's  Terin Cragle Mobility Specialist Please contact via Special educational needs teacher or  Rehab office at [phone]

## 2024-01-17 NOTE — Progress Notes (Signed)
 Mobility Specialist Progress Note:    01/17/24 1330  Mobility  Activity Stood at bedside;Ambulated with assistance  Level of Assistance Contact guard assist, steadying assist  Assistive Device Front wheel walker  Distance Ambulated (ft) 15 ft  Range of Motion/Exercises Active;All extremities  Activity Response Tolerated well  Mobility Referral Yes  Mobility visit 1 Mobility  Mobility Specialist Start Time (ACUTE ONLY) 1330  Mobility Specialist Stop Time (ACUTE ONLY) 1350  Mobility Specialist Time Calculation (min) (ACUTE ONLY) 20 min   Pt received in chair, visitor in room. Requesting assistance to bathroom, required CGA to stand and ambulate with RW. Tolerated well, BP and HR's below. Returned to chair, alarm on. All needs met.  Seated: BP- 95/54.  HR- 80 Standing 0 mins: BP- 88/37.  HR- 91  [name] Mobility Specialist Please contact via Special educational needs teacher or  Rehab office at [phone]

## 2024-01-17 NOTE — Progress Notes (Signed)
 Mobility Specialist Progress Note:    01/17/24 1300  Mobility  Activity Pivoted/transferred from bed to chair  Level of Assistance Contact guard assist, steadying assist  Assistive Device Front wheel walker  Distance Ambulated (ft) 2 ft  Range of Motion/Exercises Active;All extremities  Activity Response Tolerated well  Mobility Referral Yes  Mobility visit 1 Mobility  Mobility Specialist Start Time (ACUTE ONLY) 1250  Mobility Specialist Stop Time (ACUTE ONLY) 1308  Mobility Specialist Time Calculation (min) (ACUTE ONLY) 18 min   Pt received In bed, agreeable to mobility. Required CGA to stand and transfer with RW. Tolerated well,asx throughout. Alarm on, call bell in reach. All needs met.  [name] Mobility Specialist Please contact via Special educational needs teacher or  Rehab office at [phone]

## 2024-01-17 NOTE — Plan of Care (Signed)
   Problem: Education: Goal: Knowledge of General Education information will improve Description: Including pain rating scale, medication(s)/side effects and non-pharmacologic comfort measures Outcome: Progressing   Problem: Clinical Measurements: Goal: Ability to maintain clinical measurements within normal limits will improve Outcome: Progressing Goal: Diagnostic test results will improve Outcome: Progressing

## 2024-01-18 ENCOUNTER — Observation Stay (HOSPITAL_COMMUNITY)

## 2024-01-18 DIAGNOSIS — M25552 Pain in left hip: Secondary | ICD-10-CM | POA: Diagnosis not present

## 2024-01-18 DIAGNOSIS — I472 Ventricular tachycardia, unspecified: Secondary | ICD-10-CM | POA: Diagnosis not present

## 2024-01-18 DIAGNOSIS — E860 Dehydration: Secondary | ICD-10-CM | POA: Diagnosis not present

## 2024-01-18 DIAGNOSIS — I130 Hypertensive heart and chronic kidney disease with heart failure and stage 1 through stage 4 chronic kidney disease, or unspecified chronic kidney disease: Secondary | ICD-10-CM | POA: Diagnosis not present

## 2024-01-18 DIAGNOSIS — I5033 Acute on chronic diastolic (congestive) heart failure: Secondary | ICD-10-CM | POA: Diagnosis not present

## 2024-01-18 DIAGNOSIS — R000 Tachycardia, unspecified: Secondary | ICD-10-CM | POA: Diagnosis not present

## 2024-01-18 DIAGNOSIS — E871 Hypo-osmolality and hyponatremia: Secondary | ICD-10-CM | POA: Diagnosis not present

## 2024-01-18 DIAGNOSIS — S8002XA Contusion of left knee, initial encounter: Secondary | ICD-10-CM | POA: Diagnosis not present

## 2024-01-18 DIAGNOSIS — N179 Acute kidney failure, unspecified: Secondary | ICD-10-CM | POA: Diagnosis not present

## 2024-01-18 DIAGNOSIS — S0990XA Unspecified injury of head, initial encounter: Secondary | ICD-10-CM | POA: Diagnosis not present

## 2024-01-18 DIAGNOSIS — S01112A Laceration without foreign body of left eyelid and periocular area, initial encounter: Secondary | ICD-10-CM | POA: Diagnosis not present

## 2024-01-18 DIAGNOSIS — I480 Paroxysmal atrial fibrillation: Secondary | ICD-10-CM | POA: Diagnosis not present

## 2024-01-18 DIAGNOSIS — E785 Hyperlipidemia, unspecified: Secondary | ICD-10-CM | POA: Diagnosis not present

## 2024-01-18 DIAGNOSIS — D649 Anemia, unspecified: Secondary | ICD-10-CM | POA: Diagnosis not present

## 2024-01-18 DIAGNOSIS — N1831 Chronic kidney disease, stage 3a: Secondary | ICD-10-CM | POA: Diagnosis not present

## 2024-01-18 DIAGNOSIS — D72829 Elevated white blood cell count, unspecified: Secondary | ICD-10-CM | POA: Diagnosis not present

## 2024-01-18 DIAGNOSIS — W19XXXA Unspecified fall, initial encounter: Secondary | ICD-10-CM | POA: Diagnosis not present

## 2024-01-18 DIAGNOSIS — I951 Orthostatic hypotension: Secondary | ICD-10-CM | POA: Diagnosis not present

## 2024-01-18 DIAGNOSIS — R9431 Abnormal electrocardiogram [ECG] [EKG]: Secondary | ICD-10-CM

## 2024-01-18 LAB — CBC
HCT: 28.5 % — ABNORMAL LOW (ref 36.0–46.0)
Hemoglobin: 9.7 g/dL — ABNORMAL LOW (ref 12.0–15.0)
MCH: 34 pg (ref 26.0–34.0)
MCHC: 34 g/dL (ref 30.0–36.0)
MCV: 100 fL (ref 80.0–100.0)
Platelets: 234 K/uL (ref 150–400)
RBC: 2.85 MIL/uL — ABNORMAL LOW (ref 3.87–5.11)
RDW: 11.4 % — ABNORMAL LOW (ref 11.5–15.5)
WBC: 11.4 K/uL — ABNORMAL HIGH (ref 4.0–10.5)
nRBC: 0 % (ref 0.0–0.2)

## 2024-01-18 LAB — BASIC METABOLIC PANEL WITH GFR
Anion gap: 9 (ref 5–15)
BUN: 22 mg/dL (ref 8–23)
CO2: 22 mmol/L (ref 22–32)
Calcium: 8.7 mg/dL — ABNORMAL LOW (ref 8.9–10.3)
Chloride: 101 mmol/L (ref 98–111)
Creatinine, Ser: 1.02 mg/dL — ABNORMAL HIGH (ref 0.44–1.00)
GFR, Estimated: 56 mL/min — ABNORMAL LOW (ref >60)
Glucose, Bld: 126 mg/dL — ABNORMAL HIGH (ref 70–99)
Potassium: 3.8 mmol/L (ref 3.5–5.1)
Sodium: 132 mmol/L — ABNORMAL LOW (ref 135–145)

## 2024-01-18 LAB — TSH: TSH: 0.417 u[IU]/mL (ref 0.350–4.500)

## 2024-01-18 LAB — TROPONIN I (HIGH SENSITIVITY): Troponin I (High Sensitivity): 84 ng/L — ABNORMAL HIGH (ref <18)

## 2024-01-18 LAB — PHOSPHORUS: Phosphorus: 1.9 mg/dL — ABNORMAL LOW (ref 2.5–4.6)

## 2024-01-18 LAB — MAGNESIUM: Magnesium: 1.5 mg/dL — ABNORMAL LOW (ref 1.7–2.4)

## 2024-01-18 MED ORDER — K PHOS MONO-SOD PHOS DI & MONO 155-852-130 MG PO TABS
500.0000 mg | ORAL_TABLET | Freq: Two times a day (BID) | ORAL | Status: AC
Start: 2024-01-18 — End: 2024-01-18
  Administered 2024-01-18 (×2): 500 mg via ORAL
  Filled 2024-01-18 (×2): qty 2

## 2024-01-18 MED ORDER — MORPHINE SULFATE (PF) 2 MG/ML IV SOLN
2.0000 mg | Freq: Once | INTRAVENOUS | Status: AC | PRN
  Administered 2024-01-18: 2 mg via INTRAVENOUS
  Filled 2024-01-18: qty 1

## 2024-01-18 MED ORDER — POTASSIUM CHLORIDE CRYS ER 20 MEQ PO TBCR
40.0000 meq | EXTENDED_RELEASE_TABLET | Freq: Once | ORAL | Status: AC
  Administered 2024-01-18: 40 meq via ORAL
  Filled 2024-01-18: qty 2

## 2024-01-18 MED ORDER — LACTATED RINGERS IV BOLUS
500.0000 mL | Freq: Once | INTRAVENOUS | Status: AC
  Administered 2024-01-18: 500 mL via INTRAVENOUS

## 2024-01-18 MED ORDER — PROCHLORPERAZINE EDISYLATE 10 MG/2ML IJ SOLN
10.0000 mg | Freq: Four times a day (QID) | INTRAMUSCULAR | Status: DC | PRN
  Administered 2024-01-20: 10 mg via INTRAVENOUS
  Filled 2024-01-18: qty 2

## 2024-01-18 MED ORDER — MAGNESIUM SULFATE 2 GM/50ML IV SOLN
2.0000 g | Freq: Once | INTRAVENOUS | Status: AC
  Administered 2024-01-18: 2 g via INTRAVENOUS
  Filled 2024-01-18: qty 50

## 2024-01-18 NOTE — TOC Progression Note (Signed)
 Transition of Care [facility]) - Progression Note    Patient Details  Name: ANIS CINELLI MRN: [id number] Date of Birth: 08-May-1941  Transition of Care [facility]) CM/SW Contact  Hoy DELENA Bigness, LCSW Phone Number: 01/18/2024, 2:57 PM  Clinical Narrative:    Pt agreeable to recommendation for HHPT/OT. Pt has not had HH in the past and does not have an agency preference. HHPT/OT has been arranged with [facility]. HH orders will need to be placed prior to discharge. ICM will continue to follow.    Expected Discharge Plan: Home w Home Health Services Barriers to Discharge: Continued Medical Work up               Expected Discharge Plan and Services In-house Referral: Clinical Social Work Discharge Planning Services: NA Post Acute Care Choice: Home Health Living arrangements for the past 2 months: Single Family Home                 DME Arranged: N/A DME Agency: NA       HH Arranged: PT, OT HH Agency: [facility] Date HH Agency Contacted: 01/18/24 Time HH Agency Contacted: 1457 Representative spoke with at [facility] Agency: Camie   Social Drivers of Health (SDOH) Interventions SDOH Screenings   Food Insecurity: No Food Insecurity (01/15/2024)  Housing: Low Risk  (01/15/2024)  Transportation Needs: No Transportation Needs (01/15/2024)  Utilities: Not At Risk (01/15/2024)  Alcohol Screen: Low Risk  (09/18/2022)  Depression (PHQ2-9): Low Risk  (12/13/2023)  Financial Resource Strain: Low Risk  (09/18/2022)  Physical Activity: Sufficiently Active (09/18/2022)  Social Connections: Moderately Isolated (01/15/2024)  Stress: No Stress Concern Present (09/18/2022)  Tobacco Use: Medium Risk (01/16/2024)    Readmission Risk Interventions     No data to display

## 2024-01-18 NOTE — Plan of Care (Signed)
  Problem: Acute Rehab PT Goals(only PT should resolve) Goal: Pt Will Go Supine/Side To Sit Outcome: Progressing Flowsheets (Taken 01/18/2024 1234) Pt will go Supine/Side to Sit: Independently Goal: Patient Will Transfer Sit To/From Stand Outcome: Progressing Flowsheets (Taken 01/18/2024 1234) Patient will transfer sit to/from stand: with modified independence Goal: Pt Will Transfer Bed To Chair/Chair To Bed Outcome: Progressing Flowsheets (Taken 01/18/2024 1234) Pt will Transfer Bed to Chair/Chair to Bed: with modified independence Goal: Pt Will Ambulate Outcome: Progressing Flowsheets (Taken 01/18/2024 1234) Pt will Ambulate:  with least restrictive assistive device  with rolling walker  with supervision  25 feet    12:35 PM, 01/18/24 Rosaria Settler, PT, DPT [place] [facility]

## 2024-01-18 NOTE — Evaluation (Signed)
 Occupational Therapy Evaluation Patient Details Name: [name] MRN: [id number] DOB: [birth year] Today's Date: 01/18/2024   History of Present Illness   [name] is a 83 y.o. female with medical history significant of PAF on anticoagulation and flecainide , ostoporosis, hyperlipidemia, chronic pain on opiates. Patient has a long hystory of orthostasis and has been on midodrine  intermittently over the last year. She was initially taking 2.5mg  TID, but recently had this increased to 5mg  TID on Tuesday. Dispite this, she has continued to have episodes of orthostasis. Today, she had a fall due to the orthostasis. She did not loose consciousness, but she hit her face and chest on the hard floor. She was also experiencing pain in her neck, right arm, left knee and chest. She was brought here for evaluation. She does not eat well or drink a lot of fluids. No fevers, chills, nausea, vomiting, diarrhea, abdominal pain. (per MD)     Clinical Impressions Pt agreeable to OT and PT co-evaluation. Pt is independent at baseline without RW. Today pt required RW for stability when ambulating with CGA. No physical assist needed for bed mobility with HOB slightly elevated as it is at home. L UE limited by pain. Mild deficit in A/ROM for shoulder flexion with increased pain. Pt able to complete lower body dressing with set up assist. CGA for standing tasks due to balance deficits. Pt reports her family can provide 24/7 assist at home. Pt left in the chair with call bell within reach.      If plan is discharge home, recommend the following:   A little help with walking and/or transfers;A little help with bathing/dressing/bathroom;Assistance with cooking/housework;Assist for transportation;Help with stairs or ramp for entrance     Functional Status Assessment   Patient has had a recent decline in their functional status and demonstrates the ability to make significant improvements in function in a  reasonable and predictable amount of time.     Equipment Recommendations   None recommended by OT             Precautions/Restrictions   Precautions Precautions: Fall Recall of Precautions/Restrictions: Intact Restrictions Weight Bearing Restrictions Per Provider Order: No     Mobility Bed Mobility Overal bed mobility: Needs Assistance Bed Mobility: Supine to Sit     Supine to sit: Modified independent (Device/Increase time), Supervision, HOB elevated     General bed mobility comments: mild labored movement    Transfers Overall transfer level: Needs assistance Equipment used: Rolling walker (2 wheels) Transfers: Bed to chair/wheelchair/BSC, Sit to/from Stand Sit to Stand: Contact guard assist, Min assist     Step pivot transfers: Contact guard assist     General transfer comment: labored movement; needing RW due to instability in standing without RW. CGA to min A to stand and sit without RW from EOB.      Balance Overall balance assessment: Needs assistance Sitting-balance support: No upper extremity supported, Feet supported Sitting balance-Leahy Scale: Good Sitting balance - Comments: seated at EOB   Standing balance support: No upper extremity supported, During functional activity Standing balance-Leahy Scale: Poor Standing balance comment: poor without RW; fair with RW                           ADL either performed or assessed with clinical judgement   ADL Overall ADL's : Needs assistance/impaired     Grooming: Contact guard assist;Standing       Lower Body Bathing: Set  up;Sitting/lateral leans       Lower Body Dressing: Set up;Sitting/lateral leans   Toilet Transfer: Contact guard assist;Rolling walker (2 wheels) Toilet Transfer Details (indicate cue type and reason): EOB to chair with RW and CGA. Toileting- Clothing Manipulation and Hygiene: Contact guard assist;Sitting/lateral lean;Sit to/from stand       Functional  mobility during ADLs: Contact guard assist;Rolling walker (2 wheels) General ADL Comments: Ambulated to door and back to chair with RW and CGA.     Vision Baseline Vision/History: 0 No visual deficits Ability to See in Adequate Light: 0 Adequate Patient Visual Report: No change from baseline Vision Assessment?: No apparent visual deficits Additional Comments: L eye swollen.     Perception Perception: Not tested       Praxis Praxis: Not tested       Pertinent Vitals/Pain Pain Assessment Pain Assessment: 0-10 Pain Score: 10-Worst pain ever Pain Location: face, neck, L shoulder and back Pain Descriptors / Indicators: Throbbing Pain Intervention(s): Limited activity within patient's tolerance, Monitored during session, Repositioned     Extremity/Trunk Assessment Upper Extremity Assessment Upper Extremity Assessment: Generalized weakness;LUE deficits/detail;Right hand dominant LUE Deficits / Details: 3-/5 shoulder flexion; 4/5 shoulder abduction; generally weak otherwise. L shoulder pain.   Lower Extremity Assessment Lower Extremity Assessment: Defer to PT evaluation   Cervical / Trunk Assessment Cervical / Trunk Assessment: Kyphotic   Communication Communication Communication: No apparent difficulties   Cognition Arousal: Alert Behavior During Therapy: WFL for tasks assessed/performed Cognition: No apparent impairments                               Following commands: Intact       Cueing  General Comments   Cueing Techniques: Verbal cues;Tactile cues                 Home Living Family/patient expects to be discharged to:: Private residence Living Arrangements: Alone Available Help at Discharge: Family;Available 24 hours/day Type of Home: House Home Access: Level entry     Home Layout: One level     Bathroom Shower/Tub: Chief Strategy Officer: Handicapped height Bathroom Accessibility: Yes How Accessible: Accessible via  wheelchair;Accessible via walker Home Equipment: Rolling Walker (2 wheels);Wheelchair - manual;Grab bars - toilet;Shower seat   Additional Comments: Pt reports her sister can come stay with her 24/7.      Prior Functioning/Environment Prior Level of Function : Independent/Modified Independent             Mobility Comments: Tourist information centre manager without AD ADLs Comments: Independent ADL and IADL; drives    OT Problem List: Decreased strength;Decreased range of motion;Decreased activity tolerance;Impaired balance (sitting and/or standing);Pain   OT Treatment/Interventions: Self-care/ADL training;Therapeutic exercise;DME and/or AE instruction;Therapeutic activities;Patient/family education;Balance training      OT Goals(Current goals can be found in the care plan section)   Acute Rehab OT Goals Patient Stated Goal: return home OT Goal Formulation: With patient Time For Goal Achievement: 02/01/24 Potential to Achieve Goals: Good   OT Frequency:  Min 3X/week    Co-evaluation PT/OT/SLP Co-Evaluation/Treatment: Yes Reason for Co-Treatment: To address functional/ADL transfers   OT goals addressed during session: ADL's and self-care      AM-PAC OT 6 Clicks Daily Activity     Outcome Measure Help from another person eating meals?: A Little Help from another person taking care of personal grooming?: A Little Help from another person toileting, which includes using toliet, bedpan, or  urinal?: A Little Help from another person bathing (including washing, rinsing, drying)?: A Little Help from another person to put on and taking off regular upper body clothing?: None Help from another person to put on and taking off regular lower body clothing?: A Little 6 Click Score: 19   End of Session Equipment Utilized During Treatment: Rolling walker (2 wheels);Gait belt  Activity Tolerance: Patient tolerated treatment well Patient left: in chair;with call bell/phone within reach  OT  Visit Diagnosis: Unsteadiness on feet (R26.81);Other abnormalities of gait and mobility (R26.89);Muscle weakness (generalized) (M62.81);History of falling (Z91.81)                Time: 9064-9040 OT Time Calculation (min): 24 min Charges:  OT General Charges $OT Visit: 1 Visit OT Evaluation $OT Eval Low Complexity: 1 Low  Ebone Alcivar OT, MOT   Jayson Person 01/18/2024, 11:58 AM

## 2024-01-18 NOTE — Progress Notes (Signed)
   01/18/24 0057  Assess: MEWS Score  Temp 98.1 F (36.7 C)  BP 137/63  MAP (mmHg) 79  ECG Heart Rate (!) 127  Resp 16  Level of Consciousness Alert  SpO2 98 %  O2 Device Room Air  Assess: MEWS Score  MEWS Temp 0  MEWS Systolic 0  MEWS Pulse 2  MEWS RR 0  MEWS LOC 0  MEWS Score 2  MEWS Score Color Yellow  Assess: if the MEWS score is Yellow or Red  Were vital signs accurate and taken at a resting state? Yes  Does the patient meet 2 or more of the SIRS criteria? No  MEWS guidelines implemented  Yes, yellow  Treat  MEWS Interventions Considered administering scheduled or prn medications/treatments as ordered  Take Vital Signs  Increase Vital Sign Frequency  Yellow: Q2hr x1, continue Q4hrs until patient remains green for 12hrs  Escalate  MEWS: Escalate Yellow: Discuss with charge nurse and consider notifying provider and/or RRT  Notify: Charge Nurse/RN  Name of Charge Nurse/RN Notified Truett Ned, RN  Provider Notification  Provider Name/Title Dr. Manfred  Assess: SIRS CRITERIA  SIRS Temperature  0  SIRS Respirations  0  SIRS Pulse 1  SIRS WBC 0  SIRS Score Sum  1

## 2024-01-18 NOTE — Progress Notes (Signed)
 Mobility Specialist Progress Note:    01/18/24 1000  Mobility  Activity Pivoted/transferred from bed to chair;Pivoted/transferred from chair to bed  Level of Assistance Contact guard assist, steadying assist  Assistive Device Front wheel walker  Distance Ambulated (ft) 5 ft  Range of Motion/Exercises Active;All extremities  Activity Response Tolerated well  Mobility Referral Yes  Mobility visit 1 Mobility  Mobility Specialist Start Time (ACUTE ONLY) 1000  Mobility Specialist Stop Time (ACUTE ONLY) 1020  Mobility Specialist Time Calculation (min) (ACUTE ONLY) 20 min   Pt received in chair, requesting new chair where the recliner works. Required CGA with RW to stand and transfer to bed. Tolerated well, c/o dizziness transferring back to chair. NT in room, all needs met.  Taygen Newsome Mobility Specialist Please contact via Special educational needs teacher or  Rehab office at [phone]

## 2024-01-18 NOTE — Plan of Care (Signed)

## 2024-01-18 NOTE — Consult Note (Signed)
 Cardiology Consultation   Patient ID: [name] MRN: [id number]; DOB: 17-Sep-1940  Admit date: 01/15/2024 Date of Consult: 01/18/2024  PCP:  Alphonsa Glendia LABOR, MD   [facility] Providers Cardiologist:  Alvan Carrier, MD      Patient Profile: [name] is a 83 y.o. female with a hx of PAF (on anticoagulation and flecainide ), osteoporosis, hyperlipidemia, chronic pain on opiates, long hystory of orthostasis (on midodrine  intermittently) who is being seen 01/18/2024 for the evaluation of orthostasis and wide complex tachycardia at the request of  Dr. Darci.  History of Present Illness: [name] was last seen in heart care OV 10/20/2023 by Dr. Alvan for follow-up.  Reported intermittent low BPs and dizziness at times.  Increase midodrine  to 3 times daily.  Encouraged to increase hydration, electrolyte, and sodium intake.  Lexiscan  2019 showed low risk study, no signs of infarct or ischemia, EF 55 to 60%.  48-hour Holter monitor 10/2017 showed predominantly NSR, average HR 66, HR ranging 49-1 02, rare PVCs/PACs. 48-hour Holter monitor 08/2018 showed predominantly NSR, average HR 78, HR ranging 50-103, rare PVCs/PACs ECHO 09/2022 showed EF 60 to 65%, mild MV regurgitation, trivial AV regurgitation, mild AV calcification  Presented to [facility] ED 9/6 for a fall due to orthostasis. Abnormal labs include NA 129, hemoglobin 11.4, WBC 15 with 12.5K neutrophil count Normal labs include K 4.1, CR 0.8 EKG: 9/6: NSR, HR 80, RBBB, QTc 463 ms,  9/9: NSR, HR 80, RBBB, Qtc 509 ms (no ischemic changes.)  Imaging was all negative for acute injury.  Last night are RN notified MD that patient's HR increased to 130s while assisting patient to bathroom and on returning to bed, telemetry showed wide-complex tachycardia with HR in 120s.  After about 5 minutes, RN noted that patient self converted to sinus tachycardia for about 30 minutes and was back in wide-complex rhythm in the 120s.  Significant labs  included K 3.8, Mg 1.5, phosphorus 1.9, Hgb 9.7, WBC 11.4. Also noted 10 out of 10 back pain that was treated with IV morphine .  Tele: Ventricular Tachycardia 9/9 at 0:28 -03:23 with HR 110-130 then converted to NSR.   On interview, patient denied any associated symptoms before/during/after ventricular tachycardia including no palpitations, SOB, chest pain, dizziness, presyncope, diaphoresis, confusion, edema.  She reports only feeling nauseous this morning since waking up. .   Also discussed symptoms prior to ED arrival.  Patient reports usual feeling associated with low BP prior to the fall including lightheadedness and dizziness.  Denied any chest pain, palpitation, SOB or syncope.  Notes that her home BP continue to fluctuate with occasional low BPs. She drinks approximately 4-5 bottles of water  per day. She is able to complete housework and walk around grocery store without any concerns   Past Medical History:  Diagnosis Date   Adenocarcinoma, colon (HCC) 03/2005   Stage I; resected in 03/2005   Arthritis    Colon cancer [facility])    Removed surgicaly    Dysrhythmia    Gastroesophageal reflux disease    Gastroparesis    Hearing loss    mild   Hyperlipidemia    Irregular heartbeat    Ovarian cyst 2008   Pain in joint of right shoulder 06/29/2017   Pain in joint, ankle and foot 03/13/2014   Paroxysmal atrial fibrillation (HCC)    Palpitations; maintained on flecainide ; -normal coronary angiography and 1999; negative stress nuclear study in 11/2005   PONV (postoperative nausea and vomiting)  Tobacco abuse, in remission    10-pack-years; quit in 2001    Past Surgical History:  Procedure Laterality Date   ABDOMINAL HYSTERECTOMY  1983   BACK SURGERY     BIOPSY  10/18/2017   Procedure: BIOPSY;  Surgeon: Golda Claudis PENNER, MD;  Location[facility];  Service: Endoscopy;;  gastric   BIOPSY  07/06/2018   Procedure: BIOPSY;  Surgeon: Golda Claudis PENNER, MD;  Location[facility];   Service: Endoscopy;;  gastric   CATARACT EXTRACTION W/PHACO Right 12/22/2021   Procedure: CATARACT EXTRACTION PHACO AND INTRAOCULAR LENS PLACEMENT (IOC);  Surgeon: Harrie Agent, MD;  Location: [facility];  Service: Ophthalmology;  Laterality: Right;  CDE: 13.86   CATARACT EXTRACTION W/PHACO Left 01/05/2022   Procedure: CATARACT EXTRACTION PHACO AND INTRAOCULAR LENS PLACEMENT (IOC);  Surgeon: Harrie Agent, MD;  Location: [facility];  Service: Ophthalmology;  Laterality: Left;  CDE 21.39   CHOLECYSTECTOMY     COLON SURGERY  2006   Colon cancer   COLONOSCOPY  03/25/2012   Claudis PENNER Golda, MD   COLONOSCOPY N/A 04/26/2017   Procedure: COLONOSCOPY;  Surgeon: Golda Claudis PENNER, MD;  [facility];  Service: Endoscopy;  Laterality: N/A;  2:00   COLONOSCOPY N/A 07/06/2018   Procedure: COLONOSCOPY;  Surgeon: Golda Claudis PENNER, MD;  [facility];  Service: Endoscopy;  Laterality: N/A;  10:30   COLONOSCOPY WITH PROPOFOL  N/A 09/10/2021   Procedure: COLONOSCOPY WITH PROPOFOL ;  Surgeon: Golda Claudis PENNER, MD;  [facility];  Service: Endoscopy;  Laterality: N/A;  910   COSMETIC SURGERY  09/2007   ESOPHAGOGASTRODUODENOSCOPY N/A 10/18/2017   Procedure: ESOPHAGOGASTRODUODENOSCOPY (EGD);  Surgeon: Golda Claudis PENNER, MD;  [facility];  Service: Endoscopy;  Laterality: N/A;   ESOPHAGOGASTRODUODENOSCOPY N/A 07/06/2018   Procedure: ESOPHAGOGASTRODUODENOSCOPY (EGD);  Surgeon: Golda Claudis PENNER, MD;  [facility];  Service: Endoscopy;  Laterality: N/A;   GIVENS CAPSULE STUDY N/A 12/26/2019   Procedure: GIVENS CAPSULE STUDY;  Surgeon: Golda Claudis PENNER, MD;  [facility];  Service: Endoscopy;  Laterality: N/A;  730   PARTIAL COLECTOMY  2006   adenocarcinoma   POLYPECTOMY  04/26/2017   Procedure: POLYPECTOMY;  Surgeon: Golda Claudis PENNER, MD;  [facility];  Service: Endoscopy;;  colon   POLYPECTOMY  07/06/2018   Procedure: POLYPECTOMY;  Surgeon: Golda Claudis PENNER, MD;  [facility];  Service: Endoscopy;;  colon    POLYPECTOMY  09/10/2021   Procedure: POLYPECTOMY;  Surgeon: Golda Claudis PENNER, MD;  [facility];  Service: Endoscopy;;  transverse x2;sigmoid   TONSILLECTOMY       Home Medications:  Prior to Admission medications   Medication Sig Start Date End Date Taking? Authorizing Provider  alendronate  (FOSAMAX ) 70 MG tablet TAKE ONE TABLET (70 MG TOTAL) BY MOUTH EVERY 7 DAYS. TAKE WITH A FULL GLASS OF WATER  ON AN EMPTY STOMACH. Patient taking differently: Take 70 mg by mouth once a week. 09/03/23  Yes Alphonsa Glendia LABOR, MD  apixaban  (ELIQUIS ) 2.5 MG TABS tablet Take 1 tablet (2.5 mg total) by mouth 2 (two) times daily. 03/03/23  Yes BranchDorn FALCON, MD  Calcium  Carbonate (CALCIUM  600 PO) Take 600 mg by mouth daily.   Yes [provider]  CONSTULOSE  10 GM/15ML solution TAKE 30ML'S (20 GRAMS TOTAL) BY MOUTH ATBEDTIME Patient taking differently: Take 20 g by mouth at bedtime. 12/02/23  Yes Alphonsa Glendia LABOR, MD  ferrous sulfate 325 (  65 FE) MG tablet Take 325 mg by mouth daily with breakfast.   Yes [provider]  flecainide  (TAMBOCOR ) 100 MG tablet TAKE ONE TABLET (100MG  TOTAL) BY MOUTH EVERY 12 HOURS Patient taking differently: Take 100 mg by mouth 2 (two) times daily. 08/27/23  Yes Branch, Dorn FALCON, MD  HYDROcodone -acetaminophen  (NORCO) 10-325 MG tablet One qid prn pain Patient taking differently: Take 1 tablet by mouth 4 (four) times daily as needed for moderate pain (pain score 4-6). 12/13/23  Yes Mauro Elveria BROCKS, NP  midodrine  (PROAMATINE ) 5 MG tablet Take 1 tablet (5 mg total) by mouth 3 (three) times daily with meals. 01/03/24  Yes BranchDorn FALCON, MD  omeprazole (PRILOSEC) 20 MG capsule Take 20 mg by mouth daily before breakfast.    Yes [provider]  rosuvastatin  (CRESTOR ) 40 MG tablet TAKE ONE TABLET (40MG  TOTAL) BY MOUTH DAILY Patient taking differently: Take 40 mg by mouth at bedtime.  03/19/23  Yes BranchDorn FALCON, MD  vitamin B-12 (CYANOCOBALAMIN ) 1000 MCG tablet Take 1,000 mcg by mouth daily.   Yes [provider]  HYDROcodone -acetaminophen  (NORCO) 10-325 MG tablet 1 taken 4 times daily as needed for pain Patient not taking: Reported on 01/16/2024 12/13/23   Mauro Elveria BROCKS, NP  HYDROcodone -acetaminophen  [facility]) 10-325 MG tablet One qid prn pain Patient not taking: Reported on 01/16/2024 12/13/23   Mauro Elveria BROCKS, NP    Scheduled Meds:  apixaban   2.5 mg Oral BID   feeding supplement  237 mL Oral BID BM   flecainide   100 mg Oral BID   lactulose   20 g Oral QHS   midodrine   5 mg Oral TID WC   pantoprazole   40 mg Oral Daily   phosphorus  500 mg Oral BID   rosuvastatin   40 mg Oral Daily   Continuous Infusions:  sodium chloride      PRN Meds: HYDROcodone -acetaminophen , morphine  injection, prochlorperazine   Allergies:   No Known Allergies  Social History:   Social History   Socioeconomic History   Marital status: Widowed    Spouse name: Not on file   Number of children: Not on file   Years of education: Not on file   Highest education level: Not on file  Occupational History   Occupation: Catering manager  Tobacco Use   Smoking status: Former    Current packs/day: 0.00    Average packs/day: 0.3 packs/day for 40.0 years (12.0 ttl pk-yrs)    Types: Cigarettes    Start date: 02/23/1963    Quit date: 03/25/2002    Years since quitting: 21.8   Smokeless tobacco: Never   Tobacco comments:    Quit smoking in 2001  Vaping Use   Vaping status: Never Used  Substance and Sexual Activity   Alcohol use: No    Alcohol/week: 0.0 standard drinks of alcohol   Drug use: No   Sexual activity: Not Currently    Birth control/protection: Surgical, Abstinence  Other Topics Concern   Not on file  Social History Narrative   10-04-22: husband passed away ~ 5 weeks ago      Lives with husband in a one story home.  Has 3 children.  Retired from Warehouse manager work.   Education: some college.   Family History:    Family History  Problem Relation Age of Onset   Heart disease Mother    Stroke Mother    Heart attack Mother    Cancer Father        lung   Heart  disease Father    Hypertension Father    Cancer Brother        rectal   Healthy Son    Heart failure Sister    Stroke Sister      ROS:  Please see the history of present illness.   All other ROS reviewed and negative.     Physical Exam/Data: Vitals:   01/18/24 0114 01/18/24 0129 01/18/24 0252 01/18/24 0350  BP: (!) 147/57 137/60 (!) 125/52 (!) 127/53  Pulse:    95  Resp:   18 18  Temp:   98 F (36.7 C) 98.6 F (37 C)  TempSrc:   Oral Oral  SpO2:   95% 91%  Weight:      Height:        Intake/Output Summary (Last 24 hours) at 01/18/2024 1035 Last data filed at 01/18/2024 0420 Gross per 24 hour  Intake 474.15 ml  Output 300 ml  Net 174.15 ml      01/15/2024   10:10 PM 12/13/2023    1:21 PM 10/20/2023    3:36 PM  Last 3 Weights  Weight (lbs) 105 lb 2.6 oz 105 lb 105 lb 3.2 oz  Weight (kg) 47.7 kg 47.628 kg 47.718 kg     Body mass index is 19.23 kg/m.  General:  Elderly white thin female in no acute distress HEENT: bruise over left eye/cheek, swelling to left eye Neck: no JVD Vascular: No carotid bruits; Distal pulses 2+ bilaterally Cardiac:  normal S1, S2; RRR; no murmur  Lungs:  clear to auscultation bilaterally, no wheezing, rhonchi or rales  Abd: soft, nontender, no hepatomegaly  Ext: no edema Musculoskeletal:  No deformities, BUE and BLE strength normal and equal Skin: warm and dry  Neuro:  CNs 2-12 intact, no focal abnormalities noted Psych:  Normal affect   EKG:  The EKG was personally reviewed and demonstrates:   9/6: NSR, HR 80, RBBB  9/9: NSR, HR 80, RBBB  Telemetry:  Telemetry was personally reviewed and demonstrates:  Ventricular Tachycardia 9/9 at 0:28 -03:23 with HR 110-130 then converted to NSR.   Relevant CV Studies: Lexiscan  2019  There was no ST  segment deviation noted during stress. The study is normal. There are no perfusion defects consistent with prior infarct or current ischemia. This is a low risk study. The left ventricular ejection fraction is normal (55-65%).  Holter monitor 2020 48 hr holter monitor Min HR 50, Max HR 103, Avg HR 68 Rare ventricular ectopy, 2 isolated PVCs Rare supraventricular ectopy. Primarily PACs. Three short runs of atach, longest 4 beats. Reported symptoms correlated with sinus rhythm   ECHO 09/2022 IMPRESSIONS   1. Left ventricular ejection fraction, by estimation, is 60 to 65%. The  left ventricle has normal function. The left ventricle has no regional  wall motion abnormalities. Left ventricular diastolic parameters are  indeterminate.   2. Right ventricular systolic function is normal. The right ventricular  size is normal. There is normal pulmonary artery systolic pressure.   3. The mitral valve is normal in structure. Mild mitral valve  regurgitation. No evidence of mitral stenosis.   4. The aortic valve is tricuspid. There is mild calcification of the  aortic valve. Aortic valve regurgitation is trivial. No aortic stenosis is  present.   5. The inferior vena cava is normal in size with greater than 50%  respiratory variability, suggesting right atrial pressure of 3 mmHg.   Comparison(s): No prior Echocardiogram.   Laboratory Data: High Sensitivity Troponin:  No results for input(s): TROPONINIHS in the last 720 hours.   Chemistry Recent Labs  Lab 01/15/24 1604 01/16/24 0417 01/18/24 0258  NA 129* 132* 132*  K 4.1 3.5 3.8  CL 96* 99 101  CO2 22 22 22   GLUCOSE 118* 101* 126*  BUN 14 13 22   CREATININE 0.80 0.78 1.02*  CALCIUM  9.3 8.6* 8.7*  MG  --   --  1.5*  GFRNONAA >60 >60 56*  ANIONGAP 11 11 9     Recent Labs  Lab 01/15/24 1604  PROT 6.6  ALBUMIN 3.9  AST 18  ALT 12  ALKPHOS 43  BILITOT 0.7   Lipids No results for input(s): CHOL, TRIG, HDL, LABVLDL,  LDLCALC, CHOLHDL in the last 168 hours.  Hematology Recent Labs  Lab 01/15/24 1604 01/16/24 0417 01/18/24 0258  WBC 15.0* 11.1* 11.4*  RBC 3.34* 3.00* 2.85*  HGB 11.4* 10.3* 9.7*  HCT 32.7* 30.2* 28.5*  MCV 97.9 100.7* 100.0  MCH 34.1* 34.3* 34.0  MCHC 34.9 34.1 34.0  RDW 11.1* 11.5 11.4*  PLT 277 265 234   Thyroid  No results for input(s): TSH, FREET4 in the last 168 hours.  BNPNo results for input(s): BNP, PROBNP in the last 168 hours.  DDimer No results for input(s): DDIMER in the last 168 hours.  Radiology/Studies:  DG Knee Complete 4 Views Left Result Date: 01/15/2024 CLINICAL DATA:  Left knee pain after fall today. EXAM: LEFT KNEE - COMPLETE 4+ VIEW COMPARISON:  April 27, 2023. FINDINGS: No evidence of fracture, dislocation, or joint effusion. No evidence of arthropathy or other focal bone abnormality. Soft tissues are unremarkable. IMPRESSION: Negative. Electronically Signed   By: Lynwood Landy Raddle M.D.   On: 01/15/2024 14:19   DG Chest 2 View Result Date: 01/15/2024 CLINICAL DATA:  Chest pain after fall today. EXAM: CHEST - 2 VIEW COMPARISON:  November 20, 2023. FINDINGS: The heart size and mediastinal contours are within normal limits. Both lungs are clear. The visualized skeletal structures are unremarkable. IMPRESSION: No active cardiopulmonary disease. Electronically Signed   By: Lynwood Landy Raddle M.D.   On: 01/15/2024 14:18   CT Maxillofacial Wo Contrast Result Date: 01/15/2024 CLINICAL DATA:  Status post fall. EXAM: CT MAXILLOFACIAL WITHOUT CONTRAST TECHNIQUE: Multidetector CT imaging of the maxillofacial structures was performed. Multiplanar CT image reconstructions were also generated. RADIATION DOSE REDUCTION: This exam was performed according to the departmental dose-optimization program which includes automated exposure control, adjustment of the mA and/or kV according to patient size and/or use of iterative reconstruction technique. COMPARISON:  None Available.  FINDINGS: Osseous: No fracture or mandibular dislocation. No destructive process. Orbits: Negative. No traumatic or inflammatory finding. Sinuses: Clear. Soft tissues: There is marked severity left-sided facial soft tissue swelling. An associated 1.6 cm x 2.7 cm left-sided facial hematoma is noted. Mild to moderate severity lateral left periorbital soft tissue swelling is present. Limited intracranial: No significant or unexpected finding. IMPRESSION: 1. Marked severity left-sided facial soft tissue swelling with an associated 1.6 cm x 2.7 cm left-sided facial hematoma. 2. Mild to moderate severity lateral left periorbital soft tissue swelling. 3. No acute facial bone fracture. Electronically Signed   By: Suzen Dials M.D.   On: 01/15/2024 13:49   CT Cervical Spine Wo Contrast Result Date: 01/15/2024 CLINICAL DATA:  Status post fall. EXAM: CT CERVICAL SPINE WITHOUT CONTRAST TECHNIQUE: Multidetector CT imaging of the cervical spine was performed without intravenous contrast. Multiplanar CT image reconstructions were also generated. RADIATION DOSE REDUCTION: This exam was performed  according to the departmental dose-optimization program which includes automated exposure control, adjustment of the mA and/or kV according to patient size and/or use of iterative reconstruction technique. COMPARISON:  April 27, 2023 FINDINGS: Alignment: There is approximately 2 mm stepwise anterolisthesis of the C4 vertebral body on C5 and C5 vertebral body on C6. Skull base and vertebrae: No acute fracture. No primary bone lesion or focal pathologic process. Soft tissues and spinal canal: No prevertebral fluid or swelling. No visible canal hematoma. Disc levels: Mild multilevel endplate sclerosis is seen throughout the cervical spine with very mild anterior osteophyte formation and posterior bony spurring noted at the levels of C4-C5 and C5-C6. There is mild multilevel intervertebral disc space narrowing throughout the cervical  spine. Marked severity bilateral cavernous carotid artery calcification is noted. Upper chest: There is moderate to marked severity biapical scarring and/or atelectasis. Other: None. IMPRESSION: 1. No acute cervical spine fracture. 2. Multilevel degenerative changes, as described above. 3. Moderate to marked severity biapical scarring and/or atelectasis. Electronically Signed   By: Suzen Dials M.D.   On: 01/15/2024 13:44   CT Head Wo Contrast Result Date: 01/15/2024 CLINICAL DATA:  Status post fall. EXAM: CT HEAD WITHOUT CONTRAST TECHNIQUE: Contiguous axial images were obtained from the base of the skull through the vertex without intravenous contrast. RADIATION DOSE REDUCTION: This exam was performed according to the departmental dose-optimization program which includes automated exposure control, adjustment of the mA and/or kV according to patient size and/or use of iterative reconstruction technique. COMPARISON:  None Available. FINDINGS: Brain: There is mild generalized cerebral atrophy with widening of the extra-axial spaces and ventricular dilatation. There are areas of decreased attenuation within the white matter tracts of the supratentorial brain, consistent with microvascular disease changes. Vascular: No hyperdense vessel or unexpected calcification. Skull: Normal. Negative for fracture or focal lesion. Sinuses/Orbits: No acute finding. Other: There is marked severity left-sided facial soft tissue swelling. An associated 1.7 cm x 2.3 cm left-sided facial hematoma is seen. IMPRESSION: 1. Marked severity left-sided facial soft tissue swelling with an associated 1.7 cm x 2.3 cm left-sided facial hematoma. 2. No acute intracranial abnormality. Electronically Signed   By: Suzen Dials M.D.   On: 01/15/2024 13:39   DG Humerus Left Result Date: 01/15/2024 CLINICAL DATA:  Left arm pain after fall today. EXAM: LEFT HUMERUS - 2+ VIEW COMPARISON:  May 26, 2021. FINDINGS: There is no evidence of  acute fracture or dislocation. Old healed fracture involving proximal left humeral head is noted. IMPRESSION: No acute abnormality seen. Electronically Signed   By: Lynwood Landy Raddle M.D.   On: 01/15/2024 13:26     Assessment and Plan: Wide-complex ventricular tachycardia Hypokalemia, hypomagnesemia, hypophosphatemia Lexiscan  2019 showed low risk study, no signs of infarct or ischemia, EF 55 to 60%.  48-hour Holter monitor 10/2017 showed predominantly NSR, average HR 66, HR ranging 49-1 02, rare PVCs/PACs. 48-hour Holter monitor 08/2018 showed predominantly NSR, average HR 78, HR ranging 50-103, rare PVCs/PACs ECHO 09/2022 showed EF 60 to 65%, mild MV regurgitation, trivial AV regurgitation, mild AV calcification. Order ECHO.  Denies any associated symptoms during this episode.  EKG: 9/6: NSR, HR 80, RBBB, QTc 463 ms,  9/9: NSR, HR 80, RBBB, Qtc 509 ms (no ischemic changes.)  Tele: Ventricular Tachycardia 9/9 at 0:28 -03:23 with HR 110-130 then self converted to NSR.  K 3.8, Mg 1.5, phosphorus 1.9. Order TSH and TN.  Treated with supplemental Kcl, IV mag sulfate, and PO4. Continue to monitor.  Would consider  2 week zio monitor at discharge.   Orthostasis Complicated by dehydration Last Orthostatic vitals signs on 9/8 was negative.  Most recent BP 127/53  Continue midodrine  5 mg 3 times daily Continue abdominal binder  PAF  No signs of recurrent afib Continue Eliquis  2.5 twice daily, flecainide  100 mg twice daily  HLD  Continue on Crestor  40 mg daily  Risk Assessment/Risk Scores:  CHA2DS2-VASc Score = 4   This indicates a 4.8% annual risk of stroke. The patient's score is based upon: CHF History: 0 HTN History: 1 Diabetes History: 0 Stroke History: 0 Vascular Disease History: 0 Age Score: 2 Gender Score: 1      For questions or updates, please contact [facility] Please consult [email or web address] for contact info under    Signed, [name], PA-C  01/18/2024  10:35 AM

## 2024-01-18 NOTE — Progress Notes (Addendum)
 Progress Note  RN called due to patient's HR being 130s, she states that patient was assisted to the restroom and on returning to bed, telemetry showed wide-complex tachycardia with HR in 120s.  She was also reported to have 10/10 back pain.  IV morphine  2 mg x 1 was ordered and RN was advised to allow patients to rest for about 45 minutes and recheck vital signs. After about 5 minutes, RN states that patient self converted to sinus tach for about 30 minutes and is back in the wide complex rhythm in the 120s.   BP 137/60   Pulse 81   Temp 98.1 F (36.7 C)   Resp 16   Ht 5' 2 (1.575 m)   Wt 47.7 kg   SpO2 98%   BMI 19.23 kg/m   EKG was ordered and showed sinus tachycardia with occasional PVCs with LAD.  QTc 531 ms  Assessment and plan BMP and magnesium  levels were checked.  Potassium was replenished  Hypomagnesemia Magnesium  1.5, this was replenished Repeat EKG later in the morning  Prolonged QT interval QTc 531 ms Avoid QT prolonging drugs Magnesium  level will be checked Repeat EKG in the morning   Please refer to admission H&P and progress notes for details regarding the care of this patient.  Total time:  42 minutes This includes time reviewing the chart including progress notes, labs, EKGs, taking medical decisions, ordering labs and documenting findings.

## 2024-01-18 NOTE — Evaluation (Signed)
 Physical Therapy Evaluation Patient Details Name: VALI CAPANO MRN: [id number] DOB: 02-Jan-1941 Today's Date: 01/18/2024  History of Present Illness  SAMANI DEAL is a 83 y.o. female with medical history significant of PAF on anticoagulation and flecainide , ostoporosis, hyperlipidemia, chronic pain on opiates. Patient has a long hystory of orthostasis and has been on midodrine  intermittently over the last year. She was initially taking 2.5mg  TID, but recently had this increased to 5mg  TID on Tuesday. Dispite this, she has continued to have episodes of orthostasis. Today, she had a fall due to the orthostasis. She did not loose consciousness, but she hit her face and chest on the hard floor. She was also experiencing pain in her neck, right arm, left knee and chest. She was brought here for evaluation. She does not eat well or drink a lot of fluids. No fevers, chills, nausea, vomiting, diarrhea, abdominal pain.   Clinical Impression  Patient agreeable with OT/PT co-evaluation. Patient reports at baseline she is independent with all mobility, and ADL/iADLs as she lives alone and does not use any assistive equipment. Pt reports longer history of BP issues but this fall being her only ever fall. On this date, pt is mod independent with bed mobility but requires min assist with transfers and ambulation without RW due to increased shakiness and unsteadiness. With use of RW pt demo improvements but CGA given for safety as pt demo altered gait, and labored movement. PT limited most due to inc pain, general weakness,and fatigue, all of which she would cont to benefit from skilled physical therapy acutely and in recommended venue to address. Pt reports she has a sister who can stay with her 24/7 immediately following discharged. Heavily encouraged use of RW and having someone with pt anytime she is ambulating once discharged from hospital for safety. Patient tolerated sitting in chair at end of session, call button  within reach.   Orthostatics taken during session: Supine/semi-reclined: 122/50 Seated EOB: 116/59 Standing: 107/59 Returned to sitting after ambulation: 120/52    If plan is discharge home, recommend the following: A little help with walking and/or transfers;Assist for transportation   Can travel by private vehicle        Equipment Recommendations None recommended by PT  Recommendations for Other Services       Functional Status Assessment Patient has had a recent decline in their functional status and demonstrates the ability to make significant improvements in function in a reasonable and predictable amount of time.     Precautions / Restrictions Precautions Precautions: Fall Recall of Precautions/Restrictions: Intact Restrictions Weight Bearing Restrictions Per Provider Order: No      Mobility  Bed Mobility Overal bed mobility: Needs Assistance Bed Mobility: Supine to Sit     Supine to sit: Modified independent (Device/Increase time), Supervision, HOB elevated     General bed mobility comments: HOB elevated to mimic home set up. inc time for labored movement due to pain    Transfers Overall transfer level: Needs assistance Equipment used: Rolling walker (2 wheels), None Transfers: Sit to/from Stand, Bed to chair/wheelchair/BSC Sit to Stand: Contact guard assist, Min assist   Step pivot transfers: Contact guard assist       General transfer comment: first attempt at stand without AD, pt demo inc shakiness and unsteadiness, min assist required. second stand with RW, improvements with steadiness, CGA given. CGA during bed to chair for safety.    Ambulation/Gait Ambulation/Gait assistance: Contact guard assist Gait Distance (Feet): 20 Feet Assistive device: Rolling  walker (2 wheels) Gait Pattern/deviations: Step-through pattern, Decreased step length - right, Decreased step length - left, Decreased stride length, Trunk flexed Gait velocity: Dec      General Gait Details: Pt demo very short steps bilaterally, slow labored movement and mild unsteadiness warranting CGA during ambulation with RW in room. Limited due to fatigue and general weakness. Pt reports no dizziness prior to or during ambulation. But reports mild dizziness once returned to sitting in chair. vitals: BP- 120/52, HR- 79 bpm.  Stairs            Wheelchair Mobility     Tilt Bed    Modified Rankin (Stroke Patients Only)       Balance Overall balance assessment: Needs assistance Sitting-balance support: No upper extremity supported, Feet supported Sitting balance-Leahy Scale: Good Sitting balance - Comments: seated at EOB   Standing balance support: No upper extremity supported, During functional activity, Bilateral upper extremity supported Standing balance-Leahy Scale: Fair Standing balance comment: poor without RW; fair with RW           Pertinent Vitals/Pain Pain Assessment Pain Assessment: 0-10 Pain Score: 10-Worst pain ever Pain Location: reports pain in face, neck, L shoulder and low back Pain Descriptors / Indicators: Throbbing Pain Intervention(s): Limited activity within patient's tolerance, Monitored during session, Repositioned    Home Living Family/patient expects to be discharged to:: Private residence Living Arrangements: Alone Available Help at Discharge: Family;Available 24 hours/day Type of Home: House Home Access: Level entry       Home Layout: One level Home Equipment: Agricultural consultant (2 wheels);Wheelchair - manual;Grab bars - toilet;Shower seat Additional Comments: Pt reports she has a sister that lives very close by and who can also stay with her 24/7 once she leaves the hospital. can assist physically if needed    Prior Function Prior Level of Function : Independent/Modified Independent;Driving             Mobility Comments: Community ambulator without AD ADLs Comments: Independent ADL and IADL; drives      Extremity/Trunk Assessment   Upper Extremity Assessment Upper Extremity Assessment: Defer to OT evaluation LUE Deficits / Details: 3-/5 shoulder flexion; 4/5 shoulder abduction; generally weak otherwise. L shoulder pain.    Lower Extremity Assessment Lower Extremity Assessment: Generalized weakness;LLE deficits/detail (RLE generally weak but WFL) LLE Deficits / Details: ankle DF MMT 4/5, hip flexion 3+ at best. limited mostly due to pain post-fall. no notable bruising on LLE. LLE: Unable to fully assess due to pain LLE Coordination: decreased gross motor    Cervical / Trunk Assessment Cervical / Trunk Assessment: Kyphotic  Communication   Communication Communication: No apparent difficulties    Cognition Arousal: Alert Behavior During Therapy: WFL for tasks assessed/performed   PT - Cognitive impairments: No apparent impairments       Following commands: Intact       Cueing Cueing Techniques: Verbal cues, Tactile cues, Visual cues     General Comments      Exercises     Assessment/Plan    PT Assessment Patient needs continued PT services;All further PT needs can be met in the next venue of care  PT Problem List Decreased strength;Decreased range of motion;Decreased activity tolerance;Decreased balance;Decreased mobility;Pain       PT Treatment Interventions DME instruction;Balance training;Gait training;Functional mobility training;Therapeutic activities;Therapeutic exercise;Patient/family education    PT Goals (Current goals can be found in the Care Plan section)  Acute Rehab PT Goals Patient Stated Goal: return home with HHPT services PT  Goal Formulation: With patient Time For Goal Achievement: 01/18/24 Potential to Achieve Goals: Good    Frequency Min 3X/week     Co-evaluation PT/OT/SLP Co-Evaluation/Treatment: Yes Reason for Co-Treatment: To address functional/ADL transfers PT goals addressed during session: Mobility/safety with mobility OT  goals addressed during session: ADL's and self-care       AM-PAC PT 6 Clicks Mobility  Outcome Measure Help needed turning from your back to your side while in a flat bed without using bedrails?: None Help needed moving from lying on your back to sitting on the side of a flat bed without using bedrails?: None Help needed moving to and from a bed to a chair (including a wheelchair)?: A Little Help needed standing up from a chair using your arms (e.g., wheelchair or bedside chair)?: A Little Help needed to walk in hospital room?: A Little Help needed climbing 3-5 steps with a railing? : A Little 6 Click Score: 20    End of Session Equipment Utilized During Treatment: Gait belt Activity Tolerance: Patient tolerated treatment well;Patient limited by fatigue;Patient limited by pain Patient left: in chair;with call bell/phone within reach   PT Visit Diagnosis: Unsteadiness on feet (R26.81);Other abnormalities of gait and mobility (R26.89);History of falling (Z91.81);Muscle weakness (generalized) (M62.81);Pain Pain - Right/Left: Left Pain - part of body: Shoulder;Arm;Leg    Time: 0934-1000 PT Time Calculation (min) (ACUTE ONLY): 26 min   Charges:   PT Evaluation $PT Eval Moderate Complexity: 1 Mod   PT General Charges $$ ACUTE PT VISIT: 1 Visit         12:32 PM, 01/18/24 Shahmeer Bunn Powell-Butler, PT, DPT [place] with [facility]

## 2024-01-18 NOTE — Progress Notes (Signed)
 Patient converted back to the wide complex tachycardia rhythm. Dr. Manfred notified. EKG performed. See new orders.

## 2024-01-18 NOTE — Progress Notes (Signed)
 Progress Note   Patient: [name] FMW:[id number] DOB: [birth year] DOA: 01/15/2024     0 DOS: the patient was seen and examined on 01/18/2024   Brief hospital course: LIZZA HUFFAKER is a 83 y.o. female with medical history significant of PAF on anticoagulation and flecainide , ostoporosis, hyperlipidemia, chronic pain on opiates, long hystory of orthostasis and has been on midodrine  intermittently over the last year presented with fall.  She is admitted to hospitalist service for evaluation of fall due to orthostasis.  Assessment and Plan: Orthostasis S/p Fall, head trauma- Long h/o orthostasis, complicated by dehydration. Dizzy while getting out of bed. Did complain of left hip pain today. Echo from 2024 reviewed-No valvular abnormalities, EF 60%. Continue abdominal binder. Advised out of bed to chair. PT/ OT. Left hip xray ordered. Continue midodrine  5mg  tid.   Ventricular tachycardia- Continue telemetry monitoring. Cardiology consulted. Will follow recommendations. Continue to monitor electrolytes and replete accordingly.  Paroxysmal atrial fibrillation Continue Flecainide  100mg  bid. Continue eliquis .  Risks and benefits of anticoagulation discussed with high risk of falls.  Hyponatremia In the setting of dehydration. Stable, 132 today. Trend sodium.  Leukocytosis Reactive in the setting of dehydration, fall Trend CBC.  Debility- Generalized weakness- Due to poor oral intake. Lives alone, PT/ OT follow up. Encourage out of bed to chair.    Incentive spirometry. Nursing supportive care. Fall, aspiration precautions. Diet:  Diet Orders (From admission, onward)     Start     Ordered   01/15/24 2213  Diet Heart Room service appropriate? Yes; Fluid consistency: Thin  Diet effective now       Question Answer Comment  Room service appropriate? Yes   Fluid consistency: Thin      01/15/24 2212           DVT prophylaxis: apixaban  (ELIQUIS ) tablet 2.5 mg Start:  01/15/24 2300 apixaban  (ELIQUIS ) tablet 2.5 mg  Level of care: Telemetry   Code Status: Full Code  Subjective: Patient is seen and examined today morning. She is lying in bed. Did get out of bed yesterday with tachycardia on telemetry. Complains of left hip pain. Dizziness persists.  Physical Exam: Vitals:   01/18/24 0129 01/18/24 0252 01/18/24 0350 01/18/24 1347  BP: 137/60 (!) 125/52 (!) 127/53 (!) 134/53  Pulse:   95 71  Resp:  18 18 18   Temp:  98 F (36.7 C) 98.6 F (37 C) 97.7 F (36.5 C)  TempSrc:  Oral Oral Oral  SpO2:  95% 91% 100%  Weight:      Height:        General - Elderly Caucasian thin built weak female, distress due to pain. HEENT - PERRLA, EOMI, bruise over left eyelid, cheek noted, non tender sinuses. Lung - Clear, basal rales, no rhonchi, wheezes. Heart - S1, S2 heard, no murmurs, rubs, no pedal edema. Abdomen - Soft, non tender, bowel sounds good Neuro - Alert, awake and oriented x 3, non focal exam. Skin - Warm and dry.  Data Reviewed:      Latest Ref Rng & Units 01/18/2024    2:58 AM 01/16/2024    4:17 AM 01/15/2024    4:04 PM  CBC  WBC 4.0 - 10.5 K/uL 11.4  11.1  15.0   Hemoglobin 12.0 - 15.0 g/dL 9.7  89.6  88.5   Hematocrit 36.0 - 46.0 % 28.5  30.2  32.7   Platelets 150 - 400 K/uL 234  265  277       Latest Ref  Rng & Units 01/18/2024    2:58 AM 01/16/2024    4:17 AM 01/15/2024    4:04 PM  BMP  Glucose 70 - 99 mg/dL 873  898  881   BUN 8 - 23 mg/dL 22  13  14    Creatinine 0.44 - 1.00 mg/dL 8.97  9.21  9.19   Sodium 135 - 145 mmol/L 132  132  129   Potassium 3.5 - 5.1 mmol/L 3.8  3.5  4.1   Chloride 98 - 111 mmol/L 101  99  96   CO2 22 - 32 mmol/L 22  22  22    Calcium  8.9 - 10.3 mg/dL 8.7  8.6  9.3    No results found.  Family Communication: Discussed with patient, she understand and agree. All questions answered.  Disposition: Status is: Observation The patient remains OBS appropriate and will d/c before 2 midnights.  Planned Discharge  Destination: Home with Home Health     Time spent: 46 minutes  Author: Concepcion Riser, MD 01/18/2024 2:21 PM Secure chat 7am to 7pm For on call review [email or web address].

## 2024-01-18 NOTE — Plan of Care (Signed)
  Problem: Acute Rehab OT Goals (only OT should resolve) Goal: Pt. Will Perform Grooming Flowsheets (Taken 01/18/2024 1200) Pt Will Perform Grooming:  Independently  standing Goal: Pt. Will Perform Lower Body Dressing Flowsheets (Taken 01/18/2024 1200) Pt Will Perform Lower Body Dressing: Independently Goal: Pt. Will Transfer To Toilet Flowsheets (Taken 01/18/2024 1200) Pt Will Transfer to Toilet:  Independently  ambulating Goal: Pt. Will Perform Toileting-Clothing Manipulation Flowsheets (Taken 01/18/2024 1200) Pt Will Perform Toileting - Clothing Manipulation and hygiene: Independently Goal: Pt/Caregiver Will Perform Home Exercise Program Flowsheets (Taken 01/18/2024 1200) Pt/caregiver will Perform Home Exercise Program:  Increased ROM  Increased strength  Right Upper extremity  Left upper extremity  Independently  Lexii Walsh OT, MOT

## 2024-01-18 NOTE — Progress Notes (Signed)
 Received call from telemetry that the patients vtach alarm was going off but there was a lot of artifact. RN went to room to find patient on the toilet with NT. Patient alert and oriented and c/o back pain. Patient returned to bed and rhythm continued. EKG attempted however patient was controllably shaking and even with 3 staff members trying to keep her still, the EKG did not read. However, it appeared she was in wide complex tachycardia in the 130's. Dr. Manfred notified of the above events. Per Dr. Adefeso, since pt has history of being orthostatic, he wanted us  to watch patient for next 45 minutes to see if there was an improvement. Patient has self converted back to sinus tachycardia in the 110's after approximately 30 minutes.

## 2024-01-19 ENCOUNTER — Observation Stay (HOSPITAL_COMMUNITY)

## 2024-01-19 ENCOUNTER — Other Ambulatory Visit (HOSPITAL_COMMUNITY): Payer: Self-pay | Admitting: *Deleted

## 2024-01-19 DIAGNOSIS — I951 Orthostatic hypotension: Secondary | ICD-10-CM | POA: Diagnosis not present

## 2024-01-19 DIAGNOSIS — T462X5A Adverse effect of other antidysrhythmic drugs, initial encounter: Secondary | ICD-10-CM

## 2024-01-19 DIAGNOSIS — I4891 Unspecified atrial fibrillation: Secondary | ICD-10-CM

## 2024-01-19 DIAGNOSIS — I4729 Other ventricular tachycardia: Secondary | ICD-10-CM | POA: Diagnosis not present

## 2024-01-19 DIAGNOSIS — R000 Tachycardia, unspecified: Secondary | ICD-10-CM | POA: Diagnosis not present

## 2024-01-19 DIAGNOSIS — E785 Hyperlipidemia, unspecified: Secondary | ICD-10-CM | POA: Diagnosis not present

## 2024-01-19 DIAGNOSIS — I480 Paroxysmal atrial fibrillation: Secondary | ICD-10-CM | POA: Diagnosis not present

## 2024-01-19 LAB — ECHOCARDIOGRAM COMPLETE
AR max vel: 1.16 cm2
AV Area VTI: 1.2 cm2
AV Area mean vel: 1.2 cm2
AV Mean grad: 7 mmHg
AV Peak grad: 14.3 mmHg
Ao pk vel: 1.89 m/s
Area-P 1/2: 3.37 cm2
Height: 62 in
S' Lateral: 2.4 cm
Weight: 1682.55 [oz_av]

## 2024-01-19 LAB — BASIC METABOLIC PANEL WITH GFR
Anion gap: 8 (ref 5–15)
BUN: 22 mg/dL (ref 8–23)
CO2: 22 mmol/L (ref 22–32)
Calcium: 8 mg/dL — ABNORMAL LOW (ref 8.9–10.3)
Chloride: 101 mmol/L (ref 98–111)
Creatinine, Ser: 1.07 mg/dL — ABNORMAL HIGH (ref 0.44–1.00)
GFR, Estimated: 53 mL/min — ABNORMAL LOW (ref >60)
Glucose, Bld: 122 mg/dL — ABNORMAL HIGH (ref 70–99)
Potassium: 4.3 mmol/L (ref 3.5–5.1)
Sodium: 131 mmol/L — ABNORMAL LOW (ref 135–145)

## 2024-01-19 LAB — CBC
HCT: 25.5 % — ABNORMAL LOW (ref 36.0–46.0)
Hemoglobin: 8.3 g/dL — ABNORMAL LOW (ref 12.0–15.0)
MCH: 33.2 pg (ref 26.0–34.0)
MCHC: 32.5 g/dL (ref 30.0–36.0)
MCV: 102 fL — ABNORMAL HIGH (ref 80.0–100.0)
Platelets: 223 K/uL (ref 150–400)
RBC: 2.5 MIL/uL — ABNORMAL LOW (ref 3.87–5.11)
RDW: 11.9 % (ref 11.5–15.5)
WBC: 14 K/uL — ABNORMAL HIGH (ref 4.0–10.5)
nRBC: 0 % (ref 0.0–0.2)

## 2024-01-19 LAB — MAGNESIUM: Magnesium: 1.9 mg/dL (ref 1.7–2.4)

## 2024-01-19 LAB — PHOSPHORUS: Phosphorus: 3.4 mg/dL (ref 2.5–4.6)

## 2024-01-19 MED ORDER — OXYCODONE HCL 5 MG PO TABS
10.0000 mg | ORAL_TABLET | Freq: Four times a day (QID) | ORAL | Status: DC | PRN
Start: 2024-01-19 — End: 2024-01-21
  Administered 2024-01-19 – 2024-01-21 (×9): 10 mg via ORAL
  Filled 2024-01-19 (×11): qty 2

## 2024-01-19 MED ORDER — METHOCARBAMOL 500 MG PO TABS
750.0000 mg | ORAL_TABLET | Freq: Three times a day (TID) | ORAL | Status: DC | PRN
  Administered 2024-01-19 – 2024-01-26 (×14): 750 mg via ORAL
  Filled 2024-01-19 (×16): qty 2

## 2024-01-19 MED ORDER — ACETAMINOPHEN 500 MG PO TABS
1000.0000 mg | ORAL_TABLET | Freq: Three times a day (TID) | ORAL | Status: DC
Start: 2024-01-19 — End: 2024-01-23
  Administered 2024-01-19 – 2024-01-23 (×12): 1000 mg via ORAL
  Filled 2024-01-19 (×12): qty 2

## 2024-01-19 NOTE — Plan of Care (Signed)

## 2024-01-19 NOTE — Progress Notes (Signed)
 Progress Note   Patient: [name] FMW:[id number] DOB: 08-11-1940 DOA: 01/15/2024     0 DOS: the patient was seen and examined on 01/19/2024   Brief hospital course: [name] is a 83 y.o. female with medical history significant of PAF on anticoagulation and flecainide , ostoporosis, hyperlipidemia, chronic pain on opiates, long hystory of orthostasis and has been on midodrine  intermittently over the last year presented with fall.  She is admitted to hospitalist service for evaluation of fall due to orthostasis.  Assessment and Plan: Orthostasis S/p Fall, head trauma- -Long h/o orthostasis, complicated by dehydration. -Continue the use of midodrine  3 times a day - 2D echo demonstrating preserved ejection fraction, no wall motion normalities and no significant valvular disorder. - Continue as needed analgesics.  Ventricular tachycardia- -Continue telemetry monitoring - Follow electrolytes and replete as needed (goal is for potassium above 4 and magnesium  above 2).  Paroxysmal atrial fibrillation Continue Eliquis  for secondary prevention - Following cardiology recommendation Flecainide  has been discontinued - Planning for event monitoring after discharge.  Hyponatremia In the setting of dehydration - Patient advised to maintain adequate oral intake - Continue to follow electrolytes.  Leukocytosis Reactive/stress demargination - No signs of acute infection currently appreciated.  Debility- Generalized weakness- Due to poor oral intake. Lives alone, appreciate assistance, evaluation and recommendation by PT/OT.  Planning for home health services at discharge. Patient has been encouraged to continue to participate with mobility specialist for out of bed activities and to sit couple hours at a time on the chair. Continue the use of incentive spirometry. Continue supportive care.  Diet:  Diet Orders (From admission, onward)     Start     Ordered   01/15/24 2213  Diet  Heart Room service appropriate? Yes; Fluid consistency: Thin  Diet effective now       Question Answer Comment  Room service appropriate? Yes   Fluid consistency: Thin      01/15/24 2212           DVT prophylaxis: apixaban  (ELIQUIS ) tablet 2.5 mg Start: 01/15/24 2300 apixaban  (ELIQUIS ) tablet 2.5 mg  Level of care: Telemetry   Code Status: Full Code  Subjective: No chest pain, no nausea, no vomiting.  Reports no syncope or lightheadedness pain.  Expressed some intermittent skipping a beat sensation; but her telemetry and vital signs has been stable.  Complaining of diffuse pain and experiencing mild distress.  Physical Exam: Vitals:   01/19/24 0331 01/19/24 0808 01/19/24 1208 01/19/24 1444  BP: (!) 145/53 (!) 140/57 119/60 (!) 114/58  Pulse: 91 80 76 73  Resp: 18 18 20 19   Temp: 98.8 F (37.1 C) 98.6 F (37 C) 98.2 F (36.8 C) 97.6 F (36.4 C)  TempSrc: Oral Oral Oral Oral  SpO2: 91%  93% 99%  Weight:      Height:       General exam: Alert, awake, oriented x 3; reporting generalized pain and expressed not feeling good. Respiratory system: No wheezing, no crackles; no use of accessory measures and demonstrating good saturation on room air Cardiovascular system:Rate controlled, no rubs, no gallops, no JVD. Gastrointestinal system: Abdomen is nondistended, soft and nontender. No organomegaly or masses felt. Normal bowel sounds heard. Central nervous system: Moving 4 limbs spontaneously; no focal neurological deficits. Extremities: No cyanosis or clubbing Skin: No petechiae.  Significant facial bruises appreciated. Psychiatry: Judgement and insight appear normal. Mood & affect appropriate.    Data Reviewed:      Latest Ref Rng &  Units 01/19/2024    4:43 AM 01/18/2024    2:58 AM 01/16/2024    4:17 AM  CBC  WBC 4.0 - 10.5 K/uL 14.0  11.4  11.1   Hemoglobin 12.0 - 15.0 g/dL 8.3  9.7  89.6   Hematocrit 36.0 - 46.0 % 25.5  28.5  30.2   Platelets 150 - 400 K/uL 223  234  265        Latest Ref Rng & Units 01/19/2024    4:43 AM 01/18/2024    2:58 AM 01/16/2024    4:17 AM  BMP  Glucose 70 - 99 mg/dL 877  873  898   BUN 8 - 23 mg/dL 22  22  13    Creatinine 0.44 - 1.00 mg/dL 8.92  8.97  9.21   Sodium 135 - 145 mmol/L 131  132  132   Potassium 3.5 - 5.1 mmol/L 4.3  3.8  3.5   Chloride 98 - 111 mmol/L 101  101  99   CO2 22 - 32 mmol/L 22  22  22    Calcium  8.9 - 10.3 mg/dL 8.0  8.7  8.6    ECHOCARDIOGRAM COMPLETE Result Date: 01/19/2024    ECHOCARDIOGRAM REPORT   Patient Name:   [name] Date of Exam: 01/19/2024 Medical Rec #:  [id number]     Height:       62.0 in Accession #:    [id number]    Weight:       105.2 lb Date of Birth:  Apr 09, 1941    BSA:          1.455 m Patient Age:    82 years      BP:           140/57 mmHg Patient Gender: F             HR:           80 bpm. Exam Location:  [name] Procedure: 2D Echo, Cardiac Doppler and Color Doppler (Both Spectral and Color            Flow Doppler were utilized during procedure). Indications:    Ventricular Tachycardia l47.2  History:        Patient has prior history of Echocardiogram examinations, most                 recent 09/24/2022. Arrythmias:Atrial Fibrillation; Risk                 Factors:Dyslipidemia, Former Smoker and Pre-diabetes. Hx of                 COVID-19.  Sonographer:    Aida Pizza RCS Referring Phys: [id number] [name] IMPRESSIONS  1. Left ventricular ejection fraction, by estimation, is 60 to 65%. The left ventricle has normal function. The left ventricle has no regional wall motion abnormalities. Left ventricular diastolic parameters were normal.  2. Right ventricular systolic function is normal. The right ventricular size is normal. There is normal pulmonary artery systolic pressure. The estimated right ventricular systolic pressure is 33.2 mmHg.  3. A small pericardial effusion is present. The pericardial effusion is posterior to the left ventricle.  4. The mitral valve is grossly normal. Mild  mitral valve regurgitation.  5. Tricuspid valve regurgitation is moderate.  6. The aortic valve has an indeterminant number of cusps, cannot exclude functionally bicuspid valve. Aortic valve regurgitation is not visualized. No aortic stenosis is present. Aortic valve mean gradient measures 7.0 mmHg. Calification noted  within LVOT and annulus.  7. The inferior vena cava is normal in size with greater than 50% respiratory variability, suggesting right atrial pressure of 3 mmHg. Comparison(s): Prior images reviewed side by side. LVEF 60-65%. Possible functionally bicuspid aortic valve without stenosis. FINDINGS  Left Ventricle: Left ventricular ejection fraction, by estimation, is 60 to 65%. The left ventricle has normal function. The left ventricle has no regional wall motion abnormalities. The left ventricular internal cavity size was normal in size. There is  borderline left ventricular hypertrophy. Left ventricular diastolic parameters were normal. Right Ventricle: The right ventricular size is normal. No increase in right ventricular wall thickness. Right ventricular systolic function is normal. There is normal pulmonary artery systolic pressure. The tricuspid regurgitant velocity is 2.75 m/s, and  with an assumed right atrial pressure of 3 mmHg, the estimated right ventricular systolic pressure is 33.2 mmHg. Left Atrium: Left atrial size was normal in size. Right Atrium: Right atrial size was normal in size. Pericardium: A small pericardial effusion is present. The pericardial effusion is posterior to the left ventricle. Mitral Valve: The mitral valve is grossly normal. Mild mitral valve regurgitation. Tricuspid Valve: The tricuspid valve is grossly normal. Tricuspid valve regurgitation is moderate. Aortic Valve: The aortic valve has an indeterminant number of cusps. Aortic valve regurgitation is not visualized. No aortic stenosis is present. Aortic valve mean gradient measures 7.0 mmHg. Aortic valve peak  gradient measures 14.3 mmHg. Aortic valve area, by VTI measures 1.20 cm. Pulmonic Valve: The pulmonic valve was grossly normal. Pulmonic valve regurgitation is trivial. Aorta: The aortic root is normal in size and structure. Venous: The inferior vena cava is normal in size with greater than 50% respiratory variability, suggesting right atrial pressure of 3 mmHg. IAS/Shunts: No atrial level shunt detected by color flow Doppler. Additional Comments: 3D was performed not requiring image post processing on an independent workstation and was indeterminate.  LEFT VENTRICLE PLAX 2D LVIDd:         4.20 cm   Diastology LVIDs:         2.40 cm   LV e' medial:    12.10 cm/s LV PW:         1.00 cm   LV E/e' medial:  9.0 LV IVS:        0.90 cm   LV e' lateral:   14.20 cm/s LVOT diam:     1.60 cm   LV E/e' lateral: 7.7 LV SV:         53 LV SV Index:   37 LVOT Area:     2.01 cm  RIGHT VENTRICLE RV S prime:     14.00 cm/s TAPSE (M-mode): 2.8 cm LEFT ATRIUM             Index        RIGHT ATRIUM           Index LA diam:        2.90 cm 1.99 cm/m   RA Area:     12.60 cm LA Vol (A2C):   20.8 ml 14.30 ml/m  RA Volume:   25.70 ml  17.67 ml/m LA Vol (A4C):   32.2 ml 22.14 ml/m LA Biplane Vol: 26.1 ml 17.94 ml/m  AORTIC VALVE AV Area (Vmax):    1.16 cm AV Area (Vmean):   1.20 cm AV Area (VTI):     1.20 cm AV Vmax:           189.00 cm/s AV Vmean:  125.000 cm/s AV VTI:            0.444 m AV Peak Grad:      14.3 mmHg AV Mean Grad:      7.0 mmHg LVOT Vmax:         109.00 cm/s LVOT Vmean:        74.900 cm/s LVOT VTI:          0.265 m LVOT/AV VTI ratio: 0.60  AORTA Ao Root diam: 2.80 cm MITRAL VALVE                TRICUSPID VALVE MV Area (PHT): 3.37 cm     TR Peak grad:   30.2 mmHg MV Decel Time: 225 msec     TR Vmax:        275.00 cm/s MV E velocity: 109.00 cm/s MV A velocity: 88.00 cm/s   SHUNTS MV E/A ratio:  1.24         Systemic VTI:  0.26 m                             Systemic Diam: 1.60 cm Jayson Sierras MD  Electronically signed by Jayson Sierras MD Signature Date/Time: 01/19/2024/3:00:38 PM    Final    DG HIP UNILAT WITH PELVIS 2-3 VIEWS LEFT Result Date: 01/18/2024 CLINICAL DATA:  Fall, hip pain EXAM: DG HIP (WITH OR WITHOUT PELVIS) 2-3V LEFT COMPARISON:  06/03/2021 FINDINGS: Bony demineralization. Mild spurring of the left femoral head and acetabulum. No definite cortical discontinuity to indicate fracture of the left proximal femur or adjacent bony pelvis. IMPRESSION: 1. No fracture identified. If the patient is unable to bear weight or if there is a high clinical index of suspicion of occult fracture, cross-sectional imaging may be warranted for further characterization. 2. Mild degenerative spurring of the left femoral head and acetabulum. 3. Bony demineralization. Electronically Signed   By: Ryan Salvage M.D.   On: 01/18/2024 18:06    Family Communication: Discussed with patient, she understand and agree. All questions answered. No family present during today's evaluation.  Disposition: Status is: Observation The patient remains OBS appropriate and will d/c before 2 midnights.   Planned Discharge Destination: Home with Home Health   Time spent: 35 minutes  Author: Eric Nunnery, MD 01/19/2024 9:16 PM Secure chat 7am to 7pm For on call review [email or web address].

## 2024-01-19 NOTE — Progress Notes (Signed)
*  PRELIMINARY RESULTS* Echocardiogram 2D Echocardiogram has been performed.  [name] 01/19/2024, 1:45 PM

## 2024-01-19 NOTE — Progress Notes (Signed)
 Rounding Note   Patient Name: [name] Date of Encounter: 01/19/2024  [facility] Cardiologist: Alvan Carrier, MD   Subjective Report constant palpitations skipping a beat since waking up this morning. She is concerned this is due to d/c flecainide . She never felt palpitations when on flecainide . Denies Iightheadness, dizziness, chest pain, SOB, edema. Patient is also in severe back pain during interview.  Will notify nurse.   Scheduled Meds:  apixaban   2.5 mg Oral BID   feeding supplement  237 mL Oral BID BM   lactulose   20 g Oral QHS   midodrine   5 mg Oral TID WC   pantoprazole   40 mg Oral Daily   rosuvastatin   40 mg Oral Daily   Continuous Infusions:  sodium chloride      PRN Meds: HYDROcodone -acetaminophen , prochlorperazine    Vital Signs  Vitals:   01/18/24 1347 01/18/24 2006 01/19/24 0331 01/19/24 0808  BP: (!) 134/53 130/60 (!) 145/53 (!) 140/57  Pulse: 71 75 91 80  Resp: 18 18 18 18   Temp: 97.7 F (36.5 C) 99.4 F (37.4 C) 98.8 F (37.1 C) 98.6 F (37 C)  TempSrc: Oral Oral Oral Oral  SpO2: 100% 100% 91%   Weight:      Height:        Intake/Output Summary (Last 24 hours) at 01/19/2024 0849 Last data filed at 01/19/2024 0331 Gross per 24 hour  Intake --  Output 700 ml  Net -700 ml      01/15/2024   10:10 PM 12/13/2023    1:21 PM 10/20/2023    3:36 PM  Last 3 Weights  Weight (lbs) 105 lb 2.6 oz 105 lb 105 lb 3.2 oz  Weight (kg) 47.7 kg 47.628 kg 47.718 kg      Telemetry NSR, HR 70-90 with episode on 9/9 1340-1343 that look like SVT vs Sinus tachycardia with artifact.  - Personally Reviewed   Physical Exam  GEN: Elderly white thin female laying in bed on side in severe back pain;  bruise over left eye/cheek, swelling to left eye  Neck: No JVD Cardiac: RRR, no murmurs, rubs, or gallops.  Respiratory: Clear to auscultation bilaterally. GI: Soft, nontender, non-distended  MS: No edema; No deformity. Neuro:  Nonfocal  Psych: Normal  affect   Labs High Sensitivity Troponin:   Recent Labs  Lab 01/18/24 1158  TROPONINIHS 84*     Chemistry Recent Labs  Lab 01/15/24 1604 01/16/24 0417 01/18/24 0258 01/19/24 0443  NA 129* 132* 132* 131*  K 4.1 3.5 3.8 4.3  CL 96* 99 101 101  CO2 22 22 22 22   GLUCOSE 118* 101* 126* 122*  BUN 14 13 22 22   CREATININE 0.80 0.78 1.02* 1.07*  CALCIUM  9.3 8.6* 8.7* 8.0*  MG  --   --  1.5* 1.9  PROT 6.6  --   --   --   ALBUMIN 3.9  --   --   --   AST 18  --   --   --   ALT 12  --   --   --   ALKPHOS 43  --   --   --   BILITOT 0.7  --   --   --   GFRNONAA >60 >60 56* 53*  ANIONGAP 11 11 9 8     Lipids No results for input(s): CHOL, TRIG, HDL, LABVLDL, LDLCALC, CHOLHDL in the last 168 hours.  Hematology Recent Labs  Lab 01/16/24 0417 01/18/24 0258 01/19/24 0443  WBC 11.1* 11.4*  14.0*  RBC 3.00* 2.85* 2.50*  HGB 10.3* 9.7* 8.3*  HCT 30.2* 28.5* 25.5*  MCV 100.7* 100.0 102.0*  MCH 34.3* 34.0 33.2  MCHC 34.1 34.0 32.5  RDW 11.5 11.4* 11.9  PLT 265 234 223   Thyroid   Recent Labs  Lab 01/18/24 1158  TSH 0.417    BNPNo results for input(s): BNP, PROBNP in the last 168 hours.  DDimer No results for input(s): DDIMER in the last 168 hours.   Radiology  DG HIP UNILAT WITH PELVIS 2-3 VIEWS LEFT Result Date: 01/18/2024 CLINICAL DATA:  Fall, hip pain EXAM: DG HIP (WITH OR WITHOUT PELVIS) 2-3V LEFT COMPARISON:  06/03/2021 FINDINGS: Bony demineralization. Mild spurring of the left femoral head and acetabulum. No definite cortical discontinuity to indicate fracture of the left proximal femur or adjacent bony pelvis. IMPRESSION: 1. No fracture identified. If the patient is unable to bear weight or if there is a high clinical index of suspicion of occult fracture, cross-sectional imaging may be warranted for further characterization. 2. Mild degenerative spurring of the left femoral head and acetabulum. 3. Bony demineralization. Electronically Signed   By: Ryan Salvage M.D.   On: 01/18/2024 18:06    Cardiac Studies ECHO Pending  Patient Profile   83 y.o. female with a hx of PAF (on anticoagulation and flecainide ), osteoporosis, hyperlipidemia, chronic pain on opiates, long hystory of orthostasis (on midodrine  intermittently) who is being seen 01/18/2024 for the evaluation of orthostasis and wide complex tachycardia at the request of  Dr. Darci.   Assessment & Plan  Wide-complex ventricular tachycardia vs LBBB rate related aberrancy  Hypokalemia, hypomagnesemia, hypophosphatemia Palpitations Lexiscan  2019 showed low risk study, no signs of infarct or ischemia, EF 55 to 60%.  48-hour Holter monitor 10/2017 showed predominantly NSR, average HR 66, HR ranging 49-1 02, rare PVCs/PACs. 48-hour Holter monitor 08/2018 showed predominantly NSR, average HR 78, HR ranging 50-103, rare PVCs/PACs ECHO 09/2022 showed EF 60 to 65%, mild MV regurgitation, trivial AV regurgitation, mild AV calcification. ECHO pending.  EKG: 9/6: NSR, HR 80, RBBB, QTc 463 ms,  9/9: NSR, HR 80, RBBB, Qtc 509 ms (no ischemic changes.)  Tele review 9/9: V Tachy vs LBBB rate related aberrancy 9/9 at 0:28 -03:23 with HR 110-130 then self converted to NSR. Denies any associated symptoms during this episode. Per Dr. Mallipeddi, most likely rate related aberrancy but can not rule VT since no 12 lead EKG at that time. D/C Flecainide  9/9 due to arrhythmias.  Treated with supplemental Kcl, IV mag sulfate, and PO4. This am K 4.3, Mg 1.9, phosphorus 3.4. Goal K>3 and <4, Mg>2 and <3. Continue to monitor.  Tele review 9/10: NSR, HR 70-90 with episode on 9/9 1340-1343 that look like SVT but is artifact. TSH WNL, Tn 84.  This am, report constant palpitations skipping a beat since waking up this morning. Sh suspects secondary to d/c flecainide . She never felt palpitations when on flecainide . Patient is also in severe back pain at this time. Will notify nurse.  Per Dr. Mallipeddi, will need EP  referral for loop recorder placement. Will message scheduling.   Orthostatic hypotension Complicated by dehydration Last Orthostatic vitals signs on 9/8 was negative.  Most recent BP 140/57.  Repeat orthostatics not complete. Will message nurse.  Continue midodrine  5 mg 3 times daily Continue abdominal binder   PAF  No signs of recurrent afib Continue Eliquis  2.5 twice daily,  D/C flecainide   on 9/9 as above.    HLD  Continue  on Crestor  40 mg daily     For questions or updates, please contact [facility] Please consult [email or web address] for contact info under     Signed, [name], PA-C  01/19/2024, 8:49 AM

## 2024-01-20 ENCOUNTER — Telehealth: Payer: Self-pay | Admitting: *Deleted

## 2024-01-20 ENCOUNTER — Other Ambulatory Visit

## 2024-01-20 DIAGNOSIS — N1831 Chronic kidney disease, stage 3a: Secondary | ICD-10-CM | POA: Diagnosis not present

## 2024-01-20 DIAGNOSIS — E785 Hyperlipidemia, unspecified: Secondary | ICD-10-CM | POA: Diagnosis not present

## 2024-01-20 DIAGNOSIS — I951 Orthostatic hypotension: Secondary | ICD-10-CM | POA: Diagnosis not present

## 2024-01-20 DIAGNOSIS — I5033 Acute on chronic diastolic (congestive) heart failure: Secondary | ICD-10-CM | POA: Diagnosis not present

## 2024-01-20 DIAGNOSIS — T462X5A Adverse effect of other antidysrhythmic drugs, initial encounter: Secondary | ICD-10-CM | POA: Diagnosis not present

## 2024-01-20 DIAGNOSIS — D649 Anemia, unspecified: Secondary | ICD-10-CM | POA: Diagnosis not present

## 2024-01-20 DIAGNOSIS — N179 Acute kidney failure, unspecified: Secondary | ICD-10-CM | POA: Diagnosis not present

## 2024-01-20 DIAGNOSIS — I480 Paroxysmal atrial fibrillation: Secondary | ICD-10-CM | POA: Diagnosis not present

## 2024-01-20 DIAGNOSIS — I472 Ventricular tachycardia, unspecified: Secondary | ICD-10-CM | POA: Diagnosis not present

## 2024-01-20 DIAGNOSIS — R000 Tachycardia, unspecified: Secondary | ICD-10-CM | POA: Diagnosis not present

## 2024-01-20 DIAGNOSIS — E860 Dehydration: Secondary | ICD-10-CM | POA: Diagnosis not present

## 2024-01-20 DIAGNOSIS — R55 Syncope and collapse: Secondary | ICD-10-CM

## 2024-01-20 DIAGNOSIS — I130 Hypertensive heart and chronic kidney disease with heart failure and stage 1 through stage 4 chronic kidney disease, or unspecified chronic kidney disease: Secondary | ICD-10-CM | POA: Diagnosis not present

## 2024-01-20 DIAGNOSIS — S01112A Laceration without foreign body of left eyelid and periocular area, initial encounter: Secondary | ICD-10-CM | POA: Diagnosis not present

## 2024-01-20 DIAGNOSIS — S8002XA Contusion of left knee, initial encounter: Secondary | ICD-10-CM | POA: Diagnosis not present

## 2024-01-20 DIAGNOSIS — E871 Hypo-osmolality and hyponatremia: Secondary | ICD-10-CM | POA: Diagnosis not present

## 2024-01-20 LAB — CBC
HCT: 28.6 % — ABNORMAL LOW (ref 36.0–46.0)
Hemoglobin: 9.3 g/dL — ABNORMAL LOW (ref 12.0–15.0)
MCH: 33.7 pg (ref 26.0–34.0)
MCHC: 32.5 g/dL (ref 30.0–36.0)
MCV: 103.6 fL — ABNORMAL HIGH (ref 80.0–100.0)
Platelets: 224 K/uL (ref 150–400)
RBC: 2.76 MIL/uL — ABNORMAL LOW (ref 3.87–5.11)
RDW: 11.7 % (ref 11.5–15.5)
WBC: 14.4 K/uL — ABNORMAL HIGH (ref 4.0–10.5)
nRBC: 0 % (ref 0.0–0.2)

## 2024-01-20 LAB — MAGNESIUM: Magnesium: 1.9 mg/dL (ref 1.7–2.4)

## 2024-01-20 LAB — BASIC METABOLIC PANEL WITH GFR
Anion gap: 11 (ref 5–15)
BUN: 22 mg/dL (ref 8–23)
CO2: 23 mmol/L (ref 22–32)
Calcium: 7.9 mg/dL — ABNORMAL LOW (ref 8.9–10.3)
Chloride: 96 mmol/L — ABNORMAL LOW (ref 98–111)
Creatinine, Ser: 1.1 mg/dL — ABNORMAL HIGH (ref 0.44–1.00)
GFR, Estimated: 51 mL/min — ABNORMAL LOW (ref >60)
Glucose, Bld: 131 mg/dL — ABNORMAL HIGH (ref 70–99)
Potassium: 4.4 mmol/L (ref 3.5–5.1)
Sodium: 130 mmol/L — ABNORMAL LOW (ref 135–145)

## 2024-01-20 LAB — PHOSPHORUS: Phosphorus: 3.3 mg/dL (ref 2.5–4.6)

## 2024-01-20 MED ORDER — HEPARIN (PORCINE) 25000 UT/250ML-% IV SOLN
750.0000 [IU]/h | INTRAVENOUS | Status: DC
  Administered 2024-01-20: 600 [IU]/h via INTRAVENOUS
  Filled 2024-01-20: qty 250

## 2024-01-20 MED ORDER — ASPIRIN 81 MG PO CHEW
81.0000 mg | CHEWABLE_TABLET | ORAL | Status: AC
  Administered 2024-01-21: 81 mg via ORAL
  Filled 2024-01-20: qty 1

## 2024-01-20 MED ORDER — FREE WATER
500.0000 mL | Freq: Once | Status: AC
  Administered 2024-01-21: 500 mL via ORAL

## 2024-01-20 MED ORDER — HYDROMORPHONE HCL 1 MG/ML IJ SOLN
0.5000 mg | Freq: Once | INTRAMUSCULAR | Status: AC
  Administered 2024-01-20: 0.5 mg via INTRAVENOUS
  Filled 2024-01-20: qty 0.5

## 2024-01-20 MED ORDER — METOPROLOL TARTRATE 25 MG PO TABS
25.0000 mg | ORAL_TABLET | Freq: Two times a day (BID) | ORAL | Status: DC
Start: 2024-01-20 — End: 2024-01-24
  Administered 2024-01-20 – 2024-01-24 (×9): 25 mg via ORAL
  Filled 2024-01-20 (×9): qty 1

## 2024-01-20 NOTE — Progress Notes (Signed)
 PHARMACY - ANTICOAGULATION CONSULT NOTE  Pharmacy Consult for heparin  Indication: atrial fibrillation  Allergies  Allergen Reactions   Flecainide  Other (See Comments)    Rate related aberrancy versus slow VT, asymptomatic    Patient Measurements: Height: 5' 2 (157.5 cm) Weight: 47.7 kg (105 lb 2.6 oz) IBW/kg (Calculated) : 50.1 HEPARIN  DW (KG): 47.7  Vital Signs: Temp: 98.4 F (36.9 C) (09/11 1234) Temp Source: Axillary (09/11 1234) BP: 123/56 (09/11 1234) Pulse Rate: 76 (09/11 1234)  Labs: Recent Labs    01/18/24 0258 01/18/24 1158 01/19/24 0443 01/20/24 0541  HGB 9.7*  --  8.3* 9.3*  HCT 28.5*  --  25.5* 28.6*  PLT 234  --  223 224  CREATININE 1.02*  --  1.07* 1.10*  TROPONINIHS  --  84*  --   --     Estimated Creatinine Clearance: 30.7 mL/min (A) (by C-G formula based on SCr of 1.1 mg/dL (H)).   Medical History: Past Medical History:  Diagnosis Date   Adenocarcinoma, colon (HCC) 03/2005   Stage I; resected in 03/2005   Arthritis    Colon cancer [facility])    Removed surgicaly    Dysrhythmia    Gastroesophageal reflux disease    Gastroparesis    Hearing loss    mild   Hyperlipidemia    Irregular heartbeat    Ovarian cyst 2008   Pain in joint of right shoulder 06/29/2017   Pain in joint, ankle and foot 03/13/2014   Paroxysmal atrial fibrillation (HCC)    Palpitations; maintained on flecainide ; -normal coronary angiography and 1999; negative stress nuclear study in 11/2005   PONV (postoperative nausea and vomiting)    Tobacco abuse, in remission    10-pack-years; quit in 2001    Assessment: 83 year old female with known afib on apixaban . Last dose was this morning per [facility]. Will transition to IV heparin  tonight with plans to transfer patient to [facility] for possible heart cath on 9/12. Hemoglobin stable at 9.3, platelet within normal limits. Currently no bleeding issues noted.   Will likely need to follow aptt as heparin  levels likely to be elevated due to  recent apixaban  use.    Goal of Therapy:  Aptt goal 66-102s Heparin  level 0.3-0.7 units/ml Monitor platelets by anticoagulation protocol: Yes   Plan:  Start heparin  at 600 units/hr this evening Check heparin  level 8 hours after start Follow up plan regarding transfer and cath  Dempsey Blush PharmD., BCPS Clinical Pharmacist 01/20/2024 2:19 PM

## 2024-01-20 NOTE — Progress Notes (Signed)
 Progress Note   Patient: [name] FMW:[id number] DOB: [birth year] DOA: 01/15/2024     0 DOS: the patient was seen and examined on 01/20/2024   Brief hospital course: [name] is a 83 y.o. female with medical history significant of PAF on anticoagulation and flecainide , ostoporosis, hyperlipidemia, chronic pain on opiates, long hystory of orthostasis and has been on midodrine  intermittently over the last year presented with fall.  She is admitted to hospitalist service for evaluation of fall due to orthostasis.  Assessment and Plan: Orthostasis S/p Fall, head trauma- -Long h/o orthostasis, complicated by dehydration. -Continue the use of midodrine  3 times a day - 2D echo demonstrating preserved ejection fraction, no wall motion normalities and no significant valvular disorder. - Continue as needed analgesics.  Ventricular tachycardia- -Continue telemetry monitoring - Follow electrolytes and replete as needed (goal is for potassium above 4 and magnesium  above 2). - Given ongoing changes and intermittent no sustained episodes of ventricular tachycardia decision has been made to proceed with cardiac cath for further evaluation. - Patient will be transferred to [facility] for anticipated cardiac cath on 01/21/2024. - No chest pain.  Paroxysmal atrial fibrillation -Planning to continue Eliquis  for secondary prevention at discharge. -Transition inpatient to heparin  drip with anticipated cath on 01/21/2024. - Following cardiology recommendation Flecainide  has been discontinued - Planning for event monitoring after discharge.  Hyponatremia In the setting of dehydration - Patient advised to maintain adequate oral intake - Continue to follow electrolytes.  Leukocytosis Reactive/stress demargination - No signs of acute infection currently appreciated.  Debility- Generalized weakness- -Due to poor oral intake. -Lives alone, appreciate assistance, evaluation and  recommendation by PT/OT.  Planning for home health services at discharge. -Patient has been encouraged to continue to participate with mobility specialist for out of bed activities and to sit couple hours at a time on the chair. -Continue the use of incentive spirometry. -Continue supportive care.  Diet:  Diet Orders (From admission, onward)     Start     Ordered   01/21/24 0001  Diet NPO time specified Except for: Sips with Meds  Diet effective midnight       Question:  Except for  Answer:  Sips with Meds   01/20/24 1317   01/15/24 2213  Diet Heart Room service appropriate? Yes; Fluid consistency: Thin  Diet effective now       Question Answer Comment  Room service appropriate? Yes   Fluid consistency: Thin      01/15/24 2212           DVT prophylaxis:   Level of care: Telemetry Cardiac   Code Status: Full Code  Subjective: No chest pain, no nausea, no vomiting.  Reports intermittent skipping beat sensation and palpitations.  Physical Exam: Vitals:   01/19/24 2140 01/20/24 0329 01/20/24 1150 01/20/24 1234  BP: (P) 134/70 (!) 152/67 126/72 (!) 123/56  Pulse: (!) (P) 101 84 89 76  Resp: (P) 20 18 18    Temp: (P) 99.7 F (37.6 C) 97.7 F (36.5 C)  98.4 F (36.9 C)  TempSrc: (P) Oral Oral  Axillary  SpO2: (P) 95% 93% 98% 93%  Weight:      Height:       General exam: Alert, awake, oriented x 3; in no major distress.  Patient has experienced episodes of nonsustained runs of V. tach along with episodes of atrial fibrillation.  No chest pain. Respiratory system: Clear to auscultation. Respiratory effort normal. Cardiovascular system: No rubs or  gallops.  At time of evaluation back to sinus rhythm. Gastrointestinal system: Abdomen is nondistended, soft and nontender. No organomegaly or masses felt. Normal bowel sounds heard. Central nervous system: No focal neurological deficits. Extremities: No cyanosis or clubbing. Skin: No petechiae.  Facial bruises appreciated on  exam. Psychiatry: Judgement and insight appear normal. Mood & affect appropriate.   Data Reviewed:      Latest Ref Rng & Units 01/20/2024    5:41 AM 01/19/2024    4:43 AM 01/18/2024    2:58 AM  CBC  WBC 4.0 - 10.5 K/uL 14.4  14.0  11.4   Hemoglobin 12.0 - 15.0 g/dL 9.3  8.3  9.7   Hematocrit 36.0 - 46.0 % 28.6  25.5  28.5   Platelets 150 - 400 K/uL 224  223  234       Latest Ref Rng & Units 01/20/2024    5:41 AM 01/19/2024    4:43 AM 01/18/2024    2:58 AM  BMP  Glucose 70 - 99 mg/dL 868  877  873   BUN 8 - 23 mg/dL 22  22  22    Creatinine 0.44 - 1.00 mg/dL 8.89  8.92  8.97   Sodium 135 - 145 mmol/L 130  131  132   Potassium 3.5 - 5.1 mmol/L 4.4  4.3  3.8   Chloride 98 - 111 mmol/L 96  101  101   CO2 22 - 32 mmol/L 23  22  22    Calcium  8.9 - 10.3 mg/dL 7.9  8.0  8.7    ECHOCARDIOGRAM COMPLETE Result Date: 01/19/2024    ECHOCARDIOGRAM REPORT   Patient Name:   [name] Date of Exam: 01/19/2024 Medical Rec #:  [id number]     Height:       62.0 in Accession #:    [id number]    Weight:       105.2 lb Date of Birth:  1940-07-01    BSA:          1.455 m Patient Age:    82 years      BP:           140/57 mmHg Patient Gender: F             HR:           80 bpm. Exam Location:  [name] Procedure: 2D Echo, Cardiac Doppler and Color Doppler (Both Spectral and Color            Flow Doppler were utilized during procedure). Indications:    Ventricular Tachycardia l47.2  History:        Patient has prior history of Echocardiogram examinations, most                 recent 09/24/2022. Arrythmias:Atrial Fibrillation; Risk                 Factors:Dyslipidemia, Former Smoker and Pre-diabetes. Hx of                 COVID-19.  Sonographer:    [name] RCS Referring Phys: [id number] [name] IMPRESSIONS  1. Left ventricular ejection fraction, by estimation, is 60 to 65%. The left ventricle has normal function. The left ventricle has no regional wall motion abnormalities. Left ventricular diastolic  parameters were normal.  2. Right ventricular systolic function is normal. The right ventricular size is normal. There is normal pulmonary artery systolic pressure. The estimated right ventricular systolic pressure is 33.2 mmHg.  3. A small pericardial effusion is present. The pericardial effusion is posterior to the left ventricle.  4. The mitral valve is grossly normal. Mild mitral valve regurgitation.  5. Tricuspid valve regurgitation is moderate.  6. The aortic valve has an indeterminant number of cusps, cannot exclude functionally bicuspid valve. Aortic valve regurgitation is not visualized. No aortic stenosis is present. Aortic valve mean gradient measures 7.0 mmHg. Calification noted within LVOT and annulus.  7. The inferior vena cava is normal in size with greater than 50% respiratory variability, suggesting right atrial pressure of 3 mmHg. Comparison(s): Prior images reviewed side by side. LVEF 60-65%. Possible functionally bicuspid aortic valve without stenosis. FINDINGS  Left Ventricle: Left ventricular ejection fraction, by estimation, is 60 to 65%. The left ventricle has normal function. The left ventricle has no regional wall motion abnormalities. The left ventricular internal cavity size was normal in size. There is  borderline left ventricular hypertrophy. Left ventricular diastolic parameters were normal. Right Ventricle: The right ventricular size is normal. No increase in right ventricular wall thickness. Right ventricular systolic function is normal. There is normal pulmonary artery systolic pressure. The tricuspid regurgitant velocity is 2.75 m/s, and  with an assumed right atrial pressure of 3 mmHg, the estimated right ventricular systolic pressure is 33.2 mmHg. Left Atrium: Left atrial size was normal in size. Right Atrium: Right atrial size was normal in size. Pericardium: A small pericardial effusion is present. The pericardial effusion is posterior to the left ventricle. Mitral Valve: The  mitral valve is grossly normal. Mild mitral valve regurgitation. Tricuspid Valve: The tricuspid valve is grossly normal. Tricuspid valve regurgitation is moderate. Aortic Valve: The aortic valve has an indeterminant number of cusps. Aortic valve regurgitation is not visualized. No aortic stenosis is present. Aortic valve mean gradient measures 7.0 mmHg. Aortic valve peak gradient measures 14.3 mmHg. Aortic valve area, by VTI measures 1.20 cm. Pulmonic Valve: The pulmonic valve was grossly normal. Pulmonic valve regurgitation is trivial. Aorta: The aortic root is normal in size and structure. Venous: The inferior vena cava is normal in size with greater than 50% respiratory variability, suggesting right atrial pressure of 3 mmHg. IAS/Shunts: No atrial level shunt detected by color flow Doppler. Additional Comments: 3D was performed not requiring image post processing on an independent workstation and was indeterminate.  LEFT VENTRICLE PLAX 2D LVIDd:         4.20 cm   Diastology LVIDs:         2.40 cm   LV e' medial:    12.10 cm/s LV PW:         1.00 cm   LV E/e' medial:  9.0 LV IVS:        0.90 cm   LV e' lateral:   14.20 cm/s LVOT diam:     1.60 cm   LV E/e' lateral: 7.7 LV SV:         53 LV SV Index:   37 LVOT Area:     2.01 cm  RIGHT VENTRICLE RV S prime:     14.00 cm/s TAPSE (M-mode): 2.8 cm LEFT ATRIUM             Index        RIGHT ATRIUM           Index LA diam:        2.90 cm 1.99 cm/m   RA Area:     12.60 cm LA Vol (A2C):   20.8 ml 14.30 ml/m  RA  Volume:   25.70 ml  17.67 ml/m LA Vol (A4C):   32.2 ml 22.14 ml/m LA Biplane Vol: 26.1 ml 17.94 ml/m  AORTIC VALVE AV Area (Vmax):    1.16 cm AV Area (Vmean):   1.20 cm AV Area (VTI):     1.20 cm AV Vmax:           189.00 cm/s AV Vmean:          125.000 cm/s AV VTI:            0.444 m AV Peak Grad:      14.3 mmHg AV Mean Grad:      7.0 mmHg LVOT Vmax:         109.00 cm/s LVOT Vmean:        74.900 cm/s LVOT VTI:          0.265 m LVOT/AV VTI ratio: 0.60   AORTA Ao Root diam: 2.80 cm MITRAL VALVE                TRICUSPID VALVE MV Area (PHT): 3.37 cm     TR Peak grad:   30.2 mmHg MV Decel Time: 225 msec     TR Vmax:        275.00 cm/s MV E velocity: 109.00 cm/s MV A velocity: 88.00 cm/s   SHUNTS MV E/A ratio:  1.24         Systemic VTI:  0.26 m                             Systemic Diam: 1.60 cm Jayson Sierras MD Electronically signed by Jayson Sierras MD Signature Date/Time: 01/19/2024/3:00:38 PM    Final    DG HIP UNILAT WITH PELVIS 2-3 VIEWS LEFT Result Date: 01/18/2024 CLINICAL DATA:  Fall, hip pain EXAM: DG HIP (WITH OR WITHOUT PELVIS) 2-3V LEFT COMPARISON:  06/03/2021 FINDINGS: Bony demineralization. Mild spurring of the left femoral head and acetabulum. No definite cortical discontinuity to indicate fracture of the left proximal femur or adjacent bony pelvis. IMPRESSION: 1. No fracture identified. If the patient is unable to bear weight or if there is a high clinical index of suspicion of occult fracture, cross-sectional imaging may be warranted for further characterization. 2. Mild degenerative spurring of the left femoral head and acetabulum. 3. Bony demineralization. Electronically Signed   By: Ryan Salvage M.D.   On: 01/18/2024 18:06    Family Communication: Discussed with patient, she understand and agree. All questions answered. No family present during today's evaluation.  Disposition: Status is: Observation The patient remains OBS appropriate and will d/c before 2 midnights.  Patient will be started on heparin  drip in anticipation for cardiac cath on 01/21/2024.  Orders placed to transfer to [facility] telemetry bed for procedure to take place.   Planned Discharge Destination: Home with Home Health   Time spent: 35 minutes  Author: Eric Nunnery, MD 01/20/2024 1:25 PM Secure chat 7am to 7pm For on call review [email or web address].

## 2024-01-20 NOTE — Progress Notes (Signed)
 Mobility Specialist Progress Note:    01/20/24 1155  Mobility  Activity Pivoted/transferred from bed to chair  Level of Assistance Minimal assist, patient does 75% or more  Assistive Device Front wheel walker  Distance Ambulated (ft) 3 ft  Range of Motion/Exercises Active;All extremities  Activity Response Tolerated well  Mobility Referral Yes  Mobility visit 1 Mobility  Mobility Specialist Start Time (ACUTE ONLY) 1155  Mobility Specialist Stop Time (ACUTE ONLY) 1215  Mobility Specialist Time Calculation (min) (ACUTE ONLY) 20 min   Pt received in bed, agreeable to sit up in chair for lunch. Required MinA to stand and transfer with RW. Tolerated well, c/o dizziness while transferring. Family and RN in room, all needs met.  Shirleen Mcfaul Mobility Specialist Please contact via Special educational needs teacher or  Rehab office at [phone]

## 2024-01-20 NOTE — Progress Notes (Signed)
 Report called to [place], RN 6East, awaiting carelink to call , will notify and update son

## 2024-01-20 NOTE — Progress Notes (Signed)
 [facility] notified of plan to transfer patient on IM service for [facility] tomorrow afternoon. Eliquis  held. Heparin  per pharmacy ordered. Pre cath orders written. MD consented patient.

## 2024-01-20 NOTE — Progress Notes (Signed)
 Patient is alert and oriented 4, with no signs or symptoms of acute distress. Vital signs are stable. Patient verbalized understanding of the reason for transfer to [facility]. Patient's son was contacted and provided with an update regarding the transfer and estimated time of arrival.

## 2024-01-20 NOTE — TOC Transition Note (Signed)
 Transition of Care [facility]) - Discharge Note   Patient Details  Name: [name] MRN: [id number] Date of Birth: 03/05/1941  Transition of Care [facility]) CM/SW Contact:  Lucie Lunger, LCSWA Phone Number: 01/20/2024, 9:59 AM   Clinical Narrative:    CSW updated that pt will D/C home today. CSW updated Lauraine with [employer] who states that they will follow pt in community. CSW requested that MD place [facility] PT/OT orders. HH agency info added to AVS for pt to review at D/C. TOC signing off.   Final next level of care: Home w Home Health Services Barriers to Discharge: Barriers Resolved   Patient Goals and CMS Choice Patient states their goals for this hospitalization and ongoing recovery are:: return home CMS Medicare.gov Compare Post Acute Care list provided to:: Patient Choice offered to / list presented to : Patient [place] ownership interest in [facility].provided to::  (NA)    Discharge Placement                       Discharge Plan and Services Additional resources added to the After Visit Summary for   In-house Referral: Clinical Social Work Discharge Planning Services: NA Post Acute Care Choice: Home Health          DME Arranged: N/A DME Agency: NA       HH Arranged: PT, OT HH Agency: [facility] Date HH Agency Contacted: 01/20/24 Time HH Agency Contacted: 1457 Representative spoke with at [facility] Agency: Lauraine  Social Drivers of Health (SDOH) Interventions SDOH Screenings   Food Insecurity: No Food Insecurity (01/15/2024)  Housing: Low Risk  (01/15/2024)  Transportation Needs: No Transportation Needs (01/15/2024)  Utilities: Not At Risk (01/15/2024)  Alcohol Screen: Low Risk  (09/18/2022)  Depression (PHQ2-9): Low Risk  (12/13/2023)  Financial Resource Strain: Low Risk  (09/18/2022)  Physical Activity: Sufficiently Active (09/18/2022)  Social Connections: Moderately Isolated (01/15/2024)  Stress: No Stress Concern Present (09/18/2022)  Tobacco Use:  Medium Risk (01/16/2024)     Readmission Risk Interventions     No data to display

## 2024-01-20 NOTE — Progress Notes (Signed)
 MD Mallipeddi here, reviewed tele strips and now in to see pt at bedside. Pt with no further arrhythmias noted and no new complaints.

## 2024-01-20 NOTE — Plan of Care (Signed)

## 2024-01-20 NOTE — Progress Notes (Signed)
   01/20/24 1150  Vitals  BP 126/72  MAP (mmHg) 80  BP Location Left Arm  BP Method Automatic  Patient Position (if appropriate) Lying  Pulse Rate 89  Pulse Rate Source Apical  Resp 18  Level of Consciousness  Level of Consciousness Alert  Oxygen Therapy  SpO2 98 %  O2 Device Room Air   1136 - notified by central tele that pt in afib 140's with couplets, progressed to 3-7 beat runs VT then sustained VT (34 beat run then 14 beat run). On assessment, pt states has been feeling fluttering in her chest with nausea and SOB before my arrival to room.  12 lead EKG completed showing NSR 80's. Other VSS. Pt states no further fluttering in chest and nausea/SOB resolved. MD Ricky notified face to face and per his request, MD Mallenpeddi also notified of event and pt condition. Pt and family member both updated on MD notification and advised to call staff immediately if she has any recurring palpitations/SOB/nausea. Both state understanding.

## 2024-01-20 NOTE — Progress Notes (Addendum)
 Progress Note  Patient Name: [name] Date of Encounter: 01/20/2024  Primary Cardiologist: Alvan Carrier, MD  Subjective   She complains of pain all over her body and her back.  She got stronger pain medications here but did not improve the pain according to her.  Echo performed yesterday was unremarkable.  Inpatient Medications    Scheduled Meds:  acetaminophen   1,000 mg Oral TID   apixaban   2.5 mg Oral BID   feeding supplement  237 mL Oral BID BM   lactulose   20 g Oral QHS   midodrine   5 mg Oral TID WC   pantoprazole   40 mg Oral Daily   rosuvastatin   40 mg Oral Daily   Continuous Infusions:  sodium chloride      PRN Meds: methocarbamol , oxyCODONE , prochlorperazine    Vital Signs    Vitals:   01/19/24 1208 01/19/24 1444 01/19/24 2140 01/20/24 0329  BP: 119/60 (!) 114/58 (P) 134/70 (!) 152/67  Pulse: 76 73 (!) (P) 101 84  Resp: 20 19 (P) 20 18  Temp: 98.2 F (36.8 C) 97.6 F (36.4 C) (P) 99.7 F (37.6 C) 97.7 F (36.5 C)  TempSrc: Oral Oral (P) Oral Oral  SpO2: 93% 99% (P) 95% 93%  Weight:      Height:        Intake/Output Summary (Last 24 hours) at 01/20/2024 1041 Last data filed at 01/20/2024 0918 Gross per 24 hour  Intake 720 ml  Output 700 ml  Net 20 ml   Filed Weights   01/15/24 2210  Weight: 47.7 kg    Telemetry     Personally reviewed.  NSR, HR 70s.  ECG   Not performed today.  Physical Exam   GEN: No acute distress.   Neck: No JVD. Cardiac: RRR, no murmur, rub, or gallop.  Respiratory: Nonlabored. Clear to auscultation bilaterally. GI: Soft, nontender, bowel sounds present. MS: No edema; No deformity. Neuro:  Nonfocal. Psych: Alert and oriented x 3. Normal affect.  Labs    Chemistry Recent Labs  Lab 01/15/24 1604 01/16/24 0417 01/18/24 0258 01/19/24 0443 01/20/24 0541  NA 129*   < > 132* 131* 130*  K 4.1   < > 3.8 4.3 4.4  CL 96*   < > 101 101 96*  CO2 22   < > 22 22 23   GLUCOSE 118*   < > 126* 122* 131*  BUN  14   < > 22 22 22   CREATININE 0.80   < > 1.02* 1.07* 1.10*  CALCIUM  9.3   < > 8.7* 8.0* 7.9*  PROT 6.6  --   --   --   --   ALBUMIN 3.9  --   --   --   --   AST 18  --   --   --   --   ALT 12  --   --   --   --   ALKPHOS 43  --   --   --   --   BILITOT 0.7  --   --   --   --   GFRNONAA >60   < > 56* 53* 51*  ANIONGAP 11   < > 9 8 11    < > = values in this interval not displayed.     Hematology Recent Labs  Lab 01/18/24 0258 01/19/24 0443 01/20/24 0541  WBC 11.4* 14.0* 14.4*  RBC 2.85* 2.50* 2.76*  HGB 9.7* 8.3* 9.3*  HCT 28.5* 25.5* 28.6*  MCV  100.0 102.0* 103.6*  MCH 34.0 33.2 33.7  MCHC 34.0 32.5 32.5  RDW 11.4* 11.9 11.7  PLT 234 223 224    Cardiac Enzymes Recent Labs  Lab 01/18/24 1158  TROPONINIHS 84*    BNPNo results for input(s): BNP, PROBNP in the last 168 hours.   DDimerNo results for input(s): DDIMER in the last 168 hours.    Assessment & Plan    Wide-complex tachycardia Multiple syncopal events - On 01/18/2024 early a.m, between 12 am and 3 am (pictures captured and uploaded to the media) - Patient was active, walking to the bathroom at that time.  Asymptomatic. - Discontinued flecainide  on 01/18/2024 after which she did not have any more arrhythmias. - Obtain 2-week event monitor, will be mailed upon discharge. - If event monitor is unremarkable, she will benefit from loop recorder placement.  Orthostatic hypotension - Continue midodrine  5 mg 3 times daily.  Addendum @ 1pm on 01/20/2024 Patient had WCT likely rate related aberrancy versus slow VT on 01/18/24 (Pics on media), patient was asymptomatic. Flecainide  discontinued. Quiet for 2 days until this afternoon she had brief Afib with RVR followed by multiple runs of NSVT (Pics on media) where she was reportedly symptomatic (palpitations and dizziness). Due to recurrent dizziness/syncope (fell face down) and spontaneous WCT concerning for NSVT/VT (despite normal electrolytes), she will benefit  from [facility] to r/o LAD disease. On Eliquis , discontinued. Last dose this a.m. Start metoprolol  tartarate 25 mg BID. Keep NPO after midnight for [facility] tomorrow afternoon (01/21/2024 afternoon).  Informed consent for [facility] Risks and benefits of cardiac catheterization have been discussed with the patient.  These include bleeding, infection, kidney damage, stroke, heart attack, death.  The patient understands these risks and is willing to proceed.  Signed, Diannah SHAUNNA Maywood, MD  01/20/2024, 10:41 AM

## 2024-01-20 NOTE — Progress Notes (Signed)
 Sent request re: 2 week live monitor to office staff to help mail to patient. 1 month f/u has been scheduled already. Outlined info on AVS. Per Dr. Stacia, if monitor unremarkable would then consider referral to EP to consider ILR.

## 2024-01-20 NOTE — Telephone Encounter (Signed)
 Dr. Mallipeddi request a 14 day live Zio monitor for syncope.

## 2024-01-21 ENCOUNTER — Observation Stay (HOSPITAL_COMMUNITY)

## 2024-01-21 ENCOUNTER — Encounter (HOSPITAL_COMMUNITY)
Admission: EM | Disposition: A | Discharge status: Skilled Nursing Facility | Payer: Self-pay | Source: Home / Self Care | Attending: Family Medicine

## 2024-01-21 DIAGNOSIS — I471 Supraventricular tachycardia, unspecified: Secondary | ICD-10-CM | POA: Diagnosis not present

## 2024-01-21 DIAGNOSIS — I951 Orthostatic hypotension: Secondary | ICD-10-CM | POA: Diagnosis not present

## 2024-01-21 DIAGNOSIS — Z7189 Other specified counseling: Secondary | ICD-10-CM | POA: Diagnosis not present

## 2024-01-21 DIAGNOSIS — M1990 Unspecified osteoarthritis, unspecified site: Secondary | ICD-10-CM | POA: Diagnosis not present

## 2024-01-21 DIAGNOSIS — E785 Hyperlipidemia, unspecified: Secondary | ICD-10-CM | POA: Diagnosis not present

## 2024-01-21 DIAGNOSIS — R296 Repeated falls: Secondary | ICD-10-CM | POA: Diagnosis not present

## 2024-01-21 DIAGNOSIS — I472 Ventricular tachycardia, unspecified: Secondary | ICD-10-CM

## 2024-01-21 DIAGNOSIS — I5031 Acute diastolic (congestive) heart failure: Secondary | ICD-10-CM | POA: Diagnosis not present

## 2024-01-21 DIAGNOSIS — M810 Age-related osteoporosis without current pathological fracture: Secondary | ICD-10-CM | POA: Diagnosis not present

## 2024-01-21 DIAGNOSIS — E871 Hypo-osmolality and hyponatremia: Secondary | ICD-10-CM | POA: Diagnosis not present

## 2024-01-21 DIAGNOSIS — M5442 Lumbago with sciatica, left side: Secondary | ICD-10-CM | POA: Diagnosis not present

## 2024-01-21 DIAGNOSIS — M6281 Muscle weakness (generalized): Secondary | ICD-10-CM | POA: Diagnosis not present

## 2024-01-21 DIAGNOSIS — I509 Heart failure, unspecified: Secondary | ICD-10-CM | POA: Diagnosis not present

## 2024-01-21 DIAGNOSIS — E860 Dehydration: Secondary | ICD-10-CM | POA: Diagnosis not present

## 2024-01-21 DIAGNOSIS — E861 Hypovolemia: Secondary | ICD-10-CM | POA: Diagnosis not present

## 2024-01-21 DIAGNOSIS — Z743 Need for continuous supervision: Secondary | ICD-10-CM | POA: Diagnosis not present

## 2024-01-21 DIAGNOSIS — I4510 Unspecified right bundle-branch block: Secondary | ICD-10-CM | POA: Diagnosis present

## 2024-01-21 DIAGNOSIS — R0989 Other specified symptoms and signs involving the circulatory and respiratory systems: Secondary | ICD-10-CM | POA: Diagnosis not present

## 2024-01-21 DIAGNOSIS — S8002XA Contusion of left knee, initial encounter: Secondary | ICD-10-CM | POA: Diagnosis not present

## 2024-01-21 DIAGNOSIS — I700 Atherosclerosis of aorta: Secondary | ICD-10-CM | POA: Diagnosis not present

## 2024-01-21 DIAGNOSIS — I5033 Acute on chronic diastolic (congestive) heart failure: Secondary | ICD-10-CM | POA: Diagnosis not present

## 2024-01-21 DIAGNOSIS — Z9889 Other specified postprocedural states: Secondary | ICD-10-CM | POA: Diagnosis not present

## 2024-01-21 DIAGNOSIS — N1831 Chronic kidney disease, stage 3a: Secondary | ICD-10-CM | POA: Diagnosis not present

## 2024-01-21 DIAGNOSIS — R55 Syncope and collapse: Secondary | ICD-10-CM | POA: Diagnosis present

## 2024-01-21 DIAGNOSIS — E876 Hypokalemia: Secondary | ICD-10-CM | POA: Diagnosis not present

## 2024-01-21 DIAGNOSIS — R000 Tachycardia, unspecified: Secondary | ICD-10-CM | POA: Diagnosis not present

## 2024-01-21 DIAGNOSIS — Z7901 Long term (current) use of anticoagulants: Secondary | ICD-10-CM | POA: Diagnosis not present

## 2024-01-21 DIAGNOSIS — N179 Acute kidney failure, unspecified: Secondary | ICD-10-CM | POA: Diagnosis not present

## 2024-01-21 DIAGNOSIS — G8929 Other chronic pain: Secondary | ICD-10-CM | POA: Diagnosis not present

## 2024-01-21 DIAGNOSIS — R6889 Other general symptoms and signs: Secondary | ICD-10-CM | POA: Diagnosis not present

## 2024-01-21 DIAGNOSIS — M5441 Lumbago with sciatica, right side: Secondary | ICD-10-CM | POA: Diagnosis not present

## 2024-01-21 DIAGNOSIS — S0990XD Unspecified injury of head, subsequent encounter: Secondary | ICD-10-CM | POA: Diagnosis not present

## 2024-01-21 DIAGNOSIS — D649 Anemia, unspecified: Secondary | ICD-10-CM | POA: Diagnosis not present

## 2024-01-21 DIAGNOSIS — M7981 Nontraumatic hematoma of soft tissue: Secondary | ICD-10-CM | POA: Diagnosis not present

## 2024-01-21 DIAGNOSIS — Z515 Encounter for palliative care: Secondary | ICD-10-CM | POA: Diagnosis not present

## 2024-01-21 DIAGNOSIS — K219 Gastro-esophageal reflux disease without esophagitis: Secondary | ICD-10-CM | POA: Diagnosis not present

## 2024-01-21 DIAGNOSIS — I480 Paroxysmal atrial fibrillation: Secondary | ICD-10-CM | POA: Diagnosis not present

## 2024-01-21 DIAGNOSIS — R531 Weakness: Secondary | ICD-10-CM | POA: Diagnosis not present

## 2024-01-21 DIAGNOSIS — M5459 Other low back pain: Secondary | ICD-10-CM | POA: Diagnosis not present

## 2024-01-21 DIAGNOSIS — J90 Pleural effusion, not elsewhere classified: Secondary | ICD-10-CM | POA: Diagnosis not present

## 2024-01-21 DIAGNOSIS — R54 Age-related physical debility: Secondary | ICD-10-CM | POA: Diagnosis not present

## 2024-01-21 DIAGNOSIS — J690 Pneumonitis due to inhalation of food and vomit: Secondary | ICD-10-CM | POA: Diagnosis not present

## 2024-01-21 DIAGNOSIS — K3184 Gastroparesis: Secondary | ICD-10-CM | POA: Diagnosis not present

## 2024-01-21 DIAGNOSIS — Z7401 Bed confinement status: Secondary | ICD-10-CM | POA: Diagnosis not present

## 2024-01-21 DIAGNOSIS — J449 Chronic obstructive pulmonary disease, unspecified: Secondary | ICD-10-CM | POA: Diagnosis not present

## 2024-01-21 DIAGNOSIS — S01112A Laceration without foreign body of left eyelid and periocular area, initial encounter: Secondary | ICD-10-CM | POA: Diagnosis not present

## 2024-01-21 DIAGNOSIS — I130 Hypertensive heart and chronic kidney disease with heart failure and stage 1 through stage 4 chronic kidney disease, or unspecified chronic kidney disease: Secondary | ICD-10-CM | POA: Diagnosis not present

## 2024-01-21 DIAGNOSIS — R918 Other nonspecific abnormal finding of lung field: Secondary | ICD-10-CM | POA: Diagnosis not present

## 2024-01-21 DIAGNOSIS — J811 Chronic pulmonary edema: Secondary | ICD-10-CM | POA: Diagnosis not present

## 2024-01-21 DIAGNOSIS — D72829 Elevated white blood cell count, unspecified: Secondary | ICD-10-CM | POA: Diagnosis not present

## 2024-01-21 HISTORY — PX: LEFT HEART CATH AND CORONARY ANGIOGRAPHY: CATH118249

## 2024-01-21 LAB — CBC
HCT: 28.8 % — ABNORMAL LOW (ref 36.0–46.0)
HCT: 25.3 % — ABNORMAL LOW (ref 36.0–46.0)
HCT: 27.2 % — ABNORMAL LOW (ref 36.0–46.0)
Hemoglobin: 9.5 g/dL — ABNORMAL LOW (ref 12.0–15.0)
Hemoglobin: 8.4 g/dL — ABNORMAL LOW (ref 12.0–15.0)
Hemoglobin: 9.2 g/dL — ABNORMAL LOW (ref 12.0–15.0)
MCH: 33.5 pg (ref 26.0–34.0)
MCH: 33.1 pg (ref 26.0–34.0)
MCH: 33.9 pg (ref 26.0–34.0)
MCHC: 33 g/dL (ref 30.0–36.0)
MCHC: 33.2 g/dL (ref 30.0–36.0)
MCHC: 33.8 g/dL (ref 30.0–36.0)
MCV: 101.4 fL — ABNORMAL HIGH (ref 80.0–100.0)
MCV: 99.6 fL (ref 80.0–100.0)
MCV: 100.4 fL — ABNORMAL HIGH (ref 80.0–100.0)
Platelets: 250 K/uL (ref 150–400)
Platelets: 221 K/uL (ref 150–400)
Platelets: 229 K/uL (ref 150–400)
RBC: 2.84 MIL/uL — ABNORMAL LOW (ref 3.87–5.11)
RBC: 2.54 MIL/uL — ABNORMAL LOW (ref 3.87–5.11)
RBC: 2.71 MIL/uL — ABNORMAL LOW (ref 3.87–5.11)
RDW: 11.8 % (ref 11.5–15.5)
RDW: 11.7 % (ref 11.5–15.5)
RDW: 11.9 % (ref 11.5–15.5)
WBC: 17.1 K/uL — ABNORMAL HIGH (ref 4.0–10.5)
WBC: 16.6 K/uL — ABNORMAL HIGH (ref 4.0–10.5)
WBC: 19.7 K/uL — ABNORMAL HIGH (ref 4.0–10.5)
nRBC: 0 % (ref 0.0–0.2)
nRBC: 0 % (ref 0.0–0.2)
nRBC: 0 % (ref 0.0–0.2)

## 2024-01-21 LAB — MAGNESIUM: Magnesium: 1.9 mg/dL (ref 1.7–2.4)

## 2024-01-21 LAB — POCT I-STAT 7, (LYTES, BLD GAS, ICA,H+H)
Acid-base deficit: 3 mmol/L — ABNORMAL HIGH (ref 0.0–2.0)
Acid-base deficit: 4 mmol/L — ABNORMAL HIGH (ref 0.0–2.0)
Bicarbonate: 17.9 mmol/L — ABNORMAL LOW (ref 20.0–28.0)
Bicarbonate: 18.5 mmol/L — ABNORMAL LOW (ref 20.0–28.0)
Calcium, Ion: 1.01 mmol/L — ABNORMAL LOW (ref 1.15–1.40)
Calcium, Ion: 1.03 mmol/L — ABNORMAL LOW (ref 1.15–1.40)
HCT: 25 % — ABNORMAL LOW (ref 36.0–46.0)
HCT: 26 % — ABNORMAL LOW (ref 36.0–46.0)
Hemoglobin: 8.5 g/dL — ABNORMAL LOW (ref 12.0–15.0)
Hemoglobin: 8.8 g/dL — ABNORMAL LOW (ref 12.0–15.0)
O2 Saturation: 99 %
O2 Saturation: 99 %
Potassium: 4.4 mmol/L (ref 3.5–5.1)
Potassium: 4.7 mmol/L (ref 3.5–5.1)
Sodium: 126 mmol/L — ABNORMAL LOW (ref 135–145)
Sodium: 127 mmol/L — ABNORMAL LOW (ref 135–145)
TCO2: 18 mmol/L — ABNORMAL LOW (ref 22–32)
TCO2: 19 mmol/L — ABNORMAL LOW (ref 22–32)
pCO2 arterial: 19.9 mmHg — CL (ref 32–48)
pCO2 arterial: 23.5 mmHg — ABNORMAL LOW (ref 32–48)
pH, Arterial: 7.503 — ABNORMAL HIGH (ref 7.35–7.45)
pH, Arterial: 7.561 — ABNORMAL HIGH (ref 7.35–7.45)
pO2, Arterial: 122 mmHg — ABNORMAL HIGH (ref 83–108)
pO2, Arterial: 123 mmHg — ABNORMAL HIGH (ref 83–108)

## 2024-01-21 LAB — BASIC METABOLIC PANEL WITH GFR
Anion gap: 15 (ref 5–15)
Anion gap: 10 (ref 5–15)
BUN: 29 mg/dL — ABNORMAL HIGH (ref 8–23)
BUN: 29 mg/dL — ABNORMAL HIGH (ref 8–23)
CO2: 21 mmol/L — ABNORMAL LOW (ref 22–32)
CO2: 21 mmol/L — ABNORMAL LOW (ref 22–32)
Calcium: 8.1 mg/dL — ABNORMAL LOW (ref 8.9–10.3)
Calcium: 7.7 mg/dL — ABNORMAL LOW (ref 8.9–10.3)
Chloride: 94 mmol/L — ABNORMAL LOW (ref 98–111)
Chloride: 96 mmol/L — ABNORMAL LOW (ref 98–111)
Creatinine, Ser: 1.66 mg/dL — ABNORMAL HIGH (ref 0.44–1.00)
Creatinine, Ser: 1.59 mg/dL — ABNORMAL HIGH (ref 0.44–1.00)
GFR, Estimated: 31 mL/min — ABNORMAL LOW (ref >60)
GFR, Estimated: 33 mL/min — ABNORMAL LOW (ref >60)
Glucose, Bld: 113 mg/dL — ABNORMAL HIGH (ref 70–99)
Glucose, Bld: 111 mg/dL — ABNORMAL HIGH (ref 70–99)
Potassium: 4.6 mmol/L (ref 3.5–5.1)
Potassium: 4.3 mmol/L (ref 3.5–5.1)
Sodium: 130 mmol/L — ABNORMAL LOW (ref 135–145)
Sodium: 127 mmol/L — ABNORMAL LOW (ref 135–145)

## 2024-01-21 LAB — HEPARIN LEVEL (UNFRACTIONATED): Heparin Unfractionated: >1.1 [IU]/mL — ABNORMAL HIGH (ref 0.30–0.70)

## 2024-01-21 LAB — PHOSPHORUS: Phosphorus: 2.9 mg/dL (ref 2.5–4.6)

## 2024-01-21 LAB — APTT: aPTT: 48 s — ABNORMAL HIGH (ref 24–36)

## 2024-01-21 LAB — CREATININE, SERUM
Creatinine, Ser: 1.67 mg/dL — ABNORMAL HIGH (ref 0.44–1.00)
GFR, Estimated: 31 mL/min — ABNORMAL LOW (ref >60)

## 2024-01-21 SURGERY — LEFT HEART CATH AND CORONARY ANGIOGRAPHY
Anesthesia: LOCAL

## 2024-01-21 MED ORDER — LIDOCAINE HCL (PF) 1 % IJ SOLN
INTRAMUSCULAR | Status: DC | PRN
  Administered 2024-01-21: 2 mL

## 2024-01-21 MED ORDER — LORAZEPAM 1 MG PO TABS
1.0000 mg | ORAL_TABLET | Freq: Four times a day (QID) | ORAL | Status: DC | PRN
  Administered 2024-01-21 – 2024-01-23 (×2): 1 mg via ORAL
  Filled 2024-01-21 (×2): qty 1

## 2024-01-21 MED ORDER — LABETALOL HCL 5 MG/ML IV SOLN: 10.0000 mg | INTRAVENOUS | Status: AC | PRN

## 2024-01-21 MED ORDER — HEPARIN (PORCINE) IN NACL 1000-0.9 UT/500ML-% IV SOLN
INTRAVENOUS | Status: DC | PRN
  Administered 2024-01-21 (×2): 500 mL

## 2024-01-21 MED ORDER — SODIUM CHLORIDE 0.9% FLUSH: 3.0000 mL | INTRAVENOUS | Status: DC | PRN

## 2024-01-21 MED ORDER — FENTANYL CITRATE (PF) 100 MCG/2ML IJ SOLN
INTRAMUSCULAR | Status: AC
  Filled 2024-01-21: qty 2

## 2024-01-21 MED ORDER — MORPHINE SULFATE (PF) 2 MG/ML IV SOLN: 1.0000 mg | INTRAVENOUS | Status: DC | PRN

## 2024-01-21 MED ORDER — HYDROMORPHONE HCL 1 MG/ML IJ SOLN: 0.5000 mg | Freq: Once | INTRAMUSCULAR | Status: DC

## 2024-01-21 MED ORDER — OXYCODONE HCL 5 MG PO TABS
5.0000 mg | ORAL_TABLET | Freq: Four times a day (QID) | ORAL | Status: DC | PRN
  Administered 2024-01-22: 5 mg via ORAL
  Administered 2024-01-22 (×2): 10 mg via ORAL
  Filled 2024-01-21: qty 2
  Filled 2024-01-21: qty 1

## 2024-01-21 MED ORDER — SODIUM CHLORIDE 0.9 % IV SOLN: 250.0000 mL | INTRAVENOUS | Status: AC | PRN

## 2024-01-21 MED ORDER — LIDOCAINE HCL (PF) 1 % IJ SOLN
INTRAMUSCULAR | Status: AC
  Filled 2024-01-21: qty 30

## 2024-01-21 MED ORDER — HYDROMORPHONE HCL 1 MG/ML IJ SOLN
1.0000 mg | INTRAMUSCULAR | Status: AC
  Administered 2024-01-21: 1 mg via INTRAVENOUS
  Filled 2024-01-21: qty 1

## 2024-01-21 MED ORDER — HEPARIN SODIUM (PORCINE) 5000 UNIT/ML IJ SOLN
5000.0000 [IU] | Freq: Three times a day (TID) | INTRAMUSCULAR | Status: DC
  Administered 2024-01-21 – 2024-01-22 (×3): 5000 [IU] via SUBCUTANEOUS
  Filled 2024-01-21 (×3): qty 1

## 2024-01-21 MED ORDER — FENTANYL CITRATE (PF) 100 MCG/2ML IJ SOLN
INTRAMUSCULAR | Status: DC | PRN
  Administered 2024-01-21: 25 ug via INTRAVENOUS

## 2024-01-21 MED ORDER — HYDRALAZINE HCL 20 MG/ML IJ SOLN: 10.0000 mg | INTRAMUSCULAR | Status: AC | PRN

## 2024-01-21 MED ORDER — VERAPAMIL HCL 2.5 MG/ML IV SOLN
INTRAVENOUS | Status: AC
  Filled 2024-01-21: qty 2

## 2024-01-21 MED ORDER — HYDROMORPHONE HCL 1 MG/ML IJ SOLN: 1.0000 mg | Freq: Once | INTRAMUSCULAR | Status: DC

## 2024-01-21 MED ORDER — FENTANYL CITRATE PF 50 MCG/ML IJ SOSY
12.5000 ug | PREFILLED_SYRINGE | Freq: Once | INTRAMUSCULAR | Status: AC
  Administered 2024-01-21: 12.5 ug via INTRAVENOUS
  Filled 2024-01-21: qty 1

## 2024-01-21 MED ORDER — SODIUM CHLORIDE 0.9 % IV SOLN: INTRAVENOUS | Status: AC

## 2024-01-21 MED ORDER — FREE WATER
250.0000 mL | Freq: Once | Status: AC
  Administered 2024-01-21: 250 mL via ORAL

## 2024-01-21 MED ORDER — VERAPAMIL HCL 2.5 MG/ML IV SOLN
INTRAVENOUS | Status: DC | PRN
  Administered 2024-01-21: 10 mL via INTRA_ARTERIAL

## 2024-01-21 MED ORDER — SODIUM CHLORIDE 0.9% FLUSH
3.0000 mL | Freq: Two times a day (BID) | INTRAVENOUS | Status: DC
  Administered 2024-01-21 – 2024-01-27 (×10): 3 mL via INTRAVENOUS

## 2024-01-21 MED ORDER — OXYCODONE HCL 5 MG PO TABS
10.0000 mg | ORAL_TABLET | ORAL | Status: DC | PRN
  Administered 2024-01-21: 10 mg via ORAL
  Filled 2024-01-21: qty 2

## 2024-01-21 MED ORDER — IOHEXOL 350 MG/ML SOLN
INTRAVENOUS | Status: DC | PRN
  Administered 2024-01-21: 20 mL via INTRA_ARTERIAL

## 2024-01-21 MED ORDER — HEPARIN SODIUM (PORCINE) 1000 UNIT/ML IJ SOLN
INTRAMUSCULAR | Status: AC
  Filled 2024-01-21: qty 10

## 2024-01-21 MED ORDER — HEPARIN SODIUM (PORCINE) 1000 UNIT/ML IJ SOLN
INTRAMUSCULAR | Status: DC | PRN
  Administered 2024-01-21: 2000 [IU] via INTRAVENOUS

## 2024-01-21 MED ORDER — ACETAMINOPHEN 10 MG/ML IV SOLN: 1000.0000 mg | Freq: Once | INTRAVENOUS | Status: DC

## 2024-01-21 SURGICAL SUPPLY — 8 items
CATH INFINITI MULTIPACK ANG 4F (CATHETERS) IMPLANT
DEVICE RAD TR BAND REGULAR (VASCULAR PRODUCTS) IMPLANT
ELECT DEFIB PAD ADLT CADENCE (PAD) IMPLANT
GLIDESHEATH SLEND SS 6F .021 (SHEATH) IMPLANT
GUIDEWIRE INQWIRE 1.5J.035X260 (WIRE) IMPLANT
MAT PREVALON FULL STRYKER (MISCELLANEOUS) IMPLANT
PACK CARDIAC CATHETERIZATION (CUSTOM PROCEDURE TRAY) ×1 IMPLANT
SET ATX-X65L (MISCELLANEOUS) IMPLANT

## 2024-01-21 NOTE — Plan of Care (Signed)
 Continues back pain Patient is shaking in pain.  Is been going on specifically nighttime every night patient continued to scream for pain and anxious.  Patient already on Tylenol  1000 mg 3 times daily.  Last night I gave Dilaudid  without any effect.  Patient shaking and anxious stating back pain.  Already have x-ray pelvis and CT pelvis no evidence of fracture or dislocation. -Continue Tylenol  1000 mg 3 times daily.  Giving fentanyl  12.5 mg one-time dose.  Continue oxycodone  5-10 mg every 6 hour as needed moderate or severe pain. - Continue Ativan  1 mg Q6 as needed for anxiety. -Apply lower back K-pad.  Trevious Rampey, MD [facility] 01/21/2024, 10:31 PM

## 2024-01-21 NOTE — Progress Notes (Signed)
 PHARMACY - ANTICOAGULATION CONSULT NOTE  Pharmacy Consult for heparin  Indication: atrial fibrillation  Allergies  Allergen Reactions   Flecainide  Other (See Comments)    Rate related aberrancy versus slow VT, asymptomatic    Patient Measurements: Height: 5' 2 (157.5 cm) Weight: 51.1 kg (112 lb 10.5 oz) IBW/kg (Calculated) : 50.1 HEPARIN  DW (KG): 47.7  Vital Signs: Temp: 97.9 F (36.6 C) (09/12 0523) Temp Source: Oral (09/12 0523) BP: 122/55 (09/12 0523) Pulse Rate: 92 (09/12 0523)  Labs: Recent Labs    01/18/24 1158 01/19/24 0443 01/20/24 0541 01/21/24 0430  HGB  --  8.3* 9.3*  --   HCT  --  25.5* 28.6*  --   PLT  --  223 224  --   APTT  --   --   --  48*  HEPARINUNFRC  --   --   --  >1.10*  CREATININE  --  1.07* 1.10*  --   TROPONINIHS 84*  --   --   --     Estimated Creatinine Clearance: 32.3 mL/min (A) (by C-G formula based on SCr of 1.1 mg/dL (H)).  Assessment: 82 year old female with known afib on apixaban . Last dose was this morning per [facility]. Will transition to IV heparin  tonight with plans to transfer patient to [facility] for possible heart cath on 9/12. Hemoglobin stable at 9.3, platelet within normal limits. Currently no bleeding issues noted. Will likely need to follow aptt as heparin  levels likely to be elevated due to recent apixaban  use.   AM: aPTT below goal on 600 units/hr with heparin  level falsely elevated. Per RN, no issues with heparin  infusion running continuously or signs/symptoms of bleeding.  Goal of Therapy:  aPTT goal 66-102s Heparin  level 0.3-0.7 units/ml Monitor platelets by anticoagulation protocol: Yes   Plan:  Increase heparin  to 750 units/hr this evening Check aPTT in 8 hours  Monitor with aPTTs until correlates heparin  level Follow up plan s/p cath  Lynwood Poplar, PharmD, BCPS Clinical Pharmacist 01/21/2024 5:40 AM

## 2024-01-21 NOTE — Progress Notes (Signed)
 PROGRESS NOTE    [name]  FMW:[id number] DOB: 1941-03-27 DOA: 01/15/2024 PCP: Alphonsa Glendia LABOR, MD   Brief Narrative:  [name] is a 83 y.o. female with medical history significant of PAF on anticoagulation and flecainide , ostoporosis, hyperlipidemia, chronic pain on opiates, long hystory of orthostasis and has been on midodrine  intermittently over the last year presented with fall.  She was admitted to hospitalist service and [facility] hospital for evaluation of fall due to orthostasis.  Due to ventricular tachycardia, seen by cardiology there, they recommended cardiac cath, patient transferred to [facility] for that.  Assessment & Plan:   Principal Problem:   Orthostasis Active Problems:   Paroxysmal atrial fibrillation (HCC)   Osteoporosis   Hyponatremia   Leukocytosis  Orthostasis S/p Fall, head trauma- -Long h/o orthostasis, complicated by dehydration. -Continue the use of midodrine  3 times a day - 2D echo demonstrating preserved ejection fraction, no wall motion normalities and no significant valvular disorder. - Continue as needed analgesics.   Ventricular tachycardia- Given ongoing changes and intermittent no sustained episodes of ventricular tachycardia decision has been made to proceed with cardiac cath for further evaluation.  Cardiology managing.  Maintain electrolytes within normal range.   Paroxysmal atrial fibrillation - On Eliquis  PTA, currently transition to heparin  in anticipation of cardiac cath which is scheduled for today.  Patient's PTA flecainide  has been discontinued per cardiology recommendations.  Plan for event monitor at discharge.  Management per cardiology.   AKI on CKD stage IIIa/dehydration: Patient's creatinine varies and ranges anywhere from 0.8-1.2.  It has bumped to 1. 5 9 today.  Avoid nephrotoxic agents.  Resume IV fluids with normal saline at 100 cc/h.   Hyponatremia In the setting of dehydration, down to 127.  Hopefully will improve with  normal saline.  Recheck later today and tomorrow morning.   Leukocytosis Reactive/stress demargination - No signs of acute infection currently appreciated.   Debility- Generalized weakness- -Due to poor oral intake. -Lives alone, seen by PT OT, planning for home health services at discharge. -Patient has been encouraged to continue to participate with mobility specialist for out of bed activities and to sit couple hours at a time on the chair.  DVT prophylaxis: Heparin    Code Status: Full Code  Family Communication:  None present at bedside.  Plan of care discussed with patient in length and he/she verbalized understanding and agreed with it.  Status is: Observation The patient will require care spanning > 2 midnights and should be moved to inpatient because: To be seen by cardiology, needs IV fluids due to dehydration   Estimated body mass index is 20.6 kg/m as calculated from the following:   Height as of this encounter: 5' 2 (1.575 m).   Weight as of this encounter: 51.1 kg.    Nutritional Assessment: Body mass index is 20.6 kg/m.[name] Seen by dietician.  I agree with the assessment and plan as outlined below: Nutrition Status:        . Skin Assessment: I have examined the patient's skin and I agree with the wound assessment as performed by the wound care RN as outlined below:    Consultants:  Cardiology  Procedures:  Above  Antimicrobials:  Anti-infectives (From admission, onward)    None         Subjective: Seen and examined, she complains of headache.  She denies any chest pain, palpitation or shortness of breath or any other complaint.  Appears very weak and visibly dehydrated.  Objective:  Vitals:   01/20/24 2034 01/20/24 2223 01/20/24 2325 01/21/24 0523  BP: (!) 123/53 (!) 139/56 (!) 115/56 (!) 122/55  Pulse: 65 63 73 92  Resp: 16 16 15 16   Temp: 97.9 F (36.6 C) 97.8 F (36.6 C) 97.6 F (36.4 C) 97.9 F (36.6 C)  TempSrc: Oral Oral Oral Oral   SpO2: 98% 93% 92% 92%  Weight:  51.1 kg    Height:        Intake/Output Summary (Last 24 hours) at 01/21/2024 0746 Last data filed at 01/21/2024 0441 Gross per 24 hour  Intake 786.44 ml  Output 700 ml  Net 86.44 ml   Filed Weights   01/15/24 2210 01/20/24 2223  Weight: 47.7 kg 51.1 kg    Examination:  General exam: Appears very weak and visibly dehydrated.  Has very large bump/bruise under left eye.  Nonbleeding but tender to palpation. Respiratory system: Clear to auscultation. Respiratory effort normal. Cardiovascular system: S1 & S2 heard, RRR. No JVD, murmurs, rubs, gallops or clicks. No pedal edema. Gastrointestinal system: Abdomen is nondistended, soft and nontender. No organomegaly or masses felt. Normal bowel sounds heard. Central nervous system: Alert and oriented. No focal neurological deficits. Extremities: Symmetric 5 x 5 power. Skin: No rashes, lesions or ulcers Psychiatry: Judgement and insight appear normal. Mood & affect appropriate.    Data Reviewed: I have personally reviewed following labs and imaging studies  CBC: Recent Labs  Lab 01/15/24 1604 01/16/24 0417 01/18/24 0258 01/19/24 0443 01/20/24 0541 01/21/24 0430 01/21/24 0652  WBC 15.0*   < > 11.4* 14.0* 14.4* 17.1* 16.6*  NEUTROABS 12.5*  --   --   --   --   --   --   HGB 11.4*   < > 9.7* 8.3* 9.3* 9.5* 8.4*  HCT 32.7*   < > 28.5* 25.5* 28.6* 28.8* 25.3*  MCV 97.9   < > 100.0 102.0* 103.6* 101.4* 99.6  PLT 277   < > 234 223 224 250 221   < > = values in this interval not displayed.   Basic Metabolic Panel: Recent Labs  Lab 01/16/24 0417 01/18/24 0258 01/18/24 0740 01/19/24 0443 01/20/24 0541 01/21/24 0430  NA 132* 132*  --  131* 130* 130*  K 3.5 3.8  --  4.3 4.4 4.6  CL 99 101  --  101 96* 94*  CO2 22 22  --  22 23 21*  GLUCOSE 101* 126*  --  122* 131* 113*  BUN 13 22  --  22 22 29*  CREATININE 0.78 1.02*  --  1.07* 1.10* 1.66*  CALCIUM  8.6* 8.7*  --  8.0* 7.9* 8.1*  MG  --  1.5*   --  1.9 1.9 1.9  PHOS  --   --  1.9* 3.4 3.3 2.9   GFR: Estimated Creatinine Clearance: 21.4 mL/min (A) (by C-G formula based on SCr of 1.66 mg/dL (H)). Liver Function Tests: Recent Labs  Lab 01/15/24 1604  AST 18  ALT 12  ALKPHOS 43  BILITOT 0.7  PROT 6.6  ALBUMIN 3.9   No results for input(s): LIPASE, AMYLASE in the last 168 hours. No results for input(s): AMMONIA in the last 168 hours. Coagulation Profile: No results for input(s): INR, PROTIME in the last 168 hours. Cardiac Enzymes: No results for input(s): CKTOTAL, CKMB, CKMBINDEX, TROPONINI in the last 168 hours. BNP (last 3 results) No results for input(s): PROBNP in the last 8760 hours. HbA1C: No results for input(s): HGBA1C in the last 72  hours. CBG: No results for input(s): GLUCAP in the last 168 hours. Lipid Profile: No results for input(s): CHOL, HDL, LDLCALC, TRIG, CHOLHDL, LDLDIRECT in the last 72 hours. Thyroid  Function Tests: Recent Labs    01/18/24 1158  TSH 0.417   Anemia Panel: No results for input(s): VITAMINB12, FOLATE, FERRITIN, TIBC, IRON, RETICCTPCT in the last 72 hours. Sepsis Labs: No results for input(s): PROCALCITON, LATICACIDVEN in the last 168 hours.  No results found for this or any previous visit (from the past 240 hours).   Radiology Studies: ECHOCARDIOGRAM COMPLETE Result Date: 01/19/2024    ECHOCARDIOGRAM REPORT   Patient Name:   ANALAYA HOEY Date of Exam: 01/19/2024 Medical Rec #:  [id number]     Height:       62.0 in Accession #:    [id number]    Weight:       105.2 lb Date of Birth:  04/10/1941    BSA:          1.455 m Patient Age:    82 years      BP:           140/57 mmHg Patient Gender: F             HR:           80 bpm. Exam Location:  [name] Procedure: 2D Echo, Cardiac Doppler and Color Doppler (Both Spectral and Color            Flow Doppler were utilized during procedure). Indications:    Ventricular Tachycardia l47.2   History:        Patient has prior history of Echocardiogram examinations, most                 recent 09/24/2022. Arrythmias:Atrial Fibrillation; Risk                 Factors:Dyslipidemia, Former Smoker and Pre-diabetes. Hx of                 COVID-19.  Sonographer:    Aida Pizza RCS Referring Phys: [id number] [name] IMPRESSIONS  1. Left ventricular ejection fraction, by estimation, is 60 to 65%. The left ventricle has normal function. The left ventricle has no regional wall motion abnormalities. Left ventricular diastolic parameters were normal.  2. Right ventricular systolic function is normal. The right ventricular size is normal. There is normal pulmonary artery systolic pressure. The estimated right ventricular systolic pressure is 33.2 mmHg.  3. A small pericardial effusion is present. The pericardial effusion is posterior to the left ventricle.  4. The mitral valve is grossly normal. Mild mitral valve regurgitation.  5. Tricuspid valve regurgitation is moderate.  6. The aortic valve has an indeterminant number of cusps, cannot exclude functionally bicuspid valve. Aortic valve regurgitation is not visualized. No aortic stenosis is present. Aortic valve mean gradient measures 7.0 mmHg. Calification noted within LVOT and annulus.  7. The inferior vena cava is normal in size with greater than 50% respiratory variability, suggesting right atrial pressure of 3 mmHg. Comparison(s): Prior images reviewed side by side. LVEF 60-65%. Possible functionally bicuspid aortic valve without stenosis. FINDINGS  Left Ventricle: Left ventricular ejection fraction, by estimation, is 60 to 65%. The left ventricle has normal function. The left ventricle has no regional wall motion abnormalities. The left ventricular internal cavity size was normal in size. There is  borderline left ventricular hypertrophy. Left ventricular diastolic parameters were normal. Right Ventricle: The right ventricular size is normal. No increase  in right ventricular wall thickness. Right ventricular systolic function is normal. There is normal pulmonary artery systolic pressure. The tricuspid regurgitant velocity is 2.75 m/s, and  with an assumed right atrial pressure of 3 mmHg, the estimated right ventricular systolic pressure is 33.2 mmHg. Left Atrium: Left atrial size was normal in size. Right Atrium: Right atrial size was normal in size. Pericardium: A small pericardial effusion is present. The pericardial effusion is posterior to the left ventricle. Mitral Valve: The mitral valve is grossly normal. Mild mitral valve regurgitation. Tricuspid Valve: The tricuspid valve is grossly normal. Tricuspid valve regurgitation is moderate. Aortic Valve: The aortic valve has an indeterminant number of cusps. Aortic valve regurgitation is not visualized. No aortic stenosis is present. Aortic valve mean gradient measures 7.0 mmHg. Aortic valve peak gradient measures 14.3 mmHg. Aortic valve area, by VTI measures 1.20 cm. Pulmonic Valve: The pulmonic valve was grossly normal. Pulmonic valve regurgitation is trivial. Aorta: The aortic root is normal in size and structure. Venous: The inferior vena cava is normal in size with greater than 50% respiratory variability, suggesting right atrial pressure of 3 mmHg. IAS/Shunts: No atrial level shunt detected by color flow Doppler. Additional Comments: 3D was performed not requiring image post processing on an independent workstation and was indeterminate.  LEFT VENTRICLE PLAX 2D LVIDd:         4.20 cm   Diastology LVIDs:         2.40 cm   LV e' medial:    12.10 cm/s LV PW:         1.00 cm   LV E/e' medial:  9.0 LV IVS:        0.90 cm   LV e' lateral:   14.20 cm/s LVOT diam:     1.60 cm   LV E/e' lateral: 7.7 LV SV:         53 LV SV Index:   37 LVOT Area:     2.01 cm  RIGHT VENTRICLE RV S prime:     14.00 cm/s TAPSE (M-mode): 2.8 cm LEFT ATRIUM             Index        RIGHT ATRIUM           Index LA diam:        2.90 cm 1.99  cm/m   RA Area:     12.60 cm LA Vol (A2C):   20.8 ml 14.30 ml/m  RA Volume:   25.70 ml  17.67 ml/m LA Vol (A4C):   32.2 ml 22.14 ml/m LA Biplane Vol: 26.1 ml 17.94 ml/m  AORTIC VALVE AV Area (Vmax):    1.16 cm AV Area (Vmean):   1.20 cm AV Area (VTI):     1.20 cm AV Vmax:           189.00 cm/s AV Vmean:          125.000 cm/s AV VTI:            0.444 m AV Peak Grad:      14.3 mmHg AV Mean Grad:      7.0 mmHg LVOT Vmax:         109.00 cm/s LVOT Vmean:        74.900 cm/s LVOT VTI:          0.265 m LVOT/AV VTI ratio: 0.60  AORTA Ao Root diam: 2.80 cm MITRAL VALVE  TRICUSPID VALVE MV Area (PHT): 3.37 cm     TR Peak grad:   30.2 mmHg MV Decel Time: 225 msec     TR Vmax:        275.00 cm/s MV E velocity: 109.00 cm/s MV A velocity: 88.00 cm/s   SHUNTS MV E/A ratio:  1.24         Systemic VTI:  0.26 m                             Systemic Diam: 1.60 cm Jayson Sierras MD Electronically signed by Jayson Sierras MD Signature Date/Time: 01/19/2024/3:00:38 PM    Final     Scheduled Meds:  acetaminophen   1,000 mg Oral TID   feeding supplement  237 mL Oral BID BM   lactulose   20 g Oral QHS   metoprolol  tartrate  25 mg Oral BID   midodrine   5 mg Oral TID WC   pantoprazole   40 mg Oral Daily   rosuvastatin   40 mg Oral Daily   Continuous Infusions:  heparin  750 Units/hr (01/21/24 0644)   sodium chloride        LOS: 0 days   Fredia Skeeter, MD [facility]  01/21/2024, 7:46 AM   *Please note that this is a verbal dictation therefore any spelling or grammatical errors are due to the Dragon Medical One system interpretation.  Please page via Amion and do not message via secure chat for urgent patient care matters. Secure chat can be used for non urgent patient care matters.  How to contact the [facility] Attending or Consulting provider 7A - 7P or covering provider during after hours 7P -7A, for this patient?  Check the care team in [facility] and look for a) attending/consulting [facility] provider  listed and b) the [facility] team listed. Page or secure chat 7A-7P. Log into [email or web address] and use [facility]'s universal password to access. If you do not have the password, please contact the hospital operator. Locate the [facility] provider you are looking for under [facility] and page to a number that you can be directly reached. If you still have difficulty reaching the provider, please page the [facility] (Director on Call) for the Hospitalists listed on amion for assistance.

## 2024-01-21 NOTE — Progress Notes (Signed)
 OT Cancellation Note  Patient Details Name: [name] MRN: [id number] DOB: 02-Dec-1940   Cancelled Treatment:    Reason Eval/Treat Not Completed: Patient at procedure or test/ unavailable. Pt off unit for scheduled LHC. Will follow up as able to.   Nishant Schrecengost C, OT  Acute Rehabilitation Services Office [phone] Secure chat preferred   [name] Savers 01/21/2024, 2:08 PM

## 2024-01-21 NOTE — Plan of Care (Signed)
 Patient is screaming for pain of the face, back and arm from the recent fall.  Oral oxycodone  is not elevating the pain.  Giving Dilaudid  1 mg one-time dose.   Dannette Kinkaid, MD [facility] 01/21/2024, 4:05 AM

## 2024-01-21 NOTE — Plan of Care (Signed)
 Patient again is screaming in pain stating that Dilaudid  did not touch her for the pain.  Neither oxycodone  and Robaxin .  Patient allergic to lidocaine  patch.  Complaining about back pain.  X-ray pelvis was obtained earlier which did not showed any evidence of fracture. - Given patient presenting with a fall getting CT pelvis to rule out occult fracture.

## 2024-01-21 NOTE — Progress Notes (Signed)
   Rounding Note    Patient Name: [name] Date of Encounter: 01/21/2024  [facility] Cardiologist: Alvan Carrier, MD   Subjective   Her main concern is back pain--she does not think she could comfortably lie flat for a long time if femoral access needed. She does think she could lay on the table with a pillow, especially with light sedation. Reviewed cath, reason, procedure today. Reviewed her echo results with her.  Inpatient Medications    Scheduled Meds:  acetaminophen   1,000 mg Oral TID   feeding supplement  237 mL Oral BID BM   lactulose   20 g Oral QHS   metoprolol  tartrate  25 mg Oral BID   midodrine   5 mg Oral TID WC   pantoprazole   40 mg Oral Daily   rosuvastatin   40 mg Oral Daily   Continuous Infusions:  heparin  750 Units/hr (01/21/24 0644)   sodium chloride      PRN Meds: methocarbamol , oxyCODONE , prochlorperazine    Vital Signs    Vitals:   01/21/24 0824 01/21/24 0852 01/21/24 0854 01/21/24 0914  BP: (!) 131/47  (!) 129/58 (!) 129/58  Pulse: 82  79 78  Resp:      Temp:      TempSrc:      SpO2: 90%  90%   Weight:  51.1 kg    Height:  5' 2 (1.575 m)      Intake/Output Summary (Last 24 hours) at 01/21/2024 0926 Last data filed at 01/21/2024 0441 Gross per 24 hour  Intake 666.44 ml  Output 700 ml  Net -33.56 ml      01/21/2024    8:52 AM 01/20/2024   10:23 PM 01/15/2024   10:10 PM  Last 3 Weights  Weight (lbs) 112 lb 10.5 oz 112 lb 10.5 oz 105 lb 2.6 oz  Weight (kg) 51.1 kg 51.1 kg 47.7 kg      Telemetry    Sinus with occasional PAC - Personally Reviewed  Physical Exam   GEN: No acute distress.  Mass under L eye Neck: No JVD Cardiac: RRR, no murmurs, rubs, or gallops.  Respiratory: Clear to auscultation bilaterally. GI: Soft, nontender, non-distended  MS: No edema; No deformity. Neuro:  Nonfocal  Psych: Normal affect   New pertinent results (labs, ECG, imaging, cardiac studies)    Echo personally reviewed, no  significant change Cath pending today  Assessment & Plan    Wide complex tachycardia Recurrent syncope -flecainide  discontinued -echo unremarkable -transferred to [facility] for cath today. I reviewed and discussed with her. Also consented yesterday by Dr. Mallipeddi, see note -last dose of apixaban  9/11 AM -has not had any events on telemetry here -has order for outpatient monitor -started on metoprolol  25 mg BID  Paroxysmal atrial fibrillation -apixaban  on hold as above -flecainide  stopped with WCT/syncope as above -has been sinus with occasional PAC since arriving here -restart post cath, timing dependent on access site/findings  Orthostatic hypotension Falls -chronic, on midodrine  -PT/OT  Acute kidney injury Hyponatremia -now downtrending Cr, but Na also dropped today -suspect I/O inaccurate as weight up 4 kg but charted net negative 1 L  Chronic anema -Hgb 8.4 today, range has been 8.3-11.4 since admission (was dehydrated on presentation, when Hgb was 11.4)    Signed, Shelda Bruckner, MD  01/21/2024, 9:26 AM

## 2024-01-22 ENCOUNTER — Encounter (HOSPITAL_COMMUNITY): Payer: Self-pay | Admitting: Cardiology

## 2024-01-22 DIAGNOSIS — I951 Orthostatic hypotension: Secondary | ICD-10-CM | POA: Diagnosis not present

## 2024-01-22 LAB — BASIC METABOLIC PANEL WITH GFR
Anion gap: 15 (ref 5–15)
BUN: 33 mg/dL — ABNORMAL HIGH (ref 8–23)
CO2: 20 mmol/L — ABNORMAL LOW (ref 22–32)
Calcium: 7.9 mg/dL — ABNORMAL LOW (ref 8.9–10.3)
Chloride: 95 mmol/L — ABNORMAL LOW (ref 98–111)
Creatinine, Ser: 1.61 mg/dL — ABNORMAL HIGH (ref 0.44–1.00)
GFR, Estimated: 32 mL/min — ABNORMAL LOW (ref >60)
Glucose, Bld: 93 mg/dL (ref 70–99)
Potassium: 4.8 mmol/L (ref 3.5–5.1)
Sodium: 130 mmol/L — ABNORMAL LOW (ref 135–145)

## 2024-01-22 LAB — CBC
HCT: 28.4 % — ABNORMAL LOW (ref 36.0–46.0)
Hemoglobin: 9.2 g/dL — ABNORMAL LOW (ref 12.0–15.0)
MCH: 33.2 pg (ref 26.0–34.0)
MCHC: 32.4 g/dL (ref 30.0–36.0)
MCV: 102.5 fL — ABNORMAL HIGH (ref 80.0–100.0)
Platelets: 264 K/uL (ref 150–400)
RBC: 2.77 MIL/uL — ABNORMAL LOW (ref 3.87–5.11)
RDW: 12 % (ref 11.5–15.5)
WBC: 16.4 K/uL — ABNORMAL HIGH (ref 4.0–10.5)
nRBC: 0 % (ref 0.0–0.2)

## 2024-01-22 LAB — HEPARIN LEVEL (UNFRACTIONATED): Heparin Unfractionated: >1.1 [IU]/mL — ABNORMAL HIGH (ref 0.30–0.70)

## 2024-01-22 LAB — APTT
aPTT: 32 s (ref 24–36)
aPTT: 60 s — ABNORMAL HIGH (ref 24–36)

## 2024-01-22 MED ORDER — HYDROCODONE-ACETAMINOPHEN 10-325 MG PO TABS
1.0000 | ORAL_TABLET | Freq: Four times a day (QID) | ORAL | Status: DC | PRN
  Administered 2024-01-22 – 2024-01-27 (×19): 1 via ORAL
  Filled 2024-01-22 (×20): qty 1

## 2024-01-22 MED ORDER — SODIUM CHLORIDE 0.9 % IV SOLN: INTRAVENOUS | Status: DC

## 2024-01-22 MED ORDER — HEPARIN (PORCINE) 25000 UT/250ML-% IV SOLN
850.0000 [IU]/h | INTRAVENOUS | Status: DC
  Administered 2024-01-22: 750 [IU]/h via INTRAVENOUS
  Administered 2024-01-23: 850 [IU]/h via INTRAVENOUS
  Filled 2024-01-22 (×2): qty 250

## 2024-01-22 NOTE — Progress Notes (Signed)
 PHARMACY - ANTICOAGULATION CONSULT NOTE  Pharmacy Consult for IV heparin  Indication: atrial fibrillation  Allergies  Allergen Reactions   Flecainide  Other (See Comments)    Rate related aberrancy versus slow VT, asymptomatic   Patient Measurements: Height: 5' 2 (157.5 cm) Weight: 51.1 kg (112 lb 10.5 oz) IBW/kg (Calculated) : 50.1 HEPARIN  DW (KG): 51.1  Vital Signs: Temp: 98.3 F (36.8 C) (09/13 2035) Temp Source: Oral (09/13 2035) BP: 132/56 (09/13 2035) Pulse Rate: 75 (09/13 2035)  Labs: Recent Labs    01/21/24 0430 01/21/24 0652 01/21/24 1455 01/21/24 1504 01/21/24 1607 01/22/24 0536 01/22/24 2115  HGB 9.5* 8.4*   < > 8.5* 9.2* 9.2*  --   HCT 28.8* 25.3*   < > 25.0* 27.2* 28.4*  --   PLT 250 221  --   --  229 264  --   APTT 48*  --   --   --   --  32 60*  HEPARINUNFRC >1.10*  --   --   --   --   --  >1.10*  CREATININE 1.66* 1.59*  --   --  1.67* 1.61*  --    < > = values in this interval not displayed.    Estimated Creatinine Clearance: 22 mL/min (A) (by C-G formula based on SCr of 1.61 mg/dL (H)).   Medical History: Past Medical History:  Diagnosis Date   Adenocarcinoma, colon (HCC) 03/2005   Stage I; resected in 03/2005   Arthritis    Colon cancer [facility])    Removed surgicaly    Dysrhythmia    Gastroesophageal reflux disease    Gastroparesis    Hearing loss    mild   Hyperlipidemia    Irregular heartbeat    Ovarian cyst 2008   Pain in joint of right shoulder 06/29/2017   Pain in joint, ankle and foot 03/13/2014   Paroxysmal atrial fibrillation (HCC)    Palpitations; maintained on flecainide ; -normal coronary angiography and 1999; negative stress nuclear study in 11/2005   PONV (postoperative nausea and vomiting)    Tobacco abuse, in remission    10-pack-years; quit in 2001   Medications:  Scheduled:   acetaminophen   1,000 mg Oral TID   feeding supplement  237 mL Oral BID BM   lactulose   20 g Oral QHS   metoprolol  tartrate  25 mg Oral BID    midodrine   5 mg Oral TID WC   pantoprazole   40 mg Oral Daily   rosuvastatin   40 mg Oral Daily   sodium chloride  flush  3 mL Intravenous Q12H   Infusions:   sodium chloride  125 mL/hr at 01/22/24 2151   heparin  750 Units/hr (01/22/24 1956)   sodium chloride  Stopped (01/21/24 1900)   Assessment: Patient is 83 YO F presenting to OSH for fall and transferred to [facility] for cardiac cath. Patient with known afib on DOAC PTA. Last dose of apixaban  was in the AM of 9/11. Patient received IV heparin  9/11-9/12 in anticipation of cardiac cath which occurred on 9/12. Patient was subtherapeutic on rate of 600 units/h with aPTT of 48.   Now restarting anticoagulation with IV heparin  pending possibility of EP device placement; plan is for EP to evaluate patient Monday. Pharmacy has been consulted to dose IV heparin  in the meantime. Hgb stable and platelets WNL.   PM aPTT 60.  No known issues with IV infusion.  No overt bleeding or complications noted.  Goal of Therapy:  Heparin  level 0.3-0.7 units/ml aPTT 66-102 seconds  Monitor platelets by anticoagulation protocol: Yes   Plan:  Increase IV heparin  to 850 units/hr. Check aPTT and heparin  level in 8h.  Monitor aPTT, heparin  level, CBC, signs/sx bleeding daily.   Harlene Barlow, [facility] [employer], BCCP Clinical Pharmacist  01/22/2024 10:30 PM   [facility] pharmacy phone numbers are listed on [email or web address]

## 2024-01-22 NOTE — Progress Notes (Signed)
 Patient assisted OOB to chair for dinner. Patient sat on side of bed x5 minutes. Stood in place x 2 minutes, and marched in place (x 3 marches  both legs).

## 2024-01-22 NOTE — Progress Notes (Signed)
 Mobility Specialist Progress Note:   01/22/24 1100  Mobility  Activity Pivoted/transferred from chair to bed  Level of Assistance Minimal assist, patient does 75% or more (+2)  Assistive Device Other (Comment) (HHA)  Distance Ambulated (ft) 2 ft  Activity Response Tolerated fair  Mobility Referral Yes  Mobility visit 1 Mobility  Mobility Specialist Start Time (ACUTE ONLY) 1100  Mobility Specialist Stop Time (ACUTE ONLY) 1113  Mobility Specialist Time Calculation (min) (ACUTE ONLY) 13 min   Pt agreeable to mobility session. Tolerated sitting in chair for ~1 hour. Pt very lethargic today, likely d/t ativan . No c/o throughout. Back in bed with all needs met.   [name] Mobility Specialist Please contact via SecureChat or  Rehab office at [phone]

## 2024-01-22 NOTE — Progress Notes (Signed)
 Rounding Note    Patient Name: [name] Date of Encounter: 01/22/2024  [facility] Cardiologist: Alvan Carrier, MD   Subjective   Her main concern is back pain--she does not think she could comfortably lie flat for a long time if femoral access needed. She does think she could lay on the table with a pillow, especially with light sedation. Reviewed cath, reason, procedure today. Reviewed her echo results with her.  Inpatient Medications    Scheduled Meds:  acetaminophen   1,000 mg Oral TID   feeding supplement  237 mL Oral BID BM   heparin   5,000 Units Subcutaneous Q8H   lactulose   20 g Oral QHS   metoprolol  tartrate  25 mg Oral BID   midodrine   5 mg Oral TID WC   pantoprazole   40 mg Oral Daily   rosuvastatin   40 mg Oral Daily   sodium chloride  flush  3 mL Intravenous Q12H   Continuous Infusions:  sodium chloride  Stopped (01/21/24 2245)   sodium chloride  125 mL/hr at 01/22/24 1038   sodium chloride  Stopped (01/21/24 1900)   PRN Meds: sodium chloride , LORazepam , methocarbamol , oxyCODONE , prochlorperazine , sodium chloride  flush   Vital Signs    Vitals:   01/22/24 0757 01/22/24 0956 01/22/24 1041 01/22/24 1227  BP: (!) 148/53 (!) 146/95 (!) 154/66 (!) 156/60  Pulse: 87 82 (!) 50 (!) 108  Resp: 17 18  17   Temp: 100 F (37.8 C)   98.5 F (36.9 C)  TempSrc: Oral   Oral  SpO2: 99% 100% 95% 98%  Weight:      Height:        Intake/Output Summary (Last 24 hours) at 01/22/2024 1248 Last data filed at 01/21/2024 2345 Gross per 24 hour  Intake 1318.1 ml  Output 150 ml  Net 1168.1 ml      01/21/2024    8:52 AM 01/20/2024   10:23 PM 01/15/2024   10:10 PM  Last 3 Weights  Weight (lbs) 112 lb 10.5 oz 112 lb 10.5 oz 105 lb 2.6 oz  Weight (kg) 51.1 kg 51.1 kg 47.7 kg      Telemetry    Sinus with occasional PAC - Personally Reviewed  Physical Exam   GEN: No acute distress.  Mass under L eye Neck: No JVD Cardiac: RRR, no murmurs, rubs, or gallops.   Respiratory: Clear to auscultation bilaterally. GI: Soft, nontender, non-distended  MS: No edema; No deformity. Neuro:  Nonfocal  Psych: Normal affect   New pertinent results (labs, ECG, imaging, cardiac studies)    Echo personally reviewed, no significant change Cath pending today  Assessment & Plan    Wide complex tachycardia Recurrent syncope -flecainide  discontinued -echo unremarkable -normal coronary arteries on cath.  -last dose of apixaban  9/11 AM, will continue IV heparin  for afib in the event EP device indicated. Will have EP see Monday.  -has not had any events on telemetry here -started on metoprolol  25 mg BID - plan for cardiac MRI Monday to evaluate for myocardial scar.   Paroxysmal atrial fibrillation -apixaban  on hold as above, would continue IV heparin . -flecainide  stopped with WCT/syncope as above -has been sinus with occasional PAC since arriving here  Orthostatic hypotension Falls -chronic, on midodrine  -PT/OT  Acute kidney injury Hyponatremia -now downtrending Cr, but Na also dropped today -suspect I/O inaccurate   Chronic anema -Hgb 9.2 today, range has been 8.3-11.4 since admission (was dehydrated on presentation, when Hgb was 11.4)    Signed, Bexley Laubach A Tramar Brueckner, MD  01/22/2024, 12:48 PM

## 2024-01-22 NOTE — Progress Notes (Signed)
 Patient assisted back to bed at 1830. Patient OOB to chair an estimated 60 minutes.

## 2024-01-22 NOTE — Progress Notes (Signed)
 Cleansed left eye with warm washcloth and water . Dried tan drainage removed/cleansed form site. Patient able to open eyes. Pupils equal and reactice. Patient able to successful complete six cardinal positions of gaze with noted smooth movement (no jerking movement). Assessing each eye individually, patient successfully completed confrontation vision test. She accurately identified the number of fingers in all four quadrants.   Left eye brow abrasion cleansed with warm washcloth and water . Xeroform, gauze, and paper tape dressing applied.   Left cheek abrasion cleansed with warm washcloth and water . Xeroform, gauze, and paper tape dressing applied.

## 2024-01-22 NOTE — Progress Notes (Addendum)
 PROGRESS NOTE    [name]  FMW:[id number] DOB: Apr 24, 1941 DOA: 01/15/2024 PCP: Alphonsa Glendia LABOR, MD   Brief Narrative:  [name] is a 83 y.o. female with medical history significant of PAF on anticoagulation and flecainide , ostoporosis, hyperlipidemia, chronic pain on opiates, long hystory of orthostasis and has been on midodrine  intermittently over the last year presented with fall.  She was [facility] hospitalist service and [facility] for [facility] of fall due to orthostasis.  Due to ventricular tachycardia, seen by cardiology there, they recommended cardiac cath, patient transferred to [facility] for that.  Assessment & Plan:   Principal Problem:   Orthostasis Active Problems:   Paroxysmal atrial fibrillation (HCC)   Osteoporosis   Hyponatremia   Leukocytosis  Orthostasis S/p Fall, head trauma- -Long h/o orthostasis, complicated by dehydration. -Continue the use of midodrine  3 times a day - 2D echo demonstrating preserved ejection fraction, no wall motion normalities and no significant valvular disorder. - Continue as needed analgesics.   Ventricular tachycardia- Given ongoing changes and intermittent no sustained episodes of ventricular tachycardia decision has been made to proceed with cardiac cath for further evaluation.  Status postcardiac cath 01/21/2024 with normal coronary arteries.  Cardiology plans to get cardiac MRI on Monday.   Paroxysmal atrial fibrillation - On Eliquis  PTA, transitioned to heparin  for now per cardiology.  Rates controlled.   AKI on CKD stage IIIa/dehydration: Patient's creatinine varies and ranges anywhere from 0.8-1.2.  It peaked at 1.67 yesterday and down to 1.61 today.  Initially it was elevated due to dehydration and then she had contrast due to cardiac cath yesterday.  Will resume fluids again with normal saline at 125 cc/h for next 24 hours and recheck labs in the morning.     Hyponatremia In the setting of dehydration, likely  hypovolemic, improved to 130 from 127 yesterday.  Resuming normal saline, hopefully she will improve further.  Chronic back pain: Patient complains of mild pain in the morning when I see her however at night, she starts crying due to uncontrolled pain and receives couple of IV Dilaudid  doses.  Discussed with her DIL, she states that patient's pain is usually much better controlled at home on her hydrocodone  medications which she takes every 4 hours as needed.  Will resume her home dose of hydrocodone  as needed and discontinue oxycodone .   Leukocytosis Reactive/stress demargination - No signs of acute infection currently appreciated.  Leukocytosis improving.  Large bruise under left eye: This happened a week ago due to fall.  Nurses have been advised to apply cool compresses every 2-3 hours.   Debility- Generalized weakness- -Due to poor oral intake. -Lives alone, seen by PT OT, planning for home health services at discharge. -Patient has been encouraged to continue to participate with mobility specialist for out of bed activities and to sit couple hours at a time on the chair.  DVT prophylaxis: Heparin    Code Status: Full Code  Family Communication:  None present at bedside.  Plan of care discussed with patient in length and he/she verbalized understanding and agreed with it.  I also called patient's daughter-in-law [name] over the phone.  She had several questions and concerns.  She wanted me to consult nephrology for AKI.  I counseled her that the AKI is secondary to dehydration on top of contrast and should improve by tomorrow with IV fluids and if not, we can consider nephrology but at the moment there is no indication to consult them.  I  also answered her question about the bruise at the left face.  She was aware about the  cardiac cath reports and the fact that cardiac MRI is going to happen on Monday by cardiology and EP will see patient as well.  She requested PT OT consult and order placed.   Spent about 15 minutes talking to her.  Status is: Inpatient Remains inpatient appropriate because: Cardiology plans cardiac MRI on Monday and EP to see on Monday as well     Estimated body mass index is 20.6 kg/m as calculated from the following:   Height as of this encounter: 5' 2 (1.575 m).   Weight as of this encounter: 51.1 kg.    Nutritional Assessment: Body mass index is 20.6 kg/m.SABRA Seen by dietician.  I agree with the assessment and plan as outlined below: Nutrition Status:        . Skin Assessment: I have examined the patient's skin and I agree with the wound assessment as performed by the wound care RN as outlined below:    Consultants:  Cardiology  Procedures:  Above  Antimicrobials:  Anti-infectives (From admission, onward)    None         Subjective: Patient seen and examined earlier today.  She was sitting in the recliner, was complaining of mild back pain but nothing extra wakens her and she was feeling comfortable.  She was also complaining of some pain at the left face due to bruise.  Objective: Vitals:   01/22/24 0757 01/22/24 0956 01/22/24 1041 01/22/24 1227  BP: (!) 148/53 (!) 146/95 (!) 154/66 (!) 156/60  Pulse: 87 82 (!) 50 (!) 108  Resp: 17 18  17   Temp: 100 F (37.8 C)   98.5 F (36.9 C)  TempSrc: Oral   Oral  SpO2: 99% 100% 95% 98%  Weight:      Height:        Intake/Output Summary (Last 24 hours) at 01/22/2024 1321 Last data filed at 01/21/2024 2345 Gross per 24 hour  Intake 1318.1 ml  Output 150 ml  Net 1168.1 ml   Filed Weights   01/15/24 2210 01/20/24 2223 01/21/24 0852  Weight: 47.7 kg 51.1 kg 51.1 kg    Examination:  General exam: Appears calm and comfortable, large bruise and bump at the left face under the left eye. Respiratory system: Clear to auscultation. Respiratory effort normal. Cardiovascular system: S1 & S2 heard, RRR. No JVD, murmurs, rubs, gallops or clicks. No pedal edema. Gastrointestinal  system: Abdomen is nondistended, soft and nontender. No organomegaly or masses felt. Normal bowel sounds heard. Central nervous system: Alert and oriented. No focal neurological deficits. Extremities: Symmetric 5 x 5 power. Skin: No rashes, lesions or ulcers.  Psychiatry: Judgement and insight appear normal. Mood & affect appropriate.    Data Reviewed: I have personally reviewed following labs and imaging studies  CBC: Recent Labs  Lab 01/15/24 1604 01/16/24 0417 01/20/24 0541 01/21/24 0430 01/21/24 0652 01/21/24 1455 01/21/24 1504 01/21/24 1607 01/22/24 0536  WBC 15.0*   < > 14.4* 17.1* 16.6*  --   --  19.7* 16.4*  NEUTROABS 12.5*  --   --   --   --   --   --   --   --   HGB 11.4*   < > 9.3* 9.5* 8.4* 8.8* 8.5* 9.2* 9.2*  HCT 32.7*   < > 28.6* 28.8* 25.3* 26.0* 25.0* 27.2* 28.4*  MCV 97.9   < > 103.6* 101.4* 99.6  --   --  100.4* 102.5*  PLT 277   < > 224 250 221  --   --  229 264   < > = values in this interval not displayed.   Basic Metabolic Panel: Recent Labs  Lab 01/18/24 0258 01/18/24 0740 01/19/24 0443 01/20/24 0541 01/21/24 0430 01/21/24 0652 01/21/24 1455 01/21/24 1504 01/21/24 1607 01/22/24 0536  NA 132*  --  131* 130* 130* 127* 126* 127*  --  130*  K 3.8  --  4.3 4.4 4.6 4.3 4.7 4.4  --  4.8  CL 101  --  101 96* 94* 96*  --   --   --  95*  CO2 22  --  22 23 21* 21*  --   --   --  20*  GLUCOSE 126*  --  122* 131* 113* 111*  --   --   --  93  BUN 22  --  22 22 29* 29*  --   --   --  33*  CREATININE 1.02*  --  1.07* 1.10* 1.66* 1.59*  --   --  1.67* 1.61*  CALCIUM  8.7*  --  8.0* 7.9* 8.1* 7.7*  --   --   --  7.9*  MG 1.5*  --  1.9 1.9 1.9  --   --   --   --   --   PHOS  --  1.9* 3.4 3.3 2.9  --   --   --   --   --    GFR: Estimated Creatinine Clearance: 22 mL/min (A) (by C-G formula based on SCr of 1.61 mg/dL (H)). Liver Function Tests: Recent Labs  Lab 01/15/24 1604  AST 18  ALT 12  ALKPHOS 43  BILITOT 0.7  PROT 6.6  ALBUMIN 3.9   No results  for input(s): LIPASE, AMYLASE in the last 168 hours. No results for input(s): AMMONIA in the last 168 hours. Coagulation Profile: No results for input(s): INR, PROTIME in the last 168 hours. Cardiac Enzymes: No results for input(s): CKTOTAL, CKMB, CKMBINDEX, TROPONINI in the last 168 hours. BNP (last 3 results) No results for input(s): PROBNP in the last 8760 hours. HbA1C: No results for input(s): HGBA1C in the last 72 hours. CBG: No results for input(s): GLUCAP in the last 168 hours. Lipid Profile: No results for input(s): CHOL, HDL, LDLCALC, TRIG, CHOLHDL, LDLDIRECT in the last 72 hours. Thyroid  Function Tests: No results for input(s): TSH, T4TOTAL, FREET4, T3FREE, THYROIDAB in the last 72 hours.  Anemia Panel: No results for input(s): VITAMINB12, FOLATE, FERRITIN, TIBC, IRON, RETICCTPCT in the last 72 hours. Sepsis Labs: No results for input(s): PROCALCITON, LATICACIDVEN in the last 168 hours.  No results found for this or any previous visit (from the past 240 hours).   Radiology Studies: CARDIAC CATHETERIZATION Result Date: 01/21/2024 Table formatting from the original result was not included. Images from the original result were not included. Dominance: Right -> Angiographically Normal Coronary Arteries.  Normal LVEDP.  Nonischemic etiology for VT RECOMMENDATIONS   Anticipated discharge date to be determined.   Recommend to resume Apixaban , at currently prescribed dose and frequency on 01/22/2024.   Could reinitiate Apixaban  as early as a.m. of 01/22/2024 versus placed back on IV heparin  pending further evaluation. Alm Clay, MD   CT PELVIS WO CONTRAST Result Date: 01/21/2024 EXAM: CT Pelvis, Without IV Contrast 01/21/2024 11:09:00 AM TECHNIQUE: Axial images were acquired through the pelvis without IV contrast. Reformatted images were reviewed. Automated exposure control, iterative reconstruction, and/or weight based  adjustment of the mA/kV was utilized to reduce the radiation dose to as low as reasonably achievable. COMPARISON: None available. CLINICAL HISTORY: Fall concern for pelvic fracture. FINDINGS: BONES: No acute fracture is present. Mild degenerative changes are present at the SI joints bilaterally. Facet degenerative changes are present at L4-5 and L5-S1. JOINTS: The hips are located. Mild degenerative changes are present bilaterally. SOFT TISSUES: A 4 cm right adnexal cyst is present. INTRAPELVIC CONTENTS: Atherosclerotic changes are present in the distal aorta and proximal iliac vessels without aneurysm. IMPRESSION: 1. No acute pelvic fracture. 2. 4 cm right adnexal cyst. This is a probable benign cyst. Recommend follow-up ultrasound in 3 to 6 months Electronically signed by: Lonni Necessary MD 01/21/2024 11:55 AM EDT RP Workstation: [id number]    Scheduled Meds:  acetaminophen   1,000 mg Oral TID   feeding supplement  237 mL Oral BID BM   lactulose   20 g Oral QHS   metoprolol  tartrate  25 mg Oral BID   midodrine   5 mg Oral TID WC   pantoprazole   40 mg Oral Daily   rosuvastatin   40 mg Oral Daily   sodium chloride  flush  3 mL Intravenous Q12H   Continuous Infusions:  sodium chloride  Stopped (01/21/24 2245)   sodium chloride  125 mL/hr at 01/22/24 1038   heparin      sodium chloride  Stopped (01/21/24 1900)     LOS: 1 day   Fredia Skeeter, MD [facility]  01/22/2024, 1:21 PM   *Please note that this is a verbal dictation therefore any spelling or grammatical errors are due to the Dragon Medical One system interpretation.  Please page via Amion and do not message via secure chat for urgent patient care matters. Secure chat can be used for non urgent patient care matters.  How to contact the [facility] Attending or Consulting provider 7A - 7P or covering provider during after hours 7P -7A, for this patient?  Check the care team in [facility] and look for a) attending/consulting [facility] provider listed  and b) the [facility] team listed. Page or secure chat 7A-7P. Log into [email or web address] and use [facility] universal password to access. If you do not have the password, please contact the hospital operator. Locate the [facility] provider you are looking for under [facility] and page to a number that you can be directly reached. If you still have difficulty reaching the provider, please page the [facility] (Director on Call) for the Hospitalists listed on amion for assistance.

## 2024-01-22 NOTE — Progress Notes (Signed)
 Ice pack applied to facial injury @ 1520. Ice pack removed @ approximately 1545.

## 2024-01-22 NOTE — Progress Notes (Signed)
 Ice pack applied at 1845 to facial injury Ice pack removed at 1900

## 2024-01-22 NOTE — Progress Notes (Signed)
 Per MD, apply ice pack to facial injury every 2-3 hours for 15 minutes at a time.   @ 1320 ice pack applied @ 1335 ice pack removed  Patient tolerated cold therapy.

## 2024-01-22 NOTE — Progress Notes (Deleted)
 Urine sample collected and sent to lab 01/22/2024 at 0830.

## 2024-01-22 NOTE — Progress Notes (Signed)
 PHARMACY - ANTICOAGULATION CONSULT NOTE  Pharmacy Consult for IV heparin  Indication: atrial fibrillation  Allergies  Allergen Reactions   Flecainide  Other (See Comments)    Rate related aberrancy versus slow VT, asymptomatic   Patient Measurements: Height: 5' 2 (157.5 cm) Weight: 51.1 kg (112 lb 10.5 oz) IBW/kg (Calculated) : 50.1 HEPARIN  DW (KG): 51.1  Vital Signs: Temp: 98.5 F (36.9 C) (09/13 1227) Temp Source: Oral (09/13 1227) BP: 156/60 (09/13 1227) Pulse Rate: 108 (09/13 1227)  Labs: Recent Labs    01/21/24 0430 01/21/24 0652 01/21/24 1455 01/21/24 1504 01/21/24 1607 01/22/24 0536  HGB 9.5* 8.4*   < > 8.5* 9.2* 9.2*  HCT 28.8* 25.3*   < > 25.0* 27.2* 28.4*  PLT 250 221  --   --  229 264  APTT 48*  --   --   --   --  32  HEPARINUNFRC >1.10*  --   --   --   --   --   CREATININE 1.66* 1.59*  --   --  1.67* 1.61*   < > = values in this interval not displayed.    Estimated Creatinine Clearance: 22 mL/min (A) (by C-G formula based on SCr of 1.61 mg/dL (H)).   Medical History: Past Medical History:  Diagnosis Date   Adenocarcinoma, colon (HCC) 03/2005   Stage I; resected in 03/2005   Arthritis    Colon cancer [facility])    Removed surgicaly    Dysrhythmia    Gastroesophageal reflux disease    Gastroparesis    Hearing loss    mild   Hyperlipidemia    Irregular heartbeat    Ovarian cyst 2008   Pain in joint of right shoulder 06/29/2017   Pain in joint, ankle and foot 03/13/2014   Paroxysmal atrial fibrillation (HCC)    Palpitations; maintained on flecainide ; -normal coronary angiography and 1999; negative stress nuclear study in 11/2005   PONV (postoperative nausea and vomiting)    Tobacco abuse, in remission    10-pack-years; quit in 2001   Medications:  Scheduled:   acetaminophen   1,000 mg Oral TID   feeding supplement  237 mL Oral BID BM   heparin   5,000 Units Subcutaneous Q8H   lactulose   20 g Oral QHS   metoprolol  tartrate  25 mg Oral BID    midodrine   5 mg Oral TID WC   pantoprazole   40 mg Oral Daily   rosuvastatin   40 mg Oral Daily   sodium chloride  flush  3 mL Intravenous Q12H   Infusions:   sodium chloride  Stopped (01/21/24 2245)   sodium chloride  125 mL/hr at 01/22/24 1038   sodium chloride  Stopped (01/21/24 1900)   Assessment: Patient is 83 YO F presenting to OSH for fall and transferred to [facility] for cardiac cath. Patient with known afib on DOAC PTA. Last dose of apixaban  was in the AM of 9/11. Patient received IV heparin  9/11-9/12 in anticipation of cardiac cath which occurred on 9/12. Patient was subtherapeutic on rate of 600 units/h with aPTT of 48.   Now restarting anticoagulation with IV heparin  pending possibility of EP device placement; plan is for EP to evaluate patient Monday. Pharmacy has been consulted to dose IV heparin  in the meantime. Hgb stable and platelets WNL.   Will check aPTT and HL for initial monitoring given recent DOAC administration on 9/11.   Goal of Therapy:  Heparin  level 0.3-0.7 units/ml aPTT 66-102 seconds Monitor platelets by anticoagulation protocol: Yes   Plan:  Start heparin  infusion at 750 units/hr Check aPTT and heparin  level in 8h.  Monitor aPTT, heparin  level, CBC, signs/sx bleeding daily.   Maurilio Patten, PharmD PGY1 Pharmacy Resident [facility] 01/22/2024 12:58 PM

## 2024-01-23 ENCOUNTER — Inpatient Hospital Stay (HOSPITAL_COMMUNITY)

## 2024-01-23 DIAGNOSIS — I951 Orthostatic hypotension: Secondary | ICD-10-CM | POA: Diagnosis not present

## 2024-01-23 LAB — CBC
HCT: 24.8 % — ABNORMAL LOW (ref 36.0–46.0)
Hemoglobin: 8 g/dL — ABNORMAL LOW (ref 12.0–15.0)
MCH: 32.8 pg (ref 26.0–34.0)
MCHC: 32.3 g/dL (ref 30.0–36.0)
MCV: 101.6 fL — ABNORMAL HIGH (ref 80.0–100.0)
Platelets: 222 K/uL (ref 150–400)
RBC: 2.44 MIL/uL — ABNORMAL LOW (ref 3.87–5.11)
RDW: 12.5 % (ref 11.5–15.5)
WBC: 18.2 K/uL — ABNORMAL HIGH (ref 4.0–10.5)
nRBC: 0 % (ref 0.0–0.2)

## 2024-01-23 LAB — BASIC METABOLIC PANEL WITH GFR
Anion gap: 10 (ref 5–15)
BUN: 30 mg/dL — ABNORMAL HIGH (ref 8–23)
CO2: 17 mmol/L — ABNORMAL LOW (ref 22–32)
Calcium: 7.2 mg/dL — ABNORMAL LOW (ref 8.9–10.3)
Chloride: 103 mmol/L (ref 98–111)
Creatinine, Ser: 1.47 mg/dL — ABNORMAL HIGH (ref 0.44–1.00)
GFR, Estimated: 36 mL/min — ABNORMAL LOW (ref >60)
Glucose, Bld: 121 mg/dL — ABNORMAL HIGH (ref 70–99)
Potassium: 3.7 mmol/L (ref 3.5–5.1)
Sodium: 130 mmol/L — ABNORMAL LOW (ref 135–145)

## 2024-01-23 LAB — APTT: aPTT: 72 s — ABNORMAL HIGH (ref 24–36)

## 2024-01-23 LAB — HEPARIN LEVEL (UNFRACTIONATED): Heparin Unfractionated: >1.1 [IU]/mL — ABNORMAL HIGH (ref 0.30–0.70)

## 2024-01-23 MED ORDER — QUETIAPINE FUMARATE 25 MG PO TABS
25.0000 mg | ORAL_TABLET | ORAL | Status: DC
  Administered 2024-01-23 – 2024-01-26 (×4): 25 mg via ORAL
  Filled 2024-01-23 (×4): qty 1

## 2024-01-23 MED ORDER — ACETAMINOPHEN 325 MG PO TABS
650.0000 mg | ORAL_TABLET | Freq: Four times a day (QID) | ORAL | Status: DC | PRN
  Administered 2024-01-23 – 2024-01-24 (×3): 650 mg via ORAL
  Filled 2024-01-23 (×4): qty 2

## 2024-01-23 MED ORDER — SODIUM CHLORIDE 0.9 % IV SOLN: INTRAVENOUS | Status: AC

## 2024-01-23 NOTE — Progress Notes (Signed)
 Ice pack applied at 1220 to facial injury Ice pack removed at 1245

## 2024-01-23 NOTE — Progress Notes (Signed)
 Mobility Specialist Progress Note:   01/23/24 1100  Mobility  Activity Pivoted/transferred to/from [facility];Pivoted/transferred from bed to chair  Level of Assistance Minimal assist, patient does 75% or more  Assistive Device BSC;Other (Comment) (HHA)  Distance Ambulated (ft) 4 ft  Activity Response Tolerated fair  Mobility Referral Yes  Mobility visit 1 Mobility  Mobility Specialist Start Time (ACUTE ONLY) 1050  Mobility Specialist Stop Time (ACUTE ONLY) 1103  Mobility Specialist Time Calculation (min) (ACUTE ONLY) 13 min   Pt requesting to transfer to [facility] for BM. BM successful. Able to stand with light minA via HHA. Pain medicine already given by RN, however pt not remembering, resulting in her asking for medicine repeatedly. Continues to c/o significant back pain. Agreed to transfer back to bed, left with all needs met.  [name] Mobility Specialist Please contact via SecureChat or  Rehab office at [phone]

## 2024-01-23 NOTE — Progress Notes (Signed)
   Rounding Note    Patient Name: [name] Date of Encounter: 01/23/2024  [facility] Cardiologist: Alvan Carrier, MD   Subjective   Resting, NAEO  Inpatient Medications    Scheduled Meds:  feeding supplement  237 mL Oral BID BM   lactulose   20 g Oral QHS   metoprolol  tartrate  25 mg Oral BID   midodrine   5 mg Oral TID WC   pantoprazole   40 mg Oral Daily   rosuvastatin   40 mg Oral Daily   sodium chloride  flush  3 mL Intravenous Q12H   Continuous Infusions:  heparin  850 Units/hr (01/22/24 2238)   sodium chloride  Stopped (01/21/24 1900)   PRN Meds: acetaminophen , HYDROcodone -acetaminophen , methocarbamol , prochlorperazine , sodium chloride  flush   Vital Signs    Vitals:   01/22/24 2317 01/23/24 0412 01/23/24 0751 01/23/24 0945  BP: (!) 140/42 (!) 157/53 (!) 162/59 (!) 164/100  Pulse: 74 74 80 80  Resp: 18 16 16    Temp: (!) 97.5 F (36.4 C) 98.1 F (36.7 C) 97.9 F (36.6 C)   TempSrc: Oral Oral Oral   SpO2: 99% 95% 98%   Weight:      Height:        Intake/Output Summary (Last 24 hours) at 01/23/2024 1058 Last data filed at 01/23/2024 0400 Gross per 24 hour  Intake 2495.01 ml  Output 950 ml  Net 1545.01 ml      01/21/2024    8:52 AM 01/20/2024   10:23 PM 01/15/2024   10:10 PM  Last 3 Weights  Weight (lbs) 112 lb 10.5 oz 112 lb 10.5 oz 105 lb 2.6 oz  Weight (kg) 51.1 kg 51.1 kg 47.7 kg      Telemetry    Sinus with occasional PAC - Personally Reviewed  Physical Exam   GEN: No acute distress.  Mass under L eye Neck: No JVD Cardiac: RRR, no murmurs, rubs, or gallops.  Respiratory: Clear to auscultation bilaterally. GI: Soft, nontender, non-distended  MS: No edema; No deformity. Neuro:  Nonfocal  Psych: Normal affect   New pertinent results (labs, ECG, imaging, cardiac studies)    Echo personally reviewed, no significant change Cath with no disease.   Assessment & Plan    Wide complex tachycardia Recurrent  syncope/falls -flecainide  discontinued -echo unremarkable with regard to [facility] -normal coronary arteries on cath.  -last dose of apixaban  9/11 AM, will continue IV heparin  for afib in the event EP device indicated. Will have EP see Monday.  -has not had any events on telemetry here - metoprolol  25 mg BID - plan for cardiac MRI Monday to evaluate for myocardial scar.   Paroxysmal atrial fibrillation -apixaban  on hold as above, would continue IV heparin . -flecainide  stopped with WCT/syncope as above -has been sinus with occasional PAC since arriving here -may have had some brief AF this am.   Orthostatic hypotension Falls -chronic, on midodrine  -PT/OT  Acute kidney injury Hyponatremia - sodium stable - cr improved with fluids.   Chronic anema -Hgb 8 today, range has been 8.3-11.4 since admission (was dehydrated on presentation, when Hgb was 11.4). per primary. No signs of bleeding.     Signed, Soyla DELENA Merck, MD  01/23/2024, 10:58 AM

## 2024-01-23 NOTE — Progress Notes (Signed)
 Ice pack applied to facial injury at 1000 Ice pack removed at 1020

## 2024-01-23 NOTE — Progress Notes (Signed)
 PROGRESS NOTE    [name]  FMW:[id number] DOB: Jun 03, 1940 DOA: 01/15/2024 PCP: Alphonsa Glendia LABOR, MD   Brief Narrative:  [name] is a 83 y.o. female with medical history significant of PAF on anticoagulation and flecainide , ostoporosis, hyperlipidemia, chronic pain on opiates, long hystory of orthostasis and has been on midodrine  intermittently over the last year presented with fall.  She was admitted to hospitalist service and [facility] for evaluation of fall due to orthostasis.  Due to ventricular tachycardia, seen by cardiology there, they recommended cardiac cath, patient transferred to [facility] for that.  Assessment & Plan:   Principal Problem:   Orthostasis Active Problems:   Paroxysmal atrial fibrillation (HCC)   Osteoporosis   Hyponatremia   Leukocytosis  Orthostasis S/p Fall, head trauma- -Long h/o orthostasis, complicated by dehydration.  She is on midodrine  5 mg 3 times daily PTA.  However her blood pressure is rising now, with systolic around 160s.  Placed holding parameters to midodrine , holding now. - 2D echo demonstrating preserved ejection fraction, no wall motion normalities and no significant valvular disorder. - Continue as needed analgesics.   Ventricular tachycardia- Given ongoing changes and intermittent no sustained episodes of ventricular tachycardia decision has been made to proceed with cardiac cath for further evaluation.  Status postcardiac cath 01/21/2024 with normal coronary arteries.  Cardiology plans to get cardiac MRI on Monday.   Paroxysmal atrial fibrillation - On Eliquis  PTA, transitioned to heparin  for now per cardiology.  Rates controlled.   AKI on CKD stage IIIa/dehydration: Patient's creatinine varies and ranges anywhere from 0.8-1.2.  It peaked at 1.67 day before yesterday and down to 1.47 today.  I still think this is due to dehydration as patient is often weak, lethargic due to recent falls, overall deconditioning and due to being  on opioids.  Will resume IV fluids aggressively today with normal saline at 150 cc/h for next 24 hours.  Repeat labs in the morning.     Hyponatremia In the setting of dehydration, likely hypovolemic, improved to 130 from 127 yesterday and has remained stable compared to yesterday.  Chronic back pain: Resume her home dose of hydrocodone  medications which she takes every 6 hours as needed.  She still complains of back pain however she did not cry last night for extra IV pain medications.  She was lethargic this morning.  Nurse was at the bedside as well.  Discussed with the patient as well as the nurse about my concern about her lethargy which is likely secondary to opioids, we will absolutely not escalate pain medications anymore.   Leukocytosis Reactive/stress demargination.  Slightly worse than yesterday but still no signs or symptoms of infection and she is afebrile.  Daughter was concerned about this yesterday so I am just repeating the chest x-ray to make sure she is not developing pneumonia.  UTI has been ruled out already.  Large bruise under left eye: This happened a week ago due to fall.  Nurses have been advised to apply cool compresses every 2-3 hours.   Debility- Generalized weakness- -Due to poor oral intake. -Lives alone, seen by PT OT, planning for home health services at discharge. -Patient has been encouraged to continue to participate with mobility specialist for out of bed activities and to sit couple hours at a time on the chair.  DVT prophylaxis: Heparin    Code Status: Full Code  Family Communication:  None present at bedside.  Long discussion with the daughter-in-law yesterday.  Status is:  Inpatient Remains inpatient appropriate because: Cardiology plans cardiac MRI on Monday and EP to see on Monday as well     Estimated body mass index is 20.6 kg/m as calculated from the following:   Height as of this encounter: 5' 2 (1.575 m).   Weight as of this encounter:  51.1 kg.    Nutritional Assessment: Body mass index is 20.6 kg/m.SABRA Seen by dietician.  I agree with the assessment and plan as outlined below: Nutrition Status:        . Skin Assessment: I have examined the patient's skin and I agree with the wound assessment as performed by the wound care RN as outlined below:    Consultants:  Cardiology  Procedures:  Above  Antimicrobials:  Anti-infectives (From admission, onward)    None         Subjective: Patient seen and examined.  Lethargic today.  Complains of low back pain.  No other complaint.  Discontinuing Ativan .  Will start her on Seroquel  tonight so she can have a good sleep.  Objective: Vitals:   01/23/24 0945 01/23/24 0947 01/23/24 1045 01/23/24 1149  BP: (!) 164/100 (!) 164/100 (!) 148/73 (!) 147/56  Pulse: 80 80  88  Resp: 18     Temp:      TempSrc:      SpO2: 95% 95%    Weight:      Height:        Intake/Output Summary (Last 24 hours) at 01/23/2024 1153 Last data filed at 01/23/2024 0945 Gross per 24 hour  Intake 2795.01 ml  Output 950 ml  Net 1845.01 ml   Filed Weights   01/15/24 2210 01/20/24 2223 01/21/24 0852  Weight: 47.7 kg 51.1 kg 51.1 kg    Examination: General exam: Appears lethargic, improves/pump on the left facial area is improving since cold packs were started yesterday. Respiratory system: Clear to auscultation. Respiratory effort normal. Cardiovascular system: S1 & S2 heard, RRR. No JVD, murmurs, rubs, gallops or clicks. No pedal edema. Gastrointestinal system: Abdomen is nondistended, soft and nontender. No organomegaly or masses felt. Normal bowel sounds heard. Central nervous system: Lethargic but still conversive eating and oriented.  No focal deficit, following commands.  Data Reviewed: I have personally reviewed following labs and imaging studies  CBC: Recent Labs  Lab 01/21/24 0430 01/21/24 0652 01/21/24 1455 01/21/24 1504 01/21/24 1607 01/22/24 0536 01/23/24 0531   WBC 17.1* 16.6*  --   --  19.7* 16.4* 18.2*  HGB 9.5* 8.4* 8.8* 8.5* 9.2* 9.2* 8.0*  HCT 28.8* 25.3* 26.0* 25.0* 27.2* 28.4* 24.8*  MCV 101.4* 99.6  --   --  100.4* 102.5* 101.6*  PLT 250 221  --   --  229 264 222   Basic Metabolic Panel: Recent Labs  Lab 01/18/24 0258 01/18/24 0740 01/19/24 0443 01/20/24 0541 01/21/24 0430 01/21/24 0652 01/21/24 1455 01/21/24 1504 01/21/24 1607 01/22/24 0536 01/23/24 0531  NA 132*  --  131* 130* 130* 127* 126* 127*  --  130* 130*  K 3.8  --  4.3 4.4 4.6 4.3 4.7 4.4  --  4.8 3.7  CL 101  --  101 96* 94* 96*  --   --   --  95* 103  CO2 22  --  22 23 21* 21*  --   --   --  20* 17*  GLUCOSE 126*  --  122* 131* 113* 111*  --   --   --  93 121*  BUN 22  --  22 22 29* 29*  --   --   --  33* 30*  CREATININE 1.02*  --  1.07* 1.10* 1.66* 1.59*  --   --  1.67* 1.61* 1.47*  CALCIUM  8.7*  --  8.0* 7.9* 8.1* 7.7*  --   --   --  7.9* 7.2*  MG 1.5*  --  1.9 1.9 1.9  --   --   --   --   --   --   PHOS  --  1.9* 3.4 3.3 2.9  --   --   --   --   --   --    GFR: Estimated Creatinine Clearance: 24.1 mL/min (A) (by C-G formula based on SCr of 1.47 mg/dL (H)). Liver Function Tests: No results for input(s): AST, ALT, ALKPHOS, BILITOT, PROT, ALBUMIN in the last 168 hours.  No results for input(s): LIPASE, AMYLASE in the last 168 hours. No results for input(s): AMMONIA in the last 168 hours. Coagulation Profile: No results for input(s): INR, PROTIME in the last 168 hours. Cardiac Enzymes: No results for input(s): CKTOTAL, CKMB, CKMBINDEX, TROPONINI in the last 168 hours. BNP (last 3 results) No results for input(s): PROBNP in the last 8760 hours. HbA1C: No results for input(s): HGBA1C in the last 72 hours. CBG: No results for input(s): GLUCAP in the last 168 hours. Lipid Profile: No results for input(s): CHOL, HDL, LDLCALC, TRIG, CHOLHDL, LDLDIRECT in the last 72 hours. Thyroid  Function Tests: No results for  input(s): TSH, T4TOTAL, FREET4, T3FREE, THYROIDAB in the last 72 hours.  Anemia Panel: No results for input(s): VITAMINB12, FOLATE, FERRITIN, TIBC, IRON, RETICCTPCT in the last 72 hours. Sepsis Labs: No results for input(s): PROCALCITON, LATICACIDVEN in the last 168 hours.  No results found for this or any previous visit (from the past 240 hours).   Radiology Studies: CARDIAC CATHETERIZATION Result Date: 01/22/2024 Table formatting from the original result was not included. Images from the original result were not included. Dominance: Right -> Angiographically Normal Coronary Arteries.  Normal LVEDP.  Nonischemic etiology for VT RECOMMENDATIONS   Anticipated discharge date to be determined.   Recommend to resume Apixaban , at currently prescribed dose and frequency on 01/22/2024.   Could reinitiate Apixaban  as early as a.m. of 01/22/2024 versus placed back on IV heparin  pending further evaluation. Alm Clay, MD    Scheduled Meds:  feeding supplement  237 mL Oral BID BM   lactulose   20 g Oral QHS   metoprolol  tartrate  25 mg Oral BID   midodrine   5 mg Oral TID WC   pantoprazole   40 mg Oral Daily   rosuvastatin   40 mg Oral Daily   sodium chloride  flush  3 mL Intravenous Q12H   Continuous Infusions:  sodium chloride      heparin  850 Units/hr (01/22/24 2238)   sodium chloride  Stopped (01/21/24 1900)     LOS: 2 days   Fredia Skeeter, MD [facility]  01/23/2024, 11:53 AM   *Please note that this is a verbal dictation therefore any spelling or grammatical errors are due to the Dragon Medical One system interpretation.  Please page via Amion and do not message via secure chat for urgent patient care matters. Secure chat can be used for non urgent patient care matters.  How to contact the [facility] Attending or Consulting provider 7A - 7P or covering provider during after hours 7P -7A, for this patient?  Check the care team in [facility] and look for a)  attending/consulting [facility] provider listed  and b) the [facility] team listed. Page or secure chat 7A-7P. Log into [email or web address] and use [facility]'s universal password to access. If you do not have the password, please contact the hospital operator. Locate the [facility] provider you are looking for under [facility] and page to a number that you can be directly reached. If you still have difficulty reaching the provider, please page the [facility] (Director on Call) for the Hospitalists listed on amion for assistance.

## 2024-01-23 NOTE — Progress Notes (Signed)
 Patient has been encouraged to eat lunch at multiple different times. RN offered to assist patient with lunch. Patient refused multiple times to eat lunch, stating she don't want too and I took a few bites and drank the pepsi. Education shared with patient on the importance of nutritional intake.   This morning the patient ate 50% of oatmeal and consumed one Nepro supplement drink.

## 2024-01-23 NOTE — Progress Notes (Signed)
 PHARMACY - ANTICOAGULATION CONSULT NOTE  Pharmacy Consult for IV heparin  Indication: atrial fibrillation  Allergies  Allergen Reactions   Flecainide  Other (See Comments)    Rate related aberrancy versus slow VT, asymptomatic   Patient Measurements: Height: 5' 2 (157.5 cm) Weight: 51.1 kg (112 lb 10.5 oz) IBW/kg (Calculated) : 50.1 HEPARIN  DW (KG): 51.1  Vital Signs: Temp: 97.9 F (36.6 C) (09/14 0751) Temp Source: Oral (09/14 0751) BP: 162/59 (09/14 0751) Pulse Rate: 80 (09/14 0751)  Labs: Recent Labs    01/21/24 0430 01/21/24 9347 01/21/24 1607 01/22/24 0536 01/22/24 2115 01/23/24 0531 01/23/24 0817  HGB 9.5*   < > 9.2* 9.2*  --  8.0*  --   HCT 28.8*   < > 27.2* 28.4*  --  24.8*  --   PLT 250   < > 229 264  --  222  --   APTT 48*  --   --  32 60*  --  72*  HEPARINUNFRC >1.10*  --   --   --  >1.10*  --  >1.10*  CREATININE 1.66*   < > 1.67* 1.61*  --  1.47*  --    < > = values in this interval not displayed.    Estimated Creatinine Clearance: 24.1 mL/min (A) (by C-G formula based on SCr of 1.47 mg/dL (H)).   Medical History: Past Medical History:  Diagnosis Date   Adenocarcinoma, colon (HCC) 03/2005   Stage I; resected in 03/2005   Arthritis    Colon cancer [facility])    Removed surgicaly    Dysrhythmia    Gastroesophageal reflux disease    Gastroparesis    Hearing loss    mild   Hyperlipidemia    Irregular heartbeat    Ovarian cyst 2008   Pain in joint of right shoulder 06/29/2017   Pain in joint, ankle and foot 03/13/2014   Paroxysmal atrial fibrillation (HCC)    Palpitations; maintained on flecainide ; -normal coronary angiography and 1999; negative stress nuclear study in 11/2005   PONV (postoperative nausea and vomiting)    Tobacco abuse, in remission    10-pack-years; quit in 2001   Medications:  Scheduled:   acetaminophen   1,000 mg Oral TID   feeding supplement  237 mL Oral BID BM   lactulose   20 g Oral QHS   metoprolol  tartrate  25 mg Oral  BID   midodrine   5 mg Oral TID WC   pantoprazole   40 mg Oral Daily   rosuvastatin   40 mg Oral Daily   sodium chloride  flush  3 mL Intravenous Q12H   Infusions:   heparin  850 Units/hr (01/22/24 2238)   sodium chloride  Stopped (01/21/24 1900)   Assessment: Patient is 83 YO F presenting to OSH for fall and transferred to [facility] for cardiac cath. Patient with known afib on DOAC PTA. Last dose of apixaban  was in the AM of 9/11.   Anticoagulation with IV heparin  started with possibility of EP device placement; planning is for EP to evaluate patient Monday. Pharmacy has been consulted to dose IV heparin  in the meantime. Hgb at 8.0 from 9.2 and platelets WNL.   AM HL remains elevated in setting of recent DOAC administration. AM aPTT therapeutic at 72 seconds on infusion of 850 units/h. No issues with heparin  infusion or s/sx bleeding reported. Continue to monitor with aPTT given HL not yet correlating.   Goal of Therapy:  Heparin  level 0.3-0.7 units/ml aPTT 66-102 seconds Monitor platelets by anticoagulation protocol: Yes  Plan:  Continue IV heparin  at 850 units/hr. Monitor aPTT, heparin  level, CBC, signs/sx bleeding daily.   Maurilio Patten, PharmD PGY1 Pharmacy Resident [facility] 01/23/2024 9:29 AM

## 2024-01-23 NOTE — Progress Notes (Signed)
 Patient continues to have poor appetite.  Consumption: Breakfast = 50% Lunch = 0% Dinner = 25%  Patient consumed 1 Ensure protein supplement drink, and 1 Nepro protein supplement drink (because she wanted strawberry flavor).

## 2024-01-23 NOTE — Progress Notes (Signed)
 Around 1800, RN attempted to place ice pack on patient's facial injury. RN unable to at this time. Patient is OOB to chair and eating dinner.

## 2024-01-23 NOTE — Progress Notes (Signed)
 Per family request, only family members are permitted to visit the patient. Patient agrees to this. Patients chart made confidential. A password has been established with family members and security.

## 2024-01-23 NOTE — Progress Notes (Signed)
 Mobility Specialist Progress Note:   01/23/24 0930  Orthostatic Lying   BP- Lying 146/71  Orthostatic Sitting  BP- Sitting 162/88  Orthostatic Standing at 0 minutes  BP- Standing at 0 minutes 149/84  Orthostatic Standing at 3 minutes  BP- Standing at 3 minutes  (pt unable to stand for 3 min d/t back pain)  Mobility  Activity Pivoted/transferred from bed to chair  Level of Assistance Minimal assist, patient does 75% or more  Assistive Device Other (Comment) (HHA)  Distance Ambulated (ft) 3 ft  Activity Response Tolerated fair  Mobility Referral Yes  Mobility visit 1 Mobility  Mobility Specialist Start Time (ACUTE ONLY) 0930  Mobility Specialist Stop Time (ACUTE ONLY) 0945  Mobility Specialist Time Calculation (min) (ACUTE ONLY) 15 min   Pt agreeable to mobility session. Required modA for bed mobility, minA to stand from EOB and pivot to chair. Pt c/o significant back pain, RN notified and in to give meds.   [name] Mobility Specialist Please contact via SecureChat or  Rehab office at [phone]

## 2024-01-23 NOTE — Progress Notes (Signed)
 Ice pack applied to facial injury at 1510 Ice pack removed at 1530

## 2024-01-24 ENCOUNTER — Inpatient Hospital Stay (HOSPITAL_COMMUNITY)

## 2024-01-24 DIAGNOSIS — I472 Ventricular tachycardia, unspecified: Secondary | ICD-10-CM

## 2024-01-24 DIAGNOSIS — I480 Paroxysmal atrial fibrillation: Secondary | ICD-10-CM | POA: Diagnosis not present

## 2024-01-24 DIAGNOSIS — I951 Orthostatic hypotension: Secondary | ICD-10-CM | POA: Diagnosis not present

## 2024-01-24 LAB — CBC
HCT: 22.9 % — ABNORMAL LOW (ref 36.0–46.0)
Hemoglobin: 7.5 g/dL — ABNORMAL LOW (ref 12.0–15.0)
MCH: 33.2 pg (ref 26.0–34.0)
MCHC: 32.8 g/dL (ref 30.0–36.0)
MCV: 101.3 fL — ABNORMAL HIGH (ref 80.0–100.0)
Platelets: 244 K/uL (ref 150–400)
RBC: 2.26 MIL/uL — ABNORMAL LOW (ref 3.87–5.11)
RDW: 12.9 % (ref 11.5–15.5)
WBC: 17.2 K/uL — ABNORMAL HIGH (ref 4.0–10.5)
nRBC: 0 % (ref 0.0–0.2)

## 2024-01-24 LAB — BASIC METABOLIC PANEL WITH GFR
Anion gap: 12 (ref 5–15)
BUN: 22 mg/dL (ref 8–23)
CO2: 17 mmol/L — ABNORMAL LOW (ref 22–32)
Calcium: 7.5 mg/dL — ABNORMAL LOW (ref 8.9–10.3)
Chloride: 111 mmol/L (ref 98–111)
Creatinine, Ser: 1.23 mg/dL — ABNORMAL HIGH (ref 0.44–1.00)
GFR, Estimated: 44 mL/min — ABNORMAL LOW (ref >60)
Glucose, Bld: 117 mg/dL — ABNORMAL HIGH (ref 70–99)
Potassium: 3.6 mmol/L (ref 3.5–5.1)
Sodium: 140 mmol/L (ref 135–145)

## 2024-01-24 LAB — APTT: aPTT: 82 s — ABNORMAL HIGH (ref 24–36)

## 2024-01-24 LAB — HEPARIN LEVEL (UNFRACTIONATED): Heparin Unfractionated: 0.92 [IU]/mL — ABNORMAL HIGH (ref 0.30–0.70)

## 2024-01-24 MED ORDER — AMIODARONE HCL 200 MG PO TABS
200.0000 mg | ORAL_TABLET | Freq: Two times a day (BID) | ORAL | Status: DC
  Administered 2024-01-24 – 2024-01-27 (×6): 200 mg via ORAL
  Filled 2024-01-24 (×6): qty 1

## 2024-01-24 MED ORDER — FUROSEMIDE 10 MG/ML IJ SOLN
20.0000 mg | INTRAMUSCULAR | Status: AC
  Administered 2024-01-24: 20 mg via INTRAVENOUS
  Filled 2024-01-24: qty 2

## 2024-01-24 MED ORDER — SODIUM CHLORIDE 0.9 % IV SOLN: INTRAVENOUS | Status: AC

## 2024-01-24 MED ORDER — APIXABAN 2.5 MG PO TABS
2.5000 mg | ORAL_TABLET | Freq: Two times a day (BID) | ORAL | Status: DC
  Administered 2024-01-24 – 2024-01-27 (×6): 2.5 mg via ORAL
  Filled 2024-01-24 (×6): qty 1

## 2024-01-24 MED ORDER — GADOBUTROL 1 MMOL/ML IV SOLN
5.0000 mL | Freq: Once | INTRAVENOUS | Status: AC | PRN
  Administered 2024-01-24: 5 mL via INTRAVENOUS

## 2024-01-24 MED ORDER — LEVALBUTEROL HCL 0.63 MG/3ML IN NEBU
0.6300 mg | INHALATION_SOLUTION | Freq: Four times a day (QID) | RESPIRATORY_TRACT | Status: DC | PRN
  Filled 2024-01-24: qty 3

## 2024-01-24 MED ORDER — CEFAZOLIN SODIUM-DEXTROSE 1-4 GM/50ML-% IV SOLN
1.0000 g | Freq: Three times a day (TID) | INTRAVENOUS | Status: DC
  Filled 2024-01-24: qty 50

## 2024-01-24 MED ORDER — CEFAZOLIN SODIUM-DEXTROSE 1-4 GM/50ML-% IV SOLN
1.0000 g | Freq: Three times a day (TID) | INTRAVENOUS | Status: DC
  Administered 2024-01-24: 1 g via INTRAVENOUS
  Filled 2024-01-24: qty 50

## 2024-01-24 MED ORDER — ENSURE PLUS HIGH PROTEIN PO LIQD
237.0000 mL | Freq: Three times a day (TID) | ORAL | Status: DC
Start: 2024-01-24 — End: 2024-01-27
  Administered 2024-01-24 – 2024-01-27 (×6): 237 mL via ORAL

## 2024-01-24 MED ORDER — LEVALBUTEROL HCL 0.63 MG/3ML IN NEBU
0.6300 mg | INHALATION_SOLUTION | Freq: Four times a day (QID) | RESPIRATORY_TRACT | Status: DC | PRN
  Administered 2024-01-24: 0.63 mg via RESPIRATORY_TRACT

## 2024-01-24 NOTE — Progress Notes (Signed)
 PT Cancellation Note  Patient Details Name: [name] MRN: [id number] DOB: 1940-10-16   Cancelled Treatment:    Reason Eval/Treat Not Completed: Patient at procedure or test/unavailable, pt off unit for cardiac MRI, will continue to follow progress as time/schedule allows.   [name], PT, DPT   Acute Rehabilitation Department Office [phone] Secure Chat Communication Preferred   [name] 01/24/2024, 10:11 AM

## 2024-01-24 NOTE — Consult Note (Signed)
 Cardiology Consultation   Patient ID: [name] MRN: [id number]; DOB: 03/11/1941  Admit date: 01/15/2024 Date of Consult: 01/24/2024  PCP:  Alphonsa Glendia LABOR, MD   [facility] Providers Cardiologist:  Alvan Carrier, MD     Patient Profile: [name] is a 83 y.o. female with a hx of  Orthostatic hypotension/dizziness chronically on midodrine  AFib  who is being seen 01/24/2024 for the evaluation of WCT at the request of Dr. Loni.  History of Present Illness: Ms. Murley last aw Dr. Alvan in the office June 2025, discussed known hx of orthostatic dizziness/hypotension with + orthostatic vitals at that visit (better since midodrine  and compression stockings) urged better hydration. Also reported palpitations > maintained on flecainide  and Eliquis   (A few weeks of reported low BPs, 01/03/24 > advised to increase her midodrine  dose and minimize pain med as much as possible> though did not until ~ 2 days prior to this admission)  Admitted 01/15/24 to [facility] with syncope Though H&P reports no full syncope, + trauma, and felt was 2/2 orthostasis. Reports of poor oral intake >> IVF 9/9 developed sustained WCT ~ 3 hours (no EKG), pt minimally if at all symptomatic, rate 120's reported K 3.8, magnesium  1.5 replaced Pt reported severe MSK pain (chronic) and treated with morphine  Cardiology consulted, her home flecainide  stopped, suspect to be aberrancy  Consider EP out pt, rec d/c with a monitor Fall/syncope felt orthostatic 9/11 > developed recurrent NSWCTs > planned to transfer to [facility] for cath and further evaluation  TTE with LVEF 60-65%, no WMA LHC with no CAD C/MRI done (pending read)  EP engaged  LABS K+ 3.6 BUN/Creat 22/1.23 WBC 17.2  (July was 10.6, April was 11.5) H/H 7.5/22.9 Plts 244  Generally feeling weak No CP, no SOB  Past Medical History:  Diagnosis Date   Adenocarcinoma, colon (HCC) 03/2005   Stage I; resected in 03/2005   Arthritis    Colon  cancer (HCC)    Removed surgicaly    Dysrhythmia    Gastroesophageal reflux disease    Gastroparesis    Hearing loss    mild   Hyperlipidemia    Irregular heartbeat    Ovarian cyst 2008   Pain in joint of right shoulder 06/29/2017   Pain in joint, ankle and foot 03/13/2014   Paroxysmal atrial fibrillation (HCC)    Palpitations; maintained on flecainide ; -normal coronary angiography and 1999; negative stress nuclear study in 11/2005   PONV (postoperative nausea and vomiting)    Tobacco abuse, in remission    10-pack-years; quit in 2001    Past Surgical History:  Procedure Laterality Date   ABDOMINAL HYSTERECTOMY  1983   BACK SURGERY     BIOPSY  10/18/2017   Procedure: BIOPSY;  Surgeon: Golda Claudis PENNER, MD;  [facility];  Service: Endoscopy;;  gastric   BIOPSY  07/06/2018   Procedure: BIOPSY;  Surgeon: Golda Claudis PENNER, MD;  [facility];  Service: Endoscopy;;  gastric   CATARACT EXTRACTION W/PHACO Right 12/22/2021   Procedure: CATARACT EXTRACTION PHACO AND INTRAOCULAR LENS PLACEMENT (IOC);  Surgeon: Harrie Agent, MD;  Location: [facility];  Service: Ophthalmology;  Laterality: Right;  CDE: 13.86   CATARACT EXTRACTION W/PHACO Left 01/05/2022   Procedure: CATARACT EXTRACTION PHACO AND INTRAOCULAR LENS PLACEMENT (IOC);  Surgeon: Harrie Agent, MD;  Location: [facility];  Service: Ophthalmology;  Laterality: Left;  CDE 21.39   CHOLECYSTECTOMY     COLON SURGERY  2006   Colon  cancer   COLONOSCOPY  03/25/2012   Claudis RAYMOND Rivet, MD   COLONOSCOPY N/A 04/26/2017   Procedure: COLONOSCOPY;  Surgeon: Rivet Claudis RAYMOND, MD;  [facility];  Service: Endoscopy;  Laterality: N/A;  2:00   COLONOSCOPY N/A 07/06/2018   Procedure: COLONOSCOPY;  Surgeon: Rivet Claudis RAYMOND, MD;  [facility];  Service: Endoscopy;  Laterality: N/A;  10:30   COLONOSCOPY WITH PROPOFOL  N/A 09/10/2021   Procedure: COLONOSCOPY WITH PROPOFOL ;  Surgeon: Rivet Claudis RAYMOND, MD;  [facility];  Service: Endoscopy;  Laterality: N/A;  910   COSMETIC SURGERY  09/2007   ESOPHAGOGASTRODUODENOSCOPY N/A 10/18/2017   Procedure: ESOPHAGOGASTRODUODENOSCOPY (EGD);  Surgeon: Rivet Claudis RAYMOND, MD;  [facility];  Service: Endoscopy;  Laterality: N/A;   ESOPHAGOGASTRODUODENOSCOPY N/A 07/06/2018   Procedure: ESOPHAGOGASTRODUODENOSCOPY (EGD);  Surgeon: Rivet Claudis RAYMOND, MD;  [facility];  Service: Endoscopy;  Laterality: N/A;   GIVENS CAPSULE STUDY N/A 12/26/2019   Procedure: GIVENS CAPSULE STUDY;  Surgeon: Rivet Claudis RAYMOND, MD;  [facility];  Service: Endoscopy;  Laterality: N/A;  730   LEFT HEART CATH AND CORONARY ANGIOGRAPHY N/A 01/21/2024   Procedure: LEFT HEART CATH AND CORONARY ANGIOGRAPHY;  Surgeon: Anner Alm ORN, MD;  Location: [facility];  Service: Cardiovascular;  Laterality: N/A;   PARTIAL COLECTOMY  2006   adenocarcinoma   POLYPECTOMY  04/26/2017   Procedure: POLYPECTOMY;  Surgeon: Rivet Claudis RAYMOND, MD;  [facility];  Service: Endoscopy;;  colon   POLYPECTOMY  07/06/2018   Procedure: POLYPECTOMY;  Surgeon: Rivet Claudis RAYMOND, MD;  [facility];  Service: Endoscopy;;  colon    POLYPECTOMY  09/10/2021   Procedure: POLYPECTOMY;  Surgeon: Rivet Claudis RAYMOND, MD;  [facility];  Service: Endoscopy;;  transverse x2;sigmoid   TONSILLECTOMY       Home Medications:  Prior to Admission medications   Medication Sig Start Date End Date Taking? Authorizing Provider  alendronate  (FOSAMAX ) 70 MG tablet TAKE ONE TABLET (70 MG TOTAL) BY MOUTH EVERY 7 DAYS. TAKE WITH A FULL GLASS OF WATER  ON AN EMPTY STOMACH. Patient taking differently: Take 70 mg by mouth once a week. 09/03/23  Yes Alphonsa Glendia LABOR, MD  apixaban  (ELIQUIS ) 2.5 MG TABS tablet Take 1 tablet (2.5 mg total) by mouth 2 (two) times daily. 03/03/23  Yes BranchDorn FALCON, MD  Calcium  Carbonate (CALCIUM  600 PO) Take 600 mg by mouth daily.   Yes [provider]  CONSTULOSE  10 GM/15ML solution TAKE 30ML'S (20 GRAMS TOTAL) BY MOUTH ATBEDTIME Patient taking differently: Take 20 g by mouth at bedtime. 12/02/23  Yes Alphonsa Glendia LABOR, MD  ferrous sulfate 325 (65 FE) MG tablet Take 325 mg by mouth daily with breakfast.   Yes [provider]  flecainide  (TAMBOCOR ) 100 MG tablet TAKE ONE TABLET (100MG  TOTAL) BY MOUTH EVERY 12 HOURS Patient taking differently: Take 100 mg by mouth 2 (two) times daily. 08/27/23  Yes Branch, Dorn FALCON, MD  HYDROcodone -acetaminophen  (NORCO) 10-325 MG tablet One qid prn pain Patient taking differently: Take 1 tablet by mouth 4 (four) times daily as needed for moderate pain (pain score 4-6). 12/13/23  Yes Mauro Elveria BROCKS, NP  midodrine  (PROAMATINE ) 5 MG tablet Take 1 tablet (5 mg total) by mouth 3 (three) times daily with meals. 01/03/24  Yes BranchDorn FALCON, MD  omeprazole (PRILOSEC) 20 MG capsule Take 20 mg by mouth daily before breakfast.  Yes [provider]  rosuvastatin  (CRESTOR ) 40 MG tablet TAKE ONE TABLET (40MG  TOTAL) BY MOUTH DAILY Patient taking differently: Take 40 mg by mouth at bedtime. 03/19/23  Yes BranchDorn FALCON, MD  vitamin B-12 (CYANOCOBALAMIN ) 1000 MCG tablet Take 1,000 mcg by mouth daily.   Yes [provider]  HYDROcodone -acetaminophen  (NORCO) 10-325 MG tablet 1 taken 4 times daily as needed for pain Patient not taking: Reported on 01/16/2024 12/13/23   Mauro Elveria BROCKS, NP  HYDROcodone -acetaminophen  [facility]) 10-325 MG tablet One qid prn pain Patient not taking: Reported on 01/16/2024 12/13/23   Mauro Elveria BROCKS, NP    Scheduled Meds:  feeding supplement  237 mL Oral BID BM   lactulose   20 g Oral QHS   metoprolol  tartrate  25 mg Oral BID   midodrine   5 mg Oral TID WC   pantoprazole   40 mg Oral Daily   QUEtiapine   25 mg Oral Q24H   rosuvastatin   40 mg Oral Daily   sodium chloride  flush  3 mL Intravenous Q12H   Continuous Infusions:   ceFAZolin  (ANCEF ) IV     heparin  850  Units/hr (01/23/24 1847)   PRN Meds: acetaminophen , HYDROcodone -acetaminophen , methocarbamol , prochlorperazine , sodium chloride  flush  Allergies:    Allergies  Allergen Reactions   Flecainide  Other (See Comments)    Rate related aberrancy versus slow VT, asymptomatic    Social History:   Social History   Socioeconomic History   Marital status: Widowed    Spouse name: Not on file   Number of children: Not on file   Years of education: Not on file   Highest education level: Not on file  Occupational History   Occupation: Catering manager  Tobacco Use   Smoking status: Former    Current packs/day: 0.00    Average packs/day: 0.3 packs/day for 40.0 years (12.0 ttl pk-yrs)    Types: Cigarettes    Start date: 02/23/1963    Quit date: 03/25/2002    Years since quitting: 21.8   Smokeless tobacco: Never   Tobacco comments:    Quit smoking in 2001  Vaping Use   Vaping status: Never Used  Substance and Sexual Activity   Alcohol use: No    Alcohol/week: 0.0 standard drinks of alcohol   Drug use: No   Sexual activity: Not Currently    Birth control/protection: Surgical, Abstinence  Other Topics Concern   Not on file  Social History Narrative   10/02/22: husband passed away ~ 5 weeks ago      Lives with husband in a one story home.  Has 3 children.  Retired from Warehouse manager work.  Education: some college.   Social Drivers of Corporate investment banker Strain: Low Risk  (2022/10/02)   Overall Financial Resource Strain (CARDIA)    Difficulty of Paying Living Expenses: Not hard at all  Food Insecurity: No Food Insecurity (01/15/2024)   Hunger Vital Sign    Worried About Running Out of Food in the Last Year: Never true    Ran Out of Food in the Last Year: Never true  Transportation Needs: No Transportation Needs (01/15/2024)   PRAPARE - Administrator, Civil Service (Medical): No    Lack of Transportation (Non-Medical): No  Physical Activity: Sufficiently Active (10-02-22)    Exercise Vital Sign    Days of Exercise per Week: 7 days    Minutes of Exercise per Session: 30 min  Stress: No Stress Concern Present (2022-10-02)   [employer] of Occupational  Health - Occupational Stress Questionnaire    Feeling of Stress : Not at all  Social Connections: Moderately Isolated (01/15/2024)   Social Connection and Isolation Panel    Frequency of Communication with Friends and Family: More than three times a week    Frequency of Social Gatherings with Friends and Family: Twice a week    Attends Religious Services: More than 4 times per year    Active Member of [employer] or Organizations: No    Attends Banker Meetings: Never    Marital Status: Widowed  Intimate Partner Violence: Not At Risk (01/15/2024)   Humiliation, Afraid, Rape, and Kick questionnaire    Fear of Current or Ex-Partner: No    Emotionally Abused: No    Physically Abused: No    Sexually Abused: No    Family History:   Family History  Problem Relation Age of Onset   Heart disease Mother    Stroke Mother    Heart attack Mother    Cancer Father        lung   Heart disease Father    Hypertension Father    Cancer Brother        rectal   Healthy Son    Heart failure Sister    Stroke Sister      ROS:  Please see the history of present illness.  All other ROS reviewed and negative.     Physical Exam/Data: Vitals:   01/23/24 1533 01/23/24 2055 01/24/24 0026 01/24/24 0733  BP: (!) 148/83 (!) 143/50 124/61 (!) 142/62  Pulse: 81 (!) 52 77 72  Resp: (!) 22 16 16 18   Temp: 97.9 F (36.6 C) 98 F (36.7 C) 97.6 F (36.4 C) 98.3 F (36.8 C)  TempSrc: Oral Oral Oral Oral  SpO2: 99% 95% 94% 99%  Weight:      Height:        Intake/Output Summary (Last 24 hours) at 01/24/2024 1048 Last data filed at 01/24/2024 0458 Gross per 24 hour  Intake 2477.41 ml  Output 1450 ml  Net 1027.41 ml      01/21/2024    8:52 AM 01/20/2024   10:23 PM 01/15/2024   10:10 PM  Last 3 Weights  Weight  (lbs) 112 lb 10.5 oz 112 lb 10.5 oz 105 lb 2.6 oz  Weight (kg) 51.1 kg 51.1 kg 47.7 kg     Body mass index is 20.6 kg/m.  General:  thin, chronically  ill appearing HEENT: bandage to L cheek/orbit Neck: echymosis to neck, certal/left no JVD Vascular: No carotid bruits; Distal pulses 2+ bilaterally Cardiac:  RRR; no murmurs, gallops oor rubs Lungs: CTA b/l, no wheezing, rhonchi or rales  Abd: soft, nontender Ext: no edema Musculoskeletal:  No deformities. Advanced atrophy Skin: warm and dry  Neuro:  no focal abnormalities noted Psych:  Normal affect   EKG:  The EKG was personally reviewed and demonstrates:   SR 80bpm, baseline artifact SR 72 SR 80bpm SR 87bpm  Telemetry:  Telemetry was personally reviewed and demonstrates:   SR 60's with some brief PAFib  Relevant CV Studies:  01/24/24: c.MRI IMPRESSION: 1. The left ventricle is normal in cavity size and wall thickness. Systolic function was normal with LVEF 59%. No regional wall motion abnormalities.   2. The right ventricle is normal in cavity size and systolic function.   3. No significant valvular heart disease.   4. Delayed enhancement imaging demonstrates no evidence of myocardial infarction, inflammation, or infiltrative disorder.  5. Mild bilateral pleural effusions noted.    01/21/24: LHC Dominance: Right -> Angiographically Normal Coronary Arteries.  Normal LVEDP.   01/19/24: TTE 1. Left ventricular ejection fraction, by estimation, is 60 to 65%. The  left ventricle has normal function. The left ventricle has no regional  wall motion abnormalities. Left ventricular diastolic parameters were  normal.   2. Right ventricular systolic function is normal. The right ventricular  size is normal. There is normal pulmonary artery systolic pressure. The  estimated right ventricular systolic pressure is 33.2 mmHg.   3. A small pericardial effusion is present. The pericardial effusion is  posterior to the left  ventricle.   4. The mitral valve is grossly normal. Mild mitral valve regurgitation.   5. Tricuspid valve regurgitation is moderate.   6. The aortic valve has an indeterminant number of cusps, cannot exclude  functionally bicuspid valve. Aortic valve regurgitation is not visualized.  No aortic stenosis is present. Aortic valve mean gradient measures 7.0  mmHg. Calification noted within  LVOT and annulus.   7. The inferior vena cava is normal in size with greater than 50%  respiratory variability, suggesting right atrial pressure of 3 mmHg.   Laboratory Data: High Sensitivity Troponin:   Recent Labs  Lab 01/18/24 1158  TROPONINIHS 84*     Chemistry Recent Labs  Lab 01/19/24 0443 01/20/24 0541 01/21/24 0430 01/21/24 0652 01/22/24 0536 01/23/24 0531 01/24/24 0552  NA 131* 130* 130*   < > 130* 130* 140  K 4.3 4.4 4.6   < > 4.8 3.7 3.6  CL 101 96* 94*   < > 95* 103 111  CO2 22 23 21*   < > 20* 17* 17*  GLUCOSE 122* 131* 113*   < > 93 121* 117*  BUN 22 22 29*   < > 33* 30* 22  CREATININE 1.07* 1.10* 1.66*   < > 1.61* 1.47* 1.23*  CALCIUM  8.0* 7.9* 8.1*   < > 7.9* 7.2* 7.5*  MG 1.9 1.9 1.9  --   --   --   --   GFRNONAA 53* 51* 31*   < > 32* 36* 44*  ANIONGAP 8 11 15    < > 15 10 12    < > = values in this interval not displayed.    No results for input(s): PROT, ALBUMIN, AST, ALT, ALKPHOS, BILITOT in the last 168 hours. Lipids No results for input(s): CHOL, TRIG, HDL, LABVLDL, LDLCALC, CHOLHDL in the last 168 hours.  Hematology Recent Labs  Lab 01/22/24 0536 01/23/24 0531 01/24/24 0552  WBC 16.4* 18.2* 17.2*  RBC 2.77* 2.44* 2.26*  HGB 9.2* 8.0* 7.5*  HCT 28.4* 24.8* 22.9*  MCV 102.5* 101.6* 101.3*  MCH 33.2 32.8 33.2  MCHC 32.4 32.3 32.8  RDW 12.0 12.5 12.9  PLT 264 222 244   Thyroid   Recent Labs  Lab 01/18/24 1158  TSH 0.417    BNPNo results for input(s): BNP, PROBNP in the last 168 hours.  DDimer No results for input(s): DDIMER  in the last 168 hours.  Radiology/Studies:  DG CHEST PORT 1 VIEW Result Date: 01/23/2024 CLINICAL DATA:  Aspiration pneumonia EXAM: PORTABLE CHEST 1 VIEW COMPARISON:  01/15/2024 FINDINGS: Atherosclerotic calcification of the aortic arch. Mild thoracic spondylosis. Accentuated markings and interstitial opacity at the lung bases and perihilar regions. Mild central airway thickening. Both increased from prior. Upper normal heart size. No blunting of the lateral costophrenic angles. IMPRESSION: 1. Accentuated markings and interstitial opacity at the lung bases  and perihilar regions, increased from prior. This could be from atypical pneumonia, aspiration pneumonitis, or mild interstitial edema. 2. Upper normal heart size. 3. Aortic Atherosclerosis (ICD10-I70.0). Electronically Signed   By: Ryan Salvage M.D.   On: 01/23/2024 12:14     CT PELVIS WO CONTRAST Result Date: 01/21/2024 EXAM: CT Pelvis, Without IV Contrast 01/21/2024 11:09:00 AM TECHNIQUE: Axial images were acquired through the pelvis without IV contrast. Reformatted images were reviewed. Automated exposure control, iterative reconstruction, and/or weight based adjustment of the mA/kV was utilized to reduce the radiation dose to as low as reasonably achievable. COMPARISON: None available. CLINICAL HISTORY: Fall concern for pelvic fracture. FINDINGS: BONES: No acute fracture is present. Mild degenerative changes are present at the SI joints bilaterally. Facet degenerative changes are present at L4-5 and L5-S1. JOINTS: The hips are located. Mild degenerative changes are present bilaterally. SOFT TISSUES: A 4 cm right adnexal cyst is present. INTRAPELVIC CONTENTS: Atherosclerotic changes are present in the distal aorta and proximal iliac vessels without aneurysm. IMPRESSION: 1. No acute pelvic fracture. 2. 4 cm right adnexal cyst. This is a probable benign cyst. Recommend follow-up ultrasound in 3 to 6 months Electronically signed by: Lonni Necessary MD 01/21/2024 11:55 AM EDT RP Workstation: [id number]     Assessment and Plan:  WCT Images in media tab reviewed,  Difficult though  1st episode graphic suggests perhaps warmed up and cooled down rate Strip images appear from mildly to very irregular NSWCTs images do occur within AFib episodes and are very irregular though do appear to change axis.  No CAD, preserved LVEF  Neg c.MRI  Orthostatic issues make nodal meds difficult Likely to pursue amiodarone  (stay off flecainide  given unable to tolerate nodal agent 2/2 low BPs) EP MD will see  2. 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

## 2024-01-24 NOTE — Progress Notes (Cosign Needed Addendum)
 Physical Therapy Treatment Patient Details Name: [name] MRN: [id number] DOB: 02/09/1941 Today's Date: 01/24/2024   History of Present Illness Pt is is a 83 y.o. female admitted to [facility] 9/6 for  fall  likely d/t orthostasis. Pt experiencing afib, transferred to [facility] 9/11 s/p LHC 9/12. Cardiac MRI 9/15 with results pending at time of session. PMH: PAF, osteoporosis, HLD, chronic back pain.    PT Comments  Pt received in supine, c/o pain in back and R breast with activity, RN notified after session as no one arrived when call bell pressed during session. Pt with symptomatic orthostatic hypotension with standing activity at RW, RN notified. Of note, pt reports she wears compression socks at home but not currently wearing them. She may benefit from trial of BLE compression socks/TED hose to see if this improves hemodynamic stability to progress standing/gait tolerance. Pt needing increased assist, up to minA for sit<>stand and pivotal/pre-gait standing tasks at bedside with RW support, and not able to perform household distance gait trial due to pt fatigue and symptomatic drop in BP with standing. Disposition discussed with pt/family and supervising PT Katie Z and updated per discussion as pt remains a high fall risk and making slow progress toward her goals. Patient will benefit from continued inpatient follow up therapy, <3 hours/day.  Orthostatic Lying   BP- Lying 142/59  Pulse- Lying 66  Orthostatic Sitting  BP- Sitting 152/64  Pulse- Sitting 65  Orthostatic Standing at 0 minutes  BP- Standing at 0 minutes 118/60 (LE weakness sensation)  Pulse- Standing at 0 minutes 68  Orthostatic Standing at 3 minutes  BP- Standing at 3 minutes  (defer, R breast pain and c/o fatigue/weakness)     If plan is discharge home, recommend the following: A little help with bathing/dressing/bathroom;Assistance with cooking/housework;Assist for transportation;A lot of help with walking and/or transfers;Help  with stairs or ramp for entrance   Can travel by private vehicle     Yes  Equipment Recommendations  None recommended by PT (TBD post-acute)    Recommendations for Other Services       Precautions / Restrictions Precautions Precautions: Fall Recall of Precautions/Restrictions: Intact Precaution/Restrictions Comments: orthostatic hypotension Restrictions Weight Bearing Restrictions Per Provider Order: No     Mobility  Bed Mobility Overal bed mobility: Needs Assistance Bed Mobility: Rolling, Sidelying to Sit Rolling: Contact guard assist Sidelying to sit: Min assist, HOB elevated, Used rails       General bed mobility comments: Pt requesting to remain sitting EOB to eat some dinner, sister present and assisting her to take bites, BP stable seated. Bed alarm on for pt safety .    Transfers Overall transfer level: Needs assistance Equipment used: Rolling walker (2 wheels) Transfers: Sit to/from Stand, Bed to chair/wheelchair/BSC Sit to Stand: Min assist   Step pivot transfers: Min assist, Contact guard assist       General transfer comment: cues for hand placement, stability assist during standing. EOB>RW and sidesteps toward L side to [facility] with CGA to minA for AD mgmt/stability. x2 STS from EOB, pt c/o BLE weakness and noted to be orthostatic, symptoms improved after she sat down. RN notified as pt reports that she wears compression socks at home.    Ambulation/Gait               General Gait Details: Defer due to pt c/o R breast pain/burning with activity, per pt this is new for her, RN notified.   Stairs  Wheelchair Mobility     Tilt Bed    Modified Rankin (Stroke Patients Only)       Balance Overall balance assessment: Needs assistance Sitting-balance support: Feet supported, Single extremity supported Sitting balance-Leahy Scale: Fair Sitting balance - Comments: 1 UE support   Standing balance support: Bilateral upper  extremity supported, During functional activity, Reliant on assistive device for balance Standing balance-Leahy Scale: Poor Standing balance comment: RW reliant                            Communication Communication Communication: No apparent difficulties  Cognition Arousal: Alert Behavior During Therapy: WFL for tasks assessed/performed   PT - Cognitive impairments: No apparent impairments                         Following commands: Intact      Cueing Cueing Techniques: Verbal cues, Gestural cues  Exercises Other Exercises Other Exercises: standing hip flexion x10 reps at RW (decreased foot clearance bil as reps increase with c/o evolving weakness sensation)    General Comments General comments (skin integrity, edema, etc.): see BP in comments above; HR 60's bpm; O2 [place] intact, poor signal on finger sensor      Pertinent Vitals/Pain Pain Assessment Pain Assessment: 0-10 Pain Score: 8  Pain Location: R breast (burning), back Pain Descriptors / Indicators: Aching, Discomfort, Grimacing, Guarding, Burning Pain Intervention(s): Limited activity within patient's tolerance, Monitored during session, Repositioned, Patient requesting pain meds-RN notified    Home Living                          Prior Function            PT Goals (current goals can now be found in the care plan section) Acute Rehab PT Goals Patient Stated Goal: To get stronger before I go home, less pain. PT Goal Formulation: With patient Time For Goal Achievement: 02/01/24 Progress towards PT goals: Progressing toward goals    Frequency    Min 3X/week      PT Plan      Co-evaluation              AM-PAC PT 6 Clicks Mobility   Outcome Measure  Help needed turning from your back to your side while in a flat bed without using bedrails?: A Little Help needed moving from lying on your back to sitting on the side of a flat bed without using bedrails?: A Lot (w/o  rail) Help needed moving to and from a bed to a chair (including a wheelchair)?: A Little Help needed standing up from a chair using your arms (e.g., wheelchair or bedside chair)?: A Little Help needed to walk in hospital room?: Total (too dizzy) Help needed climbing 3-5 steps with a railing? : Total 6 Click Score: 13    End of Session Equipment Utilized During Treatment: Gait belt;Oxygen Activity Tolerance: Patient limited by fatigue;Patient limited by pain Patient left: in bed;with call bell/phone within reach;with bed alarm set;with family/visitor present (sitting EOB with family assisting her to eat) Nurse Communication: Mobility status;Patient requests pain meds;Precautions;Other (comment) (R breast pain new c/o; orthostatic hypotension) PT Visit Diagnosis: Unsteadiness on feet (R26.81);Other abnormalities of gait and mobility (R26.89);Pain Pain - Right/Left: Right Pain - part of body:  (breast pain; chronic back pain also)     Time: 8263-8244 PT Time Calculation (min) (ACUTE ONLY): 19 min  Charges:    $Therapeutic Activity: 8-22 mins PT General Charges $$ ACUTE PT VISIT: 1 Visit                     Yasseen Salls P., PTA Acute Rehabilitation Services Secure Chat Preferred 9a-5:30pm Office: [phone]    Connell HERO [facility] 01/24/2024, 6:16 PM

## 2024-01-24 NOTE — Progress Notes (Signed)
 Occupational Therapy Treatment Patient Details Name: [name] MRN: [id number] DOB: 01-17-1941 Today's Date: 01/24/2024   History of present illness Pt is is a 83 y.o. female admitted to [facility] 9/6 for  fall  likely d/t orthostasis. Pt experiencing afib, transferred to [facility] 9/11 with plan for [facility] 9/12.  PMH: PAF, osteoporosis, HLD, chronic pain   OT comments  Pt at this time presented in bed and agreed to come to EOB to sit and eat breakfast and take lateral steps. She was able to complete bed mobility to EOB with min assist and required set up for meal. She then with mod assist for sit to stand transfers and min-mod assist to take lateral steps to EOB. Pt then wanted to go back into bed and needed max assist for adult brief change. At this time changed recommendation for SNF and pt is agreeable due to the need for more assist.       If plan is discharge home, recommend the following:  A lot of help with walking and/or transfers;A lot of help with bathing/dressing/bathroom;Assistance with feeding;Direct supervision/assist for medications management;Direct supervision/assist for financial management   Equipment Recommendations  Other (comment) (TBD at next venue)    Recommendations for Other Services      Precautions / Restrictions Precautions Precautions: Fall Recall of Precautions/Restrictions: Intact Restrictions Weight Bearing Restrictions Per Provider Order: No       Mobility Bed Mobility Overal bed mobility: Needs Assistance Bed Mobility: Supine to Sit, Sit to Supine   Sidelying to sit: Contact guard assist Supine to sit: Contact guard Sit to supine: Min assist, Mod assist        Transfers Overall transfer level: Needs assistance Equipment used: 1 person hand held assist Transfers: Sit to/from Stand Sit to Stand: Mod assist                 Balance Overall balance assessment: Needs assistance Sitting-balance support: Feet supported, Bilateral upper extremity  supported, Single extremity supported Sitting balance-Leahy Scale: Fair Sitting balance - Comments: needed atleast unilateral support   Standing balance support: Bilateral upper extremity supported Standing balance-Leahy Scale: Fair Standing balance comment: whanted HH support                           ADL either performed or assessed with clinical judgement   ADL Overall ADL's : Needs assistance/impaired Eating/Feeding: Independent;Sitting   Grooming: Set up;Sitting   Upper Body Bathing: Contact guard assist;Minimal assistance;Sitting   Lower Body Bathing: Maximal assistance;Sit to/from stand;Sitting/lateral leans   Upper Body Dressing : Contact guard assist;Minimal assistance;Sitting   Lower Body Dressing: Maximal assistance;Sit to/from stand   Toilet Transfer: Moderate assistance Toilet Transfer Details (indicate cue type and reason): simulated with lateral stepping at the EOB Toileting- Clothing Manipulation and Hygiene: Maximal assistance;Sit to/from stand       Functional mobility during ADLs: Moderate assistance General ADL Comments: hand held assist    Extremity/Trunk Assessment Upper Extremity Assessment Upper Extremity Assessment: Generalized weakness            Vision   Vision Assessment?: No apparent visual deficits   Perception Perception Perception: Not tested   Praxis Praxis Praxis: Not tested   Communication Communication Communication: No apparent difficulties   Cognition Arousal: Alert, Lethargic Behavior During Therapy: WFL for tasks assessed/performed Cognition: No apparent impairments  Following commands: Intact        Cueing   Cueing Techniques: Verbal cues, Tactile cues, Visual cues  Exercises      Shoulder Instructions       General Comments      Pertinent Vitals/ Pain       Pain Assessment Pain Assessment: Faces Faces Pain Scale: Hurts even more Pain Location:  everything per pt Pain Descriptors / Indicators: Aching, Discomfort, Grimacing, Guarding Pain Intervention(s): Monitored during session, Limited activity within patient's tolerance, Repositioned, Relaxation  Home Living                                          Prior Functioning/Environment              Frequency  Min 2X/week        Progress Toward Goals  OT Goals(current goals can now be found in the care plan section)  Progress towards OT goals: Progressing toward goals  Acute Rehab OT Goals Patient Stated Goal: to get better OT Goal Formulation: With patient Time For Goal Achievement: 02/01/24 Potential to Achieve Goals: Good ADL Goals Pt Will Perform Grooming: Independently;standing Pt Will Perform Lower Body Dressing: Independently Pt Will Transfer to Toilet: Independently;ambulating Pt Will Perform Toileting - Clothing Manipulation and hygiene: Independently Pt/caregiver will Perform Home Exercise Program: Increased ROM;Increased strength;Right Upper extremity;Left upper extremity;Independently  Plan      Co-evaluation                 AM-PAC OT 6 Clicks Daily Activity     Outcome Measure   Help from another person eating meals?: None Help from another person taking care of personal grooming?: A Little Help from another person toileting, which includes using toliet, bedpan, or urinal?: A Lot Help from another person bathing (including washing, rinsing, drying)?: A Lot Help from another person to put on and taking off regular upper body clothing?: A Little Help from another person to put on and taking off regular lower body clothing?: A Lot 6 Click Score: 16    End of Session Equipment Utilized During Treatment: Gait belt  OT Visit Diagnosis: Unsteadiness on feet (R26.81);Other abnormalities of gait and mobility (R26.89);Muscle weakness (generalized) (M62.81);History of falling (Z91.81);Pain Pain - part of body:   (back,everywhere)   Activity Tolerance Patient tolerated treatment well   Patient Left in bed;with call bell/phone within reach;with bed alarm set   Nurse Communication Mobility status        Time: 9162-9093 OT Time Calculation (min): 29 min  Charges: OT General Charges $OT Visit: 1 Visit OT Treatments $Self Care/Home Management : 23-37 mins  Warrick POUR OTR/L  Acute Rehab Services  [phone] office number   Warrick Berber 01/24/2024, 9:12 AM

## 2024-01-24 NOTE — Plan of Care (Addendum)
 Orthopnea and wheezing RN reported that patient is complaining about shortness of breath physical exam diffuse rhonchi and wheezing.  Given Xopenex  breathing treatment.  Chest x-ray showing worsening bilateral pleural effusion and cardiomegaly.  Giving IV Lasix  20 mg.   Patient is currently on not any IV fluid anymore. Initially being admitted for orthostatics which caused fall and found to have AKI. Now patient going to other direction developing volume overload.  Patient already has echo done 9/10 which showed preserved EF and normal diastolic parameter. Checking BNP.

## 2024-01-24 NOTE — Progress Notes (Signed)
 Initial Nutrition Assessment  DOCUMENTATION CODES:      INTERVENTION:  Liberalize diet it allow more food option  Ensure Plus High Protein po TID, each supplement provides 350 kcal and 20 grams of protein.    NUTRITION DIAGNOSIS:   Inadequate oral intake related to decreased appetite, acute illness as evidenced by per patient/family report.   GOAL:   Patient will meet greater than or equal to 90% of their needs   MONITOR:   PO intake, Supplement acceptance  REASON FOR ASSESSMENT:   Consult Assessment of nutrition requirement/status  ASSESSMENT:   Pt off unit this morning at MRI. Breakfast tray in room, 100% ensurse and 50% hot chocolate consumed. RD returned to see patient this afternoon, seen sitting up in chair. She did not eat much of lunch. Reports good po intake at home but decreased appetite since admission due ot overall not feeling well, average 30% po intake of documented meals. Likes the chocolate ensure, will increase to TID. Patient stable weight is around 105 lbs, current admit wt of 112 lbs likely elevated due to fluid (non pitting edema BLE). Patient c/o 10/10 back pain and requesting meds, RN notified.  Labs: Glu 117  Cr 1.23 Calcium  7.5    Meds:  Lactulose    NUTRITION - FOCUSED PHYSICAL EXAM:  Flowsheet Row Most Recent Value  Orbital Region No depletion  Upper Arm Region Mild depletion  Buccal Region No depletion  Temple Region Mild depletion  Clavicle Bone Region Mild depletion  Clavicle and Acromion Bone Region Mild depletion  Patellar Region No depletion  Anterior Thigh Region No depletion  Posterior Calf Region No depletion  Edema (RD Assessment) --  [BLE non pitting]  Hair Reviewed  Eyes Reviewed  Mouth Reviewed  Skin Reviewed  Nails Reviewed    Diet Order:   Diet Order             Diet Heart Room service appropriate? Yes; Fluid consistency: Thin  Diet effective now                   EDUCATION NEEDS:   No education  needs have been identified at this time  Skin:  Skin Assessment: Skin Integrity Issues: Skin Integrity Issues:: Other (Comment) Other: Abrasion to left face, Ecchymosis to left face/right arm  Last BM:  9/14  Height:   Ht Readings from Last 1 Encounters:  01/21/24 5' 2 (1.575 m)    Weight:   Wt Readings from Last 1 Encounters:  01/21/24 51.1 kg    Ideal Body Weight:  50 kg  BMI:  Body mass index is 20.6 kg/m.  Estimated Nutritional Needs:   Kcal:  8721-8466 kcal  Protein:  51-61 grams  Fluid:  1278 ml/d  Takiah Maiden, MS, RD, LDN Clinical Dietitian  Please see AMiON for contact information.

## 2024-01-24 NOTE — Progress Notes (Signed)
 PHARMACY - ANTICOAGULATION CONSULT NOTE  Pharmacy Consult for IV heparin  Indication: atrial fibrillation  Allergies  Allergen Reactions   Flecainide  Other (See Comments)    Rate related aberrancy versus slow VT, asymptomatic   Patient Measurements: Height: 5' 2 (157.5 cm) Weight: 51.1 kg (112 lb 10.5 oz) IBW/kg (Calculated) : 50.1 HEPARIN  DW (KG): 51.1  Vital Signs: Temp: 98.3 F (36.8 C) (09/15 0733) Temp Source: Oral (09/15 0733) BP: 142/62 (09/15 0733) Pulse Rate: 72 (09/15 0733)  Labs: Recent Labs    01/22/24 0536 01/22/24 2115 01/23/24 0531 01/23/24 0817 01/24/24 0552  HGB 9.2*  --  8.0*  --  7.5*  HCT 28.4*  --  24.8*  --  22.9*  PLT 264  --  222  --  244  APTT 32 60*  --  72* 82*  HEPARINUNFRC  --  >1.10*  --  >1.10* 0.92*  CREATININE 1.61*  --  1.47*  --  1.23*    Estimated Creatinine Clearance: 28.9 mL/min (A) (by C-G formula based on SCr of 1.23 mg/dL (H)).   Medical History: Past Medical History:  Diagnosis Date   Adenocarcinoma, colon (HCC) 03/2005   Stage I; resected in 03/2005   Arthritis    Colon cancer [facility])    Removed surgicaly    Dysrhythmia    Gastroesophageal reflux disease    Gastroparesis    Hearing loss    mild   Hyperlipidemia    Irregular heartbeat    Ovarian cyst 2008   Pain in joint of right shoulder 06/29/2017   Pain in joint, ankle and foot 03/13/2014   Paroxysmal atrial fibrillation (HCC)    Palpitations; maintained on flecainide ; -normal coronary angiography and 1999; negative stress nuclear study in 11/2005   PONV (postoperative nausea and vomiting)    Tobacco abuse, in remission    10-pack-years; quit in 2001   Medications:  Scheduled:   feeding supplement  237 mL Oral BID BM   lactulose   20 g Oral QHS   metoprolol  tartrate  25 mg Oral BID   midodrine   5 mg Oral TID WC   pantoprazole   40 mg Oral Daily   QUEtiapine   25 mg Oral Q24H   rosuvastatin   40 mg Oral Daily   sodium chloride  flush  3 mL Intravenous Q12H    Infusions:    ceFAZolin  (ANCEF ) IV     heparin  850 Units/hr (01/23/24 1847)   Assessment: Patient is 83 YO F presenting to OSH for fall and transferred to [facility] for cardiac cath. Patient with known afib on DOAC PTA. Last dose of apixaban  was in the AM of 9/11. She is noted wit ha recent fall and has a bruise under her left eye  Anticoagulation with IV heparin  started with possibility of EP device placement. Pharmacy has been consulted to dose IV heparin  in the meantime.  -aPTT= 82 on heparin  850 units/hr   Goal of Therapy:  Heparin  level 0.3-0.7 units/ml aPTT 66-102 seconds Monitor platelets by anticoagulation protocol: Yes   Plan:  Continue IV heparin  at 850 units/hr. Monitor aPTT, heparin  level, CBC, signs/sx bleeding daily.   Prentice Poisson, PharmD Clinical Pharmacist **Pharmacist phone directory can now be found on [email or web address] ([email or web address]).  Listed under [facility].

## 2024-01-24 NOTE — Progress Notes (Signed)
 PROGRESS NOTE    [name]  FMW:[id number] DOB: February 27, 1941 DOA: 01/15/2024 PCP: Alphonsa Glendia LABOR, MD   Brief Narrative:  [name] is a 83 y.o. female with medical history significant of PAF on anticoagulation and flecainide , ostoporosis, hyperlipidemia, chronic pain on opiates, long hystory of orthostasis and has been on midodrine  intermittently over the last year presented with fall.  She was admitted to hospitalist service and [facility] for evaluation of fall due to orthostasis.  Due to ventricular tachycardia, seen by cardiology there, they recommended cardiac cath, patient transferred to [facility] for that.  Assessment & Plan:   Principal Problem:   Orthostasis Active Problems:   Paroxysmal atrial fibrillation (HCC)   Osteoporosis   Hyponatremia   Leukocytosis  Orthostasis S/p Fall, head trauma- -Long h/o orthostasis, complicated by dehydration.  She is on midodrine  5 mg 3 times daily PTA.  However her blood pressure has remained high, with systolic anywhere between 140-160 for most part.  Placed holding parameters to midodrine . - 2D echo demonstrating preserved ejection fraction, no wall motion normalities and no significant valvular disorder. - Continue as needed analgesics.   Ventricular tachycardia- Given ongoing changes and intermittent no sustained episodes of ventricular tachycardia decision has been made to proceed with cardiac cath for further evaluation.  Status postcardiac cath 01/21/2024 with normal coronary arteries.  Cardiology plans to get cardiac MRI today.   Paroxysmal atrial fibrillation - On Eliquis  PTA, transitioned to heparin  for now per cardiology.  Rates controlled.   AKI on CKD stage IIIa/dehydration: Patient's creatinine varies and ranges anywhere from 0.8-1.2.  It peaked at 1.67 day before yesterday and has continued to improve ever since and is back down to 1.2 which is her baseline.  She appears better hydrated but I will hydrate her little  more today.   Hyponatremia In the setting of dehydration, likely hypovolemic, improved to 130 from 127 yesterday and has remained stable compared to yesterday.  Chronic back pain: Resume her home dose of hydrocodone  medications which she takes every 6 hours as needed.  She still complains of back pain however she did not cry last night for extra IV pain medications.  She was lethargic this morning.  Nurse was at the bedside as well.  Discussed with the patient as well as the nurse about my concern about her lethargy which is likely secondary to opioids, we will absolutely not escalate pain medications anymore.   Leukocytosis: The reactive/stress demargination however it has not improved in last few days.  No other source of infection with chest x-ray and UA unremarkable.  Patient is afebrile.  Reexamined her facial hematoma which also has actually improved and does not appear to have any signs of cellulitis.  Will monitor with CBC.  Large bruise/hematoma under left eye: This happened a week ago due to fall.  Nurses have been advised to apply cool compresses every 2-3 hours.  This appears to be improving gradually.   Debility- Generalized weakness- -Due to poor oral intake. -Lives alone, seen by PT OT, they recommend discharge home with home health however I am concerned about her safety.  I have reconsulted PT OT.  Suspect she is going to need to go to SNF.  GOC: Patient very much alert today, she started talking to me about her wishes.  Will get palliative care on board to have detailed conversation with her and clarify GOC.  DVT prophylaxis: Heparin    Code Status: Full Code  Family Communication:  None  present at bedside.  Long discussion with the daughter-in-law yesterday.  Status is: Inpatient Remains inpatient appropriate because: Cardiology plans cardiac MRI and EP to see today as well.       Estimated body mass index is 20.6 kg/m as calculated from the following:   Height as of  this encounter: 5' 2 (1.575 m).   Weight as of this encounter: 51.1 kg.    Nutritional Assessment: Body mass index is 20.6 kg/m.SABRA Seen by dietician.  I agree with the assessment and plan as outlined below: Nutrition Status:        . Skin Assessment: I have examined the patient's skin and I agree with the wound assessment as performed by the wound care RN as outlined below:    Consultants:  Cardiology  Procedures:  Above  Antimicrobials:  Anti-infectives (From admission, onward)    Start     Dose/Rate Route Frequency Ordered Stop   01/24/24 0900  ceFAZolin  (ANCEF ) IVPB 2g/100 mL premix        2 g 200 mL/hr over 30 Minutes Intravenous Every 8 hours 01/24/24 0809 01/31/24 0559         Subjective: Patient seen and examined.  Patient is fully alert and oriented.  She has no new complaint other than chronic back pain.  She says her facial pain is not as bad as the back pain.  She had lengthy discussion with me.  She broached the topic with me about her goal of care, she said that if she were to be close to dying, she would prefer to go home and die.  She is also willing to go to rehabilitation/SNF if needed.  We also discussed that she should not be taking more than 4 tablets of hydrocodone  daily in order to prevent hypotension and lethargy.  She appears to be more hydrated today as well.  Objective: Vitals:   01/23/24 1533 01/23/24 2055 01/24/24 0026 01/24/24 0733  BP: (!) 148/83 (!) 143/50 124/61 (!) 142/62  Pulse: 81 (!) 52 77 72  Resp: (!) 22 16 16 18   Temp: 97.9 F (36.6 C) 98 F (36.7 C) 97.6 F (36.4 C) 98.3 F (36.8 C)  TempSrc: Oral Oral Oral Oral  SpO2: 99% 95% 94% 99%  Weight:      Height:        Intake/Output Summary (Last 24 hours) at 01/24/2024 0809 Last data filed at 01/24/2024 0458 Gross per 24 hour  Intake 2777.41 ml  Output 1450 ml  Net 1327.41 ml   Filed Weights   01/15/24 2210 01/20/24 2223 01/21/24 0852  Weight: 47.7 kg 51.1 kg 51.1 kg     Examination:  General exam: Appears calm and comfortable, large hematoma at the left maxillary area which is improving in size and is improving in color as well. Respiratory system: Clear to auscultation. Respiratory effort normal. Cardiovascular system: S1 & S2 heard, RRR. No JVD, murmurs, rubs, gallops or clicks. No pedal edema. Gastrointestinal system: Abdomen is nondistended, soft and nontender. No organomegaly or masses felt. Normal bowel sounds heard. Central nervous system: Alert and oriented. No focal neurological deficits. Extremities: Symmetric 5 x 5 power. Skin: No rashes, lesions or ulcers.  Psychiatry: Judgement and insight appear normal. Mood & affect appropriate.    Data Reviewed: I have personally reviewed following labs and imaging studies  CBC: Recent Labs  Lab 01/21/24 0652 01/21/24 1455 01/21/24 1504 01/21/24 1607 01/22/24 0536 01/23/24 0531 01/24/24 0552  WBC 16.6*  --   --  19.7*  16.4* 18.2* 17.2*  HGB 8.4*   < > 8.5* 9.2* 9.2* 8.0* 7.5*  HCT 25.3*   < > 25.0* 27.2* 28.4* 24.8* 22.9*  MCV 99.6  --   --  100.4* 102.5* 101.6* 101.3*  PLT 221  --   --  229 264 222 244   < > = values in this interval not displayed.   Basic Metabolic Panel: Recent Labs  Lab 01/18/24 0258 01/18/24 0740 01/19/24 0443 01/20/24 0541 01/21/24 0430 01/21/24 0652 01/21/24 1455 01/21/24 1504 01/21/24 1607 01/22/24 0536 01/23/24 0531  NA 132*  --  131* 130* 130* 127* 126* 127*  --  130* 130*  K 3.8  --  4.3 4.4 4.6 4.3 4.7 4.4  --  4.8 3.7  CL 101  --  101 96* 94* 96*  --   --   --  95* 103  CO2 22  --  22 23 21* 21*  --   --   --  20* 17*  GLUCOSE 126*  --  122* 131* 113* 111*  --   --   --  93 121*  BUN 22  --  22 22 29* 29*  --   --   --  33* 30*  CREATININE 1.02*  --  1.07* 1.10* 1.66* 1.59*  --   --  1.67* 1.61* 1.47*  CALCIUM  8.7*  --  8.0* 7.9* 8.1* 7.7*  --   --   --  7.9* 7.2*  MG 1.5*  --  1.9 1.9 1.9  --   --   --   --   --   --   PHOS  --  1.9* 3.4 3.3  2.9  --   --   --   --   --   --    GFR: Estimated Creatinine Clearance: 24.1 mL/min (A) (by C-G formula based on SCr of 1.47 mg/dL (H)). Liver Function Tests: No results for input(s): AST, ALT, ALKPHOS, BILITOT, PROT, ALBUMIN in the last 168 hours.  No results for input(s): LIPASE, AMYLASE in the last 168 hours. No results for input(s): AMMONIA in the last 168 hours. Coagulation Profile: No results for input(s): INR, PROTIME in the last 168 hours. Cardiac Enzymes: No results for input(s): CKTOTAL, CKMB, CKMBINDEX, TROPONINI in the last 168 hours. BNP (last 3 results) No results for input(s): PROBNP in the last 8760 hours. HbA1C: No results for input(s): HGBA1C in the last 72 hours. CBG: No results for input(s): GLUCAP in the last 168 hours. Lipid Profile: No results for input(s): CHOL, HDL, LDLCALC, TRIG, CHOLHDL, LDLDIRECT in the last 72 hours. Thyroid  Function Tests: No results for input(s): TSH, T4TOTAL, FREET4, T3FREE, THYROIDAB in the last 72 hours.  Anemia Panel: No results for input(s): VITAMINB12, FOLATE, FERRITIN, TIBC, IRON, RETICCTPCT in the last 72 hours. Sepsis Labs: No results for input(s): PROCALCITON, LATICACIDVEN in the last 168 hours.  No results found for this or any previous visit (from the past 240 hours).   Radiology Studies: DG CHEST PORT 1 VIEW Result Date: 01/23/2024 CLINICAL DATA:  Aspiration pneumonia EXAM: PORTABLE CHEST 1 VIEW COMPARISON:  01/15/2024 FINDINGS: Atherosclerotic calcification of the aortic arch. Mild thoracic spondylosis. Accentuated markings and interstitial opacity at the lung bases and perihilar regions. Mild central airway thickening. Both increased from prior. Upper normal heart size. No blunting of the lateral costophrenic angles. IMPRESSION: 1. Accentuated markings and interstitial opacity at the lung bases and perihilar regions, increased from prior. This could  be from atypical  pneumonia, aspiration pneumonitis, or mild interstitial edema. 2. Upper normal heart size. 3. Aortic Atherosclerosis (ICD10-I70.0). Electronically Signed   By: Ryan Salvage M.D.   On: 01/23/2024 12:14    Scheduled Meds:  feeding supplement  237 mL Oral BID BM   lactulose   20 g Oral QHS   metoprolol  tartrate  25 mg Oral BID   midodrine   5 mg Oral TID WC   pantoprazole   40 mg Oral Daily   QUEtiapine   25 mg Oral Q24H   rosuvastatin   40 mg Oral Daily   sodium chloride  flush  3 mL Intravenous Q12H   Continuous Infusions:  sodium chloride  150 mL/hr at 01/24/24 0420    ceFAZolin  (ANCEF ) IV     heparin  850 Units/hr (01/23/24 1847)     LOS: 3 days   Fredia Skeeter, MD [facility]  01/24/2024, 8:09 AM   *Please note that this is a verbal dictation therefore any spelling or grammatical errors are due to the Dragon Medical One system interpretation.  Please page via Amion and do not message via secure chat for urgent patient care matters. Secure chat can be used for non urgent patient care matters.  How to contact the [facility] Attending or Consulting provider 7A - 7P or covering provider during after hours 7P -7A, for this patient?  Check the care team in [facility] and look for a) attending/consulting [facility] provider listed and b) the [facility] team listed. Page or secure chat 7A-7P. Log into [email or web address] and use [place] universal password to access. If you do not have the password, please contact the hospital operator. Locate the [facility] provider you are looking for under [facility] and page to a number that you can be directly reached. If you still have difficulty reaching the provider, please page the [facility] (Director on Call) for the Hospitalists listed on amion for assistance.

## 2024-01-25 ENCOUNTER — Encounter: Payer: Self-pay | Admitting: Gastroenterology

## 2024-01-25 DIAGNOSIS — I480 Paroxysmal atrial fibrillation: Secondary | ICD-10-CM | POA: Diagnosis not present

## 2024-01-25 DIAGNOSIS — I951 Orthostatic hypotension: Secondary | ICD-10-CM | POA: Diagnosis not present

## 2024-01-25 DIAGNOSIS — J90 Pleural effusion, not elsewhere classified: Secondary | ICD-10-CM | POA: Diagnosis not present

## 2024-01-25 DIAGNOSIS — Z7189 Other specified counseling: Secondary | ICD-10-CM

## 2024-01-25 DIAGNOSIS — Z515 Encounter for palliative care: Secondary | ICD-10-CM

## 2024-01-25 LAB — BASIC METABOLIC PANEL WITH GFR
Anion gap: 11 (ref 5–15)
BUN: 19 mg/dL (ref 8–23)
CO2: 19 mmol/L — ABNORMAL LOW (ref 22–32)
Calcium: 7.9 mg/dL — ABNORMAL LOW (ref 8.9–10.3)
Chloride: 111 mmol/L (ref 98–111)
Creatinine, Ser: 1.32 mg/dL — ABNORMAL HIGH (ref 0.44–1.00)
GFR, Estimated: 41 mL/min — ABNORMAL LOW (ref >60)
Glucose, Bld: 174 mg/dL — ABNORMAL HIGH (ref 70–99)
Potassium: 3 mmol/L — ABNORMAL LOW (ref 3.5–5.1)
Sodium: 141 mmol/L (ref 135–145)

## 2024-01-25 LAB — CBC
HCT: 24.6 % — ABNORMAL LOW (ref 36.0–46.0)
Hemoglobin: 7.9 g/dL — ABNORMAL LOW (ref 12.0–15.0)
MCH: 32.6 pg (ref 26.0–34.0)
MCHC: 32.1 g/dL (ref 30.0–36.0)
MCV: 101.7 fL — ABNORMAL HIGH (ref 80.0–100.0)
Platelets: 302 K/uL (ref 150–400)
RBC: 2.42 MIL/uL — ABNORMAL LOW (ref 3.87–5.11)
RDW: 13.2 % (ref 11.5–15.5)
WBC: 20.2 K/uL — ABNORMAL HIGH (ref 4.0–10.5)
nRBC: 0.1 % (ref 0.0–0.2)

## 2024-01-25 LAB — HEPARIN LEVEL (UNFRACTIONATED): Heparin Unfractionated: >1.1 [IU]/mL — ABNORMAL HIGH (ref 0.30–0.70)

## 2024-01-25 LAB — BRAIN NATRIURETIC PEPTIDE: B Natriuretic Peptide: 1072.2 pg/mL — ABNORMAL HIGH (ref 0.0–100.0)

## 2024-01-25 LAB — APTT: aPTT: 37 s — ABNORMAL HIGH (ref 24–36)

## 2024-01-25 MED ORDER — FUROSEMIDE 10 MG/ML IJ SOLN
20.0000 mg | Freq: Once | INTRAMUSCULAR | Status: AC
  Administered 2024-01-25: 20 mg via INTRAVENOUS
  Filled 2024-01-25: qty 2

## 2024-01-25 MED ORDER — POTASSIUM CHLORIDE CRYS ER 20 MEQ PO TBCR
40.0000 meq | EXTENDED_RELEASE_TABLET | ORAL | Status: AC
  Administered 2024-01-25 (×2): 40 meq via ORAL
  Filled 2024-01-25: qty 2

## 2024-01-25 MED ORDER — FUROSEMIDE 10 MG/ML IJ SOLN
20.0000 mg | INTRAMUSCULAR | Status: AC
  Administered 2024-01-25: 20 mg via INTRAVENOUS
  Filled 2024-01-25: qty 2

## 2024-01-25 MED ORDER — SODIUM CHLORIDE 0.9 % IV SOLN
3.0000 g | Freq: Two times a day (BID) | INTRAVENOUS | Status: DC
  Administered 2024-01-25 – 2024-01-27 (×5): 3 g via INTRAVENOUS
  Filled 2024-01-25 (×5): qty 8

## 2024-01-25 NOTE — [place] FL2 (Signed)
   MEDICAID FL2 LEVEL OF CARE FORM     IDENTIFICATION  Patient Name: [name] Birthdate: 01-21-1941 Sex: female Admission Date (Current Location): 01/15/2024  [facility] and [place] Number:  Producer, television/film/video and Address:  [facility], [address] [place], [place] [address]      Provider Number: [id number]  Attending Physician Name and Address:  Vernon Ranks, MD  Relative Name and Phone Number:  Harman Pounds (son)[phone] [name] Pounds (daughter-n-law)[phone]    Current Level of Care: Hospital Recommended Level of Care: Skilled Nursing Facility Prior Approval Number:    Date Approved/Denied:   PASRR Number: [id number]  Discharge Plan: SNF    Current Diagnoses: Patient Active Problem List   Diagnosis Date Noted   Orthostasis 01/15/2024   Hyponatremia 01/15/2024   Leukocytosis 01/15/2024   Aortic atherosclerosis (HCC) 05/25/2023   Chronic obstructive pulmonary disease (HCC) 05/25/2023   Osteoporosis 03/11/2023   Encounter for monitoring opioid maintenance therapy 03/26/2020   Loss of weight 01/23/2019   Adnexal mass 01/13/2019   Chronic pain syndrome 11/08/2018   Abnormal CT of the abdomen 06/23/2018   Gastric wall thickening 06/23/2018   History of colonic polyps 04/27/2018   History of colon cancer 04/27/2018   Family hx of colon cancer 04/27/2018   Early satiety 09/20/2017   Lumbar spondylosis 03/24/2017   Malignant neoplasm of colon (HCC) 01/21/2017   Sciatica of right side 03/06/2015   Knee pain, left 03/13/2014   Muscle weakness (generalized) 01/10/2014   Difficulty walking 01/10/2014   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   Sciatica of left side 04/26/2013   Chest pain at rest 03/21/2013   Prediabetes 12/30/2012   Paroxysmal atrial fibrillation (HCC)    Malignant neoplasm of colon (HCC) 06/24/2009   GASTROESOPHAGEAL REFLUX DISEASE 06/24/2009   Hyperlipidemia 06/12/2009     Orientation RESPIRATION BLADDER Height & Weight     Place, Self, Situation  Normal Incontinent, External catheter (External Urinary Catheter) Weight: 112 lb 10.5 oz (51.1 kg) Height:  5' 2 (157.5 cm)  BEHAVIORAL SYMPTOMS/MOOD NEUROLOGICAL BOWEL NUTRITION STATUS      Continent Diet (Please see discharge summary)  AMBULATORY STATUS COMMUNICATION OF NEEDS Skin   Extensive Assist Verbally Other (Comment) (Abrasion,face,L,Ecchymosis,Arm,Chest,Leg,R,L,Erythema,Arm,R,location of hematoma left cheek,Wound/Incision LDAs,Wound traumatic face,Left)                       Personal Care Assistance Level of Assistance  Bathing, Feeding, Dressing Bathing Assistance: Limited assistance Feeding assistance: Independent Dressing Assistance: Limited assistance     Functional Limitations Info  Sight, Hearing, Speech Sight Info:  (trauma,drainage) Hearing Info: Adequate Speech Info: Adequate    SPECIAL CARE FACTORS FREQUENCY  PT (By licensed PT), OT (By licensed OT)     PT Frequency: 5x min weekly OT Frequency: 5x min weekly            Contractures Contractures Info: Not present    Additional Factors Info  Code Status, Allergies, Psychotropic Code Status Info: FULL Allergies Info: Flecainide  Psychotropic Info: QUEtiapine  (SEROQUEL ) tablet 25 mg every 24 hours         Current Medications (01/25/2024):  This is the current hospital active medication list Current Facility-Administered Medications  Medication Dose Route Frequency Provider Last Rate Last Admin   acetaminophen  (TYLENOL ) tablet 650 mg  650 mg Oral Q6H PRN Pahwani, Ravi, MD   650 mg at 01/24/24 0925   amiodarone  (PACERONE ) tablet 200 mg  200 mg  Oral BID Ursuy, Renee Lynn, PA-C   200 mg at 01/25/24 1045   Ampicillin -Sulbactam (UNASYN ) 3 g in sodium chloride  0.9 % 100 mL IVPB  3 g Intravenous Q12H Gretel Prentice BIRCH, RPH 200 mL/hr at 01/25/24 1052 3 g at 01/25/24 1052   apixaban  (ELIQUIS ) tablet 2.5 mg  2.5 mg Oral BID  Ursuy, Renee Lynn, PA-C   2.5 mg at 01/25/24 1045   feeding supplement (ENSURE PLUS HIGH PROTEIN) liquid 237 mL  237 mL Oral TID BM Vernon Ranks, MD   237 mL at 01/25/24 1557   HYDROcodone -acetaminophen  (NORCO) 10-325 MG per tablet 1 tablet  1 tablet Oral QID PRN Vernon Ranks, MD   1 tablet at 01/25/24 1556   lactulose  (CHRONULAC ) 10 GM/15ML solution 20 g  20 g Oral QHS Anner Alm ORN, MD   20 g at 01/20/24 2106   levalbuterol  (XOPENEX ) nebulizer solution 0.63 mg  0.63 mg Nebulization Q6H PRN Sundil, Subrina, MD   0.63 mg at 01/24/24 2225   methocarbamol  (ROBAXIN ) tablet 750 mg  750 mg Oral Q8H PRN Anner Alm ORN, MD   750 mg at 01/25/24 1044   midodrine  (PROAMATINE ) tablet 5 mg  5 mg Oral TID WC Vernon Ranks, MD   5 mg at 01/24/24 1600   pantoprazole  (PROTONIX ) EC tablet 40 mg  40 mg Oral Daily Anner Alm ORN, MD   40 mg at 01/25/24 1045   potassium chloride  SA (KLOR-CON  M) CR tablet 40 mEq  40 mEq Oral Q4H Pahwani, Ravi, MD   40 mEq at 01/25/24 1556   prochlorperazine  (COMPAZINE ) injection 10 mg  10 mg Intravenous Q6H PRN Anner Alm ORN, MD   10 mg at 01/20/24 2350   QUEtiapine  (SEROQUEL ) tablet 25 mg  25 mg Oral Q24H Vernon Ranks, MD   25 mg at 01/24/24 2149   rosuvastatin  (CRESTOR ) tablet 40 mg  40 mg Oral Daily Anner Alm ORN, MD   40 mg at 01/25/24 1044   sodium chloride  flush (NS) 0.9 % injection 3 mL  3 mL Intravenous Q12H Anner Alm ORN, MD   3 mL at 01/25/24 1045   sodium chloride  flush (NS) 0.9 % injection 3 mL  3 mL Intravenous PRN Anner Alm ORN, MD         Discharge Medications: Please see discharge summary for a list of discharge medications.  Relevant Imaging Results:  Relevant Lab Results:   Additional Information [id number]  Isaiah Public, LCSWA

## 2024-01-25 NOTE — Progress Notes (Signed)
 Pharmacy Antibiotic Note  [name] is a 83 y.o. female  with pneumonia.  Pharmacy has been consulted for Unasyn  dosing.  Plan: -Unasyn  3gm IV q12h -Will follow renal function and clinical progress   Height: 5' 2 (157.5 cm) Weight: 51.1 kg (112 lb 10.5 oz) IBW/kg (Calculated) : 50.1  Temp (24hrs), Avg:98.3 F (36.8 C), Min:97.7 F (36.5 C), Max:98.9 F (37.2 C)  Recent Labs  Lab 01/21/24 0652 01/21/24 1607 01/22/24 0536 01/23/24 0531 01/24/24 0552 01/25/24 0108  WBC 16.6* 19.7* 16.4* 18.2* 17.2* 20.2*  CREATININE 1.59* 1.67* 1.61* 1.47* 1.23*  --     Estimated Creatinine Clearance: 28.9 mL/min (A) (by C-G formula based on SCr of 1.23 mg/dL (H)).    Allergies  Allergen Reactions   Flecainide  Other (See Comments)    Rate related aberrancy versus slow VT, asymptomatic    Antimicrobials this admission: 9/16>>    Thank you for allowing pharmacy to be a part of this patient's care.   Prentice Poisson, PharmD Clinical Pharmacist **Pharmacist phone directory can now be found on [email or web address] ([email or web address]).  Listed under [facility].

## 2024-01-25 NOTE — Progress Notes (Signed)
 Patient lying in bed c/o chronic back pain, noted ^ work of breathing with sats 93-95% on 2L per [place], patient has wheezing and bibasilar rales.  Dr. Lee notified with new orders for nebulizer and PCXR.  IVF stopped per orders.

## 2024-01-25 NOTE — Progress Notes (Signed)
 Patient given a Boost protein supplement drink instead of ensure because patient likes berry flavored drink. Patient drank Boost supplement.

## 2024-01-25 NOTE — TOC Initial Note (Addendum)
 Transition of Care [facility]) - Initial/Assessment Note    Patient Details  Name: [name] MRN: [id number] Date of Birth: 14-Jul-1940  [facility] CM/SW Contact:    Isaiah Public, LCSWA Phone Number: 01/25/2024, 12:01 PM  Clinical Narrative:                  CSW received consult for possible SNF placement at time of discharge. CSW spoke with patient and with patients permission patients son Harman and daughter-n-law [name] at bedside regarding PT recommendation of SNF placement at time of discharge. Patient reports PTA she comes from home alone. Patient with patients son Harman and daughter-n-law [name] expressed understanding of PT recommendation and politely declined  SNF placement at time of discharge. Patient reports preference for CIR . If patient not a candidate for CIR patient would prefer to return home with HHPT. CSW informed [place] with CIR. All questions answered. No further questions reported at this time. CSW to continue to follow and assist with discharge planning needs.   Update- CSW received message from RN patient and family have questions about SNF.  CSW spoke with patient and patients daughter-n-law [name] and son Harman at bedside. Patient now agreeable to SNF. First choice is [facility]. Patient gave CSW permission to fax out initial referral for SNF. All questions answered. No further questions reported at this time.  Update- CSW requested for Jon CMA to start insurance authorization for SNF for patient.  Update- Patients insurance auth currently pending Mayme Barrows PI#[id number].   Expected Discharge Plan:  (TBD patient wants CIR and if not CIR wants to return home with HHPT) Barriers to Discharge: Continued Medical Work up   Patient Goals and CMS Choice Patient states their goals for this hospitalization and ongoing recovery are:: patient wants CIR and if not able would like for patient to return home with HHPT CMS Medicare.gov Compare Post Acute Care  list provided to:: Patient Choice offered to / list presented to : Patient, Adult Children (patient, DIL [name] and son Harman at bedside) [place] ownership interest in [facility].provided to::  (NA)    Expected Discharge Plan and Services In-house Referral: Clinical Social Work Discharge Planning Services: NA Post Acute Care Choice: Home Health Living arrangements for the past 2 months: Single Family Home                 DME Arranged: N/A DME Agency: NA       HH Arranged: PT, OT HH Agency: [facility] Date HH Agency Contacted: 01/20/24 Time HH Agency Contacted: [phone] Representative spoke with at [facility] Agency: Sarah  Prior Living Arrangements/Services Living arrangements for the past 2 months: Single Family Home Lives with:: Self Patient language and need for interpreter reviewed:: Yes Do you feel safe going back to the place where you live?:  (would like CIR if not would like to return home with HHPT)      Need for Family Participation in Patient Care: Yes (Comment) Care giver support system in place?: Yes (comment) Current home services: DME (Wheelchair, RW) Criminal Activity/Legal Involvement Pertinent to Current Situation/Hospitalization: No - Comment as needed  Activities of Daily Living   ADL Screening (condition at time of admission) Independently performs ADLs?: Yes (appropriate for developmental age) Is the patient deaf or have difficulty hearing?: No Does the patient have difficulty seeing, even when wearing glasses/contacts?: No Does the patient have difficulty concentrating, remembering, or making decisions?: No  Permission Sought/Granted Permission sought to share information  with : Oceanographer granted to share information with : Yes, Verbal Permission Granted  Share Information with NAME: [name] and [name]  Permission granted to share info w AGENCY: CIR and HH  Permission granted to share info w Relationship:  son and DIL [name]  Permission granted to share info w Contact Information: Harman (son) [phone] [name] Snider [place]) [phone]  Emotional Assessment Appearance:: Appears stated age Attitude/Demeanor/Rapport: Gracious Affect (typically observed): Calm Orientation: : Oriented to Self, Oriented to Place, Oriented to Situation Alcohol / Substance Use: Not Applicable Psych Involvement: No (comment)  Admission diagnosis:  Orthostatic hypotension [I95.1] Orthostasis [I95.1] Contusion of left knee, initial encounter [S80.02XA] Facial laceration, initial encounter [S01.81XA] Contusion of face, initial encounter [S00.83XA] Patient Active Problem List   Diagnosis Date Noted   Orthostasis 01/15/2024   Hyponatremia 01/15/2024   Leukocytosis 01/15/2024   Aortic atherosclerosis (HCC) 05/25/2023   Chronic obstructive pulmonary disease (HCC) 05/25/2023   Osteoporosis 03/11/2023   Encounter for monitoring opioid maintenance therapy 03/26/2020   Loss of weight 01/23/2019   Adnexal mass 01/13/2019   Chronic pain syndrome 11/08/2018   Abnormal CT of the abdomen 06/23/2018   Gastric wall thickening 06/23/2018   History of colonic polyps 04/27/2018   History of colon cancer 04/27/2018   Family hx of colon cancer 04/27/2018   Early satiety 09/20/2017   Lumbar spondylosis 03/24/2017   Malignant neoplasm of colon (HCC) 01/21/2017   Sciatica of right side 03/06/2015   Knee pain, left 03/13/2014   Muscle weakness (generalized) 01/10/2014   Difficulty walking 01/10/2014   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   Sciatica of left side 04/26/2013   Chest pain at rest 03/21/2013   Prediabetes 12/30/2012   Paroxysmal atrial fibrillation (HCC)    Malignant neoplasm of colon (HCC) 06/24/2009   GASTROESOPHAGEAL REFLUX DISEASE 06/24/2009   Hyperlipidemia 06/12/2009   PCP:  Alphonsa Glendia LABOR, MD Pharmacy:   [facility] - [place], [place] - [address]  [place] [address] Phone: [phone] Fax: [phone]     Social Drivers of Health (SDOH) Social History: SDOH Screenings   Food Insecurity: No Food Insecurity (01/15/2024)  Housing: Low Risk  (01/15/2024)  Transportation Needs: No Transportation Needs (01/15/2024)  Utilities: Not At Risk (01/15/2024)  Alcohol Screen: Low Risk  (09/18/2022)  Depression (PHQ2-9): Low Risk  (12/13/2023)  Financial Resource Strain: Low Risk  (09/18/2022)  Physical Activity: Sufficiently Active (09/18/2022)  Social Connections: Moderately Isolated (01/15/2024)  Stress: No Stress Concern Present (09/18/2022)  Tobacco Use: Medium Risk (01/16/2024)   SDOH Interventions:     Readmission Risk Interventions     No data to display

## 2024-01-25 NOTE — Progress Notes (Signed)
 PROGRESS NOTE    [name]  FMW:[id number] DOB: 02-08-1941 DOA: 01/15/2024 PCP: Alphonsa Glendia LABOR, MD   Brief Narrative:  [name] is a 83 y.o. female with medical history significant of PAF on anticoagulation and flecainide , ostoporosis, hyperlipidemia, chronic pain on opiates, long hystory of orthostasis and has been on midodrine  intermittently over the last year presented with fall.  She was admitted to hospitalist service and [facility] for evaluation of fall due to orthostasis.  Due to ventricular tachycardia, seen by cardiology there, they recommended cardiac cath, patient transferred to [facility] for that.  Assessment & Plan:   Principal Problem:   Orthostasis Active Problems:   Paroxysmal atrial fibrillation (HCC)   Osteoporosis   Hyponatremia   Leukocytosis  Orthostasis S/p Fall, head trauma- -Long h/o orthostasis, complicated by dehydration.  She is on midodrine  5 mg 3 times daily PTA.  However her blood pressure has remained high, with systolic anywhere between 140-160 for most part.  Placed holding parameters to midodrine . - 2D echo demonstrating preserved ejection fraction, no wall motion normalities and no significant valvular disorder. - Continue as needed analgesics.  Will check orthostatics every shift.   Ventricular tachycardia- Given ongoing changes and intermittent no sustained episodes of ventricular tachycardia decision has been made to proceed with cardiac cath for further evaluation.  Status postcardiac cath 01/21/2024 with normal coronary arteries. cMRI completed 01/24/2024 with LVEF 59% and no wall motion abnormalities. No evidence of infiltrative disease.   Paroxysmal atrial fibrillation - On Eliquis  and flecainide  PTA, transitioned to heparin  for now per cardiology.  Flecainide  discontinued.  She has now been started on amiodarone  on 01/24/2024.  Rates controlled.   AKI on CKD stage IIIa/dehydration: Patient's creatinine varies and ranges anywhere  from 0.8-1.2.  It peaked at 1.67, has continued to improve ever since and is back down to 1.2 which is her baseline.  Repeating labs today.   Hyponatremia In the setting of dehydration, likely hypovolemic, improved to 130 from 127 yesterday and has remained stable compared to yesterday.  Hypokalemia: 3.0, replenished.  Chronic back pain: Resumed her home dose of hydrocodone  medications which she takes every 6 hours as needed.  She still complains of back pain however she did not cry last night for extra IV pain medications.  She was lethargic this morning.  Nurse was at the bedside as well.  Discussed with the patient as well as the nurse about my concern about her lethargy which is likely secondary to opioids, we will absolutely not escalate pain medications anymore.   Leukocytosis/?  Aspiration pneumonia: Initial chest x-ray showed possible bibasilar opacities, atypical infection but pulmonary edema as well.  Patient had no respiratory symptoms until last evening when she started coughing.  Leukocytosis worse however she remains afebrile.  Repeat chest x-ray 01/24/2024 was done due to respiratory distress and hypoxia which shows pulmonary edema and pleural effusion.  Due to worsening leukocytosis, concerned about possible aspiration pneumonia since patient had a fall and syncope and has been in and out of lethargy, raising concern for possible aspiration.  Will start this patient on Unasyn  today.    Volume overload/acute on chronic congestive heart failure with preserved ejection fraction: Overnight on 01/24/2024, patient became hypoxic with respiratory distress.  Chest x-ray was completed which showed worsening CHF and bilateral pleural effusion and BNP was also elevated.  Patient was given 1 dose of IV Lasix  20 mg.  Still hypoxic, has crackles.  Will give another 20 mg of  IV Lasix  today.  Large bruise/hematoma under left eye: This happened a week ago due to fall.  Nurses have been advised to apply  cool compresses every 2-3 hours.  This appears to be improving gradually.   Debility- Generalized weakness- -Due to poor oral intake. -Lives alone, seen by PT OT, they recommend discharge home with home health however I was concerned about her safety, reconsulted PT OT, they have now updated their recommendation to SNF.  TOC on board.  GOC: Patient was very much alert yesterday, she clearly told me that if she were to be near the death, she would like to go home and die.  Palliative care was consulted.  However I spoke to her personally this morning, she said that palliative care had already talked to her and she said she thinks she is nearing death and would not want to live on the machine.  I tried to ask her to clarify CODE STATUS as she is full code at the moment.  She kept saying  I do not know what to answer you but I just do not want to be living off of machines at the end of the conversation, she could not make any firm decision.  Palliative care team tried to talk to the family however they are not receptive of the ideas of comfort care or event DNR, they want patient to be full code at the moment.  Disposition/family discussion: I revisited patient again around 1 PM when patient's son and daughter-in-law were at the bedside.  I spoke to them face-to-face for about 15 minutes.  Updated them about patient's current condition.  Unfortunately, they blamed hospital for patient's pulmonary edema and pneumonia as well as deconditioning and generalized weakness since she has been in the hospital for so long and they blamed that PT has not seen her as much as she should have been.  I tried to explain that patient was taking too many pain medications due to back pain and was sedated and was not able to wake up for PT to work with her and that has caused more deconditioning on top of her debility already.  PT OT has recommended CIR but unfortunately, she does not meet criteria for CIR as far as the  diagnosis goes.  I discussed this with the family in length as well.  Per them, they are going to try to talk to [place] [facility]/patient's insurance company and find out about that.  I offered for the patient to go to SNF and in fact highly recommended that.  They want the patient to go to home instead of SNF as  SNF is useless, according to them.  I reiterated that I am worried that if patient were to go home with this much of deconditioning, she will not do well and end up getting sicker and needing rehospitalization.  Despite of my extensive counseling, they are not flexible about thinking about SNF.  Patient was very much alert in the afternoon.  No crackles on exam as I examined her again.  She is not requiring any oxygen, not hypoxic.  Heart rate controlled.  Other than generalized weakness, there is not much that patient need to be in the hospital for.  I informed the family that if patient were to remain stable as she is today by tomorrow, we will move forward with discharging home as that is their choice.  They were in agreement.  DVT prophylaxis: apixaban  (ELIQUIS ) tablet 2.5 mg Start: 01/24/24 2200Heparin  Code Status: Full Code  Family Communication: Son and daughter-in-law present at bedside.  Status is: Inpatient Remains inpatient appropriate because: Cardiology plans cardiac MRI and EP to see today as well.       Estimated body mass index is 20.6 kg/m as calculated from the following:   Height as of this encounter: 5' 2 (1.575 m).   Weight as of this encounter: 51.1 kg.    Nutritional Assessment: Body mass index is 20.6 kg/m.SABRA Seen by dietician.  I agree with the assessment and plan as outlined below: Nutrition Status: Nutrition Problem: Inadequate oral intake Etiology: decreased appetite, acute illness Signs/Symptoms: per patient/family report Interventions: Ensure Enlive (each supplement provides 350kcal and 20 grams of protein)  . Skin Assessment: I have examined the  patient's skin and I agree with the wound assessment as performed by the wound care RN as outlined below:    Consultants:  Cardiology  Procedures:  Above  Antimicrobials:  Anti-infectives (From admission, onward)    Start     Dose/Rate Route Frequency Ordered Stop   01/24/24 1245  ceFAZolin  (ANCEF ) IVPB 1 g/50 mL premix  Status:  Discontinued        1 g 100 mL/hr over 30 Minutes Intravenous Every 8 hours 01/24/24 1156 01/24/24 1322   01/24/24 1000  ceFAZolin  (ANCEF ) IVPB 1 g/50 mL premix  Status:  Discontinued        1 g 100 mL/hr over 30 Minutes Intravenous Every 8 hours 01/24/24 0809 01/24/24 1156         Subjective: Patient seen and examined twice today.  She was more alert in the afternoon.  Patient denied any complaints other than chronic back pain.  Objective: Vitals:   01/24/24 1738 01/24/24 1940 01/24/24 2318 01/25/24 0427  BP: (!) 142/59 (!) 165/58 (!) 138/57 (!) 143/54  Pulse: 66 (!) 57 77 79  Resp: 18 16 20 16   Temp: 98.1 F (36.7 C) 98.6 F (37 C) 98.1 F (36.7 C) 98.9 F (37.2 C)  TempSrc: Oral Oral Oral Oral  SpO2: 98% 98% 97% 99%  Weight:      Height:        Intake/Output Summary (Last 24 hours) at 01/25/2024 0759 Last data filed at 01/25/2024 0429 Gross per 24 hour  Intake 975.21 ml  Output 950 ml  Net 25.21 ml   Filed Weights   01/15/24 2210 01/20/24 2223 01/21/24 0852  Weight: 47.7 kg 51.1 kg 51.1 kg    Examination: , General exam: Appears calm and comfortable, large hematoma at the left maxillofacial area Respiratory system:  diminished breath sounds at the bases bilaterally. Cardiovascular system: S1 & S2 heard, RRR. No JVD, murmurs, rubs, gallops or clicks. No pedal edema. Gastrointestinal system: Abdomen is nondistended, soft and nontender. No organomegaly or masses felt. Normal bowel sounds heard. Central nervous system: Alert and oriented. No focal neurological deficits. Extremities: Symmetric 5 x 5 power. Skin: No rashes, lesions  or ulcers.  Psychiatry: Judgement and insight appear normal. Mood & affect appropriate.   Data Reviewed: I have personally reviewed following labs and imaging studies  CBC: Recent Labs  Lab 01/21/24 1607 01/22/24 0536 01/23/24 0531 01/24/24 0552 01/25/24 0108  WBC 19.7* 16.4* 18.2* 17.2* 20.2*  HGB 9.2* 9.2* 8.0* 7.5* 7.9*  HCT 27.2* 28.4* 24.8* 22.9* 24.6*  MCV 100.4* 102.5* 101.6* 101.3* 101.7*  PLT 229 264 222 244 302   Basic Metabolic Panel: Recent Labs  Lab 01/19/24 0443 01/20/24 0541 01/21/24 0430 01/21/24 0652 01/21/24 1455  01/21/24 1504 01/21/24 1607 01/22/24 0536 01/23/24 0531 01/24/24 0552  NA 131* 130* 130* 127* 126* 127*  --  130* 130* 140  K 4.3 4.4 4.6 4.3 4.7 4.4  --  4.8 3.7 3.6  CL 101 96* 94* 96*  --   --   --  95* 103 111  CO2 22 23 21* 21*  --   --   --  20* 17* 17*  GLUCOSE 122* 131* 113* 111*  --   --   --  93 121* 117*  BUN 22 22 29* 29*  --   --   --  33* 30* 22  CREATININE 1.07* 1.10* 1.66* 1.59*  --   --  1.67* 1.61* 1.47* 1.23*  CALCIUM  8.0* 7.9* 8.1* 7.7*  --   --   --  7.9* 7.2* 7.5*  MG 1.9 1.9 1.9  --   --   --   --   --   --   --   PHOS 3.4 3.3 2.9  --   --   --   --   --   --   --    GFR: Estimated Creatinine Clearance: 28.9 mL/min (A) (by C-G formula based on SCr of 1.23 mg/dL (H)). Liver Function Tests: No results for input(s): AST, ALT, ALKPHOS, BILITOT, PROT, ALBUMIN in the last 168 hours.  No results for input(s): LIPASE, AMYLASE in the last 168 hours. No results for input(s): AMMONIA in the last 168 hours. Coagulation Profile: No results for input(s): INR, PROTIME in the last 168 hours. Cardiac Enzymes: No results for input(s): CKTOTAL, CKMB, CKMBINDEX, TROPONINI in the last 168 hours. BNP (last 3 results) No results for input(s): PROBNP in the last 8760 hours. HbA1C: No results for input(s): HGBA1C in the last 72 hours. CBG: No results for input(s): GLUCAP in the last 168  hours. Lipid Profile: No results for input(s): CHOL, HDL, LDLCALC, TRIG, CHOLHDL, LDLDIRECT in the last 72 hours. Thyroid  Function Tests: No results for input(s): TSH, T4TOTAL, FREET4, T3FREE, THYROIDAB in the last 72 hours.  Anemia Panel: No results for input(s): VITAMINB12, FOLATE, FERRITIN, TIBC, IRON, RETICCTPCT in the last 72 hours. Sepsis Labs: No results for input(s): PROCALCITON, LATICACIDVEN in the last 168 hours.  No results found for this or any previous visit (from the past 240 hours).   Radiology Studies: DG CHEST PORT 1 VIEW Result Date: 01/24/2024 CLINICAL DATA:  Shortness of breath EXAM: PORTABLE CHEST 1 VIEW COMPARISON:  01/23/2024 FINDINGS: Cardiac shadow is stable. Increasing bilateral pleural effusions are noted. Increasing central vascular congestion with interstitial edema is noted as well. No bony abnormality is noted. IMPRESSION: Worsening CHF with bilateral effusions. Electronically Signed   By: Oneil Devonshire M.D.   On: 01/24/2024 22:50   MR CARDIAC MORPHOLOGY W WO CONTRAST Result Date: 01/24/2024 CLINICAL DATA:  Ventricular tachycardia (VT), sustained EXAM: MR CARDIA MORPHOLOGY WITHOUT AND WITH CONTRAST; MR CARDIAC VELOCITY FLOW MAPPING TECHNIQUE: The patient was scanned on a 1.5 Tesla Siemens magnet. A dedicated cardiac coil was used. Functional imaging was done using TrueFisp sequences. 2,3, and 4 chamber views were done to assess for RWMA's. Modified Simpson's rule using a short axis stack was used to calculate an ejection fraction on a dedicated work [employer]. The patient received 5mL GADAVIST  GADOBUTROL  1 MMOL/ML IV SOLN. After 10 minutes inversion recovery sequences were used to assess for infiltration and scar tissue. Phase contrast velocity encoded images obtained x 2. This examination is tailored for  evaluation cardiac anatomy and function and provides very limited assessment of noncardiac structures, which  are accordingly not evaluated during interpretation. If there is clinical concern for extracardiac pathology, further evaluation with CT imaging should be considered. FINDINGS: LEFT VENTRICLE: Normal left ventricular size. Maximum septal wall thickness: 0.7 cm. Posterior wall thickness 0.7 cm. Left ventricular internal diameter (diastole): 4.8 cm. There are no regional wall motion abnormalities. LV EF: 59% (Normal 52-79%) Absolute volumes: LV EDV: 93 mL (Normal 78-167 mL) LV ESV: 38 mL (Normal 21-64 mL) LV SV: 55 mL (Normal 52-114 mL) CO: 5.4 L/min (Normal 2.7-6.3 L/min) Indexed volumes: LV EDV: 62 mL/sq-m (Normal 50-96 mL/sq-m) LV ESV: 25 mL/sq-m (Normal 10-40 mL/sq-m) LV SV: 37 mL/sq-m (Normal 33-64 mL/sq-m) CI: 3.6 L/min/sq-m (Normal 1.9-3.9 L/min/sq-m) RIGHT VENTRICLE: Normal right ventricular chamber size. Normal right ventricular wall thickness. Normal right ventricular systolic function. There are no regional wall motion abnormalities. RV EF: 54% (Normal 52-80%) Absolute volumes: RV EDV: 95 mL (Normal 79-175 mL) RV ESV: 44 mL (Normal 13-75 mL) RV SV: 52 mL (Normal 56-110 mL) CO: 5.1 L/min (Normal 2.7-6 L/min) Indexed volumes: RV EDV: 64 mL/sq-m (Normal 51-97 mL/sq-m) RV ESV: 29 mL/sq-m (Normal 9-42 mL/sq-m) RV SV: 35 mL/sq-m (Normal 35-61 mL/sq-m) CI: 3.4 L/min/sq-m (Normal 1.8-3.8 L/min/sq-m) Left atrium: Normal size. Right atrium: Normal size. Mitral valve: Normal mitral valve without regurgitation. Aortic valve: Grossly normal without regurgitation. Tricuspid valve: Normal tricuspid valve with mild regurgitation. Pulmonic valve: Normal pulmonary valve without regurgitation. Aorta: The aortic root and proximal ascending aorta are normal in diameter. Pericardium: The pericardium is normal thickness. Trivial pericardial effusion. There is no evidence of intracardiac thrombus. Systemic flow across the aortic valve (QS) was 4.5 L/min. Pulmonary flow across the pulmonic valve (QP) was 5.1  L/min. The QP:QS  calculated across the aortic and pulmonic valves was 1.1. Native myocardial T1-relaxation times were normal at 1010 ms. Delayed Enhancement: No delayed enhancement. No evidence of infarction, inflammation, or infiltrative disorder. Extracardiac structures: Mild bilateral pleural effusions noted. IMPRESSION: 1. The left ventricle is normal in cavity size and wall thickness. Systolic function was normal with LVEF 59%. No regional wall motion abnormalities. 2. The right ventricle is normal in cavity size and systolic function. 3. No significant valvular heart disease. 4. Delayed enhancement imaging demonstrates no evidence of myocardial infarction, inflammation, or infiltrative disorder. 5. Mild bilateral pleural effusions noted. Darryle Decent, MD Electronically Signed   By: Darryle Decent M.D.   On: 01/24/2024 14:56   MR CARDIAC VELOCITY FLOW MAP Result Date: 01/24/2024 CLINICAL DATA:  Ventricular tachycardia (VT), sustained EXAM: MR CARDIA MORPHOLOGY WITHOUT AND WITH CONTRAST; MR CARDIAC VELOCITY FLOW MAPPING TECHNIQUE: The patient was scanned on a 1.5 Tesla Siemens magnet. A dedicated cardiac coil was used. Functional imaging was done using TrueFisp sequences. 2,3, and 4 chamber views were done to assess for RWMA's. Modified Simpson's rule using a short axis stack was used to calculate an ejection fraction on a dedicated work [employer]. The patient received 5mL GADAVIST  GADOBUTROL  1 MMOL/ML IV SOLN. After 10 minutes inversion recovery sequences were used to assess for infiltration and scar tissue. Phase contrast velocity encoded images obtained x 2. This examination is tailored for evaluation cardiac anatomy and function and provides very limited assessment of noncardiac structures, which are accordingly not evaluated during interpretation. If there is clinical concern for extracardiac pathology, further evaluation with CT imaging should be considered. FINDINGS: LEFT VENTRICLE: Normal left  ventricular size. Maximum septal wall thickness: 0.7  cm. Posterior wall thickness 0.7 cm. Left ventricular internal diameter (diastole): 4.8 cm. There are no regional wall motion abnormalities. LV EF: 59% (Normal 52-79%) Absolute volumes: LV EDV: 93 mL (Normal 78-167 mL) LV ESV: 38 mL (Normal 21-64 mL) LV SV: 55 mL (Normal 52-114 mL) CO: 5.4 L/min (Normal 2.7-6.3 L/min) Indexed volumes: LV EDV: 62 mL/sq-m (Normal 50-96 mL/sq-m) LV ESV: 25 mL/sq-m (Normal 10-40 mL/sq-m) LV SV: 37 mL/sq-m (Normal 33-64 mL/sq-m) CI: 3.6 L/min/sq-m (Normal 1.9-3.9 L/min/sq-m) RIGHT VENTRICLE: Normal right ventricular chamber size. Normal right ventricular wall thickness. Normal right ventricular systolic function. There are no regional wall motion abnormalities. RV EF: 54% (Normal 52-80%) Absolute volumes: RV EDV: 95 mL (Normal 79-175 mL) RV ESV: 44 mL (Normal 13-75 mL) RV SV: 52 mL (Normal 56-110 mL) CO: 5.1 L/min (Normal 2.7-6 L/min) Indexed volumes: RV EDV: 64 mL/sq-m (Normal 51-97 mL/sq-m) RV ESV: 29 mL/sq-m (Normal 9-42 mL/sq-m) RV SV: 35 mL/sq-m (Normal 35-61 mL/sq-m) CI: 3.4 L/min/sq-m (Normal 1.8-3.8 L/min/sq-m) Left atrium: Normal size. Right atrium: Normal size. Mitral valve: Normal mitral valve without regurgitation. Aortic valve: Grossly normal without regurgitation. Tricuspid valve: Normal tricuspid valve with mild regurgitation. Pulmonic valve: Normal pulmonary valve without regurgitation. Aorta: The aortic root and proximal ascending aorta are normal in diameter. Pericardium: The pericardium is normal thickness. Trivial pericardial effusion. There is no evidence of intracardiac thrombus. Systemic flow across the aortic valve (QS) was 4.5 L/min. Pulmonary flow across the pulmonic valve (QP) was 5.1  L/min. The QP:QS calculated across the aortic and pulmonic valves was 1.1. Native myocardial T1-relaxation times were normal at 1010 ms. Delayed Enhancement: No delayed enhancement. No evidence of infarction, inflammation, or  infiltrative disorder. Extracardiac structures: Mild bilateral pleural effusions noted. IMPRESSION: 1. The left ventricle is normal in cavity size and wall thickness. Systolic function was normal with LVEF 59%. No regional wall motion abnormalities. 2. The right ventricle is normal in cavity size and systolic function. 3. No significant valvular heart disease. 4. Delayed enhancement imaging demonstrates no evidence of myocardial infarction, inflammation, or infiltrative disorder. 5. Mild bilateral pleural effusions noted. Darryle Decent, MD Electronically Signed   By: Darryle Decent M.D.   On: 01/24/2024 14:56   MR CARDIAC VELOCITY FLOW MAP Result Date: 01/24/2024 CLINICAL DATA:  Ventricular tachycardia (VT), sustained EXAM: MR CARDIA MORPHOLOGY WITHOUT AND WITH CONTRAST; MR CARDIAC VELOCITY FLOW MAPPING TECHNIQUE: The patient was scanned on a 1.5 Tesla Siemens magnet. A dedicated cardiac coil was used. Functional imaging was done using TrueFisp sequences. 2,3, and 4 chamber views were done to assess for RWMA's. Modified Simpson's rule using a short axis stack was used to calculate an ejection fraction on a dedicated work [employer]. The patient received 5mL GADAVIST  GADOBUTROL  1 MMOL/ML IV SOLN. After 10 minutes inversion recovery sequences were used to assess for infiltration and scar tissue. Phase contrast velocity encoded images obtained x 2. This examination is tailored for evaluation cardiac anatomy and function and provides very limited assessment of noncardiac structures, which are accordingly not evaluated during interpretation. If there is clinical concern for extracardiac pathology, further evaluation with CT imaging should be considered. FINDINGS: LEFT VENTRICLE: Normal left ventricular size. Maximum septal wall thickness: 0.7 cm. Posterior wall thickness 0.7 cm. Left ventricular internal diameter (diastole): 4.8 cm. There are no regional wall motion abnormalities. LV EF: 59% (Normal  52-79%) Absolute volumes: LV EDV: 93 mL (Normal 78-167 mL) LV ESV: 38 mL (Normal 21-64 mL) LV SV: 55 mL (Normal 52-114 mL) CO:  5.4 L/min (Normal 2.7-6.3 L/min) Indexed volumes: LV EDV: 62 mL/sq-m (Normal 50-96 mL/sq-m) LV ESV: 25 mL/sq-m (Normal 10-40 mL/sq-m) LV SV: 37 mL/sq-m (Normal 33-64 mL/sq-m) CI: 3.6 L/min/sq-m (Normal 1.9-3.9 L/min/sq-m) RIGHT VENTRICLE: Normal right ventricular chamber size. Normal right ventricular wall thickness. Normal right ventricular systolic function. There are no regional wall motion abnormalities. RV EF: 54% (Normal 52-80%) Absolute volumes: RV EDV: 95 mL (Normal 79-175 mL) RV ESV: 44 mL (Normal 13-75 mL) RV SV: 52 mL (Normal 56-110 mL) CO: 5.1 L/min (Normal 2.7-6 L/min) Indexed volumes: RV EDV: 64 mL/sq-m (Normal 51-97 mL/sq-m) RV ESV: 29 mL/sq-m (Normal 9-42 mL/sq-m) RV SV: 35 mL/sq-m (Normal 35-61 mL/sq-m) CI: 3.4 L/min/sq-m (Normal 1.8-3.8 L/min/sq-m) Left atrium: Normal size. Right atrium: Normal size. Mitral valve: Normal mitral valve without regurgitation. Aortic valve: Grossly normal without regurgitation. Tricuspid valve: Normal tricuspid valve with mild regurgitation. Pulmonic valve: Normal pulmonary valve without regurgitation. Aorta: The aortic root and proximal ascending aorta are normal in diameter. Pericardium: The pericardium is normal thickness. Trivial pericardial effusion. There is no evidence of intracardiac thrombus. Systemic flow across the aortic valve (QS) was 4.5 L/min. Pulmonary flow across the pulmonic valve (QP) was 5.1  L/min. The QP:QS calculated across the aortic and pulmonic valves was 1.1. Native myocardial T1-relaxation times were normal at 1010 ms. Delayed Enhancement: No delayed enhancement. No evidence of infarction, inflammation, or infiltrative disorder. Extracardiac structures: Mild bilateral pleural effusions noted. IMPRESSION: 1. The left ventricle is normal in cavity size and wall thickness. Systolic function was normal with LVEF 59%. No  regional wall motion abnormalities. 2. The right ventricle is normal in cavity size and systolic function. 3. No significant valvular heart disease. 4. Delayed enhancement imaging demonstrates no evidence of myocardial infarction, inflammation, or infiltrative disorder. 5. Mild bilateral pleural effusions noted. Darryle Decent, MD Electronically Signed   By: Darryle Decent M.D.   On: 01/24/2024 14:56   DG CHEST PORT 1 VIEW Result Date: 01/23/2024 CLINICAL DATA:  Aspiration pneumonia EXAM: PORTABLE CHEST 1 VIEW COMPARISON:  01/15/2024 FINDINGS: Atherosclerotic calcification of the aortic arch. Mild thoracic spondylosis. Accentuated markings and interstitial opacity at the lung bases and perihilar regions. Mild central airway thickening. Both increased from prior. Upper normal heart size. No blunting of the lateral costophrenic angles. IMPRESSION: 1. Accentuated markings and interstitial opacity at the lung bases and perihilar regions, increased from prior. This could be from atypical pneumonia, aspiration pneumonitis, or mild interstitial edema. 2. Upper normal heart size. 3. Aortic Atherosclerosis (ICD10-I70.0). Electronically Signed   By: Ryan Salvage M.D.   On: 01/23/2024 12:14    Scheduled Meds:  amiodarone   200 mg Oral BID   apixaban   2.5 mg Oral BID   feeding supplement  237 mL Oral TID BM   lactulose   20 g Oral QHS   midodrine   5 mg Oral TID WC   pantoprazole   40 mg Oral Daily   QUEtiapine   25 mg Oral Q24H   rosuvastatin   40 mg Oral Daily   sodium chloride  flush  3 mL Intravenous Q12H   Continuous Infusions:     LOS: 4 days   Fredia Skeeter, MD [facility]  01/25/2024, 7:59 AM   *Please note that this is a verbal dictation therefore any spelling or grammatical errors are due to the Dragon Medical One system interpretation.  Please page via Amion and do not message via secure chat for urgent patient care matters. Secure chat can be used for non urgent patient care  matters.  How to contact the [facility] Attending or Consulting provider 7A - 7P or covering provider during after hours 7P -7A, for this patient?  Check the care team in [facility] and look for a) attending/consulting [facility] provider listed and b) the [facility] team listed. Page or secure chat 7A-7P. Log into [email or web address] and use [facility]'s universal password to access. If you do not have the password, please contact the hospital operator. Locate the [facility] provider you are looking for under [facility] and page to a number that you can be directly reached. If you still have difficulty reaching the provider, please page the [facility] (Director on Call) for the Hospitalists listed on amion for assistance.

## 2024-01-25 NOTE — Progress Notes (Signed)
 PCXR>Worsening CHF with bilateral effusions. MD notified with orders to give lasix  20 mg IV.

## 2024-01-25 NOTE — Progress Notes (Signed)
 Patient had a large amount of urinary output in brief, sats 98% on 3L. Patient cleaned and new brief applied.  Turned to right side and resting with eyes closed.  Noted patient has also had 2 brief episodes of atrial fibrillation and currently  in sinus rhythm in the 70s.

## 2024-01-25 NOTE — Consult Note (Signed)
 Palliative Medicine Inpatient Consult Note  Consulting Provider:  Vernon Ranks, MD   Reason for consult:   Palliative Care Consult Services Palliative Medicine Consult  Reason for Consult? GOC/patient with multiple medical problems   01/25/2024  HPI:  Per intake H&P --> [name] is a 83 y.o. female with medical history significant of PAF on anticoagulation and flecainide , ostoporosis, hyperlipidemia, chronic pain on opiates, long hystory of orthostasis and has been on midodrine  intermittently over the last year presented with fall.  (+) Ventricular tachycardia - transitioned to [facility] for cardiac catheterization. Palliative care requested for goals of care.   Clinical Assessment/Goals of Care:  *Please note that this is a verbal dictation therefore any spelling or grammatical errors are due to the Dragon Medical One system interpretation.  I have reviewed medical records including EPIC notes, labs and imaging, received report from bedside RN, assessed the patient who is rather fatigued as she had just worked with the PTA and gotten into the chair.    I met with Donny Ada to further discuss diagnosis prognosis, GOC, EOL wishes, disposition and options. I called Meika's DIL on Speakerphone thereafter.   I introduced Palliative Medicine as specialized medical care for people living with serious illness. It focuses on providing relief from the symptoms and stress of a serious illness. The goal is to improve quality of life for both the patient and the family.  Medical History Review and Understanding:  A review of [name]'s past medical history inclusive of Afib, HLD, chronic lower back pain for > 7 years managed by primary care, orthostatics, arthritis, colon cancer (Stg I resected in 03/2005).   Social History:  [name] lives in [place], [place] . She was married for 49 years though her spouse has passed away. 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 She shares that she use to enjoy frequent travel, shopping, and televises. She enjoys being around friends and family. She is a woman of faith and goes to the [employer]  Functional and Nutritional State:  [name] shares that prior to admission was doing all bADLs on her own. She notes the prolonged hospital stay has really taken a lot out of her.   Advance Directives:  A detailed discussion was had today regarding advanced directives.  [name] shares that she has these documents, they are at her home.   Code Status:  Concepts specific to code status, artifical feeding and hydration, continued IV antibiotics and rehospitalization was had.  The difference between a aggressive medical intervention path  and a palliative comfort care path for this patient at this time was had.   Encouraged patient/family to consider DNR/DNI status understanding evidenced based poor outcomes in similar hospitalized patient, as the cause of arrest is likely associated with advanced chronic/terminal illness rather than an easily reversible acute cardio-pulmonary event. I explained that DNR/DNI does not change the medical plan and it only comes into effect after a person has arrested (died).  It is a protective measure to keep us  from harming the patient in their last moments of life.   [name] shares that she is ready to go on when it is her time. She shares that she does not want life supportive measures. Patients son, [name] and DIL, [name] were called so that they were hearing this information as well. [name] disagrees with this and shares that patients chest should be compressed on.  I tried explaining that cardiopulmonary resuscitation is a multifactorial process and if we are pursuing this we have to pursue all of it.   Discussion:  In speaking with [name] and her family it seems there are great concerns associated with the involvement of the  Palliative care team. [name] does not feel this directly towards the inpatient team but notes she has had bad experiences with [employer] in general. She notes that they are ageist and there have been tragic outcomes from her perspective regarding the care of other loved ones. I allowed her time and space to express herself. She and I reviewed that our involvement is certainly stirring up complex emotions. I shared I am happy to remove myself from the patients care but I will leave information on Palliative care and CPR for their review as a family.  I recommended they provide a copy of AD's to staff.  I provided my care as well for them to call me if needed in the future.   Shardee and I spoke more after the phone was hung up. She shares she would like to get better but she worries the longer she is in this state the harder that will be to attain. Provided her space to express her feelings. She notes since her back surgery years ago she has struggled with pain. She shares her orthostasis is contributing to her feeling profoundly worse and she is not sure what the end result will be.   Discussed the importance of continued conversation with family and their  medical providers regarding overall plan of care and treatment options, ensuring decisions are within the context of the patients values and GOCs.  Decision Maker: [name] (Son): [phone] (Mobile)   SUMMARY OF RECOMMENDATIONS   Full code as of now --> Tried to educate patient and family regarding CPR. Patient shares she does not want to be on life support. Patients family is misinformed and thinks she can just get chest compressions. Will need additional education by providers.   Patients family request that the PMT is taken off of the patients care  Provided information should additional services be desired  Code Status/Advance Care Planning: FULL CODE  Palliative Prophylaxis:  Aspiration, Bowel Regimen, Delirium Protocol,  Frequent Pain Assessment, Oral Care, Palliative Wound Care, and Turn Reposition  Additional Recommendations (Limitations, Scope, Preferences): Continue current care  Psycho-social/Spiritual:  Desire for further Chaplaincy support: No Additional Recommendations: Education on CPR   Prognosis: Limited in the setting of chronic disease burden, deconditioning, and frailty.   Discharge Planning: To be determined.   Vitals:   01/24/24 2318 01/25/24 0427  BP: (!) 138/57 (!) 143/54  Pulse: 77 79  Resp: 20 16  Temp: 98.1 F (36.7 C) 98.9 F (37.2 C)  SpO2: 97% 99%    Intake/Output Summary (Last 24 hours) at 01/25/2024 0847 Last data filed at 01/25/2024 0429 Gross per 24 hour  Intake 975.21 ml  Output 950 ml  Net 25.21 ml   Last Weight  Most recent update: 01/21/2024  8:53 AM    Weight  51.1 kg (112 lb 10.5 oz)             LABS: CBC:    Component Value Date/Time   WBC 20.2 (H) 01/25/2024 0108   HGB 7.9 (L) 01/25/2024 0108   HGB 11.5 08/27/2023 1529   HCT 24.6 (L) 01/25/2024 0108   HCT 34.7 08/27/2023 1529   PLT 302 01/25/2024 0108   PLT 300 08/27/2023 1529   MCV  101.7 (H) 01/25/2024 0108   MCV 102 (H) 08/27/2023 1529   NEUTROABS 12.5 (H) 01/15/2024 1604   NEUTROABS 5.0 08/27/2023 1529   LYMPHSABS 1.5 01/15/2024 1604   LYMPHSABS 3.3 (H) 08/27/2023 1529   MONOABS 1.0 01/15/2024 1604   EOSABS 0.0 01/15/2024 1604   EOSABS 0.2 08/27/2023 1529   BASOSABS 0.0 01/15/2024 1604   BASOSABS 0.0 08/27/2023 1529   Comprehensive Metabolic Panel:    Component Value Date/Time   NA 140 01/24/2024 0552   NA 135 12/29/2023 1344   K 3.6 01/24/2024 0552   CL 111 01/24/2024 0552   CO2 17 (L) 01/24/2024 0552   BUN 22 01/24/2024 0552   BUN 17 12/29/2023 1344   CREATININE 1.23 (H) 01/24/2024 0552   CREATININE 0.94 12/25/2013 1037   GLUCOSE 117 (H) 01/24/2024 0552   CALCIUM  7.5 (L) 01/24/2024 0552   AST 18 01/15/2024 1604   ALT 12 01/15/2024 1604   ALKPHOS 43 01/15/2024 1604    BILITOT 0.7 01/15/2024 1604   BILITOT 0.3 02/23/2023 0823   PROT 6.6 01/15/2024 1604   PROT 6.5 02/23/2023 0823   ALBUMIN 3.9 01/15/2024 1604   ALBUMIN 4.3 02/23/2023 0823   Gen:  Elderly Caucasian F chronically ill in appearance HEENT: moist mucous membranes CV: Regular rate and irregular rhythm  PULM:  On RA, breathing is even and nonlabored ABD: soft/nontender  EXT:  (+) muscular atrophy Neuro: Alert and oriented x3   PPS: 40%   This conversation/these recommendations were discussed with patient primary care team, Dr. Vernon  Total Time: 62 ______________________________________________________ Rosaline Becton [place] Palliative Medicine Team Team Cell Phone: [phone] Please utilize secure chat with additional questions, if there is no response within 30 minutes please call the above phone number  Palliative Medicine Team providers are available by phone from 7am to 7pm daily and can be reached through the team cell phone.  Should this patient require assistance outside of these hours, please call the patient's attending physician.

## 2024-01-25 NOTE — Progress Notes (Addendum)
 Mobility Specialist Progress Note:   01/25/24 1130  Mobility  Activity Pivoted/transferred from chair to bed  Level of Assistance Contact guard assist, steadying assist  Assistive Device Front wheel walker  Distance Ambulated (ft) 5 ft  Activity Response Tolerated well  Mobility Referral Yes  Mobility visit 1 Mobility  Mobility Specialist Start Time (ACUTE ONLY) 1130  Mobility Specialist Stop Time (ACUTE ONLY) 1145  Mobility Specialist Time Calculation (min) (ACUTE ONLY) 15 min   Pt eager to transfer back to bed. Sat up for ~2 hours today. Required only CGA to stand and take steps to bed with RW. HR significantly elevated to 170 with transfer, quickly returned to 90s with seated rest (RN aware). Pt much more alert today, having full conversations. Back in bed with all needs met, alarm on.  [name] Mobility Specialist Please contact via SecureChat or  Rehab office at [phone]

## 2024-01-25 NOTE — Progress Notes (Signed)
 Progress Note  Patient Name: [name] Date of Encounter: 01/25/2024 Primary Cardiologist: Alvan Carrier, MD   Subjective   Overnight reconsulted by IM and EP for volume overload Patient notes new SOB with new pleural effusions s/p 20 IV lasix . Feel better now  Vital Signs    Vitals:   01/25/24 0427 01/25/24 0830 01/25/24 0902 01/25/24 0903  BP: (!) 143/54 (!) 140/54 (!) 140/54   Pulse: 79 80 83 86  Resp: 16 18 18    Temp: 98.9 F (37.2 C)  98.1 F (36.7 C)   TempSrc: Oral  Oral   SpO2: 99% 93% 90% 93%  Weight:      Height:        Intake/Output Summary (Last 24 hours) at 01/25/2024 1007 Last data filed at 01/25/2024 0900 Gross per 24 hour  Intake 975.21 ml  Output 1330 ml  Net -354.79 ml   Filed Weights   01/15/24 2210 01/20/24 2223 01/21/24 0852  Weight: 47.7 kg 51.1 kg 51.1 kg    Physical Exam   GEN: No acute distress.   Neck: No JVD Cardiac: RRR, no murmurs, rubs, or gallops.  Respiratory: Coarse breath sounds bilaterally. GI: Soft, nontender, non-distended  MS: No edema  Labs   Telemetry: SR with narrow complex tachycardia   Chemistry Recent Labs  Lab 01/22/24 0536 01/23/24 0531 01/24/24 0552  NA 130* 130* 140  K 4.8 3.7 3.6  CL 95* 103 111  CO2 20* 17* 17*  GLUCOSE 93 121* 117*  BUN 33* 30* 22  CREATININE 1.61* 1.47* 1.23*  CALCIUM  7.9* 7.2* 7.5*  GFRNONAA 32* 36* 44*  ANIONGAP 15 10 12      Hematology Recent Labs  Lab 01/23/24 0531 01/24/24 0552 01/25/24 0108  WBC 18.2* 17.2* 20.2*  RBC 2.44* 2.26* 2.42*  HGB 8.0* 7.5* 7.9*  HCT 24.8* 22.9* 24.6*  MCV 101.6* 101.3* 101.7*  MCH 32.8 33.2 32.6  MCHC 32.3 32.8 32.1  RDW 12.5 12.9 13.2  PLT 222 244 302     BNP Recent Labs  Lab 01/25/24 0108  BNP 1,072.2*      Cardiac Studies   Cardiac Studies & Procedures   ______________________________________________________________________________________________ CARDIAC CATHETERIZATION  CARDIAC CATHETERIZATION  01/21/2024  Conclusion Table formatting from the original result was not included. Images from the original result were not included. Dominance: Right -> Angiographically Normal Coronary Arteries.  Normal LVEDP.  Nonischemic etiology for VT  RECOMMENDATIONS   Anticipated discharge date to be determined.   Recommend to resume Apixaban , at currently prescribed dose and frequency on 01/22/2024.   Could reinitiate Apixaban  as early as a.m. of 01/22/2024 versus placed back on IV heparin  pending further evaluation.    Alm Clay, MD  Findings Coronary Findings Diagnostic  Dominance: Right  Left Main Vessel was injected. Vessel is normal in caliber. Vessel is angiographically normal.  Left Anterior Descending Vessel was injected. Vessel is normal in caliber. Vessel is angiographically normal. The vessel is mildly tortuous.  Second Diagonal Branch Vessel is small in size.  Left Circumflex Vessel was injected. Vessel is normal in caliber and moderate in size.  First Obtuse Marginal Branch Vessel is small in size.  Second Obtuse Marginal Branch Vessel is large in size. Vessel is angiographically normal. The vessel is tortuous. Tortuous distal vessel  Third Obtuse Marginal Branch Vessel is large in size.  Right Coronary Artery Vessel was injected. Vessel is normal in caliber. Vessel is angiographically normal.  Acute Marginal Branch Vessel is small in size.  Right Ventricular Branch Vessel is small in size.  Right Posterior Descending Artery Vessel is small in size.  Intervention  No interventions have been documented.   STRESS TESTS  NM MYOCAR MULTI W/SPECT W 08/31/2017  Narrative  There was no ST segment deviation noted during stress.  The study is normal. There are no perfusion defects consistent with prior infarct or current ischemia.  This is a low risk study.  The left ventricular ejection fraction is normal (55-65%).    ECHOCARDIOGRAM  ECHOCARDIOGRAM COMPLETE 01/19/2024  Narrative ECHOCARDIOGRAM REPORT    Patient Name:   [name] Date of Exam: 01/19/2024 Medical Rec #:  [id number]     Height:       62.0 in Accession #:    [id number]    Weight:       105.2 lb Date of Birth:  Jun 10, 1940    BSA:          1.455 m Patient Age:    82 years      BP:           140/57 mmHg Patient Gender: F             HR:           80 bpm. Exam Location:  [name]  Procedure: 2D Echo, Cardiac Doppler and Color Doppler (Both Spectral and Color Flow Doppler were utilized during procedure).  Indications:    Ventricular Tachycardia l47.2  History:        Patient has prior history of Echocardiogram examinations, most recent 09/24/2022. Arrythmias:Atrial Fibrillation; Risk Factors:Dyslipidemia, Former Smoker and Pre-diabetes. Hx of COVID-19.  Sonographer:    Aida Pizza RCS Referring Phys: [id number] [name]  IMPRESSIONS   1. Left ventricular ejection fraction, by estimation, is 60 to 65%. The left ventricle has normal function. The left ventricle has no regional wall motion abnormalities. Left ventricular diastolic parameters were normal. 2. Right ventricular systolic function is normal. The right ventricular size is normal. There is normal pulmonary artery systolic pressure. The estimated right ventricular systolic pressure is 33.2 mmHg. 3. A small pericardial effusion is present. The pericardial effusion is posterior to the left ventricle. 4. The mitral valve is grossly normal. Mild mitral valve regurgitation. 5. Tricuspid valve regurgitation is moderate. 6. The aortic valve has an indeterminant number of cusps, cannot exclude functionally bicuspid valve. Aortic valve regurgitation is not visualized. No aortic stenosis is present. Aortic valve mean gradient measures 7.0 mmHg. Calification noted within LVOT and annulus. 7. The inferior vena cava is normal in size with greater than 50% respiratory  variability, suggesting right atrial pressure of 3 mmHg.  Comparison(s): Prior images reviewed side by side. LVEF 60-65%. Possible functionally bicuspid aortic valve without stenosis.  FINDINGS Left Ventricle: Left ventricular ejection fraction, by estimation, is 60 to 65%. The left ventricle has normal function. The left ventricle has no regional wall motion abnormalities. The left ventricular internal cavity size was normal in size. There is borderline left ventricular hypertrophy. Left ventricular diastolic parameters were normal.  Right Ventricle: The right ventricular size is normal. No increase in right ventricular wall thickness. Right ventricular systolic function is normal. There is normal pulmonary artery systolic pressure. The tricuspid regurgitant velocity is 2.75 m/s, and with an assumed right atrial pressure of 3 mmHg, the estimated right ventricular systolic pressure is 33.2 mmHg.  Left Atrium: Left atrial size was normal in size.  Right Atrium: Right atrial size was normal in size.  Pericardium: A small  pericardial effusion is present. The pericardial effusion is posterior to the left ventricle.  Mitral Valve: The mitral valve is grossly normal. Mild mitral valve regurgitation.  Tricuspid Valve: The tricuspid valve is grossly normal. Tricuspid valve regurgitation is moderate.  Aortic Valve: The aortic valve has an indeterminant number of cusps. Aortic valve regurgitation is not visualized. No aortic stenosis is present. Aortic valve mean gradient measures 7.0 mmHg. Aortic valve peak gradient measures 14.3 mmHg. Aortic valve area, by VTI measures 1.20 cm.  Pulmonic Valve: The pulmonic valve was grossly normal. Pulmonic valve regurgitation is trivial.  Aorta: The aortic root is normal in size and structure.  Venous: The inferior vena cava is normal in size with greater than 50% respiratory variability, suggesting right atrial pressure of 3 mmHg.  IAS/Shunts: No atrial  level shunt detected by color flow Doppler.  Additional Comments: 3D was performed not requiring image post processing on an independent workstation and was indeterminate.   LEFT VENTRICLE PLAX 2D LVIDd:         4.20 cm   Diastology LVIDs:         2.40 cm   LV e' medial:    12.10 cm/s LV PW:         1.00 cm   LV E/e' medial:  9.0 LV IVS:        0.90 cm   LV e' lateral:   14.20 cm/s LVOT diam:     1.60 cm   LV E/e' lateral: 7.7 LV SV:         53 LV SV Index:   37 LVOT Area:     2.01 cm   RIGHT VENTRICLE RV S prime:     14.00 cm/s TAPSE (M-mode): 2.8 cm  LEFT ATRIUM             Index        RIGHT ATRIUM           Index LA diam:        2.90 cm 1.99 cm/m   RA Area:     12.60 cm LA Vol (A2C):   20.8 ml 14.30 ml/m  RA Volume:   25.70 ml  17.67 ml/m LA Vol (A4C):   32.2 ml 22.14 ml/m LA Biplane Vol: 26.1 ml 17.94 ml/m AORTIC VALVE AV Area (Vmax):    1.16 cm AV Area (Vmean):   1.20 cm AV Area (VTI):     1.20 cm AV Vmax:           189.00 cm/s AV Vmean:          125.000 cm/s AV VTI:            0.444 m AV Peak Grad:      14.3 mmHg AV Mean Grad:      7.0 mmHg LVOT Vmax:         109.00 cm/s LVOT Vmean:        74.900 cm/s LVOT VTI:          0.265 m LVOT/AV VTI ratio: 0.60  AORTA Ao Root diam: 2.80 cm  MITRAL VALVE                TRICUSPID VALVE MV Area (PHT): 3.37 cm     TR Peak grad:   30.2 mmHg MV Decel Time: 225 msec     TR Vmax:        275.00 cm/s MV E velocity: 109.00 cm/s MV A velocity: 88.00 cm/s   SHUNTS MV E/A ratio:  1.24         Systemic VTI:  0.26 m Systemic Diam: 1.60 cm  Jayson Sierras MD Electronically signed by Jayson Sierras MD Signature Date/Time: 01/19/2024/3:00:38 PM    Final    MONITORS  CARDIAC EVENT MONITOR 09/06/2018  Narrative  48 hr holter monitor  Min HR 50, Max HR 103, Avg HR 68  Rare ventricular ectopy, 2 isolated PVCs  Rare supraventricular ectopy. Primarily PACs. Three short runs of atach, longest 4 beats.  Reported  symptoms correlated with sinus rhythm     CARDIAC MRI  MR CARDIAC MORPHOLOGY W WO CONTRAST 01/24/2024  Narrative CLINICAL DATA:  Ventricular tachycardia (VT), sustained  EXAM: MR CARDIA MORPHOLOGY WITHOUT AND WITH CONTRAST; MR CARDIAC VELOCITY FLOW MAPPING  TECHNIQUE: The patient was scanned on a 1.5 Tesla Siemens magnet. A dedicated cardiac coil was used. Functional imaging was done using TrueFisp sequences. 2,3, and 4 chamber views were done to assess for RWMA's. Modified Simpson's rule using a short axis stack was used to calculate an ejection fraction on a dedicated work [employer]. The patient received 5mL GADAVIST  GADOBUTROL  1 MMOL/ML IV SOLN. After 10 minutes inversion recovery sequences were used to assess for infiltration and scar tissue. Phase contrast velocity encoded images obtained x 2.  This examination is tailored for evaluation cardiac anatomy and function and provides very limited assessment of noncardiac structures, which are accordingly not evaluated during interpretation. If there is clinical concern for extracardiac pathology, further evaluation with CT imaging should be considered.  FINDINGS: LEFT VENTRICLE: Normal left ventricular size. Maximum septal wall thickness: 0.7 cm. Posterior wall thickness 0.7 cm. Left ventricular internal diameter (diastole): 4.8 cm. There are no regional wall motion abnormalities.  LV EF: 59% (Normal 52-79%)  Absolute volumes:  LV EDV: 93 mL (Normal 78-167 mL)  LV ESV: 38 mL (Normal 21-64 mL)  LV SV: 55 mL (Normal 52-114 mL)  CO: 5.4 L/min (Normal 2.7-6.3 L/min)  Indexed volumes:  LV EDV: 62 mL/sq-m (Normal 50-96 mL/sq-m)  LV ESV: 25 mL/sq-m (Normal 10-40 mL/sq-m)  LV SV: 37 mL/sq-m (Normal 33-64 mL/sq-m)  CI: 3.6 L/min/sq-m (Normal 1.9-3.9 L/min/sq-m)  RIGHT VENTRICLE: Normal right ventricular chamber size. Normal right ventricular wall thickness. Normal right ventricular  systolic function. There are no regional wall motion abnormalities.  RV EF: 54% (Normal 52-80%)  Absolute volumes:  RV EDV: 95 mL (Normal 79-175 mL)  RV ESV: 44 mL (Normal 13-75 mL)  RV SV: 52 mL (Normal 56-110 mL)  CO: 5.1 L/min (Normal 2.7-6 L/min)  Indexed volumes:  RV EDV: 64 mL/sq-m (Normal 51-97 mL/sq-m)  RV ESV: 29 mL/sq-m (Normal 9-42 mL/sq-m)  RV SV: 35 mL/sq-m (Normal 35-61 mL/sq-m)  CI: 3.4 L/min/sq-m (Normal 1.8-3.8 L/min/sq-m)  Left atrium: Normal size.  Right atrium: Normal size.  Mitral valve: Normal mitral valve without regurgitation.  Aortic valve: Grossly normal without regurgitation.  Tricuspid valve: Normal tricuspid valve with mild regurgitation.  Pulmonic valve: Normal pulmonary valve without regurgitation.  Aorta: The aortic root and proximal ascending aorta are normal in diameter.  Pericardium: The pericardium is normal thickness. Trivial pericardial effusion.  There is no evidence of intracardiac thrombus.  Systemic flow across the aortic valve (QS) was 4.5 L/min.  Pulmonary flow across the pulmonic valve (QP) was 5.1  L/min.  The QP:QS calculated across the aortic and pulmonic valves was 1.1.  Native myocardial T1-relaxation times were normal at 1010 ms.  Delayed Enhancement: No delayed enhancement. No evidence of infarction, inflammation, or infiltrative  disorder.  Extracardiac structures: Mild bilateral pleural effusions noted.  IMPRESSION: 1. The left ventricle is normal in cavity size and wall thickness. Systolic function was normal with LVEF 59%. No regional wall motion abnormalities.  2. The right ventricle is normal in cavity size and systolic function.  3. No significant valvular heart disease.  4. Delayed enhancement imaging demonstrates no evidence of myocardial infarction, inflammation, or infiltrative disorder.  5. Mild bilateral pleural effusions noted.  Darryle Decent, MD   Electronically Signed By:  Darryle Decent M.D. On: 01/24/2024 14:56   ______________________________________________________________________________________________           Assessment & Plan   SOB with new pleural effusion -seen on CXR and CMR; with normal bi-ventricular function, likely non-cardiac and related to volume resuscitation after orthostatic hypotension - continue midodrine  - 20 IV lasix  X 1 1400 - PO transition tomorrow  WCT with AF - see EP note, plans for amiodarone  and DC of home flecainide  with continued AC  Discussed at length with family (see IM notes)  For questions or updates, please contact [facility] Please consult [email or web address] for contact info under Cardiology/STEMI.      Stanly Leavens, MD FASE [facility] Cardiologist [facility]  [address] [place], [address] [place], [place] [address] [phone]  10:07 AM

## 2024-01-25 NOTE — Progress Notes (Signed)
 Ice pack applied to facial injury at 1530 Ice pack removed at 1550

## 2024-01-25 NOTE — Progress Notes (Addendum)
 Inpatient Rehab Admissions Coordinator:   Per [facility] request, patient was screened for CIR candidacy by Leita Kleine, MS, CCC-SLP. At this time, PT/OT recommend SNF and pt. Is demonstrating poor tolerance. I do not think she could tolerate 3 hours of daily therapy. Additionally, I do not think payor will approve AIR for her diagnoses. I will not pursue a rehab consult for this Pt.   Recommend other rehab venues to be pursued.  Please contact me with any questions.  Leita Kleine, MS, CCC-SLP Rehab Admissions Coordinator  [phone] (celll) [phone] (office)

## 2024-01-25 NOTE — Progress Notes (Signed)
 Rounding Note   Patient Name: [name] Date of Encounter: 01/25/2024  [facility] Cardiologist: Alvan Carrier, MD   Subjective  Sitting at the edge of the bed with mobility team, no actively SOB  No CP Reports feeling weak, tired + ongoing back pain  Scheduled Meds:  amiodarone   200 mg Oral BID   apixaban   2.5 mg Oral BID   feeding supplement  237 mL Oral TID BM   lactulose   20 g Oral QHS   midodrine   5 mg Oral TID WC   pantoprazole   40 mg Oral Daily   QUEtiapine   25 mg Oral Q24H   rosuvastatin   40 mg Oral Daily   sodium chloride  flush  3 mL Intravenous Q12H   Continuous Infusions:  ampicillin -sulbactam (UNASYN ) IV     PRN Meds: acetaminophen , HYDROcodone -acetaminophen , levalbuterol , methocarbamol , prochlorperazine , sodium chloride  flush   Vital Signs  Vitals:   01/24/24 1940 01/24/24 2318 01/25/24 0427 01/25/24 0902  BP: (!) 165/58 (!) 138/57 (!) 143/54 (!) 140/54  Pulse: (!) 57 77 79 83  Resp: 16 20 16 18   Temp: 98.6 F (37 C) 98.1 F (36.7 C) 98.9 F (37.2 C) 98.1 F (36.7 C)  TempSrc: Oral Oral Oral Oral  SpO2: 98% 97% 99% 90%  Weight:      Height:        Intake/Output Summary (Last 24 hours) at 01/25/2024 0918 Last data filed at 01/25/2024 0900 Gross per 24 hour  Intake 975.21 ml  Output 1330 ml  Net -354.79 ml      01/21/2024    8:52 AM 01/20/2024   10:23 PM 01/15/2024   10:10 PM  Last 3 Weights  Weight (lbs) 112 lb 10.5 oz 112 lb 10.5 oz 105 lb 2.6 oz  Weight (kg) 51.1 kg 51.1 kg 47.7 kg      Telemetry SR 60's-80's - Personally Reviewed  ECG  No new EKGs - Personally Reviewed  Physical Exam  GEN: No acute distress, frail, scattered ecchymosis, bandages remain face/forehead.   Neck: No JVD Cardiac: RRR, no murmurs, rubs, or gallops.  Respiratory: decreased at the bases GI: Soft, nontender, non-distended  MS: 1+ edema; No deformity, advanced atrophy Neuro:  Nonfocal  Psych: Normal affect   Labs High Sensitivity  Troponin:   Recent Labs  Lab 01/18/24 1158  TROPONINIHS 84*     Chemistry Recent Labs  Lab 01/19/24 0443 01/20/24 0541 01/21/24 0430 01/21/24 0652 01/22/24 0536 01/23/24 0531 01/24/24 0552  NA 131* 130* 130*   < > 130* 130* 140  K 4.3 4.4 4.6   < > 4.8 3.7 3.6  CL 101 96* 94*   < > 95* 103 111  CO2 22 23 21*   < > 20* 17* 17*  GLUCOSE 122* 131* 113*   < > 93 121* 117*  BUN 22 22 29*   < > 33* 30* 22  CREATININE 1.07* 1.10* 1.66*   < > 1.61* 1.47* 1.23*  CALCIUM  8.0* 7.9* 8.1*   < > 7.9* 7.2* 7.5*  MG 1.9 1.9 1.9  --   --   --   --   GFRNONAA 53* 51* 31*   < > 32* 36* 44*  ANIONGAP 8 11 15    < > 15 10 12    < > = values in this interval not displayed.    Lipids No results for input(s): CHOL, TRIG, HDL, LABVLDL, LDLCALC, CHOLHDL in the last 168 hours.  Hematology Recent Labs  Lab 01/23/24 [phone]  01/24/24 0552 01/25/24 0108  WBC 18.2* 17.2* 20.2*  RBC 2.44* 2.26* 2.42*  HGB 8.0* 7.5* 7.9*  HCT 24.8* 22.9* 24.6*  MCV 101.6* 101.3* 101.7*  MCH 32.8 33.2 32.6  MCHC 32.3 32.8 32.1  RDW 12.5 12.9 13.2  PLT 222 244 302   Thyroid   Recent Labs  Lab 01/18/24 1158  TSH 0.417    BNP Recent Labs  Lab 01/25/24 0108  BNP 1,072.2*    DDimer No results for input(s): DDIMER in the last 168 hours.   Radiology    Cardiac Studies  01/24/24: c.MRI IMPRESSION: 1. The left ventricle is normal in cavity size and wall thickness. Systolic function was normal with LVEF 59%. No regional wall motion abnormalities.   2. The right ventricle is normal in cavity size and systolic function.   3. No significant valvular heart disease.   4. Delayed enhancement imaging demonstrates no evidence of myocardial infarction, inflammation, or infiltrative disorder.   5. Mild bilateral pleural effusions noted.     01/21/24: LHC Dominance: Right -> Angiographically Normal Coronary Arteries.  Normal LVEDP.    01/19/24: TTE 1. Left ventricular ejection fraction, by  estimation, is 60 to 65%. The  left ventricle has normal function. The left ventricle has no regional  wall motion abnormalities. Left ventricular diastolic parameters were  normal.   2. Right ventricular systolic function is normal. The right ventricular  size is normal. There is normal pulmonary artery systolic pressure. The  estimated right ventricular systolic pressure is 33.2 mmHg.   3. A small pericardial effusion is present. The pericardial effusion is  posterior to the left ventricle.   4. The mitral valve is grossly normal. Mild mitral valve regurgitation.   5. Tricuspid valve regurgitation is moderate.   6. The aortic valve has an indeterminant number of cusps, cannot exclude  functionally bicuspid valve. Aortic valve regurgitation is not visualized.  No aortic stenosis is present. Aortic valve mean gradient measures 7.0  mmHg. Calification noted within  LVOT and annulus.   7. The inferior vena cava is normal in size with greater than 50%  respiratory variability, suggesting right atrial pressure of 3 mmHg.   Patient Profile   83 y.o. female w/PMHx of  Orthostatic hypotension/dizziness (chronically on midodrine ) AFib  Admitted w/syncope Though H&P reports no full syncope, + trauma, and felt was 2/2 orthostasis. Reports of poor oral intake >> IVF 9/9 developed sustained WCT ~ 3 hours (no EKG), pt minimally if at all symptomatic, rate 120's reported K 3.8, magnesium  1.5 replaced Pt reported severe MSK pain (chronic) and treated with morphine  Cardiology consulted, her home flecainide  stopped, suspect to be aberrancy  Consider EP out pt, rec d/c with a monitor Fall/syncope felt orthostatic 9/11 > developed recurrent NSWCTs > planned to transfer to [facility] for cath and further evaluation   Assessment & Plan   WCT Paroxysmal Afib CHA2DS2Vasc is 3, on Eliquis   In review is c/w Afib/aberrancy not felt to be VT Had been on flecainide  out patient >> agree with stopping this  (failed w/recurrent AFib) Recommend Amiodarone  200mg  BID for 2 weeks, then daily Dr. Almetta spoke to patient/family medication and potential off target/side effects Will arrange EP follow up  3.  SOB Suspect volume OL s/p IVF on admission S/p lasix  Gen cards team on board   Pending GOC/palliative team Functional decline Leukocytosis Anemia And other medical issues as per IM team       For questions or updates, please contact [facility]  Please consult [email or web address] for contact info under   Signed, Charlies Macario Arthur, PA-C  01/25/2024, 9:18 AM

## 2024-01-25 NOTE — Progress Notes (Signed)
 Mobility Specialist Progress Note:   01/25/24 0900  Mobility  Activity Pivoted/transferred from bed to chair  Level of Assistance Minimal assist, patient does 75% or more  Assistive Device Other (Comment) (HHA)  Distance Ambulated (ft) 3 ft  Activity Response Tolerated well  Mobility Referral Yes  Mobility visit 1 Mobility  Mobility Specialist Start Time (ACUTE ONLY) 0900  Mobility Specialist Stop Time (ACUTE ONLY) 0910  Mobility Specialist Time Calculation (min) (ACUTE ONLY) 10 min   Pt agreeable to mobility session post-bath. Required modA to get to EOB, minA to stand from EOB with HHA. Pt c/o chronic back pain, otherwise feeling fair. Denies dizziness with transfer. Pt left with all needs met.   [name] Mobility Specialist Please contact via SecureChat or  Rehab office at [phone]

## 2024-01-26 ENCOUNTER — Encounter: Payer: Self-pay | Admitting: Cardiology

## 2024-01-26 DIAGNOSIS — I951 Orthostatic hypotension: Secondary | ICD-10-CM | POA: Diagnosis not present

## 2024-01-26 LAB — CBC WITH DIFFERENTIAL/PLATELET
Abs Immature Granulocytes: 0.51 K/uL — ABNORMAL HIGH (ref 0.00–0.07)
Basophils Absolute: 0 K/uL (ref 0.0–0.1)
Basophils Relative: 0 %
Eosinophils Absolute: 0.2 K/uL (ref 0.0–0.5)
Eosinophils Relative: 1 %
HCT: 22.8 % — ABNORMAL LOW (ref 36.0–46.0)
Hemoglobin: 7.5 g/dL — ABNORMAL LOW (ref 12.0–15.0)
Immature Granulocytes: 3 %
Lymphocytes Relative: 9 %
Lymphs Abs: 1.7 K/uL (ref 0.7–4.0)
MCH: 32.9 pg (ref 26.0–34.0)
MCHC: 32.9 g/dL (ref 30.0–36.0)
MCV: 100 fL (ref 80.0–100.0)
Monocytes Absolute: 1.4 K/uL — ABNORMAL HIGH (ref 0.1–1.0)
Monocytes Relative: 8 %
Neutro Abs: 14.1 K/uL — ABNORMAL HIGH (ref 1.7–7.7)
Neutrophils Relative %: 79 %
Platelets: 323 K/uL (ref 150–400)
RBC: 2.28 MIL/uL — ABNORMAL LOW (ref 3.87–5.11)
RDW: 13.3 % (ref 11.5–15.5)
WBC: 17.9 K/uL — ABNORMAL HIGH (ref 4.0–10.5)
nRBC: 0 % (ref 0.0–0.2)

## 2024-01-26 LAB — BASIC METABOLIC PANEL WITH GFR
Anion gap: 8 (ref 5–15)
BUN: 15 mg/dL (ref 8–23)
CO2: 21 mmol/L — ABNORMAL LOW (ref 22–32)
Calcium: 7.5 mg/dL — ABNORMAL LOW (ref 8.9–10.3)
Chloride: 109 mmol/L (ref 98–111)
Creatinine, Ser: 1.29 mg/dL — ABNORMAL HIGH (ref 0.44–1.00)
GFR, Estimated: 42 mL/min — ABNORMAL LOW (ref >60)
Glucose, Bld: 116 mg/dL — ABNORMAL HIGH (ref 70–99)
Potassium: 4 mmol/L (ref 3.5–5.1)
Sodium: 138 mmol/L (ref 135–145)

## 2024-01-26 LAB — MAGNESIUM: Magnesium: 1.1 mg/dL — ABNORMAL LOW (ref 1.7–2.4)

## 2024-01-26 MED ORDER — FUROSEMIDE 20 MG PO TABS
20.0000 mg | ORAL_TABLET | Freq: Every day | ORAL | Status: DC
  Administered 2024-01-26 – 2024-01-27 (×2): 20 mg via ORAL
  Filled 2024-01-26 (×2): qty 1

## 2024-01-26 MED ORDER — MAGNESIUM SULFATE 4 GM/100ML IV SOLN
4.0000 g | Freq: Once | INTRAVENOUS | Status: AC
  Administered 2024-01-26: 4 g via INTRAVENOUS
  Filled 2024-01-26: qty 100

## 2024-01-26 NOTE — Progress Notes (Signed)
 Telemetry reviewed with Dr. Almetta Maintaining SR 60s Continue amiodarone  200mg  BID for 2 weeks then reduce to 200mg  daily (NO FLECAINIDE ) No specific EP f/u needed unless Dr. Alvan team feel necessary EP will sign off though remain available Please recall if needed  Charlies Arthur, PA-C

## 2024-01-26 NOTE — Progress Notes (Signed)
 Orthostatic Vital signs:   01/26/24 0807  Orthostatic Lying   BP- Lying 130/51  Pulse- Lying 74  Orthostatic Sitting  BP- Sitting 135/60  Pulse- Sitting 96  Orthostatic Standing at 0 minutes  BP- Standing at 0 minutes 113/53  Pulse- Standing at 0 minutes 96  Orthostatic Standing at 3 minutes  BP- Standing at 3 minutes 92/67  Pulse- Standing at 3 minutes 136  Oxygen Therapy  SpO2 91 %  O2 Device Room Air  Patient Activity (if Appropriate) In bed  Pulse Oximetry Type Continuous  Pain Assessment  Pain Scale 0-10  Pain Score 3  Pain Type Chronic pain  Pain Location Back  Pain Orientation Lower  Pain Radiating Towards non-radiaing  Pain Descriptors / Indicators Aching;Sore  Pain Frequency Constant  Pain Onset On-going  Patients Stated Pain Goal 2  Pain Intervention(s) Repositioned (patient up OOB to chair)

## 2024-01-26 NOTE — Progress Notes (Signed)
 Progress Note  Patient Name: [name] Date of Encounter: 01/26/2024 Primary Cardiologist: Alvan Carrier, MD   Subjective    No SOB.  No CP.  Feels good.  Vital Signs    Vitals:   01/26/24 0421 01/26/24 0727 01/26/24 0807 01/26/24 1145  BP: (!) 168/65 (!) 130/51  (!) 141/57  Pulse: 85 67  81  Resp:  16  16  Temp: 99.7 F (37.6 C)     TempSrc: Oral   Oral  SpO2: 94% 90% 91% 97%  Weight:      Height:        Intake/Output Summary (Last 24 hours) at 01/26/2024 1229 Last data filed at 01/25/2024 2243 Gross per 24 hour  Intake 937 ml  Output 1450 ml  Net -513 ml   Filed Weights   01/15/24 2210 01/20/24 2223 01/21/24 0852  Weight: 47.7 kg 51.1 kg 51.1 kg    Physical Exam   GEN: No acute distress.   Neck: No JVD Cardiac: RRR, no murmurs, rubs, or gallops.  Respiratory: Coarse breath sounds bilaterally. GI: Soft, nontender, non-distended  MS: No edema  Labs   Telemetry: SR    Chemistry Recent Labs  Lab 01/24/24 0552 01/25/24 1102 01/26/24 0525  NA 140 141 138  K 3.6 3.0* 4.0  CL 111 111 109  CO2 17* 19* 21*  GLUCOSE 117* 174* 116*  BUN 22 19 15   CREATININE 1.23* 1.32* 1.29*  CALCIUM  7.5* 7.9* 7.5*  GFRNONAA 44* 41* 42*  ANIONGAP 12 11 8      Hematology Recent Labs  Lab 01/24/24 0552 01/25/24 0108 01/26/24 0525  WBC 17.2* 20.2* 17.9*  RBC 2.26* 2.42* 2.28*  HGB 7.5* 7.9* 7.5*  HCT 22.9* 24.6* 22.8*  MCV 101.3* 101.7* 100.0  MCH 33.2 32.6 32.9  MCHC 32.8 32.1 32.9  RDW 12.9 13.2 13.3  PLT 244 302 323     BNP Recent Labs  Lab 01/25/24 0108  BNP 1,072.2*      Cardiac Studies   Cardiac Studies & Procedures   ______________________________________________________________________________________________ CARDIAC CATHETERIZATION  CARDIAC CATHETERIZATION 01/21/2024  Conclusion Table formatting from the original result was not included. Images from the original result were not included. Dominance: Right -> Angiographically  Normal Coronary Arteries.  Normal LVEDP.  Nonischemic etiology for VT  RECOMMENDATIONS   Anticipated discharge date to be determined.   Recommend to resume Apixaban , at currently prescribed dose and frequency on 01/22/2024.   Could reinitiate Apixaban  as early as a.m. of 01/22/2024 versus placed back on IV heparin  pending further evaluation.    Alm Clay, MD  Findings Coronary Findings Diagnostic  Dominance: Right  Left Main Vessel was injected. Vessel is normal in caliber. Vessel is angiographically normal.  Left Anterior Descending Vessel was injected. Vessel is normal in caliber. Vessel is angiographically normal. The vessel is mildly tortuous.  Second Diagonal Branch Vessel is small in size.  Left Circumflex Vessel was injected. Vessel is normal in caliber and moderate in size.  First Obtuse Marginal Branch Vessel is small in size.  Second Obtuse Marginal Branch Vessel is large in size. Vessel is angiographically normal. The vessel is tortuous. Tortuous distal vessel  Third Obtuse Marginal Branch Vessel is large in size.  Right Coronary Artery Vessel was injected. Vessel is normal in caliber. Vessel is angiographically normal.  Acute Marginal Branch Vessel is small in size.  Right Ventricular Branch Vessel is small in size.  Right Posterior Descending Artery Vessel is small in size.  Intervention  No interventions have been documented.   STRESS TESTS  NM MYOCAR MULTI W/SPECT W 08/31/2017  Narrative  There was no ST segment deviation noted during stress.  The study is normal. There are no perfusion defects consistent with prior infarct or current ischemia.  This is a low risk study.  The left ventricular ejection fraction is normal (55-65%).   ECHOCARDIOGRAM  ECHOCARDIOGRAM COMPLETE 01/19/2024  Narrative ECHOCARDIOGRAM REPORT    Patient Name:   [name] Date of Exam: 01/19/2024 Medical Rec #:  [id number]     Height:       62.0  in Accession #:    [id number]    Weight:       105.2 lb Date of Birth:  07-05-1940    BSA:          1.455 m Patient Age:    82 years      BP:           140/57 mmHg Patient Gender: F             HR:           80 bpm. Exam Location:  [name]  Procedure: 2D Echo, Cardiac Doppler and Color Doppler (Both Spectral and Color Flow Doppler were utilized during procedure).  Indications:    Ventricular Tachycardia l47.2  History:        Patient has prior history of Echocardiogram examinations, most recent 09/24/2022. Arrythmias:Atrial Fibrillation; Risk Factors:Dyslipidemia, Former Smoker and Pre-diabetes. Hx of COVID-19.  Sonographer:    Aida Pizza RCS Referring Phys: [id number] [name]  IMPRESSIONS   1. Left ventricular ejection fraction, by estimation, is 60 to 65%. The left ventricle has normal function. The left ventricle has no regional wall motion abnormalities. Left ventricular diastolic parameters were normal. 2. Right ventricular systolic function is normal. The right ventricular size is normal. There is normal pulmonary artery systolic pressure. The estimated right ventricular systolic pressure is 33.2 mmHg. 3. A small pericardial effusion is present. The pericardial effusion is posterior to the left ventricle. 4. The mitral valve is grossly normal. Mild mitral valve regurgitation. 5. Tricuspid valve regurgitation is moderate. 6. The aortic valve has an indeterminant number of cusps, cannot exclude functionally bicuspid valve. Aortic valve regurgitation is not visualized. No aortic stenosis is present. Aortic valve mean gradient measures 7.0 mmHg. Calification noted within LVOT and annulus. 7. The inferior vena cava is normal in size with greater than 50% respiratory variability, suggesting right atrial pressure of 3 mmHg.  Comparison(s): Prior images reviewed side by side. LVEF 60-65%. Possible functionally bicuspid aortic valve without stenosis.  FINDINGS Left  Ventricle: Left ventricular ejection fraction, by estimation, is 60 to 65%. The left ventricle has normal function. The left ventricle has no regional wall motion abnormalities. The left ventricular internal cavity size was normal in size. There is borderline left ventricular hypertrophy. Left ventricular diastolic parameters were normal.  Right Ventricle: The right ventricular size is normal. No increase in right ventricular wall thickness. Right ventricular systolic function is normal. There is normal pulmonary artery systolic pressure. The tricuspid regurgitant velocity is 2.75 m/s, and with an assumed right atrial pressure of 3 mmHg, the estimated right ventricular systolic pressure is 33.2 mmHg.  Left Atrium: Left atrial size was normal in size.  Right Atrium: Right atrial size was normal in size.  Pericardium: A small pericardial effusion is present. The pericardial effusion is posterior to the left ventricle.  Mitral Valve: The mitral valve is grossly  normal. Mild mitral valve regurgitation.  Tricuspid Valve: The tricuspid valve is grossly normal. Tricuspid valve regurgitation is moderate.  Aortic Valve: The aortic valve has an indeterminant number of cusps. Aortic valve regurgitation is not visualized. No aortic stenosis is present. Aortic valve mean gradient measures 7.0 mmHg. Aortic valve peak gradient measures 14.3 mmHg. Aortic valve area, by VTI measures 1.20 cm.  Pulmonic Valve: The pulmonic valve was grossly normal. Pulmonic valve regurgitation is trivial.  Aorta: The aortic root is normal in size and structure.  Venous: The inferior vena cava is normal in size with greater than 50% respiratory variability, suggesting right atrial pressure of 3 mmHg.  IAS/Shunts: No atrial level shunt detected by color flow Doppler.  Additional Comments: 3D was performed not requiring image post processing on an independent workstation and was indeterminate.   LEFT VENTRICLE PLAX  2D LVIDd:         4.20 cm   Diastology LVIDs:         2.40 cm   LV e' medial:    12.10 cm/s LV PW:         1.00 cm   LV E/e' medial:  9.0 LV IVS:        0.90 cm   LV e' lateral:   14.20 cm/s LVOT diam:     1.60 cm   LV E/e' lateral: 7.7 LV SV:         53 LV SV Index:   37 LVOT Area:     2.01 cm   RIGHT VENTRICLE RV S prime:     14.00 cm/s TAPSE (M-mode): 2.8 cm  LEFT ATRIUM             Index        RIGHT ATRIUM           Index LA diam:        2.90 cm 1.99 cm/m   RA Area:     12.60 cm LA Vol (A2C):   20.8 ml 14.30 ml/m  RA Volume:   25.70 ml  17.67 ml/m LA Vol (A4C):   32.2 ml 22.14 ml/m LA Biplane Vol: 26.1 ml 17.94 ml/m AORTIC VALVE AV Area (Vmax):    1.16 cm AV Area (Vmean):   1.20 cm AV Area (VTI):     1.20 cm AV Vmax:           189.00 cm/s AV Vmean:          125.000 cm/s AV VTI:            0.444 m AV Peak Grad:      14.3 mmHg AV Mean Grad:      7.0 mmHg LVOT Vmax:         109.00 cm/s LVOT Vmean:        74.900 cm/s LVOT VTI:          0.265 m LVOT/AV VTI ratio: 0.60  AORTA Ao Root diam: 2.80 cm  MITRAL VALVE                TRICUSPID VALVE MV Area (PHT): 3.37 cm     TR Peak grad:   30.2 mmHg MV Decel Time: 225 msec     TR Vmax:        275.00 cm/s MV E velocity: 109.00 cm/s MV A velocity: 88.00 cm/s   SHUNTS MV E/A ratio:  1.24         Systemic VTI:  0.26 m Systemic Diam: 1.60 cm  Jayson  Mcdowell MD Electronically signed by Jayson Sierras MD Signature Date/Time: 01/19/2024/3:00:38 PM    Final    MONITORS  CARDIAC EVENT MONITOR 09/06/2018  Narrative  48 hr holter monitor  Min HR 50, Max HR 103, Avg HR 68  Rare ventricular ectopy, 2 isolated PVCs  Rare supraventricular ectopy. Primarily PACs. Three short runs of atach, longest 4 beats.  Reported symptoms correlated with sinus rhythm     CARDIAC MRI  MR CARDIAC MORPHOLOGY W WO CONTRAST 01/24/2024  Narrative CLINICAL DATA:  Ventricular tachycardia (VT), sustained  EXAM: MR CARDIA  MORPHOLOGY WITHOUT AND WITH CONTRAST; MR CARDIAC VELOCITY FLOW MAPPING  TECHNIQUE: The patient was scanned on a 1.5 Tesla Siemens magnet. A dedicated cardiac coil was used. Functional imaging was done using TrueFisp sequences. 2,3, and 4 chamber views were done to assess for RWMA's. Modified Simpson's rule using a short axis stack was used to calculate an ejection fraction on a dedicated work [employer]. The patient received 5mL GADAVIST  GADOBUTROL  1 MMOL/ML IV SOLN. After 10 minutes inversion recovery sequences were used to assess for infiltration and scar tissue. Phase contrast velocity encoded images obtained x 2.  This examination is tailored for evaluation cardiac anatomy and function and provides very limited assessment of noncardiac structures, which are accordingly not evaluated during interpretation. If there is clinical concern for extracardiac pathology, further evaluation with CT imaging should be considered.  FINDINGS: LEFT VENTRICLE: Normal left ventricular size. Maximum septal wall thickness: 0.7 cm. Posterior wall thickness 0.7 cm. Left ventricular internal diameter (diastole): 4.8 cm. There are no regional wall motion abnormalities.  LV EF: 59% (Normal 52-79%)  Absolute volumes:  LV EDV: 93 mL (Normal 78-167 mL)  LV ESV: 38 mL (Normal 21-64 mL)  LV SV: 55 mL (Normal 52-114 mL)  CO: 5.4 L/min (Normal 2.7-6.3 L/min)  Indexed volumes:  LV EDV: 62 mL/sq-m (Normal 50-96 mL/sq-m)  LV ESV: 25 mL/sq-m (Normal 10-40 mL/sq-m)  LV SV: 37 mL/sq-m (Normal 33-64 mL/sq-m)  CI: 3.6 L/min/sq-m (Normal 1.9-3.9 L/min/sq-m)  RIGHT VENTRICLE: Normal right ventricular chamber size. Normal right ventricular wall thickness. Normal right ventricular systolic function. There are no regional wall motion abnormalities.  RV EF: 54% (Normal 52-80%)  Absolute volumes:  RV EDV: 95 mL (Normal 79-175 mL)  RV ESV: 44 mL (Normal 13-75 mL)  RV SV: 52 mL  (Normal 56-110 mL)  CO: 5.1 L/min (Normal 2.7-6 L/min)  Indexed volumes:  RV EDV: 64 mL/sq-m (Normal 51-97 mL/sq-m)  RV ESV: 29 mL/sq-m (Normal 9-42 mL/sq-m)  RV SV: 35 mL/sq-m (Normal 35-61 mL/sq-m)  CI: 3.4 L/min/sq-m (Normal 1.8-3.8 L/min/sq-m)  Left atrium: Normal size.  Right atrium: Normal size.  Mitral valve: Normal mitral valve without regurgitation.  Aortic valve: Grossly normal without regurgitation.  Tricuspid valve: Normal tricuspid valve with mild regurgitation.  Pulmonic valve: Normal pulmonary valve without regurgitation.  Aorta: The aortic root and proximal ascending aorta are normal in diameter.  Pericardium: The pericardium is normal thickness. Trivial pericardial effusion.  There is no evidence of intracardiac thrombus.  Systemic flow across the aortic valve (QS) was 4.5 L/min.  Pulmonary flow across the pulmonic valve (QP) was 5.1  L/min.  The QP:QS calculated across the aortic and pulmonic valves was 1.1.  Native myocardial T1-relaxation times were normal at 1010 ms.  Delayed Enhancement: No delayed enhancement. No evidence of infarction, inflammation, or infiltrative disorder.  Extracardiac structures: Mild bilateral pleural effusions noted.  IMPRESSION: 1. The left ventricle is normal in cavity size  and wall thickness. Systolic function was normal with LVEF 59%. No regional wall motion abnormalities.  2. The right ventricle is normal in cavity size and systolic function.  3. No significant valvular heart disease.  4. Delayed enhancement imaging demonstrates no evidence of myocardial infarction, inflammation, or infiltrative disorder.  5. Mild bilateral pleural effusions noted.  Darryle Decent, MD   Electronically Signed By: Darryle Decent M.D. On: 01/24/2024 14:56   ______________________________________________________________________________________________           Assessment & Plan   SOB with new pleural  effusion -seen on CXR and CMR; with normal bi-ventricular function, likely non-cardiac and related to volume resuscitation after orthostatic hypotension - continue midodrine  - lasix  20 mg PO daily  WCT with AF - see EP note, plans for amiodarone  and DC of home flecainide  with continued AC  Follow up 02/23/24   For questions or updates, please contact [facility] Please consult [email or web address] for contact info under Cardiology/STEMI.      Stanly Leavens, MD FASE [facility] Cardiologist [facility]  [address] [place], [address] [place], [place] [address] [phone]  12:29 PM

## 2024-01-26 NOTE — Progress Notes (Signed)
 Ice pack applied to facial injury at 1435 Ice pack removed at 1455

## 2024-01-26 NOTE — Progress Notes (Signed)
 Ice pack applied to facial injury at 1830 Ice pack removed at 1850.

## 2024-01-26 NOTE — Progress Notes (Signed)
 Ice pack applied to facial injury at 1130 Ice pack removed at 1150

## 2024-01-26 NOTE — Progress Notes (Addendum)
 PROGRESS NOTE    [name]  FMW:[id number] DOB: [birth year] DOA: 01/15/2024 PCP: Alphonsa Glendia LABOR, MD   Brief Narrative:  [name] is a 83 y.o. female with medical history significant of PAF on anticoagulation and flecainide , ostoporosis, hyperlipidemia, chronic pain on opiates, long hystory of orthostasis and has been on midodrine  intermittently over the last year presented with fall.  She was admitted to hospitalist service and [facility] for evaluation of fall due to orthostasis.  Due to ventricular tachycardia, seen by cardiology there, they recommended cardiac cath, patient transferred to [facility] for that.  Assessment & Plan:   Principal Problem:   Orthostasis Active Problems:   Paroxysmal atrial fibrillation (HCC)   Osteoporosis   Hyponatremia   Leukocytosis  Orthostasis S/p Fall, head trauma- -Long h/o orthostasis, complicated by dehydration.  She is on midodrine  5 mg 3 times daily PTA.  However her blood pressure has remained high, with systolic anywhere between 140-160 for most part.  Placed holding parameters to midodrine . - 2D echo demonstrating preserved ejection fraction, no wall motion normalities and no significant valvular disorder. - Continue as needed analgesics.  Orthostatics still positive.  Patient has been educated to be careful and how to prevent falls because this is a longstanding problem for her which may not be fixed.  Also discussed this with the family.   Ventricular tachycardia- Given ongoing changes and intermittent no sustained episodes of ventricular tachycardia decision has been made to proceed with cardiac cath for further evaluation.  Status postcardiac cath 01/21/2024 with normal coronary arteries. cMRI completed 01/24/2024 with LVEF 59% and no wall motion abnormalities. No evidence of infiltrative disease.  Patient maintaining sinus rhythm.   Paroxysmal atrial fibrillation - On Eliquis  and flecainide  PTA, transitioned to heparin  for now per  cardiology.  Flecainide  discontinued.  She has now been started on amiodarone  on 01/24/2024.  Maintaining sinus rhythm.  EP saw patient and signed off.  AKI on CKD stage IIIa/dehydration: Patient's creatinine varies and ranges anywhere from 0.8-1.2.  It peaked at 1.67, has continued to improve ever since and is back down to 1.2 and has been stable.   Hyponatremia In the setting of dehydration, likely hypovolemic, improved/resolved.  Hypokalemia: Resolved.  Chronic back pain: Resumed her home dose of hydrocodone  medications which she takes every 6 hours as needed.  She still complains of back pain however she did not cry last night for extra IV pain medications.  She was lethargic this morning.  Nurse was at the bedside as well.  Discussed with the patient as well as the nurse about my concern about her lethargy which is likely secondary to opioids, we will absolutely not escalate pain medications anymore.   Leukocytosis/?  Aspiration pneumonia: Initial chest x-ray showed possible bibasilar opacities, atypical infection but pulmonary edema as well.  Patient had no respiratory symptoms until last evening when she started coughing.  Leukocytosis worse however she remains afebrile.  Repeat chest x-ray 01/24/2024 was done due to respiratory distress and hypoxia which shows pulmonary edema and pleural effusion.  Due to worsening leukocytosis, concerned about possible aspiration pneumonia since patient had a fall and syncope and has been in and out of lethargy, raising concern for possible aspiration.  Started on Unasyn .  Volume overload/acute on chronic congestive heart failure with preserved ejection fraction: Overnight on 01/24/2024, patient became hypoxic with respiratory distress.  Chest x-ray was completed which showed worsening CHF and bilateral pleural effusion and BNP was also elevated.  Patient was  given couple of doses of IV Lasix , seen by cardiology, they have now transition her to oral Lasix  20 mg  daily.    Large bruise/hematoma under left eye: This happened a week ago due to fall.  Nurses have been advised to apply cool compresses every 2-3 hours.  This appears to be improving gradually.  Hypomagnesemia: Replenish today and recheck in the morning.   Debility- Generalized weakness- -Due to poor oral intake. -Lives alone, seen by PT OT, they recommend discharge home with home health however I was concerned about her safety, reconsulted PT OT, they have now updated their recommendation to SNF.  Family was opposed to going to SNF per my discussion with them yesterday however they have now changed their minds and want patient to go to SNF, per [facility], she has a bed but facility will accept her tomorrow.  GOC: Patient was very much alert yesterday, she clearly told me that if she were to be near the death, she would like to go home and die.  Palliative care was consulted.  However I spoke to her personally this morning, she said that palliative care had already talked to her and she said she thinks she is nearing death and would not want to live on the machine.  I tried to ask her to clarify CODE STATUS as she is full code at the moment.  She kept saying  I do not know what to answer you but I just do not want to be living off of machines at the end of the conversation, she could not make any firm decision.  Palliative care team tried to talk to the family however they are not receptive of the ideas of comfort care or event DNR, they want patient to be full code at the moment.  DVT prophylaxis: apixaban  (ELIQUIS ) tablet 2.5 mg Start: 01/24/24 2200Heparin   Code Status: Full Code  Family Communication: None at the bedside today, I spoke to patient's daughter-in-law over the speaker phone while in patient's room.  Status is: Inpatient Remains inpatient appropriate because: Patient now medically stable, pending placement.   Estimated body mass index is 20.6 kg/m as calculated from the  following:   Height as of this encounter: 5' 2 (1.575 m).   Weight as of this encounter: 51.1 kg.    Nutritional Assessment: Body mass index is 20.6 kg/m.[name] Seen by dietician.  I agree with the assessment and plan as outlined below: Nutrition Status: Nutrition Problem: Inadequate oral intake Etiology: decreased appetite, acute illness Signs/Symptoms: per patient/family report Interventions: Ensure Enlive (each supplement provides 350kcal and 20 grams of protein)  . Skin Assessment: I have examined the patient's skin and I agree with the wound assessment as performed by the wound care RN as outlined below:    Consultants:  Cardiology  Procedures:  Above  Antimicrobials:  Anti-infectives (From admission, onward)    Start     Dose/Rate Route Frequency Ordered Stop   01/25/24 1000  Ampicillin -Sulbactam (UNASYN ) 3 g in sodium chloride  0.9 % 100 mL IVPB        3 g 200 mL/hr over 30 Minutes Intravenous Every 12 hours 01/25/24 0909     01/24/24 1245  ceFAZolin  (ANCEF ) IVPB 1 g/50 mL premix  Status:  Discontinued        1 g 100 mL/hr over 30 Minutes Intravenous Every 8 hours 01/24/24 1156 01/24/24 1322   01/24/24 1000  ceFAZolin  (ANCEF ) IVPB 1 g/50 mL premix  Status:  Discontinued  1 g 100 mL/hr over 30 Minutes Intravenous Every 8 hours 01/24/24 0809 01/24/24 1156         Subjective: Patient seen and examined, she was much more alert and oriented today.  She had no new complaint today.  Objective: Vitals:   01/26/24 0421 01/26/24 0727 01/26/24 0807 01/26/24 1145  BP: (!) 168/65 (!) 130/51  (!) 141/57  Pulse: 85 67  81  Resp:  16  16  Temp: 99.7 F (37.6 C)     TempSrc: Oral   Oral  SpO2: 94% 90% 91% 97%  Weight:      Height:        Intake/Output Summary (Last 24 hours) at 01/26/2024 1405 Last data filed at 01/26/2024 1011 Gross per 24 hour  Intake 1374 ml  Output 1450 ml  Net -76 ml   Filed Weights   01/15/24 2210 01/20/24 2223 01/21/24 0852  Weight:  47.7 kg 51.1 kg 51.1 kg    Examination:  General exam: Appears calm and comfortable  Respiratory system: Clear to auscultation. Respiratory effort normal. Cardiovascular system: S1 & S2 heard, RRR. No JVD, murmurs, rubs, gallops or clicks. No pedal edema. Gastrointestinal system: Abdomen is nondistended, soft and nontender. No organomegaly or masses felt. Normal bowel sounds heard. Central nervous system: Alert and oriented. No focal neurological deficits. Extremities: Symmetric 5 x 5 power. Skin: No rashes, lesions or ulcers.  Psychiatry: Judgement and insight appear normal. Mood & affect appropriate.   Data Reviewed: I have personally reviewed following labs and imaging studies  CBC: Recent Labs  Lab 01/22/24 0536 01/23/24 0531 01/24/24 0552 01/25/24 0108 01/26/24 0525  WBC 16.4* 18.2* 17.2* 20.2* 17.9*  NEUTROABS  --   --   --   --  14.1*  HGB 9.2* 8.0* 7.5* 7.9* 7.5*  HCT 28.4* 24.8* 22.9* 24.6* 22.8*  MCV 102.5* 101.6* 101.3* 101.7* 100.0  PLT 264 222 244 302 323   Basic Metabolic Panel: Recent Labs  Lab 01/20/24 0541 01/21/24 0430 01/21/24 0652 01/22/24 0536 01/23/24 0531 01/24/24 0552 01/25/24 1102 01/26/24 0525  NA 130* 130*   < > 130* 130* 140 141 138  K 4.4 4.6   < > 4.8 3.7 3.6 3.0* 4.0  CL 96* 94*   < > 95* 103 111 111 109  CO2 23 21*   < > 20* 17* 17* 19* 21*  GLUCOSE 131* 113*   < > 93 121* 117* 174* 116*  BUN 22 29*   < > 33* 30* 22 19 15   CREATININE 1.10* 1.66*   < > 1.61* 1.47* 1.23* 1.32* 1.29*  CALCIUM  7.9* 8.1*   < > 7.9* 7.2* 7.5* 7.9* 7.5*  MG 1.9 1.9  --   --   --   --   --  1.1*  PHOS 3.3 2.9  --   --   --   --   --   --    < > = values in this interval not displayed.   GFR: Estimated Creatinine Clearance: 27.5 mL/min (A) (by C-G formula based on SCr of 1.29 mg/dL (H)). Liver Function Tests: No results for input(s): AST, ALT, ALKPHOS, BILITOT, PROT, ALBUMIN in the last 168 hours.  No results for input(s): LIPASE,  AMYLASE in the last 168 hours. No results for input(s): AMMONIA in the last 168 hours. Coagulation Profile: No results for input(s): INR, PROTIME in the last 168 hours. Cardiac Enzymes: No results for input(s): CKTOTAL, CKMB, CKMBINDEX, TROPONINI in the last 168 hours.  BNP (last 3 results) No results for input(s): PROBNP in the last 8760 hours. HbA1C: No results for input(s): HGBA1C in the last 72 hours. CBG: No results for input(s): GLUCAP in the last 168 hours. Lipid Profile: No results for input(s): CHOL, HDL, LDLCALC, TRIG, CHOLHDL, LDLDIRECT in the last 72 hours. Thyroid  Function Tests: No results for input(s): TSH, T4TOTAL, FREET4, T3FREE, THYROIDAB in the last 72 hours.  Anemia Panel: No results for input(s): VITAMINB12, FOLATE, FERRITIN, TIBC, IRON, RETICCTPCT in the last 72 hours. Sepsis Labs: No results for input(s): PROCALCITON, LATICACIDVEN in the last 168 hours.  No results found for this or any previous visit (from the past 240 hours).   Radiology Studies: DG CHEST PORT 1 VIEW Result Date: 01/24/2024 CLINICAL DATA:  Shortness of breath EXAM: PORTABLE CHEST 1 VIEW COMPARISON:  01/23/2024 FINDINGS: Cardiac shadow is stable. Increasing bilateral pleural effusions are noted. Increasing central vascular congestion with interstitial edema is noted as well. No bony abnormality is noted. IMPRESSION: Worsening CHF with bilateral effusions. Electronically Signed   By: Oneil Devonshire M.D.   On: 01/24/2024 22:50    Scheduled Meds:  amiodarone   200 mg Oral BID   apixaban   2.5 mg Oral BID   feeding supplement  237 mL Oral TID BM   furosemide   20 mg Oral Daily   lactulose   20 g Oral QHS   midodrine   5 mg Oral TID WC   pantoprazole   40 mg Oral Daily   QUEtiapine   25 mg Oral Q24H   rosuvastatin   40 mg Oral Daily   sodium chloride  flush  3 mL Intravenous Q12H   Continuous Infusions:  ampicillin -sulbactam (UNASYN ) IV 3 g  (01/26/24 1054)      LOS: 5 days   Fredia Skeeter, MD [facility]  01/26/2024, 2:05 PM   *Please note that this is a verbal dictation therefore any spelling or grammatical errors are due to the Dragon Medical One system interpretation.  Please page via Amion and do not message via secure chat for urgent patient care matters. Secure chat can be used for non urgent patient care matters.  How to contact the TRH Attending or Consulting provider 7A - 7P or covering provider during after hours 7P -7A, for this patient?  Check the care team in [facility] and look for a) attending/consulting TRH provider listed and b) the TRH team listed. Page or secure chat 7A-7P. Log into [email or web address] and use [place]'s universal password to access. If you do not have the password, please contact the hospital operator. Locate the TR[facility] provider you are looking for under [facility] and page to a number that you can be directly reached. If you still have difficulty reaching the provider, please page the [facility] (Director on Call) for the Hospitalists listed on amion for assistance.

## 2024-01-26 NOTE — TOC Progression Note (Addendum)
 Transition of Care [facility]) - Progression Note    Patient Details  Name: [name] MRN: [id number] Date of Birth: 03-Oct-1940  Transition of Care [facility]) CM/SW Contact  Isaiah Public, LCSWA Phone Number: 01/26/2024, 9:48 AM  Clinical Narrative:     CSW LVM with Madelin and Kerri with [facility]. CSW awaiting call back to see if [facility] can offer SNF bed for patient. CSW will continue to follow.  Update- CSW spoke with Kerri with [facility] who is going to review referral and let CSW if facility can make SNF bed offer.  Update- [facility] unable to offer SNF bed for patient. CSW informed patient and patients son and daughter n law [name] at bedside. CSW provided family with additional SNF bed offers for review. Family and patient request to see if [facility] can offer. CSW spoke with Destiny with [facility] and she informed CSW that facility reviewing referral and will give CSW a call back on if they can offer SNF bed for patient. CSW will continue to follow.  Update- Patients first choice is [facility], 2nd choice is [facility], 3rd choice is [facility]. CSW awaiting to hear back from [facility] and [facility] on if they can offer SNF bed for patient.  Update- Destiny with [facility] confirmed they can offer SNF bed for patient. Patient accepted SNF bed offer. Destiny with [facility] confirmed they can accept patient tomorrow if medically ready and insurance authorization approved for dc. CSW informed MD. CSW informed Jon CMA of patients SNF choice. Jon will add patients facility choice to patients insurance authorization request for SNF. Patients insurance authorization currently pending.  Update- Jon CMA informed CSW that patients insurance authorization has been approved.  Insurance authorization approved from 9/17 - 9/19 Plan Auth ID# [id number]. Facility can accept patient tomorrow if medically ready. CSW informed MD.     Expected Discharge Plan:  (TBD patient  wants CIR and if not CIR wants to return home with HHPT) Barriers to Discharge: Continued Medical Work up               Expected Discharge Plan and Services In-house Referral: Clinical Social Work Discharge Planning Services: NA Post Acute Care Choice: Home Health Living arrangements for the past 2 months: Single Family Home                 DME Arranged: N/A DME Agency: NA       HH Arranged: PT, OT HH Agency: [facility] Date HH Agency Contacted: 01/20/24 Time HH Agency Contacted: 1457 Representative spoke with at [facility] Agency: Lauraine   Social Drivers of Health (SDOH) Interventions SDOH Screenings   Food Insecurity: No Food Insecurity (01/15/2024)  Housing: Low Risk  (01/15/2024)  Transportation Needs: No Transportation Needs (01/15/2024)  Utilities: Not At Risk (01/15/2024)  Alcohol Screen: Low Risk  (09/18/2022)  Depression (PHQ2-9): Low Risk  (12/13/2023)  Financial Resource Strain: Low Risk  (09/18/2022)  Physical Activity: Sufficiently Active (09/18/2022)  Social Connections: Moderately Isolated (01/15/2024)  Stress: No Stress Concern Present (09/18/2022)  Tobacco Use: Medium Risk (01/16/2024)    Readmission Risk Interventions     No data to display

## 2024-01-26 NOTE — Progress Notes (Signed)
 Mobility Specialist Progress Note:   01/26/24 0945  Mobility  Activity Pivoted/transferred to/from [facility]  Level of Assistance Contact guard assist, steadying assist  Assistive Device Front wheel walker  Distance Ambulated (ft) 3 ft  Activity Response Tolerated well  Mobility Referral Yes  Mobility visit 1 Mobility  Mobility Specialist Start Time (ACUTE ONLY) 0945  Mobility Specialist Stop Time (ACUTE ONLY) 1000  Mobility Specialist Time Calculation (min) (ACUTE ONLY) 15 min   Pt received on BSC, ready to transfer back to chair. Required CGA to stand and pivot to chair with RW. No c/o throughout, PT session soon.  [name] Mobility Specialist Please contact via SecureChat or  Rehab office at [phone]

## 2024-01-26 NOTE — Progress Notes (Addendum)
 Physical Therapy Treatment Patient Details Name: [name] MRN: [id number] DOB: [birth year] Today's Date: 01/26/2024   History of Present Illness Pt is is a 83 y.o. female admitted to [facility] 9/6 for  fall  likely d/t orthostasis. Pt experiencing afib, transferred to [facility] 9/11 s/p [facility] 9/12. Cardiac MRI 9/15 with results pending at time of session. PMH: PAF, osteoporosis, HLD, chronic back pain.    PT Comments  The pt was agreeable to session, reports feeling better today but that she is still hoping for a little post-acute rehab prior to return home alone. The pt was able to make good progress with ambulation distance this morning, but did benefit from chair follow for seated rest after 75 ft and minA to steady with mild sway. Pt remains at high risk for falls, but has made great progress with activity tolerance both for exercises and gait progression. Given pt goal is to return home alone eventually but has great family support and has had a significant decline in functional mobility, she will benefit from intensive post-acute therapies after d/c >3hours/day.    If plan is discharge home, recommend the following: A little help with bathing/dressing/bathroom;Assistance with cooking/housework;Assist for transportation;A lot of help with walking and/or transfers;Help with stairs or ramp for entrance   Can travel by private vehicle     Yes  Equipment Recommendations  None recommended by PT (pt has needed DME)    Recommendations for Other Services Rehab consult     Precautions / Restrictions Precautions Precautions: Fall Recall of Precautions/Restrictions: Intact Precaution/Restrictions Comments: orthostatic hypotension Restrictions Weight Bearing Restrictions Per Provider Order: No     Mobility  Bed Mobility Overal bed mobility: Needs Assistance             General bed mobility comments: pt OOB in chair at start and end of session    Transfers Overall transfer level: Needs  assistance Equipment used: Rolling walker (2 wheels) Transfers: Sit to/from Stand, Bed to chair/wheelchair/BSC Sit to Stand: Min assist, Contact guard assist   Step pivot transfers: Contact guard assist       General transfer comment: minA to CGA at times, completed x6 in session. remains dependent on BUE support    Ambulation/Gait Ambulation/Gait assistance: Min assist, +2 safety/equipment (chair follow) Gait Distance (Feet): 75 Feet (+ 75 ft) Assistive device: Rolling walker (2 wheels) Gait Pattern/deviations: Step-to pattern, Step-through pattern, Decreased stride length, Decreased dorsiflexion - right, Decreased dorsiflexion - left, Shuffle, Narrow base of support Gait velocity: decreased Gait velocity interpretation: <1.31 ft/sec, indicative of household ambulator   General Gait Details: pt with small steps and minimal clearance and stride length. cues to improve posture, stride length maintained for short time. chair follow due to quick fatigue. HR <115bpm with gait. 5 min seated rest between bouts      Balance Overall balance assessment: Needs assistance Sitting-balance support: Feet supported, Single extremity supported Sitting balance-Leahy Scale: Fair Sitting balance - Comments: 1 UE support   Standing balance support: Bilateral upper extremity supported, During functional activity, Reliant on assistive device for balance Standing balance-Leahy Scale: Poor Standing balance comment: RW reliant and intermittent minA to steady sway                            Communication Communication Communication: No apparent difficulties  Cognition Arousal: Alert Behavior During Therapy: WFL for tasks assessed/performed   PT - Cognitive impairments: No apparent impairments  PT - Cognition Comments: able to follow commands in session, not formally tested. delayed processing at times. Following commands: Intact      Cueing Cueing  Techniques: Verbal cues, Gestural cues  Exercises Other Exercises Other Exercises: seated calf raises x15, seated marches x10, sit-stand from chair x4    General Comments        Pertinent Vitals/Pain Pain Assessment Pain Assessment: Faces Faces Pain Scale: Hurts even more Pain Location: R breast (burning), back Pain Descriptors / Indicators: Aching, Discomfort, Grimacing, Guarding, Burning Pain Intervention(s): Limited activity within patient's tolerance, Monitored during session, Repositioned, Patient requesting pain meds-RN notified     PT Goals (current goals can now be found in the care plan section) Acute Rehab PT Goals Patient Stated Goal: To get stronger before I go home, less pain. PT Goal Formulation: With patient Time For Goal Achievement: 02/01/24 Potential to Achieve Goals: Good Progress towards PT goals: Progressing toward goals    Frequency    Min 3X/week       AM-PAC PT 6 Clicks Mobility   Outcome Measure  Help needed turning from your back to your side while in a flat bed without using bedrails?: A Little Help needed moving from lying on your back to sitting on the side of a flat bed without using bedrails?: A Lot Help needed moving to and from a bed to a chair (including a wheelchair)?: A Little Help needed standing up from a chair using your arms (e.g., wheelchair or bedside chair)?: A Little Help needed to walk in hospital room?: A Little Help needed climbing 3-5 steps with a railing? : A Lot 6 Click Score: 16    End of Session Equipment Utilized During Treatment: Gait belt;Oxygen Activity Tolerance: Patient limited by fatigue;Patient limited by pain Patient left: in chair;with call bell/phone within reach;with chair alarm set;with nursing/sitter in room Nurse Communication: Mobility status;Precautions PT Visit Diagnosis: Unsteadiness on feet (R26.81);Other abnormalities of gait and mobility (R26.89);Pain Pain - Right/Left: Right Pain - part of  body:  (breast pain; chronic back pain also)     Time: 9057-8989 PT Time Calculation (min) (ACUTE ONLY): 28 min  Charges:    $Gait Training: 8-22 mins $Therapeutic Exercise: 8-22 mins PT General Charges $$ ACUTE PT VISIT: 1 Visit                     Izetta Call, PT, DPT   Acute Rehabilitation Department Office [phone] Secure Chat Communication Preferred   Izetta JULIANNA Call 01/26/2024, 2:10 PM

## 2024-01-27 ENCOUNTER — Other Ambulatory Visit: Payer: Self-pay

## 2024-01-27 DIAGNOSIS — N1831 Chronic kidney disease, stage 3a: Secondary | ICD-10-CM | POA: Diagnosis not present

## 2024-01-27 DIAGNOSIS — E785 Hyperlipidemia, unspecified: Secondary | ICD-10-CM | POA: Diagnosis not present

## 2024-01-27 DIAGNOSIS — G8929 Other chronic pain: Secondary | ICD-10-CM | POA: Diagnosis not present

## 2024-01-27 DIAGNOSIS — I471 Supraventricular tachycardia, unspecified: Secondary | ICD-10-CM

## 2024-01-27 DIAGNOSIS — M7981 Nontraumatic hematoma of soft tissue: Secondary | ICD-10-CM | POA: Diagnosis not present

## 2024-01-27 DIAGNOSIS — M5459 Other low back pain: Secondary | ICD-10-CM | POA: Diagnosis not present

## 2024-01-27 DIAGNOSIS — I130 Hypertensive heart and chronic kidney disease with heart failure and stage 1 through stage 4 chronic kidney disease, or unspecified chronic kidney disease: Secondary | ICD-10-CM | POA: Diagnosis not present

## 2024-01-27 DIAGNOSIS — M5441 Lumbago with sciatica, right side: Secondary | ICD-10-CM | POA: Diagnosis not present

## 2024-01-27 DIAGNOSIS — K219 Gastro-esophageal reflux disease without esophagitis: Secondary | ICD-10-CM | POA: Diagnosis not present

## 2024-01-27 DIAGNOSIS — M5442 Lumbago with sciatica, left side: Secondary | ICD-10-CM | POA: Diagnosis not present

## 2024-01-27 DIAGNOSIS — S8002XA Contusion of left knee, initial encounter: Secondary | ICD-10-CM | POA: Diagnosis not present

## 2024-01-27 DIAGNOSIS — M6281 Muscle weakness (generalized): Secondary | ICD-10-CM | POA: Diagnosis not present

## 2024-01-27 DIAGNOSIS — I480 Paroxysmal atrial fibrillation: Secondary | ICD-10-CM | POA: Diagnosis not present

## 2024-01-27 DIAGNOSIS — E871 Hypo-osmolality and hyponatremia: Secondary | ICD-10-CM | POA: Diagnosis not present

## 2024-01-27 DIAGNOSIS — I5033 Acute on chronic diastolic (congestive) heart failure: Secondary | ICD-10-CM | POA: Diagnosis not present

## 2024-01-27 DIAGNOSIS — M1990 Unspecified osteoarthritis, unspecified site: Secondary | ICD-10-CM | POA: Diagnosis not present

## 2024-01-27 DIAGNOSIS — S01112A Laceration without foreign body of left eyelid and periocular area, initial encounter: Secondary | ICD-10-CM | POA: Diagnosis not present

## 2024-01-27 DIAGNOSIS — Z7401 Bed confinement status: Secondary | ICD-10-CM | POA: Diagnosis not present

## 2024-01-27 DIAGNOSIS — S0990XD Unspecified injury of head, subsequent encounter: Secondary | ICD-10-CM | POA: Diagnosis not present

## 2024-01-27 DIAGNOSIS — K3184 Gastroparesis: Secondary | ICD-10-CM | POA: Diagnosis not present

## 2024-01-27 DIAGNOSIS — Z743 Need for continuous supervision: Secondary | ICD-10-CM | POA: Diagnosis not present

## 2024-01-27 DIAGNOSIS — D649 Anemia, unspecified: Secondary | ICD-10-CM | POA: Diagnosis not present

## 2024-01-27 DIAGNOSIS — I951 Orthostatic hypotension: Secondary | ICD-10-CM | POA: Diagnosis not present

## 2024-01-27 DIAGNOSIS — R296 Repeated falls: Secondary | ICD-10-CM | POA: Diagnosis not present

## 2024-01-27 DIAGNOSIS — I472 Ventricular tachycardia, unspecified: Secondary | ICD-10-CM | POA: Diagnosis not present

## 2024-01-27 DIAGNOSIS — M810 Age-related osteoporosis without current pathological fracture: Secondary | ICD-10-CM | POA: Diagnosis not present

## 2024-01-27 DIAGNOSIS — J449 Chronic obstructive pulmonary disease, unspecified: Secondary | ICD-10-CM | POA: Diagnosis not present

## 2024-01-27 DIAGNOSIS — R531 Weakness: Secondary | ICD-10-CM | POA: Diagnosis not present

## 2024-01-27 DIAGNOSIS — R6889 Other general symptoms and signs: Secondary | ICD-10-CM | POA: Diagnosis not present

## 2024-01-27 DIAGNOSIS — N179 Acute kidney failure, unspecified: Secondary | ICD-10-CM | POA: Diagnosis not present

## 2024-01-27 DIAGNOSIS — Z9889 Other specified postprocedural states: Secondary | ICD-10-CM | POA: Diagnosis not present

## 2024-01-27 DIAGNOSIS — I5031 Acute diastolic (congestive) heart failure: Secondary | ICD-10-CM | POA: Diagnosis not present

## 2024-01-27 DIAGNOSIS — E860 Dehydration: Secondary | ICD-10-CM | POA: Diagnosis not present

## 2024-01-27 DIAGNOSIS — D72829 Elevated white blood cell count, unspecified: Secondary | ICD-10-CM | POA: Diagnosis not present

## 2024-01-27 DIAGNOSIS — R54 Age-related physical debility: Secondary | ICD-10-CM | POA: Diagnosis not present

## 2024-01-27 DIAGNOSIS — Z7901 Long term (current) use of anticoagulants: Secondary | ICD-10-CM | POA: Diagnosis not present

## 2024-01-27 LAB — CBC
HCT: 24.7 % — ABNORMAL LOW (ref 36.0–46.0)
Hemoglobin: 8.2 g/dL — ABNORMAL LOW (ref 12.0–15.0)
MCH: 33.2 pg (ref 26.0–34.0)
MCHC: 33.2 g/dL (ref 30.0–36.0)
MCV: 100 fL (ref 80.0–100.0)
Platelets: 364 K/uL (ref 150–400)
RBC: 2.47 MIL/uL — ABNORMAL LOW (ref 3.87–5.11)
RDW: 13.2 % (ref 11.5–15.5)
WBC: 20.7 K/uL — ABNORMAL HIGH (ref 4.0–10.5)
nRBC: 0.1 % (ref 0.0–0.2)

## 2024-01-27 LAB — MAGNESIUM: Magnesium: 1.8 mg/dL (ref 1.7–2.4)

## 2024-01-27 MED ORDER — HYDROCODONE-ACETAMINOPHEN 10-325 MG PO TABS: ORAL_TABLET | ORAL | 0 refills | Status: DC

## 2024-01-27 MED ORDER — AMIODARONE HCL 200 MG PO TABS: ORAL_TABLET | ORAL | 0 refills | Status: DC

## 2024-01-27 MED ORDER — FUROSEMIDE 20 MG PO TABS: 20.0000 mg | ORAL_TABLET | Freq: Every day | ORAL | Status: DC

## 2024-01-27 MED ORDER — AMOXICILLIN-POT CLAVULANATE 875-125 MG PO TABS: 1.0000 | ORAL_TABLET | Freq: Two times a day (BID) | ORAL | 0 refills | Status: AC

## 2024-01-27 NOTE — TOC Progression Note (Signed)
 Transition of Care [facility]) - Progression Note    Patient Details  Name: [name] MRN: [id number] Date of Birth: May 01, 1941  Transition of Care [facility]) CM/SW Contact  Isaiah Public, LCSWA Phone Number: 01/27/2024, 10:23 AM  Clinical Narrative:     Destiny with [facility] confirmed facility can accept patient today if medically ready. CSW informed MD.   Expected Discharge Plan:  (TBD patient wants CIR and if not CIR wants to return home with HHPT) Barriers to Discharge: Continued Medical Work up               Expected Discharge Plan and Services In-house Referral: Clinical Social Work Discharge Planning Services: NA Post Acute Care Choice: Home Health Living arrangements for the past 2 months: Single Family Home Expected Discharge Date: 01/27/24               DME Arranged: N/A DME Agency: NA       HH Arranged: PT, OT HH Agency: [facility] Date HH Agency Contacted: 01/20/24 Time HH Agency Contacted: 1457 Representative spoke with at [facility] Agency: Lauraine   Social Drivers of Health (SDOH) Interventions SDOH Screenings   Food Insecurity: No Food Insecurity (01/15/2024)  Housing: Low Risk  (01/15/2024)  Transportation Needs: No Transportation Needs (01/15/2024)  Utilities: Not At Risk (01/15/2024)  Alcohol Screen: Low Risk  (09/18/2022)  Depression (PHQ2-9): Low Risk  (12/13/2023)  Financial Resource Strain: Low Risk  (09/18/2022)  Physical Activity: Sufficiently Active (09/18/2022)  Social Connections: Moderately Isolated (01/15/2024)  Stress: No Stress Concern Present (09/18/2022)  Tobacco Use: Medium Risk (01/16/2024)    Readmission Risk Interventions     No data to display

## 2024-01-27 NOTE — TOC Transition Note (Signed)
 Transition of Care [facility]) - Discharge Note   Patient Details  Name: [name] MRN: [id number] Date of Birth: 1940-08-10  Transition of Care [facility]) CM/SW Contact:  Isaiah Public, LCSWA Phone Number: 01/27/2024, 10:44 AM   Clinical Narrative:     Patient will DC to: [facility]   Anticipated DC date: 01/27/2024  Family notified: [name]   Transport by: [facility]  ?  Per MD patient ready for DC to [facility] . RN, patient, patient's family, and facility notified of DC. Discharge Summary sent to facility. RN given number for report [phone] [id number], RM# [id number] .DC packet on chart. Ambulance transport requested for patient.  CSW signing off.    Final next level of care: Skilled Nursing Facility Barriers to Discharge: No Barriers Identified   Patient Goals and CMS Choice Patient states their goals for this hospitalization and ongoing recovery are:: SNF CMS Medicare.gov Compare Post Acute Care list provided to:: Patient Choice offered to / list presented to : Patient, Adult Children (patient and son darrell and daughter-n-law [name]) [place] ownership interest in [facility].provided to::  (NA)    Discharge Placement              Patient chooses bed at:  [facility]) Patient to be transferred to facility by: PTAR Name of family member notified: [name] Patient and family notified of of transfer: 01/27/24  Discharge Plan and Services Additional resources added to the After Visit Summary for   In-house Referral: Clinical Social Work Discharge Planning Services: NA Post Acute Care Choice: Home Health          DME Arranged: N/A DME Agency: NA       HH Arranged: PT, OT HH Agency: [facility] Date [facility] Agency Contacted: 01/20/24 Time HH Agency Contacted: 1457 Representative spoke with at [facility] Agency: Lauraine  Social Drivers of Health (SDOH) Interventions SDOH Screenings   Food Insecurity: No Food Insecurity (01/15/2024)   Housing: Low Risk  (01/15/2024)  Transportation Needs: No Transportation Needs (01/15/2024)  Utilities: Not At Risk (01/15/2024)  Alcohol Screen: Low Risk  (09/18/2022)  Depression (PHQ2-9): Low Risk  (12/13/2023)  Financial Resource Strain: Low Risk  (09/18/2022)  Physical Activity: Sufficiently Active (09/18/2022)  Social Connections: Moderately Isolated (01/15/2024)  Stress: No Stress Concern Present (09/18/2022)  Tobacco Use: Medium Risk (01/16/2024)     Readmission Risk Interventions     No data to display

## 2024-01-27 NOTE — Progress Notes (Signed)
 Progress Note  Patient Name: [name] Date of Encounter: 01/27/2024 Primary Cardiologist: Alvan Carrier, MD   Subjective    No SOB. No sx when having paroxysms of SVT.  Vital Signs    Vitals:   01/26/24 2015 01/26/24 2325 01/27/24 0458 01/27/24 0805  BP:  (!) 143/55 (!) 157/79   Pulse: 76 69 81   Resp:  18 16 18   Temp:  97.8 F (36.6 C) 98.9 F (37.2 C) 98.2 F (36.8 C)  TempSrc:  Oral Oral   SpO2: 93% 96% 96%   Weight:      Height:        Intake/Output Summary (Last 24 hours) at 01/27/2024 1048 Last data filed at 01/27/2024 0459 Gross per 24 hour  Intake 237 ml  Output 2100 ml  Net -1863 ml   Filed Weights   01/15/24 2210 01/20/24 2223 01/21/24 0852  Weight: 47.7 kg 51.1 kg 51.1 kg    Physical Exam   GEN: No acute distress.  Facial brusing has improved. Neck: No JVD Cardiac: RRR, no murmurs, rubs, or gallops.  Tachycardia when ambulating the halls Respiratory: No increased work of breathing GI: Soft, nontender, non-distended  MS: No edema  Labs   Telemetry: SR    Chemistry Recent Labs  Lab 01/24/24 0552 01/25/24 1102 01/26/24 0525  NA 140 141 138  K 3.6 3.0* 4.0  CL 111 111 109  CO2 17* 19* 21*  GLUCOSE 117* 174* 116*  BUN 22 19 15   CREATININE 1.23* 1.32* 1.29*  CALCIUM  7.5* 7.9* 7.5*  GFRNONAA 44* 41* 42*  ANIONGAP 12 11 8      Hematology Recent Labs  Lab 01/25/24 0108 01/26/24 0525 01/27/24 0442  WBC 20.2* 17.9* 20.7*  RBC 2.42* 2.28* 2.47*  HGB 7.9* 7.5* 8.2*  HCT 24.6* 22.8* 24.7*  MCV 101.7* 100.0 100.0  MCH 32.6 32.9 33.2  MCHC 32.1 32.9 33.2  RDW 13.2 13.3 13.2  PLT 302 323 364     BNP Recent Labs  Lab 01/25/24 0108  BNP 1,072.2*      Cardiac Studies   Cardiac Studies & Procedures   ______________________________________________________________________________________________ CARDIAC CATHETERIZATION  CARDIAC CATHETERIZATION 01/21/2024  Conclusion Table formatting from the original result was not  included. Images from the original result were not included. Dominance: Right -> Angiographically Normal Coronary Arteries.  Normal LVEDP.  Nonischemic etiology for VT  RECOMMENDATIONS   Anticipated discharge date to be determined.   Recommend to resume Apixaban , at currently prescribed dose and frequency on 01/22/2024.   Could reinitiate Apixaban  as early as a.m. of 01/22/2024 versus placed back on IV heparin  pending further evaluation.    Alm Clay, MD  Findings Coronary Findings Diagnostic  Dominance: Right  Left Main Vessel was injected. Vessel is normal in caliber. Vessel is angiographically normal.  Left Anterior Descending Vessel was injected. Vessel is normal in caliber. Vessel is angiographically normal. The vessel is mildly tortuous.  Second Diagonal Branch Vessel is small in size.  Left Circumflex Vessel was injected. Vessel is normal in caliber and moderate in size.  First Obtuse Marginal Branch Vessel is small in size.  Second Obtuse Marginal Branch Vessel is large in size. Vessel is angiographically normal. The vessel is tortuous. Tortuous distal vessel  Third Obtuse Marginal Branch Vessel is large in size.  Right Coronary Artery Vessel was injected. Vessel is normal in caliber. Vessel is angiographically normal.  Acute Marginal Branch Vessel is small in size.  Right Ventricular Branch Vessel is small  in size.  Right Posterior Descending Artery Vessel is small in size.  Intervention  No interventions have been documented.   STRESS TESTS  NM MYOCAR MULTI W/SPECT W 08/31/2017  Narrative  There was no ST segment deviation noted during stress.  The study is normal. There are no perfusion defects consistent with prior infarct or current ischemia.  This is a low risk study.  The left ventricular ejection fraction is normal (55-65%).   ECHOCARDIOGRAM  ECHOCARDIOGRAM COMPLETE 01/19/2024  Narrative ECHOCARDIOGRAM REPORT    Patient  Name:   [name] Date of Exam: 01/19/2024 Medical Rec #:  [id number]     Height:       62.0 in Accession #:    [id number]    Weight:       105.2 lb Date of Birth:  October 16, 1940    BSA:          1.455 m Patient Age:    82 years      BP:           140/57 mmHg Patient Gender: F             HR:           80 bpm. Exam Location:  [name]  Procedure: 2D Echo, Cardiac Doppler and Color Doppler (Both Spectral and Color Flow Doppler were utilized during procedure).  Indications:    Ventricular Tachycardia l47.2  History:        Patient has prior history of Echocardiogram examinations, most recent 09/24/2022. Arrythmias:Atrial Fibrillation; Risk Factors:Dyslipidemia, Former Smoker and Pre-diabetes. Hx of COVID-19.  Sonographer:    Aida Pizza RCS Referring Phys: [id number] [name]  IMPRESSIONS   1. Left ventricular ejection fraction, by estimation, is 60 to 65%. The left ventricle has normal function. The left ventricle has no regional wall motion abnormalities. Left ventricular diastolic parameters were normal. 2. Right ventricular systolic function is normal. The right ventricular size is normal. There is normal pulmonary artery systolic pressure. The estimated right ventricular systolic pressure is 33.2 mmHg. 3. A small pericardial effusion is present. The pericardial effusion is posterior to the left ventricle. 4. The mitral valve is grossly normal. Mild mitral valve regurgitation. 5. Tricuspid valve regurgitation is moderate. 6. The aortic valve has an indeterminant number of cusps, cannot exclude functionally bicuspid valve. Aortic valve regurgitation is not visualized. No aortic stenosis is present. Aortic valve mean gradient measures 7.0 mmHg. Calification noted within LVOT and annulus. 7. The inferior vena cava is normal in size with greater than 50% respiratory variability, suggesting right atrial pressure of 3 mmHg.  Comparison(s): Prior images reviewed side by side.  LVEF 60-65%. Possible functionally bicuspid aortic valve without stenosis.  FINDINGS Left Ventricle: Left ventricular ejection fraction, by estimation, is 60 to 65%. The left ventricle has normal function. The left ventricle has no regional wall motion abnormalities. The left ventricular internal cavity size was normal in size. There is borderline left ventricular hypertrophy. Left ventricular diastolic parameters were normal.  Right Ventricle: The right ventricular size is normal. No increase in right ventricular wall thickness. Right ventricular systolic function is normal. There is normal pulmonary artery systolic pressure. The tricuspid regurgitant velocity is 2.75 m/s, and with an assumed right atrial pressure of 3 mmHg, the estimated right ventricular systolic pressure is 33.2 mmHg.  Left Atrium: Left atrial size was normal in size.  Right Atrium: Right atrial size was normal in size.  Pericardium: A small pericardial effusion is present. The pericardial  effusion is posterior to the left ventricle.  Mitral Valve: The mitral valve is grossly normal. Mild mitral valve regurgitation.  Tricuspid Valve: The tricuspid valve is grossly normal. Tricuspid valve regurgitation is moderate.  Aortic Valve: The aortic valve has an indeterminant number of cusps. Aortic valve regurgitation is not visualized. No aortic stenosis is present. Aortic valve mean gradient measures 7.0 mmHg. Aortic valve peak gradient measures 14.3 mmHg. Aortic valve area, by VTI measures 1.20 cm.  Pulmonic Valve: The pulmonic valve was grossly normal. Pulmonic valve regurgitation is trivial.  Aorta: The aortic root is normal in size and structure.  Venous: The inferior vena cava is normal in size with greater than 50% respiratory variability, suggesting right atrial pressure of 3 mmHg.  IAS/Shunts: No atrial level shunt detected by color flow Doppler.  Additional Comments: 3D was performed not requiring image post  processing on an independent workstation and was indeterminate.   LEFT VENTRICLE PLAX 2D LVIDd:         4.20 cm   Diastology LVIDs:         2.40 cm   LV e' medial:    12.10 cm/s LV PW:         1.00 cm   LV E/e' medial:  9.0 LV IVS:        0.90 cm   LV e' lateral:   14.20 cm/s LVOT diam:     1.60 cm   LV E/e' lateral: 7.7 LV SV:         53 LV SV Index:   37 LVOT Area:     2.01 cm   RIGHT VENTRICLE RV S prime:     14.00 cm/s TAPSE (M-mode): 2.8 cm  LEFT ATRIUM             Index        RIGHT ATRIUM           Index LA diam:        2.90 cm 1.99 cm/m   RA Area:     12.60 cm LA Vol (A2C):   20.8 ml 14.30 ml/m  RA Volume:   25.70 ml  17.67 ml/m LA Vol (A4C):   32.2 ml 22.14 ml/m LA Biplane Vol: 26.1 ml 17.94 ml/m AORTIC VALVE AV Area (Vmax):    1.16 cm AV Area (Vmean):   1.20 cm AV Area (VTI):     1.20 cm AV Vmax:           189.00 cm/s AV Vmean:          125.000 cm/s AV VTI:            0.444 m AV Peak Grad:      14.3 mmHg AV Mean Grad:      7.0 mmHg LVOT Vmax:         109.00 cm/s LVOT Vmean:        74.900 cm/s LVOT VTI:          0.265 m LVOT/AV VTI ratio: 0.60  AORTA Ao Root diam: 2.80 cm  MITRAL VALVE                TRICUSPID VALVE MV Area (PHT): 3.37 cm     TR Peak grad:   30.2 mmHg MV Decel Time: 225 msec     TR Vmax:        275.00 cm/s MV E velocity: 109.00 cm/s MV A velocity: 88.00 cm/s   SHUNTS MV E/A ratio:  1.24  Systemic VTI:  0.26 m Systemic Diam: 1.60 cm  Jayson Sierras MD Electronically signed by Jayson Sierras MD Signature Date/Time: 01/19/2024/3:00:38 PM    Final    MONITORS  CARDIAC EVENT MONITOR 09/06/2018  Narrative  48 hr holter monitor  Min HR 50, Max HR 103, Avg HR 68  Rare ventricular ectopy, 2 isolated PVCs  Rare supraventricular ectopy. Primarily PACs. Three short runs of atach, longest 4 beats.  Reported symptoms correlated with sinus rhythm     CARDIAC MRI  MR CARDIAC MORPHOLOGY W WO CONTRAST  01/24/2024  Narrative CLINICAL DATA:  Ventricular tachycardia (VT), sustained  EXAM: MR CARDIA MORPHOLOGY WITHOUT AND WITH CONTRAST; MR CARDIAC VELOCITY FLOW MAPPING  TECHNIQUE: The patient was scanned on a 1.5 Tesla Siemens magnet. A dedicated cardiac coil was used. Functional imaging was done using TrueFisp sequences. 2,3, and 4 chamber views were done to assess for RWMA's. Modified Simpson's rule using a short axis stack was used to calculate an ejection fraction on a dedicated work [employer]. The patient received 5mL GADAVIST  GADOBUTROL  1 MMOL/ML IV SOLN. After 10 minutes inversion recovery sequences were used to assess for infiltration and scar tissue. Phase contrast velocity encoded images obtained x 2.  This examination is tailored for evaluation cardiac anatomy and function and provides very limited assessment of noncardiac structures, which are accordingly not evaluated during interpretation. If there is clinical concern for extracardiac pathology, further evaluation with CT imaging should be considered.  FINDINGS: LEFT VENTRICLE: Normal left ventricular size. Maximum septal wall thickness: 0.7 cm. Posterior wall thickness 0.7 cm. Left ventricular internal diameter (diastole): 4.8 cm. There are no regional wall motion abnormalities.  LV EF: 59% (Normal 52-79%)  Absolute volumes:  LV EDV: 93 mL (Normal 78-167 mL)  LV ESV: 38 mL (Normal 21-64 mL)  LV SV: 55 mL (Normal 52-114 mL)  CO: 5.4 L/min (Normal 2.7-6.3 L/min)  Indexed volumes:  LV EDV: 62 mL/sq-m (Normal 50-96 mL/sq-m)  LV ESV: 25 mL/sq-m (Normal 10-40 mL/sq-m)  LV SV: 37 mL/sq-m (Normal 33-64 mL/sq-m)  CI: 3.6 L/min/sq-m (Normal 1.9-3.9 L/min/sq-m)  RIGHT VENTRICLE: Normal right ventricular chamber size. Normal right ventricular wall thickness. Normal right ventricular systolic function. There are no regional wall motion abnormalities.  RV EF: 54% (Normal 52-80%)  Absolute  volumes:  RV EDV: 95 mL (Normal 79-175 mL)  RV ESV: 44 mL (Normal 13-75 mL)  RV SV: 52 mL (Normal 56-110 mL)  CO: 5.1 L/min (Normal 2.7-6 L/min)  Indexed volumes:  RV EDV: 64 mL/sq-m (Normal 51-97 mL/sq-m)  RV ESV: 29 mL/sq-m (Normal 9-42 mL/sq-m)  RV SV: 35 mL/sq-m (Normal 35-61 mL/sq-m)  CI: 3.4 L/min/sq-m (Normal 1.8-3.8 L/min/sq-m)  Left atrium: Normal size.  Right atrium: Normal size.  Mitral valve: Normal mitral valve without regurgitation.  Aortic valve: Grossly normal without regurgitation.  Tricuspid valve: Normal tricuspid valve with mild regurgitation.  Pulmonic valve: Normal pulmonary valve without regurgitation.  Aorta: The aortic root and proximal ascending aorta are normal in diameter.  Pericardium: The pericardium is normal thickness. Trivial pericardial effusion.  There is no evidence of intracardiac thrombus.  Systemic flow across the aortic valve (QS) was 4.5 L/min.  Pulmonary flow across the pulmonic valve (QP) was 5.1  L/min.  The QP:QS calculated across the aortic and pulmonic valves was 1.1.  Native myocardial T1-relaxation times were normal at 1010 ms.  Delayed Enhancement: No delayed enhancement. No evidence of infarction, inflammation, or infiltrative disorder.  Extracardiac structures: Mild bilateral pleural effusions noted.  IMPRESSION: 1. The left ventricle is normal in cavity size and wall thickness. Systolic function was normal with LVEF 59%. No regional wall motion abnormalities.  2. The right ventricle is normal in cavity size and systolic function.  3. No significant valvular heart disease.  4. Delayed enhancement imaging demonstrates no evidence of myocardial infarction, inflammation, or infiltrative disorder.  5. Mild bilateral pleural effusions noted.  Darryle Decent, MD   Electronically Signed By: Darryle Decent M.D. On: 01/24/2024  14:56   ______________________________________________________________________________________________         Assessment & Plan   SOB with new pleural effusion -seen on CXR and CMR; with normal bi-ventricular function, likely non-cardiac and related to volume resuscitation after orthostatic hypotension - continue midodrine  - lasix  20 mg PO daily - NYHA I today  WCT with AF - see EP note, plans for amiodarone  and DC of home flecainide  with continued [facility]; she had SVT this AM but has not had any symptoms with this  Follow up 02/23/24 I offered to call [name] and [name] today, she is pending discharge and was ok with me not calling today; I have no therapy changes for today   For questions or updates, please contact [facility] Please consult [email or web address] for contact info under Cardiology/STEMI.      Stanly Leavens, MD FASE [facility] Cardiologist [facility]  [address] [place], [address] [place], [place] [address] [phone]  10:48 AM

## 2024-01-27 NOTE — Progress Notes (Signed)
 PTAR took pt. I called and gave report to Ila, Charity fundraiser at [facility] in [place], [place].

## 2024-01-27 NOTE — Progress Notes (Signed)
 Occupational Therapy Treatment Patient Details Name: [name] MRN: [id number] DOB: 05/21/1940 Today's Date: 01/27/2024   History of present illness Pt is is a 83 y.o. female admitted to [facility] 9/6 for  fall  likely d/t orthostasis. Pt experiencing afib, transferred to [facility] 9/11 s/p LHC 9/12. Cardiac MRI 9/15 with results pending at time of session. PMH: PAF, osteoporosis, HLD, chronic back pain.   OT comments  Pt presented on BSC and required min assist with peri care and droning adult brief. Pt then agreeable with ambulation with CGA, RW and chair follow as pt easily fatigues. Pt reported they could feel when HR elevated in session (Max 136) and was educated about needing to take rest breaks. Patient will benefit from continued inpatient follow up therapy, <3 hours/day.      If plan is discharge home, recommend the following:  A lot of help with walking and/or transfers;A lot of help with bathing/dressing/bathroom;Assistance with feeding;Direct supervision/assist for medications management;Direct supervision/assist for financial management   Equipment Recommendations  Other (comment) (TBD at next venue)    Recommendations for Other Services      Precautions / Restrictions Precautions Precautions: Fall Recall of Precautions/Restrictions: Intact Precaution/Restrictions Comments: orthostatic hypotension/watch HR Restrictions Weight Bearing Restrictions Per Provider Order: No       Mobility Bed Mobility               General bed mobility comments: presented on BSC    Transfers Overall transfer level: Needs assistance Equipment used: Rolling walker (2 wheels) Transfers: Sit to/from Stand Sit to Stand: Contact guard assist                 Balance Overall balance assessment: Needs assistance Sitting-balance support: Feet supported Sitting balance-Leahy Scale: Good     Standing balance support: Bilateral upper extremity supported Standing balance-Leahy Scale:  Fair Standing balance comment: able to complete peri care and assist with donning undergarments                           ADL either performed or assessed with clinical judgement   ADL Overall ADL's : Needs assistance/impaired Eating/Feeding: Independent;Sitting   Grooming: Set up;Sitting   Upper Body Bathing: Contact guard assist;Minimal assistance;Sitting   Lower Body Bathing: Minimal assistance;Sit to/from stand   Upper Body Dressing : Contact guard assist;Sitting   Lower Body Dressing: Minimal assistance;Sit to/from stand   Toilet Transfer: Contact guard assist;Rolling walker (2 wheels)   Toileting- Clothing Manipulation and Hygiene: Minimal assistance;Sit to/from stand       Functional mobility during ADLs: Contact guard assist;Rolling walker (2 wheels)      Extremity/Trunk Assessment Upper Extremity Assessment Upper Extremity Assessment: Generalized weakness            Vision   Additional Comments: Leye swollen   Perception     Praxis     Communication Communication Communication: No apparent difficulties   Cognition Arousal: Alert Behavior During Therapy: WFL for tasks assessed/performed Cognition: No apparent impairments                               Following commands: Intact        Cueing   Cueing Techniques: Verbal cues, Gestural cues  Exercises      Shoulder Instructions       General Comments Max HR to 136    Pertinent Vitals/ Pain  Pain Assessment Pain Assessment: Faces Faces Pain Scale: Hurts little more Pain Location: back Pain Descriptors / Indicators: Aching, Discomfort, Grimacing, Guarding, Burning Pain Intervention(s): Limited activity within patient's tolerance, Monitored during session, Repositioned  Home Living                                          Prior Functioning/Environment              Frequency  Min 2X/week        Progress Toward Goals  OT  Goals(current goals can now be found in the care plan section)  Progress towards OT goals: Progressing toward goals  Acute Rehab OT Goals Patient Stated Goal: to go to reahb OT Goal Formulation: With patient Time For Goal Achievement: 02/01/24 Potential to Achieve Goals: Good ADL Goals Pt Will Perform Grooming: Independently;standing Pt Will Perform Lower Body Dressing: Independently Pt Will Transfer to Toilet: Independently;ambulating Pt Will Perform Toileting - Clothing Manipulation and hygiene: Independently Pt/caregiver will Perform Home Exercise Program: Increased ROM;Increased strength;Right Upper extremity;Left upper extremity;Independently  Plan      Co-evaluation                 AM-PAC OT 6 Clicks Daily Activity     Outcome Measure   Help from another person eating meals?: None Help from another person taking care of personal grooming?: A Little Help from another person toileting, which includes using toliet, bedpan, or urinal?: A Little Help from another person bathing (including washing, rinsing, drying)?: A Lot Help from another person to put on and taking off regular upper body clothing?: A Little Help from another person to put on and taking off regular lower body clothing?: A Little 6 Click Score: 18    End of Session Equipment Utilized During Treatment: Gait belt;Rolling walker (2 wheels)  OT Visit Diagnosis: Unsteadiness on feet (R26.81);Other abnormalities of gait and mobility (R26.89);Muscle weakness (generalized) (M62.81);History of falling (Z91.81);Pain   Activity Tolerance Patient tolerated treatment well   Patient Left in chair;with call bell/phone within reach;with chair alarm set   Nurse Communication Mobility status        Time: 1010-1031 OT Time Calculation (min): 21 min  Charges: OT General Charges $OT Visit: 1 Visit OT Treatments $Self Care/Home Management : 8-22 mins  Warrick POUR OTR/L  Acute Rehab Services  [phone] office  number   Warrick Berber 01/27/2024, 12:43 PM

## 2024-01-27 NOTE — Plan of Care (Signed)
  Problem: Clinical Measurements: Goal: Respiratory complications will improve Outcome: Progressing Goal: Cardiovascular complication will be avoided Outcome: Progressing   Problem: Activity: Goal: Risk for activity intolerance will decrease Outcome: Progressing   Problem: Pain Managment: Goal: General experience of comfort will improve and/or be controlled Outcome: Progressing   Problem: Safety: Goal: Ability to remain free from injury will improve Outcome: Progressing   Problem: Activity: Goal: Ability to return to baseline activity level will improve Outcome: Progressing   Problem: Cardiovascular: Goal: Ability to achieve and maintain adequate cardiovascular perfusion will improve Outcome: Progressing

## 2024-01-27 NOTE — Discharge Summary (Signed)
 Physician Discharge Summary  [name] FMW:[id number] DOB: 05/09/1941 DOA: 01/15/2024  PCP: Alphonsa Glendia LABOR, MD  Admit date: 01/15/2024 Discharge date: 01/27/2024 30 Day Unplanned Readmission Risk Score    Flowsheet Row ED to Hosp-Admission (Current) from 01/15/2024 in [facility]  30 Day Unplanned Readmission Risk Score (%) 23.2 Filed at 01/27/2024 0801    This score is the patient's risk of an unplanned readmission within 30 days of being discharged (0 -100%). The score is based on dignosis, age, lab data, medications, orders, and past utilization.   Low:  0-14.9   Medium: 15-21.9   High: 22-29.9   Extreme: 30 and above          Admitted From: Home Disposition: SNF  Recommendations for Outpatient Follow-up:  Follow up with PCP in 1-2 weeks Please obtain BMP/CBC in one week Follow-up with your cardiologist Follow-up with your cardiologist per schedule Please follow up with your PCP on the following pending results: Unresulted Labs (From admission, onward)    None         Home Health: None Equipment/Devices: None none  Discharge Condition: Stable CODE STATUS: Full code Diet recommendation:  Diet Order             Diet regular Room service appropriate? Yes; Fluid consistency: Thin  Diet effective now                   Subjective: Seen and examined, feeling much better, no complaints.  No shortness of breath.  She is in agreement with going to SNF today.  Brief/Interim Summary: [name] is a 83 y.o. female with medical history significant of PAF on anticoagulation and flecainide , ostoporosis, hyperlipidemia, chronic pain on opiates, long hystory of orthostasis and has been on midodrine  intermittently over the last year presented with fall.  She was admitted to hospitalist service and [facility] for evaluation of fall due to orthostasis.  Due to ventricular tachycardia, seen by cardiology there, they recommended cardiac cath, patient  transferred to [facility] for that.  Details below.   Orthostasis S/p Fall, head trauma- -Long h/o orthostasis, complicated by dehydration.  She is on midodrine  5 mg 3 times daily PTA.  However her blood pressure has remained high at times, with systolic anywhere between 140-160 for most part.  Placed holding parameters to midodrine . - 2D echo demonstrating preserved ejection fraction, no wall motion normalities and no significant valvular disorder. - Continue as needed analgesics.  Orthostatics still positive.  Patient has been educated to be careful and how to prevent falls because this is a longstanding problem for her which may not be fixed.  Also discussed this with the family.   Ventricular tachycardia- Given ongoing changes and intermittent no sustained episodes of ventricular tachycardia decision has been made to proceed with cardiac cath for further evaluation.  Status postcardiac cath 01/21/2024 with normal coronary arteries. cMRI completed 01/24/2024 with LVEF 59% and no wall motion abnormalities. No evidence of infiltrative disease.  Patient maintaining sinus rhythm.   Paroxysmal atrial fibrillation - On Eliquis  and flecainide  PTA, transitioned to heparin  for now per cardiology.  Flecainide  discontinued.  She has now been started on amiodarone  on 01/24/2024.  Maintaining sinus rhythm.  EP saw patient and signed off.  They recommended discharging on amiodarone  at the dose of 200 mg twice daily for total of 2 weeks starting 01/24/2024 followed by 200 mg p.o. daily afterwards.   AKI on CKD stage IIIa/dehydration: Patient's creatinine varies  and ranges anywhere from 0.8-1.2.  It peaked at 1.67, has continued to improve ever since and is back down to 1.2 and has been stable.   Hyponatremia In the setting of dehydration, likely hypovolemic, improved/resolved.   Hypokalemia: Resolved.   Chronic back pain: Resumed her home dose of hydrocodone  medications which she takes every 6 hours as needed.      Leukocytosis/?  Aspiration pneumonia: Initial chest x-ray showed possible bibasilar opacities, atypical infection but pulmonary edema as well.  Patient had no respiratory symptoms until last evening when she started coughing.  Leukocytosis worse however she remains afebrile.  Repeat chest x-ray 01/24/2024 was done due to respiratory distress and hypoxia which shows pulmonary edema and pleural effusion.  Due to worsening leukocytosis, concerned about possible aspiration pneumonia since patient had a fall and syncope and has been in and out of lethargy, raising concern for possible aspiration.  Started on Unasyn .  Patient remained afebrile and she has been weaned to room air but leukocytosis still is high and stable.  Received 3 days of antibiotics, discharging on 4 more days of oral Augmentin .   Volume overload/acute on chronic congestive heart failure with preserved ejection fraction: Overnight on 01/24/2024, patient became hypoxic with respiratory distress.  Chest x-ray was completed which showed worsening CHF and bilateral pleural effusion and BNP was also elevated.  Patient was given couple of doses of IV Lasix , seen by cardiology, they have now transition her to oral Lasix  20 mg daily.  They have cleared her for discharge.   Large bruise/hematoma under left eye: This happened a week ago due to fall.  Nurses have been advised to apply cool compresses every 2-3 hours.  This appears to be improving gradually.   Hypomagnesemia: Replenish yesterday, resolved today.   Debility- Generalized weakness- -Due to poor oral intake. -Lives alone, seen by PT OT, they recommend discharge home with home health however I was concerned about her safety, reconsulted PT OT, they have now updated their recommendation to SNF.  Family was opposed to going to SNF per my discussion with them yesterday however they have now changed their minds and want patient to go to SNF, she is being discharged to SNF in stable condition  today.   GOC: Patient was very much alert yesterday, she clearly told me that if she were to be near the death, she would like to go home and die.  Palliative care was consulted.  However I spoke to her personally this morning, she said that palliative care had already talked to her and she said she thinks she is nearing death and would not want to live on the machine.  I tried to ask her to clarify CODE STATUS as she is full code at the moment.  She kept saying  I do not know what to answer you but I just do not want to be living off of machines at the end of the conversation, she could not make any firm decision.  Palliative care team tried to talk to the family however they are not receptive of the ideas of comfort care or event DNR, they want patient to be full code at the moment.  Discharge plan was discussed with patient and/or family member and they verbalized understanding and agreed with it.  Discharge Diagnoses:  Principal Problem:   Orthostasis Active Problems:   Paroxysmal atrial fibrillation (HCC)   Osteoporosis   Hyponatremia   Leukocytosis    Discharge Instructions   Allergies as of 01/27/2024  Reactions   Flecainide  Other (See Comments)   Rate related aberrancy versus slow VT, asymptomatic        Medication List     STOP taking these medications    flecainide  100 MG tablet Commonly known as: TAMBOCOR        TAKE these medications    alendronate  70 MG tablet Commonly known as: FOSAMAX  TAKE ONE TABLET (70 MG TOTAL) BY MOUTH EVERY 7 DAYS. TAKE WITH A FULL GLASS OF WATER  ON AN EMPTY STOMACH. What changed: See the new instructions.   amiodarone  200 MG tablet Commonly known as: PACERONE  Take 1 tablet (200 mg total) by mouth 2 (two) times daily for 11 days, THEN 1 tablet (200 mg total) daily. Start taking on: January 27, 2024   amoxicillin -clavulanate 875-125 MG tablet Commonly known as: AUGMENTIN  Take 1 tablet by mouth 2 (two) times daily for 4  days.   apixaban  2.5 MG Tabs tablet Commonly known as: ELIQUIS  Take 1 tablet (2.5 mg total) by mouth 2 (two) times daily.   CALCIUM  600 PO Take 600 mg by mouth daily.   Constulose  10 GM/15ML solution Generic drug: lactulose  TAKE 30ML'S (20 GRAMS TOTAL) BY MOUTH ATBEDTIME What changed: See the new instructions.   cyanocobalamin  1000 MCG tablet Commonly known as: VITAMIN B12 Take 1,000 mcg by mouth daily.   ferrous sulfate 325 (65 FE) MG tablet Take 325 mg by mouth daily with breakfast.   furosemide  20 MG tablet Commonly known as: LASIX  Take 1 tablet (20 mg total) by mouth daily. Start taking on: January 28, 2024   HYDROcodone -acetaminophen  10-325 MG tablet Commonly known as: NORCO One qid prn pain What changed:  how much to take how to take this when to take this reasons to take this additional instructions Another medication with the same name was removed. Continue taking this medication, and follow the directions you see here.   midodrine  5 MG tablet Commonly known as: PROAMATINE  Take 1 tablet (5 mg total) by mouth 3 (three) times daily with meals.   omeprazole 20 MG capsule Commonly known as: PRILOSEC Take 20 mg by mouth daily before breakfast.   rosuvastatin  40 MG tablet Commonly known as: CRESTOR  TAKE ONE TABLET (40MG  TOTAL) BY MOUTH DAILY What changed: See the new instructions.        Contact information for follow-up providers     Alphonsa Glendia LABOR, MD .   Specialty: Family Medicine Contact information: [address] [place] [address] [phone]         [facility] [place] Follow up.   Why: [facility] will follow up with you at discharge to provide home health services Contact information: [address] [place] [address] [phone]         [facility] Follow up.   Specialty: Cardiology Why: Davene Nicolas will be mailing you a heart monitor to  wear for 2 weeks. It will come with instructions on how to apply and mail back.  You will also have a follow-up appointment with Brittany Strader PA-C on Wednesday Feb 23, 2024 at 3:00 PM. Arrive 15 minutes prior to appointment to check in. Contact information: [address] [place]  [address] [phone]        [facility] Follow up.   Why: Agency will call to set up first home visit.        Alphonsa Glendia LABOR, MD Follow up in 1 week(s).  Specialty: Family Medicine Contact information: [address] [place] [address] [phone]              Contact information for after-discharge care     Destination     [facility] .   Service: Skilled Nursing Contact information: [address] [place]  [address] [phone]                    Allergies  Allergen Reactions   Flecainide  Other (See Comments)    Rate related aberrancy versus slow VT, asymptomatic    Consultations: Cardiology, palliative care   Procedures/Studies: DG CHEST PORT 1 VIEW Result Date: 01/24/2024 CLINICAL DATA:  Shortness of breath EXAM: PORTABLE CHEST 1 VIEW COMPARISON:  01/23/2024 FINDINGS: Cardiac shadow is stable. Increasing bilateral pleural effusions are noted. Increasing central vascular congestion with interstitial edema is noted as well. No bony abnormality is noted. IMPRESSION: Worsening CHF with bilateral effusions. Electronically Signed   By: Oneil Devonshire M.D.   On: 01/24/2024 22:50   MR CARDIAC MORPHOLOGY W WO CONTRAST Result Date: 01/24/2024 CLINICAL DATA:  Ventricular tachycardia (VT), sustained EXAM: MR CARDIA MORPHOLOGY WITHOUT AND WITH CONTRAST; MR CARDIAC VELOCITY FLOW MAPPING TECHNIQUE: The patient was scanned on a 1.5 Tesla Siemens magnet. A dedicated cardiac coil was used. Functional imaging was done using TrueFisp sequences. 2,3, and 4 chamber views were done to assess for RWMA's. Modified Simpson's  rule using a short axis stack was used to calculate an ejection fraction on a dedicated work [employer]. The patient received 5mL GADAVIST  GADOBUTROL  1 MMOL/ML IV SOLN. After 10 minutes inversion recovery sequences were used to assess for infiltration and scar tissue. Phase contrast velocity encoded images obtained x 2. This examination is tailored for evaluation cardiac anatomy and function and provides very limited assessment of noncardiac structures, which are accordingly not evaluated during interpretation. If there is clinical concern for extracardiac pathology, further evaluation with CT imaging should be considered. FINDINGS: LEFT VENTRICLE: Normal left ventricular size. Maximum septal wall thickness: 0.7 cm. Posterior wall thickness 0.7 cm. Left ventricular internal diameter (diastole): 4.8 cm. There are no regional wall motion abnormalities. LV EF: 59% (Normal 52-79%) Absolute volumes: LV EDV: 93 mL (Normal 78-167 mL) LV ESV: 38 mL (Normal 21-64 mL) LV SV: 55 mL (Normal 52-114 mL) CO: 5.4 L/min (Normal 2.7-6.3 L/min) Indexed volumes: LV EDV: 62 mL/sq-m (Normal 50-96 mL/sq-m) LV ESV: 25 mL/sq-m (Normal 10-40 mL/sq-m) LV SV: 37 mL/sq-m (Normal 33-64 mL/sq-m) CI: 3.6 L/min/sq-m (Normal 1.9-3.9 L/min/sq-m) RIGHT VENTRICLE: Normal right ventricular chamber size. Normal right ventricular wall thickness. Normal right ventricular systolic function. There are no regional wall motion abnormalities. RV EF: 54% (Normal 52-80%) Absolute volumes: RV EDV: 95 mL (Normal 79-175 mL) RV ESV: 44 mL (Normal 13-75 mL) RV SV: 52 mL (Normal 56-110 mL) CO: 5.1 L/min (Normal 2.7-6 L/min) Indexed volumes: RV EDV: 64 mL/sq-m (Normal 51-97 mL/sq-m) RV ESV: 29 mL/sq-m (Normal 9-42 mL/sq-m) RV SV: 35 mL/sq-m (Normal 35-61 mL/sq-m) CI: 3.4 L/min/sq-m (Normal 1.8-3.8 L/min/sq-m) Left atrium: Normal size. Right atrium: Normal size. Mitral valve: Normal mitral valve without regurgitation. Aortic valve: Grossly normal  without regurgitation. Tricuspid valve: Normal tricuspid valve with mild regurgitation. Pulmonic valve: Normal pulmonary valve without regurgitation. Aorta: The aortic root and proximal ascending aorta are normal in diameter. Pericardium: The pericardium is normal thickness. Trivial pericardial effusion. There is no evidence of intracardiac thrombus. Systemic flow across the aortic valve (QS) was 4.5  L/min. Pulmonary flow across the pulmonic valve (QP) was 5.1  L/min. The QP:QS calculated across the aortic and pulmonic valves was 1.1. Native myocardial T1-relaxation times were normal at 1010 ms. Delayed Enhancement: No delayed enhancement. No evidence of infarction, inflammation, or infiltrative disorder. Extracardiac structures: Mild bilateral pleural effusions noted. IMPRESSION: 1. The left ventricle is normal in cavity size and wall thickness. Systolic function was normal with LVEF 59%. No regional wall motion abnormalities. 2. The right ventricle is normal in cavity size and systolic function. 3. No significant valvular heart disease. 4. Delayed enhancement imaging demonstrates no evidence of myocardial infarction, inflammation, or infiltrative disorder. 5. Mild bilateral pleural effusions noted. Darryle Decent, MD Electronically Signed   By: Darryle Decent M.D.   On: 01/24/2024 14:56   MR CARDIAC VELOCITY FLOW MAP Result Date: 01/24/2024 CLINICAL DATA:  Ventricular tachycardia (VT), sustained EXAM: MR CARDIA MORPHOLOGY WITHOUT AND WITH CONTRAST; MR CARDIAC VELOCITY FLOW MAPPING TECHNIQUE: The patient was scanned on a 1.5 Tesla Siemens magnet. A dedicated cardiac coil was used. Functional imaging was done using TrueFisp sequences. 2,3, and 4 chamber views were done to assess for RWMA's. Modified Simpson's rule using a short axis stack was used to calculate an ejection fraction on a dedicated work [employer]. The patient received 5mL GADAVIST  GADOBUTROL  1 MMOL/ML IV SOLN. After 10 minutes  inversion recovery sequences were used to assess for infiltration and scar tissue. Phase contrast velocity encoded images obtained x 2. This examination is tailored for evaluation cardiac anatomy and function and provides very limited assessment of noncardiac structures, which are accordingly not evaluated during interpretation. If there is clinical concern for extracardiac pathology, further evaluation with CT imaging should be considered. FINDINGS: LEFT VENTRICLE: Normal left ventricular size. Maximum septal wall thickness: 0.7 cm. Posterior wall thickness 0.7 cm. Left ventricular internal diameter (diastole): 4.8 cm. There are no regional wall motion abnormalities. LV EF: 59% (Normal 52-79%) Absolute volumes: LV EDV: 93 mL (Normal 78-167 mL) LV ESV: 38 mL (Normal 21-64 mL) LV SV: 55 mL (Normal 52-114 mL) CO: 5.4 L/min (Normal 2.7-6.3 L/min) Indexed volumes: LV EDV: 62 mL/sq-m (Normal 50-96 mL/sq-m) LV ESV: 25 mL/sq-m (Normal 10-40 mL/sq-m) LV SV: 37 mL/sq-m (Normal 33-64 mL/sq-m) CI: 3.6 L/min/sq-m (Normal 1.9-3.9 L/min/sq-m) RIGHT VENTRICLE: Normal right ventricular chamber size. Normal right ventricular wall thickness. Normal right ventricular systolic function. There are no regional wall motion abnormalities. RV EF: 54% (Normal 52-80%) Absolute volumes: RV EDV: 95 mL (Normal 79-175 mL) RV ESV: 44 mL (Normal 13-75 mL) RV SV: 52 mL (Normal 56-110 mL) CO: 5.1 L/min (Normal 2.7-6 L/min) Indexed volumes: RV EDV: 64 mL/sq-m (Normal 51-97 mL/sq-m) RV ESV: 29 mL/sq-m (Normal 9-42 mL/sq-m) RV SV: 35 mL/sq-m (Normal 35-61 mL/sq-m) CI: 3.4 L/min/sq-m (Normal 1.8-3.8 L/min/sq-m) Left atrium: Normal size. Right atrium: Normal size. Mitral valve: Normal mitral valve without regurgitation. Aortic valve: Grossly normal without regurgitation. Tricuspid valve: Normal tricuspid valve with mild regurgitation. Pulmonic valve: Normal pulmonary valve without regurgitation. Aorta: The aortic root and proximal ascending aorta are  normal in diameter. Pericardium: The pericardium is normal thickness. Trivial pericardial effusion. There is no evidence of intracardiac thrombus. Systemic flow across the aortic valve (QS) was 4.5 L/min. Pulmonary flow across the pulmonic valve (QP) was 5.1  L/min. The QP:QS calculated across the aortic and pulmonic valves was 1.1. Native myocardial T1-relaxation times were normal at 1010 ms. Delayed Enhancement: No delayed enhancement. No evidence of infarction, inflammation, or infiltrative disorder. Extracardiac structures: Mild bilateral  pleural effusions noted. IMPRESSION: 1. The left ventricle is normal in cavity size and wall thickness. Systolic function was normal with LVEF 59%. No regional wall motion abnormalities. 2. The right ventricle is normal in cavity size and systolic function. 3. No significant valvular heart disease. 4. Delayed enhancement imaging demonstrates no evidence of myocardial infarction, inflammation, or infiltrative disorder. 5. Mild bilateral pleural effusions noted. Darryle Decent, MD Electronically Signed   By: Darryle Decent M.D.   On: 01/24/2024 14:56   MR CARDIAC VELOCITY FLOW MAP Result Date: 01/24/2024 CLINICAL DATA:  Ventricular tachycardia (VT), sustained EXAM: MR CARDIA MORPHOLOGY WITHOUT AND WITH CONTRAST; MR CARDIAC VELOCITY FLOW MAPPING TECHNIQUE: The patient was scanned on a 1.5 Tesla Siemens magnet. A dedicated cardiac coil was used. Functional imaging was done using TrueFisp sequences. 2,3, and 4 chamber views were done to assess for RWMA's. Modified Simpson's rule using a short axis stack was used to calculate an ejection fraction on a dedicated work [employer]. The patient received 5mL GADAVIST  GADOBUTROL  1 MMOL/ML IV SOLN. After 10 minutes inversion recovery sequences were used to assess for infiltration and scar tissue. Phase contrast velocity encoded images obtained x 2. This examination is tailored for evaluation cardiac anatomy and function  and provides very limited assessment of noncardiac structures, which are accordingly not evaluated during interpretation. If there is clinical concern for extracardiac pathology, further evaluation with CT imaging should be considered. FINDINGS: LEFT VENTRICLE: Normal left ventricular size. Maximum septal wall thickness: 0.7 cm. Posterior wall thickness 0.7 cm. Left ventricular internal diameter (diastole): 4.8 cm. There are no regional wall motion abnormalities. LV EF: 59% (Normal 52-79%) Absolute volumes: LV EDV: 93 mL (Normal 78-167 mL) LV ESV: 38 mL (Normal 21-64 mL) LV SV: 55 mL (Normal 52-114 mL) CO: 5.4 L/min (Normal 2.7-6.3 L/min) Indexed volumes: LV EDV: 62 mL/sq-m (Normal 50-96 mL/sq-m) LV ESV: 25 mL/sq-m (Normal 10-40 mL/sq-m) LV SV: 37 mL/sq-m (Normal 33-64 mL/sq-m) CI: 3.6 L/min/sq-m (Normal 1.9-3.9 L/min/sq-m) RIGHT VENTRICLE: Normal right ventricular chamber size. Normal right ventricular wall thickness. Normal right ventricular systolic function. There are no regional wall motion abnormalities. RV EF: 54% (Normal 52-80%) Absolute volumes: RV EDV: 95 mL (Normal 79-175 mL) RV ESV: 44 mL (Normal 13-75 mL) RV SV: 52 mL (Normal 56-110 mL) CO: 5.1 L/min (Normal 2.7-6 L/min) Indexed volumes: RV EDV: 64 mL/sq-m (Normal 51-97 mL/sq-m) RV ESV: 29 mL/sq-m (Normal 9-42 mL/sq-m) RV SV: 35 mL/sq-m (Normal 35-61 mL/sq-m) CI: 3.4 L/min/sq-m (Normal 1.8-3.8 L/min/sq-m) Left atrium: Normal size. Right atrium: Normal size. Mitral valve: Normal mitral valve without regurgitation. Aortic valve: Grossly normal without regurgitation. Tricuspid valve: Normal tricuspid valve with mild regurgitation. Pulmonic valve: Normal pulmonary valve without regurgitation. Aorta: The aortic root and proximal ascending aorta are normal in diameter. Pericardium: The pericardium is normal thickness. Trivial pericardial effusion. There is no evidence of intracardiac thrombus. Systemic flow across the aortic valve (QS) was 4.5 L/min.  Pulmonary flow across the pulmonic valve (QP) was 5.1  L/min. The QP:QS calculated across the aortic and pulmonic valves was 1.1. Native myocardial T1-relaxation times were normal at 1010 ms. Delayed Enhancement: No delayed enhancement. No evidence of infarction, inflammation, or infiltrative disorder. Extracardiac structures: Mild bilateral pleural effusions noted. IMPRESSION: 1. The left ventricle is normal in cavity size and wall thickness. Systolic function was normal with LVEF 59%. No regional wall motion abnormalities. 2. The right ventricle is normal in cavity size and systolic function. 3. No significant valvular heart disease. 4. Delayed enhancement  imaging demonstrates no evidence of myocardial infarction, inflammation, or infiltrative disorder. 5. Mild bilateral pleural effusions noted. Darryle Decent, MD Electronically Signed   By: Darryle Decent M.D.   On: 01/24/2024 14:56   DG CHEST PORT 1 VIEW Result Date: 01/23/2024 CLINICAL DATA:  Aspiration pneumonia EXAM: PORTABLE CHEST 1 VIEW COMPARISON:  01/15/2024 FINDINGS: Atherosclerotic calcification of the aortic arch. Mild thoracic spondylosis. Accentuated markings and interstitial opacity at the lung bases and perihilar regions. Mild central airway thickening. Both increased from prior. Upper normal heart size. No blunting of the lateral costophrenic angles. IMPRESSION: 1. Accentuated markings and interstitial opacity at the lung bases and perihilar regions, increased from prior. This could be from atypical pneumonia, aspiration pneumonitis, or mild interstitial edema. 2. Upper normal heart size. 3. Aortic Atherosclerosis (ICD10-I70.0). Electronically Signed   By: Ryan Salvage M.D.   On: 01/23/2024 12:14   CARDIAC CATHETERIZATION Result Date: 01/22/2024 Table formatting from the original result was not included. Images from the original result were not included. Dominance: Right -> Angiographically Normal Coronary Arteries.  Normal LVEDP.   Nonischemic etiology for VT RECOMMENDATIONS   Anticipated discharge date to be determined.   Recommend to resume Apixaban , at currently prescribed dose and frequency on 01/22/2024.   Could reinitiate Apixaban  as early as a.m. of 01/22/2024 versus placed back on IV heparin  pending further evaluation. Alm Clay, MD   CT PELVIS WO CONTRAST Result Date: 01/21/2024 EXAM: CT Pelvis, Without IV Contrast 01/21/2024 11:09:00 AM TECHNIQUE: Axial images were acquired through the pelvis without IV contrast. Reformatted images were reviewed. Automated exposure control, iterative reconstruction, and/or weight based adjustment of the mA/kV was utilized to reduce the radiation dose to as low as reasonably achievable. COMPARISON: None available. CLINICAL HISTORY: Fall concern for pelvic fracture. FINDINGS: BONES: No acute fracture is present. Mild degenerative changes are present at the SI joints bilaterally. Facet degenerative changes are present at L4-5 and L5-S1. JOINTS: The hips are located. Mild degenerative changes are present bilaterally. SOFT TISSUES: A 4 cm right adnexal cyst is present. INTRAPELVIC CONTENTS: Atherosclerotic changes are present in the distal aorta and proximal iliac vessels without aneurysm. IMPRESSION: 1. No acute pelvic fracture. 2. 4 cm right adnexal cyst. This is a probable benign cyst. Recommend follow-up ultrasound in 3 to 6 months Electronically signed by: Lonni Necessary MD 01/21/2024 11:55 AM EDT RP Workstation: [id number]   ECHOCARDIOGRAM COMPLETE Result Date: 01/19/2024    ECHOCARDIOGRAM REPORT   Patient Name:   [name] Date of Exam: 01/19/2024 Medical Rec #:  [id number]     Height:       62.0 in Accession #:    [id number]    Weight:       105.2 lb Date of Birth:  Sep 25, 1940    BSA:          1.455 m Patient Age:    82 years      BP:           140/57 mmHg Patient Gender: F             HR:           80 bpm. Exam Location:  [name] Procedure: 2D Echo, Cardiac Doppler and Color  Doppler (Both Spectral and Color            Flow Doppler were utilized during procedure). Indications:    Ventricular Tachycardia l47.2  History:        Patient has prior history of Echocardiogram examinations, most  recent 09/24/2022. Arrythmias:Atrial Fibrillation; Risk                 Factors:Dyslipidemia, Former Smoker and Pre-diabetes. Hx of                 COVID-19.  Sonographer:    Aida Pizza RCS Referring Phys: [id number] [name] IMPRESSIONS  1. Left ventricular ejection fraction, by estimation, is 60 to 65%. The left ventricle has normal function. The left ventricle has no regional wall motion abnormalities. Left ventricular diastolic parameters were normal.  2. Right ventricular systolic function is normal. The right ventricular size is normal. There is normal pulmonary artery systolic pressure. The estimated right ventricular systolic pressure is 33.2 mmHg.  3. A small pericardial effusion is present. The pericardial effusion is posterior to the left ventricle.  4. The mitral valve is grossly normal. Mild mitral valve regurgitation.  5. Tricuspid valve regurgitation is moderate.  6. The aortic valve has an indeterminant number of cusps, cannot exclude functionally bicuspid valve. Aortic valve regurgitation is not visualized. No aortic stenosis is present. Aortic valve mean gradient measures 7.0 mmHg. Calification noted within LVOT and annulus.  7. The inferior vena cava is normal in size with greater than 50% respiratory variability, suggesting right atrial pressure of 3 mmHg. Comparison(s): Prior images reviewed side by side. LVEF 60-65%. Possible functionally bicuspid aortic valve without stenosis. FINDINGS  Left Ventricle: Left ventricular ejection fraction, by estimation, is 60 to 65%. The left ventricle has normal function. The left ventricle has no regional wall motion abnormalities. The left ventricular internal cavity size was normal in size. There is  borderline left  ventricular hypertrophy. Left ventricular diastolic parameters were normal. Right Ventricle: The right ventricular size is normal. No increase in right ventricular wall thickness. Right ventricular systolic function is normal. There is normal pulmonary artery systolic pressure. The tricuspid regurgitant velocity is 2.75 m/s, and  with an assumed right atrial pressure of 3 mmHg, the estimated right ventricular systolic pressure is 33.2 mmHg. Left Atrium: Left atrial size was normal in size. Right Atrium: Right atrial size was normal in size. Pericardium: A small pericardial effusion is present. The pericardial effusion is posterior to the left ventricle. Mitral Valve: The mitral valve is grossly normal. Mild mitral valve regurgitation. Tricuspid Valve: The tricuspid valve is grossly normal. Tricuspid valve regurgitation is moderate. Aortic Valve: The aortic valve has an indeterminant number of cusps. Aortic valve regurgitation is not visualized. No aortic stenosis is present. Aortic valve mean gradient measures 7.0 mmHg. Aortic valve peak gradient measures 14.3 mmHg. Aortic valve area, by VTI measures 1.20 cm. Pulmonic Valve: The pulmonic valve was grossly normal. Pulmonic valve regurgitation is trivial. Aorta: The aortic root is normal in size and structure. Venous: The inferior vena cava is normal in size with greater than 50% respiratory variability, suggesting right atrial pressure of 3 mmHg. IAS/Shunts: No atrial level shunt detected by color flow Doppler. Additional Comments: 3D was performed not requiring image post processing on an independent workstation and was indeterminate.  LEFT VENTRICLE PLAX 2D LVIDd:         4.20 cm   Diastology LVIDs:         2.40 cm   LV e' medial:    12.10 cm/s LV PW:         1.00 cm   LV E/e' medial:  9.0 LV IVS:        0.90 cm   LV e' lateral:   14.20 cm/s  LVOT diam:     1.60 cm   LV E/e' lateral: 7.7 LV SV:         53 LV SV Index:   37 LVOT Area:     2.01 cm  RIGHT VENTRICLE  RV S prime:     14.00 cm/s TAPSE (M-mode): 2.8 cm LEFT ATRIUM             Index        RIGHT ATRIUM           Index LA diam:        2.90 cm 1.99 cm/m   RA Area:     12.60 cm LA Vol (A2C):   20.8 ml 14.30 ml/m  RA Volume:   25.70 ml  17.67 ml/m LA Vol (A4C):   32.2 ml 22.14 ml/m LA Biplane Vol: 26.1 ml 17.94 ml/m  AORTIC VALVE AV Area (Vmax):    1.16 cm AV Area (Vmean):   1.20 cm AV Area (VTI):     1.20 cm AV Vmax:           189.00 cm/s AV Vmean:          125.000 cm/s AV VTI:            0.444 m AV Peak Grad:      14.3 mmHg AV Mean Grad:      7.0 mmHg LVOT Vmax:         109.00 cm/s LVOT Vmean:        74.900 cm/s LVOT VTI:          0.265 m LVOT/AV VTI ratio: 0.60  AORTA Ao Root diam: 2.80 cm MITRAL VALVE                TRICUSPID VALVE MV Area (PHT): 3.37 cm     TR Peak grad:   30.2 mmHg MV Decel Time: 225 msec     TR Vmax:        275.00 cm/s MV E velocity: 109.00 cm/s MV A velocity: 88.00 cm/s   SHUNTS MV E/A ratio:  1.24         Systemic VTI:  0.26 m                             Systemic Diam: 1.60 cm Jayson Sierras MD Electronically signed by Jayson Sierras MD Signature Date/Time: 01/19/2024/3:00:38 PM    Final    DG HIP UNILAT WITH PELVIS 2-3 VIEWS LEFT Result Date: 01/18/2024 CLINICAL DATA:  Fall, hip pain EXAM: DG HIP (WITH OR WITHOUT PELVIS) 2-3V LEFT COMPARISON:  06/03/2021 FINDINGS: Bony demineralization. Mild spurring of the left femoral head and acetabulum. No definite cortical discontinuity to indicate fracture of the left proximal femur or adjacent bony pelvis. IMPRESSION: 1. No fracture identified. If the patient is unable to bear weight or if there is a high clinical index of suspicion of occult fracture, cross-sectional imaging may be warranted for further characterization. 2. Mild degenerative spurring of the left femoral head and acetabulum. 3. Bony demineralization. Electronically Signed   By: Ryan Salvage M.D.   On: 01/18/2024 18:06   DG Knee Complete 4 Views Left Result Date:  01/15/2024 CLINICAL DATA:  Left knee pain after fall today. EXAM: LEFT KNEE - COMPLETE 4+ VIEW COMPARISON:  April 27, 2023. FINDINGS: No evidence of fracture, dislocation, or joint effusion. No evidence of arthropathy or other focal bone abnormality. Soft tissues are unremarkable. IMPRESSION: Negative. Electronically  Signed   By: Lynwood Landy Raddle M.D.   On: 01/15/2024 14:19   DG Chest 2 View Result Date: 01/15/2024 CLINICAL DATA:  Chest pain after fall today. EXAM: CHEST - 2 VIEW COMPARISON:  November 20, 2023. FINDINGS: The heart size and mediastinal contours are within normal limits. Both lungs are clear. The visualized skeletal structures are unremarkable. IMPRESSION: No active cardiopulmonary disease. Electronically Signed   By: Lynwood Landy Raddle M.D.   On: 01/15/2024 14:18   CT Maxillofacial Wo Contrast Result Date: 01/15/2024 CLINICAL DATA:  Status post fall. EXAM: CT MAXILLOFACIAL WITHOUT CONTRAST TECHNIQUE: Multidetector CT imaging of the maxillofacial structures was performed. Multiplanar CT image reconstructions were also generated. RADIATION DOSE REDUCTION: This exam was performed according to the departmental dose-optimization program which includes automated exposure control, adjustment of the mA and/or kV according to patient size and/or use of iterative reconstruction technique. COMPARISON:  None Available. FINDINGS: Osseous: No fracture or mandibular dislocation. No destructive process. Orbits: Negative. No traumatic or inflammatory finding. Sinuses: Clear. Soft tissues: There is marked severity left-sided facial soft tissue swelling. An associated 1.6 cm x 2.7 cm left-sided facial hematoma is noted. Mild to moderate severity lateral left periorbital soft tissue swelling is present. Limited intracranial: No significant or unexpected finding. IMPRESSION: 1. Marked severity left-sided facial soft tissue swelling with an associated 1.6 cm x 2.7 cm left-sided facial hematoma. 2. Mild to moderate severity  lateral left periorbital soft tissue swelling. 3. No acute facial bone fracture. Electronically Signed   By: Suzen Dials M.D.   On: 01/15/2024 13:49   CT Cervical Spine Wo Contrast Result Date: 01/15/2024 CLINICAL DATA:  Status post fall. EXAM: CT CERVICAL SPINE WITHOUT CONTRAST TECHNIQUE: Multidetector CT imaging of the cervical spine was performed without intravenous contrast. Multiplanar CT image reconstructions were also generated. RADIATION DOSE REDUCTION: This exam was performed according to the departmental dose-optimization program which includes automated exposure control, adjustment of the mA and/or kV according to patient size and/or use of iterative reconstruction technique. COMPARISON:  April 27, 2023 FINDINGS: Alignment: There is approximately 2 mm stepwise anterolisthesis of the C4 vertebral body on C5 and C5 vertebral body on C6. Skull base and vertebrae: No acute fracture. No primary bone lesion or focal pathologic process. Soft tissues and spinal canal: No prevertebral fluid or swelling. No visible canal hematoma. Disc levels: Mild multilevel endplate sclerosis is seen throughout the cervical spine with very mild anterior osteophyte formation and posterior bony spurring noted at the levels of C4-C5 and C5-C6. There is mild multilevel intervertebral disc space narrowing throughout the cervical spine. Marked severity bilateral cavernous carotid artery calcification is noted. Upper chest: There is moderate to marked severity biapical scarring and/or atelectasis. Other: None. IMPRESSION: 1. No acute cervical spine fracture. 2. Multilevel degenerative changes, as described above. 3. Moderate to marked severity biapical scarring and/or atelectasis. Electronically Signed   By: Suzen Dials M.D.   On: 01/15/2024 13:44   CT Head Wo Contrast Result Date: 01/15/2024 CLINICAL DATA:  Status post fall. EXAM: CT HEAD WITHOUT CONTRAST TECHNIQUE: Contiguous axial images were obtained from the base  of the skull through the vertex without intravenous contrast. RADIATION DOSE REDUCTION: This exam was performed according to the departmental dose-optimization program which includes automated exposure control, adjustment of the mA and/or kV according to patient size and/or use of iterative reconstruction technique. COMPARISON:  None Available. FINDINGS: Brain: There is mild generalized cerebral atrophy with widening of the extra-axial spaces and ventricular dilatation. There are  areas of decreased attenuation within the white matter tracts of the supratentorial brain, consistent with microvascular disease changes. Vascular: No hyperdense vessel or unexpected calcification. Skull: Normal. Negative for fracture or focal lesion. Sinuses/Orbits: No acute finding. Other: There is marked severity left-sided facial soft tissue swelling. An associated 1.7 cm x 2.3 cm left-sided facial hematoma is seen. IMPRESSION: 1. Marked severity left-sided facial soft tissue swelling with an associated 1.7 cm x 2.3 cm left-sided facial hematoma. 2. No acute intracranial abnormality. Electronically Signed   By: Suzen Dials M.D.   On: 01/15/2024 13:39   DG Humerus Left Result Date: 01/15/2024 CLINICAL DATA:  Left arm pain after fall today. EXAM: LEFT HUMERUS - 2+ VIEW COMPARISON:  May 26, 2021. FINDINGS: There is no evidence of acute fracture or dislocation. Old healed fracture involving proximal left humeral head is noted. IMPRESSION: No acute abnormality seen. Electronically Signed   By: Lynwood Landy Raddle M.D.   On: 01/15/2024 13:26     Discharge Exam: Vitals:   01/27/24 0458 01/27/24 0805  BP: (!) 157/79   Pulse: 81   Resp: 16 18  Temp: 98.9 F (37.2 C) 98.2 F (36.8 C)  SpO2: 96%    Vitals:   01/26/24 2015 01/26/24 2325 01/27/24 0458 01/27/24 0805  BP:  (!) 143/55 (!) 157/79   Pulse: 76 69 81   Resp:  18 16 18   Temp:  97.8 F (36.6 C) 98.9 F (37.2 C) 98.2 F (36.8 C)  TempSrc:  Oral Oral   SpO2:  93% 96% 96%   Weight:      Height:        General: Pt is alert, awake, not in acute distress Cardiovascular: Irregularly irregular RR, S1/S2 +, no rubs, no gallops Respiratory: CTA bilaterally, no wheezing, no rhonchi Abdominal: Soft, NT, ND, bowel sounds + Extremities: no edema, no cyanosis    The results of significant diagnostics from this hospitalization (including imaging, microbiology, ancillary and laboratory) are listed below for reference.     Microbiology: No results found for this or any previous visit (from the past 240 hours).   Labs: BNP (last 3 results) Recent Labs    01/25/24 0108  BNP 1,072.2*   Basic Metabolic Panel: Recent Labs  Lab 01/21/24 0430 01/21/24 9347 01/22/24 0536 01/23/24 0531 01/24/24 0552 01/25/24 1102 01/26/24 0525 01/27/24 0442  NA 130*   < > 130* 130* 140 141 138  --   K 4.6   < > 4.8 3.7 3.6 3.0* 4.0  --   CL 94*   < > 95* 103 111 111 109  --   CO2 21*   < > 20* 17* 17* 19* 21*  --   GLUCOSE 113*   < > 93 121* 117* 174* 116*  --   BUN 29*   < > 33* 30* 22 19 15   --   CREATININE 1.66*   < > 1.61* 1.47* 1.23* 1.32* 1.29*  --   CALCIUM  8.1*   < > 7.9* 7.2* 7.5* 7.9* 7.5*  --   MG 1.9  --   --   --   --   --  1.1* 1.8  PHOS 2.9  --   --   --   --   --   --   --    < > = values in this interval not displayed.   Liver Function Tests: No results for input(s): AST, ALT, ALKPHOS, BILITOT, PROT, ALBUMIN in the last 168 hours. No results  for input(s): LIPASE, AMYLASE in the last 168 hours. No results for input(s): AMMONIA in the last 168 hours. CBC: Recent Labs  Lab 01/23/24 0531 01/24/24 0552 01/25/24 0108 01/26/24 0525 01/27/24 0442  WBC 18.2* 17.2* 20.2* 17.9* 20.7*  NEUTROABS  --   --   --  14.1*  --   HGB 8.0* 7.5* 7.9* 7.5* 8.2*  HCT 24.8* 22.9* 24.6* 22.8* 24.7*  MCV 101.6* 101.3* 101.7* 100.0 100.0  PLT 222 244 302 323 364   Cardiac Enzymes: No results for input(s): CKTOTAL, CKMB, CKMBINDEX,  TROPONINI in the last 168 hours. BNP: Invalid input(s): POCBNP CBG: No results for input(s): GLUCAP in the last 168 hours. D-Dimer No results for input(s): DDIMER in the last 72 hours. Hgb A1c No results for input(s): HGBA1C in the last 72 hours. Lipid Profile No results for input(s): CHOL, HDL, LDLCALC, TRIG, CHOLHDL, LDLDIRECT in the last 72 hours. Thyroid  function studies No results for input(s): TSH, T4TOTAL, T3FREE, THYROIDAB in the last 72 hours.  Invalid input(s): FREET3 Anemia work up No results for input(s): VITAMINB12, FOLATE, FERRITIN, TIBC, IRON, RETICCTPCT in the last 72 hours. Urinalysis    Component Value Date/Time   COLORURINE YELLOW 04/18/2020 1610   APPEARANCEUR TURBID (A) 04/18/2020 1610   LABSPEC 1.032 (H) 04/18/2020 1610   PHURINE 5.0 04/18/2020 1610   GLUCOSEU NEGATIVE 04/18/2020 1610   HGBUR NEGATIVE 04/18/2020 1610   BILIRUBINUR NEGATIVE 04/18/2020 1610   KETONESUR 5 (A) 04/18/2020 1610   PROTEINUR 30 (A) 04/18/2020 1610   UROBILINOGEN 0.2 11/08/2007 2123   NITRITE NEGATIVE 04/18/2020 1610   LEUKOCYTESUR SMALL (A) 04/18/2020 1610   Sepsis Labs Recent Labs  Lab 01/24/24 0552 01/25/24 0108 01/26/24 0525 01/27/24 0442  WBC 17.2* 20.2* 17.9* 20.7*   Microbiology No results found for this or any previous visit (from the past 240 hours).  FURTHER DISCHARGE INSTRUCTIONS:   Get Medicines reviewed and adjusted: Please take all your medications with you for your next visit with your Primary MD   Laboratory/radiological data: Please request your Primary MD to go over all hospital tests and procedure/radiological results at the follow up, please ask your Primary MD to get all Hospital records sent to his/her office.   In some cases, they will be blood work, cultures and biopsy results pending at the time of your discharge. Please request that your primary care M.D. goes through all the records of your  hospital data and follows up on these results.   Also Note the following: If you experience worsening of your admission symptoms, develop shortness of breath, life threatening emergency, suicidal or homicidal thoughts you must seek medical attention immediately by calling 911 or calling your MD immediately  if symptoms less severe.   You must read complete instructions/literature along with all the possible adverse reactions/side effects for all the Medicines you take and that have been prescribed to you. Take any new Medicines after you have completely understood and accpet all the possible adverse reactions/side effects.    patient was instructed, not to drive, operate heavy machinery, perform activities at heights, swimming or participation in water  activities or provide baby-sitting services while on Pain, Sleep and Anxiety Medications; until their outpatient Physician has advised to do so again. Also recommended to not to take more than prescribed Pain, Sleep and Anxiety Medications.  It is not advisable to combine anxiety, sleep and pain medications without talking with your primary care provider.     Wear Seat belts while driving.  Please note: You were cared for by a hospitalist during your hospital stay. Once you are discharged, your primary care physician will handle any further medical issues. Please note that NO REFILLS for any discharge medications will be authorized once you are discharged, as it is imperative that you return to your primary care physician (or establish a relationship with a primary care physician if you do not have one) for your post hospital discharge needs so that they can reassess your need for medications and monitor your lab values  Time coordinating discharge: Over 30 minutes  SIGNED:   Fredia Skeeter, MD  [facility] 01/27/2024, 10:31 AM *Please note that this is a verbal dictation therefore any spelling or grammatical errors are due to the Dragon  [employer] One system interpretation. If 7PM-7AM, please contact night-coverage [email or web address]

## 2024-01-27 NOTE — Care Management Important Message (Signed)
 Important Message  Patient Details  Name: [name] MRN: [id number] Date of Birth: [birth year]   Important Message Given:  Yes - Medicare IM     Vonzell Arrie Sharps 01/27/2024, 10:17 AM

## 2024-02-01 NOTE — Nursing Note (Signed)
 Adult Nutrition Initial Assessment   Assessment 83 y.o. female presents with Principal Problem:   Orthostasis Active Problems:   Repeated falls   Acute on chronic diastolic heart failure    (CMS-HCC)   Recent head trauma, subsequent encounter   Muscle weakness (generalized)   Age-related physical debility   S/P cardiac cath   Paroxysmal atrial fibrillation    (CMS-HCC)   Long term (current) use of anticoagulants   Osteoporosis   Hyponatremia   Leukocytosis   Hyperlipidemia   Uncomplicated opioid dependence    (CMS-HCC)   Chronic bilateral low back pain with sciatica   Acute kidney failure, unspecified   Stage 3a chronic kidney disease (CMS-HCC)   COPD (chronic obstructive pulmonary disease)    (CMS-HCC)   Gastroesophageal reflux disease   Osteoarthritis   Gastroparesis   Cognitive communication deficit  PO Intake: 75% of meals consumed at this time Swallowing: No issues noted at this time  [name] admitted with Orthostasis. Noted that intake and appetite has been good to fair since admission. No significant weight changes noted at this time and current regular diet is appropriate to continue due to no additional issues at this time. Will continue to monitor intake and make recommendations for nutrition supplements if resident does not meet estimated needs through PO intake during admission. Will continue to follow-up as needed to address nutrition concerns.  Medical History  Past Medical History[1] Surgical History  Past Surgical History[2]  Nutrition Orders         Nutrition Therapy Regular/House starting at 09/18 1343      Ht: 157.5 cm (5' 2) Wt: 51.5 kg (113 lb 9.6 oz) IBW: 49.97 kg Percentage Ideal Body Weight: 103.12 % BMI: 20.77  BMI Classification: Normal weight (BMI 18.50 - 24.99 kg/m2)  Daily Estimated Nutrient Needs:  Energy: 1300-1560kcal (25-30kcal/kg) Protein: 52-63g (1-1.2g/kg) Fluid: 66ml/kcal  Weight History   Last 30 Recorded Weights    01/27/24 1343 01/27/24 1450 01/29/24 0545 01/30/24 0600  Weight: 51.5 kg (113 lb 9.6 oz) 51.5 kg (113 lb 9.6 oz) 51.2 kg (112 lb 14.4 oz) 51.3 kg (113 lb 3.2 oz)   01/31/24 0527 02/01/24 0600  Weight: 51.5 kg (113 lb 9.6 oz) 51.5 kg (113 lb 9.6 oz)   Wt Readings from Last 12 Encounters:  02/01/24 51.5 kg (113 lb 9.6 oz)  07/21/18 46 kg (101 lb 6.4 oz)  03/29/18 48.5 kg (107 lb)  01/04/18 49.2 kg (108 lb 6.4 oz)  09/30/17 49.7 kg (109 lb 9.6 oz)  07/06/17 49.3 kg (108 lb 9.6 oz)  03/24/17 49.4 kg (109 lb)  01/05/17 52.1 kg (114 lb 12.8 oz)  08/07/13 48.5 kg (106 lb 15.8 oz)  05/16/13 49.9 kg (109 lb 15.8 oz)   Current Medications  Scheduled Meds:Scheduled Medications[3] Continuous Infusions:Infusions Meds[4]  No results found for: WBC, HGB, HCT, PLT  No results found for: NA, K, CL, CO2, BUN, CREATININE, GLU, CALCIUM , MG, PHOS  No results found for: BILITOT, BILIDIR, PROT, ALBUMIN, ALT, AST, ALKPHOS, GGT  No results found for: PT, INR, APTT  Per Flowsheet Documentation: Peripheral Vascular: WDL.  Abd/GI: WDL, Last BM: N/A Wounds:  Patient Lines/Drains/Airways Status     Active Wounds     Name Placement date Placement time Site Days   Wound 01/27/24 Traumatic Abrasion Left Hematoma 4 cm x 3 cm protruding 3 cm, abrasion on hematoma 2 cm x 1 cm 01/27/24  1450  --  4  Nutrition Diagnosis Inadequate energy intake RT inconsistent meal intake prior to admission AEB elderly BMI of 20.78.  Intervention Continue current medical nutrition therapy as ordered Encourage PO intake at meal times and between meals  Nutrition Goals: Resident to consistently consume greater than 75% of daily meals through next review.  Monitoring & Evaluation Monitor effectiveness of current medical nutrition therapy order(s). Monitor for medical nutrition therapy tolerance.  Thomas  Vagts 02/01/2024       [1] Past Medical  History: Diagnosis Date  . Atrial fibrillation    (CMS-HCC)   . Colon adenocarcinoma    (CMS-HCC)   . GERD (gastroesophageal reflux disease)   . Hyperlipemia   [2] Past Surgical History: Procedure Laterality Date  . CHOLECYSTECTOMY    . COLON SURGERY    . FORAMINAL DISCECTOMY  2015  . GALLBLADDER SURGERY    . HYSTERECTOMY    [3] . alendronate   70 mg Oral Weekly  . amiodarone   200 mg Oral BID  . [START ON 02/08/2024] amiodarone   200 mg Oral Daily  . apixaban   2.5 mg Oral BID  . atorvastatin  80 mg Oral Nightly (2000)  . calcium  carbonate-vitamin D3  1 tablet Oral Daily  . cyanocobalamin  (vitamin B-12)  1,000 mcg Oral Daily  . ferrous sulfate  325 mg Oral Daily  . furosemide   20 mg Oral Daily  . lactulose   20 g Oral Nightly (2000)  . midodrine   5 mg Oral 3xd Meals  . omeprazole  20 mg Oral Daily before breakfast  . [START ON 02/10/2024] tuberculin PPD  5 Units Intradermal Once  [4]

## 2024-02-01 NOTE — Care Plan (Signed)
 Mrs. Callens's intake and appetite has been good to fair since admission. No significant weight changes noted at this time and current regular diet is appropriate to continue due to no additional issues at this time. Will continue to monitor intake and make recommendations for nutrition supplements if resident does not meet estimated needs through PO intake during admission. Will continue to follow-up as needed to address nutrition concerns.

## 2024-02-01 NOTE — Nursing Note (Signed)
 Alert and verbal. Respirations even and non labored. No sob or cough noted. No pain noted. Continues with therapy services.

## 2024-02-01 NOTE — Nursing Note (Signed)
 Patient is alert and oriented. Able to make needs known to staff. Complaints of pain. PRN Norco given. Currently in bed with call bell in reach.

## 2024-02-02 ENCOUNTER — Encounter: Payer: Self-pay | Admitting: Cardiology

## 2024-02-02 ENCOUNTER — Telehealth: Payer: Self-pay | Admitting: Cardiology

## 2024-02-02 MED ORDER — MIDODRINE HCL 5 MG PO TABS: 7.5000 mg | ORAL_TABLET | Freq: Three times a day (TID) | ORAL | 3 refills | Status: DC

## 2024-02-02 NOTE — Telephone Encounter (Signed)
 Spoke to patients daughter in law who stated that pt is in a skilled nursing facility and called her c/o low blood pressure. Pt's DIL stated that last night pt called and said nursing staff took her blood pressure and it was 137/37. Pt told DIL that diastolic number has been in the low to mid 40's. Pt also c/o of light headedness and dizziness.  Called [facility] to have bp readings faxed to our office.   Please advise.

## 2024-02-02 NOTE — Telephone Encounter (Signed)
 Can increase midodrine  to 7.5mg  tid  JINNY Ross MD

## 2024-02-02 NOTE — Telephone Encounter (Signed)
 Spoke to [place] at [place] who verbalized understanding. Orders faxed to rehab facility.

## 2024-02-02 NOTE — Telephone Encounter (Signed)
 Pt c/o BP issue: STAT if pt c/o blurred vision, one-sided weakness or slurred speech.  STAT if BP is GREATER than 180/120 TODAY.  STAT if BP is LESS than 90/60 and SYMPTOMATIC TODAY  1. What is your BP concern? Bottom number too low  2. Have you taken any BP medication today?yes  3. What are your last 5 BP readings?137/37  4. Are you having any other symptoms (ex. Dizziness, headache, blurred vision, passed out)? Lightheaded

## 2024-02-10 NOTE — Telephone Encounter (Signed)
 Daughter-in law is calling back saying pt's BP is still not regulated.Please advise

## 2024-02-10 NOTE — Telephone Encounter (Signed)
 Spoke with [name] at [facility] who states that she will fax BP log to office. Daughter-in-law states that pt's BP has been running low. She states that the diastolic has been between 70- 30. Please advise.

## 2024-02-11 ENCOUNTER — Encounter: Payer: Self-pay | Admitting: Cardiology

## 2024-02-11 NOTE — Telephone Encounter (Signed)
 Blood pressure log received and scanned and sent to Dr. Alvan for review.

## 2024-02-12 DIAGNOSIS — I5031 Acute diastolic (congestive) heart failure: Secondary | ICD-10-CM | POA: Diagnosis not present

## 2024-02-12 DIAGNOSIS — E871 Hypo-osmolality and hyponatremia: Secondary | ICD-10-CM | POA: Diagnosis not present

## 2024-02-12 DIAGNOSIS — I480 Paroxysmal atrial fibrillation: Secondary | ICD-10-CM | POA: Diagnosis not present

## 2024-02-12 DIAGNOSIS — R531 Weakness: Secondary | ICD-10-CM | POA: Diagnosis not present

## 2024-02-12 DIAGNOSIS — M7981 Nontraumatic hematoma of soft tissue: Secondary | ICD-10-CM | POA: Diagnosis not present

## 2024-02-12 DIAGNOSIS — K219 Gastro-esophageal reflux disease without esophagitis: Secondary | ICD-10-CM | POA: Diagnosis not present

## 2024-02-12 DIAGNOSIS — M5459 Other low back pain: Secondary | ICD-10-CM | POA: Diagnosis not present

## 2024-02-14 NOTE — Telephone Encounter (Signed)
 Spoke with daughter in Social worker. She states that the pt has been recording blood pressures and will bring them to her next visit.

## 2024-02-23 ENCOUNTER — Ambulatory Visit: Attending: Student | Admitting: Student

## 2024-02-23 ENCOUNTER — Encounter: Payer: Self-pay | Admitting: Student

## 2024-02-23 VITALS — BP 108/68 | HR 65 | Ht 62.0 in | Wt 101.8 lb

## 2024-02-23 DIAGNOSIS — Z87898 Personal history of other specified conditions: Secondary | ICD-10-CM

## 2024-02-23 DIAGNOSIS — I951 Orthostatic hypotension: Secondary | ICD-10-CM

## 2024-02-23 DIAGNOSIS — I480 Paroxysmal atrial fibrillation: Secondary | ICD-10-CM | POA: Diagnosis not present

## 2024-02-23 DIAGNOSIS — Z7901 Long term (current) use of anticoagulants: Secondary | ICD-10-CM

## 2024-02-23 MED ORDER — FUROSEMIDE 20 MG PO TABS: 20.0000 mg | ORAL_TABLET | Freq: Every day | ORAL | Status: DC | PRN

## 2024-02-23 MED ORDER — FUROSEMIDE 20 MG PO TABS: 20.0000 mg | ORAL_TABLET | Freq: Every day | ORAL | 0 refills | Status: DC | PRN

## 2024-02-23 MED ORDER — MIDODRINE HCL 5 MG PO TABS: 5.0000 mg | ORAL_TABLET | Freq: Three times a day (TID) | ORAL | 3 refills | Status: DC

## 2024-02-23 NOTE — Progress Notes (Signed)
 Cardiology Office Note    Date:  02/23/2024  ID:  Jurnei, Latini 09-Nov-1940, MRN [id number] Cardiologist: Alvan Carrier, MD Cardiology APP:  Johnson Laymon HERO, PA-C { : History of Present Illness:    [name] is a 83 y.o. female with past medical history of palpitations (monitor in 08/2018 showing PAC's, PVC's and atrial tachycardia), history of atypical chest pain (NST in 08/2017 without ischemia), paroxysmal atrial fibrillation (on Flecainide ), orthostatic hypotension, anemia and HLD who presents to the office today for hospital follow-up.  She was admitted to [facility] from 9/6 - 01/27/2024 for evaluation of orthostasis and wide-complex tachycardia. Telemetry showed ventricular tachycardia vs. atrial fibrillation with aberrancy as she converted back to normal sinus rhythm. Flecainide  was discontinued given her arrhythmias. Echocardiogram showed a preserved EF of 60 to 65% with no regional wall motion abnormalities and RV function was normal. She did have mild MR. Underwent cardiac catheterization on 01/21/2024 which showed angiographically normal coronary arteries. Cardiac MRI showed normal LVEF of 59% and no evidence of infiltrative disease. EP was consulted and her initial presentation was felt to be most consistent with atrial fibrillation with aberrant conduction as opposed to VT. It was still felt that Flecainide  was not ideal given her electrolyte abnormalities and frequent orthostasis. Therefore, was recommended to try low-dose Amiodarone . Was not felt to be a candidate for ablation given her frail status and QTc was too prolonged to be on Tikosyn  Was started on Amiodarone  200 mg twice daily for 2 weeks followed by 200 mg daily. At the time of discharge, she was continued on Eliquis  2.5 mg twice daily, Lasix  20 mg daily, Midodrine  5 mg 3 times daily and Crestor  40 mg daily.  Appears an outpatient monitor had been arranged and this showed predominantly normal sinus rhythm with  18 episodes of SVT with the longest lasting for 16 beats. Did have rare PAC's and PVC's but less than 1% burden.  By review of Care Everywhere, she was admitted to [facility] from 9/18 - 02/12/2024. Family members did reach out while she was there reporting episodes of hypotension and Midodrine  was increased to 7.5 mg 3 times daily.  In talking with the patient, her son and her daughter-in-law today, she reports she has now returned home from rehab but has caregivers present throughout the day. She reports still having episodic dizziness which is typically worse with positional changes. Working with PT several days a week and performs exercises independently as well. Has been taking Lasix  20 mg daily which was a new medication for her following her recent hospitalization. She has been keeping a blood pressure log and is concerned about some readings as SBP was in the 160's on 1 occasion but has otherwise been in the 110's to 130's on average. She does consume at least 30 ounces of water  during the night but only approximately 8 ounces of water  during the day with occasional other beverages. Says that her appetite was minimal while at rehab but is slowly improving. She denies any chest pain or palpitations. No specific orthopnea or PND. Lower extremity edema has resolved since her hospitalization. Today is her birthday!  Studies Reviewed:   EKG: EKG is not ordered today.  Cardiac Catheterization: 01/2024 Dominance: Right -> Angiographically Normal Coronary Arteries.  Normal LVEDP.   Nonischemic etiology for VT    RECOMMENDATIONS   Anticipated discharge date to be determined.   Recommend to resume Apixaban , at currently prescribed dose and frequency on 01/22/2024.  Could reinitiate Apixaban  as early as a.m. of 01/22/2024 versus placed back on IV heparin  pending further evaluation.  Echocardiogram: 01/2024 IMPRESSIONS     1. Left ventricular ejection fraction, by estimation, is 60 to 65%. The   left ventricle has normal function. The left ventricle has no regional  wall motion abnormalities. Left ventricular diastolic parameters were  normal.   2. Right ventricular systolic function is normal. The right ventricular  size is normal. There is normal pulmonary artery systolic pressure. The  estimated right ventricular systolic pressure is 33.2 mmHg.   3. A small pericardial effusion is present. The pericardial effusion is  posterior to the left ventricle.   4. The mitral valve is grossly normal. Mild mitral valve regurgitation.   5. Tricuspid valve regurgitation is moderate.   6. The aortic valve has an indeterminant number of cusps, cannot exclude  functionally bicuspid valve. Aortic valve regurgitation is not visualized.  No aortic stenosis is present. Aortic valve mean gradient measures 7.0  mmHg. Calification noted within  LVOT and annulus.   7. The inferior vena cava is normal in size with greater than 50%  respiratory variability, suggesting right atrial pressure of 3 mmHg.   Comparison(s): Prior images reviewed side by side. LVEF 60-65%. Possible  functionally bicuspid aortic valve without stenosis.   cMRI: 01/2024 IMPRESSION: 1. The left ventricle is normal in cavity size and wall thickness. Systolic function was normal with LVEF 59%. No regional wall motion abnormalities.   2. The right ventricle is normal in cavity size and systolic function.   3. No significant valvular heart disease.   4. Delayed enhancement imaging demonstrates no evidence of myocardial infarction, inflammation, or infiltrative disorder.   5. Mild bilateral pleural effusions noted.  Cardiac Monitor: 01/2024 Patch Wear Time:  13 days and 19 hours (2025-09-20T16:11:18-0400 to 2025-10-04T11:57:13-0400)   Patient had a min HR of 47 bpm, max HR of 143 bpm, and avg HR of 65 bpm. Predominant underlying rhythm was Sinus Rhythm. 18 Supraventricular Tachycardia runs occurred, the run with the  fastest interval lasting 4 beats with a max rate of 143 bpm, the  longest lasting 16 beats with an avg rate of 112 bpm. Some episodes of Supraventricular Tachycardia may be possible Atrial Tachycardia with variable block. Isolated SVEs were rare (<1.0%), SVE Couplets were rare (<1.0%), and SVE Triplets were rare  (<1.0%). Isolated VEs were rare (<1.0%), and no VE Couplets or VE Triplets were present.  Risk Assessment/Calculations:   CHA2DS2-VASc Score = 4  This indicates a 4.8% annual risk of stroke. The patient's score is based upon: CHF History: 0 HTN History: 1 Diabetes History: 0 Stroke History: 0 Vascular Disease History: 0 Age Score: 2 Gender Score: 1   Physical Exam:   VS:  BP 108/68 (BP Location: Right Arm, Cuff Size: Normal)   Pulse 65   Ht 5' 2 (1.575 m)   Wt 101 lb 12.8 oz (46.2 kg)   SpO2 94%   BMI 18.62 kg/m    Wt Readings from Last 3 Encounters:  02/23/24 101 lb 12.8 oz (46.2 kg)  01/21/24 112 lb 10.5 oz (51.1 kg)  12/13/23 105 lb (47.6 kg)     GEN: Pleasant, thin female appearing in no acute distress NECK: No JVD; No carotid bruits CARDIAC: RRR, no murmurs, rubs, gallops RESPIRATORY:  Clear to auscultation without rales, wheezing or rhonchi  ABDOMEN: Appears non-distended. No obvious abdominal masses. EXTREMITIES: No clubbing or cyanosis. No pitting edema.  Distal pedal pulses are  2+ bilaterally.   Assessment and Plan:   1. PAF (paroxysmal atrial fibrillation)/Long-term Anticoagulation - She has a history of paroxysmal atrial fibrillation and recent monitor showed predominantly normal sinus rhythm with brief episodes of SVT and rare PAC's and PVC's but less than 1% burden. Flecainide  was discontinued during her recent admission with her being switched to Amiodarone . - She denies any recent palpitations and is in normal sinus rhythm by examination today. Will continue with Amiodarone  200 mg daily at this time. Plan for repeat LFT's and TSH within the next 6  months if not obtained by her PCP in the interim (TSH at 0.417 and LFT's WNL in 01/2024).  - Continue Eliquis  2.5 mg twice daily for anticoagulation which is the correct dose given her age at 83 years old and weight at 101 lbs.   2. History of chest pain - She has a longstanding history of chest pain and recent cardiac catheterization in 01/2024 showed angiographically normal coronary arteries. Continue with risk factor modification. Remains on Crestor  40 mg daily.  3. Orthostatic hypotension - Orthostatics checked today while in clinic and negative. Given no recurrent lower extremity edema, will stop Lasix  20 mg daily and have her only take this as needed for worsening edema, weight gain greater than 2 pounds overnight or greater than 5 pounds in 1 week. Encouraged her to increase water  consumption. Given episodes of elevated blood pressure at times, will reduce Midodrine  from 7.5 mg TID to 5 mg TID. Encouraged her to continue to follow blood pressure at home and reach out if she has elevated readings or hypotension in the interim.  Signed, Laymon CHRISTELLA Qua, PA-C

## 2024-02-23 NOTE — Patient Instructions (Addendum)
 Medication Instructions:  Your physician has recommended you make the following change in your medication:   -Decrease Midodrine  to 5 mg three times daily  -Please weigh yourself every morning. Take a Lasix  tablet (20mg ) if weight increases by 2 pounds overnight or 5 pounds in a single week.  *If you need a refill on your cardiac medications before your next appointment, please call your pharmacy*  Lab Work: None If you have labs (blood work) drawn today and your tests are completely normal, you will receive your results only by: MyChart Message (if you have MyChart) OR A paper copy in the mail If you have any lab test that is abnormal or we need to change your treatment, we will call you to review the results.  Testing/Procedures: None  Follow-Up: At [facility], you and your health needs are our priority.  As part of our continuing mission to provide you with exceptional heart care, our providers are all part of one team.  This team includes your primary Cardiologist (physician) and Advanced Practice Providers or APPs (Physician Assistants and Nurse Practitioners) who all work together to provide you with the care you need, when you need it.  Your next appointment:   2 month(s)  Provider:   You may see Alvan Carrier, MD or one of the following Advanced Practice Providers on your designated Care Team:   Laymon Qua, PA-C  [place], [place] Olivia [place], [place]     We recommend signing up for the patient portal called MyChart.  Sign up information is provided on this After Visit Summary.  MyChart is used to connect with patients for Virtual Visits (Telemedicine).  Patients are able to view lab/test results, encounter notes, upcoming appointments, etc.  Non-urgent messages can be sent to your provider as well.   To learn more about what you can do with MyChart, go to [email or web address].   Other Instructions

## 2024-02-24 ENCOUNTER — Encounter: Payer: Self-pay | Admitting: Family Medicine

## 2024-02-24 ENCOUNTER — Ambulatory Visit: Payer: Self-pay | Admitting: Family Medicine

## 2024-02-24 VITALS — BP 107/69 | HR 79 | Temp 98.5°F | Ht 62.0 in | Wt 99.0 lb

## 2024-02-24 DIAGNOSIS — T148XXA Other injury of unspecified body region, initial encounter: Secondary | ICD-10-CM

## 2024-02-24 DIAGNOSIS — S069X0D Unspecified intracranial injury without loss of consciousness, subsequent encounter: Secondary | ICD-10-CM | POA: Diagnosis not present

## 2024-02-24 DIAGNOSIS — E876 Hypokalemia: Secondary | ICD-10-CM | POA: Diagnosis not present

## 2024-02-24 DIAGNOSIS — I951 Orthostatic hypotension: Secondary | ICD-10-CM

## 2024-02-24 DIAGNOSIS — F1120 Opioid dependence, uncomplicated: Secondary | ICD-10-CM | POA: Diagnosis not present

## 2024-02-24 MED ORDER — HYDROCODONE-ACETAMINOPHEN 10-325 MG PO TABS: ORAL_TABLET | ORAL | 0 refills | Status: AC

## 2024-02-24 MED ORDER — HYDROCODONE-ACETAMINOPHEN 10-325 MG PO TABS: ORAL_TABLET | ORAL | 0 refills | Status: DC

## 2024-02-24 NOTE — Progress Notes (Signed)
 Subjective:    Patient ID: [name], female    DOB: 05/31/1940, 83 y.o.   MRN: [id number]  HPI  Hospital follow up - fall , current nausea and lightheaded , weak Eating and drinking and urinating ok Discussed the use of AI scribe software for clinical note transcription with the patient, who gave verbal consent to proceed. Patient has a lot of back pain and discomfort.  She has been on narcotic pain medicine as long as I have almost known to her she takes 4 tablets a day I did have a  conversation with her today with her son in daughter-in-law present about for her to try to taper down on the pain medicine even if it is just a half a pill less per day over the next month or 2 it would be a good step in the right direction the pain medicine she states does alleviate her pain and she denies it causing her any drowsiness or feeling drugged . History of Present Illness   [name] is an 83 year old female who presents with nausea and dizziness post-rehabilitation. She is accompanied by her sister and nieces who assist with her daily care.  She experiences nausea approximately three times a week, with each episode lasting about an hour. Medication for nausea alleviates the symptoms quickly. Her nutrition has been adjusted to two meals a day with an Ensure supplement, as she is not very hungry but makes an effort to eat to avoid feeling bloated or nauseous.  Dizziness and lightheadedness occur primarily when standing or moving, though not constant, they are frequent. She describes a general feeling of weakness throughout her body, particularly when using a walker, which she requires assistance with to prevent falls. She was previously using a wheelchair until recently. No dizziness or lightheadedness while sitting or lying down.  Her bowel movements are soft, aided by lactulose  and cream juice consumption. She is concerned about her fluid intake, drinking two 16-ounce bottles of  water  at night and additional fluids during the day, though her family believes she may not be drinking enough. Her urine is light yellow.  She continues to take pain medication for her back, four times a day, and has been on this regimen since her time in rehabilitation. She does not feel the medication makes her drowsy or loopy.  She has a history of a large hematoma on her face, which is now sore but not painful. It was previously a severe bruise and has been bleeding less recently.  She notes difficulty with memory and word retrieval since a fall, though she was not knocked out during the incident. Her family has observed some repetition in her questions, which she attributes to the fall.  She lives with her sister at night and her nieces during the day. She receives home health care with a therapist visiting twice a week and a nurse once a week.       Review of Systems  Respiratory:  Negative for cough, shortness of breath and wheezing.   Cardiovascular:  Negative for chest pain.  Gastrointestinal:  Positive for constipation, nausea and vomiting. Negative for abdominal pain and diarrhea.  Genitourinary:  Positive for frequency.       Objective:   Physical Exam Skin:    General: Skin is warm and dry.  Neurological:     Mental Status: She is alert.           Assessment & Plan:  1.  Traumatic brain injury, without loss of consciousness, subsequent encounter (Primary) CAT scan was negative for any type of hematoma or bleeding I doubt she had a stroke If her symptoms persist and do not resolve over the next 4 to 6 weeks in regards to forgetfulness and difficulty processing would recommend MRI I recommend that she keep yourself mentally engaged with visiting with family and friends as well as doing word games and simple puzzles  2. Orthostasis Continue current labs check lab work - Psychologist, clinical Panel ([facility]) - Magnesium   3. Hypokalemia Check lab work - Engineer, civil (consulting) ([facility])  4. Hematoma Facial hematoma it did bleed and go down but she has some residual what appears to be some soft tissue injury it could take weeks to resolve I would recommend warm compresses frequently she raises the question of plastic surgery certainly this type a referral could be done but I do not feel like it has to be done currently we will more than willing to offer if she would like - CBC with Differential  The patient was seen in followup for chronic pain. A review over at their current pain status was discussed. Drug registry was checked. Prescriptions were given.  Regular follow-up recommended. Discussion was held regarding the importance of compliance with medication as well as pain medication contract.  Patient was informed that medication may cause drowsiness and should not be combined  with other medications/alcohol or street drugs. If the patient feels medication is causing altered alertness then do not drive or operate dangerous equipment.  Should be noted that the patient appears to be meeting appropriate use of opioids and response.  Evidenced by improved function and decent pain control without significant side effects and no evidence of overt aberrancy issues.  Upon discussion with the patient today they understand that opioid therapy is optional and they feel that the pain has been refractory to reasonable conservative measures and is significant and affecting quality of life enough to warrant ongoing therapy and wishes to continue opioids.  Refills were provided.  [place]  medical Board guidelines regarding the pain medicine has been reviewed.  CDC guidelines most updated 2022 has been reviewed by the prescriber.  PDMP is checked on a regular basis yearly urine drug screen and pain management contract  Treatment plan for this patient includes #1-gentle stretching exercises as shown daily basis 2.  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

## 2024-02-25 ENCOUNTER — Ambulatory Visit: Payer: Self-pay | Admitting: Family Medicine

## 2024-02-25 DIAGNOSIS — N189 Chronic kidney disease, unspecified: Secondary | ICD-10-CM

## 2024-02-25 DIAGNOSIS — D72829 Elevated white blood cell count, unspecified: Secondary | ICD-10-CM

## 2024-02-25 DIAGNOSIS — D539 Nutritional anemia, unspecified: Secondary | ICD-10-CM

## 2024-02-25 LAB — CBC WITH DIFFERENTIAL/PLATELET
Basophils Absolute: 0 x10E3/uL (ref 0.0–0.2)
Basos: 0 %
EOS (ABSOLUTE): 0.1 x10E3/uL (ref 0.0–0.4)
Eos: 1 %
Hematocrit: 35 % (ref 34.0–46.6)
Hemoglobin: 11.4 g/dL (ref 11.1–15.9)
Immature Grans (Abs): 0 x10E3/uL (ref 0.0–0.1)
Immature Granulocytes: 0 %
Lymphocytes Absolute: 2.8 x10E3/uL (ref 0.7–3.1)
Lymphs: 22 %
MCH: 34.1 pg — ABNORMAL HIGH (ref 26.6–33.0)
MCHC: 32.6 g/dL (ref 31.5–35.7)
MCV: 105 fL — ABNORMAL HIGH (ref 79–97)
Monocytes Absolute: 1 x10E3/uL — ABNORMAL HIGH (ref 0.1–0.9)
Monocytes: 8 %
Neutrophils Absolute: 8.7 x10E3/uL — ABNORMAL HIGH (ref 1.4–7.0)
Neutrophils: 69 %
Platelets: 341 x10E3/uL (ref 150–450)
RBC: 3.34 x10E6/uL — ABNORMAL LOW (ref 3.77–5.28)
RDW: 12.4 % (ref 11.7–15.4)
WBC: 12.8 x10E3/uL — ABNORMAL HIGH (ref 3.4–10.8)

## 2024-02-25 LAB — BASIC METABOLIC PANEL WITH GFR
BUN/Creatinine Ratio: 15 (ref 12–28)
BUN: 19 mg/dL (ref 8–27)
CO2: 23 mmol/L (ref 20–29)
Calcium: 10.2 mg/dL (ref 8.7–10.3)
Chloride: 96 mmol/L (ref 96–106)
Creatinine, Ser: 1.27 mg/dL — ABNORMAL HIGH (ref 0.57–1.00)
Glucose: 97 mg/dL (ref 70–99)
Potassium: 4.8 mmol/L (ref 3.5–5.2)
Sodium: 135 mmol/L (ref 134–144)
eGFR: 42 mL/min/1.73 — ABNORMAL LOW (ref >59)

## 2024-02-25 LAB — MAGNESIUM: Magnesium: 1.8 mg/dL (ref 1.6–2.3)

## 2024-02-27 ENCOUNTER — Telehealth: Admitting: Family

## 2024-02-27 DIAGNOSIS — R399 Unspecified symptoms and signs involving the genitourinary system: Secondary | ICD-10-CM | POA: Diagnosis not present

## 2024-02-27 MED ORDER — CEPHALEXIN 500 MG PO CAPS: 500.0000 mg | ORAL_CAPSULE | Freq: Two times a day (BID) | ORAL | 0 refills | Status: DC

## 2024-02-27 NOTE — Progress Notes (Signed)

## 2024-03-06 DIAGNOSIS — R55 Syncope and collapse: Secondary | ICD-10-CM | POA: Diagnosis not present

## 2024-03-07 ENCOUNTER — Other Ambulatory Visit: Payer: Self-pay

## 2024-03-07 ENCOUNTER — Ambulatory Visit: Admitting: Student in an Organized Health Care Education/Training Program

## 2024-03-07 DIAGNOSIS — D72829 Elevated white blood cell count, unspecified: Secondary | ICD-10-CM

## 2024-03-07 DIAGNOSIS — D539 Nutritional anemia, unspecified: Secondary | ICD-10-CM

## 2024-03-07 DIAGNOSIS — N183 Chronic kidney disease, stage 3 unspecified: Secondary | ICD-10-CM

## 2024-03-08 NOTE — Telephone Encounter (Signed)
 Orders placed.

## 2024-03-13 ENCOUNTER — Encounter: Payer: Self-pay | Admitting: Radiology

## 2024-03-16 ENCOUNTER — Ambulatory Visit: Admitting: Nurse Practitioner

## 2024-03-16 ENCOUNTER — Ambulatory Visit: Payer: Self-pay | Admitting: *Deleted

## 2024-03-16 NOTE — Telephone Encounter (Signed)
 FYI Only or Action Required?: Action required by provider: update on patient condition and requesting medication.  Patient was last seen in primary care on 02/24/2024 by Alphonsa Glendia LABOR, MD.  Called Nurse Triage reporting Nausea.  Symptoms began several weeks ago.  Interventions attempted: Rest, hydration, or home remedies and Other: zofran .  Symptoms are: unchanged.  Triage Disposition: See PCP When Office is Open (Within 3 Days)  Patient/caregiver understands and will follow disposition?: No, wishes to speak with PCP              Copied from CRM #[id number]. Topic: Clinical - Red Word Triage >> Mar 16, 2024 11:27 AM Thersia BROCKS wrote: Red Word that prompted transfer to Nurse Triage: Patient called in stated she took a at home UTI test and stated it had a trace, no symptoms no burning or pain. But wanted to know if a antibiotic could be called in. Patient also wanted know if she can get something for nausea Reason for Disposition  Nausea lasts > 1 week  Answer Assessment - Initial Assessment Questions Patient requesting medication for nausea. Reports she has taken medication while in Rehab zofran  tiny white pill. Reports taking her family medication zofran  on her tongue and helped with nausea. Reports episodes every am upon awakening . Feels better throughout the day. Is tolerating food and liquids. Reports in am episodes of vomiting yellow emesis or dry heaves. Reports taking UTI test at home and showed trace couple of days ago . Was treated for UTI sx 02/27/24 with keflex  x 7 days. Denies sx now only at home test results. Recommended and Offered appt and patient reports she has appt in 2 weeks. Please advise if earlier appt needed or if antibiotics recommended and if nausea medication can be ordered.         1. NAUSEA SEVERITY: How bad is the nausea? (e.g., mild, moderate, severe; dehydration, weight loss)     Dry heaves or clear yellow emesis.  2. ONSET: When did  the nausea begin?     Started after fall and going to rehab  3. VOMITING: Any vomiting? If Yes, ask: How many times today?     At times dry heaves and some yellow color emesis 4. RECURRENT SYMPTOM: Have you had nausea before? If Yes, ask: When was the last time? What happened that time?     This am  5. CAUSE: What do you think is causing the nausea?     Not sure . Nausea noted every am and eases throughout the day  6. PREGNANCY: Is there any chance you are pregnant? (e.g., unprotected intercourse, missed birth control pill, broken condom)     na  Protocols used: Nausea-A-AH

## 2024-03-17 ENCOUNTER — Other Ambulatory Visit: Payer: Self-pay | Admitting: Cardiology

## 2024-03-17 ENCOUNTER — Other Ambulatory Visit: Payer: Self-pay | Admitting: Nurse Practitioner

## 2024-03-17 ENCOUNTER — Telehealth: Payer: Self-pay | Admitting: Family Medicine

## 2024-03-17 ENCOUNTER — Ambulatory Visit
Admission: EM | Admit: 2024-03-17 | Discharge: 2024-03-17 | Disposition: A | Discharge status: Home / Self Care | Attending: Nurse Practitioner | Admitting: Nurse Practitioner

## 2024-03-17 DIAGNOSIS — R300 Dysuria: Secondary | ICD-10-CM | POA: Insufficient documentation

## 2024-03-17 DIAGNOSIS — I480 Paroxysmal atrial fibrillation: Secondary | ICD-10-CM

## 2024-03-17 LAB — POCT URINE DIPSTICK
Blood, UA: NEGATIVE
Glucose, UA: NEGATIVE mg/dL
Nitrite, UA: NEGATIVE
POC PROTEIN,UA: 30 — AB
Spec Grav, UA: >=1.03 — AB (ref 1.010–1.025)
Urobilinogen, UA: 0.2 U/dL
pH, UA: 5.5 (ref 5.0–8.0)

## 2024-03-17 MED ORDER — CEFUROXIME AXETIL 250 MG PO TABS: 250.0000 mg | ORAL_TABLET | Freq: Two times a day (BID) | ORAL | 0 refills | Status: AC

## 2024-03-17 MED ORDER — ONDANSETRON 4 MG PO TBDP: 4.0000 mg | ORAL_TABLET | Freq: Three times a day (TID) | ORAL | 0 refills | Status: DC | PRN

## 2024-03-17 NOTE — Discharge Instructions (Signed)
 A urine culture is pending.  You will be contacted when the results of the urine culture are received.  You will also have access to the results via MyChart. Take medication as prescribed. Increase fluids and allow for plenty of rest. Recommend over-the-counter Tylenol  as needed for pain, fever, or general discomfort. Develop a toileting schedule that will allow you to urinate at least every 2 hours. Avoid caffeine such as tea, soda, or coffee while symptoms persist. As discussed, if your urine culture result is negative and you are continue to experience symptoms, recommend follow-up with your primary care physician for further evaluation. Follow-up as needed.

## 2024-03-17 NOTE — Telephone Encounter (Signed)
 Copied from CRM [id number]. Topic: Clinical - Prescription Issue >> Mar 17, 2024  1:30 PM Victoria B wrote: Reason for CRM: Tammy from [facility], called, states, med,prescribed,   ondansetron  interacts with patient's med, ondansetron  , and wants to know will something different be prescribed for this case

## 2024-03-17 NOTE — Telephone Encounter (Signed)
 Eliquis  2.5mg  refill request received. Patient is 83 years old, weight-44.9kg, Crea-1.27 on 02/24/24, Diagnosis-Afib, and last seen by Brittany Strader on 02/23/24. Dose is appropriate based on dosing criteria. Will send in refill to requested pharmacy.    Per last OV on 02/23/24-it states: Continue Eliquis  2.5 mg twice daily for anticoagulation which is the correct dose given her age at 83 years old and weight at 101 lbs.

## 2024-03-17 NOTE — ED Triage Notes (Signed)
 Pt reports she has frequent urination, and burning with urination x 2 days

## 2024-03-17 NOTE — ED Provider Notes (Addendum)
 [facility]    CSN: [id number] Arrival date & time: 03/17/24  1345      History   Chief Complaint Chief Complaint  Patient presents with   Urinary Frequency    HPI [name] is a 83 y.o. female.   The history is provided by the patient.   Patient presents with a 2-day history of burning with urination and urinary frequency.  Patient denies fever, chills, abdominal pain, nausea, vomiting, hematuria, urinary urgency, hesitancy, decreased urine stream, flank pain, low back pain, or vaginal symptoms.  Patient states she was treated for UTI approximately 2 weeks ago.  States that her symptoms returned.  She states that she took a home test for urinary tract infection which was positive.  Past Medical History:  Diagnosis Date   Adenocarcinoma, colon (HCC) 03/2005   Stage I; resected in 03/2005   Arthritis    Colon cancer [facility])    Removed surgicaly    Dysrhythmia    Gastroesophageal reflux disease    Gastroparesis    Hearing loss    mild   Hyperlipidemia    Irregular heartbeat    Ovarian cyst 2008   Pain in joint of right shoulder 06/29/2017   Pain in joint, ankle and foot 03/13/2014   Paroxysmal atrial fibrillation (HCC)    Palpitations; maintained on flecainide ; -normal coronary angiography and 1999; negative stress nuclear study in 11/2005   PONV (postoperative nausea and vomiting)    Tobacco abuse, in remission    10-pack-years; quit in 2001    Patient Active Problem List   Diagnosis Date Noted   Uncomplicated opioid dependence (HCC) 02/24/2024   Orthostasis 01/15/2024   Hyponatremia 01/15/2024   Leukocytosis 01/15/2024   Aortic atherosclerosis 05/25/2023   Chronic obstructive pulmonary disease (HCC) 05/25/2023   Osteoporosis 03/11/2023   Encounter for monitoring opioid maintenance therapy 03/26/2020   Loss of weight 01/23/2019   Adnexal mass 01/13/2019   Chronic pain syndrome 11/08/2018   Abnormal CT of the abdomen 06/23/2018   Gastric wall  thickening 06/23/2018   History of colonic polyps 04/27/2018   History of colon cancer 04/27/2018   Family hx of colon cancer 04/27/2018   Early satiety 09/20/2017   Lumbar spondylosis 03/24/2017   Sciatica of right side 03/06/2015   Knee pain, left 03/13/2014   Muscle weakness (generalized) 01/10/2014   Difficulty walking 01/10/2014   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   Sciatica of left side 04/26/2013   Chest pain at rest 03/21/2013   Prediabetes 12/30/2012   Paroxysmal atrial fibrillation (HCC)    GASTROESOPHAGEAL REFLUX DISEASE 06/24/2009   Hyperlipidemia 06/12/2009    Past Surgical History:  Procedure Laterality Date   ABDOMINAL HYSTERECTOMY  1983   BACK SURGERY     BIOPSY  10/18/2017   Procedure: BIOPSY;  Surgeon: Golda Claudis PENNER, MD;  [facility];  Service: Endoscopy;;  gastric   BIOPSY  07/06/2018   Procedure: BIOPSY;  Surgeon: Golda Claudis PENNER, MD;  [facility];  Service: Endoscopy;;  gastric   CATARACT EXTRACTION W/PHACO Right 12/22/2021   Procedure: CATARACT EXTRACTION PHACO AND INTRAOCULAR LENS PLACEMENT (IOC);  Surgeon: Harrie Agent, MD;  Location: [facility];  Service: Ophthalmology;  Laterality: Right;  CDE: 13.86   CATARACT EXTRACTION W/PHACO Left 01/05/2022   Procedure: CATARACT EXTRACTION PHACO AND INTRAOCULAR LENS PLACEMENT (IOC);  Surgeon: Harrie Agent, MD;  Location: [facility];  Service: Ophthalmology;  Laterality: Left;  CDE 21.39   CHOLECYSTECTOMY  COLON SURGERY  2006   Colon cancer   COLONOSCOPY  03/25/2012   Claudis RAYMOND Rivet, MD   COLONOSCOPY N/A 04/26/2017   Procedure: COLONOSCOPY;  Surgeon: Rivet Claudis RAYMOND, MD;  [facility];  Service: Endoscopy;  Laterality: N/A;  2:00   COLONOSCOPY N/A 07/06/2018   Procedure: COLONOSCOPY;  Surgeon: Rivet Claudis RAYMOND, MD;  [facility];  Service: Endoscopy;  Laterality: N/A;  10:30   COLONOSCOPY WITH PROPOFOL  N/A 09/10/2021   Procedure: COLONOSCOPY WITH  PROPOFOL ;  Surgeon: Rivet Claudis RAYMOND, MD;  [facility];  Service: Endoscopy;  Laterality: N/A;  910   COSMETIC SURGERY  09/2007   ESOPHAGOGASTRODUODENOSCOPY N/A 10/18/2017   Procedure: ESOPHAGOGASTRODUODENOSCOPY (EGD);  Surgeon: Rivet Claudis RAYMOND, MD;  [facility];  Service: Endoscopy;  Laterality: N/A;   ESOPHAGOGASTRODUODENOSCOPY N/A 07/06/2018   Procedure: ESOPHAGOGASTRODUODENOSCOPY (EGD);  Surgeon: Rivet Claudis RAYMOND, MD;  [facility];  Service: Endoscopy;  Laterality: N/A;   GIVENS CAPSULE STUDY N/A 12/26/2019   Procedure: GIVENS CAPSULE STUDY;  Surgeon: Rivet Claudis RAYMOND, MD;  [facility];  Service: Endoscopy;  Laterality: N/A;  730   LEFT HEART CATH AND CORONARY ANGIOGRAPHY N/A 01/21/2024   Procedure: LEFT HEART CATH AND CORONARY ANGIOGRAPHY;  Surgeon: Anner Alm ORN, MD;  [facility];  Service: Cardiovascular;  Laterality: N/A;   PARTIAL COLECTOMY  2006   adenocarcinoma   POLYPECTOMY  04/26/2017   Procedure: POLYPECTOMY;  Surgeon: Rivet Claudis RAYMOND, MD;  [facility];  Service: Endoscopy;;  colon   POLYPECTOMY  07/06/2018   Procedure: POLYPECTOMY;  Surgeon: Rivet Claudis RAYMOND, MD;  [facility];  Service: Endoscopy;;  colon    POLYPECTOMY  09/10/2021   Procedure: POLYPECTOMY;  Surgeon: Rivet Claudis RAYMOND, MD;  [facility];  Service: Endoscopy;;  transverse x2;sigmoid   TONSILLECTOMY      OB History     Gravida  1   Para  1   Term      Preterm      AB      Living         SAB      IAB      Ectopic      Multiple      Live Births               Home Medications    Prior to Admission medications   Medication Sig Start Date End Date Taking? Authorizing Provider  cefUROXime  (CEFTIN ) 250 MG tablet Take 1 tablet (250 mg total) by mouth 2 (two) times daily with a meal for 7 days. 03/17/24 03/24/24 Yes Leath-Warren, Etta PARAS, NP  alendronate  (FOSAMAX ) 70 MG tablet TAKE ONE  TABLET (70 MG TOTAL) BY MOUTH EVERY 7 DAYS. TAKE WITH A FULL GLASS OF WATER  ON AN EMPTY STOMACH. 09/03/23   Alphonsa Glendia LABOR, MD  amiodarone  (PACERONE ) 200 MG tablet Take 1 tablet (200 mg total) by mouth 2 (two) times daily for 11 days, THEN 1 tablet (200 mg total) daily. 01/27/24 03/08/24  Vernon Ranks, MD  apixaban  (ELIQUIS ) 2.5 MG TABS tablet Take 1 tablet (2.5 mg total) by mouth 2 (two) times daily. 03/03/23   Alvan Dorn FALCON, MD  Calcium  Carbonate (CALCIUM  600 PO) Take 600 mg by mouth daily.    [provider]  cephALEXin  (KEFLEX ) 500 MG capsule Take 1 capsule (500 mg total) by mouth 2 (two) times daily. 02/27/24  Hawks, Christy A, FNP  CONSTULOSE  10 GM/15ML solution TAKE 30ML'S (20 GRAMS TOTAL) BY MOUTH ATBEDTIME 12/02/23   Alphonsa Glendia LABOR, MD  ferrous sulfate 325 (65 FE) MG tablet Take 325 mg by mouth daily with breakfast.    [provider]  furosemide  (LASIX ) 20 MG tablet Take 1 tablet (20 mg total) by mouth daily as needed for fluid or edema. 02/23/24   Strader, Brittany M, PA-C  HYDROcodone -acetaminophen  (NORCO) 10-325 MG tablet 1 tablet up to 4 times per day as needed 02/24/24   Alphonsa Glendia LABOR, MD  HYDROcodone -acetaminophen  (NORCO) 10-325 MG tablet One qid prn pain 02/24/24   Alphonsa Glendia LABOR, MD  midodrine  (PROAMATINE ) 5 MG tablet Take 1 tablet (5 mg total) by mouth 3 (three) times daily with meals. 02/23/24   Strader, Laymon HERO, PA-C  omeprazole (PRILOSEC) 20 MG capsule Take 20 mg by mouth daily before breakfast.     [provider]  ondansetron  (ZOFRAN -ODT) 4 MG disintegrating tablet Take 1 tablet (4 mg total) by mouth every 8 (eight) hours as needed for nausea. 03/17/24   Hoskins, Carolyn C, NP  rosuvastatin  (CRESTOR ) 40 MG tablet TAKE ONE TABLET (40MG  TOTAL) BY MOUTH DAILY 03/19/23   Alvan Dorn FALCON, MD  vitamin B-12 (CYANOCOBALAMIN ) 1000 MCG tablet Take 1,000 mcg by mouth daily.    [provider]    Family History Family History  Problem  Relation Age of Onset   Heart disease Mother    Stroke Mother    Heart attack Mother    Cancer Father        lung   Heart disease Father    Hypertension Father    Cancer Brother        rectal   Healthy Son    Heart failure Sister    Stroke Sister     Social History Social History   Tobacco Use   Smoking status: Former    Current packs/day: 0.00    Average packs/day: 0.3 packs/day for 40.0 years (12.0 ttl pk-yrs)    Types: Cigarettes    Start date: 02/23/1963    Quit date: 03/25/2002    Years since quitting: 21.9   Smokeless tobacco: Never   Tobacco comments:    Quit smoking in 2001  Vaping Use   Vaping status: Never Used  Substance Use Topics   Alcohol use: No    Alcohol/week: 0.0 standard drinks of alcohol   Drug use: No     Allergies   Flecainide    Review of Systems Review of Systems Per HPI  Physical Exam Triage Vital Signs ED Triage Vitals  Encounter Vitals Group     BP 03/17/24 1358 123/67     Girls Systolic BP Percentile --      Girls Diastolic BP Percentile --      Boys Systolic BP Percentile --      Boys Diastolic BP Percentile --      Pulse Rate 03/17/24 1358 (!) 55     Resp 03/17/24 1358 18     Temp 03/17/24 1358 97.9 F (36.6 C)     Temp Source 03/17/24 1358 Oral     SpO2 --      Weight --      Height --      Head Circumference --      Peak Flow --      Pain Score 03/17/24 1400 0     Pain Loc --      Pain Education --  Exclude from Growth Chart --    No data found.  Updated Vital Signs BP 123/67   Pulse (!) 55   Temp 97.9 F (36.6 C) (Oral)   Resp 18   Visual Acuity Right Eye Distance:   Left Eye Distance:   Bilateral Distance:    Right Eye Near:   Left Eye Near:    Bilateral Near:     Physical Exam Vitals and nursing note reviewed.  Constitutional:      General: She is not in acute distress.    Appearance: Normal appearance.  HENT:     Head: Normocephalic.  Eyes:     Extraocular Movements: Extraocular  movements intact.     Pupils: Pupils are equal, round, and reactive to light.  Cardiovascular:     Rate and Rhythm: Normal rate and regular rhythm.     Pulses: Normal pulses.     Heart sounds: Normal heart sounds.  Pulmonary:     Effort: Pulmonary effort is normal. No respiratory distress.     Breath sounds: Normal breath sounds. No stridor. No wheezing, rhonchi or rales.  Abdominal:     General: Bowel sounds are normal.     Palpations: Abdomen is soft.     Tenderness: There is no abdominal tenderness. There is no right CVA tenderness or left CVA tenderness.  Musculoskeletal:     Cervical back: Normal range of motion.  Skin:    General: Skin is warm and dry.  Neurological:     General: No focal deficit present.     Mental Status: She is alert and oriented to person, place, and time.  Psychiatric:        Mood and Affect: Mood normal.        Behavior: Behavior normal.      UC Treatments / Results  Labs (all labs ordered are listed, but only abnormal results are displayed) Labs Reviewed  POCT URINE DIPSTICK - Abnormal; Notable for the following components:      Result Value   Bilirubin, UA small (*)    Ketones, POC UA trace (5) (*)    Spec Grav, UA >=1.030 (*)    POC PROTEIN,UA =30 (*)    Leukocytes, UA Trace (*)    All other components within normal limits  URINE CULTURE    EKG   Radiology No results found.  Procedures Procedures (including critical care time)  Medications Ordered in UC Medications - No data to display  Initial Impression / Assessment and Plan / UC Course  I have reviewed the triage vital signs and the nursing notes.  Pertinent labs & imaging results that were available during my care of the patient were reviewed by me and considered in my medical decision making (see chart for details).  Urinalysis is positive for trace leukocytes and protein.  Given the patient's current symptoms, we will treat empirically for acute urinary tract infection  while the urine culture is pending.  Treat with Ceftin  250 mg twice daily for the next 7 days (creatinine clearance was calculated which was 25 based on creatinine of 1.27 dated 02/24/2024, renal dosing was administered).  Supportive care recommendations were provided and discussed with the patient to include fluids, over-the-counter Tylenol , developing a toileting schedule, and avoiding caffeine.  Patient was advised if the results of the culture are negative and she is continue to experience symptoms, recommend follow-up with her PCP for further evaluation.  Patient was in agreement with this plan of care and verbalizes understanding.  All questions were answered.  Patient stable for discharge.  Final Clinical Impressions(s) / UC Diagnoses   Final diagnoses:  Dysuria     Discharge Instructions      A urine culture is pending.  You will be contacted when the results of the urine culture are received.  You will also have access to the results via MyChart. Take medication as prescribed. Increase fluids and allow for plenty of rest. Recommend over-the-counter Tylenol  as needed for pain, fever, or general discomfort. Develop a toileting schedule that will allow you to urinate at least every 2 hours. Avoid caffeine such as tea, soda, or coffee while symptoms persist. As discussed, if your urine culture result is negative and you are continue to experience symptoms, recommend follow-up with your primary care physician for further evaluation. Follow-up as needed.     ED Prescriptions     Medication Sig Dispense Auth. Provider   cefUROXime  (CEFTIN ) 250 MG tablet Take 1 tablet (250 mg total) by mouth 2 (two) times daily with a meal for 7 days. 14 tablet Leath-Warren, Etta PARAS, NP      PDMP not reviewed this encounter.   Gilmer Etta PARAS, NP 03/17/24 1435    Leath-Warren, Etta PARAS, NP 03/17/24 1436    Leath-Warren, Etta PARAS, NP 03/17/24 1437

## 2024-03-18 LAB — URINE CULTURE: Culture: <10000 — AB

## 2024-03-20 ENCOUNTER — Ambulatory Visit (HOSPITAL_COMMUNITY): Payer: Self-pay

## 2024-03-20 ENCOUNTER — Telehealth: Payer: Self-pay | Admitting: Cardiology

## 2024-03-20 ENCOUNTER — Other Ambulatory Visit: Payer: Self-pay | Admitting: Nurse Practitioner

## 2024-03-20 NOTE — Telephone Encounter (Signed)
 Pt c/o medication issue:  1. Name of Medication: amiodarone  (PACERONE ) 200 MG tablet (Expired)   2. How are you currently taking this medication (dosage and times per day)? As written  3. Are you having a reaction (difficulty breathing--STAT)? no  4. What is your medication issue? Pt feels it may be the medication making her nauseous and vomiting. She is lightheaded/dizzy when she gets up.

## 2024-03-20 NOTE — Telephone Encounter (Signed)
 Patient went to Urgent care 03/17/24

## 2024-03-20 NOTE — Telephone Encounter (Signed)
 Reports nausea started 3-4 weeks ago, occurring about 4-5 times per week. Reports it usually happens when she first get up in the mornings and says she has vomited a few times. Reports bowels have regular and denies diarrhea. Reports lightheadedness has improved. Denies SOB or chest pain. Advised the only way to determine if amiodarone  was the cause of her symptoms would be to hold it and monitor for improvement. Advised message would be sent to provider. ER precautions provided. Verbalized understanding of plan.

## 2024-03-20 NOTE — Telephone Encounter (Signed)
    She was switched from Flecainide  to Amiodarone  during her recent admission and no other ideal options given her prolonged QT and not a candidate for ablation. Would recommend reducing Amiodarone  to 100mg  daily (will need to cut tablets in half) to see if this helps with her symptoms. Make sure she takes Amiodarone  with food. Could also try taking at night if she prefers. Amiodarone  has a long half-life, so it will take a few weeks to see if she notices a change with the dose reduction.    Signed, Laymon CHRISTELLA Qua, PA-C 03/20/2024, 8:55 PM Pager: [phone]

## 2024-03-21 ENCOUNTER — Other Ambulatory Visit: Payer: Self-pay | Admitting: Nurse Practitioner

## 2024-03-21 MED ORDER — AMIODARONE HCL 200 MG PO TABS: 100.0000 mg | ORAL_TABLET | Freq: Every morning | ORAL | Status: AC

## 2024-03-21 MED ORDER — DEXAMETHASONE 4 MG PO TABS: ORAL_TABLET | ORAL | 0 refills | Status: DC

## 2024-03-21 NOTE — Telephone Encounter (Signed)
 Patient stated she would like to try it for nausea and would like it sent to [facility]

## 2024-03-21 NOTE — Telephone Encounter (Signed)
 Pt notified of B.Strader's response and she agrees with plan of care to decrease Amiodarone  to 100 mg daily at night.

## 2024-03-22 NOTE — Telephone Encounter (Signed)
 Pt calling to follow up about taking 100 mg of the following medication. She wants to verify if that is correct. She preferred to speak  with a nurse. Pleas advise.

## 2024-03-22 NOTE — Telephone Encounter (Signed)
 I told patient her dose of amiodarone  is 100 mg, she verbalized understanding

## 2024-03-24 ENCOUNTER — Ambulatory Visit: Payer: Self-pay | Admitting: Internal Medicine

## 2024-03-27 ENCOUNTER — Ambulatory Visit: Payer: Self-pay | Admitting: Family Medicine

## 2024-03-27 ENCOUNTER — Encounter: Payer: Self-pay | Admitting: Family Medicine

## 2024-03-27 VITALS — BP 121/63 | HR 60 | Temp 98.2°F | Ht 62.0 in | Wt 101.0 lb

## 2024-03-27 DIAGNOSIS — R112 Nausea with vomiting, unspecified: Secondary | ICD-10-CM

## 2024-03-27 DIAGNOSIS — R251 Tremor, unspecified: Secondary | ICD-10-CM | POA: Diagnosis not present

## 2024-03-27 DIAGNOSIS — J300 Vasomotor rhinitis: Secondary | ICD-10-CM | POA: Diagnosis not present

## 2024-03-27 DIAGNOSIS — I959 Hypotension, unspecified: Secondary | ICD-10-CM

## 2024-03-27 DIAGNOSIS — M8580 Other specified disorders of bone density and structure, unspecified site: Secondary | ICD-10-CM | POA: Diagnosis not present

## 2024-03-27 MED ORDER — IPRATROPIUM BROMIDE 0.03 % NA SOLN: 2.0000 | Freq: Two times a day (BID) | NASAL | 12 refills | Status: AC

## 2024-03-27 NOTE — Patient Instructions (Signed)
 May stop Fosamax 

## 2024-03-27 NOTE — Progress Notes (Signed)
 Subjective:    Patient ID: [name], female    DOB: 09-28-1940, 83 y.o.   MRN: [id number] Here for f/u. Has several concerns including dizziness, anxiousness and fosamax .  HPI History of Present Illness   [name] is an 83 year old female with atrial fibrillation who presents with persistent nausea and vomiting.  She experiences nausea and vomiting five out of seven days a week, often relieved after vomiting, although sometimes only liquid is expelled. These symptoms began after starting amiodarone , which was initiated following the ineffectiveness of flecainide . She has attempted to take amiodarone  at different times of the day and at a reduced dose of 100 mg, but the symptoms persist.  She has a history of taking Fosamax  for four years but has not taken it for the past three months. Fosamax  previously caused morning nausea, and she is uncertain about resuming this medication.  She experiences morning tremors that improve as the day progresses, which began after starting amiodarone . She also has a persistent runny nose, described as watery and constant, which was present before starting amiodarone .  She experiences episodes of low blood pressure, described as a 'wave' washing over her, causing her to feel faint. She is on midodrine  to manage her blood pressure, but the dosage has been adjusted frequently. She is concerned about her balance and stability, especially when not using a walker, and has considered using a cane.  She has a history of congestive heart failure, which she believes was exacerbated by fluid overload during a hospital stay.        Review of Systems     Objective:   Physical Exam  General-in no acute distress Eyes-no discharge Lungs-respiratory rate normal, CTA CV-no murmurs,RRR Extremities skin warm dry no edema Neuro grossly normal Behavior normal, alert Patient does have slight tremor in the hands Patient walks with a walker she is  slow but well-balanced.  Nausea and vomiting likely due to amiodarone  Nausea and vomiting likely related to amiodarone , impacting quality of life. Dexamethasone  prescribed but not recommended for frequent use due to risks. - Communicated with cardiology to discuss alternative medications to amiodarone . - Advised to keep gastroenterology appointment next week for further evaluation.  Tremor likely medication-induced Tremor likely related to amiodarone  use. - Communicated with cardiology to discuss alternative medications to amiodarone .  Vasomotor rhinitis Chronic watery rhinorrhea likely due to vasomotor rhinitis, common in older adults. - Prescribed nasal spray for vasomotor rhinitis to be used daily for 2-3 weeks.  Hypotension Episodes of hypotension with systolic blood pressure as low as 69 mmHg, possibly related to medication changes and age-related autonomic dysfunction. - Continue to monitor blood pressure closely. - Communicated with cardiology to optimize blood pressure management.  Osteopenia Present but not severe osteoporosis. Fosamax  discontinued due to nausea and current bone status. - Discontinued Fosamax .    Assessment & Plan:  1. Vasomotor rhinitis (Primary) Atrovent nasal spray  2. Nausea and vomiting, unspecified vomiting type Dramamine as needed caution drowsiness Could well be related to amiodarone  Has appointment coming up with gastroenterology next week as well as cardiology in a few weeks Unfortunately amiodarone  does not get along with Zofran  or Phenergan drug interactions  3. Tremor She states this has been onset since starting amiodarone  This is a side effect of a small number of people We will alert cardiology Patient has a difficult situation not sure what other medicines would be suitable for her from a cardiology standpoint  4. Osteopenia, unspecified location We will  stop Fosamax  she has been on it for 1/2 years last bone density showed  osteopenia it is reasonable to consider stopping this might help some of her GI symptoms but clearly her vomiting and tremor have started since being started on amiodarone   5. Hypotension, unspecified hypotension type She does use her midodrine  3 times daily to try to help keep this under decent control she uses a walker to help her with her balance

## 2024-03-30 ENCOUNTER — Telehealth: Payer: Self-pay | Admitting: Family Medicine

## 2024-03-30 NOTE — Telephone Encounter (Signed)
 Nurses Please talk with patient Patient was having severe nausea more than likely related to her amiodarone .  Cardiology reduce the dose.  Please tell the patient I did touch base with her cardiologist.  They stated that with the reduced dose of the amiodarone  he could take 10 to 14 days to see improvement of the nausea.  They also stated that if the nausea is not improving over the course of the next 10 to 14 days to let them know then they would get her in with an electrophysiologist and more than likely try to get her off of the amiodarone   Please have the patient send us  an update or call us  and update within 10 to 14 days on how she is doing.  If the patient feels like this is too overwhelming and she cannot tolerate another 10 to 14 days please let me know and we will initiate getting her back in with cardiology  She does have an appointment with cardiology on 11 December  Thanks-Dr. Glendia See copied below regarding documentation from cardiology  Strader, Laymon HERO, PA-C  Alphonsa Glendia LABOR, MD Hi Dr. Alphonsa,  Thank you for making me aware. We just reduced Amiodarone  to 100mg  daily last week when she contacted our office in regard to the symptoms. Given its long half-life, it will take several weeks to see the effect. If still intolerant, I can refer her back to EP but options are limited since she failed Flecainide  and not a candidate for Tikosyn given prolonged QT. Would likely be looking at ablation +/- PPM.  Thanks, Brittany

## 2024-04-04 ENCOUNTER — Encounter: Payer: Self-pay | Admitting: Gastroenterology

## 2024-04-04 ENCOUNTER — Ambulatory Visit: Admitting: Gastroenterology

## 2024-04-04 VITALS — BP 97/62 | HR 73 | Temp 97.7°F | Ht 62.0 in | Wt 101.6 lb

## 2024-04-04 DIAGNOSIS — R112 Nausea with vomiting, unspecified: Secondary | ICD-10-CM

## 2024-04-04 DIAGNOSIS — D509 Iron deficiency anemia, unspecified: Secondary | ICD-10-CM

## 2024-04-04 DIAGNOSIS — K5900 Constipation, unspecified: Secondary | ICD-10-CM | POA: Diagnosis not present

## 2024-04-04 MED ORDER — LACTULOSE 10 GM/15ML PO SOLN: 10.0000 g | Freq: Every day | ORAL | 3 refills | Status: AC | PRN

## 2024-04-04 NOTE — Patient Instructions (Addendum)
 Continue lactulose  as needed for constipation Continue MiraLAX daily in the mornings for constipation.  Continue over-the-counter omeprazole (Prilosec) 20 mg daily for reflux.  I am glad that you are having decreased nausea and vomiting with the decreased dose of amiodarone  and I do suspect that given the timing of your symptoms and when you had a medication change that this is likely the cause of your symptoms however if they become more prominent or severe and unable to control your symptoms please let me know and at that point I would recommend considering increasing your omeprazole dose.  We cannot really treat your nausea and vomiting with Zofran  or Phenergan given the interactions with amiodarone .  Will plan to follow-up in April to see how you are doing from a cardiac standpoint and with your dizziness so we can have a discussion regarding the risk versus benefits of performing a colonoscopy.  It was a pleasure to see you today. I want to create trusting relationships with patients. If you receive a survey regarding your visit,  I greatly appreciate you taking time to fill this out on paper or through your MyChart. I value your feedback.  Charmaine Melia, MSN, FNP-BC, AGACNP-BC [facility]

## 2024-04-04 NOTE — Progress Notes (Signed)
 GI Office Note    Referring Provider: Alphonsa Glendia LABOR, MD Primary Care Physician:  Alphonsa Glendia LABOR, MD Primary Gastroenterologist: Toribio Rubins, MD  Date:  04/04/2024  ID:  [name], DOB 1940/07/14, MRN [id number]   Chief Complaint   Chief Complaint  Patient presents with   Follow-up    Follow up. Nausea and vomiting. Having some dizziness and lightheadedness     History of Present Illness  [name] is a 83 y.o. female with a history of colon cancer s/p resection in 2006, GERD, gastroparesis, HLD, A-fib, and tobacco abuse presenting today with complaint of dizziness, lightheadedness, nausea, and vomiting.  EGD February 2020: -Normal esophagus -2 cm hiatal hernia -3 gastric polyps s/p biopsy -Normal antral mucosa s/p biopsy -Path: Negative for H. pylori.   Capsule endoscopy August 2021: Incomplete as capsule did not reach cecum.  2 small jejunal diverticula without stigmata of bleeding, 2 areas of minus.  Advised CT enterography.   Colonoscopy May 2023: -3 (4 to 6 mm) polyps in the sigmoid and transverse colon -Patent end-to-side colocolonic anastomosis with healthy-appearing tissue -Polyps consistent with tubular adenoma x 3 fragments -Repeat colonoscopy in 3 years for surveillance   Office visit 02/17/2022.  Taking iron daily.  Denied any melena, BRBPR, lack of appetite, early satiety, N/V, chest pain, shortness of breath, restless leg, pica, weight loss.  No upper GI complaints.  Having regular bowel movements with lactulose .  Having poor energy but likely secondary to taking care of her husband.  Usually gets about 3-4 hours of sleep nightly.  On midodrine  which is controlling her blood pressure. Plan check CBC and iron panel, continue oral iron therapy and lactulose .   Last office visit 08/25/22. Constipation well-controlled with lactulose  15 mL daily.  Recently lost her husband 3 weeks prior.  Denied any upper or lower GI complaints.  Taking iron once  daily.  Advised to continue current regimen.  Recheck CBC and iron panel.  Discussed if no improvement or further decline then would need to repeat capsule endoscopy.  Last office visit 03/01/2023.  Reportedly still having some issues with blood pressures running low.  Had been taken off midodrine  briefly.  Going to see PCP soon as well as cardiology.  Having dizziness related to blood pressure drops.  Taking lactulose  10 g nightly in the past, usually will take every other night.  Having daily bowel movement without difficulty.  Continuing to take her oral iron.  Advised continue her oral iron as well as B12 supplementation and lactulose  daily for constipation.  Follow-up in 1 year.  Advised to follow-up with PCP in regards to low blood pressures.  Recent labs in October hemoglobin normal at 11.4, mildly elevated WBC at 12.8.  Normal magnesium .  Creatinine mildly elevated at 1.27.  Recently seen by PCP 03/27/2024.  Noted some nausea and vomiting which felt could be related to amiodarone .  Advise Dramamine as needed but caution regarding drowsiness.  Unfortunately amiodarone  does not interact well with Zofran  or Phenergan given drug interactions.  Has a tremor which she states started since starting on amiodarone .  On 11/11 cardiology recommended decreasing her amiodarone  to 100 mg at night.  She had been switched from flecainide  during recent admission to amiodarone .  Given prolonged QT there is no other Ideal options for her and she is not a candidate for ablation.  When she originally reported the nausea to cardiology she stated it was occurring 4-5 times per week and was mostly happening  in the mornings that she was having regular bowel movements without any diarrhea.  She reported her lightheadedness had improved and did not any shortness of breath or chest pain.  Today:  Discussed the use of AI scribe software for clinical note transcription with the patient, who gave verbal consent to  proceed.  She is accompanied by her sister who is waiting in the lobby.   On September 6th, she experienced a fall due to a sudden drop in blood pressure while preparing breakfast, resulting in a significant facial bruise the size of an orange. She did not sustain any fractures or bleeding. She was hospitalized for a week, followed by a week at a rehabilitation facility, and then a month of home rehabilitation. She is currently using a walker but attempted to walk without it, which was challenging due to balance issues and weakness.  She experiences nausea, dizziness, and lightheadedness, particularly in the mornings. She reports that these symptoms began after a change in her medication regimen (Amiodarone  started in place of flecainide ). The nausea began after returning home from rehabilitation, coinciding with a medication change. She was initially taking a full dose of a medication in the morning, which was reduced to half a dose taken at night, leading to some improvement in symptoms.  She has a history of low blood pressure, which she believes is the root cause of her fall. Her blood pressure has been unstable, with sudden drops that require her to sit down immediately. She is on blood pressure medication, which was adjusted after a breakthrough in her AFib medication affected her blood pressure control.  She is currently taking several medications, including amiodarone , Eliquis , iron supplements, and vitamins D3 and B12. She uses lactulose  as needed, approximately three times a week, and manages her reflux with over-the-counter Prilosec. She also drinks prune juice and uses Miralax in her coffee to maintain bowel regularity.  She is concerned about the persistent facial bruise. She is not currently driving due to dizziness and relies on her sister for transportation.      Wt Readings from Last 6 Encounters:  04/04/24 101 lb 9.6 oz (46.1 kg)  03/27/24 101 lb (45.8 kg)  02/24/24 99 lb (44.9  kg)  02/23/24 101 lb 12.8 oz (46.2 kg)  01/21/24 112 lb 10.5 oz (51.1 kg)  12/13/23 105 lb (47.6 kg)    Body mass index is 18.58 kg/m.   Current Outpatient Medications  Medication Sig Dispense Refill   amiodarone  (PACERONE ) 200 MG tablet Take 0.5 tablets (100 mg total) by mouth every morning.     Calcium  Carbonate (CALCIUM  600 PO) Take 600 mg by mouth daily.     CONSTULOSE  10 GM/15ML solution TAKE 30ML'S (20 GRAMS TOTAL) BY MOUTH ATBEDTIME 946 mL 3   dexamethasone  (DECADRON ) 4 MG tablet Take one tab po every day prn nausea/vomiting 5 tablet 0   ELIQUIS  2.5 MG TABS tablet TAKE ONE TABLET BY MOUTH TWICE A DAY 60 tablet 11   ferrous sulfate 325 (65 FE) MG tablet Take 325 mg by mouth daily with breakfast.     furosemide  (LASIX ) 20 MG tablet Take 1 tablet (20 mg total) by mouth daily as needed for fluid or edema. 30 tablet 0   HYDROcodone -acetaminophen  (NORCO) 10-325 MG tablet 1 tablet up to 4 times per day as needed 120 tablet 0   HYDROcodone -acetaminophen  (NORCO) 10-325 MG tablet One qid prn pain 120 tablet 0   ipratropium (ATROVENT ) 0.03 % nasal spray Place 2 sprays into both  nostrils every 12 (twelve) hours. 30 mL 12   midodrine  (PROAMATINE ) 5 MG tablet Take 1 tablet (5 mg total) by mouth 3 (three) times daily with meals. 270 tablet 3   omeprazole (PRILOSEC) 20 MG capsule Take 20 mg by mouth daily before breakfast.      rosuvastatin  (CRESTOR ) 40 MG tablet TAKE ONE TABLET (40MG  TOTAL) BY MOUTH DAILY 90 tablet 3   vitamin B-12 (CYANOCOBALAMIN ) 1000 MCG tablet Take 1,000 mcg by mouth daily.     cephALEXin  (KEFLEX ) 500 MG capsule Take 1 capsule (500 mg total) by mouth 2 (two) times daily. (Patient not taking: Reported on 03/27/2024) 14 capsule 0   No current facility-administered medications for this visit.    Past Medical History:  Diagnosis Date   Adenocarcinoma, colon (HCC) 03/2005   Stage I; resected in 03/2005   Arthritis    Colon cancer [facility])    Removed surgicaly    Dysrhythmia     Gastroesophageal reflux disease    Gastroparesis    Hearing loss    mild   Hyperlipidemia    Irregular heartbeat    Ovarian cyst 2008   Pain in joint of right shoulder 06/29/2017   Pain in joint, ankle and foot 03/13/2014   Paroxysmal atrial fibrillation (HCC)    Palpitations; maintained on flecainide ; -normal coronary angiography and 1999; negative stress nuclear study in 11/2005   PONV (postoperative nausea and vomiting)    Tobacco abuse, in remission    10-pack-years; quit in 2001    Past Surgical History:  Procedure Laterality Date   ABDOMINAL HYSTERECTOMY  1983   BACK SURGERY     BIOPSY  10/18/2017   Procedure: BIOPSY;  Surgeon: Golda Claudis PENNER, MD;  [facility];  Service: Endoscopy;;  gastric   BIOPSY  07/06/2018   Procedure: BIOPSY;  Surgeon: Golda Claudis PENNER, MD;  [facility];  Service: Endoscopy;;  gastric   CATARACT EXTRACTION W/PHACO Right 12/22/2021   Procedure: CATARACT EXTRACTION PHACO AND INTRAOCULAR LENS PLACEMENT (IOC);  Surgeon: Harrie Agent, MD;  Location: [facility];  Service: Ophthalmology;  Laterality: Right;  CDE: 13.86   CATARACT EXTRACTION W/PHACO Left 01/05/2022   Procedure: CATARACT EXTRACTION PHACO AND INTRAOCULAR LENS PLACEMENT (IOC);  Surgeon: Harrie Agent, MD;  Location: [facility];  Service: Ophthalmology;  Laterality: Left;  CDE 21.39   CHOLECYSTECTOMY     COLON SURGERY  2006   Colon cancer   COLONOSCOPY  03/25/2012   Claudis PENNER Golda, MD   COLONOSCOPY N/A 04/26/2017   Procedure: COLONOSCOPY;  Surgeon: Golda Claudis PENNER, MD;  [facility];  Service: Endoscopy;  Laterality: N/A;  2:00   COLONOSCOPY N/A 07/06/2018   Procedure: COLONOSCOPY;  Surgeon: Golda Claudis PENNER, MD;  [facility];  Service: Endoscopy;  Laterality: N/A;  10:30   COLONOSCOPY WITH PROPOFOL  N/A 09/10/2021   Procedure: COLONOSCOPY WITH PROPOFOL ;  Surgeon: Golda Claudis PENNER, MD;  [facility];  Service: Endoscopy;  Laterality: N/A;  910    COSMETIC SURGERY  09/2007   ESOPHAGOGASTRODUODENOSCOPY N/A 10/18/2017   Procedure: ESOPHAGOGASTRODUODENOSCOPY (EGD);  Surgeon: Golda Claudis PENNER, MD;  [facility];  Service: Endoscopy;  Laterality: N/A;   ESOPHAGOGASTRODUODENOSCOPY N/A 07/06/2018   Procedure: ESOPHAGOGASTRODUODENOSCOPY (EGD);  Surgeon: Golda Claudis PENNER, MD;  [facility];  Service: Endoscopy;  Laterality: N/A;   GIVENS CAPSULE STUDY N/A 12/26/2019   Procedure: GIVENS CAPSULE STUDY;  Surgeon: Golda Claudis PENNER, MD;  [facility];  Service: Endoscopy;  Laterality: N/A;  730   LEFT HEART CATH AND CORONARY ANGIOGRAPHY N/A 01/21/2024   Procedure: LEFT HEART CATH AND CORONARY ANGIOGRAPHY;  Surgeon: Anner Alm ORN, MD;  Location: [facility];  Service: Cardiovascular;  Laterality: N/A;   PARTIAL COLECTOMY  2006   adenocarcinoma   POLYPECTOMY  04/26/2017   Procedure: POLYPECTOMY;  Surgeon: Golda Claudis PENNER, MD;  [facility];  Service: Endoscopy;;  colon   POLYPECTOMY  07/06/2018   Procedure: POLYPECTOMY;  Surgeon: Golda Claudis PENNER, MD;  [facility];  Service: Endoscopy;;  colon    POLYPECTOMY  09/10/2021   Procedure: POLYPECTOMY;  Surgeon: Golda Claudis PENNER, MD;  [facility];  Service: Endoscopy;;  transverse x2;sigmoid   TONSILLECTOMY      Family History  Problem Relation Age of Onset   Heart disease Mother    Stroke Mother    Heart attack Mother    Cancer Father        lung   Heart disease Father    Hypertension Father    Cancer Brother        rectal   Healthy Son    Heart failure Sister    Stroke Sister     Allergies as of 04/04/2024 - Review Complete 04/04/2024  Allergen Reaction Noted   Flecainide  Other (See Comments) 01/18/2024    Social History   Socioeconomic History   Marital status: Widowed    Spouse name: Not on file   Number of children: Not on file   Years of education: Not on file   Highest education level: Not on file   Occupational History   Occupation: Catering Manager  Tobacco Use   Smoking status: Former    Current packs/day: 0.00    Average packs/day: 0.3 packs/day for 40.0 years (12.0 ttl pk-yrs)    Types: Cigarettes    Start date: 02/23/1963    Quit date: 03/25/2002    Years since quitting: 22.0   Smokeless tobacco: Never   Tobacco comments:    Quit smoking in 2001  Vaping Use   Vaping status: Never Used  Substance and Sexual Activity   Alcohol use: No    Alcohol/week: 0.0 standard drinks of alcohol   Drug use: No   Sexual activity: Not Currently    Birth control/protection: Surgical, Abstinence  Other Topics Concern   Not on file  Social History Narrative   October 14, 2022: husband passed away ~ 5 weeks ago      Lives with husband in a one story home.  Has 3 children.  Retired from warehouse manager work.  Education: some college.   Social Drivers of Corporate Investment Banker Strain: Low Risk  (10-14-22)   Overall Financial Resource Strain (CARDIA)    Difficulty of Paying Living Expenses: Not hard at all  Food Insecurity: No Food Insecurity (01/15/2024)   Hunger Vital Sign    Worried About Running Out of Food in the Last Year: Never true    Ran Out of Food in the Last Year: Never true  Transportation Needs: No Transportation Needs (02/04/2024)   Received from [facility] - Transportation    Lack of Transportation (Medical): No    Lack of Transportation (Non-Medical): No  Physical Activity: Sufficiently Active (10-14-2022)   Exercise Vital Sign    Days of Exercise per Week: 7 days    Minutes of Exercise per Session: 30 min  Stress: No Stress Concern Present (  09/18/2022)   [employer] - Occupational Stress Questionnaire    Feeling of Stress : Not at all  Social Connections: Moderately Isolated (01/15/2024)   Social Connection and Isolation Panel    Frequency of Communication with Friends and Family: More than three times a week    Frequency of Social  Gatherings with Friends and Family: Twice a week    Attends Religious Services: More than 4 times per year    Active Member of [employer] or Organizations: No    Attends Banker Meetings: Never    Marital Status: Widowed   Review of Systems   Gen: Denies fever, chills, anorexia. Denies fatigue, weakness, weight loss.  CV: Denies chest pain, palpitations, syncope, peripheral edema, and claudication. Resp: Denies dyspnea at rest, cough, wheezing, coughing up blood, and pleurisy. GI: See HPI Derm: Denies rash, itching, dry skin Psych: Denies depression, anxiety, memory loss, confusion. No homicidal or suicidal ideation.  Heme: Denies bruising, bleeding, and enlarged lymph nodes.  Physical Exam   BP 97/62 (BP Location: Right Arm, Patient Position: Sitting, Cuff Size: Normal)   Pulse 73   Temp 97.7 F (36.5 C) (Temporal)   Ht 5' 2 (1.575 m)   Wt 101 lb 9.6 oz (46.1 kg)   BMI 18.58 kg/m   General:   Alert and oriented. Pleasant and cooperative.  Head:  Normocephalic. Bruising noted to left cheek, various area of healing.  Eyes:  Conjuctiva clear without scleral icterus. Abdomen:  +BS, soft, non-tender and non-distended. No rebound or guarding. No HSM or masses noted. Rectal: deferred Msk:  Symmetrical without gross deformities. Normal posture. Extremities:  Without edema. Thin extremities.  Neurologic:  Alert and  oriented x4 Psych:  Alert and cooperative. Normal mood and affect.  Assessment & Plan  [name] is a 83 y.o. female presenting today to discuss recent nausea and vomiting.    Nausea and vomiting, likely medication-induced Nausea and vomiting likely related to medication changes for atrial fibrillation (amiodarone ). Symptoms improved after reducing medication dosage and changing administration time to nighttime. Stress and recent fall may also contribute. Can not give zofran  or phenergan while concurrently tacking amiodarone  given drug interactions.  -  Continue current medication regimen with reduced dosage and nighttime administration. - Monitor symptoms and report if nausea worsens. - Continue PPI in case gerd playing a role.   Gastroesophageal reflux disease Well-controlled with over-the-counter omeprazole (Prilosec). - Continue over-the-counter omeprazole 20 mg daily.  Constipation, managed with lactulose  and dietary measures Constipation managed with lactulose  and dietary measures including prune juice and Miralax in coffee. Bowel movements are soft and regular. - Continue lactulose  10 mg daily as needed, or approximately three times a week as you have been using. - Maintain dietary measures with prune juice and Miralax.  Iron deficiency anemia Managed with daily over-the-counter iron supplementation. Most recent labs with stable Hgb.  - Continue daily over-the-counter iron supplementation.      Follow up   Follow up April 2026 to discuss risk vs benefit of colonoscopy pending overall health including cardiac stability.    Charmaine Melia, MSN, FNP-BC, AGACNP-BC [facility]  I have reviewed the note and agree with the APP's assessment as described in this progress note  Toribio Fortune, MD Gastroenterology and Hepatology [facility]

## 2024-04-17 ENCOUNTER — Other Ambulatory Visit: Payer: Self-pay | Admitting: Cardiology

## 2024-04-19 NOTE — Progress Notes (Signed)
 Cardiology Office Note    Date:  04/20/2024  ID:  Lisa, [name] 01-May-1941, MRN [id number] Cardiologist: Alvan Carrier, MD Cardiology APP:  Johnson Laymon HERO, PA-C { :  History of Present Illness:    [name] is a 83 y.o. female with past medical history of atypical chest pain (NST in 08/2017 without ischemia and cath in 01/2024 showing normal cors), paroxysmal atrial fibrillation (on Flecainide  previously but discontinued during admission in 01/2024 given episodes of wide-complex tachycardia and orthostasis), orthostatic hypotension, anemia and HLD who presents to the office today for 3-month follow-up.  She was examined by myself in 02/2024 and had been admitted to [facility] the previous month for ventricular tachycardia vs atrial fibrillation with aberrancy and rhythm was overall felt to be most consistent with atrial fibrillation per EP evaluation. Cardiac catheterization showed angiographically normal coronary arteries and cardiac MRI showed no evidence of infiltrative disease.  Flecainide  was discontinued and it was recommended to try low-dose Amiodarone  as she was not felt to be a candidate for Tikosyn given her prolonged QT. At the time of follow-up, she had returned home from rehab and was trying to increase her water  intake. She was continued on Amiodarone  200 mg daily along with Eliquis  2.5 mg twice daily for anticoagulation. Given no recurrent issues with lower extremity edema, Lasix  was discontinued and she was encouraged to only take this as needed for worsening weight gain or edema. Her blood pressure had been elevated at home and Midodrine  was reduced from 7.5 mg 3 times daily to 5 mg 3 times daily.  She contacted the office in 03/2024 reporting nausea and felt like symptoms were due to Amiodarone . It was recommended that she could reduce dosing to 100 mg daily and would take the medication with food.  In talking with the patient today, she reports that her nausea  did improve following dose reduction of Amiodarone  and she has not experienced any recurrent vomiting. Says that she still feels dizzy and has shakes in the morning but she feels better as the day progresses. By review of her blood pressure log, her BP is typically softer in the mornings and improves throughout the day.  She feels that Midodrine  could be contributing to her nausea as well. Breathing has been stable and no recent chest pain or palpitations. No specific orthopnea, PND or pitting edema and she has not had to utilize Lasix  recently.   Studies Reviewed:   EKG: EKG is not ordered today.   Cardiac Catheterization: 01/2024 Dominance: Right -> Angiographically Normal Coronary Arteries.  Normal LVEDP.   Nonischemic etiology for VT    RECOMMENDATIONS   Anticipated discharge date to be determined.   Recommend to resume Apixaban , at currently prescribed dose and frequency on 01/22/2024.   Could reinitiate Apixaban  as early as a.m. of 01/22/2024 versus placed back on IV heparin  pending further evaluation.  cMRI: 01/2024 IMPRESSION: 1. The left ventricle is normal in cavity size and wall thickness. Systolic function was normal with LVEF 59%. No regional wall motion abnormalities.   2. The right ventricle is normal in cavity size and systolic function.   3. No significant valvular heart disease.   4. Delayed enhancement imaging demonstrates no evidence of myocardial infarction, inflammation, or infiltrative disorder.   5. Mild bilateral pleural effusions noted.  Event Monitor: 02/2024   Patch wear time was for 13 days and 19 hours.   Normal sinus rhythm predominantly ranging from 47 to 143 bpm and average  HR 65 bpm.   18 runs of nonsustained SVT occurred, fastest interval lasting 4 beats and longest interval lasting 16 beats.   No evidence of atrial fibrillation/flutter, ventricular arrhythmias, high-grade AV block or pauses.   <1% PAC burden and <1% PVC burden.   Patient  triggered events correlated with NSR, 64 bpm and PAC.    Risk Assessment/Calculations:    CHA2DS2-VASc Score = 4  This indicates a 4.8% annual risk of stroke. The patient's score is based upon: CHF History: 0 HTN History: 1 Diabetes History: 0 Stroke History: 0 Vascular Disease History: 0 Age Score: 2 Gender Score: 1    Physical Exam:   VS:  BP 120/60 (BP Location: Left Arm, Patient Position: Sitting, Cuff Size: Normal)   Pulse (!) 56   Ht 5' 2 (1.575 m)   Wt 102 lb 9.6 oz (46.5 kg)   SpO2 95%   BMI 18.77 kg/m    Wt Readings from Last 3 Encounters:  04/20/24 102 lb 9.6 oz (46.5 kg)  04/04/24 101 lb 9.6 oz (46.1 kg)  03/27/24 101 lb (45.8 kg)     GEN: Pleasant, elderly female appearing in no acute distress NECK: No JVD; No carotid bruits CARDIAC: RRR, no murmurs, rubs, gallops RESPIRATORY:  Clear to auscultation without rales, wheezing or rhonchi  ABDOMEN: Appears non-distended. No obvious abdominal masses. EXTREMITIES: No clubbing or cyanosis. No pitting edema.  Distal pedal pulses are 2+ bilaterally.   Assessment and Plan:   1. PAF (paroxysmal atrial fibrillation) (HCC) - She denies any recent palpitations and is maintaining normal sinus rhythm by examination today. Flecainide  was previously discontinued during her recent admission and she was not felt to be a candidate for Tikosyn given her frail status and prolonged QT. Also not felt to be an ablation candidate. We have reduced Amiodarone  to 100 mg daily in the interim with improvement in her nausea and vomiting. I encouraged her to make us  aware if symptoms worsen as we would need to plan for EP referral for consideration of alternative options but these are clearly very limited. Would plan for follow-up LFT's and TSH at the time of her next office visit if not checked by her PCP in the interim given the current use of Amiodarone . She also needs to have an annual eye exam.  2. Current use of long term  anticoagulation - No reports of active bleeding. Continue Eliquis  2.5 mg twice daily for anticoagulation which is the correct dose given her current age (83 years old) and weight (102 lbs).  3. History of chest pain - Cardiac catheterization in 01/2024 showed angiographically normal coronary arteries. Continue with risk factor modification. She remains on Crestor  40 mg daily.  4. Orthostatic hypotension - BP is typically lower in the morning hours but does improve throughout the day and SBP has been in the 150's at times when checked in the afternoon. I did recommend that she reduce Midodrine  from 5 mg 3 times daily to 5 mg twice daily and take her initial dose upon waking in the morning to see if this helps with symptoms.  Signed, Laymon CHRISTELLA Qua, PA-C

## 2024-04-20 ENCOUNTER — Encounter: Payer: Self-pay | Admitting: Student

## 2024-04-20 ENCOUNTER — Ambulatory Visit: Attending: Student | Admitting: Student

## 2024-04-20 VITALS — BP 120/60 | HR 56 | Ht 62.0 in | Wt 102.6 lb

## 2024-04-20 DIAGNOSIS — I951 Orthostatic hypotension: Secondary | ICD-10-CM | POA: Diagnosis not present

## 2024-04-20 DIAGNOSIS — Z7901 Long term (current) use of anticoagulants: Secondary | ICD-10-CM | POA: Diagnosis not present

## 2024-04-20 DIAGNOSIS — Z87898 Personal history of other specified conditions: Secondary | ICD-10-CM | POA: Diagnosis not present

## 2024-04-20 DIAGNOSIS — I480 Paroxysmal atrial fibrillation: Secondary | ICD-10-CM | POA: Diagnosis not present

## 2024-04-20 MED ORDER — MIDODRINE HCL 5 MG PO TABS: 5.0000 mg | ORAL_TABLET | Freq: Two times a day (BID) | ORAL | 3 refills | Status: AC

## 2024-04-20 NOTE — Patient Instructions (Addendum)
 Medication Instructions:  Your physician has recommended you make the following change in your medication:  Change midodrine  to 5 mg twice daily Continue all other medications as prescribed  Labwork: none  Testing/Procedures: none  Follow-Up: Your physician recommends that you schedule a follow-up appointment in: 3-4 months  Any Other Special Instructions Will Be Listed Below (If Applicable).  If you need a refill on your cardiac medications before your next appointment, please call your pharmacy.

## 2024-05-09 ENCOUNTER — Telehealth: Payer: Self-pay | Admitting: Student

## 2024-05-09 MED ORDER — FUROSEMIDE 20 MG PO TABS: 20.0000 mg | ORAL_TABLET | Freq: Every day | ORAL | 1 refills | Status: AC | PRN

## 2024-05-09 NOTE — Telephone Encounter (Signed)
 Pt calling because she received a notification to refill her Lasix  from pharmacy but does not think she is supposed to be on it anymore. Please advise.

## 2024-05-09 NOTE — Telephone Encounter (Signed)
 02/23/24 office note AVS from B.Strader,PA-C  -Please weigh yourself every morning. Take a Lasix  tablet (20mg ) if weight increases by 2 pounds overnight or 5 pounds in a single week       Discussed provider instructions again with her and she verbalized understanding

## 2024-05-18 ENCOUNTER — Other Ambulatory Visit: Payer: Self-pay | Admitting: Family Medicine

## 2024-05-22 ENCOUNTER — Ambulatory Visit: Admitting: Family Medicine

## 2024-05-22 VITALS — BP 122/68 | HR 61 | Temp 97.9°F | Ht 62.0 in | Wt 104.4 lb

## 2024-05-22 DIAGNOSIS — Z79899 Other long term (current) drug therapy: Secondary | ICD-10-CM | POA: Diagnosis not present

## 2024-05-22 DIAGNOSIS — E7849 Other hyperlipidemia: Secondary | ICD-10-CM | POA: Diagnosis not present

## 2024-05-22 DIAGNOSIS — S069X0D Unspecified intracranial injury without loss of consciousness, subsequent encounter: Secondary | ICD-10-CM

## 2024-05-22 DIAGNOSIS — Z5181 Encounter for therapeutic drug level monitoring: Secondary | ICD-10-CM

## 2024-05-22 DIAGNOSIS — D539 Nutritional anemia, unspecified: Secondary | ICD-10-CM

## 2024-05-22 DIAGNOSIS — R7303 Prediabetes: Secondary | ICD-10-CM | POA: Diagnosis not present

## 2024-05-22 DIAGNOSIS — N183 Chronic kidney disease, stage 3 unspecified: Secondary | ICD-10-CM

## 2024-05-22 DIAGNOSIS — Z79891 Long term (current) use of opiate analgesic: Secondary | ICD-10-CM | POA: Diagnosis not present

## 2024-05-22 NOTE — Progress Notes (Signed)
 "  Subjective:    Patient ID: [name], female    DOB: 1941/02/03, 84 y.o.   MRN: [id number]  HPI Patient is in room 10:  Patient is here for a 8 week follow up per Dr.  This patient was seen today for chronic pain  The medication list was reviewed and updated.   Location of Pain for which the patient has been treated with regarding narcotics: Mid back pain low back pain  Onset of this pain: Present for years   -Compliance with medication: Good compliance with medicine  - Number patient states they take daily: 3 or 4 daily  -Reason for ongoing use of opioids does not get adequate relief with Tylenol  cannot take NSAIDs  What other measures have been tried outside of opioids Tylenol , NSAIDs, surgery  In the ongoing specialists regarding this condition back specialist  -when was the last dose patient took?  Past 24 hours  The patient was advised the importance of maintaining medication and not using illegal substances with these.  Here for refills and follow up  The patient was educated that we can provide 3 monthly scripts for their medication, it is their responsibility to follow the instructions.  Side effects or complications from medications: Denies side effects  Patient is aware that pain medications are meant to minimize the severity of the pain to allow their pain levels to improve to allow for better function. They are aware of that pain medications cannot totally remove their pain.    Scale of 1 to 10 ( 1 is least 10 is most) Your pain level without the medicine: 8-9 Your pain level with medication 4-5  Scale 1 to 10 ( 1-helps very little, 10 helps very well) How well does your pain medication reduce your pain so you can function better through out the day?  7  Quality of the pain: Throbbing aching  Persistence of the pain: Present all time  Modifying factors: Worse with activity       Review of Systems     Objective:   Physical  Exam General-in no acute distress Eyes-no discharge Lungs-respiratory rate normal, CTA CV-no murmurs,RRR Extremities skin warm dry no edema Neuro grossly normal Behavior normal, alert        Assessment & Plan:  1. CKD (chronic kidney disease), symptom management only, stage 3 (moderate) (HCC) (Primary) Stable Check labs await results  - Basic metabolic panel with GFR - Microalbumin/Creatinine Ratio, Urine  2. Macrocytic anemia Check labs await results - CBC with Differential  3. Prediabetes Portion control regular physical activity weight is stable  4. Other hyperlipidemia Check lipid profile continue medication - Lipid panel  5. High risk medication use Check labs continue medication Thyroid  testing necessary because she is on heart medicine cardiologist requested thyroid  testing - Hepatic function panel - T4, free - TSH  6. Traumatic brain injury, without loss of consciousness, subsequent encounter Patient had a serious fall last year took her several weeks to get over.  She is now walking with a cane she states her cyst balance is back to where it is been Denies any falls denies any passing out states her cognitive processing is doing well She feels that she is capable of driving her car just as well as she was capable previously She was able to ambulate down the hallway and back able to get up and out of a chair and sit back down without difficulty She was also able to know her name  to the place she was at and also know the date Cognitively she interacted well did not show deficits  I told her that I felt she could resume driving but local driving only and only during the daytime Also told her her first couple trips she needed to have a family member with her to ensure that she was able to operate car well if she was not able to do so she is to stop driving  7. Encounter for monitoring opioid maintenance therapy The patient was seen in followup for chronic  pain. A review over at their current pain status was discussed. Drug registry was checked. Prescriptions were given.  Regular follow-up recommended. Discussion was held regarding the importance of compliance with medication as well as pain medication contract.  Patient was informed that medication may cause drowsiness and should not be combined  with other medications/alcohol or street drugs. If the patient feels medication is causing altered alertness then do not drive or operate dangerous equipment.  Should be noted that the patient appears to be meeting appropriate use of opioids and response.  Evidenced by improved function and decent pain control without significant side effects and no evidence of overt aberrancy issues.  Upon discussion with the patient today they understand that opioid therapy is optional and they feel that the pain has been refractory to reasonable conservative measures and is significant and affecting quality of life enough to warrant ongoing therapy and wishes to continue opioids.  Refills were provided.  [place]  medical Board guidelines regarding the pain medicine has been reviewed.  CDC guidelines most updated 2022 has been reviewed by the prescriber.  PDMP is checked on a regular basis yearly urine drug screen and pain management contract  Treatment plan for this patient includes #1-gentle stretching exercises as shown daily basis 2.  Mild strength exercises 3 times per week #3 continue pain medications #4 notify us  if any digression Patient tolerates this dose well Does not show any signs of abuse  She will do her lab work we will let her know the result Patient will follow-up in 3 months "

## 2024-07-27 ENCOUNTER — Ambulatory Visit

## 2024-08-29 ENCOUNTER — Ambulatory Visit: Admitting: Family Medicine

## 2024-09-13 ENCOUNTER — Ambulatory Visit: Payer: Self-pay | Admitting: Student

## 2024-11-21 ENCOUNTER — Ambulatory Visit: Admitting: Family Medicine
